# Patient Record
Sex: Female | Born: 1960 | Race: White | Hispanic: No | Smoking: Former smoker
Health system: Southern US, Community
[De-identification: ages and names within clinical notes are randomized; demographics above are authoritative.]

## PROBLEM LIST (undated history)

## (undated) DIAGNOSIS — E119 Type 2 diabetes mellitus without complications: Secondary | ICD-10-CM

## (undated) DIAGNOSIS — M1990 Unspecified osteoarthritis, unspecified site: Secondary | ICD-10-CM

## (undated) DIAGNOSIS — I4891 Unspecified atrial fibrillation: Secondary | ICD-10-CM

## (undated) DIAGNOSIS — J449 Chronic obstructive pulmonary disease, unspecified: Secondary | ICD-10-CM

## (undated) DIAGNOSIS — I10 Essential (primary) hypertension: Secondary | ICD-10-CM

## (undated) DIAGNOSIS — F419 Anxiety disorder, unspecified: Secondary | ICD-10-CM

## (undated) DIAGNOSIS — I5022 Chronic systolic (congestive) heart failure: Secondary | ICD-10-CM

## (undated) DIAGNOSIS — I639 Cerebral infarction, unspecified: Secondary | ICD-10-CM

## (undated) DIAGNOSIS — F32A Depression, unspecified: Secondary | ICD-10-CM

## (undated) DIAGNOSIS — R21 Rash and other nonspecific skin eruption: Secondary | ICD-10-CM

## (undated) DIAGNOSIS — R51 Headache: Secondary | ICD-10-CM

## (undated) DIAGNOSIS — M869 Osteomyelitis, unspecified: Secondary | ICD-10-CM

## (undated) DIAGNOSIS — K219 Gastro-esophageal reflux disease without esophagitis: Secondary | ICD-10-CM

## (undated) DIAGNOSIS — I499 Cardiac arrhythmia, unspecified: Secondary | ICD-10-CM

## (undated) DIAGNOSIS — F329 Major depressive disorder, single episode, unspecified: Secondary | ICD-10-CM

## (undated) DIAGNOSIS — K8020 Calculus of gallbladder without cholecystitis without obstruction: Secondary | ICD-10-CM

## (undated) HISTORY — DX: Chronic obstructive pulmonary disease, unspecified: J44.9

## (undated) HISTORY — DX: Gastro-esophageal reflux disease without esophagitis: K21.9

## (undated) HISTORY — PX: COLONOSCOPY: SHX174

## (undated) HISTORY — PX: ECTOPIC PREGNANCY SURGERY: SHX613

## (undated) HISTORY — PX: CARPAL TUNNEL RELEASE: SHX101

---

## 1997-08-06 ENCOUNTER — Inpatient Hospital Stay (HOSPITAL_COMMUNITY): Admission: AD | Admit: 1997-08-06 | Discharge: 1997-08-08 | Payer: Self-pay | Admitting: *Deleted

## 1998-10-22 ENCOUNTER — Other Ambulatory Visit: Admission: RE | Admit: 1998-10-22 | Discharge: 1998-10-22 | Payer: Self-pay | Admitting: Family Medicine

## 1998-12-08 ENCOUNTER — Other Ambulatory Visit: Admission: RE | Admit: 1998-12-08 | Discharge: 1998-12-08 | Payer: Self-pay | Admitting: Family Medicine

## 1999-10-15 ENCOUNTER — Emergency Department (HOSPITAL_COMMUNITY): Admission: EM | Admit: 1999-10-15 | Discharge: 1999-10-15 | Payer: Self-pay | Admitting: Emergency Medicine

## 2000-01-19 ENCOUNTER — Emergency Department (HOSPITAL_COMMUNITY): Admission: EM | Admit: 2000-01-19 | Discharge: 2000-01-19 | Payer: Self-pay | Admitting: Emergency Medicine

## 2001-09-25 ENCOUNTER — Other Ambulatory Visit: Admission: RE | Admit: 2001-09-25 | Discharge: 2001-09-25 | Payer: Self-pay | Admitting: Family Medicine

## 2001-09-28 ENCOUNTER — Encounter: Admission: RE | Admit: 2001-09-28 | Discharge: 2001-09-28 | Payer: Self-pay | Admitting: Family Medicine

## 2001-09-28 ENCOUNTER — Encounter: Payer: Self-pay | Admitting: Family Medicine

## 2002-03-30 ENCOUNTER — Other Ambulatory Visit: Admission: RE | Admit: 2002-03-30 | Discharge: 2002-03-30 | Payer: Self-pay

## 2002-08-10 ENCOUNTER — Other Ambulatory Visit: Admission: RE | Admit: 2002-08-10 | Discharge: 2002-08-10 | Payer: Self-pay | Admitting: Obstetrics and Gynecology

## 2002-11-27 ENCOUNTER — Emergency Department (HOSPITAL_COMMUNITY): Admission: EM | Admit: 2002-11-27 | Discharge: 2002-11-28 | Payer: Self-pay | Admitting: Emergency Medicine

## 2002-11-28 ENCOUNTER — Encounter: Payer: Self-pay | Admitting: Emergency Medicine

## 2002-12-18 ENCOUNTER — Other Ambulatory Visit: Admission: RE | Admit: 2002-12-18 | Discharge: 2002-12-18 | Payer: Self-pay

## 2003-01-08 ENCOUNTER — Encounter: Admission: RE | Admit: 2003-01-08 | Discharge: 2003-01-08 | Payer: Self-pay

## 2003-05-29 ENCOUNTER — Emergency Department (HOSPITAL_COMMUNITY): Admission: AD | Admit: 2003-05-29 | Discharge: 2003-05-29 | Payer: Self-pay | Admitting: Family Medicine

## 2004-01-19 ENCOUNTER — Emergency Department (HOSPITAL_COMMUNITY): Admission: EM | Admit: 2004-01-19 | Discharge: 2004-01-19 | Payer: Self-pay | Admitting: *Deleted

## 2004-02-02 ENCOUNTER — Emergency Department (HOSPITAL_COMMUNITY): Admission: EM | Admit: 2004-02-02 | Discharge: 2004-02-02 | Payer: Self-pay | Admitting: Emergency Medicine

## 2004-05-29 ENCOUNTER — Emergency Department (HOSPITAL_COMMUNITY): Admission: EM | Admit: 2004-05-29 | Discharge: 2004-05-29 | Payer: Self-pay | Admitting: Family Medicine

## 2004-08-19 ENCOUNTER — Emergency Department (HOSPITAL_COMMUNITY): Admission: EM | Admit: 2004-08-19 | Discharge: 2004-08-19 | Payer: Self-pay | Admitting: Family Medicine

## 2005-01-30 ENCOUNTER — Emergency Department (HOSPITAL_COMMUNITY): Admission: EM | Admit: 2005-01-30 | Discharge: 2005-01-30 | Payer: Self-pay | Admitting: Emergency Medicine

## 2005-02-26 ENCOUNTER — Emergency Department (HOSPITAL_COMMUNITY): Admission: EM | Admit: 2005-02-26 | Discharge: 2005-02-26 | Payer: Self-pay | Admitting: Emergency Medicine

## 2005-09-24 ENCOUNTER — Other Ambulatory Visit: Admission: RE | Admit: 2005-09-24 | Discharge: 2005-09-24 | Payer: Self-pay | Admitting: Obstetrics and Gynecology

## 2005-09-25 ENCOUNTER — Emergency Department (HOSPITAL_COMMUNITY): Admission: EM | Admit: 2005-09-25 | Discharge: 2005-09-25 | Payer: Self-pay | Admitting: Family Medicine

## 2005-11-03 ENCOUNTER — Emergency Department (HOSPITAL_COMMUNITY): Admission: EM | Admit: 2005-11-03 | Discharge: 2005-11-03 | Payer: Self-pay | Admitting: Family Medicine

## 2006-09-04 ENCOUNTER — Emergency Department (HOSPITAL_COMMUNITY): Admission: EM | Admit: 2006-09-04 | Discharge: 2006-09-04 | Payer: Self-pay | Admitting: Emergency Medicine

## 2006-09-11 ENCOUNTER — Emergency Department (HOSPITAL_COMMUNITY): Admission: EM | Admit: 2006-09-11 | Discharge: 2006-09-11 | Payer: Self-pay | Admitting: Family Medicine

## 2006-10-17 ENCOUNTER — Other Ambulatory Visit: Admission: RE | Admit: 2006-10-17 | Discharge: 2006-10-17 | Payer: Self-pay | Admitting: Obstetrics and Gynecology

## 2006-11-14 ENCOUNTER — Emergency Department (HOSPITAL_COMMUNITY): Admission: EM | Admit: 2006-11-14 | Discharge: 2006-11-15 | Payer: Self-pay | Admitting: Emergency Medicine

## 2006-11-15 ENCOUNTER — Encounter: Admission: RE | Admit: 2006-11-15 | Discharge: 2006-11-15 | Payer: Self-pay | Admitting: Obstetrics and Gynecology

## 2006-11-18 ENCOUNTER — Encounter: Admission: RE | Admit: 2006-11-18 | Discharge: 2006-11-18 | Payer: Self-pay | Admitting: Obstetrics and Gynecology

## 2007-04-25 ENCOUNTER — Encounter: Admission: RE | Admit: 2007-04-25 | Discharge: 2007-04-25 | Payer: Self-pay | Admitting: Family Medicine

## 2007-04-26 ENCOUNTER — Encounter: Admission: RE | Admit: 2007-04-26 | Discharge: 2007-04-26 | Payer: Self-pay | Admitting: Family Medicine

## 2007-06-12 ENCOUNTER — Emergency Department (HOSPITAL_COMMUNITY): Admission: EM | Admit: 2007-06-12 | Discharge: 2007-06-12 | Payer: Self-pay | Admitting: Family Medicine

## 2008-05-02 ENCOUNTER — Other Ambulatory Visit: Admission: RE | Admit: 2008-05-02 | Discharge: 2008-05-02 | Payer: Self-pay | Admitting: Obstetrics and Gynecology

## 2008-06-01 ENCOUNTER — Emergency Department (HOSPITAL_COMMUNITY): Admission: EM | Admit: 2008-06-01 | Discharge: 2008-06-01 | Payer: Self-pay | Admitting: Family Medicine

## 2008-09-24 ENCOUNTER — Emergency Department (HOSPITAL_COMMUNITY): Admission: EM | Admit: 2008-09-24 | Discharge: 2008-09-25 | Payer: Self-pay | Admitting: Emergency Medicine

## 2008-10-26 ENCOUNTER — Emergency Department (HOSPITAL_COMMUNITY): Admission: EM | Admit: 2008-10-26 | Discharge: 2008-10-26 | Payer: Self-pay | Admitting: Emergency Medicine

## 2008-12-08 ENCOUNTER — Inpatient Hospital Stay (HOSPITAL_COMMUNITY): Admission: AD | Admit: 2008-12-08 | Discharge: 2008-12-08 | Payer: Self-pay | Admitting: Obstetrics and Gynecology

## 2009-01-08 ENCOUNTER — Emergency Department (HOSPITAL_COMMUNITY): Admission: EM | Admit: 2009-01-08 | Discharge: 2009-01-08 | Payer: Self-pay | Admitting: Family Medicine

## 2009-01-18 ENCOUNTER — Inpatient Hospital Stay (HOSPITAL_COMMUNITY): Admission: AD | Admit: 2009-01-18 | Discharge: 2009-01-19 | Payer: Self-pay | Admitting: Obstetrics and Gynecology

## 2009-01-30 ENCOUNTER — Encounter: Admission: RE | Admit: 2009-01-30 | Discharge: 2009-01-30 | Payer: Self-pay | Admitting: Obstetrics and Gynecology

## 2009-01-30 ENCOUNTER — Observation Stay (HOSPITAL_COMMUNITY): Admission: EM | Admit: 2009-01-30 | Discharge: 2009-01-31 | Payer: Self-pay | Admitting: Emergency Medicine

## 2009-01-31 ENCOUNTER — Inpatient Hospital Stay (HOSPITAL_COMMUNITY): Admission: EM | Admit: 2009-01-31 | Discharge: 2009-02-04 | Payer: Self-pay | Admitting: Emergency Medicine

## 2009-02-04 ENCOUNTER — Encounter (INDEPENDENT_AMBULATORY_CARE_PROVIDER_SITE_OTHER): Payer: Self-pay | Admitting: Internal Medicine

## 2009-02-22 ENCOUNTER — Ambulatory Visit: Payer: Self-pay | Admitting: Cardiology

## 2009-02-23 ENCOUNTER — Inpatient Hospital Stay (HOSPITAL_COMMUNITY): Admission: EM | Admit: 2009-02-23 | Discharge: 2009-02-25 | Payer: Self-pay | Admitting: Emergency Medicine

## 2009-02-25 ENCOUNTER — Emergency Department (HOSPITAL_COMMUNITY): Admission: EM | Admit: 2009-02-25 | Discharge: 2009-02-26 | Payer: Self-pay | Admitting: Emergency Medicine

## 2009-03-05 ENCOUNTER — Encounter: Admission: RE | Admit: 2009-03-05 | Discharge: 2009-03-05 | Payer: Self-pay | Admitting: Family Medicine

## 2009-04-05 ENCOUNTER — Inpatient Hospital Stay (HOSPITAL_COMMUNITY): Admission: EM | Admit: 2009-04-05 | Discharge: 2009-04-07 | Payer: Self-pay | Admitting: Emergency Medicine

## 2009-05-16 ENCOUNTER — Emergency Department (HOSPITAL_COMMUNITY): Admission: EM | Admit: 2009-05-16 | Discharge: 2009-05-16 | Payer: Self-pay | Admitting: Emergency Medicine

## 2009-05-29 ENCOUNTER — Emergency Department (HOSPITAL_COMMUNITY): Admission: EM | Admit: 2009-05-29 | Discharge: 2009-05-29 | Payer: Self-pay | Admitting: Emergency Medicine

## 2009-07-15 ENCOUNTER — Emergency Department (HOSPITAL_COMMUNITY): Admission: EM | Admit: 2009-07-15 | Discharge: 2009-07-15 | Payer: Self-pay | Admitting: Emergency Medicine

## 2009-10-01 ENCOUNTER — Emergency Department (HOSPITAL_COMMUNITY): Admission: EM | Admit: 2009-10-01 | Discharge: 2009-10-01 | Payer: Self-pay | Admitting: Emergency Medicine

## 2009-12-08 ENCOUNTER — Emergency Department (HOSPITAL_COMMUNITY): Admission: EM | Admit: 2009-12-08 | Discharge: 2009-12-08 | Payer: Self-pay | Admitting: Emergency Medicine

## 2009-12-22 ENCOUNTER — Encounter: Admission: RE | Admit: 2009-12-22 | Discharge: 2010-02-18 | Payer: Self-pay | Admitting: Orthopedic Surgery

## 2010-02-02 ENCOUNTER — Encounter: Admission: RE | Admit: 2010-02-02 | Discharge: 2010-02-02 | Payer: Self-pay | Admitting: Obstetrics and Gynecology

## 2010-02-05 ENCOUNTER — Encounter: Admission: RE | Admit: 2010-02-05 | Discharge: 2010-02-05 | Payer: Self-pay | Admitting: Obstetrics and Gynecology

## 2010-02-12 ENCOUNTER — Other Ambulatory Visit: Admission: RE | Admit: 2010-02-12 | Discharge: 2010-02-12 | Payer: Self-pay | Admitting: Obstetrics and Gynecology

## 2010-02-21 ENCOUNTER — Emergency Department (HOSPITAL_COMMUNITY): Admission: EM | Admit: 2010-02-21 | Discharge: 2010-02-21 | Payer: Self-pay | Admitting: Emergency Medicine

## 2010-03-10 ENCOUNTER — Emergency Department (HOSPITAL_COMMUNITY): Admission: EM | Admit: 2010-03-10 | Discharge: 2010-03-11 | Payer: Self-pay | Admitting: Emergency Medicine

## 2010-03-24 ENCOUNTER — Encounter: Admission: RE | Admit: 2010-03-24 | Discharge: 2010-03-24 | Payer: Self-pay | Admitting: Family Medicine

## 2010-04-20 ENCOUNTER — Emergency Department (HOSPITAL_COMMUNITY): Admission: EM | Admit: 2010-04-20 | Discharge: 2010-04-20 | Payer: Self-pay | Admitting: Emergency Medicine

## 2010-06-27 ENCOUNTER — Emergency Department (HOSPITAL_COMMUNITY)
Admission: EM | Admit: 2010-06-27 | Discharge: 2010-06-27 | Payer: Self-pay | Source: Home / Self Care | Admitting: Emergency Medicine

## 2010-07-19 ENCOUNTER — Encounter: Payer: Self-pay | Admitting: Obstetrics and Gynecology

## 2010-10-01 LAB — URINALYSIS, ROUTINE W REFLEX MICROSCOPIC
Glucose, UA: 100 mg/dL — AB
Nitrite: NEGATIVE
Protein, ur: NEGATIVE mg/dL
Urobilinogen, UA: 1 mg/dL (ref 0.0–1.0)

## 2010-10-01 LAB — COMPREHENSIVE METABOLIC PANEL
AST: 15 U/L (ref 0–37)
Alkaline Phosphatase: 73 U/L (ref 39–117)
BUN: 4 mg/dL — ABNORMAL LOW (ref 6–23)
CO2: 26 mEq/L (ref 19–32)
Chloride: 105 mEq/L (ref 96–112)
Creatinine, Ser: 0.53 mg/dL (ref 0.4–1.2)
GFR calc Af Amer: >60 mL/min (ref >60)
GFR calc non Af Amer: >60 mL/min (ref >60)
Total Bilirubin: 0.5 mg/dL (ref 0.3–1.2)

## 2010-10-01 LAB — POCT I-STAT, CHEM 8
HCT: 41 % (ref 36.0–46.0)
Hemoglobin: 13.9 g/dL (ref 12.0–15.0)
Sodium: 138 mEq/L (ref 135–145)
TCO2: 26 mmol/L (ref 0–100)

## 2010-10-01 LAB — DIFFERENTIAL
Basophils Relative: 2 % — ABNORMAL HIGH (ref 0–1)
Eosinophils Absolute: 0.4 10*3/uL (ref 0.0–0.7)
Lymphs Abs: 2.1 10*3/uL (ref 0.7–4.0)
Neutrophils Relative %: 74 % (ref 43–77)

## 2010-10-01 LAB — CBC
HCT: 37.9 % (ref 36.0–46.0)
MCHC: 32.2 g/dL (ref 30.0–36.0)
MCV: 76.4 fL — ABNORMAL LOW (ref 78.0–100.0)
MCV: 78 fL (ref 78.0–100.0)
Platelets: 180 10*3/uL (ref 150–400)
Platelets: 194 10*3/uL (ref 150–400)
RBC: 4.73 MIL/uL (ref 3.87–5.11)
RBC: 4.86 MIL/uL (ref 3.87–5.11)
WBC: 12.5 10*3/uL — ABNORMAL HIGH (ref 4.0–10.5)
WBC: 12.6 10*3/uL — ABNORMAL HIGH (ref 4.0–10.5)

## 2010-10-01 LAB — CULTURE, BLOOD (ROUTINE X 2)

## 2010-10-03 LAB — CBC
HCT: 34.7 % — ABNORMAL LOW (ref 36.0–46.0)
HCT: 35.9 % — ABNORMAL LOW (ref 36.0–46.0)
HCT: 34.5 % — ABNORMAL LOW (ref 36.0–46.0)
Hemoglobin: 11.2 g/dL — ABNORMAL LOW (ref 12.0–15.0)
Hemoglobin: 13 g/dL (ref 12.0–15.0)
MCHC: 31.7 g/dL (ref 30.0–36.0)
MCHC: 32 g/dL (ref 30.0–36.0)
MCHC: 32.2 g/dL (ref 30.0–36.0)
MCHC: 31.8 g/dL (ref 30.0–36.0)
MCHC: 32 g/dL (ref 30.0–36.0)
MCV: 77.2 fL — ABNORMAL LOW (ref 78.0–100.0)
MCV: 77.2 fL — ABNORMAL LOW (ref 78.0–100.0)
MCV: 77.5 fL — ABNORMAL LOW (ref 78.0–100.0)
MCV: 78 fL (ref 78.0–100.0)
Platelets: 168 10*3/uL (ref 150–400)
Platelets: 135 10*3/uL — ABNORMAL LOW (ref 150–400)
Platelets: 137 10*3/uL — ABNORMAL LOW (ref 150–400)
Platelets: 157 10*3/uL (ref 150–400)
RBC: 4.48 MIL/uL (ref 3.87–5.11)
RBC: 4.65 MIL/uL (ref 3.87–5.11)
RBC: 5.17 MIL/uL — ABNORMAL HIGH (ref 3.87–5.11)
RBC: 5.27 MIL/uL — ABNORMAL HIGH (ref 3.87–5.11)
RBC: 4.41 MIL/uL (ref 3.87–5.11)
RDW: 16.9 % — ABNORMAL HIGH (ref 11.5–15.5)
RDW: 17.1 % — ABNORMAL HIGH (ref 11.5–15.5)
WBC: 6.6 10*3/uL (ref 4.0–10.5)
WBC: 7.4 10*3/uL (ref 4.0–10.5)
WBC: 8.4 10*3/uL (ref 4.0–10.5)
WBC: 9.4 10*3/uL (ref 4.0–10.5)

## 2010-10-03 LAB — BASIC METABOLIC PANEL
BUN: 10 mg/dL (ref 6–23)
BUN: 6 mg/dL (ref 6–23)
BUN: 9 mg/dL (ref 6–23)
CO2: 25 mEq/L (ref 19–32)
CO2: 25 mEq/L (ref 19–32)
CO2: 25 mEq/L (ref 19–32)
CO2: 25 mEq/L (ref 19–32)
Calcium: 9.1 mg/dL (ref 8.4–10.5)
Calcium: 9.1 mg/dL (ref 8.4–10.5)
Chloride: 106 mEq/L (ref 96–112)
Chloride: 106 mEq/L (ref 96–112)
Chloride: 103 mEq/L (ref 96–112)
Chloride: 108 mEq/L (ref 96–112)
Creatinine, Ser: 0.6 mg/dL (ref 0.4–1.2)
Creatinine, Ser: 0.57 mg/dL (ref 0.4–1.2)
Creatinine, Ser: 0.61 mg/dL (ref 0.4–1.2)
GFR calc Af Amer: >60 mL/min (ref >60)
GFR calc Af Amer: >60 mL/min (ref >60)
GFR calc Af Amer: >60 mL/min (ref >60)
GFR calc Af Amer: >60 mL/min (ref >60)
GFR calc non Af Amer: >60 mL/min (ref >60)
Glucose, Bld: 190 mg/dL — ABNORMAL HIGH (ref 70–99)
Glucose, Bld: 240 mg/dL — ABNORMAL HIGH (ref 70–99)
Potassium: 3.8 mEq/L (ref 3.5–5.1)
Potassium: 3.2 mEq/L — ABNORMAL LOW (ref 3.5–5.1)
Sodium: 139 mEq/L (ref 135–145)
Sodium: 140 mEq/L (ref 135–145)
Sodium: 136 mEq/L (ref 135–145)

## 2010-10-03 LAB — CARDIAC PANEL(CRET KIN+CKTOT+MB+TROPI)
CK, MB: 1.1 ng/mL (ref 0.3–4.0)
CK, MB: 1.2 ng/mL (ref 0.3–4.0)
Relative Index: INVALID (ref 0.0–2.5)
Relative Index: INVALID (ref 0.0–2.5)
Relative Index: INVALID (ref 0.0–2.5)
Total CK: 32 U/L (ref 7–177)
Total CK: 26 U/L (ref 7–177)
Total CK: 31 U/L (ref 7–177)
Troponin I: 0.01 ng/mL (ref 0.00–0.06)
Troponin I: 0.02 ng/mL (ref 0.00–0.06)

## 2010-10-03 LAB — POCT CARDIAC MARKERS
CKMB, poc: <1 ng/mL — ABNORMAL LOW (ref 1.0–8.0)
CKMB, poc: <1 ng/mL — ABNORMAL LOW (ref 1.0–8.0)
Myoglobin, poc: 44.6 ng/mL (ref 12–200)
Myoglobin, poc: 51.7 ng/mL (ref 12–200)
Troponin i, poc: <0.05 ng/mL (ref 0.00–0.09)

## 2010-10-03 LAB — T4, FREE: Free T4: 0.88 ng/dL (ref 0.80–1.80)

## 2010-10-03 LAB — DIFFERENTIAL
Basophils Absolute: 0 10*3/uL (ref 0.0–0.1)
Basophils Relative: 0 % (ref 0–1)
Eosinophils Relative: 0 % (ref 0–5)
Lymphocytes Relative: 34 % (ref 12–46)
Lymphocytes Relative: 6 % — ABNORMAL LOW (ref 12–46)
Lymphs Abs: 2.9 10*3/uL (ref 0.7–4.0)
Monocytes Absolute: 0.5 10*3/uL (ref 0.1–1.0)
Monocytes Relative: 6 % (ref 3–12)
Neutro Abs: 4.6 10*3/uL (ref 1.7–7.7)
Neutro Abs: 10.4 10*3/uL — ABNORMAL HIGH (ref 1.7–7.7)
Neutrophils Relative %: 55 % (ref 43–77)

## 2010-10-03 LAB — GLUCOSE, CAPILLARY
Glucose-Capillary: 209 mg/dL — ABNORMAL HIGH (ref 70–99)
Glucose-Capillary: 238 mg/dL — ABNORMAL HIGH (ref 70–99)
Glucose-Capillary: 391 mg/dL — ABNORMAL HIGH (ref 70–99)

## 2010-10-03 LAB — HEPARIN LEVEL (UNFRACTIONATED)
Heparin Unfractionated: 0.44 IU/mL (ref 0.30–0.70)
Heparin Unfractionated: 0.53 IU/mL (ref 0.30–0.70)

## 2010-10-03 LAB — BRAIN NATRIURETIC PEPTIDE: Pro B Natriuretic peptide (BNP): <30 pg/mL (ref 0.0–100.0)

## 2010-10-03 LAB — PROTIME-INR: Prothrombin Time: 13.3 seconds (ref 11.6–15.2)

## 2010-10-03 LAB — T3: T3, Total: 84.8 ng/dl (ref 80.0–204.0)

## 2010-10-03 LAB — TSH: TSH: 0.151 u[IU]/mL — ABNORMAL LOW (ref 0.350–4.500)

## 2010-10-03 LAB — PREGNANCY, URINE: Preg Test, Ur: NEGATIVE

## 2010-10-04 LAB — URINALYSIS, ROUTINE W REFLEX MICROSCOPIC
Glucose, UA: NEGATIVE mg/dL
Hgb urine dipstick: NEGATIVE
Ketones, ur: NEGATIVE mg/dL
Protein, ur: 30 mg/dL — AB
pH: 5.5 (ref 5.0–8.0)

## 2010-10-04 LAB — POCT RAPID STREP A (OFFICE): Streptococcus, Group A Screen (Direct): NEGATIVE

## 2010-10-04 LAB — URINE MICROSCOPIC-ADD ON: RBC / HPF: NONE SEEN RBC/hpf (ref <3)

## 2010-10-05 LAB — CBC
Hemoglobin: 13 g/dL (ref 12.0–15.0)
MCHC: 33.2 g/dL (ref 30.0–36.0)
Platelets: 150 10*3/uL (ref 150–400)
RDW: 19.6 % — ABNORMAL HIGH (ref 11.5–15.5)

## 2010-10-05 LAB — POCT PREGNANCY, URINE: Preg Test, Ur: NEGATIVE

## 2010-10-06 LAB — URINALYSIS, ROUTINE W REFLEX MICROSCOPIC
Glucose, UA: NEGATIVE mg/dL
Hgb urine dipstick: NEGATIVE
Ketones, ur: NEGATIVE mg/dL
pH: 6 (ref 5.0–8.0)

## 2010-10-06 LAB — CBC
HCT: 38.5 % (ref 36.0–46.0)
Hemoglobin: 12.6 g/dL (ref 12.0–15.0)
MCV: 75.3 fL — ABNORMAL LOW (ref 78.0–100.0)
RDW: 19.2 % — ABNORMAL HIGH (ref 11.5–15.5)

## 2010-10-06 LAB — URINE CULTURE

## 2010-10-06 LAB — POCT CARDIAC MARKERS
CKMB, poc: <1 ng/mL — ABNORMAL LOW (ref 1.0–8.0)
Troponin i, poc: <0.05 ng/mL (ref 0.00–0.09)

## 2010-11-10 NOTE — Discharge Summary (Signed)
[name], [name]                ACCOUNT NO.:  [id number]   MEDICAL RECORD NO.:  [id number]          PATIENT TYPE:  INP   LOCATION:  5531                         FACILITY:  [facility]   PHYSICIAN:  Kela Millin, M.D.DATE OF BIRTH:  Oct 25, 1960   DATE OF ADMISSION:  01/31/2009  DATE OF DISCHARGE:  02/04/2009                               DISCHARGE SUMMARY   ADMITTING PHYSICIAN:  Gordy Savers, MD   CONSULTANTS:  Francisca December, MD, Cardiology.   CHIEF COMPLAINT/REASON FOR ADMISSION:  Ms. Cornman is a 50 year old  female patient with history of tobacco abuse for 20 years and no other  history of COPD or asthma.  Two weeks prior to presentation, she  developed paroxysmal coughing associated with chest congestion and  refractory nonproductive cough.  Initially, this symptomatology was not  associated with chest pain or shortness of breath.  Over the past  several days, the cough has worsened and associated with anterior chest  wall pain with nonproductive cough.  No fevers, no chills.  Subsequently, developing wheezing and mild shortness of breath.  She  presented to the ER 24 hours prior to admission and was given an  injection of steroids and albuterol metered-dose inhaler.  She had  previously been treated as an outpatient with oral azithromycin.  She  saw her primary care physician 2 days prior to admission and was  retreated with azithromycin.  She had just been discharged from the  hospital earlier on the date of admission, i.e. at 6 a.m. and was  stable, but by 5:30 that same afternoon, she represented back to the  hospital where she was evaluated by the EDP and then subsequently, Dr.  Amador Cunas.  She was aggressively IV fluid hydrated.  Bronchodilators  were given via inhaler and she was given IV steroids.  She continued to  have refractory cough and persistent wheezing; therefore, was admitted  by Dr. Amador Cunas.  Upon initial evaluation, she was afebrile, she  was  non-hypertensive, pulse was 110, respiratory rate 24, O2 sat 98% on room  air.  She was found to be without peripheral edema.  She had a soft  grade 1-2/6 systolic murmur.  She was noted to have coarse scattered  rhonchi and expiratory wheezing bilaterally, no resting tachypnea.  She  was admitted by Dr. Amador Cunas with the following diagnoses.  1. Acute exacerbation of asthmatic bronchitis.  2. Tobacco abuse.   HOSPITAL COURSE:  The patient was admitted to the general floor  hospital, was started on IV fluids, IV steroids, and initially started  on azithromycin and this was later changed to Avelox.  She was also  started on Xopenex nebs and guaifenesin antitussive medications.   Early in the hospitalization, the patient was complaining of  palpitations, which became progressively worse.  She was placed on a  telemetry monitor and was noted to have some sinus tachycardia.  In  addition, over a period of day, she was also found to have asymptomatic  bradycardia with heart rates down into the mid to upper 30s.   From respiratory standpoint, she improved with administration  of  antibiotics and steroids.  She was eventually transitioned over to oral  steroids and oral antibiotics.  She was able to slowly increase her  activity level without reproduction of paroxysmal coughing or wheezing.  She was otherwise resumed on her usual home medications.   She continued to have complaints of tachy palpitations as evidenced by  occasional tachycardia, but mostly, she remained in bradycardic state,  especially with sleep.  The bradycardia was asymptomatic.  Because of  these issues, TSH was checked, this was 0.151.  The patient had no other  external signs of hyperthyroidism such as exophthalmos or skin or hair  changes.  Free T4 was checked and this was normal at 0.88.  T3 was  checked and it was 84.8.  Because she was also complaining of some  transient chest pain, which was probably  musculoskeletal in nature,  cardiac isoenzymes have been checked early in hospitalization and these  were normal.  Because of the tachycardia, 2D echocardiogram has been  ordered this hospitalization and at time of dictation, this had not been  read by a physician.   On date of dictation, Dr. Amil Amen of Cardiology also evaluated the  patient because of the bradycardia.  He will follow up on the 2D echo  and he has arranged for outpatient 24-hour monitor and his discussion  with her, the patient has apparently had symptomatic tachycardia, which  now is evidenced this bradycardia.  She has had tachy palpitations and  lightheadedness with one episode of near syncope, this has been ongoing  for about 4-6 months; therefore, warranting the outpatient Holter  monitor.  He feels at this time she does not have any evidence to  warrant further ischemic workup such as stress test and he recommends  her to follow up in the office for additional workup.  The patient has  already arranged for that appointment prior to discharge.   In regards to her pulmonary issues, on date of discharge, the patient  was ambulating in the hall without shortness of breath and paroxysmal  coughing.  She has been transitioned over to oral Avelox as previously  mentioned and will continue this for 3 more days.  She has been  transitioned over to oral prednisone and as of today, she was on 20  b.i.d. and we will change this to 40 daily and transition down by 10 mg  every 3 days until she has completed the dose of a total of 10 tablets.  She also has been given over-the-counter guaifenesin recommendations and  Prilosec prescription to use as well for any reflux-type symptoms.  Dr.  Suanne Marker also recommends repeating TSH in 4-6 weeks to determine if this  has returned to a normal baseline.  Otherwise, additional workup will be  indicated with the patient's primary care physician, Dr. Lupe Carney.   FINAL DISCHARGE  DIAGNOSES:  1. Acute asthmatic bronchitis improved without evidence of pneumonia.  2. Tachy palpitations and bradycardia.  Outpatient cardiac workup      pending.  3. Tobacco abuse.  The patient reports, will stop as of this      admission.  4. Low TSH with normal T3-T4, probably acute phase reactant.  5. History of anxiety disorder and depression on medications prior to      admission.   DISCHARGE MEDICATIONS:  The patient will resume the following home  medications.  1. Xanax 0.25 mg p.r.n. anxiety.  2. Prozac 20 mg daily.  3. Prempro 0.3 mg daily.  New medications include,  1. Prilosec 20 mg daily.  2. Over-the-counter guaifenesin/Robitussin cough.  3. Avelox 400 mg daily for the next 3 days.  4. Prednisone taper 10 mg tablets, instructions to take 4 tabs daily      for 3 days and 3 tabs daily for 3 days, and 2 tabs daily for 3      days, and 1 tab daily for 3 days, then to stop.   Return for work in 2 weeks.   DIET:  Avoid dairy products until stomach improves.   ACTIVITY:  Otherwise, no restrictions.  I did caution the patient about  driving or operating any type of heavy machinery when experiencing  palpitations or dizziness as she has had near-syncopal episode.   FOLLOWUP:  She needs to call her primary care physician, Dr. Clovis Riley in  2 weeks to be seen.  In addition, she needs to follow up with Dr.  Amil Amen as instructed or contact person to assure, their telephone  number is [phone].  In addition, she has been given instructions from  the smoking cessation person here at the hospital, their telephone  number is [phone].      Allison L. Rennis Harding, N.P.      Kela Millin, M.D.  Electronically Signed    ALE/MEDQ  D:  02/04/2009  T:  02/05/2009  Job:  [id number]   cc:   Francisca December, M.D.  Elsworth Soho, M.D.

## 2010-11-10 NOTE — H&P (Signed)
NAMESEVANNAH, [name]                ACCOUNT NO.:  [id number]   MEDICAL RECORD NO.:  [id number]           PATIENT TYPE:   LOCATION:                                FACILITY:  [facility]   PHYSICIAN:  Charles A. Delcambre, MDDATE OF BIRTH:  10-09-1960   DATE OF ADMISSION:  12/07/2006  DATE OF DISCHARGE:                              HISTORY & PHYSICAL   She is admitted June 11 to undergo endometrial ablation, planned  ThermaChoice.  If failure of Thermachoice, may proceed with rollerball  ablation.  She is a 50 year old gravida 94, para 3-0-10-3 with  menorrhagia and desiring ablation.   PAST MEDICAL HISTORY:  Abnormal Pap with LEEP done by Dr. Galen Daft showing  moderate dysplasia, positive endocervical margins; this was back in  2003.  Negative Pap in October 2003, negative patient February 2004,  ASCUS Pap June 2005, and then a Pap smear October 17, 2006, negative.  Recently treated for positive Staph aureus methicillin-resistant lesions  around a tattoo area on her leg with, I believe, doxycycline, Dr.  Clovis Riley for 2 weeks.   SURGICAL HISTORY:  LEEP only.   MEDICATIONS:  Prozac 20 mg a day; Xanax at a nonspecified dose p.r.n.,  rarely used.   ALLERGIES:  PENICILLIN, ASPIRIN, SULFA, CODEINE.  REACTIONS  NONSPECIFIED.   SOCIAL HISTORY:  Smoking about quarter-pack a day of cigarettes at this  time.  She denies alcohol use, drug use, or STD exposure in the past  other than she has had Trichomonas treated.  She is back together with  her husband.   FAMILY HISTORY:  Father is deceased at age 63 from cancer, nonspecified;  he had diabetes also.  Mother in mid 100s with hypertension and diabetes.  Sister with breast cancer, onset age 104 as noted.  Otherwise, no family  history of uterus, ovaries, cervix, colon cancer, lymphoma, coronary  artery disease, or stroke.   REVIEW OF SYSTEMS:  Denies fever or chills.  No rashes or lesions,  headaches, dizziness.  Some seasonal allergies.  No chest  pain,  shortness of breath, wheezing, diarrhea, constipation, bleeding, melena,  hematochezia, urgency, frequency, dysuria, incontinence, hematuria,  __________  or emotional changes.   PHYSICAL EXAMINATION:  GENERAL:  As noted.  VITALS:  As noted.  NEUROLOGIC:  Alert and oriented x3.  HEENT EXAM:  Grossly within normal limits.  NECK:  Supple without thyromegaly or adenopathy.  LUNGS:  Clear bilaterally.  BACK:  No CVAT, vertebral column nontender to palpation.  BREASTS:  No masses, tenderness, discharge, skin or nipple changes  bilaterally.  ABDOMEN:  Soft, flat, nontender.  No hepatosplenomegaly or masses noted.  HEART:  Regular rate and rhythm without murmur, rub, or gallop.  PELVIC EXAM:  Normal external female genitalia, Bartholin, urethral, or  Skene's within normal limits.  Vault without discharge, multiparus  cervix is noted and uterus is not enlarged.  Adnexa nontender without  masses.  Ovaries are  within normal size bilaterally.  RECTAL:  Exam is not done.  Anus and perineal body appeared normal.   ASSESSMENT:  Menorrhagia.   PLAN:  ThermaChoice endometrial ablation  or resorting to rolloeball  endometrial ablation if necessary.  All questions were answered, failed  procedure, eumenorrhea for 80-90%, amenorrhea for 30-40%.  Quotes were  given per my experience, ThermaChoice.  All questions were answered.  She accepts risks on uterine perforation, damage to bowel or bladder.  She will remain n.p.o. past midnight evening prior to surgery.  Preoperative CBC and serum hCG will be obtained prior to surgery and we  will proceed as outlined.      Charles A. Sydnee Cabal, MD  Electronically Signed     CAD/MEDQ  D:  11/30/2006  T:  12/01/2006  Job:  [id number]

## 2010-11-10 NOTE — Cardiovascular Report (Signed)
NAMEADDYSIN, [name]                ACCOUNT NO.:  [id number]   MEDICAL RECORD NO.:  [id number]          PATIENT TYPE:  INP   LOCATION:  2003                         FACILITY:  [facility]   PHYSICIAN:  Corky Crafts, MDDATE OF BIRTH:  09/03/1960   DATE OF PROCEDURE:  DATE OF DISCHARGE:                            CARDIAC CATHETERIZATION   REFERRING PHYSICIAN:  Francisca December, MD   PROCEDURE PERFORMED:  Left heart catheterization, left ventriculogram,  coronary angiogram, and abdominal aortogram.   OPERATOR:  Corky Crafts, MD   INDICATIONS:  Unstable angina.   PROCEDURE:  The risks and benefits of cardiac catheterization were  explained to the patient and informed consent was obtained.  She was  brought to the cath lab.  She was prepped and draped in the usual  sterile fashion.  Right groin was infiltrated with 1% lidocaine.  A 5-  French sheath was placed into the right femoral artery using the  modified Seldinger technique.  Left coronary artery angiography was  performed using a JL-4 pigtail catheter.  The catheter was advanced to  the vessel ostium under fluoroscopic guidance.  Digital angiography was  performed in multiple projections using hand injection contrast.  Right  coronary artery angiography was performed in a similar fashion with a JR-  4.0 catheter.  A pigtail catheter was advanced to the ascending aorta  and across the aortic valve under fluoroscopic guidance.  Power  injection of contrast was performed in the RAO projection to image the  left ventricle.  Catheter was pulled back under continuous hemodynamic  pressure monitoring.  Catheter was withdrawn to the abdominal aorta.  Power injection of contrast was performed in the AP projection.  The  sheath was removed using manual compression.   FINDINGS:  The left main was a short vessel and widely patent.  The left circumflex was a small vessel with mild irregularities and a  proximal vessel.  The OM-1 was  a medium-sized vessel.  Left anterior descending was a large vessel proximally with mild  irregularities.  There was a large first diagonal.  The distal LAD was a  small vessel but widely patent.  The right coronary artery was a large dominant vessel and appeared  angiographically normal.  The PLA and PDA were widely patent.  Left ventriculogram showed normal left ventricular function with  ejection fraction of 55-60%.   HEMODYNAMICS:  Left ventricular pressure 149/5 with an LVEDP of 11 mmHg.  Aortic pressure 147/85 with a mean aortic pressure of 111 mmHg.  The abdominal aortogram showed no abdominal aortic aneurysm.  Bilateral  renal arteries were widely patent.   IMPRESSION:  1. No significant coronary artery disease.  2. Normal left ventricular function.  3. No abdominal aortic aneurysm.   RECOMMENDATIONS:  I recommend smoking cessation along with other  aggressive primary prevention.      Corky Crafts, MD  Electronically Signed     JSV/MEDQ  D:  02/24/2009  T:  02/25/2009  Job:  [id number]

## 2010-11-10 NOTE — Consult Note (Signed)
NAMEARIELY, [name]                ACCOUNT NO.:  [id number]   MEDICAL RECORD NO.:  [id number]          PATIENT TYPE:  INP   LOCATION:  5531                         FACILITY:  [facility]   PHYSICIAN:  Francisca December, M.D.  DATE OF BIRTH:  [birth year]   DATE OF CONSULTATION:  02/04/2009  DATE OF DISCHARGE:  02/04/2009                                 CONSULTATION   REQUESTING PHYSICIAN:  Kela Millin, MD   PRIMARY CARE PHYSICIAN:  L. Lupe Carney, MD   REASON FOR CONSULTATION:  Bradycardia/arrhythmia.   HISTORY OF PRESENT ILLNESS:  [name] is a pleasant 50 year old  woman who was admitted on January 31, 2009, with bronchitis, asthma, and  persistent coughing.  She had been evaluated 24 hours earlier in the  emergency room and sent home on antibiotics only.  The cough worsened,  she was unable to control this, and she returned to the hospital.  I do  have a comment from Dr. Crista Curb on February 01, 2009, that the  patient represented because of palpitations rather than worsening  shortness of breath or cough.  I could not confirm that with the patient  this morning.  While hospitalized, she slowly improved from a pulmonary  standpoint.  She does recall having an episode of tachy palpitation at  approximately 1800 on February 01, 2009.  It feels like a fluttering in her  chest.  It persisted for a minute or two.  She has noticed this  frequently over the past 6 months.  They have been associated with some  lightheadedness and on one episode, near syncope, but she did not  completely black out.  She has also had single palpitations which are  occurring once or twice a day.  Finally, nursing and telemetry have  noted rather marked episodes of bradycardia down into the high 30 beat  per minute range.  The patient is always at rest or at sleep when this  occurs and is asymptomatic.   She has no prior cardiac history.  Never been told she had a heart  murmur.  She was always active  as a child.   PAST MEDICAL HISTORY:  1. Tobacco abuse.  2. Intermittent acute, becoming chronic, bronchitis.  3. Asthma.  4. Anxiety/depression.  5. Postmenopausal syndrome.   CURRENT MEDICATIONS:  1. Enoxaparin 40 mg subcu daily.  2. Guaifenesin 1200 mg p.o. b.i.d.  3. Oxycodone.  4. Chlorpheniramine (Tussionex) 1 teaspoon p.o. b.i.d.  5. Fluoxetine 20 mg p.o. daily.  6. Conjugated estrogen 0.3 mg p.o. daily.  7. Medroxyprogesterone 1.25 mg p.o. daily.  8. Lomefloxacin 400 mg p.o. daily.  9. Prednisone 40 mg p.o. b.i.d.  10.Protonix 40 mg p.o. daily.  11.Nicotine patch 14 mg per hour.  12.Xopenex 0.63 mg inhaled solution q.i.d.   She is taking Xanax, Prozac, and Prempro as an outpatient.   ALLERGIES:  1. ASPIRIN.  2. BIAXIN.  3. CODEINE.  4. PENICILLIN.  5. SULFA.   FAMILY HISTORY:  Father died at age 15 of COPD.  He had bypass surgery  and valvular disease in his 65s.  Mother is age 24, has dementia and  diabetes mellitus type 2.  Two brothers have thyroid disease.   SOCIAL HISTORY:  Smokes about a pack a day.  Does not drink.  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   REVIEW OF SYSTEMS:  Negative except as mentioned above.   PHYSICAL EXAMINATION:  VITAL SIGNS:  The blood pressure is 120/74, pulse  is 64 and regular, respiratory rate is 18, and temperature 98.2.  GENERAL:  This is a well-appearing 50 year old Caucasian female in no  distress, pleasant, conversive, and oriented.  HEENT:  Unremarkable.  Head is atraumatic and normocephalic.  The pupils  are equal, round, and reactive to light.  Sclerae are anicteric.  Oral  mucosa is pink and moist.  Teeth and gums in good repair.  NECK:  Supple without thyromegaly or masses.  The carotid upstrokes are  normal.  There is no bruit.  There is no JVD.  CHEST:  Clear with adequate excursion.  HEART:  Regular rhythm.  Normal S1 and S2 are heard.  No murmur, click,  or rub.  ABDOMEN:  Soft, nontender.  No midline pulsatile mass.   Bowel sounds  present in all quadrants.  EXTERNAL GENITALIA:  Not performed.  RECTAL:  Not performed.  EXTREMITIES:  Full range of motion.  No edema.  Intact distal pulses.  NEUROLOGICAL:  Cranial nerves II through XII are intact.  Motor and  sensory grossly intact.  Gait not tested.  SKIN:  Warm, dry, and clear.   ACCESSORY CLINICAL DATA:  Admission hemogram is normal.  Serum  electrolytes are normal.  Glucose 162.  BUN and creatinine normal.  CBGs  in the hospital are varied, as high as 391 to as low as 183.  Potassium  fell as low as 3.2 on February 04, 2009.  CK-MB and troponin are negative  x2.  TSH is 0.151 with a free T4 of 0.88 and a T3 of 84.3, both within  normal range.   ASSESSMENT:  1. Supraventricular tachycardia, intermittently symptomatic, lasting      not more than 2-4 minutes.  2. Asymptomatic sinus bradycardia during rest and sleep.  3. Low TSH with normal T3 and T4.  4. Apparent new-onset diabetes mellitus.  5. Bronchitis.  6. Family history of coronary artery disease, but not early.  7. History of depression/anxiety.  8. Postmenopausal state.  9. Intermittent single palpitations.  10.Elevated blood sugars, may be exacerbated by ongoing steroid      therapy.   RECOMMENDATIONS:  1. We will follow up on 2-D echocardiogram already ordered.  2. We will arrange for outpatient 24-hour monitor which can be carried      out sequentially times multiple days.  3. No indication for beta-blocker at this time as bradycardia.  4. Follow up on thyroid abnormality.  5. I will be happy to see this patient in followup in the hospital for      consideration of additional or other therapy for her tachy      arrhythmia and the results of her ACT monitor.      Francisca December, M.D.  Electronically Signed     JHE/MEDQ  D:  02/04/2009  T:  02/05/2009  Job:  [id number]

## 2010-11-10 NOTE — H&P (Signed)
[name], [name]                ACCOUNT NO.:  [id number]   MEDICAL RECORD NO.:  [id number]          PATIENT TYPE:  INP   LOCATION:  2003                         FACILITY:  [facility]   PHYSICIAN:  Rollene Rotunda, MD, FACCDATE OF BIRTH:  July 14, 1960   DATE OF ADMISSION:  02/22/2009  DATE OF DISCHARGE:                              HISTORY & PHYSICAL   PRIMARY CARE PHYSICIAN:  L. Lupe Carney, M.D.   CARDIOLOGIST:  Dr. Corliss [name].   REASON FOR PRESENTATION:  Evaluate patient with chest pain and multiple  cardiovascular risk factors.   HISTORY OF PRESENT ILLNESS:  The patient is a 50 year old white female  with a recent hospitalization with chest discomfort and palpitations.  She was treated for bronchitis.  She was seen in consultation by Dr.  Amil Amen but no cardiac stress testing was indicated at that time.  There  was no objective evidence of ischemia.  She did have palpitations with  bradycardia noted.  She is currently wearing an event monitor and has  been apparently pressing the button with episodic tachy palpitations.  Today however, she developed chest discomfort.  This has been constant  all day.  It was is unlike previous bronchitis.  She has had a heaviness  in her chest and left arm.  She has taken some Excedrin without  improvement.  She feels like she does have some shortness of breath  though manifesting as some difficulty taking a deep breath.  She has had  some mild dizziness.  She had nausea yesterday associated with a  migraine but none today.  She does not think she has had this kind of  discomfort before.  In the [facility] emergency room, she has had no  objective evidence of ischemia.  Her EKG is nonacute.  Cardiac enzymes  x2 been negative.   The patient has otherwise been active and well at home.  She cares for  young children.  She has not had these chest pain complaints prior to  today.  She has not any PND or orthopnea, though she gets dizzy lying  flat.   This is a new phenomenon.  She has had no new cough, fevers or  chills.  She recently completed a course of steroids for possible  bronchitis.   PAST MEDICAL HISTORY:  1. Tobacco abuse.  2. Bronchitis.  3. Depression.  4. Endometrial ablation and biopsy.   ALLERGIES:  ASPIRIN, BIAXIN, CODEINE, PENICILLIN, SULFA.   MEDICATIONS:  1. Estrogen.  2. Prozac 20 mg daily.  3. Omeprazole 20 mg daily.   SOCIAL HISTORY:  The patient is not married.  She does have 3 living  children.  One child died almost 30 years ago at age 34.  She has been  smoking for 30 years, one pack per day, but is down but is down to 6  cigarettes currently.   FAMILY HISTORY:  Remarkable for father having 4-vessel bypass with  mechanical valve replacement in his 41s.  He died in his 2s.   REVIEW OF SYSTEMS:  As stated in the HPI and positive for fatigue.  Negative  for all other systems.   PHYSICAL EXAMINATION:  The patient is pleasant and in no distress.  Blood pressure 138/85, heart 79 regular, respiratory rate 15.  HEENT:  Eyelids unremarkable.  Pupils equal and reactive, fundi not  visualized.  Oral mucosa unremarkable.  NECK:  No jugular distention of 45 degrees, carotid upstroke brisk and  symmetric, no bruits, no thyromegaly.  LYMPHATICS: No cervical, axillary, inguinal adenopathy.  LUNGS:  Clear to auscultation bilaterally.  BACK:  No costovertebral tenderness.  CHEST:  Unremarkable.  HEART:  PMI not displaced or sustained, S1-S2 within normal limits, no  S3,S4, no clicks, no rubs, no murmurs. ABDOMEN:  Flat, positive bowel  sounds normal frequency and pitch, no bruits, rebound, no guarding or  midline pulsatile mass. No hepatomegaly.  No splenomegaly.  SKIN:  No rashes, no nodules.  EXTREMITIES:  2+ pulses throughout, no edema, no cyanosis or clubbing.  NEURO:  Oriented to person, place and time; cranial nerves II-XII  grossly intact.  Motor grossly intact.   EKG:  Sinus rhythm change, rate 76,  axis within normal limits, intervals  within normal limits, no acute ST-T wave changes.   LABS:  Sodium 40, potassium 3.3, BUN 6, creatinine 0.68, WBC 0.4,  hemoglobin 13, platelets 180.   Chest X-Ray: Chronic bronchitis changes with no acute disease.   ASSESSMENT/PLAN:  1. Chest discomfort: The patient's chest comfort has atypical greater      than typical features.  However, she has significant cardiovascular      risk factors.  She has no objective evidence of ischemia at this      point.  I think it is prudent to keep her in the hospital and rule      her out for myocardial infarction.  I would suggest a stress      perfusion study if enzymes and EKGs are normal in follow-up.  2. Tobacco abuse.  She will be educated again about the need to stop      smoking.  3. Hypokalemia.  I will replace the low potassium.  4. Depression.  This is well treated.  She will continue the Prozac.      Rollene Rotunda, MD, [facility]  Electronically Signed     JH/MEDQ  D:  02/23/2009  T:  02/23/2009  Job:  [id number]

## 2010-11-10 NOTE — H&P (Signed)
[name], KROEZE                ACCOUNT NO.:  [id number]   MEDICAL RECORD NO.:  [id number]          PATIENT TYPE:  OBV   LOCATION:  5531                         FACILITY:  [facility]   PHYSICIAN:  Gordy Savers, MDDATE OF BIRTH:  07-15-1960   DATE OF ADMISSION:  01/31/2009  DATE OF DISCHARGE:                              HISTORY & PHYSICAL   CHIEF COMPLAINT:  Cough, chest pain and shortness of breath.   HISTORY OF PRESENT ILLNESS:  The patient is a 50 year old female with a  history of tobacco abuse but no prior history of COPD or asthma.  She  was stable until approximately 2 weeks ago when she developed  significant cough.  Over this period of time, she has developed chest  congestion but mainly refractory cough that has been nonproductive.  She  initially denied any chest pain or shortness of breath.  Over the past  several days, cough has intensified and has been associated with some  anterior chest wall pain, cough remains nonproductive.  She denies any  fever or chills.  Over the past several days she has developed wheezing  associated with some shortness of breath.  She was initially evaluated  in the emergency department yesterday and treated with parenteral  steroids and discharged on albuterol metered-dose inhaler.  She was  initially treated 2 weeks ago as an outpatient with azithromycin.  She  was seen in follow up by her PCP 2 days ago and was retreated with  azithromycin.  The patient was discharged from the hospital  approximately at 6:00 a.m. earlier today in stable condition.  Throughout the day she developed incessant cough and worsening wheezing.  She presented to the emergency department approximately at 5:30 today  and has been treated aggressively with IV fluid hydration, inhalational  bronchodilators and parenteral steroids.  She continues to have severe  refractory cough and persistent wheezing.  She is now admitted for  further treatment of her  refractory asthmatic bronchitis.   PAST MEDICAL HISTORY:  Fairly unremarkable.  She is a gravida 5, para 4,  abortus 1 with a history of ectopic pregnancy.  She is status post LEEP  in 2003 and has had endometrial ablation procedure in June 2008.  She  has a history of anxiety, depression and menopausal syndrome.   PRESENT MEDICAL REGIMEN:  Includes Xanax, Prozac and Prempro.   ALLERGIES:  Include the following ASPIRIN, BIAXIN, CODEINE, PENICILLIN,  SULFA.   FAMILY HISTORY:  Father died at age 50 of complications of COPD.  He  also is status post surgery for valvular heart disease.  Mother aged 53  has dementia and type 2 diabetes.  Two brothers, one status post  thyroidectomy.  Three sisters positive for osteoarthritis, hypertension  and breast cancer as well as depression.   SOCIAL HISTORY:  Married, 4 children.  Current smoker.  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   REVIEW OF SYSTEMS:  Exam is otherwise negative except as mentioned in  the history of present illness.  She denies any fever, chills or sputum  production, has had worsening chest  congestion, wheezing and cough.   PHYSICAL EXAMINATION:  VITAL SIGNS:  Temperature 98.2, blood pressure  120/74, pulse 110, respiratory rate 24, O2 saturation 98% on room air.  GENERAL:  A well-developed, healthy-appearing female who appeared to be  in no acute distress at rest, but did have frequent paroxysms of severe  coughing.  SKIN:  Warm and dry without rash.  HEAD AND NECK:  Normal pupil responses.  Conjunctiva clear.  Tympanic  membranes were normal.  Oropharynx was clear, perhaps slightly injected.  NECK:  No adenopathy or neck vein distention.  CHEST:  Scattered coarse rhonchi as well as expiratory wheezing.  No  resting tachypnea.  CARDIOVASCULAR:  Resting tachycardia at the rate of 110.  There is a  grade 1-2/6 systolic murmur.  ABDOMEN:  Soft, nontender.  No organomegaly.  EXTREMITIES:  No edema.  No clubbing.  Pedal  pulses were full.  No  peripheral cyanosis.   IMPRESSION:  Refractory asthmatic bronchitis, probably precipitated by  viral tracheobronchitis in the setting of ongoing tobacco use.   DISPOSITION:  We will admit the patient to the hospital.  We will  continue IV fluid hydration and parenteral steroids.  We will complete  two additional doses of azithromycin.  We will continue aggressive  inhalational bronchodilators and symptomatic treatment with antitussives  and expectorants.      Gordy Savers, MD  Electronically Signed     PFK/MEDQ  D:  01/31/2009  T:  02/01/2009  Job:  [id number]

## 2010-12-23 ENCOUNTER — Other Ambulatory Visit: Payer: Self-pay | Admitting: Dermatology

## 2011-02-17 ENCOUNTER — Other Ambulatory Visit: Payer: Self-pay | Admitting: Obstetrics and Gynecology

## 2011-02-17 ENCOUNTER — Other Ambulatory Visit (HOSPITAL_COMMUNITY)
Admission: RE | Admit: 2011-02-17 | Discharge: 2011-02-17 | Disposition: A | Discharge status: Home / Self Care | Payer: Medicaid Other | Source: Ambulatory Visit | Attending: Obstetrics and Gynecology | Admitting: Obstetrics and Gynecology

## 2011-02-17 DIAGNOSIS — Z01419 Encounter for gynecological examination (general) (routine) without abnormal findings: Secondary | ICD-10-CM | POA: Insufficient documentation

## 2011-07-28 ENCOUNTER — Other Ambulatory Visit: Payer: Self-pay | Admitting: Family Medicine

## 2011-07-28 DIAGNOSIS — Z1231 Encounter for screening mammogram for malignant neoplasm of breast: Secondary | ICD-10-CM

## 2011-08-02 ENCOUNTER — Ambulatory Visit: Payer: Medicaid Other

## 2011-08-09 ENCOUNTER — Ambulatory Visit
Admission: RE | Admit: 2011-08-09 | Discharge: 2011-08-09 | Disposition: A | Discharge status: Home / Self Care | Payer: Medicaid Other | Source: Ambulatory Visit | Attending: Family Medicine | Admitting: Family Medicine

## 2011-08-09 DIAGNOSIS — Z1231 Encounter for screening mammogram for malignant neoplasm of breast: Secondary | ICD-10-CM

## 2011-08-25 ENCOUNTER — Other Ambulatory Visit: Payer: Self-pay | Admitting: Family Medicine

## 2011-08-30 ENCOUNTER — Ambulatory Visit
Admission: RE | Admit: 2011-08-30 | Discharge: 2011-08-30 | Disposition: A | Discharge status: Home / Self Care | Payer: Medicaid Other | Source: Ambulatory Visit | Attending: Family Medicine | Admitting: Family Medicine

## 2011-09-05 ENCOUNTER — Encounter (HOSPITAL_COMMUNITY): Payer: Self-pay | Admitting: Emergency Medicine

## 2011-09-05 ENCOUNTER — Emergency Department (HOSPITAL_COMMUNITY): Payer: Medicaid Other

## 2011-09-05 ENCOUNTER — Emergency Department (HOSPITAL_COMMUNITY)
Admission: EM | Admit: 2011-09-05 | Discharge: 2011-09-05 | Disposition: A | Discharge status: Home / Self Care | Payer: Medicaid Other | Attending: Emergency Medicine | Admitting: Emergency Medicine

## 2011-09-05 ENCOUNTER — Other Ambulatory Visit: Payer: Self-pay

## 2011-09-05 DIAGNOSIS — R1013 Epigastric pain: Secondary | ICD-10-CM | POA: Insufficient documentation

## 2011-09-05 DIAGNOSIS — R079 Chest pain, unspecified: Secondary | ICD-10-CM | POA: Insufficient documentation

## 2011-09-05 DIAGNOSIS — R0602 Shortness of breath: Secondary | ICD-10-CM

## 2011-09-05 DIAGNOSIS — K117 Disturbances of salivary secretion: Secondary | ICD-10-CM | POA: Insufficient documentation

## 2011-09-05 DIAGNOSIS — R110 Nausea: Secondary | ICD-10-CM | POA: Insufficient documentation

## 2011-09-05 DIAGNOSIS — I10 Essential (primary) hypertension: Secondary | ICD-10-CM | POA: Insufficient documentation

## 2011-09-05 DIAGNOSIS — R61 Generalized hyperhidrosis: Secondary | ICD-10-CM | POA: Insufficient documentation

## 2011-09-05 DIAGNOSIS — M79609 Pain in unspecified limb: Secondary | ICD-10-CM | POA: Insufficient documentation

## 2011-09-05 DIAGNOSIS — R7402 Elevation of levels of lactic acid dehydrogenase (LDH): Secondary | ICD-10-CM | POA: Insufficient documentation

## 2011-09-05 DIAGNOSIS — R7401 Elevation of levels of liver transaminase levels: Secondary | ICD-10-CM | POA: Insufficient documentation

## 2011-09-05 DIAGNOSIS — Z79899 Other long term (current) drug therapy: Secondary | ICD-10-CM | POA: Insufficient documentation

## 2011-09-05 DIAGNOSIS — R002 Palpitations: Secondary | ICD-10-CM | POA: Insufficient documentation

## 2011-09-05 HISTORY — DX: Anxiety disorder, unspecified: F41.9

## 2011-09-05 HISTORY — DX: Essential (primary) hypertension: I10

## 2011-09-05 HISTORY — DX: Calculus of gallbladder without cholecystitis without obstruction: K80.20

## 2011-09-05 LAB — COMPREHENSIVE METABOLIC PANEL
ALT: 65 U/L — ABNORMAL HIGH (ref 0–35)
AST: 69 U/L — ABNORMAL HIGH (ref 0–37)
Albumin: 3.6 g/dL (ref 3.5–5.2)
BUN: 28 mg/dL — ABNORMAL HIGH (ref 6–23)
Calcium: 9.6 mg/dL (ref 8.4–10.5)
Chloride: 96 mEq/L (ref 96–112)
Sodium: 134 mEq/L — ABNORMAL LOW (ref 135–145)
Total Bilirubin: 0.2 mg/dL — ABNORMAL LOW (ref 0.3–1.2)
Total Protein: 7.5 g/dL (ref 6.0–8.3)

## 2011-09-05 LAB — TROPONIN I: Troponin I: <0.3 ng/mL (ref <0.30)

## 2011-09-05 LAB — CBC
Hemoglobin: 15.4 g/dL — ABNORMAL HIGH (ref 12.0–15.0)
MCHC: 32.7 g/dL (ref 30.0–36.0)
MCV: 76 fL — ABNORMAL LOW (ref 78.0–100.0)
RBC: 6.2 MIL/uL — ABNORMAL HIGH (ref 3.87–5.11)
RDW: 18.2 % — ABNORMAL HIGH (ref 11.5–15.5)
WBC: 11.6 10*3/uL — ABNORMAL HIGH (ref 4.0–10.5)

## 2011-09-05 LAB — DIFFERENTIAL
Basophils Absolute: 0 10*3/uL (ref 0.0–0.1)
Basophils Relative: 0 % (ref 0–1)
Lymphocytes Relative: 28 % (ref 12–46)
Lymphs Abs: 3.3 10*3/uL (ref 0.7–4.0)
Monocytes Absolute: 0.8 10*3/uL (ref 0.1–1.0)
Monocytes Relative: 7 % (ref 3–12)
Neutro Abs: 7.2 10*3/uL (ref 1.7–7.7)
Neutrophils Relative %: 62 % (ref 43–77)

## 2011-09-05 MED ORDER — SODIUM CHLORIDE 0.9 % IV BOLUS (SEPSIS)
1000.0000 mL | Freq: Once | INTRAVENOUS | Status: AC
  Administered 2011-09-05: 1000 mL via INTRAVENOUS

## 2011-09-05 MED ORDER — IOHEXOL 350 MG/ML SOLN
100.0000 mL | Freq: Once | INTRAVENOUS | Status: AC | PRN
  Administered 2011-09-05: 100 mL via INTRAVENOUS

## 2011-09-05 NOTE — ED Notes (Signed)
C/o dry mouth, feeling clammy, heart racing, nausea, and diaphoretic since Friday. Pt relates symptoms to new blood pressure medication she started on 3/4.  Pt also states symptoms started after having [place] of gallbladder on Friday.  PCP told her to stop taking BP medication on Friday.

## 2011-09-05 NOTE — ED Notes (Signed)
I gave the patient a cup of ice, a coke, two packs of graham crackers, and a tub of peanut butter.

## 2011-09-05 NOTE — ED Notes (Signed)
Patient states she started taking chlorthalidone 50 mg once a day on March 4th and on Friday morning approx 90 minutes after taking this medication her started to fell weak, diaphoretic, and felt like she was dying and her heart was racing. Patient did not take this medication again and states she feels fine today except when she stands and walks around.  Patient states when she walks around she fells weak, diaphoretic, nauseated. Patient denies chest pain and states she is SOB and points to epigastric area of chest when describing the chest pain. Family at bedside. EDP at bedside.

## 2011-09-05 NOTE — Discharge Instructions (Signed)
Chest Pain (Nonspecific) It is often hard to give a specific diagnosis for the cause of chest pain. There is always a chance that your pain could be related to something serious, such as a heart attack or a blood clot in the lungs. You need to follow up with your caregiver for further evaluation. CAUSES   Heartburn.   Pneumonia or bronchitis.   Anxiety or stress.   Inflammation around your heart (pericarditis) or lung (pleuritis or pleurisy).   A blood clot in the lung.   A collapsed lung (pneumothorax). It can develop suddenly on its own (spontaneous pneumothorax) or from injury (trauma) to the chest.   Shingles infection (herpes zoster virus).  The chest wall is composed of bones, muscles, and cartilage. Any of these can be the source of the pain.  The bones can be bruised by injury.   The muscles or cartilage can be strained by coughing or overwork.   The cartilage can be affected by inflammation and become sore (costochondritis).  DIAGNOSIS  Lab tests or other studies, such as X-rays, electrocardiography, stress testing, or cardiac imaging, may be needed to find the cause of your pain.  TREATMENT   Treatment depends on what may be causing your chest pain. Treatment may include:   Acid blockers for heartburn.   Anti-inflammatory medicine.   Pain medicine for inflammatory conditions.   Antibiotics if an infection is present.   You may be advised to change lifestyle habits. This includes stopping smoking and avoiding alcohol, caffeine, and chocolate.   You may be advised to keep your head raised (elevated) when sleeping. This reduces the chance of acid going backward from your stomach into your esophagus.   Most of the time, nonspecific chest pain will improve within 2 to 3 days with rest and mild pain medicine.  HOME CARE INSTRUCTIONS   If antibiotics were prescribed, take your antibiotics as directed. Finish them even if you start to feel better.   For the next few  days, avoid physical activities that bring on chest pain. Continue physical activities as directed.   Do not smoke.   Avoid drinking alcohol.   Only take over-the-counter or prescription medicine for pain, discomfort, or fever as directed by your caregiver.   Follow your caregiver's suggestions for further testing if your chest pain does not go away.   Keep any follow-up appointments you made. If you do not go to an appointment, you could develop lasting (chronic) problems with pain. If there is any problem keeping an appointment, you must call to reschedule.  SEEK MEDICAL CARE IF:   You think you are having problems from the medicine you are taking. Read your medicine instructions carefully.   Your chest pain does not go away, even after treatment.   You develop a rash with blisters on your chest.  SEEK IMMEDIATE MEDICAL CARE IF:   You have increased chest pain or pain that spreads to your arm, neck, jaw, back, or abdomen.   You develop shortness of breath, an increasing cough, or you are coughing up blood.   You have severe back or abdominal pain, feel nauseous, or vomit.   You develop severe weakness, fainting, or chills.   You have a fever.  THIS IS AN EMERGENCY. Do not wait to see if the pain will go away. Get medical help at once. Call your local emergency services (911 in [place] Do not drive yourself to the hospital. MAKE SURE YOU:   Understand these instructions.     Will watch your condition.   Will get help right away if you are not doing well or get worse.  Document Released: 03/24/2005 Document Revised: 06/03/2011 Document Reviewed: 01/18/2008 Exit[employer] Patient Information 2012 [employer]  RESOURCE GUIDE  Dental Problems  Patients with Medicaid: [facility]                     [facility] [address]                                           [address] Phone:  [phone]                                                    Phone:  [phone]  If unable to pay or uninsured, contact:  [facility] or [facility] to become qualified for the adult dental clinic.  Chronic Pain Problems Contact [facility]  [phone] Patients need to be referred by their primary care doctor.  Insufficient Money for Medicine Contact [employer]:  call "211" or [facility] 271-5999.  No Primary Care Doctor Call [facility]  832-8000 Other agencies that provide inexpensive medical care    [facility]  832-8035    [facility]  [phone]    [facility]  [phone]    [facility]  [phone]    [facility]  [phone]    [facility]  [phone]  [facility] Services [facility]  [phone] [facility]  [phone] [facility]   800 853-5163 (emergency services 641-4993)  Abuse/Neglect [place] Child Abuse Hotline (336) 641-3795 [place] [facility] Abuse Hotline 800-378-5315 (After Hours)  Emergency Shelter [facility] (336) 271-5985  [facility] (336) 275-9566 [facility] (704) 372-4663  MRSA Hotline #:   832-7006    [place] [facility]  [facility] [place]  [employer]                           [facility] [address] [place]                     [address]         [address]  [place]                                               [place]                              [place] Phone:  [phone]                                  Phone:  [phone]                   Phone:    [phone]  [facility] Phone:  [phone]  [place] Child Abuse Hotline (336) 342-1394 (336) 342-3537 (After Hours)  

## 2011-09-05 NOTE — ED Notes (Signed)
I gave the patients visitor two packs of graham crackers and a tub of peanut butter.

## 2011-09-05 NOTE — ED Provider Notes (Signed)
History     CSN: [id number]  Arrival date & time 09/05/11  1640   First MD Initiated Contact with Patient 09/05/11 1800      Chief Complaint  Patient presents with  . Medication Reaction    (Consider location/radiation/quality/duration/timing/severity/associated sxs/prior treatment) HPI  50yoF h/o anxiety, HTN presents with multiple complaints. She states that she's been having intermittent episodes of diaphoresis, palpitations, nausea and dry mouth  A. The patient states that the symptoms began after taking a new medication which was prescribed for her blood pressure by her primary care Dr. she discontinued the medication 2 days ago but she continues to have these symptoms. Cramping in b/l thighs. She states that the symptoms occur while exerting herself and walking long distances. They're better when she rests. He states that these do not feel like episodes of anxiety. She complains of left chest discomfort which has been constant for 3 days. The chest discomfort as well as epigastric and diffuse abdominal pain began after she an ultrasound to evaluate her gallbladder. The ultrasound revealed cholelithiasis and she was referred to a surgeon for outpatient evaluation for elective cholecystectomy. Denies h/o VTE in self or family. No recent hosp/surg/immob. No h/o cancer. +exogenous hormone use, no leg pain or swelling. She does report increased recent stressors.    ED Notes, ED Provider Notes from 09/05/11 0000 to 09/05/11 17:06:42       Thomasene Ripple, RN 09/05/2011 16:54      C/o dry mouth, feeling clammy, heart racing, nausea, and diaphoretic since Friday. Pt relates symptoms to new blood pressure medication she started on 3/4. Pt also states symptoms started after having [place] of gallbladder on Friday. PCP told her to stop taking BP medication on Friday.    Past Medical History  Diagnosis Date  . Hypertension   . Anxiety   . Gallstones     History reviewed. No pertinent past  surgical history.  No family history on file.  History  Substance Use Topics  . Smoking status: Current Everyday Smoker  . Smokeless tobacco: Not on file  . Alcohol Use: No    OB History    Grav Para Term Preterm Abortions TAB SAB Ect Mult Living                  Review of Systems  All other systems reviewed and are negative.   except as noted HPI   Allergies  Biaxin; Sulfa antibiotics; Aspirin; Augmentin; Dilaudid; Lisinopril; and Latex  Home Medications   Current Outpatient Rx  Name Route Sig Dispense Refill  . ALBUTEROL SULFATE HFA 108 (90 BASE) MCG/ACT IN AERS Inhalation Inhale 2 puffs into the lungs 2 (two) times daily.    Marland Kitchen ALPRAZOLAM 0.5 MG PO TABS Oral Take 0.5 mg by mouth at bedtime as needed. Using more due to itching    . DOXYCYCLINE HYCLATE 100 MG PO TABS Oral Take 100 mg by mouth 2 (two) times daily.    Marland Kitchen CONJ ESTROG-MEDROXYPROGEST ACE 0.3-1.5 MG PO TABS Oral Take 1 tablet by mouth daily.    Marland Kitchen FLUOXETINE HCL 40 MG PO CAPS Oral Take 40 mg by mouth daily.    Marland Kitchen HYDROCODONE-ACETAMINOPHEN 5-500 MG PO TABS Oral Take 1 tablet by mouth 2 (two) times daily as needed. For pain up to twice daily    . HYDROXYZINE HCL 25 MG PO TABS Oral Take 25 mg by mouth 2 (two) times daily as needed. For itching    . IBUPROFEN 800 MG  PO TABS Oral Take 800 mg by mouth every 8 (eight) hours as needed. For pain    . IMIPRAMINE HCL 25 MG PO TABS Oral Take 50 mg by mouth at bedtime.    Marland Kitchen LOPERAMIDE HCL 2 MG PO TABS Oral Take 2-4 mg by mouth 4 (four) times daily as needed. diarrhea    . LORATADINE 10 MG PO TABS Oral Take 10 mg by mouth daily.    Marland Kitchen OMEPRAZOLE 20 MG PO CPDR Oral Take 20 mg by mouth daily.    Marland Kitchen VALSARTAN 160 MG PO TABS Oral Take 160 mg by mouth daily.      BP 119/82  Pulse 99  Temp(Src) 97.1 F (36.2 C) (Oral)  Resp 16  SpO2 100%  LMP 07/15/2011  Physical Exam  Nursing note and vitals reviewed. Constitutional: She is oriented to person, place, and time. She appears  well-developed.  HENT:  Head: Atraumatic.  Mouth/Throat: Oropharynx is clear and moist.  Eyes: Conjunctivae and EOM are normal. Pupils are equal, round, and reactive to light.  Neck: Normal range of motion. Neck supple.  Cardiovascular: Normal rate, regular rhythm, normal heart sounds and intact distal pulses.   Pulmonary/Chest: Effort normal and breath sounds normal. No respiratory distress. She has no wheezes. She has no rales.  Abdominal: Soft. She exhibits no distension. There is tenderness. There is no rebound and no guarding.       Diffuse upper abdominal pain  Musculoskeletal: Normal range of motion. She exhibits no edema and no tenderness.  Neurological: She is alert and oriented to person, place, and time.  Skin: Skin is warm and dry. No rash noted.  Psychiatric: She has a normal mood and affect.    Date: 09/05/2011  Rate: 106  Rhythm: sinus tachycardia  QRS Axis: normal  Intervals: normal  ST/T Wave abnormalities: nonspecific T wave changes  Conduction Disutrbances:none  Narrative Interpretation:   Old EKG Reviewed: no significant change   ED Course  Procedures (including critical care time)  Labs Reviewed  CBC - Abnormal; Notable for the following:    WBC 11.6 (*)    RBC 6.20 (*)    Hemoglobin 15.4 (*)    HCT 47.1 (*)    MCV 76.0 (*)    MCH 24.8 (*)    RDW 18.2 (*)    All other components within normal limits  D-DIMER, QUANTITATIVE - Abnormal; Notable for the following:    D-Dimer, Quant 1.86 (*)    All other components within normal limits  COMPREHENSIVE METABOLIC PANEL - Abnormal; Notable for the following:    Sodium 134 (*)    Glucose, Bld 103 (*)    BUN 28 (*)    AST 69 (*)    ALT 65 (*)    Alkaline Phosphatase 141 (*)    Total Bilirubin 0.2 (*)    All other components within normal limits  LIPASE, BLOOD - Abnormal; Notable for the following:    Lipase 68 (*)    All other components within normal limits  DIFFERENTIAL  TROPONIN I  LAB REPORT -  SCANNED   Dg Chest 2 View  09/05/2011  *RADIOLOGY REPORT*  Clinical Data: Chronic cough.  CHEST - 2 VIEW  Comparison: 03/24/2010.  Findings: The cardiac silhouette, mediastinal and hilar contours are within normal limits and stable.  There are chronic-appearing bronchitic type lung changes, likely related to smoking.  Acute bronchitis is also possible.  No focal infiltrates or effusions.  IMPRESSION: Bronchitic type lung changes likely related  to smoking or acute bronchitis.  Original Report Authenticated By: P. Loralie Champagne, M.D.   Ct Angio Chest W/cm &/or Wo Cm  09/05/2011  *RADIOLOGY REPORT*  Clinical Data: Chest pain, shortness of breath  CT ANGIOGRAPHY CHEST  Technique:  Multidetector CT imaging of the chest using the standard protocol during bolus administration of intravenous contrast. Multiplanar reconstructed images including MIPs were obtained and reviewed to evaluate the vascular anatomy.  Contrast: OMNIPAQUE IOHEXOL 350 MG/ML IV SOLN  Comparison: Radiograph from 09/05/2011, 09/04/2006 CT  Findings: No pulmonary arterial branch filling defect identified. Mild scattered atherosclerosis of the aorta and branch vessels.  No aneurysmal dilatation or dissection flap identified.  Normal heart size.  No pleural or pericardial effusion.  No intrathoracic lymphadenopathy.  Limited images through the upper abdomen demonstrate fatty liver and gallstones.  The central airways are patent with exception of a small amount of mucus/debris layering within the right main bronchus.  Mild emphysematous change.  Dependent atelectasis.  No pneumothorax.  No acute osseous abnormality.  IMPRESSION: No pulmonary embolism identified.  Mild emphysematous changes.  Hepatic steatosis.  Cholelithiasis.  Original Report Authenticated By: Waneta Martins, M.D.     1. Transaminitis   2. Shortness of breath   3. Chest pain   4. Nausea      MDM  Multiple complaints. No emergent cause identified today in the ED.  She is asx. States she has known transaminitis for which she had U/S 2 days ago and has been having abdominal wall soreness since the ultrasound. Feels well and comfortable with discharge home.  BP 119/82  Pulse 99  Temp(Src) 97.1 F (36.2 C) (Oral)  Resp 16  SpO2 100%  LMP 07/15/2011         Forbes Cellar, MD 09/06/11 1531

## 2011-09-17 ENCOUNTER — Encounter (INDEPENDENT_AMBULATORY_CARE_PROVIDER_SITE_OTHER): Payer: Self-pay

## 2011-09-20 ENCOUNTER — Encounter (INDEPENDENT_AMBULATORY_CARE_PROVIDER_SITE_OTHER): Payer: Self-pay | Admitting: Surgery

## 2011-09-20 ENCOUNTER — Ambulatory Visit (INDEPENDENT_AMBULATORY_CARE_PROVIDER_SITE_OTHER): Payer: Medicaid Other | Admitting: Surgery

## 2011-09-20 VITALS — BP 152/96 | HR 86 | Temp 98.3°F | Ht 68.0 in | Wt 186.2 lb

## 2011-09-20 DIAGNOSIS — K8000 Calculus of gallbladder with acute cholecystitis without obstruction: Secondary | ICD-10-CM

## 2011-09-20 NOTE — Progress Notes (Signed)
Patient ID: [name], female   DOB: 01-30-1961, 51 y.o.   MRN: [id number]  No chief complaint on file.   HPI [name] is a 51 y.o. female.   HPI Patient presents at the request of Dr. Clovis Riley due to epigastric pain. She has a history of epigastric pain. He comes and goes. Her last about 3 minutes and is sharp in nature and radiates to her mid back. She was seen in the emergency room 2 weeks ago for this. She had a workup for pulmonary embolus which was negative. CT showed gallstones. She underwent ultrasound which showed gallstones. Her liver function studies were mildly elevated her present to the emergency room and her lipase was mildly elevated. Hepatitis profile negative. The pain is associated with nausea without vomiting. No foods seem to elicit the attacks.  Past Medical History  Diagnosis Date  . Hypertension   . Anxiety   . Gallstones   . GERD (gastroesophageal reflux disease)   . COPD (chronic obstructive pulmonary disease)     No past surgical history on file.  Family History  Problem Relation Age of Onset  . Breast cancer Sister   . Lung cancer Father     Social History History  Substance Use Topics  . Smoking status: Current Everyday Smoker  . Smokeless tobacco: Not on file  . Alcohol Use: No    Allergies  Allergen Reactions  . Biaxin Shortness Of Breath and Swelling  . Sulfa Antibiotics Shortness Of Breath and Hypertension  . Aspirin Nausea And Vomiting  . Augmentin Diarrhea  . Dilaudid (Hydromorphone Hcl) Nausea And Vomiting  . Lisinopril Cough  . Latex Hives and Rash    Current Outpatient Prescriptions  Medication Sig Dispense Refill  . albuterol (PROVENTIL HFA;VENTOLIN HFA) 108 (90 BASE) MCG/ACT inhaler Inhale 2 puffs into the lungs 2 (two) times daily.      Marland Kitchen ALPRAZolam (XANAX) 0.5 MG tablet Take 0.5 mg by mouth at bedtime as needed. Using more due to itching      . doxycycline (VIBRA-TABS) 100 MG tablet Take 100 mg by mouth 2 (two) times  daily.      Marland Kitchen FLUoxetine (PROZAC) 40 MG capsule Take 40 mg by mouth daily.      Marland Kitchen HYDROcodone-acetaminophen (VICODIN) 5-500 MG per tablet Take 1 tablet by mouth 2 (two) times daily as needed. For pain up to twice daily      . hydrOXYzine (ATARAX/VISTARIL) 25 MG tablet Take 25 mg by mouth 2 (two) times daily as needed. For itching      . ibuprofen (ADVIL,MOTRIN) 800 MG tablet Take 800 mg by mouth every 8 (eight) hours as needed. For pain      . imipramine (TOFRANIL) 25 MG tablet Take 50 mg by mouth at bedtime.      Marland Kitchen loperamide (IMODIUM A-D) 2 MG tablet Take 2-4 mg by mouth 4 (four) times daily as needed. diarrhea      . loratadine (CLARITIN) 10 MG tablet Take 10 mg by mouth daily.      Marland Kitchen omeprazole (PRILOSEC) 20 MG capsule Take 20 mg by mouth daily.      . valsartan (DIOVAN) 160 MG tablet Take 160 mg by mouth daily.        Review of Systems Review of Systems  Constitutional: Negative.   HENT: Negative.   Eyes: Negative.   Respiratory: Negative.   Cardiovascular: Negative.   Gastrointestinal: Positive for nausea and abdominal pain. Negative for abdominal distention.  Genitourinary:  Negative.   Musculoskeletal: Negative.   Skin: Negative.   Neurological: Negative.   Hematological: Negative.   Psychiatric/Behavioral: Negative.     Blood pressure 152/96, pulse 86, temperature 98.3 F (36.8 C), height 5\' 8"  (1.727 m), weight 186 lb 3.2 oz (84.46 kg), last menstrual period 07/15/2011.  Physical Exam Physical Exam  Constitutional: She is oriented to person, place, and time. She appears well-developed and well-nourished.  HENT:  Head: Normocephalic and atraumatic.  Eyes: EOM are normal. Pupils are equal, round, and reactive to light.  Neck: Normal range of motion. Neck supple.  Cardiovascular: Normal rate and regular rhythm.   Pulmonary/Chest: Effort normal and breath sounds normal.  Abdominal: Soft. Bowel sounds are normal. She exhibits no distension. There is no tenderness. There is  no rebound.  Musculoskeletal: Normal range of motion.  Neurological: She is alert and oriented to person, place, and time.  Skin: Skin is warm and dry.  Psychiatric: She has a normal mood and affect. Her behavior is normal. Judgment and thought content normal.    Data Reviewed U/S   Gallstones no CBD dilation.  Questionable kidney stone on right. CT chest no PE gallstones.  LFT slightly elevate.  Lipase slightly elevated.  Assessment    Symptomatic cholelithiasis  Mild elevation LFTs  Mild elevation of lipase  Hypertension  Tobacco abuse    Plan    The procedure has been discussed with the patient. Operative and non operative treatments have been discussed. Risks of surgery include bleeding, infection,  Common bile duct injury,  Injury to the stomach,liver, colon,small intestine, abdominal wall,  Diaphragm,  Major blood vessels,  And the need for an open procedure.  Other risks include worsening of medical problems, death,  DVT and pulmonary embolism, and cardiovascular events.   Medical options have also been discussed. The patient has been informed of long term expectations of surgery and non surgical options,  The patient agrees to proceed.         Kyion Gautier A. 09/20/2011, 10:12 AM

## 2011-09-20 NOTE — Patient Instructions (Signed)
Laparoscopic Cholecystectomy Laparoscopic cholecystectomy is surgery to remove the gallbladder. The gallbladder is located slightly to the right of center in the abdomen, behind the liver. It is a concentrating and storage sac for the bile produced in the liver. Bile aids in the digestion and absorption of fats. Gallbladder disease (cholecystitis) is an inflammation of your gallbladder. This condition is usually caused by a buildup of gallstones (cholelithiasis) in your gallbladder. Gallstones can block the flow of bile, resulting in inflammation and pain. In severe cases, emergency surgery may be required. When emergency surgery is not required, you will have time to prepare for the procedure. Laparoscopic surgery is an alternative to open surgery. Laparoscopic surgery usually has a shorter recovery time. Your common bile duct may also need to be examined and explored. Your caregiver will discuss this with you if he or she feels this should be done. If stones are found in the common bile duct, they may be removed. LET YOUR CAREGIVER KNOW ABOUT:  Allergies to food or medicine.   Medicines taken, including vitamins, herbs, eyedrops, over-the-counter medicines, and creams.   Use of steroids (by mouth or creams).   Previous problems with anesthetics or numbing medicines.   History of bleeding problems or blood clots.   Previous surgery.   Other health problems, including diabetes and kidney problems.   Possibility of pregnancy, if this applies.  RISKS AND COMPLICATIONS All surgery is associated with risks. Some problems that may occur following this procedure include:  Infection.   Damage to the common bile duct, nerves, arteries, veins, or other internal organs such as the stomach or intestines.   Bleeding.   A stone may remain in the common bile duct.  BEFORE THE PROCEDURE  Do not take aspirin for 3 days prior to surgery or blood thinners for 1 week prior to surgery.   Do not eat or  drink anything after midnight the night before surgery.   Let your caregiver know if you develop a cold or other infectious problem prior to surgery.   You should be present 60 minutes before the procedure or as directed.  PROCEDURE  You will be given medicine that makes you sleep (general anesthetic). When you are asleep, your surgeon will make several small cuts (incisions) in your abdomen. One of these incisions is used to insert a small, lighted scope (laparoscope) into the abdomen. The laparoscope helps the surgeon see into your abdomen. Carbon dioxide gas will be pumped into your abdomen. The gas allows more room for the surgeon to perform your surgery. Other operating instruments are inserted through the other incisions. Laparoscopic procedures may not be appropriate when:  There is major scarring from previous surgery.   The gallbladder is extremely inflamed.   There are bleeding disorders or unexpected cirrhosis of the liver.   A pregnancy is near term.   Other conditions make the laparoscopic procedure impossible.  If your surgeon feels it is not safe to continue with a laparoscopic procedure, he or she will perform an open abdominal procedure. In this case, the surgeon will make an incision to open the abdomen. This gives the surgeon a larger view and field to work within. This may allow the surgeon to perform procedures that sometimes cannot be performed with a laparoscope alone. Open surgery has a longer recovery time. AFTER THE PROCEDURE  You will be taken to the recovery area where a nurse will watch and check your progress.   You may be allowed to go home   the same day.   Do not resume physical activities until directed by your caregiver.   You may resume a normal diet and activities as directed.  Document Released: 06/14/2005 Document Revised: 06/03/2011 Document Reviewed: 11/27/2010 [employer] Patient Information 2012 [employer]  Laparoscopic Cholecystectomy Care  After Refer to this sheet in the next few weeks. These instructions provide you with information on caring for yourself after your procedure. Your caregiver may also give you more specific instructions. Your treatment has been planned according to current medical practices, but problems sometimes occur. Call your caregiver if you have any problems or questions after your procedure. HOME CARE INSTRUCTIONS   Change bandages (dressings) as directed by your caregiver.   Keep the wound dry and clean. The wound may be washed gently with soap and water. Gently blot or dab the area dry.   Do not take baths or use swimming pools or hot tubs for 10 days, or as instructed by your caregiver.   Only take over-the-counter or prescription medicines for pain, discomfort, or fever as directed by your caregiver.   Continue your normal diet as directed by your caregiver.   Do not lift anything heavier than 25 pounds (11.5 kg), or as directed by your caregiver.   Do not play contact sports for 1 week, or as directed by your caregiver.  SEEK MEDICAL CARE IF:   There is redness, swelling, or increasing pain in the wound.   You notice yellowish-white fluid (pus) coming from the wound.   There is drainage from the wound that lasts longer than 1 day.   There is a bad smell coming from the wound or dressing.   The surgical cut (incision) breaks open.  SEEK IMMEDIATE MEDICAL CARE IF:   You develop a rash.   You have difficulty breathing.   You develop chest pain.   You develop any reaction or side effects to medicines given.   You have a fever.   You have increasing pain in the shoulders (shoulder strap areas).   You have dizzy episodes or faint while standing.   You develop severe abdominal pain.   You feel sick to your stomach (nauseous) or throw up (vomit) and this lasts for more than 1 day.  MAKE SURE YOU:   Understand these instructions.   Will watch your condition.   Will get help  right away if you are not doing well or get worse.  Document Released: 06/14/2005 Document Revised: 06/03/2011 Document Reviewed: 11/27/2010 [employer] Patient Information 2012 [employer] 

## 2011-10-08 ENCOUNTER — Encounter (HOSPITAL_COMMUNITY): Payer: Self-pay | Admitting: Pharmacy Technician

## 2011-10-13 ENCOUNTER — Encounter (HOSPITAL_COMMUNITY): Payer: Self-pay

## 2011-10-13 ENCOUNTER — Encounter (HOSPITAL_COMMUNITY)
Admission: RE | Admit: 2011-10-13 | Discharge: 2011-10-13 | Disposition: A | Discharge status: Home / Self Care | Payer: Medicaid Other | Source: Ambulatory Visit | Attending: Surgery | Admitting: Surgery

## 2011-10-13 DIAGNOSIS — Z01812 Encounter for preprocedural laboratory examination: Secondary | ICD-10-CM | POA: Insufficient documentation

## 2011-10-13 DIAGNOSIS — Z538 Procedure and treatment not carried out for other reasons: Secondary | ICD-10-CM | POA: Insufficient documentation

## 2011-10-13 HISTORY — DX: Rash and other nonspecific skin eruption: R21

## 2011-10-13 HISTORY — DX: Major depressive disorder, single episode, unspecified: F32.9

## 2011-10-13 HISTORY — DX: Cardiac arrhythmia, unspecified: I49.9

## 2011-10-13 HISTORY — DX: Depression, unspecified: F32.A

## 2011-10-13 HISTORY — DX: Headache: R51

## 2011-10-13 HISTORY — DX: Unspecified osteoarthritis, unspecified site: M19.90

## 2011-10-13 LAB — BASIC METABOLIC PANEL
BUN: 8 mg/dL (ref 6–23)
CO2: 26 mEq/L (ref 19–32)
Calcium: 9.5 mg/dL (ref 8.4–10.5)
Chloride: 101 mEq/L (ref 96–112)
Creatinine, Ser: 0.56 mg/dL (ref 0.50–1.10)
GFR calc Af Amer: >90 mL/min (ref >90)
GFR calc non Af Amer: >90 mL/min (ref >90)
Glucose, Bld: 97 mg/dL (ref 70–99)
Potassium: 4.3 mEq/L (ref 3.5–5.1)
Sodium: 137 mEq/L (ref 135–145)

## 2011-10-13 LAB — CBC
HCT: 42.9 % (ref 36.0–46.0)
Hemoglobin: 14.1 g/dL (ref 12.0–15.0)
WBC: 11.5 10*3/uL — ABNORMAL HIGH (ref 4.0–10.5)

## 2011-10-13 LAB — HCG, SERUM, QUALITATIVE: Preg, Serum: NEGATIVE

## 2011-10-13 LAB — SURGICAL PCR SCREEN: Staphylococcus aureus: NEGATIVE

## 2011-10-13 NOTE — Patient Instructions (Addendum)
20 NAW LASALA  10/13/2011   Your procedure is scheduled on:  10/20/11  Report to SHORT STAY DEPT  at 6:00 AM.  Call this number if you have problems the morning of surgery: [phone]   Remember:   Do not eat food or drink liquids AFTER MIDNIGHT    Take these medicines the morning of surgery with A SIP OF WATER: DOXYCYCLINE / PROZAC / CLARITIN / PRILOSEC / VICODIN IF NEEDED   Do not wear jewelry, make-up or nail polish.  Do not wear lotions, powders, or perfumes.   Do not shave legs or underarms 48 hrs. before surgery (men may shave face)  Do not bring valuables to the hospital.  Contacts, dentures or bridgework may not be worn into surgery.  Leave suitcase in the car. After surgery it may be brought to your room.                For patients admitted to the hospital, checkout time is 11:00 AM the day of discharge.   Patients discharged the day of surgery will not be allowed to drive home.  Name and phone number of your driver:   Special Instructions:   Please read over the following fact sheets that you were given: MRSA  Information               SHOWER WITH BETASEPT THE NIGHT BEFORE SURGERY AND THE MORNING OF SURGERY

## 2011-11-05 ENCOUNTER — Encounter (INDEPENDENT_AMBULATORY_CARE_PROVIDER_SITE_OTHER): Payer: Medicaid Other | Admitting: Surgery

## 2011-11-10 ENCOUNTER — Emergency Department (HOSPITAL_COMMUNITY): Payer: Medicaid Other

## 2011-11-10 ENCOUNTER — Encounter (HOSPITAL_COMMUNITY): Payer: Self-pay | Admitting: Emergency Medicine

## 2011-11-10 ENCOUNTER — Emergency Department (HOSPITAL_COMMUNITY)
Admission: EM | Admit: 2011-11-10 | Discharge: 2011-11-10 | Disposition: A | Discharge status: Home / Self Care | Payer: Medicaid Other | Attending: Emergency Medicine | Admitting: Emergency Medicine

## 2011-11-10 ENCOUNTER — Other Ambulatory Visit: Payer: Self-pay

## 2011-11-10 DIAGNOSIS — R000 Tachycardia, unspecified: Secondary | ICD-10-CM | POA: Insufficient documentation

## 2011-11-10 DIAGNOSIS — F17200 Nicotine dependence, unspecified, uncomplicated: Secondary | ICD-10-CM | POA: Insufficient documentation

## 2011-11-10 DIAGNOSIS — F411 Generalized anxiety disorder: Secondary | ICD-10-CM | POA: Insufficient documentation

## 2011-11-10 DIAGNOSIS — R079 Chest pain, unspecified: Secondary | ICD-10-CM | POA: Insufficient documentation

## 2011-11-10 DIAGNOSIS — F419 Anxiety disorder, unspecified: Secondary | ICD-10-CM

## 2011-11-10 DIAGNOSIS — R0602 Shortness of breath: Secondary | ICD-10-CM | POA: Insufficient documentation

## 2011-11-10 DIAGNOSIS — K7689 Other specified diseases of liver: Secondary | ICD-10-CM | POA: Insufficient documentation

## 2011-11-10 DIAGNOSIS — I10 Essential (primary) hypertension: Secondary | ICD-10-CM | POA: Insufficient documentation

## 2011-11-10 DIAGNOSIS — R10819 Abdominal tenderness, unspecified site: Secondary | ICD-10-CM | POA: Insufficient documentation

## 2011-11-10 LAB — DIFFERENTIAL
Eosinophils Absolute: 0.3 10*3/uL (ref 0.0–0.7)
Lymphocytes Relative: 24 % (ref 12–46)
Lymphs Abs: 3.1 10*3/uL (ref 0.7–4.0)
Monocytes Relative: 4 % (ref 3–12)
Neutro Abs: 8.6 10*3/uL — ABNORMAL HIGH (ref 1.7–7.7)
Neutrophils Relative %: 69 % (ref 43–77)

## 2011-11-10 LAB — COMPREHENSIVE METABOLIC PANEL
AST: 52 U/L — ABNORMAL HIGH (ref 0–37)
Albumin: 3.5 g/dL (ref 3.5–5.2)
Alkaline Phosphatase: 159 U/L — ABNORMAL HIGH (ref 39–117)
BUN: 11 mg/dL (ref 6–23)
Chloride: 100 mEq/L (ref 96–112)
Creatinine, Ser: 0.52 mg/dL (ref 0.50–1.10)
Potassium: 3.9 mEq/L (ref 3.5–5.1)
Total Bilirubin: 0.2 mg/dL — ABNORMAL LOW (ref 0.3–1.2)
Total Protein: 7.2 g/dL (ref 6.0–8.3)

## 2011-11-10 LAB — D-DIMER, QUANTITATIVE: D-Dimer, Quant: 1.8 ug/mL-FEU — ABNORMAL HIGH (ref 0.00–0.48)

## 2011-11-10 LAB — CBC
Hemoglobin: 13.8 g/dL (ref 12.0–15.0)
Platelets: 234 10*3/uL (ref 150–400)
RBC: 5.18 MIL/uL — ABNORMAL HIGH (ref 3.87–5.11)
WBC: 12.6 10*3/uL — ABNORMAL HIGH (ref 4.0–10.5)

## 2011-11-10 LAB — APTT: aPTT: 30 seconds (ref 24–37)

## 2011-11-10 LAB — LIPASE, BLOOD: Lipase: 58 U/L (ref 11–59)

## 2011-11-10 LAB — POCT I-STAT TROPONIN I

## 2011-11-10 LAB — PROTIME-INR
INR: 0.98 (ref 0.00–1.49)
Prothrombin Time: 13.2 seconds (ref 11.6–15.2)

## 2011-11-10 MED ORDER — ONDANSETRON HCL 4 MG/2ML IJ SOLN
INTRAMUSCULAR | Status: AC
  Filled 2011-11-10: qty 2

## 2011-11-10 MED ORDER — ONDANSETRON HCL 4 MG/2ML IJ SOLN
4.0000 mg | Freq: Once | INTRAMUSCULAR | Status: AC
  Administered 2011-11-10: 4 mg via INTRAVENOUS

## 2011-11-10 MED ORDER — LORAZEPAM 1 MG PO TABS: 1.0000 mg | ORAL_TABLET | Freq: Three times a day (TID) | ORAL | Status: AC | PRN

## 2011-11-10 MED ORDER — IOHEXOL 350 MG/ML SOLN
100.0000 mL | Freq: Once | INTRAVENOUS | Status: AC | PRN
  Administered 2011-11-10: 100 mL via INTRAVENOUS

## 2011-11-10 MED ORDER — NITROGLYCERIN 0.4 MG SL SUBL: 0.4000 mg | SUBLINGUAL_TABLET | SUBLINGUAL | Status: DC | PRN

## 2011-11-10 MED ORDER — LORAZEPAM 2 MG/ML IJ SOLN
1.0000 mg | Freq: Once | INTRAMUSCULAR | Status: AC
  Administered 2011-11-10: 1 mg via INTRAVENOUS
  Filled 2011-11-10: qty 1

## 2011-11-10 NOTE — ED Notes (Signed)
Patient transported to CT 

## 2011-11-10 NOTE — ED Provider Notes (Signed)
History     CSN: [id number]  Arrival date & time 11/10/11  0016   First MD Initiated Contact with Patient 11/10/11 [phone]      Chief Complaint  Patient presents with  . Chest Pain    (Consider location/radiation/quality/duration/timing/severity/associated sxs/prior treatment) HPI Comments: 51 year old female with a history of hypertension, tobacco abuse, COPD and reflux disease who was recently evaluated by general surgery and found to have symptomatic cholelithiasis and scheduled for cholecystectomy on the 28th of this month. She states that she has had evaluation in the past by cardiology with a stress test and a heart catheterization in 2010 which were both normal  (I cannot access his records through the electronic medical record) and presents this evening because of 6 hours of ongoing chest pain. The pain was acute in onset, described as pressure, radiates across both sides of the chest and is associated with nausea, mild shortness of breath. The symptoms are persistent, nothing makes them better or worse, not associated with coughing, fevers, back pain, swelling in the legs. She does have mild midabdominal pain which she describes to her history of gallbladder problems. She denies having pain similar to this in her chest in the past.  Patient is a 51 y.o. female presenting with chest pain. The history is provided by the patient and a relative.  Chest Pain     Past Medical History  Diagnosis Date  . Hypertension   . Anxiety   . Gallstones   . GERD (gastroesophageal reflux disease)   . COPD (chronic obstructive pulmonary disease)   . Dysrhythmia     pt unsure of name of arrythmia - " my heart rate will drop all of a sudden" -no current treatment   . Headache     migraines  . Rash     arms  . Arthritis   . Depression     Past Surgical History  Procedure Date  . Ectopic pregnancy surgery many yrs ago  . Carpal tunnel release     bil    Family History  Problem Relation  Age of Onset  . Breast cancer Sister   . Lung cancer Father     History  Substance Use Topics  . Smoking status: Current Everyday Smoker -- 0.5 packs/day  . Smokeless tobacco: Not on file  . Alcohol Use: No    OB History    Grav Para Term Preterm Abortions TAB SAB Ect Mult Living                  Review of Systems  Cardiovascular: Positive for chest pain.  All other systems reviewed and are negative.    Allergies  Clarithromycin; Sulfa antibiotics; Amoxicillin-pot clavulanate; Aspirin; Chlorthalidone; Dilaudid; Lasix; Lisinopril; and Latex  Home Medications   Current Outpatient Rx  Name Route Sig Dispense Refill  . ALBUTEROL SULFATE HFA 108 (90 BASE) MCG/ACT IN AERS Inhalation Inhale 2 puffs into the lungs every 4 (four) hours as needed. Wheezing     . ALPRAZOLAM 0.5 MG PO TABS Oral Take 0.5 mg by mouth at bedtime as needed. Anxiety     . DOXYCYCLINE HYCLATE 100 MG PO TABS Oral Take 100 mg by mouth 2 (two) times daily.    Marland Kitchen CONJ ESTROG-MEDROXYPROGEST ACE 0.45-1.5 MG PO TABS Oral Take 1 tablet by mouth daily.    Marland Kitchen FLUOXETINE HCL 40 MG PO CAPS Oral Take 40 mg by mouth daily with breakfast.     . HYDROCODONE-ACETAMINOPHEN 5-500 MG PO TABS Oral  Take 1 tablet by mouth 2 (two) times daily as needed. Pain     . HYDROXYZINE HCL 25 MG PO TABS Oral Take 25 mg by mouth 2 (two) times daily as needed. For itching    . IBUPROFEN 800 MG PO TABS Oral Take 800 mg by mouth every 8 (eight) hours as needed. For pain    . IMIPRAMINE HCL 25 MG PO TABS Oral Take 50 mg by mouth at bedtime.    Marland Kitchen LOPERAMIDE HCL 2 MG PO TABS Oral Take 2-4 mg by mouth 4 (four) times daily as needed. diarrhea    . LORATADINE 10 MG PO TABS Oral Take 10 mg by mouth daily.    Marland Kitchen OMEPRAZOLE 20 MG PO CPDR Oral Take 20 mg by mouth daily.    Marland Kitchen VALSARTAN 160 MG PO TABS Oral Take 160 mg by mouth daily with breakfast.     . LORAZEPAM 1 MG PO TABS Oral Take 1 tablet (1 mg total) by mouth 3 (three) times daily as needed for  anxiety. 8 tablet 0    BP 134/88  Pulse 110  Temp(Src) 98.5 F (36.9 C) (Oral)  Resp 18  SpO2 96%  LMP 07/24/2011  Physical Exam  Nursing note and vitals reviewed. Constitutional: She appears well-developed and well-nourished. No distress.  HENT:  Head: Normocephalic and atraumatic.  Mouth/Throat: Oropharynx is clear and moist. No oropharyngeal exudate.  Eyes: Conjunctivae and EOM are normal. Pupils are equal, round, and reactive to light. Right eye exhibits no discharge. Left eye exhibits no discharge. No scleral icterus.  Neck: Normal range of motion. Neck supple. No JVD present. No thyromegaly present.  Cardiovascular: Normal rate, regular rhythm, normal heart sounds and intact distal pulses.  Exam reveals no gallop and no friction rub.   No murmur heard. Pulmonary/Chest: Effort normal and breath sounds normal. No respiratory distress. She has no wheezes. She has no rales. She exhibits no tenderness ( Chaperone present for her chest wall exam including breast exam, mild tenderness to palpation over the right chest wall, states is similar to her pain.).  Abdominal: Soft. Bowel sounds are normal. She exhibits no distension and no mass. There is tenderness ( Mild right upper quadrant and epigastric tenderness without guarding or masses. Non-peritoneal).  Musculoskeletal: Normal range of motion. She exhibits no edema and no tenderness.  Lymphadenopathy:    She has no cervical adenopathy.  Neurological: She is alert. Coordination normal.  Skin: Skin is warm and dry. No rash noted. No erythema.  Psychiatric: She has a normal mood and affect. Her behavior is normal.    ED Course  Procedures (including critical care time)  ED ECG REPORT   Date: 11/10/2011   Rate: 108  Rhythm: sinus tachycardia  QRS Axis: normal  Intervals: normal  ST/T Wave abnormalities: normal  Conduction Disutrbances:none  Narrative Interpretation:   Old EKG Reviewed: none available   Labs Reviewed  CBC -  Abnormal; Notable for the following:    WBC 12.6 (*)    RBC 5.18 (*)    RDW 17.9 (*)    All other components within normal limits  DIFFERENTIAL - Abnormal; Notable for the following:    Neutro Abs 8.6 (*)    All other components within normal limits  COMPREHENSIVE METABOLIC PANEL - Abnormal; Notable for the following:    Sodium 133 (*)    Glucose, Bld 171 (*)    AST 52 (*) HEMOLYSIS AT THIS LEVEL MAY AFFECT RESULT   ALT 66 (*)  Alkaline Phosphatase 159 (*)    Total Bilirubin 0.2 (*)    All other components within normal limits  D-DIMER, QUANTITATIVE - Abnormal; Notable for the following:    D-Dimer, Quant 1.80 (*)    All other components within normal limits  APTT  PROTIME-INR  LIPASE, BLOOD  POCT I-STAT TROPONIN I  D-DIMER, QUANTITATIVE   Dg Chest 2 View  11/10/2011  *RADIOLOGY REPORT*  Clinical Data: Right upper quadrant pain.  Lower right chest pain. Smoker.  CHEST - 2 VIEW  Comparison: 09/05/2011  Findings: Normal heart size and pulmonary vascularity. Peribronchial interstitial changes suggesting chronic bronchitis. No focal airspace consolidation in the lungs.  No blunting of costophrenic angles.  No pneumothorax.  No significant change since previous study.  IMPRESSION: No evidence of active pulmonary disease.  Chronic bronchitic changes.  Original Report Authenticated By: Marlon Pel, M.D.   Ct Angio Chest W/cm &/or Wo Cm  11/10/2011  *RADIOLOGY REPORT*  Clinical Data: Right lower chest pain radiating to the abdomen. Tenderness to touch.  Shortness of breath and nausea.  Tachycardia.  CT ANGIOGRAPHY CHEST  Technique:  Multidetector CT imaging of the chest using the standard protocol during bolus administration of intravenous contrast. Multiplanar reconstructed images including MIPs were obtained and reviewed to evaluate the vascular anatomy.  Contrast: OMNIPAQUE IOHEXOL 350 MG/ML SOLN  Comparison: 09/05/2011  Findings: Technically adequate study with moderately good  opacification of the central and segmental pulmonary arteries.  No focal filling defects demonstrated.  No evidence of significant pulmonary embolus.  Normal caliber thoracic aorta with mild calcification.  Normal heart size.  No significant lymphadenopathy in the chest.  Esophagus is decompressed.  No pleural effusions. Scattered emphysematous changes in the lungs.  Dependent atelectasis.  No focal airspace consolidation.  No significant interstitial changes.  No pneumothorax.  Airways appear patent.  Images obtained incidentally of the upper abdomen demonstrate diffuse fatty infiltration of the liver.  No significant changes since the previous study.  IMPRESSION: No evidence of significant pulmonary embolus.  Scattered emphysematous changes.  Fatty infiltration of the liver.  Original Report Authenticated By: Marlon Pel, M.D.     1. Chest pain   2. Anxiety       MDM  Despite having chest pain which is pressure, the patient has an EKG which shows a sinus tachycardia with a pulse of 108, nonspecific ST and T wave abnormalities and no prior EKG is accessible through the electronic medical record on my review at this time. There is no signs of acute ischemia but because of the abnormalities the sinus tachycardia an acute nature of her pain we'll proceed with evaluation for pulmonary embolism as well as cardiac etiology though this is less likely given her cath hx per her report.  The patient refused nitroglycerin, has had complete improvement with Ativan, has no chest pain at this time, has electrolytes showing no significant renal dysfunction, mild liver function test elevation no higher than 66, normal bilirubin, white blood cell count of 12.6. The patient states that she has not had any pain including abdominal pain and on repeat exam has a soft nontender abdomen. I have discussed with her the indications for return, I suspect that she does not have coronary instructions but more likely has  anxiety related chest pain. The daughter states that her mother felt extremely anxious which prompted the chest pain earlier in the evening.  Chest x-ray and CT scan were reviewed showing no signs of thoracic aneurysm, pulmonary  embolism, pneumonia or other ovarian or cardiac etiology. The patient has been explained her results and has expressed her understanding for the indications for followup.   Discharge Prescriptions include:  Ativan  Vida Roller, MD 11/10/11 [phone]

## 2011-11-10 NOTE — ED Notes (Signed)
PT. REPORTS GENERALIZED CHEST PAIN / HEAVINESS WITH SOB AND NAUSEA ONSET THIS EVENING .

## 2011-11-10 NOTE — ED Notes (Signed)
Pt reports rt lower chest pain that radiates to her abd - area is tender to palpation - pt also concerned d/t episode of tachypnea and shortness of breath earlier this evening w/ nausea - pt admits to hx of anxiety as well. Pt in no acute distress at present, resp even and unlabored, reports nausea improved at present. Skin warm and dry. Family at bedside x1.

## 2011-11-10 NOTE — Discharge Instructions (Signed)
Your blood tests and x-rays do not reveal a source of your symptoms. This may be related to anxiety attacks. If you develop severe or worsening symptoms despite taking Ativan, return to the hospital immediately for a recheck. Please see your doctor within the next week for a repeat evaluation and possible scheduling of a repeat stress test. If you take Ativan, do not take your Xanax at home. These medications are similar and can cause serious adverse reactions if taken together. . Your caregiver has diagnosed you as having chest pain that is nonspecific for one problem. This means that after looking at you and examining you and ordering tests (such as blood work, chest x-rays and EKG), your caregiver does not believe that the problem is serious enough to need watching in the hospital. This judgment is often made after testing shows no acute heart attack and you are at low risk for sudden acute heart condition. Chest pain comes from many different causes.  Seek immediate medical attention if:   You have severe chest pain, especially if the pain is crushing or pressure-like and spreads to the arms, back, neck, or jaw, or if you have sweating, nausea, shortness of breath. This is an emergency. Don't wait to see if the pain will go away. Get medical help at once. Call 911 immediately. Do not drive herself to the hospital.   Your chest pain gets worse and does not go away with rest.   You have an attack of chest pain lasting longer than usual, despite rest and treatment with the medications your caregiver has prescribed   You awaken from sleep with chest pain or shortness of breath.   You feel faint or dizzy   You have chest pain not typical of your usual pain for which you originally saw your caregiver.  You must have a repeat evaluation within 24 hours for a recheck of your heart.  Please call your doctor this morning to schedule this appointment. If you do not have a family doctor, please see  the list of doctors below.  RESOURCE GUIDE  Dental Problems  Patients with Medicaid: [facility] [phone] [address]                                           [phone] [address] Phone:  [phone]                                                  Phone:  [phone]  If unable to pay or uninsured, contact:  [facility] or [facility] to become qualified for the adult dental clinic.  Chronic Pain Problems Contact [facility]  [phone] Patients need to be referred by their primary care doctor.  Insufficient Money for Medicine Contact [employer] Way:  call "211" or [facility] 343-078-1381.  No Primary Care Doctor Call [facility]  [phone] Other agencies that provide inexpensive medical care    [facility]  [phone]    [facility] Internal [facility]  [phone]    [facility]  3472124270  Women's Clinic  [phone]    [facility]  [phone]    [facility]  [phone]  Psychological Services [facility] Behavioral [facility]  [phone] [facility]  [phone] [facility]   [phone] (emergency services ([phone])  Substance Abuse Resources [facility]  [phone] [facility] 760-694-4494 [facility] [phone] Floydene Flock (603) [phone] Residential & Outpatient Substance Abuse Program  248-834-1984  Abuse/Neglect [facility] Child Abuse Hotline (670)551-6507 [facility] Child Abuse Hotline 906-799-1903 (After Hours)  Emergency Shelter [facility] (782)826-1631  Maternity Homes [facility] [place] [facility] (816)130-8141 [facility] [phone]  MRSA Hotline #:   317-018-8216    [facility] Resources  [facility]     [employer]                          [facility] Dept. [address] [place]                        [address]      [address]  Blondell Reveal Phone:  [phone]                                   Phone:  [phone]                 Phone:  [phone]  [facility] Mental Health Phone:  [phone]  [facility] Child Abuse Hotline 534 189 2817 [phone])110-2021 (After Hours)

## 2011-11-24 ENCOUNTER — Ambulatory Visit (HOSPITAL_COMMUNITY)
Admission: RE | Admit: 2011-11-24 | Discharge: 2011-11-25 | Disposition: A | Discharge status: Home / Self Care | Payer: Medicaid Other | Source: Ambulatory Visit | Attending: Surgery | Admitting: Surgery

## 2011-11-24 ENCOUNTER — Ambulatory Visit (HOSPITAL_COMMUNITY): Payer: Medicaid Other | Admitting: Anesthesiology

## 2011-11-24 ENCOUNTER — Ambulatory Visit (HOSPITAL_COMMUNITY): Payer: Medicaid Other

## 2011-11-24 ENCOUNTER — Encounter (HOSPITAL_COMMUNITY): Payer: Self-pay | Admitting: Anesthesiology

## 2011-11-24 ENCOUNTER — Encounter (HOSPITAL_COMMUNITY): Payer: Self-pay | Admitting: Surgery

## 2011-11-24 ENCOUNTER — Encounter (HOSPITAL_COMMUNITY): Payer: Self-pay | Admitting: *Deleted

## 2011-11-24 ENCOUNTER — Encounter (HOSPITAL_COMMUNITY)
Admission: RE | Disposition: A | Discharge status: Home / Self Care | Payer: Self-pay | Source: Ambulatory Visit | Attending: Surgery

## 2011-11-24 DIAGNOSIS — J4489 Other specified chronic obstructive pulmonary disease: Secondary | ICD-10-CM | POA: Insufficient documentation

## 2011-11-24 DIAGNOSIS — I10 Essential (primary) hypertension: Secondary | ICD-10-CM | POA: Insufficient documentation

## 2011-11-24 DIAGNOSIS — Z9889 Other specified postprocedural states: Secondary | ICD-10-CM

## 2011-11-24 DIAGNOSIS — K8010 Calculus of gallbladder with chronic cholecystitis without obstruction: Secondary | ICD-10-CM | POA: Insufficient documentation

## 2011-11-24 DIAGNOSIS — J449 Chronic obstructive pulmonary disease, unspecified: Secondary | ICD-10-CM | POA: Insufficient documentation

## 2011-11-24 HISTORY — PX: CHOLECYSTECTOMY: SHX55

## 2011-11-24 LAB — CREATININE, SERUM
Creatinine, Ser: 0.52 mg/dL (ref 0.50–1.10)
GFR calc Af Amer: >90 mL/min (ref >90)
GFR calc non Af Amer: >90 mL/min (ref >90)

## 2011-11-24 LAB — CBC
Platelets: 217 10*3/uL (ref 150–400)
RDW: 17.5 % — ABNORMAL HIGH (ref 11.5–15.5)
WBC: 13.5 10*3/uL — ABNORMAL HIGH (ref 4.0–10.5)

## 2011-11-24 LAB — SURGICAL PCR SCREEN
MRSA, PCR: NEGATIVE
Staphylococcus aureus: NEGATIVE

## 2011-11-24 LAB — GLUCOSE, CAPILLARY: Glucose-Capillary: 155 mg/dL — ABNORMAL HIGH (ref 70–99)

## 2011-11-24 LAB — PREGNANCY, URINE: Preg Test, Ur: NEGATIVE

## 2011-11-24 SURGERY — LAPAROSCOPIC CHOLECYSTECTOMY WITH INTRAOPERATIVE CHOLANGIOGRAM
Anesthesia: General | Site: Abdomen | Wound class: Contaminated

## 2011-11-24 MED ORDER — HEPARIN SODIUM (PORCINE) 5000 UNIT/ML IJ SOLN
5000.0000 [IU] | Freq: Three times a day (TID) | INTRAMUSCULAR | Status: DC
  Administered 2011-11-25: 5000 [IU] via SUBCUTANEOUS
  Filled 2011-11-24 (×4): qty 1

## 2011-11-24 MED ORDER — KCL IN DEXTROSE-NACL 10-5-0.45 MEQ/L-%-% IV SOLN
INTRAVENOUS | Status: DC
  Administered 2011-11-24 – 2011-11-25 (×2): via INTRAVENOUS
  Filled 2011-11-24 (×3): qty 1000

## 2011-11-24 MED ORDER — HYDROCODONE-ACETAMINOPHEN 10-325 MG PO TABS: 1.0000 | ORAL_TABLET | ORAL | Status: DC | PRN

## 2011-11-24 MED ORDER — FENTANYL CITRATE 0.05 MG/ML IJ SOLN
INTRAMUSCULAR | Status: AC
  Filled 2011-11-24: qty 2

## 2011-11-24 MED ORDER — MIDAZOLAM HCL 5 MG/5ML IJ SOLN
INTRAMUSCULAR | Status: DC | PRN
  Administered 2011-11-24: 2 mg via INTRAVENOUS

## 2011-11-24 MED ORDER — LIDOCAINE HCL (CARDIAC) 20 MG/ML IV SOLN
INTRAVENOUS | Status: DC | PRN
  Administered 2011-11-24: 50 mg via INTRAVENOUS

## 2011-11-24 MED ORDER — PROMETHAZINE HCL 25 MG/ML IJ SOLN: 6.2500 mg | INTRAMUSCULAR | Status: DC | PRN

## 2011-11-24 MED ORDER — HYDROMORPHONE HCL PF 1 MG/ML IJ SOLN
1.0000 mg | INTRAMUSCULAR | Status: DC | PRN
  Administered 2011-11-24 (×2): 1 mg via INTRAVENOUS
  Filled 2011-11-24 (×3): qty 1

## 2011-11-24 MED ORDER — NEOSTIGMINE METHYLSULFATE 1 MG/ML IJ SOLN
INTRAMUSCULAR | Status: DC | PRN
  Administered 2011-11-24: 4 mg via INTRAVENOUS

## 2011-11-24 MED ORDER — ONDANSETRON HCL 4 MG PO TABS: 4.0000 mg | ORAL_TABLET | Freq: Four times a day (QID) | ORAL | Status: DC | PRN

## 2011-11-24 MED ORDER — PROPOFOL 10 MG/ML IV EMUL
INTRAVENOUS | Status: DC | PRN
  Administered 2011-11-24: 150 mg via INTRAVENOUS

## 2011-11-24 MED ORDER — KETOROLAC TROMETHAMINE 30 MG/ML IJ SOLN
15.0000 mg | Freq: Once | INTRAMUSCULAR | Status: AC | PRN
  Administered 2011-11-24: 30 mg via INTRAVENOUS

## 2011-11-24 MED ORDER — DEXAMETHASONE SODIUM PHOSPHATE 10 MG/ML IJ SOLN
INTRAMUSCULAR | Status: DC | PRN
  Administered 2011-11-24: 10 mg via INTRAVENOUS

## 2011-11-24 MED ORDER — ONDANSETRON HCL 4 MG/2ML IJ SOLN
INTRAMUSCULAR | Status: DC | PRN
  Administered 2011-11-24: 4 mg via INTRAVENOUS

## 2011-11-24 MED ORDER — HYDROMORPHONE HCL PF 1 MG/ML IJ SOLN: 0.2500 mg | INTRAMUSCULAR | Status: DC | PRN

## 2011-11-24 MED ORDER — IOHEXOL 300 MG/ML  SOLN
INTRAMUSCULAR | Status: DC | PRN
  Administered 2011-11-24: 10 mL via INTRAVENOUS

## 2011-11-24 MED ORDER — PROMETHAZINE HCL 25 MG/ML IJ SOLN
6.2500 mg | INTRAMUSCULAR | Status: DC | PRN
  Administered 2011-11-24: 6.25 mg via INTRAVENOUS

## 2011-11-24 MED ORDER — LACTATED RINGERS IV SOLN
INTRAVENOUS | Status: DC
  Administered 2011-11-24: 11:00:00 via INTRAVENOUS
  Administered 2011-11-24: 1000 mL via INTRAVENOUS

## 2011-11-24 MED ORDER — INSULIN ASPART 100 UNIT/ML ~~LOC~~ SOLN
0.0000 [IU] | Freq: Three times a day (TID) | SUBCUTANEOUS | Status: DC
  Administered 2011-11-24: 7 [IU] via SUBCUTANEOUS
  Administered 2011-11-24: 3 [IU] via SUBCUTANEOUS
  Administered 2011-11-25: 2 [IU] via SUBCUTANEOUS

## 2011-11-24 MED ORDER — KETOROLAC TROMETHAMINE 30 MG/ML IJ SOLN
30.0000 mg | Freq: Four times a day (QID) | INTRAMUSCULAR | Status: DC
  Administered 2011-11-24 – 2011-11-25 (×4): 30 mg via INTRAVENOUS
  Filled 2011-11-24 (×8): qty 1

## 2011-11-24 MED ORDER — IMIPRAMINE HCL 50 MG PO TABS
50.0000 mg | ORAL_TABLET | Freq: Every day | ORAL | Status: DC
  Administered 2011-11-24: 50 mg via ORAL
  Filled 2011-11-24 (×2): qty 1

## 2011-11-24 MED ORDER — FENTANYL CITRATE 0.05 MG/ML IJ SOLN: 25.0000 ug | INTRAMUSCULAR | Status: DC | PRN

## 2011-11-24 MED ORDER — CIPROFLOXACIN IN D5W 400 MG/200ML IV SOLN
400.0000 mg | INTRAVENOUS | Status: AC
  Administered 2011-11-24: 400 mg via INTRAVENOUS

## 2011-11-24 MED ORDER — FENTANYL CITRATE 0.05 MG/ML IJ SOLN
25.0000 ug | INTRAMUSCULAR | Status: DC | PRN
  Administered 2011-11-24 (×3): 50 ug via INTRAVENOUS

## 2011-11-24 MED ORDER — FLUOXETINE HCL 40 MG PO CAPS: 40.0000 mg | ORAL_CAPSULE | Freq: Every day | ORAL | Status: DC

## 2011-11-24 MED ORDER — SUCCINYLCHOLINE CHLORIDE 20 MG/ML IJ SOLN
INTRAMUSCULAR | Status: DC | PRN
  Administered 2011-11-24: 100 mg via INTRAVENOUS

## 2011-11-24 MED ORDER — ALBUTEROL SULFATE HFA 108 (90 BASE) MCG/ACT IN AERS
2.0000 | INHALATION_SPRAY | RESPIRATORY_TRACT | Status: DC | PRN
  Administered 2011-11-24: 2 via RESPIRATORY_TRACT
  Filled 2011-11-24: qty 6.7

## 2011-11-24 MED ORDER — FENTANYL CITRATE 0.05 MG/ML IJ SOLN
INTRAMUSCULAR | Status: DC | PRN
  Administered 2011-11-24: 100 ug via INTRAVENOUS
  Administered 2011-11-24: 50 ug via INTRAVENOUS
  Administered 2011-11-24: 100 ug via INTRAVENOUS

## 2011-11-24 MED ORDER — PANTOPRAZOLE SODIUM 40 MG PO TBEC
40.0000 mg | DELAYED_RELEASE_TABLET | Freq: Every day | ORAL | Status: DC
  Administered 2011-11-24: 40 mg via ORAL
  Filled 2011-11-24 (×2): qty 1

## 2011-11-24 MED ORDER — PROMETHAZINE HCL 25 MG/ML IJ SOLN
INTRAMUSCULAR | Status: AC
  Filled 2011-11-24: qty 1

## 2011-11-24 MED ORDER — LACTATED RINGERS IR SOLN
Status: DC | PRN
  Administered 2011-11-24: 1000 mL

## 2011-11-24 MED ORDER — BUPIVACAINE-EPINEPHRINE 0.25% -1:200000 IJ SOLN
INTRAMUSCULAR | Status: AC
  Filled 2011-11-24: qty 1

## 2011-11-24 MED ORDER — KETOROLAC TROMETHAMINE 30 MG/ML IJ SOLN
INTRAMUSCULAR | Status: AC
  Filled 2011-11-24: qty 1

## 2011-11-24 MED ORDER — FLUOXETINE HCL 20 MG PO CAPS
40.0000 mg | ORAL_CAPSULE | Freq: Every day | ORAL | Status: DC
  Administered 2011-11-25: 40 mg via ORAL
  Filled 2011-11-24: qty 2

## 2011-11-24 MED ORDER — HYDROCODONE-ACETAMINOPHEN 5-325 MG PO TABS
1.0000 | ORAL_TABLET | ORAL | Status: DC | PRN
  Administered 2011-11-24 – 2011-11-25 (×3): 2 via ORAL
  Filled 2011-11-24 (×3): qty 2

## 2011-11-24 MED ORDER — LACTATED RINGERS IV SOLN
INTRAVENOUS | Status: DC | PRN
  Administered 2011-11-24 (×2): via INTRAVENOUS

## 2011-11-24 MED ORDER — ROCURONIUM BROMIDE 100 MG/10ML IV SOLN
INTRAVENOUS | Status: DC | PRN
  Administered 2011-11-24: 10 mg via INTRAVENOUS
  Administered 2011-11-24: 50 mg via INTRAVENOUS

## 2011-11-24 MED ORDER — BUPIVACAINE-EPINEPHRINE 0.25% -1:200000 IJ SOLN
INTRAMUSCULAR | Status: DC | PRN
  Administered 2011-11-24: 18 mL

## 2011-11-24 MED ORDER — ONDANSETRON HCL 4 MG/2ML IJ SOLN: 4.0000 mg | Freq: Four times a day (QID) | INTRAMUSCULAR | Status: DC | PRN

## 2011-11-24 MED ORDER — GLYCOPYRROLATE 0.2 MG/ML IJ SOLN
INTRAMUSCULAR | Status: DC | PRN
  Administered 2011-11-24: 0.6 mg via INTRAVENOUS

## 2011-11-24 MED ORDER — CIPROFLOXACIN IN D5W 400 MG/200ML IV SOLN
INTRAVENOUS | Status: AC
  Filled 2011-11-24: qty 200

## 2011-11-24 MED ORDER — IOHEXOL 300 MG/ML  SOLN
INTRAMUSCULAR | Status: AC
  Filled 2011-11-24: qty 1

## 2011-11-24 MED ORDER — HYDROXYZINE HCL 25 MG PO TABS
25.0000 mg | ORAL_TABLET | Freq: Two times a day (BID) | ORAL | Status: DC | PRN
  Filled 2011-11-24: qty 1

## 2011-11-24 MED ORDER — IRBESARTAN 75 MG PO TABS
75.0000 mg | ORAL_TABLET | Freq: Every day | ORAL | Status: DC
  Administered 2011-11-24 – 2011-11-25 (×2): 75 mg via ORAL
  Filled 2011-11-24 (×3): qty 1

## 2011-11-24 SURGICAL SUPPLY — 42 items
ADH SKN CLS APL DERMABOND .7 (GAUZE/BANDAGES/DRESSINGS) ×1
APPLIER CLIP 5 13 M/L LIGAMAX5 (MISCELLANEOUS) ×4
APPLIER CLIP ROT 10 11.4 M/L (STAPLE) ×2
APR CLP MED LRG 11.4X10 (STAPLE) ×1
APR CLP MED LRG 5 ANG JAW (MISCELLANEOUS) ×2
BAG SPEC RTRVL LRG 6X4 10 (ENDOMECHANICALS) ×1
CANISTER SUCTION 2500CC (MISCELLANEOUS) ×2 IMPLANT
CHLORAPREP W/TINT 26ML (MISCELLANEOUS) ×2 IMPLANT
CLIP APPLIE 5 13 M/L LIGAMAX5 (MISCELLANEOUS) IMPLANT
CLIP APPLIE ROT 10 11.4 M/L (STAPLE) ×1 IMPLANT
CLOTH BEACON ORANGE TIMEOUT ST (SAFETY) ×2 IMPLANT
COVER MAYO STAND STRL (DRAPES) ×2 IMPLANT
DECANTER SPIKE VIAL GLASS SM (MISCELLANEOUS) ×2 IMPLANT
DERMABOND ADVANCED (GAUZE/BANDAGES/DRESSINGS) ×1
DERMABOND ADVANCED .7 DNX12 (GAUZE/BANDAGES/DRESSINGS) IMPLANT
DRAPE C-ARM 42X72 X-RAY (DRAPES) ×2 IMPLANT
DRAPE LAPAROSCOPIC ABDOMINAL (DRAPES) ×2 IMPLANT
DRAPE WARM FLUID 44X44 (DRAPE) ×2 IMPLANT
ELECT REM PT RETURN 9FT ADLT (ELECTROSURGICAL) ×2
ELECTRODE REM PT RTRN 9FT ADLT (ELECTROSURGICAL) ×1 IMPLANT
FILTER SMOKE EVAC LAPAROSHD (FILTER) ×2 IMPLANT
GLOVE BIOGEL PI IND STRL 7.0 (GLOVE) ×1 IMPLANT
GLOVE BIOGEL PI INDICATOR 7.0 (GLOVE) ×1
GLOVE INDICATOR 8.0 STRL GRN (GLOVE) ×4 IMPLANT
GLOVE SS BIOGEL STRL SZ 8 (GLOVE) ×1 IMPLANT
GLOVE SUPERSENSE BIOGEL SZ 8 (GLOVE) ×1
GOWN STRL NON-REIN LRG LVL3 (GOWN DISPOSABLE) ×3 IMPLANT
GOWN STRL REIN XL XLG (GOWN DISPOSABLE) ×4 IMPLANT
HEMOSTAT SNOW SURGICEL 2X4 (HEMOSTASIS) ×2 IMPLANT
HEMOSTAT SURGICEL 4X8 (HEMOSTASIS) IMPLANT
KIT BASIN OR (CUSTOM PROCEDURE TRAY) ×2 IMPLANT
POUCH SPECIMEN RETRIEVAL 10MM (ENDOMECHANICALS) ×1 IMPLANT
SCISSORS LAP 5X35 DISP (ENDOMECHANICALS) ×1 IMPLANT
SET CHOLANGIOGRAPH MIX (MISCELLANEOUS) ×2 IMPLANT
SET IRRIG TUBING LAPAROSCOPIC (IRRIGATION / IRRIGATOR) ×2 IMPLANT
SUT MNCRL AB 4-0 PS2 18 (SUTURE) ×2 IMPLANT
TOWEL OR 17X26 10 PK STRL BLUE (TOWEL DISPOSABLE) ×2 IMPLANT
TRAY LAP CHOLE (CUSTOM PROCEDURE TRAY) ×2 IMPLANT
TROCAR BLADELESS OPT 5 75 (ENDOMECHANICALS) ×6 IMPLANT
TROCAR XCEL BLUNT TIP 100MML (ENDOMECHANICALS) ×2 IMPLANT
TROCAR XCEL NON-BLD 11X100MML (ENDOMECHANICALS) ×2 IMPLANT
TUBING INSUFFLATION 10FT LAP (TUBING) ×2 IMPLANT

## 2011-11-24 NOTE — Transfer of Care (Signed)
Immediate Anesthesia Transfer of Care Note  Patient: [name]  Procedure(s) Performed: Procedure(s) (LRB): LAPAROSCOPIC CHOLECYSTECTOMY WITH INTRAOPERATIVE CHOLANGIOGRAM (N/A)  Patient Location: PACU  Anesthesia Type: General  Level of Consciousness: awake and alert   Airway & Oxygen Therapy: Patient Spontanous Breathing and Patient connected to face mask oxygen  Post-op Assessment: Report given to PACU RN and Post -op Vital signs reviewed and stable  Post vital signs: Reviewed and stable  Complications: No apparent anesthesia complications

## 2011-11-24 NOTE — H&P (Signed)
[name]    MRN: [id number]   Description: 51 year old female  Provider: Dortha Schwalbe., MD  Department: [facility]        Diagnoses     Gallstones and inflammation of gallbladder without obstruction   - Primary    574.00         Vitals - Last Recorded       BP Pulse Temp Ht Wt BMI    152/96  86  98.3 F (36.8 C)  5\' 8"  (1.727 m)  186 lb 3.2 oz (84.46 kg)  28.31 kg/m2          LMP                                 Patient ID: [name], female   DOB: May 05, 1961, 51 y.o.   MRN: [id number]   No chief complaint on file.   HPI [name] is a 51 y.o. female.   HPI Patient presents at the request of Dr. Clovis Riley due to epigastric pain. She has a history of epigastric pain. He comes and goes. Her last about 3 minutes and is sharp in nature and radiates to her mid back. She was seen in the emergency room 2 weeks ago for this. She had a workup for pulmonary embolus which was negative. CT showed gallstones. She underwent ultrasound which showed gallstones. Her liver function studies were mildly elevated her present to the emergency room and her lipase was mildly elevated. Hepatitis profile negative. The pain is associated with nausea without vomiting. No foods seem to elicit the attacks.    Past Medical History   Diagnosis  Date   .  Hypertension     .  Anxiety     .  Gallstones     .  GERD (gastroesophageal reflux disease)     .  COPD (chronic obstructive pulmonary disease)        No past surgical history on file.    Family History   Problem  Relation  Age of Onset   .  Breast cancer  Sister     .  Lung cancer  Father        Social History History   Substance Use Topics   .  Smoking status:  Current Everyday Smoker   .  Smokeless tobacco:  Not on file   .  Alcohol Use:  No       Allergies   Allergen  Reactions   .  Biaxin  Shortness Of Breath and Swelling   .  Sulfa Antibiotics  Shortness Of Breath and Hypertension   .  Aspirin   Nausea And Vomiting   .  Augmentin  Diarrhea   .  Dilaudid (Hydromorphone Hcl)  Nausea And Vomiting   .  Lisinopril  Cough   .  Latex  Hives and Rash       Current Outpatient Prescriptions   Medication  Sig  Dispense  Refill   .  albuterol (PROVENTIL HFA;VENTOLIN HFA) 108 (90 BASE) MCG/ACT inhaler  Inhale 2 puffs into the lungs 2 (two) times daily.         Marland Kitchen  ALPRAZolam (XANAX) 0.5 MG tablet  Take 0.5 mg by mouth at bedtime as needed. Using more due to itching         .  doxycycline (VIBRA-TABS) 100 MG tablet  Take 100 mg by mouth 2 (  two) times daily.         Marland Kitchen  FLUoxetine (PROZAC) 40 MG capsule  Take 40 mg by mouth daily.         Marland Kitchen  HYDROcodone-acetaminophen (VICODIN) 5-500 MG per tablet  Take 1 tablet by mouth 2 (two) times daily as needed. For pain up to twice daily         .  hydrOXYzine (ATARAX/VISTARIL) 25 MG tablet  Take 25 mg by mouth 2 (two) times daily as needed. For itching         .  ibuprofen (ADVIL,MOTRIN) 800 MG tablet  Take 800 mg by mouth every 8 (eight) hours as needed. For pain         .  imipramine (TOFRANIL) 25 MG tablet  Take 50 mg by mouth at bedtime.         Marland Kitchen  loperamide (IMODIUM A-D) 2 MG tablet  Take 2-4 mg by mouth 4 (four) times daily as needed. diarrhea         .  loratadine (CLARITIN) 10 MG tablet  Take 10 mg by mouth daily.         Marland Kitchen  omeprazole (PRILOSEC) 20 MG capsule  Take 20 mg by mouth daily.         .  valsartan (DIOVAN) 160 MG tablet  Take 160 mg by mouth daily.            Review of Systems Review of Systems  Constitutional: Negative.   HENT: Negative.   Eyes: Negative.   Respiratory: Negative.   Cardiovascular: Negative.   Gastrointestinal: Positive for nausea and abdominal pain. Negative for abdominal distention.  Genitourinary: Negative.   Musculoskeletal: Negative.   Skin: Negative.   Neurological: Negative.   Hematological: Negative.   Psychiatric/Behavioral: Negative.     Blood pressure 152/96, pulse 86, temperature 98.3 F (36.8  C), height 5\' 8"  (1.727 m), weight 186 lb 3.2 oz (84.46 kg), .   Physical Exam Physical Exam  Constitutional: She is oriented to person, place, and time. She appears well-developed and well-nourished.  HENT:   Head: Normocephalic and atraumatic.  Eyes: EOM are normal. Pupils are equal, round, and reactive to light.  Neck: Normal range of motion. Neck supple.  Cardiovascular: Normal rate and regular rhythm.   Pulmonary/Chest: Effort normal and breath sounds normal.  Abdominal: Soft. Bowel sounds are normal. She exhibits no distension. There is no tenderness. There is no rebound.  Musculoskeletal: Normal range of motion.  Neurological: She is alert and oriented to person, place, and time.  Skin: Skin is warm and dry.  Psychiatric: She has a normal mood and affect. Her behavior is normal. Judgment and thought content normal.    Data Reviewed U/S   Gallstones no CBD dilation.  Questionable kidney stone on right. CT chest no PE gallstones.  LFT slightly elevate.  Lipase slightly elevated.   Assessment Symptomatic cholelithiasis   Mild elevation LFTs   Mild elevation of lipase   Hypertension   Tobacco abuse   Plan   The procedure has been discussed with the patient. Operative and non operative treatments have been discussed. Risks of surgery include bleeding, infection,  Common bile duct injury,  Injury to the stomach,liver, colon,small intestine, abdominal wall,  Diaphragm,  Major blood vessels,  And the need for an open procedure.  Other risks include worsening of medical problems, death,  DVT and pulmonary embolism, and cardiovascular events.   Medical options have also been discussed. The patient has  been informed of long term expectations of surgery and non surgical options,  The patient agrees to proceed.         Teisha Trowbridge A. 11/24/2011

## 2011-11-24 NOTE — Interval H&P Note (Signed)
History and Physical Interval Note:  11/24/2011 7:53 AM  [name]  has presented today for surgery, with the diagnosis of gallstones  The various methods of treatment have been discussed with the patient and family. After consideration of risks, benefits and other options for treatment, the patient has consented to  Procedure(s) (LRB): LAPAROSCOPIC CHOLECYSTECTOMY WITH INTRAOPERATIVE CHOLANGIOGRAM (N/A) as a surgical intervention .  The patients' history has been reviewed, patient examined, no change in status, stable for surgery.  I have reviewed the patients' chart and labs.  Questions were answered to the patient's satisfaction.     [name] A.

## 2011-11-24 NOTE — Anesthesia Postprocedure Evaluation (Signed)
  Anesthesia Post-op Note  Patient: [name]  Procedure(s) Performed: Procedure(s) (LRB): LAPAROSCOPIC CHOLECYSTECTOMY WITH INTRAOPERATIVE CHOLANGIOGRAM (N/A)  Patient Location: PACU  Anesthesia Type: General  Level of Consciousness: awake and alert   Airway and Oxygen Therapy: Patient Spontanous Breathing  Post-op Pain: mild  Post-op Assessment: Post-op Vital signs reviewed, Patient's Cardiovascular Status Stable, Respiratory Function Stable, Patent Airway and No signs of Nausea or vomiting  Post-op Vital Signs: stable  Complications: No apparent anesthesia complications

## 2011-11-24 NOTE — Anesthesia Preprocedure Evaluation (Signed)
Anesthesia Evaluation  Patient identified by MRN, date of birth, ID band Patient awake    Reviewed: Allergy & Precautions, H&P , NPO status , Patient's Chart, lab work & pertinent test results  Airway Mallampati: II TM Distance: >3 FB Neck ROM: Full    Dental No notable dental hx.    Pulmonary COPD breath sounds clear to auscultation  Pulmonary exam normal       Cardiovascular hypertension, Pt. on medications negative cardio ROS  Rhythm:Regular Rate:Normal     Neuro/Psych negative neurological ROS  negative psych ROS   GI/Hepatic negative GI ROS, Neg liver ROS,   Endo/Other  negative endocrine ROS  Renal/GU negative Renal ROS  negative genitourinary   Musculoskeletal negative musculoskeletal ROS (+)   Abdominal   Peds negative pediatric ROS (+)  Hematology negative hematology ROS (+)   Anesthesia Other Findings   Reproductive/Obstetrics negative OB ROS                           Anesthesia Physical Anesthesia Plan  ASA: II  Anesthesia Plan: General   Post-op Pain Management:    Induction: Intravenous  Airway Management Planned: Oral ETT  Additional Equipment:   Intra-op Plan:   Post-operative Plan: Extubation in OR  Informed Consent: I have reviewed the patients History and Physical, chart, labs and discussed the procedure including the risks, benefits and alternatives for the proposed anesthesia with the patient or authorized representative who has indicated his/her understanding and acceptance.   Dental advisory given  Plan Discussed with: CRNA  Anesthesia Plan Comments:         Anesthesia Quick Evaluation

## 2011-11-24 NOTE — Op Note (Signed)
Laparoscopic Cholecystectomy with IOC Procedure Note  Indications: This patient presents with symptomatic gallbladder disease and will undergo laparoscopic cholecystectomy.  Pre-operative Diagnosis: Calculus of gallbladder without mention of cholecystitis, with obstruction  Post-operative Diagnosis: Same  Surgeon: Marialuiza Car A.   Assistants: OR  Anesthesia: General endotracheal anesthesia and Local anesthesia 0.25.% bupivacaine, with epinephrine  ASA Class: 3  Procedure Details  The patient was seen again in the Holding Room. The risks, benefits, complications, treatment options, and expected outcomes were discussed with the patient. The possibilities of reaction to medication, pulmonary aspiration, perforation of viscus, bleeding, recurrent infection, finding a normal gallbladder, the need for additional procedures, failure to diagnose a condition, the possible need to convert to an open procedure, and creating a complication requiring transfusion or operation were discussed with the patient. The patient and/or family concurred with the proposed plan, giving informed consent. The site of surgery properly noted/marked. The patient was taken to Operating Room, identified as [name] and the procedure verified as Laparoscopic Cholecystectomy with Intraoperative Cholangiograms. A Time Out was held and the above information confirmed.  Prior to the induction of general anesthesia, antibiotic prophylaxis was administered. General endotracheal anesthesia was then administered and tolerated well. After the induction, the abdomen was prepped in the usual sterile fashion. The patient was positioned in the supine position with the left arm comfortably tucked, along with some reverse Trendelenburg.  Local anesthetic agent was injected into the skin near the umbilicus and an incision made. The midline fascia was incised and the Hasson technique was used to introduce a 10 mm port under direct  vision. It was secured with a figure of eight Vicryl suture placed in the usual fashion. Pneumoperitoneum was then created with CO2 and tolerated well without any adverse changes in the patient's vital signs. Additional trocars were introduced under direct vision. All skin incisions were infiltrated with a local anesthetic agent before making the incision and placing the trocars. An extra LUOQ 5 mm trocar and epigastric 5 mm port introduced due to hepatomegaly secondary to fatty infiltration.  It was difficult to visualize the infundibulum of the gallbladder and a squiggly retractor was used.     The gallbladder was identified, the fundus grasped and retracted cephalad. The gallbladder was very friable and fell apart.   There was no acute inflammation  and tissue quality was very poor..Adhesions were lysed bluntly and with the electrocautery where indicated, taking care not to injure any adjacent organs or viscus. The infundibulum was grasped and retracted laterally, exposing the peritoneum overlying the triangle of Calot. This was then divided and exposed in a blunt fashion. The cystic duct was clearly identified and bluntly dissected circumferentially. The junctions of the gallbladder, cystic duct and common bile duct were clearly identified prior to the division of any linear structure.   An incision was made in the cystic duct and the cholangiogram catheter introduced. The catheter was secured using an endoclip. The study showed no stones and good visualization of the distal and proximal biliary tree. The catheter was then removed.   The cystic duct was then ligated with surgical clips  on the patient side and singly clipped on the gallbladder side and divided. The cystic artery was identified, dissected free, ligated with clips and divided as well.   Posterior cystic artery clipped and divided.  The gallbladder was dissected from the liver bed in retrograde fashion with the electrocautery. The  gallbladder was removed. The liver bed was irrigated and inspected. Hemostasis was  achieved with the electrocautery. Copious irrigation was utilized and was repeatedly aspirated until clear all particulate matter.  Surgecel placed in gallbladder bed and 19 F drain used due to poor tissue quality.  Secured to skin with 2 0 nylon.  Drain came out lateral port site.  Pneumoperitoneum was completely reduced after viewing removal of the trocars under direct vision. The wound was thoroughly irrigated and the fascia was then closed with a figure of eight suture; the skin was then closed with 4 0 monocryl and a sterile dressing was applied. Dermabond for skin.  Drain placed to bulb suction.  Instrument, sponge, and needle counts were correct at closure and at the conclusion of the case.   Findings: Cholecystitis without Cholelithiasis  Estimated Blood Loss: less than 100 mL         Drains: 19 F         Total IV Fluids: 800 mL         Specimens: Gallbladder           Complications: None; patient tolerated the procedure well.         Disposition: PACU - hemodynamically stable.         Condition: stable

## 2011-11-25 ENCOUNTER — Encounter (HOSPITAL_COMMUNITY): Payer: Self-pay | Admitting: Surgery

## 2011-11-25 LAB — COMPREHENSIVE METABOLIC PANEL
AST: 111 U/L — ABNORMAL HIGH (ref 0–37)
Albumin: 3 g/dL — ABNORMAL LOW (ref 3.5–5.2)
Calcium: 8.7 mg/dL (ref 8.4–10.5)
Creatinine, Ser: 0.6 mg/dL (ref 0.50–1.10)

## 2011-11-25 LAB — GLUCOSE, CAPILLARY

## 2011-11-25 MED ORDER — OXYCODONE-ACETAMINOPHEN 5-325 MG PO TABS: 1.0000 | ORAL_TABLET | ORAL | Status: DC | PRN

## 2011-11-25 MED FILL — Mupirocin Oint 2%: CUTANEOUS | Qty: 22 | Status: AC

## 2011-11-25 NOTE — Progress Notes (Signed)
Patient is alert and oriented, vital signs are stable, incision to abd are within normal limits, IV removed, patient educated on JP drain and given Input and output sheet to measure output, awaiting ride from sister, will reviewed discharge instructions after patient eats lunch per patient preference, will continue to monitor Means, Myrtie Hawk RN 11-25-11 10:52am

## 2011-11-25 NOTE — Discharge Summary (Signed)
Physician Discharge Summary  Patient ID: [name] MRN: [id number] DOB/AGE: 1961-02-17 51 y.o.  Admit date: 11/24/2011 Discharge date: 11/25/2011  Admission Diagnoses:  cholelithiasis  Discharge Diagnoses: same Active Problems:  * No active hospital problems. *   Past Medical History  Diagnosis Date  . Hypertension   . Anxiety   . Gallstones   . GERD (gastroesophageal reflux disease)   . COPD (chronic obstructive pulmonary disease)   . Dysrhythmia     pt unsure of name of arrythmia - " my heart rate will drop all of a sudden" -no current treatment   . Headache     migraines  . Rash     arms  . Arthritis   . Depression    Discharged Condition: good  Hospital Course: Unremarkable.  Eating regular diet with adequate pain control.  Drainage from JP minimal.  Consults: None  Significant Diagnostic Studies: none  Treatments: surgery: lap chole and IOC  Discharge Exam: Blood pressure 142/83, pulse 75, temperature 98.1 F (36.7 C), temperature source Oral, resp. rate 17, height 5' 7.5" (1.715 m), weight 196 lb 1.6 oz (88.95 kg), last menstrual period 06/09/2011, SpO2 99.00%. GI: SOFT NON TENDER WOUND CLEAN JP SEROUS  Disposition: 01-Home or Self Care  Discharge Orders    Future Appointments: Provider: Department: Dept Phone: Center:   12/10/2011 10:00 AM Maisie Fus A. Aahil Fredin, MD [facility] [phone] None     Future Orders Please Complete By Expires   Diet - low sodium heart healthy      Increase activity slowly        Medication List  As of 11/25/2011  8:59 AM   STOP taking these medications         HYDROcodone-acetaminophen 5-500 MG per tablet         TAKE these medications         albuterol 108 (90 BASE) MCG/ACT inhaler   Commonly known as: PROVENTIL HFA;VENTOLIN HFA   Inhale 2 puffs into the lungs every 4 (four) hours as needed. Wheezing        ALPRAZolam 0.5 MG tablet   Commonly known as: XANAX   Take 0.5 mg by mouth at bedtime as needed.  Anxiety        doxycycline 100 MG tablet   Commonly known as: VIBRA-TABS   Take 100 mg by mouth 2 (two) times daily.      estrogen (conjugated)-medroxyprogesterone 0.45-1.5 MG per tablet   Commonly known as: PREMPRO   Take 1 tablet by mouth daily.      FLUoxetine 40 MG capsule   Commonly known as: PROZAC   Take 40 mg by mouth daily with breakfast.      hydrOXYzine 25 MG tablet   Commonly known as: ATARAX/VISTARIL   Take 25 mg by mouth 2 (two) times daily as needed. For itching      ibuprofen 800 MG tablet   Commonly known as: ADVIL,MOTRIN   Take 800 mg by mouth every 8 (eight) hours as needed. For pain      imipramine 25 MG tablet   Commonly known as: TOFRANIL   Take 50 mg by mouth at bedtime.      loperamide 2 MG tablet   Commonly known as: IMODIUM A-D   Take 2-4 mg by mouth 4 (four) times daily as needed. diarrhea      loratadine 10 MG tablet   Commonly known as: CLARITIN   Take 10 mg by mouth daily.  omeprazole 20 MG capsule   Commonly known as: PRILOSEC   Take 20 mg by mouth daily.      oxyCODONE-acetaminophen 5-325 MG per tablet   Commonly known as: PERCOCET   Take 1 tablet by mouth every 4 (four) hours as needed for pain.      valsartan 160 MG tablet   Commonly known as: DIOVAN   Take 160 mg by mouth daily with breakfast.           Follow-up Information    Follow up with Tajon Moring A., MD in 1 week.   Contact information:   [employer] [address] [place] [address] [phone]          Signed: [name]. 11/25/2011, 8:59 AM

## 2011-11-25 NOTE — Discharge Instructions (Signed)
20 LADORIS LYTHGOE  10/19/2011   Your procedure is scheduled on:  11/24/11  Report to SHORT STAY DEPT  at 6:00 AM.  Call this number if you have problems the morning of surgery: [phone]   Remember:   Do not eat food or drink liquids AFTER MIDNIGHT    Take these medicines the morning of surgery with A SIP OF WATER: DOXYCYCLINE / PROZAC / CLARITIN / PRILOSEC / VICODAN OR XANAX IF NEEDED   Do not wear jewelry, make-up or nail polish.  Do not wear lotions, powders, or perfumes.   Do not shave legs or underarms 48 hrs. before surgery (men may shave face)  Do not bring valuables to the hospital.  Contacts, dentures or bridgework may not be worn into surgery.  Leave suitcase in the car. After surgery it may be brought to your room.  For patients admitted to the hospital, checkout time is 11:00 AM the day of discharge.   Patients discharged the day of surgery will not be allowed to drive home.    Special Instructions:   Please read over the following fact sheets that you were given: MRSA  Information               SHOWER WITH BETASEPT THE NIGHT BEFORE SURGERY AND THE MORNING OF SURGERY   [facility] ______[facility] LAPAROSCOPIC SURGERY: POST OP INSTRUCTIONS Always review your discharge instruction sheet given to you by the facility where your surgery was performed. IF YOU HAVE DISABILITY OR FAMILY LEAVE FORMS, YOU MUST BRING THEM TO THE OFFICE FOR PROCESSING.   DO NOT GIVE THEM TO YOUR DOCTOR.  1. A prescription for pain medication may be given to you upon discharge.  Take your pain medication as prescribed, if needed.  If narcotic pain medicine is not needed, then you may take acetaminophen (Tylenol) or ibuprofen (Advil) as needed. 2. Take your usually prescribed medications unless otherwise directed. 3. If you need a refill on your pain medication, please contact your pharmacy.  They will contact our office to request authorization. Prescriptions will not be filled after  5pm or on week-ends. 4. You should follow a light diet the first few days after arrival home, such as soup and crackers, etc.  Be sure to include lots of fluids daily. 5. Most patients will experience some swelling and bruising in the area of the incisions.  Ice packs will help.  Swelling and bruising can take several days to resolve.  6. It is common to experience some constipation if taking pain medication after surgery.  Increasing fluid intake and taking a stool softener (such as Colace) will usually help or prevent this problem from occurring.  A mild laxative (Milk of Magnesia or Miralax) should be taken according to package instructions if there are no bowel movements after 48 hours. 7. Unless discharge instructions indicate otherwise, you may remove your bandages 24-48 hours after surgery, and you may shower at that time.  You may have steri-strips (small skin tapes) in place directly over the incision.  These strips should be left on the skin for 7-10 days.  If your surgeon used skin glue on the incision, you may shower in 24 hours.  The glue will flake off over the next 2-3 weeks.  Any sutures or staples will be removed at the office during your follow-up visit. 8. ACTIVITIES:  You may resume regular (light) daily activities beginning the next day--such as daily self-care, walking, climbing stairs--gradually increasing activities as tolerated.  You may have sexual intercourse when it is comfortable.  Refrain from any heavy lifting or straining until approved by your doctor. a. You may drive when you are no longer taking prescription pain medication, you can comfortably wear a seatbelt, and you can safely maneuver your car and apply brakes. b. RETURN TO WORK:  __________________________________________________________ 9. You should see your doctor in the office for a follow-up appointment approximately 2-3 weeks after your surgery.  Make sure that you call for this appointment within a day or two  after you arrive home to insure a convenient appointment time. 10. OTHER INSTRUCTIONS: __________________________________________________________________________________________________________________________ __________________________________________________________________________________________________________________________ WHEN TO CALL YOUR DOCTOR: 1. Fever over 101.0 2. Inability to urinate 3. Continued bleeding from incision. 4. Increased pain, redness, or drainage from the incision. 5. Increasing abdominal pain  The clinic staff is available to answer your questions during regular business hours.  Please don't hesitate to call and ask to speak to one of the nurses for clinical concerns.  If you have a medical emergency, go to the nearest emergency room or call 911.  A surgeon from [facility] is always on call at the hospital. [address], [address], [place], [place]  [address] ? [address] [address], [place], [place]   [address] [phone] ? [phone] ? FAX [phone] Web site: [email or web address]   EMPTY DRAIN DAILY.  OK TO SHOWER.

## 2011-11-25 NOTE — Progress Notes (Signed)
Patient discharged home, discharge instructions reviewed with patient and family, questions and concerns answered, pt educated of JP drain, pt to follow up with MD within 1 week Means, Myrtie Hawk RN 11-25-11 12:13pm

## 2011-11-30 ENCOUNTER — Encounter (INDEPENDENT_AMBULATORY_CARE_PROVIDER_SITE_OTHER): Payer: Self-pay | Admitting: Surgery

## 2011-11-30 ENCOUNTER — Ambulatory Visit (INDEPENDENT_AMBULATORY_CARE_PROVIDER_SITE_OTHER): Payer: Medicaid Other | Admitting: Surgery

## 2011-11-30 VITALS — BP 110/77 | HR 112 | Temp 95.9°F | Ht 67.5 in | Wt 187.0 lb

## 2011-11-30 DIAGNOSIS — Z9889 Other specified postprocedural states: Secondary | ICD-10-CM

## 2011-11-30 MED ORDER — OXYCODONE-ACETAMINOPHEN 5-325 MG PO TABS: 1.0000 | ORAL_TABLET | ORAL | Status: AC | PRN

## 2011-11-30 NOTE — Patient Instructions (Signed)
Return in 1 - 2 weeks.  Walk daily.  Continue to not smoke.

## 2011-11-30 NOTE — Progress Notes (Signed)
The patient returns 5 days after laparoscopic cholecystectomy. She is having some discomfort and her JP drain site. The drainage is serous sanguinous. There is no bile or contamination. It looks like there's been 20-30 cc a day.  Exam: Abdomen soft. Port sites are clean dry and intact. I removed her JP without difficulty.  Impression: Status post laparoscopic cholecystectomy with cholangiogram for chronic cholecystitis  Plan: Return next week for recheck. I refilled her pain medicine. Continue smoking cessation.

## 2011-12-03 ENCOUNTER — Encounter (INDEPENDENT_AMBULATORY_CARE_PROVIDER_SITE_OTHER): Payer: Medicaid Other | Admitting: Surgery

## 2011-12-10 ENCOUNTER — Ambulatory Visit (INDEPENDENT_AMBULATORY_CARE_PROVIDER_SITE_OTHER): Payer: Medicaid Other | Admitting: Surgery

## 2011-12-10 ENCOUNTER — Encounter (INDEPENDENT_AMBULATORY_CARE_PROVIDER_SITE_OTHER): Payer: Self-pay | Admitting: Surgery

## 2011-12-10 VITALS — BP 126/86 | HR 72 | Temp 98.3°F | Resp 14 | Ht 67.5 in | Wt 184.4 lb

## 2011-12-10 DIAGNOSIS — Z9889 Other specified postprocedural states: Secondary | ICD-10-CM | POA: Insufficient documentation

## 2011-12-10 NOTE — Progress Notes (Signed)
Patient returns after laparoscopic cholecystectomy. She is doing well. Her wounds are healing well her abdomen is soft and nontender. I advised her quit smoking and she's done so. Follow up as needed.

## 2011-12-10 NOTE — Patient Instructions (Signed)
Return as needed

## 2012-06-25 ENCOUNTER — Emergency Department (HOSPITAL_COMMUNITY): Payer: Medicaid Other

## 2012-06-25 ENCOUNTER — Encounter (HOSPITAL_COMMUNITY): Payer: Self-pay | Admitting: Family Medicine

## 2012-06-25 ENCOUNTER — Emergency Department (HOSPITAL_COMMUNITY)
Admission: EM | Admit: 2012-06-25 | Discharge: 2012-06-25 | Disposition: A | Discharge status: Home / Self Care | Payer: Medicaid Other | Attending: Emergency Medicine | Admitting: Emergency Medicine

## 2012-06-25 DIAGNOSIS — R112 Nausea with vomiting, unspecified: Secondary | ICD-10-CM | POA: Insufficient documentation

## 2012-06-25 DIAGNOSIS — M129 Arthropathy, unspecified: Secondary | ICD-10-CM | POA: Insufficient documentation

## 2012-06-25 DIAGNOSIS — Z8679 Personal history of other diseases of the circulatory system: Secondary | ICD-10-CM | POA: Insufficient documentation

## 2012-06-25 DIAGNOSIS — I10 Essential (primary) hypertension: Secondary | ICD-10-CM | POA: Insufficient documentation

## 2012-06-25 DIAGNOSIS — R197 Diarrhea, unspecified: Secondary | ICD-10-CM | POA: Insufficient documentation

## 2012-06-25 DIAGNOSIS — J4489 Other specified chronic obstructive pulmonary disease: Secondary | ICD-10-CM | POA: Insufficient documentation

## 2012-06-25 DIAGNOSIS — K219 Gastro-esophageal reflux disease without esophagitis: Secondary | ICD-10-CM | POA: Insufficient documentation

## 2012-06-25 DIAGNOSIS — F17200 Nicotine dependence, unspecified, uncomplicated: Secondary | ICD-10-CM | POA: Insufficient documentation

## 2012-06-25 DIAGNOSIS — F3289 Other specified depressive episodes: Secondary | ICD-10-CM | POA: Insufficient documentation

## 2012-06-25 DIAGNOSIS — Z872 Personal history of diseases of the skin and subcutaneous tissue: Secondary | ICD-10-CM | POA: Insufficient documentation

## 2012-06-25 DIAGNOSIS — Z8719 Personal history of other diseases of the digestive system: Secondary | ICD-10-CM | POA: Insufficient documentation

## 2012-06-25 DIAGNOSIS — F411 Generalized anxiety disorder: Secondary | ICD-10-CM | POA: Insufficient documentation

## 2012-06-25 DIAGNOSIS — R1084 Generalized abdominal pain: Secondary | ICD-10-CM

## 2012-06-25 DIAGNOSIS — E119 Type 2 diabetes mellitus without complications: Secondary | ICD-10-CM | POA: Insufficient documentation

## 2012-06-25 DIAGNOSIS — J449 Chronic obstructive pulmonary disease, unspecified: Secondary | ICD-10-CM | POA: Insufficient documentation

## 2012-06-25 DIAGNOSIS — F329 Major depressive disorder, single episode, unspecified: Secondary | ICD-10-CM | POA: Insufficient documentation

## 2012-06-25 HISTORY — DX: Type 2 diabetes mellitus without complications: E11.9

## 2012-06-25 LAB — CBC WITH DIFFERENTIAL/PLATELET
Basophils Relative: 0 % (ref 0–1)
Eosinophils Absolute: 0.3 10*3/uL (ref 0.0–0.7)
HCT: 47.6 % — ABNORMAL HIGH (ref 36.0–46.0)
Hemoglobin: 16 g/dL — ABNORMAL HIGH (ref 12.0–15.0)
Lymphs Abs: 1.2 10*3/uL (ref 0.7–4.0)
MCH: 27.8 pg (ref 26.0–34.0)
MCHC: 33.6 g/dL (ref 30.0–36.0)
Monocytes Absolute: 0.3 10*3/uL (ref 0.1–1.0)
Monocytes Relative: 2 % — ABNORMAL LOW (ref 3–12)
RBC: 5.75 MIL/uL — ABNORMAL HIGH (ref 3.87–5.11)

## 2012-06-25 LAB — URINALYSIS, ROUTINE W REFLEX MICROSCOPIC
Glucose, UA: 250 mg/dL — AB
Hgb urine dipstick: NEGATIVE
Ketones, ur: 15 mg/dL — AB
Specific Gravity, Urine: 1.04 — ABNORMAL HIGH (ref 1.005–1.030)
pH: 5.5 (ref 5.0–8.0)

## 2012-06-25 LAB — COMPREHENSIVE METABOLIC PANEL
Albumin: 3.9 g/dL (ref 3.5–5.2)
Alkaline Phosphatase: 189 U/L — ABNORMAL HIGH (ref 39–117)
BUN: 14 mg/dL (ref 6–23)
Chloride: 101 mEq/L (ref 96–112)
Creatinine, Ser: 0.5 mg/dL (ref 0.50–1.10)
GFR calc Af Amer: >90 mL/min (ref >90)
Glucose, Bld: 253 mg/dL — ABNORMAL HIGH (ref 70–99)
Potassium: 3.6 mEq/L (ref 3.5–5.1)
Total Bilirubin: 0.5 mg/dL (ref 0.3–1.2)
Total Protein: 8 g/dL (ref 6.0–8.3)

## 2012-06-25 LAB — LIPASE, BLOOD: Lipase: 53 U/L (ref 11–59)

## 2012-06-25 LAB — URINE MICROSCOPIC-ADD ON

## 2012-06-25 MED ORDER — SODIUM CHLORIDE 0.9 % IV BOLUS (SEPSIS)
1000.0000 mL | Freq: Once | INTRAVENOUS | Status: AC
  Administered 2012-06-25: 1000 mL via INTRAVENOUS

## 2012-06-25 MED ORDER — MORPHINE SULFATE 4 MG/ML IJ SOLN
4.0000 mg | Freq: Once | INTRAMUSCULAR | Status: AC
  Administered 2012-06-25: 4 mg via INTRAVENOUS
  Filled 2012-06-25: qty 1

## 2012-06-25 MED ORDER — IOHEXOL 300 MG/ML  SOLN
100.0000 mL | Freq: Once | INTRAMUSCULAR | Status: AC | PRN
  Administered 2012-06-25: 100 mL via INTRAVENOUS

## 2012-06-25 MED ORDER — PROMETHAZINE HCL 25 MG PO TABS: 12.5000 mg | ORAL_TABLET | Freq: Four times a day (QID) | ORAL | Status: DC | PRN

## 2012-06-25 MED ORDER — SODIUM CHLORIDE 0.9 % IV SOLN
Freq: Once | INTRAVENOUS | Status: AC
  Administered 2012-06-25: 20:00:00 via INTRAVENOUS

## 2012-06-25 MED ORDER — ONDANSETRON HCL 4 MG/2ML IJ SOLN
4.0000 mg | Freq: Once | INTRAMUSCULAR | Status: AC
  Administered 2012-06-25: 4 mg via INTRAVENOUS
  Filled 2012-06-25: qty 2

## 2012-06-25 MED ORDER — CIPROFLOXACIN HCL 500 MG PO TABS: 500.0000 mg | ORAL_TABLET | Freq: Two times a day (BID) | ORAL | Status: DC

## 2012-06-25 NOTE — ED Notes (Signed)
CT notified pt done with contrast. 

## 2012-06-25 NOTE — ED Notes (Addendum)
Pt c/o n/v/d abdominal pain and pain all over since 0600 pt was awakened from sleep. Pt states she took imodium pta but it did not help.

## 2012-06-25 NOTE — ED Provider Notes (Signed)
History     CSN: [id number]  Arrival date & time 06/25/12  1457   None     Chief Complaint  Patient presents with  . Diarrhea  . Abdominal Pain     HPI chief complaint abdominal pain. Onset 12 hours ago. Location abdomen. Abdominal pain resolved with vomiting and diarrhea. Quality dull. No radiation. Severity. Timing intermittent. For associated signs and symptoms to review systems. Regarding social history see nurse's notes. Positive family history of diabetes. I have reviewed the patient's past medical, past surgical, social history as well as medications and allergies.  Past Medical History  Diagnosis Date  . Hypertension   . Anxiety   . Gallstones   . GERD (gastroesophageal reflux disease)   . COPD (chronic obstructive pulmonary disease)   . Dysrhythmia     pt unsure of name of arrythmia - " my heart rate will drop all of a sudden" -no current treatment   . Headache     migraines  . Rash     arms  . Arthritis   . Depression   . Diabetes mellitus without complication     Past Surgical History  Procedure Date  . Ectopic pregnancy surgery many yrs ago  . Carpal tunnel release     bil  . Cholecystectomy 11/24/2011    Procedure: LAPAROSCOPIC CHOLECYSTECTOMY WITH INTRAOPERATIVE CHOLANGIOGRAM;  Surgeon: Clovis Pu. Cornett, MD;  Location: [facility];  Service: General;  Laterality: N/A;  Laparoscopic Cholecystectomy with Cholangiogram    Family History  Problem Relation Age of Onset  . Breast cancer Sister   . Lung cancer Father     History  Substance Use Topics  . Smoking status: Current Every Day Smoker -- 0.5 packs/day  . Smokeless tobacco: Not on file  . Alcohol Use: No    OB History    Grav Para Term Preterm Abortions TAB SAB Ect Mult Living                  Review of Systems  Constitutional: Negative for fever and chills.  HENT: Negative for trouble swallowing, neck pain and neck stiffness.   Eyes: Negative for pain, discharge and itching.  Respiratory:  Negative for cough, chest tightness and shortness of breath.   Cardiovascular: Negative for chest pain, palpitations and leg swelling.  Gastrointestinal: Positive for nausea, vomiting, abdominal pain and diarrhea. Negative for constipation and blood in stool.  Genitourinary: Negative for dysuria, urgency, frequency, hematuria, flank pain, decreased urine volume, vaginal bleeding, vaginal discharge, difficulty urinating, vaginal pain, menstrual problem and pelvic pain.  Musculoskeletal: Negative for back pain and joint swelling.  Skin: Negative for rash and wound.  Neurological: Negative for dizziness, tremors, seizures, syncope, facial asymmetry, speech difficulty, weakness, light-headedness, numbness and headaches.  Hematological: Negative for adenopathy. Does not bruise/bleed easily.  Psychiatric/Behavioral: Negative for confusion and decreased concentration.    Allergies  Clarithromycin; Sulfa antibiotics; Amoxicillin-pot clavulanate; Aspirin; Chlorthalidone; Dilaudid; Lasix; Lisinopril; and Latex  Home Medications   Current Outpatient Rx  Name  Route  Sig  Dispense  Refill  . ALBUTEROL SULFATE HFA 108 (90 BASE) MCG/ACT IN AERS   Inhalation   Inhale 2 puffs into the lungs every 4 (four) hours as needed. Wheezing          . ALPRAZOLAM 0.5 MG PO TABS   Oral   Take 0.5 mg by mouth at bedtime as needed. Anxiety          . DOXYCYCLINE HYCLATE 100 MG PO TABS  Oral   Take 100 mg by mouth 2 (two) times daily.         Marland Kitchen CONJ ESTROG-MEDROXYPROGEST ACE 0.45-1.5 MG PO TABS   Oral   Take 1 tablet by mouth daily.         Marland Kitchen FLUOXETINE HCL 40 MG PO CAPS   Oral   Take 40 mg by mouth daily with breakfast.          . HYDROXYZINE HCL 25 MG PO TABS   Oral   Take 25 mg by mouth 2 (two) times daily as needed. For itching         . IBUPROFEN 800 MG PO TABS   Oral   Take 800 mg by mouth every 8 (eight) hours as needed. For pain         . IMIPRAMINE HCL 25 MG PO TABS   Oral    Take 50 mg by mouth at bedtime.         Marland Kitchen LOPERAMIDE HCL 2 MG PO TABS   Oral   Take 2-4 mg by mouth 4 (four) times daily as needed. diarrhea         . LORATADINE 10 MG PO TABS   Oral   Take 10 mg by mouth daily.         Marland Kitchen OMEPRAZOLE 20 MG PO CPDR   Oral   Take 20 mg by mouth daily.         Marland Kitchen VALSARTAN 160 MG PO TABS   Oral   Take 160 mg by mouth daily with breakfast.            BP 115/81  Pulse 118  Temp 98.1 F (36.7 C)  Resp 18  SpO2 97%  Physical Exam  Constitutional: She is oriented to person, place, and time. She appears well-developed and well-nourished. No distress.  HENT:  Head: Normocephalic and atraumatic.  Eyes: Conjunctivae normal are normal. Right eye exhibits no discharge. Left eye exhibits no discharge. No scleral icterus.  Neck: Normal range of motion. Neck supple.  Cardiovascular: Normal rate, regular rhythm, normal heart sounds and intact distal pulses.   No murmur heard. Pulmonary/Chest: Effort normal and breath sounds normal. No respiratory distress.  Abdominal: Soft. Normal appearance and bowel sounds are normal. She exhibits no distension, no abdominal bruit, no pulsatile midline mass and no mass. There is no hepatosplenomegaly. There is generalized tenderness. There is no rigidity, no rebound, no guarding, no CVA tenderness, no tenderness at McBurney's point and negative Murphy's sign. No hernia.  Musculoskeletal: Normal range of motion. She exhibits no tenderness.  Neurological: She is alert and oriented to person, place, and time.  Skin: Skin is warm and dry. She is not diaphoretic.  Psychiatric: She has a normal mood and affect.    ED Course  Procedures (including critical care time)  Labs Reviewed  URINALYSIS, ROUTINE W REFLEX MICROSCOPIC - Abnormal; Notable for the following:    Color, Urine AMBER (*)  BIOCHEMICALS MAY BE AFFECTED BY COLOR   APPearance CLOUDY (*)     Specific Gravity, Urine 1.040 (*)     Glucose, UA 250 (*)       Bilirubin Urine SMALL (*)     Ketones, ur 15 (*)     Protein, ur 30 (*)     All other components within normal limits  CBC WITH DIFFERENTIAL - Abnormal; Notable for the following:    WBC 15.1 (*)     RBC 5.75 (*)  Hemoglobin 16.0 (*)     HCT 47.6 (*)     Neutrophils Relative 89 (*)     Neutro Abs 13.4 (*)     Lymphocytes Relative 8 (*)     Monocytes Relative 2 (*)     All other components within normal limits  COMPREHENSIVE METABOLIC PANEL - Abnormal; Notable for the following:    Glucose, Bld 253 (*)     AST 91 (*)     ALT 116 (*)     Alkaline Phosphatase 189 (*)     All other components within normal limits  URINE MICROSCOPIC-ADD ON - Abnormal; Notable for the following:    Squamous Epithelial / LPF FEW (*)     All other components within normal limits  LIPASE, BLOOD   Ct Abdomen Pelvis W Contrast  06/25/2012  *RADIOLOGY REPORT*  Clinical Data: Abdominal pain, vomiting.  CT ABDOMEN AND PELVIS WITH CONTRAST  Technique:  Multidetector CT imaging of the abdomen and pelvis was performed following the standard protocol during bolus administration of intravenous contrast.  Contrast: OMNIPAQUE IOHEXOL 300 MG/ML  SOLN  Comparison: Ultrasound 08/30/2011  Findings: Linear densities in the lung bases, scarring or atelectasis.  Heart is normal size.  No effusions.  Low density throughout the liver compatible with fatty infiltration.  Prior cholecystectomy.  Stomach, pancreas, adrenals and kidneys are unremarkable.  Small nonobstructing stone in the mid pole of the right kidney.  No hydronephrosis. Small low-density lesion within the anterior aspect of the spleen, measuring 16 mm. This likely represents a small cyst or hemangioma.  Small bowel is decompressed.  Appendix is visualized and appears normal proximally and distally.  The midportion of the appendix is slightly dilated at 9 mm.  Recommend clinical correlation to exclude very early appendicitis.  Colon is grossly unremarkable.   Uterus, adnexa urinary bladder unremarkable.  No free fluid, free air or adenopathy.  No acute bony abnormality.  IMPRESSION: Slight prominence of both the midportion of the appendix, 9 mm in diameter.  I see no inflammatory change around the appendix, but recommend clinical correlation to completely exclude early appendicitis.  Diffuse fatty infiltration of the liver.  Prior cholecystectomy.   Original Report Authenticated By: Charlett Nose, M.D.      1. Abdominal pain, acute, generalized   2. Nausea vomiting and diarrhea       MDM  Patient is a well-appearing 51 year old postmenopausal female presented with 12 hours of intermittent generalized abdominal pain resolved with nonbilious nonbloody emesis and diarrhea. Vital stable. Afebrile. No peritoneal findings on exam but diffusely tender. Labs largely unremarkable. Mild leukocytosis. CT scan comments on mild appendiceal dilation but no inflammation. Neurosurgery consulted who reviewed the images and agrees with my assessment the patient's presentation is clearly atypical for appendicitis and the patient has not felt of appendicitis clinically. He did recommend discharging the patient on Cipro with general surgery followup later this week. Patient instructed to follow up with her primary care physician tomorrow for repeat abdominal check. The patient should come back if her symptoms worsen or she develops fever. Patient verbalized understanding.        Consuello Masse, MD 06/26/12 [phone]

## 2012-06-25 NOTE — ED Notes (Signed)
Patient transported to CT 

## 2012-06-25 NOTE — ED Notes (Signed)
Back from CT scan

## 2012-06-25 NOTE — ED Notes (Signed)
Per pt sts abdominal pain, diarrhea, and vomiting since this am. sts vomiting green bile and watery diarrhea.

## 2012-06-27 NOTE — ED Provider Notes (Signed)
  I performed a history and physical examination of [name] and discussed her management with Dr. March Rummage.  I agree with the history, physical, assessment, and plan of care, with the following exceptions: None    Chasitee Zenker, Elvis Coil, MD 06/27/12 0002

## 2012-07-03 ENCOUNTER — Encounter (INDEPENDENT_AMBULATORY_CARE_PROVIDER_SITE_OTHER): Payer: Self-pay | Admitting: Surgery

## 2012-07-03 ENCOUNTER — Ambulatory Visit (INDEPENDENT_AMBULATORY_CARE_PROVIDER_SITE_OTHER): Payer: Medicaid Other | Admitting: Surgery

## 2012-07-03 VITALS — BP 136/80 | HR 95 | Temp 98.6°F | Resp 18 | Ht 67.5 in | Wt 171.4 lb

## 2012-07-03 DIAGNOSIS — R109 Unspecified abdominal pain: Secondary | ICD-10-CM

## 2012-07-03 NOTE — Progress Notes (Signed)
Subjective:     Patient ID: [name], female   DOB: 12/20/1960, 52 y.o.   MRN: [id number]  HPI patient returns to clinic today in follow up of abdominal pain, nausea and vomiting from last week. She seemed emergency room and a CT scan was done which showed mild dilation of her appendix. Her white count was 15,000. She was sent home on Cipro. Her abdominal pain resolved in the emergency room after nausea and vomiting. She has no significant pain except for some mild discomfort in her epigastrium. She denies any right lower quadrant pain, nausea or, vomiting, fever chills. Denies back pain. She states she had mild elevations in her liver function studies. She also require surgery on both eyes in the near future.   Review of Systems  Constitutional: Negative.   HENT: Negative.   Gastrointestinal: Negative for abdominal pain and abdominal distention.       Objective:   Physical Exam  Constitutional: She is oriented to person, place, and time. She appears well-developed and well-nourished.  HENT:  Head: Normocephalic and atraumatic.  Neck: Normal range of motion. Neck supple.  Abdominal: Soft. Bowel sounds are normal. She exhibits no distension. There is no tenderness.  Neurological: She is alert and oriented to person, place, and time.       Assessment:     Abdominal pain resolved. Probably secondary to viral gastroenteritis which is resolved. Okay to proceed with surgery.    Plan:     May need follow up with gastrology for elevated liver function studies but these are minimally elevated. The patient does not have an acute abdomen nor signs of appendicitis on examination today.

## 2012-07-03 NOTE — Patient Instructions (Addendum)
OK to  Have eye surgery.  No evidence of appendicitis.  Liver function slightly elevated.  Would need to check with gastroenterologist but elevation is minimal.

## 2012-07-17 ENCOUNTER — Encounter (INDEPENDENT_AMBULATORY_CARE_PROVIDER_SITE_OTHER): Payer: Self-pay

## 2012-08-21 ENCOUNTER — Other Ambulatory Visit: Payer: Self-pay | Admitting: Gastroenterology

## 2012-08-21 ENCOUNTER — Other Ambulatory Visit: Payer: Self-pay | Admitting: Family Medicine

## 2012-08-21 DIAGNOSIS — R1011 Right upper quadrant pain: Secondary | ICD-10-CM

## 2012-08-22 ENCOUNTER — Ambulatory Visit
Admission: RE | Admit: 2012-08-22 | Discharge: 2012-08-22 | Disposition: A | Discharge status: Home / Self Care | Payer: Medicaid Other | Source: Ambulatory Visit | Attending: Gastroenterology | Admitting: Gastroenterology

## 2012-08-22 DIAGNOSIS — R1011 Right upper quadrant pain: Secondary | ICD-10-CM

## 2012-09-05 DIAGNOSIS — H3552 Pigmentary retinal dystrophy: Secondary | ICD-10-CM | POA: Insufficient documentation

## 2012-09-05 DIAGNOSIS — H18519 Endothelial corneal dystrophy, unspecified eye: Secondary | ICD-10-CM | POA: Insufficient documentation

## 2012-09-08 ENCOUNTER — Ambulatory Visit: Payer: Medicaid Other

## 2012-09-25 ENCOUNTER — Ambulatory Visit
Admission: RE | Admit: 2012-09-25 | Discharge: 2012-09-25 | Disposition: A | Discharge status: Home / Self Care | Payer: Medicaid Other | Source: Ambulatory Visit | Attending: Family Medicine | Admitting: Family Medicine

## 2012-09-25 DIAGNOSIS — Z803 Family history of malignant neoplasm of breast: Secondary | ICD-10-CM

## 2012-09-25 DIAGNOSIS — Z1231 Encounter for screening mammogram for malignant neoplasm of breast: Secondary | ICD-10-CM

## 2012-10-04 ENCOUNTER — Other Ambulatory Visit: Payer: Self-pay | Admitting: Gastroenterology

## 2012-10-04 LAB — HM COLONOSCOPY

## 2012-10-05 ENCOUNTER — Emergency Department (HOSPITAL_COMMUNITY)
Admission: EM | Admit: 2012-10-05 | Discharge: 2012-10-05 | Disposition: A | Discharge status: Home / Self Care | Payer: Medicaid Other | Attending: Emergency Medicine | Admitting: Emergency Medicine

## 2012-10-05 DIAGNOSIS — Z872 Personal history of diseases of the skin and subcutaneous tissue: Secondary | ICD-10-CM | POA: Insufficient documentation

## 2012-10-05 DIAGNOSIS — Y9389 Activity, other specified: Secondary | ICD-10-CM | POA: Insufficient documentation

## 2012-10-05 DIAGNOSIS — Z8719 Personal history of other diseases of the digestive system: Secondary | ICD-10-CM | POA: Insufficient documentation

## 2012-10-05 DIAGNOSIS — S39012A Strain of muscle, fascia and tendon of lower back, initial encounter: Secondary | ICD-10-CM

## 2012-10-05 DIAGNOSIS — F17200 Nicotine dependence, unspecified, uncomplicated: Secondary | ICD-10-CM | POA: Insufficient documentation

## 2012-10-05 DIAGNOSIS — Z8739 Personal history of other diseases of the musculoskeletal system and connective tissue: Secondary | ICD-10-CM | POA: Insufficient documentation

## 2012-10-05 DIAGNOSIS — Z79899 Other long term (current) drug therapy: Secondary | ICD-10-CM | POA: Insufficient documentation

## 2012-10-05 DIAGNOSIS — Z860100 Personal history of colon polyps, unspecified: Secondary | ICD-10-CM | POA: Insufficient documentation

## 2012-10-05 DIAGNOSIS — I10 Essential (primary) hypertension: Secondary | ICD-10-CM | POA: Insufficient documentation

## 2012-10-05 DIAGNOSIS — Z8669 Personal history of other diseases of the nervous system and sense organs: Secondary | ICD-10-CM | POA: Insufficient documentation

## 2012-10-05 DIAGNOSIS — M25559 Pain in unspecified hip: Secondary | ICD-10-CM | POA: Insufficient documentation

## 2012-10-05 DIAGNOSIS — M5416 Radiculopathy, lumbar region: Secondary | ICD-10-CM

## 2012-10-05 DIAGNOSIS — Y929 Unspecified place or not applicable: Secondary | ICD-10-CM | POA: Insufficient documentation

## 2012-10-05 DIAGNOSIS — J449 Chronic obstructive pulmonary disease, unspecified: Secondary | ICD-10-CM | POA: Insufficient documentation

## 2012-10-05 DIAGNOSIS — S335XXA Sprain of ligaments of lumbar spine, initial encounter: Secondary | ICD-10-CM | POA: Insufficient documentation

## 2012-10-05 DIAGNOSIS — K219 Gastro-esophageal reflux disease without esophagitis: Secondary | ICD-10-CM | POA: Insufficient documentation

## 2012-10-05 DIAGNOSIS — F329 Major depressive disorder, single episode, unspecified: Secondary | ICD-10-CM | POA: Insufficient documentation

## 2012-10-05 DIAGNOSIS — E119 Type 2 diabetes mellitus without complications: Secondary | ICD-10-CM | POA: Insufficient documentation

## 2012-10-05 DIAGNOSIS — F3289 Other specified depressive episodes: Secondary | ICD-10-CM | POA: Insufficient documentation

## 2012-10-05 DIAGNOSIS — IMO0002 Reserved for concepts with insufficient information to code with codable children: Secondary | ICD-10-CM | POA: Insufficient documentation

## 2012-10-05 DIAGNOSIS — F411 Generalized anxiety disorder: Secondary | ICD-10-CM | POA: Insufficient documentation

## 2012-10-05 DIAGNOSIS — J4489 Other specified chronic obstructive pulmonary disease: Secondary | ICD-10-CM | POA: Insufficient documentation

## 2012-10-05 DIAGNOSIS — X58XXXA Exposure to other specified factors, initial encounter: Secondary | ICD-10-CM | POA: Insufficient documentation

## 2012-10-05 DIAGNOSIS — Z792 Long term (current) use of antibiotics: Secondary | ICD-10-CM | POA: Insufficient documentation

## 2012-10-05 DIAGNOSIS — Z8679 Personal history of other diseases of the circulatory system: Secondary | ICD-10-CM | POA: Insufficient documentation

## 2012-10-05 MED ORDER — HYDROCODONE-ACETAMINOPHEN 5-325 MG PO TABS
1.0000 | ORAL_TABLET | Freq: Four times a day (QID) | ORAL | Status: DC | PRN
Start: 2012-10-05 — End: 2012-12-30

## 2012-10-05 MED ORDER — METHOCARBAMOL 500 MG PO TABS: 500.0000 mg | ORAL_TABLET | Freq: Two times a day (BID) | ORAL | Status: DC

## 2012-10-05 NOTE — ED Provider Notes (Signed)
History  This chart was scribed for non-physician practitioner Roxy Horseman, [name] working with Gilda Crease, MD, by Candelaria Stagers, ED Scribe. This patient was seen in room WTR7/WTR7 and the patient's care was started at 11:22 PM   CSN: [id number]  Arrival date & time 10/05/12  2303   First MD Initiated Contact with Patient 10/05/12 2304      Chief Complaint  Patient presents with  . Hip Pain     The history is provided by the patient. No language interpreter was used.   [name] is a 52 y.o. female who presents to the Emergency Department complaining of left posterior back pain that radiates down the back of her left leg to her foot that started this morning.  Pt reports that yesterday she had a colonoscopy and was lying on her left side during the procedure.  She had no complications during the procedure.  Pt has taken Vicodin with mild brief relief.  Pt is ambulatory with pain.  Pt was advised not to take ibuprofen for two weeks due to removal of two polyps during the colonoscopy.       Past Medical History  Diagnosis Date  . Hypertension   . Anxiety   . Gallstones   . GERD (gastroesophageal reflux disease)   . COPD (chronic obstructive pulmonary disease)   . Dysrhythmia     pt unsure of name of arrythmia - " my heart rate will drop all of a sudden" -no current treatment   . Headache     migraines  . Rash     arms  . Arthritis   . Depression   . Diabetes mellitus without complication     Past Surgical History  Procedure Laterality Date  . Ectopic pregnancy surgery  many yrs ago  . Carpal tunnel release      bil  . Cholecystectomy  11/24/2011    Procedure: LAPAROSCOPIC CHOLECYSTECTOMY WITH INTRAOPERATIVE CHOLANGIOGRAM;  Surgeon: Clovis Pu. Cornett, MD;  Location: [facility];  Service: General;  Laterality: N/A;  Laparoscopic Cholecystectomy with Cholangiogram    Family History  Problem Relation Age of Onset  . Breast cancer Sister   . Lung cancer  Father     History  Substance Use Topics  . Smoking status: Current Every Day Smoker -- 0.50 packs/day  . Smokeless tobacco: Not on file  . Alcohol Use: No    OB History   Grav Para Term Preterm Abortions TAB SAB Ect Mult Living                  Review of Systems  Musculoskeletal: Positive for arthralgias (left posterior hip pain radiating down the left leg).  All other systems reviewed and are negative.    Allergies  Clarithromycin; Sulfa antibiotics; Amoxicillin-pot clavulanate; Aspirin; Chlorthalidone; Dilaudid; Lasix; Lisinopril; and Latex  Home Medications   Current Outpatient Rx  Name  Route  Sig  Dispense  Refill  . albuterol (PROVENTIL HFA;VENTOLIN HFA) 108 (90 BASE) MCG/ACT inhaler   Inhalation   Inhale 2 puffs into the lungs every 4 (four) hours as needed. Wheezing          . ALPRAZolam (XANAX) 0.5 MG tablet   Oral   Take 0.5 mg by mouth 2 (two) times daily as needed. Anxiety          . cetirizine (ZYRTEC) 10 MG tablet   Oral   Take 10 mg by mouth daily.         Marland Kitchen  ciprofloxacin (CIPRO) 500 MG tablet   Oral   Take 1 tablet (500 mg total) by mouth 2 (two) times daily.   14 tablet   0   . doxycycline (VIBRA-TABS) 100 MG tablet   Oral   Take 100 mg by mouth daily.          Marland Kitchen FLUoxetine (PROZAC) 40 MG capsule   Oral   Take 40 mg by mouth daily with breakfast.          . HYDROcodone-acetaminophen (VICODIN) 5-500 MG per tablet   Oral   Take 1 tablet by mouth every 6 (six) hours as needed. For pain         . imipramine (TOFRANIL) 25 MG tablet   Oral   Take 50 mg by mouth at bedtime.         Marland Kitchen loperamide (IMODIUM A-D) 2 MG tablet   Oral   Take 2-4 mg by mouth every other day.          . metFORMIN (GLUCOPHAGE) 500 MG tablet   Oral   Take 500 mg by mouth 2 (two) times daily with a meal.         . omeprazole (PRILOSEC) 20 MG capsule   Oral   Take 20 mg by mouth daily.         . promethazine (PHENERGAN) 25 MG tablet    Oral   Take 0.5 tablets (12.5 mg total) by mouth every 6 (six) hours as needed for nausea.   10 tablet   0   . sodium chloride (MURO 128) 5 % ophthalmic solution      1 drop as needed.         . valsartan (DIOVAN) 160 MG tablet   Oral   Take 160 mg by mouth daily with breakfast.            BP 161/96  Pulse 101  Temp(Src) 98.9 F (37.2 C) (Oral)  Resp 18  Ht 5\' 7"  (1.702 m)  Wt 162 lb (73.483 kg)  BMI 25.37 kg/m2  SpO2 98%  Physical Exam  Nursing note and vitals reviewed. Constitutional: She is oriented to person, place, and time. She appears well-developed and well-nourished. No distress.  HENT:  Head: Normocephalic and atraumatic.  Eyes: EOM are normal.  Neck: Neck supple. No tracheal deviation present.  Cardiovascular: Normal rate.   Pulmonary/Chest: Effort normal. No respiratory distress.  Musculoskeletal: Normal range of motion.  No spine tenderness, step off, or deformity.  Left lumbar para spinal tender to palpation.  Able to ambulate.    Neurological: She is alert and oriented to person, place, and time.  Skin: Skin is warm and dry.  Psychiatric: She has a normal mood and affect. Her behavior is normal.    ED Course  Procedures   DIAGNOSTIC STUDIES: Oxygen Saturation is 98% on room air, normal by my interpretation.    COORDINATION OF CARE:  11:28 PM Discussed course of care with pt which includes muscle relaxer and pain medication.  Pt understands and agrees.    Labs Reviewed - No data to display No results found.   1. Lumbar strain, initial encounter   2. Lumbar radiculopathy       MDM  Patient also lumbar sprain/strain. No bony tenderness. Patient is able to ambulate appropriately. No mechanism of injury. Will treat with Robaxin, and a few pain pills. Recommended ice, stretching, and orthopedic followup for worsening symptoms. Patient understands and agrees with the plan. She is stable  and ready for discharge.   I personally performed the  services described in this documentation, which was scribed in my presence. The recorded information has been reviewed and is accurate.         Roxy Horseman, [name] 10/05/12 2341

## 2012-10-05 NOTE — ED Notes (Signed)
Pt states she woke up this morning with L hip pain and L side lower back back that radiates down L leg. Pt denies trauma to area, but states that she had a colonoscopy yesterday and laid on her L side for the procedure. Pt states she took a Vicodin around 2000, but had no pain relief. Pt ambulatory to exam room with steady gait. Pt arrives with companion.

## 2012-10-06 NOTE — ED Provider Notes (Signed)
Medical screening examination/treatment/procedure(s) were performed by non-physician practitioner and as supervising physician I was immediately available for consultation/collaboration.  John-Adam Dimetri Armitage, M.D.      John-Adam Jalea Bronaugh, MD 10/06/12 0656 

## 2012-12-30 ENCOUNTER — Encounter (HOSPITAL_COMMUNITY): Payer: Self-pay | Admitting: Emergency Medicine

## 2012-12-30 ENCOUNTER — Emergency Department (HOSPITAL_COMMUNITY)
Admission: EM | Admit: 2012-12-30 | Discharge: 2012-12-31 | Disposition: A | Discharge status: Home / Self Care | Payer: Medicaid Other | Attending: Emergency Medicine | Admitting: Emergency Medicine

## 2012-12-30 ENCOUNTER — Emergency Department (HOSPITAL_COMMUNITY): Payer: Medicaid Other

## 2012-12-30 DIAGNOSIS — F411 Generalized anxiety disorder: Secondary | ICD-10-CM | POA: Insufficient documentation

## 2012-12-30 DIAGNOSIS — Z882 Allergy status to sulfonamides status: Secondary | ICD-10-CM | POA: Insufficient documentation

## 2012-12-30 DIAGNOSIS — K219 Gastro-esophageal reflux disease without esophagitis: Secondary | ICD-10-CM | POA: Insufficient documentation

## 2012-12-30 DIAGNOSIS — F329 Major depressive disorder, single episode, unspecified: Secondary | ICD-10-CM | POA: Insufficient documentation

## 2012-12-30 DIAGNOSIS — Z7982 Long term (current) use of aspirin: Secondary | ICD-10-CM | POA: Insufficient documentation

## 2012-12-30 DIAGNOSIS — M129 Arthropathy, unspecified: Secondary | ICD-10-CM | POA: Insufficient documentation

## 2012-12-30 DIAGNOSIS — Z888 Allergy status to other drugs, medicaments and biological substances status: Secondary | ICD-10-CM | POA: Insufficient documentation

## 2012-12-30 DIAGNOSIS — J449 Chronic obstructive pulmonary disease, unspecified: Secondary | ICD-10-CM | POA: Insufficient documentation

## 2012-12-30 DIAGNOSIS — F17200 Nicotine dependence, unspecified, uncomplicated: Secondary | ICD-10-CM | POA: Insufficient documentation

## 2012-12-30 DIAGNOSIS — Z79899 Other long term (current) drug therapy: Secondary | ICD-10-CM | POA: Insufficient documentation

## 2012-12-30 DIAGNOSIS — Z881 Allergy status to other antibiotic agents status: Secondary | ICD-10-CM | POA: Insufficient documentation

## 2012-12-30 DIAGNOSIS — F3289 Other specified depressive episodes: Secondary | ICD-10-CM | POA: Insufficient documentation

## 2012-12-30 DIAGNOSIS — J45901 Unspecified asthma with (acute) exacerbation: Secondary | ICD-10-CM | POA: Insufficient documentation

## 2012-12-30 DIAGNOSIS — J4489 Other specified chronic obstructive pulmonary disease: Secondary | ICD-10-CM | POA: Insufficient documentation

## 2012-12-30 DIAGNOSIS — Z8679 Personal history of other diseases of the circulatory system: Secondary | ICD-10-CM | POA: Insufficient documentation

## 2012-12-30 DIAGNOSIS — I10 Essential (primary) hypertension: Secondary | ICD-10-CM | POA: Insufficient documentation

## 2012-12-30 DIAGNOSIS — J40 Bronchitis, not specified as acute or chronic: Secondary | ICD-10-CM

## 2012-12-30 DIAGNOSIS — E119 Type 2 diabetes mellitus without complications: Secondary | ICD-10-CM | POA: Insufficient documentation

## 2012-12-30 DIAGNOSIS — Z91040 Latex allergy status: Secondary | ICD-10-CM | POA: Insufficient documentation

## 2012-12-30 DIAGNOSIS — I499 Cardiac arrhythmia, unspecified: Secondary | ICD-10-CM | POA: Insufficient documentation

## 2012-12-30 DIAGNOSIS — J309 Allergic rhinitis, unspecified: Secondary | ICD-10-CM | POA: Insufficient documentation

## 2012-12-30 DIAGNOSIS — Z885 Allergy status to narcotic agent status: Secondary | ICD-10-CM | POA: Insufficient documentation

## 2012-12-30 NOTE — ED Notes (Signed)
PT. REPORTS PERSISTENT DRY COUGH WITH NASAL CONGESTION ONSET LAST NIGHT , PT. STATED HISTOROF COPD ( HEAVY SMOKER ) , DENIES FEVER .

## 2012-12-31 MED ORDER — PREDNISONE 20 MG PO TABS: 40.0000 mg | ORAL_TABLET | Freq: Every day | ORAL | Status: DC

## 2012-12-31 MED ORDER — PREDNISONE 20 MG PO TABS
40.0000 mg | ORAL_TABLET | Freq: Once | ORAL | Status: AC
  Administered 2012-12-31: 40 mg via ORAL
  Filled 2012-12-31: qty 2

## 2012-12-31 MED ORDER — BENZONATATE 100 MG PO CAPS: 100.0000 mg | ORAL_CAPSULE | Freq: Three times a day (TID) | ORAL | Status: DC

## 2012-12-31 MED ORDER — BENZONATATE 100 MG PO CAPS
200.0000 mg | ORAL_CAPSULE | Freq: Once | ORAL | Status: AC
  Administered 2012-12-31: 200 mg via ORAL
  Filled 2012-12-31: qty 2

## 2012-12-31 NOTE — ED Provider Notes (Signed)
History    CSN: [id number] Arrival date & time 12/30/12  2259  First MD Initiated Contact with Patient 12/31/12 0006     Chief Complaint  Patient presents with  . Cough   (Consider location/radiation/quality/duration/timing/severity/associated sxs/prior Treatment) HPI Comments: 52 year old female with a history of asthma who presents with a cough which started yesterday. This has been gradual in onset, gradually worsening, persistent and not associated with a productive cough. She developed rhinorrhea on arrival this evening. She has been in the hospital visiting a family member who is an inpatient upstairs. She denies swelling in the legs, rashes, diarrhea or other complaints. She has used her albuterol inhaler with minimal improvement. She denies fevers or chills  Patient is a 52 y.o. female presenting with cough. The history is provided by the patient.  Cough  Past Medical History  Diagnosis Date  . Hypertension   . Anxiety   . Gallstones   . GERD (gastroesophageal reflux disease)   . COPD (chronic obstructive pulmonary disease)   . Dysrhythmia     pt unsure of name of arrythmia - " my heart rate will drop all of a sudden" -no current treatment   . Headache(784.0)     migraines  . Rash     arms  . Arthritis   . Depression   . Diabetes mellitus without complication    Past Surgical History  Procedure Laterality Date  . Ectopic pregnancy surgery  many yrs ago  . Carpal tunnel release      bil  . Cholecystectomy  11/24/2011    Procedure: LAPAROSCOPIC CHOLECYSTECTOMY WITH INTRAOPERATIVE CHOLANGIOGRAM;  Surgeon: Clovis Pu. Cornett, MD;  Location: [facility];  Service: General;  Laterality: N/A;  Laparoscopic Cholecystectomy with Cholangiogram   Family History  Problem Relation Age of Onset  . Breast cancer Sister   . Lung cancer Father    History  Substance Use Topics  . Smoking status: Current Every Day Smoker -- 0.50 packs/day  . Smokeless tobacco: Not on file  . Alcohol  Use: No   OB History   Grav Para Term Preterm Abortions TAB SAB Ect Mult Living                 Review of Systems  Respiratory: Positive for cough.   All other systems reviewed and are negative.    Allergies  Clarithromycin; Sulfa antibiotics; Amoxicillin-pot clavulanate; Aspirin; Chlorthalidone; Dilaudid; Lasix; Lisinopril; and Latex  Home Medications   Current Outpatient Rx  Name  Route  Sig  Dispense  Refill  . albuterol (PROVENTIL HFA;VENTOLIN HFA) 108 (90 BASE) MCG/ACT inhaler   Inhalation   Inhale 2 puffs into the lungs every 4 (four) hours as needed. Wheezing          . ALPRAZolam (XANAX) 0.5 MG tablet   Oral   Take 0.5 mg by mouth 2 (two) times daily as needed. Anxiety          . aspirin EC 81 MG tablet   Oral   Take 81 mg by mouth daily.         . cetirizine (ZYRTEC) 10 MG tablet   Oral   Take 10 mg by mouth daily.         . clidinium-chlordiazePOXIDE (LIBRAX) 2.5-5 MG per capsule   Oral   Take 1 capsule by mouth 2 (two) times daily with a meal.         . colesevelam (WELCHOL) 625 MG tablet   Oral   Take 625  mg by mouth 2 (two) times daily with a meal.         . FLUoxetine (PROZAC) 40 MG capsule   Oral   Take 40 mg by mouth daily with breakfast.          . HYDROcodone-acetaminophen (VICODIN) 5-500 MG per tablet   Oral   Take 1 tablet by mouth every 8 (eight) hours as needed. For pain         . imipramine (TOFRANIL) 25 MG tablet   Oral   Take 50 mg by mouth at bedtime.         Marland Kitchen loperamide (IMODIUM A-D) 2 MG tablet   Oral   Take 2-4 mg by mouth every other day.          . metFORMIN (GLUCOPHAGE) 500 MG tablet   Oral   Take 1,000 mg by mouth 2 (two) times daily with a meal.          . omeprazole (PRILOSEC) 20 MG capsule   Oral   Take 20 mg by mouth daily.         . sodium chloride (MURO 128) 5 % ophthalmic ointment   Both Eyes   Place 1 drop into both eyes at bedtime.         . valsartan (DIOVAN) 160 MG tablet    Oral   Take 160 mg by mouth daily with breakfast.          . benzonatate (TESSALON) 100 MG capsule   Oral   Take 1 capsule (100 mg total) by mouth every 8 (eight) hours.   21 capsule   0   . predniSONE (DELTASONE) 20 MG tablet   Oral   Take 2 tablets (40 mg total) by mouth daily.   10 tablet   0   . promethazine (PHENERGAN) 25 MG tablet   Oral   Take 0.5 tablets (12.5 mg total) by mouth every 6 (six) hours as needed for nausea.   10 tablet   0    BP 147/93  Pulse 98  Temp(Src) 98.8 F (37.1 C) (Oral)  Resp 14  SpO2 100% Physical Exam  Nursing note and vitals reviewed. Constitutional: She appears well-developed and well-nourished. No distress.  HENT:  Head: Normocephalic and atraumatic.  Mouth/Throat: Oropharynx is clear and moist. No oropharyngeal exudate.  Eyes: Conjunctivae and EOM are normal. Pupils are equal, round, and reactive to light. Right eye exhibits no discharge. Left eye exhibits no discharge. No scleral icterus.  Neck: Normal range of motion. Neck supple. No JVD present. No thyromegaly present.  Cardiovascular: Normal rate, regular rhythm, normal heart sounds and intact distal pulses.  Exam reveals no gallop and no friction rub.   No murmur heard. Pulmonary/Chest: Effort normal and breath sounds normal. No respiratory distress. She has no wheezes. She has no rales.  Abdominal: Soft. Bowel sounds are normal. She exhibits no distension and no mass. There is no tenderness.  Musculoskeletal: Normal range of motion. She exhibits no edema and no tenderness.  Lymphadenopathy:    She has no cervical adenopathy.  Neurological: She is alert. Coordination normal.  Skin: Skin is warm and dry. No rash noted. No erythema.  Psychiatric: She has a normal mood and affect. Her behavior is normal.    ED Course  Procedures (including critical care time) Labs Reviewed - No data to display Dg Chest 2 View  12/30/2012   *RADIOLOGY REPORT*  Clinical Data: Sore throat and  nonproductive cough for 1 day.  CHEST - 2 VIEW  Comparison: 11/10/2011  Findings: Central bronchial wall thickening and central interstitial changes suggesting chronic bronchitis. The heart size and pulmonary vascularity are normal. The lungs appear clear and expanded without focal air space disease or consolidation. No blunting of the costophrenic angles.  No pneumothorax.  Mediastinal contours appear intact. Surgical clips in the right upper quadrant. No significant changes since the previous study.  IMPRESSION: Chronic bronchitic changes.  No evidence of active consolidation.   Original Report Authenticated By: Burman Nieves, M.D.   1. Bronchitis     MDM  The patient speaks in full sentences, normal respiratory rate, no wheezing. Her x-ray shows no signs of pneumonia, she does not have a fever, she will be treated with bronchodilators, prednisone, Tessalon Perles. She'll be given these medications prior to discharge. She states that she has an albuterol MDI with which she can use over the next 24 hours.   Meds given in ED:  Medications  predniSONE (DELTASONE) tablet 40 mg (not administered)  benzonatate (TESSALON) capsule 200 mg (not administered)    New Prescriptions   BENZONATATE (TESSALON) 100 MG CAPSULE    Take 1 capsule (100 mg total) by mouth every 8 (eight) hours.   PREDNISONE (DELTASONE) 20 MG TABLET    Take 2 tablets (40 mg total) by mouth daily.      Vida Roller, MD 12/31/12 [phone]

## 2013-02-05 ENCOUNTER — Emergency Department (HOSPITAL_COMMUNITY)
Admission: EM | Admit: 2013-02-05 | Discharge: 2013-02-06 | Disposition: A | Discharge status: Home / Self Care | Payer: Medicaid Other | Attending: Emergency Medicine | Admitting: Emergency Medicine

## 2013-02-05 ENCOUNTER — Encounter (HOSPITAL_COMMUNITY): Payer: Self-pay | Admitting: Emergency Medicine

## 2013-02-05 ENCOUNTER — Emergency Department (HOSPITAL_COMMUNITY): Payer: Medicaid Other

## 2013-02-05 DIAGNOSIS — M5431 Sciatica, right side: Secondary | ICD-10-CM

## 2013-02-05 DIAGNOSIS — I10 Essential (primary) hypertension: Secondary | ICD-10-CM | POA: Insufficient documentation

## 2013-02-05 DIAGNOSIS — J449 Chronic obstructive pulmonary disease, unspecified: Secondary | ICD-10-CM | POA: Insufficient documentation

## 2013-02-05 DIAGNOSIS — M5430 Sciatica, unspecified side: Secondary | ICD-10-CM | POA: Insufficient documentation

## 2013-02-05 DIAGNOSIS — Z8719 Personal history of other diseases of the digestive system: Secondary | ICD-10-CM | POA: Insufficient documentation

## 2013-02-05 DIAGNOSIS — R209 Unspecified disturbances of skin sensation: Secondary | ICD-10-CM | POA: Insufficient documentation

## 2013-02-05 DIAGNOSIS — J4489 Other specified chronic obstructive pulmonary disease: Secondary | ICD-10-CM | POA: Insufficient documentation

## 2013-02-05 DIAGNOSIS — Z7982 Long term (current) use of aspirin: Secondary | ICD-10-CM | POA: Insufficient documentation

## 2013-02-05 DIAGNOSIS — Z79899 Other long term (current) drug therapy: Secondary | ICD-10-CM | POA: Insufficient documentation

## 2013-02-05 DIAGNOSIS — F17200 Nicotine dependence, unspecified, uncomplicated: Secondary | ICD-10-CM | POA: Insufficient documentation

## 2013-02-05 DIAGNOSIS — Z880 Allergy status to penicillin: Secondary | ICD-10-CM | POA: Insufficient documentation

## 2013-02-05 DIAGNOSIS — S79929A Unspecified injury of unspecified thigh, initial encounter: Secondary | ICD-10-CM | POA: Insufficient documentation

## 2013-02-05 DIAGNOSIS — Y929 Unspecified place or not applicable: Secondary | ICD-10-CM | POA: Insufficient documentation

## 2013-02-05 DIAGNOSIS — Z8679 Personal history of other diseases of the circulatory system: Secondary | ICD-10-CM | POA: Insufficient documentation

## 2013-02-05 DIAGNOSIS — S8263XA Displaced fracture of lateral malleolus of unspecified fibula, initial encounter for closed fracture: Secondary | ICD-10-CM | POA: Insufficient documentation

## 2013-02-05 DIAGNOSIS — S79919A Unspecified injury of unspecified hip, initial encounter: Secondary | ICD-10-CM | POA: Insufficient documentation

## 2013-02-05 DIAGNOSIS — S82401A Unspecified fracture of shaft of right fibula, initial encounter for closed fracture: Secondary | ICD-10-CM

## 2013-02-05 DIAGNOSIS — Z8659 Personal history of other mental and behavioral disorders: Secondary | ICD-10-CM | POA: Insufficient documentation

## 2013-02-05 DIAGNOSIS — W010XXA Fall on same level from slipping, tripping and stumbling without subsequent striking against object, initial encounter: Secondary | ICD-10-CM | POA: Insufficient documentation

## 2013-02-05 DIAGNOSIS — M129 Arthropathy, unspecified: Secondary | ICD-10-CM | POA: Insufficient documentation

## 2013-02-05 DIAGNOSIS — Z91040 Latex allergy status: Secondary | ICD-10-CM | POA: Insufficient documentation

## 2013-02-05 DIAGNOSIS — Y9389 Activity, other specified: Secondary | ICD-10-CM | POA: Insufficient documentation

## 2013-02-05 DIAGNOSIS — E119 Type 2 diabetes mellitus without complications: Secondary | ICD-10-CM | POA: Insufficient documentation

## 2013-02-05 MED ORDER — ONDANSETRON 4 MG PO TBDP
4.0000 mg | ORAL_TABLET | Freq: Once | ORAL | Status: AC
  Administered 2013-02-05: 4 mg via ORAL
  Filled 2013-02-05: qty 1

## 2013-02-05 MED ORDER — MORPHINE SULFATE 4 MG/ML IJ SOLN
8.0000 mg | Freq: Once | INTRAMUSCULAR | Status: AC
  Administered 2013-02-05: 8 mg via INTRAMUSCULAR
  Filled 2013-02-05: qty 2

## 2013-02-05 NOTE — ED Notes (Signed)
Patient reports that she was walking on the driveway and slipped and fell. The patient is having bilateral hip pain, right knee pain, and right ankle pain after faceplanting in the driveway. The patient is having trouble sitting in chair

## 2013-02-06 ENCOUNTER — Emergency Department (HOSPITAL_COMMUNITY): Payer: Medicaid Other

## 2013-02-06 MED ORDER — OXYCODONE-ACETAMINOPHEN 5-325 MG PO TABS
2.0000 | ORAL_TABLET | Freq: Once | ORAL | Status: AC
  Administered 2013-02-06: 2 via ORAL
  Filled 2013-02-06: qty 2

## 2013-02-06 MED ORDER — OXYCODONE-ACETAMINOPHEN 5-325 MG PO TABS: 2.0000 | ORAL_TABLET | ORAL | Status: DC | PRN

## 2013-02-06 NOTE — Progress Notes (Signed)
Orthopedic Tech Progress Note Patient Details:  [name] [birth year] [id number]  Ortho Devices Type of Ortho Device: Post (short leg) splint   Haskell Flirt 02/06/2013, 2:16 AM

## 2013-02-06 NOTE — ED Provider Notes (Signed)
CSN: [id number]     Arrival date & time 02/05/13  2241 History     First MD Initiated Contact with Patient 02/05/13 2315     Chief Complaint  Patient presents with  . Hip Pain  . Leg Pain  . Fall   (Consider location/radiation/quality/duration/timing/severity/associated sxs/prior Treatment) HPI Comments: Patient presents after sustaining a fall. She complains of constant throbbing pain to both of her hips and her right ankle. She states that she was carrying some stuff down a driveway and her foot got caught in a muddy area. She states that her right leg twisted up behind her and she fell over onto her left hip. She denies hitting her head. There is no loss of consciousness. She denies any neck or back pain. She has pain it starts behind her right hip and radiates down her leg. She states her toes feel numb. She denies any weakness in the leg although her pain limits her movement. She has constant throbbing pain to her right ankle. She is associated swelling to her right ankle. She denies any other injuries from the fall.  Patient is a 52 y.o. female presenting with hip pain, leg pain, and fall.  Hip Pain Pertinent negatives include no chest pain, no abdominal pain, no headaches and no shortness of breath.  Leg Pain Associated symptoms: no back pain, no fatigue and no fever   Fall Pertinent negatives include no chest pain, no abdominal pain, no headaches and no shortness of breath.    Past Medical History  Diagnosis Date  . Hypertension   . Anxiety   . Gallstones   . GERD (gastroesophageal reflux disease)   . COPD (chronic obstructive pulmonary disease)   . Dysrhythmia     pt unsure of name of arrythmia - " my heart rate will drop all of a sudden" -no current treatment   . Headache(784.0)     migraines  . Rash     arms  . Arthritis   . Depression   . Diabetes mellitus without complication    Past Surgical History  Procedure Laterality Date  . Ectopic pregnancy surgery  many  yrs ago  . Carpal tunnel release      bil  . Cholecystectomy  11/24/2011    Procedure: LAPAROSCOPIC CHOLECYSTECTOMY WITH INTRAOPERATIVE CHOLANGIOGRAM;  Surgeon: Clovis Pu. Cornett, MD;  Location: [facility];  Service: General;  Laterality: N/A;  Laparoscopic Cholecystectomy with Cholangiogram   Family History  Problem Relation Age of Onset  . Breast cancer Sister   . Lung cancer Father    History  Substance Use Topics  . Smoking status: Current Every Day Smoker -- 0.50 packs/day  . Smokeless tobacco: Not on file  . Alcohol Use: No   OB History   Grav Para Term Preterm Abortions TAB SAB Ect Mult Living                 Review of Systems  Constitutional: Negative for fever, chills, diaphoresis and fatigue.  HENT: Negative for congestion, rhinorrhea and sneezing.   Eyes: Negative.   Respiratory: Negative for cough, chest tightness and shortness of breath.   Cardiovascular: Negative for chest pain and leg swelling.  Gastrointestinal: Negative for nausea, vomiting, abdominal pain, diarrhea and blood in stool.  Genitourinary: Negative for frequency, hematuria, flank pain and difficulty urinating.  Musculoskeletal: Positive for arthralgias. Negative for back pain.  Skin: Negative for rash.  Neurological: Positive for numbness. Negative for dizziness, speech difficulty, weakness and headaches.  Allergies  Clarithromycin; Sulfa antibiotics; Amoxicillin-pot clavulanate; Aspirin; Chlorthalidone; Dilaudid; Lasix; Lisinopril; and Latex  Home Medications   Current Outpatient Rx  Name  Route  Sig  Dispense  Refill  . albuterol (PROVENTIL HFA;VENTOLIN HFA) 108 (90 BASE) MCG/ACT inhaler   Inhalation   Inhale 2 puffs into the lungs every 4 (four) hours as needed. Wheezing          . ALPRAZolam (XANAX) 0.5 MG tablet   Oral   Take 0.5 mg by mouth 2 (two) times daily as needed. Anxiety          . aspirin EC 81 MG tablet   Oral   Take 81 mg by mouth daily.         . cetirizine  (ZYRTEC) 10 MG tablet   Oral   Take 10 mg by mouth daily.         . clidinium-chlordiazePOXIDE (LIBRAX) 2.5-5 MG per capsule   Oral   Take 1 capsule by mouth 2 (two) times daily with a meal.         . colesevelam (WELCHOL) 625 MG tablet   Oral   Take 625 mg by mouth 2 (two) times daily with a meal.         . FLUoxetine (PROZAC) 40 MG capsule   Oral   Take 40 mg by mouth daily with breakfast.          . HYDROcodone-acetaminophen (NORCO/VICODIN) 5-325 MG per tablet   Oral   Take 1 tablet by mouth every 8 (eight) hours as needed for pain.         Marland Kitchen imipramine (TOFRANIL) 25 MG tablet   Oral   Take 50 mg by mouth at bedtime.         Marland Kitchen loperamide (IMODIUM A-D) 2 MG tablet   Oral   Take 2-4 mg by mouth every other day.          . metFORMIN (GLUCOPHAGE) 500 MG tablet   Oral   Take 1,000 mg by mouth 2 (two) times daily with a meal.          . sodium chloride (MURO 128) 5 % ophthalmic ointment   Both Eyes   Place 1 drop into both eyes at bedtime.         . valsartan (DIOVAN) 160 MG tablet   Oral   Take 160 mg by mouth daily with breakfast.          . oxyCODONE-acetaminophen (PERCOCET) 5-325 MG per tablet   Oral   Take 2 tablets by mouth every 4 (four) hours as needed for pain.   20 tablet   0    BP 132/70  Pulse 103  Temp(Src) 98.7 F (37.1 C) (Oral)  Resp 18  Wt 161 lb (73.029 kg)  BMI 25.21 kg/m2  SpO2 100% Physical Exam  Constitutional: She is oriented to person, place, and time. She appears well-developed and well-nourished.  HENT:  Head: Normocephalic and atraumatic.  Eyes: Pupils are equal, round, and reactive to light.  Neck: Normal range of motion. Neck supple.  There is no pain on palpation of the cervical thoracic or lumbosacral spine  Cardiovascular: Normal rate, regular rhythm and normal heart sounds.   Pulmonary/Chest: Effort normal and breath sounds normal. No respiratory distress. She has no wheezes. She has no rales. She  exhibits no tenderness.  Abdominal: Soft. Bowel sounds are normal. There is no tenderness. There is no rebound and no guarding.  Musculoskeletal: Normal range of  motion. She exhibits edema.  Patient has swelling along the lateral malleolus of the right ankle. There is pain in this area. There is no other bony tenderness to the foot. Her pulses are intact in the foot. She has some subjective decrease to light touch in all 5 toes. She is able to wiggle the toes and flex and extend the foot although it's limited due to to pain in her ankle. She does have pain on palpation along the posterior aspects of both hips with pain in range of motion of primarily the right hip.  Lymphadenopathy:    She has no cervical adenopathy.  Neurological: She is alert and oriented to person, place, and time.  Skin: Skin is warm and dry. No rash noted.  Psychiatric: She has a normal mood and affect.    ED Course   Procedures (including critical care time)  Labs Reviewed - No data to display Dg Lumbar Spine Complete  02/06/2013   *RADIOLOGY REPORT*  Clinical Data: History of fall complaining of low back pain.  LUMBAR SPINE - COMPLETE 4+ VIEW  Comparison: CT of the abdomen and pelvis 06/25/2012.  Findings: Five views of the lumbar spine demonstrate no definite acute displaced fractures or compression type fractures.  Alignment is anatomic.  No defects of the pars interarticularis are noted. Mild multilevel degenerative disc disease and facet arthropathy.  IMPRESSION: 1.  No acute radiographic abnormality of the lumbar spine.   Original Report Authenticated By: Trudie Reed, M.D.   Dg Hip Bilateral W/pelvis  02/06/2013   *RADIOLOGY REPORT*  Clinical Data: History of fall with bilateral hip and coccygeal pain.  BILATERAL HIP WITH PELVIS - 4+ VIEW  Comparison: No priors.  Findings: AP view of the pelvis and AP and lateral views of the hips bilaterally demonstrate no acute displaced fracture, subluxation, dislocation, joint  or soft tissue abnormality.  IMPRESSION: 1.  No acute radiographic abnormality of the bony pelvis or either hip.   Original Report Authenticated By: Trudie Reed, M.D.   Dg Ankle Complete Right  02/06/2013   *RADIOLOGY REPORT*  Clinical Data: History of fall complaining of right ankle pain.  RIGHT ANKLE - COMPLETE 3+ VIEW  Comparison: No priors.  Findings: There is an oblique fracture through the lateral malleolus with overlying soft tissue swelling. This is nondisplaced.  Ankle mortise appears preserved.  Distal tibia is intact.  IMPRESSION: 1.  Acute oblique nondisplaced fracture through the lateral malleolus.   Original Report Authenticated By: Trudie Reed, M.D.   1. Fibula fracture, right, closed, initial encounter   2. Sciatica, right     MDM  Patient with a fracture through the lateral malleolus. The ankle mortise appears intact. She was placed in a posterior splint. At this point given her hip pain which is mostly over the sciatic nerve area I feel that she will not be able to tolerate walking boot. She was given crutches here in the ED and a prescription for a walker. On reexam she did have some tenderness in the midline over lower lumbosacral spine. Given this I will go ahead and do x-rays of her lumbosacral spine. She was given an injection of morphine and subsequent Percocet for pain control.  There is no fracture of the lumbosacral spine. I feel that likely this represents sciatica. She also has some pain on her inner thigh which is likely a muscle strain. She does still have some numbness in her toes. There is no other numbness to the foot or the leg.  She has no weakness in the leg. She has no other symptoms of cauda equina. I feel this is likely due to the swelling of her ankle however identifies her that it is important to follow up with orthopedist for followup on her fibula fracture and they can also reassess improvement of the numbness. If the numbness persists she may need MRI  of her back.  Rolan Bucco, MD 02/06/13 [phone]

## 2013-02-06 NOTE — Discharge Instructions (Signed)
Fibular Fracture, Ankle, Adult, Undisplaced, Treated with Immobilization You have a break (fracture) of your fibula at the end of this bone which makes up part of your ankle. This is the bone in your lower leg located on the outside of the leg and it makes up the bump you feel on the outside of your ankle. These fractures are easily diagnosed with x-rays. TREATMENT  You have a simple fracture (this means it is in good position and the bones are not displaced) of the part of the fibula that is located at the ankle. This usually will heal without disability and can often be treated with only casting or splinting depending on the nature of the break.  HOME CARE INSTRUCTIONS   Apply ice to the injury for 15-20 minutes, 3-4 times per day while awake, for 2 days. Put the ice in a plastic bag and place a thin towel between the bag of ice and your leg. This helps keep swelling down.  Use crutches as directed. Resume walking without crutches as directed by your caregiver or when comfortable doing so.  Only take over-the-counter or prescription medicines for pain, discomfort, or fever as directed by your caregiver.  Keep appointments for follow up X-rays if these are required.  If you have a removable splint or boot, do not remove the boot unless directed by your caregiver.  Warning: Do not drive a car or operate a motor vehicle until your caregiver specifically tells you it is safe to do so. SEEK IMMEDIATE MEDICAL CARE IF:   Your cast gets damaged or breaks.  You have continued severe pain or more swelling than you did before the cast was put on, or the pain is not controlled with medications.  Your skin or nails below the injury turn blue or grey, or feel cold or numb.  There is a bad smell, or new stains and/or pus like (purulent ) drainage coming from under the cast.  You develop severe pain in ankle or foot. MAKE SURE YOU:   Understand these instructions.  Will watch your  condition.  Will get help right away if you are not doing well or get worse. Document Released: 03/06/2002 Document Revised: 09/06/2011 Document Reviewed: 01/19/2008 [facility] Patient Information 2014 [place], [place].  Lumbosacral Strain Lumbosacral strain is one of the most common causes of back pain. There are many causes of back pain. Most are not serious conditions. CAUSES  Your backbone (spinal column) is made up of 24 main vertebral bodies, the sacrum, and the coccyx. These are held together by muscles and tough, fibrous tissue (ligaments). Nerve roots pass through the openings between the vertebrae. A sudden move or injury to the back may cause injury to, or pressure on, these nerves. This may result in localized back pain or pain movement (radiation) into the buttocks, down the leg, and into the foot. Sharp, shooting pain from the buttock down the back of the leg (sciatica) is frequently associated with a ruptured (herniated) disk. Pain may be caused by muscle spasm alone. Your caregiver can often find the cause of your pain by the details of your symptoms and an exam. In some cases, you may need tests (such as X-rays). Your caregiver will work with you to decide if any tests are needed based on your specific exam. HOME CARE INSTRUCTIONS   Avoid an underactive lifestyle. Active exercise, as directed by your caregiver, is your greatest weapon against back pain.  Avoid hard physical activities (tennis, racquetball, waterskiing) if you  are not in proper physical condition for it. This may aggravate or create problems.  If you have a back problem, avoid sports requiring sudden body movements. Swimming and walking are generally safer activities.  Maintain good posture.  Avoid becoming overweight (obese).  Use bed rest for only the most extreme, sudden (acute) episode. Your caregiver will help you determine how much bed rest is necessary.  For acute conditions, you may put ice on the  injured area.  Put ice in a plastic bag.  Place a towel between your skin and the bag.  Leave the ice on for 15-20 minutes at a time, every 2 hours, or as needed.  After you are improved and more active, it may help to apply heat for 30 minutes before activities. See your caregiver if you are having pain that lasts longer than expected. Your caregiver can advise appropriate exercises or therapy if needed. With conditioning, most back problems can be avoided. SEEK IMMEDIATE MEDICAL CARE IF:   You have numbness, tingling, weakness, or problems with the use of your arms or legs.  You experience severe back pain not relieved with medicines.  There is a change in bowel or bladder control.  You have increasing pain in any area of the body, including your belly (abdomen).  You notice shortness of breath, dizziness, or feel faint.  You feel sick to your stomach (nauseous), are throwing up (vomiting), or become sweaty.  You notice discoloration of your toes or legs, or your feet get very cold.  Your back pain is getting worse.  You have a fever. MAKE SURE YOU:   Understand these instructions.  Will watch your condition.  Will get help right away if you are not doing well or get worse. Document Released: 03/24/2005 Document Revised: 09/06/2011 Document Reviewed: 09/13/2008 [facility] Patient Information 2014 [place], [place].

## 2013-04-08 ENCOUNTER — Encounter (HOSPITAL_COMMUNITY): Payer: Self-pay | Admitting: Emergency Medicine

## 2013-04-08 ENCOUNTER — Emergency Department (HOSPITAL_COMMUNITY)
Admission: EM | Admit: 2013-04-08 | Discharge: 2013-04-08 | Disposition: A | Discharge status: Home / Self Care | Payer: Medicaid Other | Attending: Emergency Medicine | Admitting: Emergency Medicine

## 2013-04-08 DIAGNOSIS — E119 Type 2 diabetes mellitus without complications: Secondary | ICD-10-CM | POA: Insufficient documentation

## 2013-04-08 DIAGNOSIS — R209 Unspecified disturbances of skin sensation: Secondary | ICD-10-CM | POA: Insufficient documentation

## 2013-04-08 DIAGNOSIS — Z9889 Other specified postprocedural states: Secondary | ICD-10-CM | POA: Insufficient documentation

## 2013-04-08 DIAGNOSIS — F3289 Other specified depressive episodes: Secondary | ICD-10-CM | POA: Insufficient documentation

## 2013-04-08 DIAGNOSIS — R350 Frequency of micturition: Secondary | ICD-10-CM | POA: Insufficient documentation

## 2013-04-08 DIAGNOSIS — M129 Arthropathy, unspecified: Secondary | ICD-10-CM | POA: Insufficient documentation

## 2013-04-08 DIAGNOSIS — Z880 Allergy status to penicillin: Secondary | ICD-10-CM | POA: Insufficient documentation

## 2013-04-08 DIAGNOSIS — Z91040 Latex allergy status: Secondary | ICD-10-CM | POA: Insufficient documentation

## 2013-04-08 DIAGNOSIS — J4489 Other specified chronic obstructive pulmonary disease: Secondary | ICD-10-CM | POA: Insufficient documentation

## 2013-04-08 DIAGNOSIS — J449 Chronic obstructive pulmonary disease, unspecified: Secondary | ICD-10-CM | POA: Insufficient documentation

## 2013-04-08 DIAGNOSIS — F17200 Nicotine dependence, unspecified, uncomplicated: Secondary | ICD-10-CM | POA: Insufficient documentation

## 2013-04-08 DIAGNOSIS — F411 Generalized anxiety disorder: Secondary | ICD-10-CM | POA: Insufficient documentation

## 2013-04-08 DIAGNOSIS — K219 Gastro-esophageal reflux disease without esophagitis: Secondary | ICD-10-CM | POA: Insufficient documentation

## 2013-04-08 DIAGNOSIS — F329 Major depressive disorder, single episode, unspecified: Secondary | ICD-10-CM | POA: Insufficient documentation

## 2013-04-08 DIAGNOSIS — Z7982 Long term (current) use of aspirin: Secondary | ICD-10-CM | POA: Insufficient documentation

## 2013-04-08 DIAGNOSIS — M5410 Radiculopathy, site unspecified: Secondary | ICD-10-CM

## 2013-04-08 DIAGNOSIS — IMO0002 Reserved for concepts with insufficient information to code with codable children: Secondary | ICD-10-CM | POA: Insufficient documentation

## 2013-04-08 DIAGNOSIS — Z79899 Other long term (current) drug therapy: Secondary | ICD-10-CM | POA: Insufficient documentation

## 2013-04-08 DIAGNOSIS — I10 Essential (primary) hypertension: Secondary | ICD-10-CM | POA: Insufficient documentation

## 2013-04-08 LAB — URINALYSIS, ROUTINE W REFLEX MICROSCOPIC
Glucose, UA: NEGATIVE mg/dL
Hgb urine dipstick: NEGATIVE
Leukocytes, UA: NEGATIVE
Protein, ur: NEGATIVE mg/dL
Specific Gravity, Urine: 1.026 (ref 1.005–1.030)
Urobilinogen, UA: 0.2 mg/dL (ref 0.0–1.0)

## 2013-04-08 MED ORDER — DIAZEPAM 5 MG PO TABS: 5.0000 mg | ORAL_TABLET | Freq: Two times a day (BID) | ORAL | Status: DC

## 2013-04-08 MED ORDER — DIAZEPAM 5 MG PO TABS
5.0000 mg | ORAL_TABLET | Freq: Once | ORAL | Status: AC
  Administered 2013-04-08: 5 mg via ORAL
  Filled 2013-04-08: qty 1

## 2013-04-08 MED ORDER — OXYCODONE-ACETAMINOPHEN 5-325 MG PO TABS: ORAL_TABLET | ORAL | Status: DC

## 2013-04-08 MED ORDER — HYDROCODONE-ACETAMINOPHEN 5-325 MG PO TABS
1.0000 | ORAL_TABLET | Freq: Once | ORAL | Status: AC
  Administered 2013-04-08: 1 via ORAL
  Filled 2013-04-08: qty 1

## 2013-04-08 MED ORDER — DEXAMETHASONE SODIUM PHOSPHATE 10 MG/ML IJ SOLN
10.0000 mg | Freq: Once | INTRAMUSCULAR | Status: AC
  Administered 2013-04-08: 10 mg via INTRAMUSCULAR
  Filled 2013-04-08: qty 1

## 2013-04-08 NOTE — ED Notes (Signed)
Pt alert and mentating appropriately upon d/c. Pt given d.c teaching, prescriptions, and follow up care instructions. Pt verbalizes understanding of d/c teaching and has no further questions upon d/c. NAD noted upon d/c. Pt ambulatory leaving ER. Pt instructed not to drive. Pt endorses that significant other at bedside will be driving her home.

## 2013-04-08 NOTE — ED Notes (Signed)
Rt. Lower back pain radiated up her rt. Mid back and radiates down her rt. Hip and rt. Leg.   She recently got out of a rt. Boot due to a injury to her rt. Ankle.  She denies any injuries presently.  She did fall in August when she crushed her rt. Ankle.   Also having numbness and tingling.  Denies any pain while voiding or moving her bowels.

## 2013-04-08 NOTE — ED Provider Notes (Signed)
Medical screening examination/treatment/procedure(s) were performed by non-physician practitioner and as supervising physician I was immediately available for consultation/collaboration.   Madelon Welsch B. Bernette Mayers, MD 04/08/13 [phone]

## 2013-04-08 NOTE — ED Provider Notes (Signed)
CSN: [id number]     Arrival date & time 04/08/13  0803 History   First MD Initiated Contact with Patient 04/08/13 [phone]     Chief Complaint  Patient presents with  . Back Pain   (Consider location/radiation/quality/duration/timing/severity/associated sxs/prior Treatment) HPI  [name] is a 52 y.o. female complaining of low back pain with paresthesia radiating down the right leg worsening over 3 days. Pain is severe 8/10 exacerbated by standing. Not relieved with Vicodin. Pt had ORIF for right ankle trauma several weeks ago and recently d/c'd the boot.  Pt denies fever, numbness, weakness, change in bowel or bladder habits, h/o IVDU or cancer. Pt reports increased urinary frequency, denies dysuria or flank pain.   Past Medical History  Diagnosis Date  . Hypertension   . Anxiety   . Gallstones   . GERD (gastroesophageal reflux disease)   . COPD (chronic obstructive pulmonary disease)   . Dysrhythmia     pt unsure of name of arrythmia - " my heart rate will drop all of a sudden" -no current treatment   . Headache(784.0)     migraines  . Rash     arms  . Arthritis   . Depression   . Diabetes mellitus without complication    Past Surgical History  Procedure Laterality Date  . Ectopic pregnancy surgery  many yrs ago  . Carpal tunnel release      bil  . Cholecystectomy  11/24/2011    Procedure: LAPAROSCOPIC CHOLECYSTECTOMY WITH INTRAOPERATIVE CHOLANGIOGRAM;  Surgeon: Clovis Pu. Cornett, MD;  Location: [facility];  Service: General;  Laterality: N/A;  Laparoscopic Cholecystectomy with Cholangiogram   Family History  Problem Relation Age of Onset  . Breast cancer Sister   . Lung cancer Father    History  Substance Use Topics  . Smoking status: Current Every Day Smoker -- 0.50 packs/day  . Smokeless tobacco: Not on file  . Alcohol Use: No   OB History   Grav Para Term Preterm Abortions TAB SAB Ect Mult Living                 Review of Systems 10 systems reviewed and found  to be negative, except as noted in the HPI  Allergies  Clarithromycin; Sulfa antibiotics; Amoxicillin-pot clavulanate; Aspirin; Biaxin; Chlorthalidone; Dilaudid; Lasix; Lisinopril; and Latex  Home Medications   Current Outpatient Rx  Name  Route  Sig  Dispense  Refill  . albuterol (PROVENTIL HFA;VENTOLIN HFA) 108 (90 BASE) MCG/ACT inhaler   Inhalation   Inhale 2 puffs into the lungs every 4 (four) hours as needed. Wheezing         . ALPRAZolam (XANAX) 0.5 MG tablet   Oral   Take 0.5 mg by mouth 3 (three) times daily as needed for sleep or anxiety. Anxiety          . aspirin EC 81 MG tablet   Oral   Take 81 mg by mouth daily.         . cetirizine (ZYRTEC) 10 MG tablet   Oral   Take 10 mg by mouth daily.         . clidinium-chlordiazePOXIDE (LIBRAX) 2.5-5 MG per capsule   Oral   Take 1 capsule by mouth 2 (two) times daily with a meal.         . colesevelam (WELCHOL) 625 MG tablet   Oral   Take 625 mg by mouth 2 (two) times daily with a meal.         .  FLUoxetine (PROZAC) 40 MG capsule   Oral   Take 40 mg by mouth daily with breakfast.          . HYDROcodone-acetaminophen (NORCO/VICODIN) 5-325 MG per tablet   Oral   Take 1 tablet by mouth every 8 (eight) hours as needed for pain.         Marland Kitchen imipramine (TOFRANIL) 25 MG tablet   Oral   Take 50 mg by mouth at bedtime.         Marland Kitchen loperamide (IMODIUM A-D) 2 MG tablet   Oral   Take 2 mg by mouth 2 (two) times daily.          . metFORMIN (GLUCOPHAGE) 500 MG tablet   Oral   Take 1,000 mg by mouth 2 (two) times daily with a meal.          . omeprazole (PRILOSEC) 20 MG capsule   Oral   Take 20 mg by mouth daily.         . sodium chloride (MURO 128) 5 % ophthalmic ointment   Both Eyes   Place 1 drop into both eyes at bedtime.         . valsartan (DIOVAN) 160 MG tablet   Oral   Take 160 mg by mouth daily with breakfast.           BP 119/78  Pulse 74  Temp(Src) 97.7 F (36.5 C) (Oral)   Resp 18  Ht 5\' 8"  (1.727 m)  Wt 161 lb (73.029 kg)  BMI 24.49 kg/m2  SpO2 99% Physical Exam  Nursing note and vitals reviewed. Constitutional: She is oriented to person, place, and time. She appears well-developed and well-nourished. No distress.  HENT:  Head: Normocephalic.  Eyes: Conjunctivae and EOM are normal.  Cardiovascular: Normal rate.   Pulmonary/Chest: Effort normal and breath sounds normal. No stridor.  Abdominal: Soft. Bowel sounds are normal.  Musculoskeletal: Normal range of motion. She exhibits no edema.   + Bilateral lumbar  TTP and paraspinal spasm.   No point tenderness to percussion of lumbar spinal processes.   Strength is 5 out of 5 to bilateral lower extremities at hip and knee;extensor hallucis longus 5 out of 5. Ankle strength 5 out of 5, no clonus, neurovascularly intact. Strait leg raise is negative bilaterally. No saddle anaesthesia      Neurological: She is alert and oriented to person, place, and time.  Skin:  Well healing surgical scar to right ankle lateral malleolus. Neurovascularly intact  Psychiatric: She has a normal mood and affect.    ED Course  Procedures (including critical care time) Labs Review Labs Reviewed  URINALYSIS, ROUTINE W REFLEX MICROSCOPIC   Imaging Review No results found.  EKG Interpretation   None       MDM   1. Radicular low back pain   2. Post-operative state      Filed Vitals:   04/08/13 0808  BP: 119/78  Pulse: 74  Temp: 97.7 F (36.5 C)  TempSrc: Oral  Resp: 18  Height: 5\' 8"  (1.727 m)  Weight: 161 lb (73.029 kg)  SpO2: 99%     [name] is a 52 y.o. female with right sided radicular back pain, no red-flags. Likely secondary to change in mechanics s/p right ankle ORIF. UA clean, advised pt to f/u with orthopedist Dr. Ophelia Charter  Medications  diazepam (VALIUM) tablet 5 mg (5 mg Oral Given 04/08/13 0917)  HYDROcodone-acetaminophen (NORCO/VICODIN) 5-325 MG per tablet 1 tablet (1 tablet Oral  Given  04/08/13 0917)  dexamethasone (DECADRON) injection 10 mg (10 mg Intramuscular Given 04/08/13 0957)    Pt is hemodynamically stable, appropriate for, and amenable to discharge at this time. Pt verbalized understanding and agrees with care plan. All questions answered. Outpatient follow-up and specific return precautions discussed.    New Prescriptions   DIAZEPAM (VALIUM) 5 MG TABLET    Take 1 tablet (5 mg total) by mouth 2 (two) times daily.   OXYCODONE-ACETAMINOPHEN (PERCOCET/ROXICET) 5-325 MG PER TABLET    1 to 2 tabs PO q6hrs  PRN for pain    Note: Portions of this report may have been transcribed using voice recognition software. Every effort was made to ensure accuracy; however, inadvertent computerized transcription errors may be present      Wynetta Emery, [name] 04/08/13 [phone]

## 2013-04-25 ENCOUNTER — Encounter (HOSPITAL_COMMUNITY): Payer: Self-pay | Admitting: Emergency Medicine

## 2013-04-25 DIAGNOSIS — M129 Arthropathy, unspecified: Secondary | ICD-10-CM | POA: Insufficient documentation

## 2013-04-25 DIAGNOSIS — R1084 Generalized abdominal pain: Secondary | ICD-10-CM | POA: Insufficient documentation

## 2013-04-25 DIAGNOSIS — F3289 Other specified depressive episodes: Secondary | ICD-10-CM | POA: Insufficient documentation

## 2013-04-25 DIAGNOSIS — E119 Type 2 diabetes mellitus without complications: Secondary | ICD-10-CM | POA: Insufficient documentation

## 2013-04-25 DIAGNOSIS — R197 Diarrhea, unspecified: Secondary | ICD-10-CM | POA: Insufficient documentation

## 2013-04-25 DIAGNOSIS — R112 Nausea with vomiting, unspecified: Secondary | ICD-10-CM | POA: Insufficient documentation

## 2013-04-25 DIAGNOSIS — J4489 Other specified chronic obstructive pulmonary disease: Secondary | ICD-10-CM | POA: Insufficient documentation

## 2013-04-25 DIAGNOSIS — Z79899 Other long term (current) drug therapy: Secondary | ICD-10-CM | POA: Insufficient documentation

## 2013-04-25 DIAGNOSIS — F411 Generalized anxiety disorder: Secondary | ICD-10-CM | POA: Insufficient documentation

## 2013-04-25 DIAGNOSIS — J449 Chronic obstructive pulmonary disease, unspecified: Secondary | ICD-10-CM | POA: Insufficient documentation

## 2013-04-25 DIAGNOSIS — Z7982 Long term (current) use of aspirin: Secondary | ICD-10-CM | POA: Insufficient documentation

## 2013-04-25 DIAGNOSIS — F329 Major depressive disorder, single episode, unspecified: Secondary | ICD-10-CM | POA: Insufficient documentation

## 2013-04-25 DIAGNOSIS — F17200 Nicotine dependence, unspecified, uncomplicated: Secondary | ICD-10-CM | POA: Insufficient documentation

## 2013-04-25 DIAGNOSIS — Z91040 Latex allergy status: Secondary | ICD-10-CM | POA: Insufficient documentation

## 2013-04-25 DIAGNOSIS — K219 Gastro-esophageal reflux disease without esophagitis: Secondary | ICD-10-CM | POA: Insufficient documentation

## 2013-04-25 DIAGNOSIS — I10 Essential (primary) hypertension: Secondary | ICD-10-CM | POA: Insufficient documentation

## 2013-04-25 LAB — CBC WITH DIFFERENTIAL/PLATELET
Eosinophils Absolute: 0.5 10*3/uL (ref 0.0–0.7)
Eosinophils Relative: 3 % (ref 0–5)
Hemoglobin: 14.7 g/dL (ref 12.0–15.0)
Lymphs Abs: 1.5 10*3/uL (ref 0.7–4.0)
MCH: 27.7 pg (ref 26.0–34.0)
MCV: 82.9 fL (ref 78.0–100.0)
Monocytes Absolute: 0.6 10*3/uL (ref 0.1–1.0)
Monocytes Relative: 4 % (ref 3–12)
Platelets: 228 10*3/uL (ref 150–400)
RBC: 5.31 MIL/uL — ABNORMAL HIGH (ref 3.87–5.11)

## 2013-04-25 NOTE — ED Notes (Signed)
Pt. reports emesis and diarrhea onset this evening with mid abdominal cramping when vomitting .

## 2013-04-26 ENCOUNTER — Emergency Department (HOSPITAL_COMMUNITY)
Admission: EM | Admit: 2013-04-26 | Discharge: 2013-04-26 | Disposition: A | Discharge status: Home / Self Care | Payer: Medicaid Other | Attending: Emergency Medicine | Admitting: Emergency Medicine

## 2013-04-26 DIAGNOSIS — R112 Nausea with vomiting, unspecified: Secondary | ICD-10-CM

## 2013-04-26 LAB — URINALYSIS, ROUTINE W REFLEX MICROSCOPIC
Glucose, UA: NEGATIVE mg/dL
Urobilinogen, UA: 0.2 mg/dL (ref 0.0–1.0)
pH: 5 (ref 5.0–8.0)

## 2013-04-26 LAB — COMPREHENSIVE METABOLIC PANEL
BUN: 9 mg/dL (ref 6–23)
Calcium: 9.4 mg/dL (ref 8.4–10.5)
GFR calc Af Amer: >90 mL/min (ref >90)
Glucose, Bld: 183 mg/dL — ABNORMAL HIGH (ref 70–99)
Total Protein: 7.5 g/dL (ref 6.0–8.3)

## 2013-04-26 MED ORDER — MORPHINE SULFATE 4 MG/ML IJ SOLN
4.0000 mg | Freq: Once | INTRAMUSCULAR | Status: AC
  Administered 2013-04-26: 4 mg via INTRAVENOUS
  Filled 2013-04-26: qty 1

## 2013-04-26 MED ORDER — SODIUM CHLORIDE 0.9 % IV BOLUS (SEPSIS)
1000.0000 mL | Freq: Once | INTRAVENOUS | Status: AC
  Administered 2013-04-26: 1000 mL via INTRAVENOUS

## 2013-04-26 MED ORDER — ONDANSETRON HCL 4 MG PO TABS: 4.0000 mg | ORAL_TABLET | Freq: Four times a day (QID) | ORAL | Status: DC

## 2013-04-26 MED ORDER — ONDANSETRON HCL 4 MG/2ML IJ SOLN
4.0000 mg | Freq: Once | INTRAMUSCULAR | Status: AC
  Administered 2013-04-26: 4 mg via INTRAVENOUS
  Filled 2013-04-26: qty 2

## 2013-04-26 NOTE — ED Provider Notes (Signed)
CSN: [id number]     Arrival date & time 04/25/13  2314 History   First MD Initiated Contact with Patient 04/26/13 0246     Chief Complaint  Patient presents with  . Emesis  . Diarrhea   (Consider location/radiation/quality/duration/timing/severity/associated sxs/prior Treatment) HPI Comments: Patient states, that approximately 3 hours ago.  She started having nausea, vomiting, and diarrhea.  Both of her daughters, have been with the same illness one, requiring hospitalization. She has been sitting, with one daughter in the hospital, and sent from her room to to her illness. Has not taken any medication.  Prior to arriving in the emergency department for her symptoms  Patient is a 52 y.o. female presenting with vomiting and diarrhea. The history is provided by the patient.  Emesis Severity:  Moderate Duration:  3 hours Timing:  Intermittent Quality:  Bilious material Progression:  Worsening Chronicity:  New Recent urination:  Normal Relieved by:  None tried Worsened by:  Nothing tried Ineffective treatments:  None tried Associated symptoms: abdominal pain and diarrhea   Associated symptoms: no chills, no cough, no fever and no headaches   Risk factors: sick contacts   Diarrhea Associated symptoms: abdominal pain and vomiting   Associated symptoms: no chills, no recent cough, no fever and no headaches     Past Medical History  Diagnosis Date  . Hypertension   . Anxiety   . Gallstones   . GERD (gastroesophageal reflux disease)   . COPD (chronic obstructive pulmonary disease)   . Dysrhythmia     pt unsure of name of arrythmia - " my heart rate will drop all of a sudden" -no current treatment   . Headache(784.0)     migraines  . Rash     arms  . Arthritis   . Depression   . Diabetes mellitus without complication    Past Surgical History  Procedure Laterality Date  . Ectopic pregnancy surgery  many yrs ago  . Carpal tunnel release      bil  . Cholecystectomy   11/24/2011    Procedure: LAPAROSCOPIC CHOLECYSTECTOMY WITH INTRAOPERATIVE CHOLANGIOGRAM;  Surgeon: Clovis Pu. Cornett, MD;  Location: [facility];  Service: General;  Laterality: N/A;  Laparoscopic Cholecystectomy with Cholangiogram   Family History  Problem Relation Age of Onset  . Breast cancer Sister   . Lung cancer Father    History  Substance Use Topics  . Smoking status: Current Every Day Smoker -- 0.50 packs/day  . Smokeless tobacco: Not on file  . Alcohol Use: No   OB History   Grav Para Term Preterm Abortions TAB SAB Ect Mult Living                 Review of Systems  Constitutional: Negative for fever and chills.  Respiratory: Negative for shortness of breath.   Gastrointestinal: Positive for nausea, vomiting, abdominal pain and diarrhea.  Genitourinary: Negative for dysuria.  Musculoskeletal: Negative for back pain.  Skin: Negative for rash and wound.  Neurological: Negative for dizziness and headaches.  All other systems reviewed and are negative.    Allergies  Clarithromycin; Sulfa antibiotics; Amoxicillin-pot clavulanate; Aspirin; Biaxin; Chlorthalidone; Dilaudid; Lasix; Lisinopril; and Latex  Home Medications   Current Outpatient Rx  Name  Route  Sig  Dispense  Refill  . albuterol (PROVENTIL HFA;VENTOLIN HFA) 108 (90 BASE) MCG/ACT inhaler   Inhalation   Inhale 2 puffs into the lungs every 4 (four) hours as needed. Wheezing         .  ALPRAZolam (XANAX) 0.5 MG tablet   Oral   Take 0.5 mg by mouth 3 (three) times daily as needed for sleep or anxiety. Anxiety          . aspirin EC 81 MG tablet   Oral   Take 81 mg by mouth daily.         . cetirizine (ZYRTEC) 10 MG tablet   Oral   Take 10 mg by mouth daily.         . clidinium-chlordiazePOXIDE (LIBRAX) 2.5-5 MG per capsule   Oral   Take 1 capsule by mouth 2 (two) times daily with a meal.         . colesevelam (WELCHOL) 625 MG tablet   Oral   Take 625 mg by mouth 2 (two) times daily with a  meal.         . FLUoxetine (PROZAC) 40 MG capsule   Oral   Take 40 mg by mouth daily with breakfast.          . HYDROcodone-acetaminophen (NORCO/VICODIN) 5-325 MG per tablet   Oral   Take 1 tablet by mouth every 8 (eight) hours as needed for pain.         Marland Kitchen imipramine (TOFRANIL) 25 MG tablet   Oral   Take 50 mg by mouth at bedtime.         Marland Kitchen loperamide (IMODIUM A-D) 2 MG tablet   Oral   Take 2 mg by mouth 2 (two) times daily.          . metFORMIN (GLUCOPHAGE) 500 MG tablet   Oral   Take 1,000 mg by mouth 2 (two) times daily with a meal.          . omeprazole (PRILOSEC) 20 MG capsule   Oral   Take 20 mg by mouth 2 (two) times daily.          . sodium chloride (MURO 128) 5 % ophthalmic ointment   Both Eyes   Place 1 drop into both eyes at bedtime.         . valsartan (DIOVAN) 160 MG tablet   Oral   Take 160 mg by mouth daily with breakfast.          . diazepam (VALIUM) 5 MG tablet   Oral   Take 1 tablet (5 mg total) by mouth 2 (two) times daily.   10 tablet   0   . ondansetron (ZOFRAN) 4 MG tablet   Oral   Take 1 tablet (4 mg total) by mouth every 6 (six) hours.   12 tablet   0   . oxyCODONE-acetaminophen (PERCOCET/ROXICET) 5-325 MG per tablet      1 to 2 tabs PO q6hrs  PRN for pain   8 tablet   0    BP 96/61  Pulse 120  Temp(Src) 98.3 F (36.8 C) (Oral)  Resp 18  Wt 168 lb 1.6 oz (76.25 kg)  BMI 25.57 kg/m2  SpO2 96% Physical Exam  Nursing note and vitals reviewed. Constitutional: She is oriented to person, place, and time. She appears well-developed and well-nourished.  HENT:  Head: Normocephalic.  Eyes: Pupils are equal, round, and reactive to light.  Neck: Normal range of motion.  Cardiovascular: Normal rate and regular rhythm.   Pulmonary/Chest: Effort normal and breath sounds normal.  Abdominal: Soft. She exhibits no distension. There is generalized tenderness.  Musculoskeletal: Normal range of motion.  Neurological: She is  alert and oriented to person, place, and  time.  Skin: Skin is warm. No rash noted.    ED Course  Procedures (including critical care time) Labs Review Labs Reviewed  URINALYSIS, ROUTINE W REFLEX MICROSCOPIC - Abnormal; Notable for the following:    Color, Urine AMBER (*)    APPearance CLOUDY (*)    Bilirubin Urine SMALL (*)    Ketones, ur 15 (*)    All other components within normal limits  CBC WITH DIFFERENTIAL - Abnormal; Notable for the following:    WBC 16.4 (*)    RBC 5.31 (*)    Neutrophils Relative % 84 (*)    Neutro Abs 13.8 (*)    Lymphocytes Relative 9 (*)    All other components within normal limits  COMPREHENSIVE METABOLIC PANEL - Abnormal; Notable for the following:    Glucose, Bld 183 (*)    Creatinine, Ser 0.48 (*)    ALT 40 (*)    Alkaline Phosphatase 196 (*)    All other components within normal limits   Imaging Review No results found.  EKG Interpretation   None       MDM   1. Nausea vomiting and diarrhea     Patient states she is feeling much better.  She is tolerating by mouth his she'll be discharged home with instructions for a Bratt diet, and a prescription for Zofran   Arman Filter, NP 04/26/13 0549  Arman Filter, NP 04/26/13 [phone]

## 2013-04-26 NOTE — ED Provider Notes (Signed)
Medical screening examination/treatment/procedure(s) were performed by non-physician practitioner and as supervising physician I was immediately available for consultation/collaboration.   Lennette Fader, MD 04/26/13 0703 

## 2013-06-28 HISTORY — PX: ANKLE FRACTURE SURGERY: SHX122

## 2013-07-03 ENCOUNTER — Emergency Department (HOSPITAL_COMMUNITY): Payer: Medicaid Other

## 2013-07-03 ENCOUNTER — Encounter (HOSPITAL_COMMUNITY): Payer: Self-pay | Admitting: Emergency Medicine

## 2013-07-03 DIAGNOSIS — I10 Essential (primary) hypertension: Secondary | ICD-10-CM | POA: Insufficient documentation

## 2013-07-03 DIAGNOSIS — K219 Gastro-esophageal reflux disease without esophagitis: Secondary | ICD-10-CM | POA: Insufficient documentation

## 2013-07-03 DIAGNOSIS — W108XXA Fall (on) (from) other stairs and steps, initial encounter: Secondary | ICD-10-CM | POA: Insufficient documentation

## 2013-07-03 DIAGNOSIS — E119 Type 2 diabetes mellitus without complications: Secondary | ICD-10-CM | POA: Insufficient documentation

## 2013-07-03 DIAGNOSIS — Z872 Personal history of diseases of the skin and subcutaneous tissue: Secondary | ICD-10-CM | POA: Insufficient documentation

## 2013-07-03 DIAGNOSIS — J4489 Other specified chronic obstructive pulmonary disease: Secondary | ICD-10-CM | POA: Insufficient documentation

## 2013-07-03 DIAGNOSIS — F17200 Nicotine dependence, unspecified, uncomplicated: Secondary | ICD-10-CM | POA: Insufficient documentation

## 2013-07-03 DIAGNOSIS — F3289 Other specified depressive episodes: Secondary | ICD-10-CM | POA: Insufficient documentation

## 2013-07-03 DIAGNOSIS — Z91040 Latex allergy status: Secondary | ICD-10-CM | POA: Insufficient documentation

## 2013-07-03 DIAGNOSIS — F329 Major depressive disorder, single episode, unspecified: Secondary | ICD-10-CM | POA: Insufficient documentation

## 2013-07-03 DIAGNOSIS — M129 Arthropathy, unspecified: Secondary | ICD-10-CM | POA: Insufficient documentation

## 2013-07-03 DIAGNOSIS — Y929 Unspecified place or not applicable: Secondary | ICD-10-CM | POA: Insufficient documentation

## 2013-07-03 DIAGNOSIS — Y939 Activity, unspecified: Secondary | ICD-10-CM | POA: Insufficient documentation

## 2013-07-03 DIAGNOSIS — J449 Chronic obstructive pulmonary disease, unspecified: Secondary | ICD-10-CM | POA: Insufficient documentation

## 2013-07-03 DIAGNOSIS — Z79899 Other long term (current) drug therapy: Secondary | ICD-10-CM | POA: Insufficient documentation

## 2013-07-03 DIAGNOSIS — F411 Generalized anxiety disorder: Secondary | ICD-10-CM | POA: Insufficient documentation

## 2013-07-03 DIAGNOSIS — Z7982 Long term (current) use of aspirin: Secondary | ICD-10-CM | POA: Insufficient documentation

## 2013-07-03 DIAGNOSIS — S93409A Sprain of unspecified ligament of unspecified ankle, initial encounter: Secondary | ICD-10-CM | POA: Insufficient documentation

## 2013-07-03 NOTE — ED Notes (Signed)
Pt states that she fell down 2 stairs this evening. Pt states that she already had surgery to her right ankle so she landed on her left ankle. Pt states ankle is swollen and painful to walk on. Pt able to wiggle toes.

## 2013-07-04 ENCOUNTER — Emergency Department (HOSPITAL_COMMUNITY)
Admission: EM | Admit: 2013-07-04 | Discharge: 2013-07-04 | Disposition: A | Discharge status: Home / Self Care | Payer: Medicaid Other | Attending: Emergency Medicine | Admitting: Emergency Medicine

## 2013-07-04 DIAGNOSIS — S93402A Sprain of unspecified ligament of left ankle, initial encounter: Secondary | ICD-10-CM

## 2013-07-04 MED ORDER — OXYCODONE-ACETAMINOPHEN 5-325 MG PO TABS: 1.0000 | ORAL_TABLET | Freq: Four times a day (QID) | ORAL | Status: DC | PRN

## 2013-07-04 MED ORDER — OXYCODONE-ACETAMINOPHEN 5-325 MG PO TABS
2.0000 | ORAL_TABLET | Freq: Once | ORAL | Status: AC
  Administered 2013-07-04: 2 via ORAL

## 2013-07-04 MED ORDER — OXYCODONE-ACETAMINOPHEN 5-325 MG PO TABS
ORAL_TABLET | ORAL | Status: AC
  Filled 2013-07-04: qty 2

## 2013-07-04 NOTE — ED Provider Notes (Signed)
CSN: [id number]     Arrival date & time 07/03/13  2335 History   First MD Initiated Contact with Patient 07/04/13 0112     Chief Complaint  Patient presents with  . Ankle Injury   (Consider location/radiation/quality/duration/timing/severity/associated sxs/prior Treatment) Patient is a 53 y.o. female presenting with lower extremity injury. The history is provided by the patient. No language interpreter was used.  Ankle Injury This is a new problem. The current episode started today. The problem occurs constantly. The problem has been unchanged. Associated symptoms include arthralgias, joint swelling and myalgias. Pertinent negatives include no fatigue, fever, numbness or weakness. The symptoms are aggravated by bending, walking and twisting. Treatments tried: Norco. The treatment provided mild relief.    Past Medical History  Diagnosis Date  . Hypertension   . Anxiety   . Gallstones   . GERD (gastroesophageal reflux disease)   . COPD (chronic obstructive pulmonary disease)   . Dysrhythmia     pt unsure of name of arrythmia - " my heart rate will drop all of a sudden" -no current treatment   . Headache(784.0)     migraines  . Rash     arms  . Arthritis   . Depression   . Diabetes mellitus without complication    Past Surgical History  Procedure Laterality Date  . Ectopic pregnancy surgery  many yrs ago  . Carpal tunnel release      bil  . Cholecystectomy  11/24/2011    Procedure: LAPAROSCOPIC CHOLECYSTECTOMY WITH INTRAOPERATIVE CHOLANGIOGRAM;  Surgeon: Clovis Puhomas A. Cornett, MD;  Location: [facility];  Service: General;  Laterality: N/A;  Laparoscopic Cholecystectomy with Cholangiogram   Family History  Problem Relation Age of Onset  . Breast cancer Sister   . Lung cancer Father    History  Substance Use Topics  . Smoking status: Current Every Day Smoker -- 0.50 packs/day  . Smokeless tobacco: Not on file  . Alcohol Use: No   OB History   Grav Para Term Preterm Abortions TAB  SAB Ect Mult Living                 Review of Systems  Constitutional: Negative for fever and fatigue.  Musculoskeletal: Positive for arthralgias, joint swelling and myalgias.  Skin: Negative for pallor.  Neurological: Negative for weakness and numbness.  All other systems reviewed and are negative.    Allergies  Clarithromycin; Sulfa antibiotics; Amoxicillin-pot clavulanate; Aspirin; Biaxin; Chlorthalidone; Dilaudid; Lasix; Lisinopril; and Latex  Home Medications   Current Outpatient Rx  Name  Route  Sig  Dispense  Refill  . albuterol (PROVENTIL HFA;VENTOLIN HFA) 108 (90 BASE) MCG/ACT inhaler   Inhalation   Inhale 2 puffs into the lungs every 4 (four) hours as needed. Wheezing         . ALPRAZolam (XANAX) 0.5 MG tablet   Oral   Take 0.5 mg by mouth 3 (three) times daily as needed for sleep or anxiety. Anxiety          . aspirin EC 81 MG tablet   Oral   Take 81 mg by mouth daily.         . cetirizine (ZYRTEC) 10 MG tablet   Oral   Take 10 mg by mouth daily.         . colesevelam (WELCHOL) 625 MG tablet   Oral   Take 625 mg by mouth 2 (two) times daily with a meal.         . FLUoxetine (PROZAC) 40  MG capsule   Oral   Take 40 mg by mouth daily with breakfast.          . HYDROcodone-acetaminophen (NORCO/VICODIN) 5-325 MG per tablet   Oral   Take 1 tablet by mouth every 8 (eight) hours as needed for pain.         Marland Kitchen imipramine (TOFRANIL) 25 MG tablet   Oral   Take 50 mg by mouth at bedtime.         Marland Kitchen loperamide (IMODIUM A-D) 2 MG tablet   Oral   Take 2 mg by mouth 2 (two) times daily.          . metFORMIN (GLUCOPHAGE) 500 MG tablet   Oral   Take 1,000 mg by mouth 2 (two) times daily with a meal.          . omeprazole (PRILOSEC) 20 MG capsule   Oral   Take 20 mg by mouth 2 (two) times daily.          . sodium chloride (MURO 128) 5 % ophthalmic ointment   Both Eyes   Place 1 drop into both eyes at bedtime.         . valsartan  (DIOVAN) 160 MG tablet   Oral   Take 160 mg by mouth daily with breakfast.          . oxyCODONE-acetaminophen (PERCOCET/ROXICET) 5-325 MG per tablet   Oral   Take 1-2 tablets by mouth every 6 (six) hours as needed for severe pain.   19 tablet   0    BP 156/90  Pulse 107  Temp(Src) 98 F (36.7 C) (Oral)  Resp 16  Ht 5\' 7"  (1.702 m)  Wt 168 lb (76.204 kg)  BMI 26.31 kg/m2  SpO2 97%  Physical Exam  Nursing note and vitals reviewed. Constitutional: She is oriented to person, place, and time. She appears well-developed and well-nourished. No distress.  HENT:  Head: Normocephalic and atraumatic.  Eyes: Conjunctivae and EOM are normal. No scleral icterus.  Neck: Normal range of motion.  Cardiovascular: Normal rate, regular rhythm and intact distal pulses.   Pulses:      Dorsalis pedis pulses are 2+ on the left side.       Posterior tibial pulses are 2+ on the left side.  Pulmonary/Chest: Effort normal. No respiratory distress.  Musculoskeletal: She exhibits tenderness.       Left ankle: She exhibits decreased range of motion, swelling and ecchymosis. She exhibits no deformity and no laceration. Tenderness. Lateral malleolus tenderness found. No medial malleolus tenderness found. Achilles tendon normal.  Swelling to dorsolateral aspect of L foot just distal to L ankle joint. TTP and swelling of lateral malleolus. Mild ecchymosis to affected area. Decreased ROM of L ankle joint secondary to pain.  Neurological: She is alert and oriented to person, place, and time.  No numbness, tingling or weakness of the affected extremity  Skin: Skin is warm and dry. No rash noted. She is not diaphoretic. No erythema. No pallor.  Psychiatric: She has a normal mood and affect. Her behavior is normal.    ED Course  Procedures (including critical care time) Labs Review Labs Reviewed - No data to display Imaging Review Dg Ankle Complete Left  07/04/2013   CLINICAL DATA:  Pain post trauma  EXAM:  LEFT ANKLE COMPLETE - 3+ VIEW  COMPARISON:  May 29, 2003  FINDINGS: There is mild swelling laterally. No fracture. No effusion. Ankle mortise appears intact. There is a small  spur arising from the inferior calcaneus.  IMPRESSION: Mild swelling laterally.  No fracture.  Mortise intact.   Electronically Signed   By: Bretta Bang M.D.   On: 07/04/2013 00:04    EKG Interpretation   None       MDM   1. Ankle sprain, left, initial encounter    Uncomplicated ankle sprain. Patient neurovascularly intact. No gross sensory deficits appreciated. Reflexes of LLE intact. Xray negative for fracture or dislocation. No evidence of septic joint and symptom onset after mechanical fall. ASO ankle applied and crutches given. Patient stable for d/c with orthopedic follow up. RICE advised. Return precautions provided and patient agreeable to plan with no unaddressed concerns.    Antony Madura, [name] 07/04/13 [phone]

## 2013-07-04 NOTE — Progress Notes (Signed)
Orthopedic Tech Progress Note Patient Details:  [name] 03-27-1961 [id number]  Ortho Devices Type of Ortho Device: ASO;Crutches Ortho Device/Splint Location: L LE Ortho Device/Splint Interventions: Application   Terance Pomplun T 07/04/2013, 2:50 AM

## 2013-07-04 NOTE — ED Notes (Signed)
Patient used ice pack for her ankle.  Stated that it helped a little.

## 2013-07-04 NOTE — Discharge Instructions (Signed)
Ankle Sprain °An ankle sprain is an injury to the strong, fibrous tissues (ligaments) that hold the bones of your ankle joint together.  °CAUSES °An ankle sprain is usually caused by a fall or by twisting your ankle. Ankle sprains most commonly occur when you step on the outer edge of your foot, and your ankle turns inward. People who participate in sports are more prone to these types of injuries.  °SYMPTOMS  °· Pain in your ankle. The pain may be present at rest or only when you are trying to stand or walk. °· Swelling. °· Bruising. Bruising may develop immediately or within 1 to 2 days after your injury. °· Difficulty standing or walking, particularly when turning corners or changing directions. °DIAGNOSIS  °Your caregiver will ask you details about your injury and perform a physical exam of your ankle to determine if you have an ankle sprain. During the physical exam, your caregiver will press on and apply pressure to specific areas of your foot and ankle. Your caregiver will try to move your ankle in certain ways. An X-ray exam may be done to be sure a bone was not broken or a ligament did not separate from one of the bones in your ankle (avulsion fracture).  °TREATMENT  °Certain types of braces can help stabilize your ankle. Your caregiver can make a recommendation for this. Your caregiver may recommend the use of medicine for pain. If your sprain is severe, your caregiver may refer you to a surgeon who helps to restore function to parts of your skeletal system (orthopedist) or a physical therapist. °HOME CARE INSTRUCTIONS  °· Apply ice to your injury for 1 2 days or as directed by your caregiver. Applying ice helps to reduce inflammation and pain. °· Put ice in a plastic bag. °· Place a towel between your skin and the bag. °· Leave the ice on for 15-20 minutes at a time, every 2 hours while you are awake. °· Only take over-the-counter or prescription medicines for pain, discomfort, or fever as directed by  your caregiver. °· Elevate your injured ankle above the level of your heart as much as possible for 2 3 days. °· If your caregiver recommends crutches, use them as instructed. Gradually put weight on the affected ankle. Continue to use crutches or a cane until you can walk without feeling pain in your ankle. °· If you have a plaster splint, wear the splint as directed by your caregiver. Do not rest it on anything harder than a pillow for the first 24 hours. Do not put weight on it. Do not get it wet. You may take it off to take a shower or bath. °· You may have been given an elastic bandage to wear around your ankle to provide support. If the elastic bandage is too tight (you have numbness or tingling in your foot or your foot becomes cold and blue), adjust the bandage to make it comfortable. °· If you have an air splint, you may blow more air into it or let air out to make it more comfortable. You may take your splint off at night and before taking a shower or bath. Wiggle your toes in the splint several times per day to decrease swelling. °SEEK MEDICAL CARE IF:  °· You have rapidly increasing bruising or swelling. °· Your toes feel extremely cold or you lose feeling in your foot. °· Your pain is not relieved with medicine. °SEEK IMMEDIATE MEDICAL CARE IF: °· Your toes are numb   or blue. °· You have severe pain that is increasing. °MAKE SURE YOU:  °· Understand these instructions. °· Will watch your condition. °· Will get help right away if you are not doing well or get worse. °Document Released: 06/14/2005 Document Revised: 03/08/2012 Document Reviewed: 06/26/2011 °[employer]® Patient Information ©2014 [employer]. °RICE: Routine Care for Injuries °The routine care of many injuries includes Rest, Ice, Compression, and Elevation (RICE). °HOME CARE INSTRUCTIONS °· Rest is needed to allow your body to heal. Routine activities can usually be resumed when comfortable. Injured tendons and bones can take up to 6 weeks to  heal. Tendons are the cord-like structures that attach muscle to bone. °· Ice following an injury helps keep the swelling down and reduces pain. °· Put ice in a plastic bag. °· Place a towel between your skin and the bag. °· Leave the ice on for 15-20 minutes, 03-04 times a day. Do this while awake, for the first 24 to 48 hours. After that, continue as directed by your caregiver. °· Compression helps keep swelling down. It also gives support and helps with discomfort. If an elastic bandage has been applied, it should be removed and reapplied every 3 to 4 hours. It should not be applied tightly, but firmly enough to keep swelling down. Watch fingers or toes for swelling, bluish discoloration, coldness, numbness, or excessive pain. If any of these problems occur, remove the bandage and reapply loosely. Contact your caregiver if these problems continue. °· Elevation helps reduce swelling and decreases pain. With extremities, such as the arms, hands, legs, and feet, the injured area should be placed near or above the level of the heart, if possible. °SEEK IMMEDIATE MEDICAL CARE IF: °· You have persistent pain and swelling. °· You develop redness, numbness, or unexpected weakness. °· Your symptoms are getting worse rather than improving after several days. °These symptoms may indicate that further evaluation or further X-rays are needed. Sometimes, X-rays may not show a small broken bone (fracture) until 1 week or 10 days later. Make a follow-up appointment with your caregiver. Ask when your X-ray results will be ready. Make sure you get your X-ray results. °Document Released: 09/26/2000 Document Revised: 09/06/2011 Document Reviewed: 11/13/2010 °[employer]® Patient Information ©2014 [employer]. ° °

## 2013-07-04 NOTE — ED Provider Notes (Signed)
Medical screening examination/treatment/procedure(s) were performed by non-physician practitioner and as supervising physician I was immediately available for consultation/collaboration.  EKG Interpretation   None         Glynn Octave, MD 07/04/13 [phone]

## 2013-07-18 ENCOUNTER — Emergency Department (HOSPITAL_COMMUNITY): Payer: Medicaid Other

## 2013-07-18 ENCOUNTER — Encounter (HOSPITAL_COMMUNITY): Payer: Self-pay | Admitting: Emergency Medicine

## 2013-07-18 DIAGNOSIS — E119 Type 2 diabetes mellitus without complications: Secondary | ICD-10-CM | POA: Insufficient documentation

## 2013-07-18 DIAGNOSIS — J4489 Other specified chronic obstructive pulmonary disease: Secondary | ICD-10-CM | POA: Insufficient documentation

## 2013-07-18 DIAGNOSIS — I10 Essential (primary) hypertension: Secondary | ICD-10-CM | POA: Insufficient documentation

## 2013-07-18 DIAGNOSIS — F17200 Nicotine dependence, unspecified, uncomplicated: Secondary | ICD-10-CM | POA: Insufficient documentation

## 2013-07-18 DIAGNOSIS — J449 Chronic obstructive pulmonary disease, unspecified: Secondary | ICD-10-CM | POA: Insufficient documentation

## 2013-07-18 MED ORDER — IPRATROPIUM BROMIDE 0.02 % IN SOLN
0.5000 mg | Freq: Once | RESPIRATORY_TRACT | Status: AC
  Administered 2013-07-18: 0.5 mg via RESPIRATORY_TRACT
  Filled 2013-07-18: qty 2.5

## 2013-07-18 MED ORDER — ALBUTEROL SULFATE (2.5 MG/3ML) 0.083% IN NEBU
5.0000 mg | INHALATION_SOLUTION | Freq: Once | RESPIRATORY_TRACT | Status: AC
  Administered 2013-07-18: 5 mg via RESPIRATORY_TRACT
  Filled 2013-07-18: qty 6

## 2013-07-18 NOTE — ED Notes (Signed)
The pt has a cold cough for 7 days. She has sinus congestion and chest congestion non-productive cough.  She has a thick drainage from her nose also.   She had a temp last week

## 2013-07-18 NOTE — ED Notes (Signed)
The pt still smokes.  Hx of copd.  She is constantly coughing in triage

## 2013-07-19 ENCOUNTER — Emergency Department (HOSPITAL_COMMUNITY)
Admission: EM | Admit: 2013-07-19 | Discharge: 2013-07-19 | Discharge status: Against Medical Advice | Payer: Medicaid Other | Attending: Emergency Medicine | Admitting: Emergency Medicine

## 2013-09-06 ENCOUNTER — Encounter (HOSPITAL_COMMUNITY): Payer: Self-pay | Admitting: Emergency Medicine

## 2013-09-06 ENCOUNTER — Emergency Department (HOSPITAL_COMMUNITY): Payer: Medicaid Other

## 2013-09-06 DIAGNOSIS — Z880 Allergy status to penicillin: Secondary | ICD-10-CM | POA: Insufficient documentation

## 2013-09-06 DIAGNOSIS — Y929 Unspecified place or not applicable: Secondary | ICD-10-CM | POA: Insufficient documentation

## 2013-09-06 DIAGNOSIS — I10 Essential (primary) hypertension: Secondary | ICD-10-CM | POA: Insufficient documentation

## 2013-09-06 DIAGNOSIS — Z7982 Long term (current) use of aspirin: Secondary | ICD-10-CM | POA: Insufficient documentation

## 2013-09-06 DIAGNOSIS — Y93E2 Activity, laundry: Secondary | ICD-10-CM | POA: Insufficient documentation

## 2013-09-06 DIAGNOSIS — J4489 Other specified chronic obstructive pulmonary disease: Secondary | ICD-10-CM | POA: Insufficient documentation

## 2013-09-06 DIAGNOSIS — F3289 Other specified depressive episodes: Secondary | ICD-10-CM | POA: Insufficient documentation

## 2013-09-06 DIAGNOSIS — S99919A Unspecified injury of unspecified ankle, initial encounter: Principal | ICD-10-CM

## 2013-09-06 DIAGNOSIS — F17200 Nicotine dependence, unspecified, uncomplicated: Secondary | ICD-10-CM | POA: Insufficient documentation

## 2013-09-06 DIAGNOSIS — K219 Gastro-esophageal reflux disease without esophagitis: Secondary | ICD-10-CM | POA: Insufficient documentation

## 2013-09-06 DIAGNOSIS — X500XXA Overexertion from strenuous movement or load, initial encounter: Secondary | ICD-10-CM | POA: Insufficient documentation

## 2013-09-06 DIAGNOSIS — Z91040 Latex allergy status: Secondary | ICD-10-CM | POA: Insufficient documentation

## 2013-09-06 DIAGNOSIS — F411 Generalized anxiety disorder: Secondary | ICD-10-CM | POA: Insufficient documentation

## 2013-09-06 DIAGNOSIS — M129 Arthropathy, unspecified: Secondary | ICD-10-CM | POA: Insufficient documentation

## 2013-09-06 DIAGNOSIS — S99929A Unspecified injury of unspecified foot, initial encounter: Principal | ICD-10-CM

## 2013-09-06 DIAGNOSIS — Z79899 Other long term (current) drug therapy: Secondary | ICD-10-CM | POA: Insufficient documentation

## 2013-09-06 DIAGNOSIS — S8990XA Unspecified injury of unspecified lower leg, initial encounter: Secondary | ICD-10-CM | POA: Insufficient documentation

## 2013-09-06 DIAGNOSIS — J449 Chronic obstructive pulmonary disease, unspecified: Secondary | ICD-10-CM | POA: Insufficient documentation

## 2013-09-06 DIAGNOSIS — F329 Major depressive disorder, single episode, unspecified: Secondary | ICD-10-CM | POA: Insufficient documentation

## 2013-09-06 DIAGNOSIS — E119 Type 2 diabetes mellitus without complications: Secondary | ICD-10-CM | POA: Insufficient documentation

## 2013-09-06 NOTE — ED Notes (Signed)
Pt states she was doing her son's laundry and fell down the stairs. Pt states she had previous surgery on her right ankle. Pt states that she twisted her right ankle today. Pt able to put slight pressure to ambulate on right ankle.

## 2013-09-07 ENCOUNTER — Emergency Department (HOSPITAL_COMMUNITY)
Admission: EM | Admit: 2013-09-07 | Discharge: 2013-09-07 | Disposition: A | Discharge status: Home / Self Care | Payer: Medicaid Other | Attending: Emergency Medicine | Admitting: Emergency Medicine

## 2013-09-07 DIAGNOSIS — M25571 Pain in right ankle and joints of right foot: Secondary | ICD-10-CM

## 2013-09-07 MED ORDER — OXYCODONE-ACETAMINOPHEN 5-325 MG PO TABS
1.0000 | ORAL_TABLET | Freq: Once | ORAL | Status: AC
Start: 2013-09-07 — End: 2013-09-07
  Administered 2013-09-07: 1 via ORAL
  Filled 2013-09-07: qty 1

## 2013-09-07 MED ORDER — IBUPROFEN 400 MG PO TABS
600.0000 mg | ORAL_TABLET | Freq: Once | ORAL | Status: AC
  Administered 2013-09-07: 600 mg via ORAL
  Filled 2013-09-07 (×2): qty 1

## 2013-09-07 MED ORDER — ONDANSETRON 4 MG PO TBDP
4.0000 mg | ORAL_TABLET | Freq: Once | ORAL | Status: AC
  Administered 2013-09-07: 4 mg via ORAL
  Filled 2013-09-07: qty 1

## 2013-09-07 MED ORDER — ONDANSETRON 4 MG PO TBDP: 4.0000 mg | ORAL_TABLET | Freq: Three times a day (TID) | ORAL | Status: DC | PRN

## 2013-09-07 MED ORDER — IBUPROFEN 600 MG PO TABS: 600.0000 mg | ORAL_TABLET | Freq: Four times a day (QID) | ORAL | Status: DC | PRN

## 2013-09-07 NOTE — Discharge Instructions (Signed)

## 2013-09-07 NOTE — ED Notes (Signed)
Pt states she thinks she twisted her ankle and is having pain around the area where she recently had a plate put in her ankle.

## 2013-09-09 NOTE — ED Provider Notes (Signed)
CSN: [id number]     Arrival date & time 09/06/13  2149 History   First MD Initiated Contact with Patient 09/07/13 0014     Chief Complaint  Patient presents with  . Ankle Pain     (Consider location/radiation/quality/duration/timing/severity/associated sxs/prior Treatment) Patient is a 53 y.o. female presenting with ankle pain.  Ankle Pain  53 yo female presents with right ankle pain. states she was doing laundry. Patient states she tripped down the stairs and twisted her right ankle. Pt states she had previous surgery on her right ankle.  Pt able to put some pressure on right ankle.Patient states she is having pain around the area where she recently had a plate put in her ankle from a prior distal fibula fracture. Patient concerned she may have re-injured her ankle. Pain Rated at a 9/10 and described as throbbing sharp pain. Worsened with walking.    Past Medical History  Diagnosis Date  . Hypertension   . Anxiety   . Gallstones   . GERD (gastroesophageal reflux disease)   . COPD (chronic obstructive pulmonary disease)   . Dysrhythmia     pt unsure of name of arrythmia - " my heart rate will drop all of a sudden" -no current treatment   . Headache(784.0)     migraines  . Rash     arms  . Arthritis   . Depression   . Diabetes mellitus without complication    Past Surgical History  Procedure Laterality Date  . Ectopic pregnancy surgery  many yrs ago  . Carpal tunnel release      bil  . Cholecystectomy  11/24/2011    Procedure: LAPAROSCOPIC CHOLECYSTECTOMY WITH INTRAOPERATIVE CHOLANGIOGRAM;  Surgeon: Clovis Puhomas A. Cornett, MD;  Location: [facility];  Service: General;  Laterality: N/A;  Laparoscopic Cholecystectomy with Cholangiogram   Family History  Problem Relation Age of Onset  . Breast cancer Sister   . Lung cancer Father    History  Substance Use Topics  . Smoking status: Current Every Day Smoker -- 0.50 packs/day  . Smokeless tobacco: Not on file  . Alcohol Use: No    OB History   Grav Para Term Preterm Abortions TAB SAB Ect Mult Living                 Review of Systems  All other systems reviewed and are negative.      Allergies  Clarithromycin; Sulfa antibiotics; Amoxicillin-pot clavulanate; Aspirin; Biaxin; Chlorthalidone; Dilaudid; Lasix; Lisinopril; and Latex  Home Medications   Current Outpatient Rx  Name  Route  Sig  Dispense  Refill  . albuterol (PROVENTIL HFA;VENTOLIN HFA) 108 (90 BASE) MCG/ACT inhaler   Inhalation   Inhale 2 puffs into the lungs every 4 (four) hours as needed. Wheezing         . ALPRAZolam (XANAX) 0.5 MG tablet   Oral   Take 0.5 mg by mouth 3 (three) times daily as needed for sleep or anxiety. Anxiety          . aspirin EC 81 MG tablet   Oral   Take 81 mg by mouth daily.         . cetirizine (ZYRTEC) 10 MG tablet   Oral   Take 10 mg by mouth daily as needed for allergies.          . colesevelam (WELCHOL) 625 MG tablet   Oral   Take 625 mg by mouth 2 (two) times daily with a meal.         .  FLUoxetine (PROZAC) 40 MG capsule   Oral   Take 40 mg by mouth daily with breakfast.          . HYDROcodone-acetaminophen (NORCO/VICODIN) 5-325 MG per tablet   Oral   Take 1-2 tablets by mouth every 8 (eight) hours as needed for pain.          Marland Kitchen imipramine (TOFRANIL) 25 MG tablet   Oral   Take 50 mg by mouth at bedtime.         Marland Kitchen loperamide (IMODIUM A-D) 2 MG tablet   Oral   Take 2 mg by mouth 2 (two) times daily.          . metFORMIN (GLUCOPHAGE) 500 MG tablet   Oral   Take 1,000 mg by mouth 2 (two) times daily with a meal.          . omeprazole (PRILOSEC) 20 MG capsule   Oral   Take 20 mg by mouth 2 (two) times daily.          . sodium chloride (MURO 128) 5 % ophthalmic ointment   Both Eyes   Place 1 drop into both eyes at bedtime.         . valsartan (DIOVAN) 160 MG tablet   Oral   Take 160 mg by mouth daily with breakfast.          . ibuprofen (ADVIL,MOTRIN) 600  MG tablet   Oral   Take 1 tablet (600 mg total) by mouth every 6 (six) hours as needed.   30 tablet   0   . ondansetron (ZOFRAN ODT) 4 MG disintegrating tablet   Oral   Take 1 tablet (4 mg total) by mouth every 8 (eight) hours as needed for nausea.   10 tablet   0    BP 121/82  Pulse 81  Temp(Src) 98.4 F (36.9 C) (Oral)  Resp 16  Ht 5\' 8"  (1.727 m)  Wt 164 lb (74.39 kg)  BMI 24.94 kg/m2  SpO2 100% Physical Exam  Nursing note and vitals reviewed. Constitutional: She is oriented to person, place, and time. She appears well-developed and well-nourished. No distress.  HENT:  Head: Normocephalic and atraumatic.  Eyes: Conjunctivae are normal.  Neck: Normal range of motion. Neck supple. No JVD present. No tracheal deviation present.  Cardiovascular: Normal rate and regular rhythm.  Exam reveals no gallop and no friction rub.   No murmur heard. Pulmonary/Chest: Effort normal. No respiratory distress. She has no wheezes. She has no rhonchi. She has no rales.  Musculoskeletal: Normal range of motion. She exhibits no edema.  Scar noted to Right lateral Malleolus. No obvious swelling, bruising, or deformity of affected ankle. No bony tenderness noted. Pain illicited with inversion of Right ankle. Patient able to bear weight on ankle. Distal pulses intact. Good sensation and strength equal bilaterally.   Neurological: She is alert and oriented to person, place, and time.  Skin: Skin is warm and dry. She is not diaphoretic.  Psychiatric: She has a normal mood and affect. Her behavior is normal.    ED Course  Procedures (including critical care time) Labs Review Labs Reviewed - No data to display Imaging Review No results found.   EKG Interpretation None      MDM   Final diagnoses:  Ankle pain, right   Patient afebrile with normal VS.  Patient concerned for Right ankle after mechanical fall earlier today. Patient states she had surgery on right ankle in past and wanted to  make sure it was not messed up. Plain films negative for acute fracture or acute abnormality. Status post internal fixation of old distal fibular fracture. Patient reassured that injury is benign and should improved with conservative tx. Patient confirms understanding. Plan to treat sxs and have patient follow up with PCP as needed if symptoms fail to resolve with conservative tx. Patient agrees with plan. Discharged in good condition.  Meds given in ED:  Medications  oxyCODONE-acetaminophen (PERCOCET/ROXICET) 5-325 MG per tablet 1 tablet (1 tablet Oral Given 09/07/13 0134)  ondansetron (ZOFRAN-ODT) disintegrating tablet 4 mg (4 mg Oral Given 09/07/13 0133)  ibuprofen (ADVIL,MOTRIN) tablet 600 mg (600 mg Oral Given 09/07/13 0134)    Discharge Medication List as of 09/07/2013  1:43 AM    START taking these medications   Details  ibuprofen (ADVIL,MOTRIN) 600 MG tablet Take 1 tablet (600 mg total) by mouth every 6 (six) hours as needed., Starting 09/07/2013, Until Discontinued, Print    ondansetron (ZOFRAN ODT) 4 MG disintegrating tablet Take 1 tablet (4 mg total) by mouth every 8 (eight) hours as needed for nausea., Starting 09/07/2013, Until Discontinued, Print          Rudene Anda, [name] 09/09/13 1229

## 2013-09-10 NOTE — ED Provider Notes (Signed)
Medical screening examination/treatment/procedure(s) were performed by non-physician practitioner and as supervising physician I was immediately available for consultation/collaboration.   EKG Interpretation None        Loranzo Desha, MD 09/10/13 1836 

## 2013-09-18 ENCOUNTER — Encounter (HOSPITAL_COMMUNITY): Payer: Self-pay | Admitting: Emergency Medicine

## 2013-09-18 ENCOUNTER — Emergency Department (HOSPITAL_COMMUNITY): Payer: Medicaid Other

## 2013-09-18 ENCOUNTER — Emergency Department (HOSPITAL_COMMUNITY)
Admission: EM | Admit: 2013-09-18 | Discharge: 2013-09-19 | Disposition: A | Discharge status: Home / Self Care | Payer: Medicaid Other | Attending: Emergency Medicine | Admitting: Emergency Medicine

## 2013-09-18 DIAGNOSIS — F411 Generalized anxiety disorder: Secondary | ICD-10-CM | POA: Insufficient documentation

## 2013-09-18 DIAGNOSIS — J40 Bronchitis, not specified as acute or chronic: Secondary | ICD-10-CM

## 2013-09-18 DIAGNOSIS — Z880 Allergy status to penicillin: Secondary | ICD-10-CM | POA: Insufficient documentation

## 2013-09-18 DIAGNOSIS — S99929A Unspecified injury of unspecified foot, initial encounter: Secondary | ICD-10-CM

## 2013-09-18 DIAGNOSIS — S8990XA Unspecified injury of unspecified lower leg, initial encounter: Secondary | ICD-10-CM | POA: Insufficient documentation

## 2013-09-18 DIAGNOSIS — R0789 Other chest pain: Secondary | ICD-10-CM

## 2013-09-18 DIAGNOSIS — F3289 Other specified depressive episodes: Secondary | ICD-10-CM | POA: Insufficient documentation

## 2013-09-18 DIAGNOSIS — J441 Chronic obstructive pulmonary disease with (acute) exacerbation: Secondary | ICD-10-CM | POA: Insufficient documentation

## 2013-09-18 DIAGNOSIS — Z79899 Other long term (current) drug therapy: Secondary | ICD-10-CM | POA: Insufficient documentation

## 2013-09-18 DIAGNOSIS — K219 Gastro-esophageal reflux disease without esophagitis: Secondary | ICD-10-CM | POA: Insufficient documentation

## 2013-09-18 DIAGNOSIS — IMO0002 Reserved for concepts with insufficient information to code with codable children: Secondary | ICD-10-CM | POA: Insufficient documentation

## 2013-09-18 DIAGNOSIS — I10 Essential (primary) hypertension: Secondary | ICD-10-CM | POA: Insufficient documentation

## 2013-09-18 DIAGNOSIS — E119 Type 2 diabetes mellitus without complications: Secondary | ICD-10-CM | POA: Insufficient documentation

## 2013-09-18 DIAGNOSIS — G43909 Migraine, unspecified, not intractable, without status migrainosus: Secondary | ICD-10-CM | POA: Insufficient documentation

## 2013-09-18 DIAGNOSIS — F17200 Nicotine dependence, unspecified, uncomplicated: Secondary | ICD-10-CM | POA: Insufficient documentation

## 2013-09-18 DIAGNOSIS — Z7982 Long term (current) use of aspirin: Secondary | ICD-10-CM | POA: Insufficient documentation

## 2013-09-18 DIAGNOSIS — S99919A Unspecified injury of unspecified ankle, initial encounter: Secondary | ICD-10-CM

## 2013-09-18 DIAGNOSIS — F329 Major depressive disorder, single episode, unspecified: Secondary | ICD-10-CM | POA: Insufficient documentation

## 2013-09-18 DIAGNOSIS — Z91040 Latex allergy status: Secondary | ICD-10-CM | POA: Insufficient documentation

## 2013-09-18 DIAGNOSIS — Z9889 Other specified postprocedural states: Secondary | ICD-10-CM | POA: Insufficient documentation

## 2013-09-18 DIAGNOSIS — M129 Arthropathy, unspecified: Secondary | ICD-10-CM | POA: Insufficient documentation

## 2013-09-18 LAB — BASIC METABOLIC PANEL
BUN: 7 mg/dL (ref 6–23)
CO2: 24 mEq/L (ref 19–32)
Calcium: 9.2 mg/dL (ref 8.4–10.5)
Chloride: 98 mEq/L (ref 96–112)
Creatinine, Ser: 0.52 mg/dL (ref 0.50–1.10)
GFR calc non Af Amer: >90 mL/min (ref >90)
Glucose, Bld: 222 mg/dL — ABNORMAL HIGH (ref 70–99)
POTASSIUM: 3.5 meq/L — AB (ref 3.7–5.3)
Sodium: 138 mEq/L (ref 137–147)

## 2013-09-18 LAB — CBC
HCT: 41.1 % (ref 36.0–46.0)
Hemoglobin: 13.9 g/dL (ref 12.0–15.0)
MCH: 27.5 pg (ref 26.0–34.0)
MCHC: 33.8 g/dL (ref 30.0–36.0)
MCV: 81.4 fL (ref 78.0–100.0)
Platelets: 198 10*3/uL (ref 150–400)
RBC: 5.05 MIL/uL (ref 3.87–5.11)
RDW: 16.8 % — ABNORMAL HIGH (ref 11.5–15.5)
WBC: 11.9 10*3/uL — ABNORMAL HIGH (ref 4.0–10.5)

## 2013-09-18 LAB — I-STAT TROPONIN, ED: Troponin i, poc: 0 ng/mL (ref 0.00–0.08)

## 2013-09-18 LAB — PRO B NATRIURETIC PEPTIDE: Pro B Natriuretic peptide (BNP): 35.7 pg/mL (ref 0–125)
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
## 2013-09-18 NOTE — ED Notes (Signed)
Pt. reports intermittent mid chest pain with SOB and dry cough / nausea for 4 days .

## 2013-09-19 ENCOUNTER — Encounter (HOSPITAL_COMMUNITY): Payer: Self-pay | Admitting: Radiology

## 2013-09-19 ENCOUNTER — Emergency Department (HOSPITAL_COMMUNITY): Payer: Medicaid Other

## 2013-09-19 LAB — I-STAT TROPONIN, ED: Troponin i, poc: 0 ng/mL (ref 0.00–0.08)

## 2013-09-19 LAB — D-DIMER, QUANTITATIVE: D-Dimer, Quant: 1.52 ug/mL-FEU — ABNORMAL HIGH (ref 0.00–0.48)

## 2013-09-19 MED ORDER — HYDROCOD POLST-CHLORPHEN POLST 10-8 MG/5ML PO LQCR
5.0000 mL | Freq: Once | ORAL | Status: AC
  Administered 2013-09-19: 5 mL via ORAL
  Filled 2013-09-19: qty 5

## 2013-09-19 MED ORDER — PREDNISONE 20 MG PO TABS
60.0000 mg | ORAL_TABLET | Freq: Once | ORAL | Status: AC
  Administered 2013-09-19: 60 mg via ORAL
  Filled 2013-09-19: qty 3

## 2013-09-19 MED ORDER — BENZONATATE 100 MG PO CAPS: 100.0000 mg | ORAL_CAPSULE | Freq: Three times a day (TID) | ORAL | Status: DC

## 2013-09-19 MED ORDER — IPRATROPIUM BROMIDE 0.02 % IN SOLN
0.5000 mg | Freq: Once | RESPIRATORY_TRACT | Status: AC
  Administered 2013-09-19: 0.5 mg via RESPIRATORY_TRACT
  Filled 2013-09-19: qty 2.5

## 2013-09-19 MED ORDER — GUAIFENESIN ER 600 MG PO TB12: 600.0000 mg | ORAL_TABLET | Freq: Two times a day (BID) | ORAL | Status: DC

## 2013-09-19 MED ORDER — ALBUTEROL SULFATE HFA 108 (90 BASE) MCG/ACT IN AERS
2.0000 | INHALATION_SPRAY | RESPIRATORY_TRACT | Status: DC | PRN
  Administered 2013-09-19: 2 via RESPIRATORY_TRACT
  Filled 2013-09-19: qty 6.7

## 2013-09-19 MED ORDER — HYDROCOD POLST-CHLORPHEN POLST 10-8 MG/5ML PO LQCR: 5.0000 mL | Freq: Two times a day (BID) | ORAL | Status: DC

## 2013-09-19 MED ORDER — ALBUTEROL SULFATE (2.5 MG/3ML) 0.083% IN NEBU
5.0000 mg | INHALATION_SOLUTION | Freq: Once | RESPIRATORY_TRACT | Status: AC
  Administered 2013-09-19: 5 mg via RESPIRATORY_TRACT
  Filled 2013-09-19: qty 6

## 2013-09-19 MED ORDER — PREDNISONE 10 MG PO TABS: 20.0000 mg | ORAL_TABLET | Freq: Every day | ORAL | Status: DC

## 2013-09-19 MED ORDER — ASPIRIN 81 MG PO CHEW
324.0000 mg | CHEWABLE_TABLET | Freq: Once | ORAL | Status: AC
  Administered 2013-09-19: 324 mg via ORAL
  Filled 2013-09-19: qty 4

## 2013-09-19 MED ORDER — OXYCODONE-ACETAMINOPHEN 5-325 MG PO TABS
2.0000 | ORAL_TABLET | Freq: Once | ORAL | Status: AC
  Administered 2013-09-19: 2 via ORAL
  Filled 2013-09-19: qty 2

## 2013-09-19 MED ORDER — IOHEXOL 350 MG/ML SOLN
100.0000 mL | Freq: Once | INTRAVENOUS | Status: AC | PRN
Start: 2013-09-19 — End: 2013-09-19
  Administered 2013-09-19: 100 mL via INTRAVENOUS
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
## 2013-09-19 NOTE — ED Provider Notes (Signed)
Medical screening examination/treatment/procedure(s) were performed by non-physician practitioner and as supervising physician I was immediately available for consultation/collaboration.   EKG Interpretation None        Brandt Loosen, MD 09/19/13 [phone]

## 2013-09-19 NOTE — Discharge Instructions (Signed)
Your lab testing, EKGs of your heart, chest x-rays and CAT scans have not shown any signs for an emergent condition. At this time your providers feel your symptoms are caused by bronchitis and musculoskeletal chest wall pain. Please followup with your primary care provider this week for continued evaluation and treatment. Return any time for changing or worsening symptoms.    Bronchitis Bronchitis is inflammation of the airways that extend from the windpipe into the lungs (bronchi). The inflammation often causes mucus to develop, which leads to a cough. If the inflammation becomes severe, it may cause shortness of breath. CAUSES  Bronchitis may be caused by:   Viral infections.   Bacteria.   Cigarette smoke.   Allergens, pollutants, and other irritants.  SIGNS AND SYMPTOMS  The most common symptom of bronchitis is a frequent cough that produces mucus. Other symptoms include:  Fever.   Body aches.   Chest congestion.   Chills.   Shortness of breath.   Sore throat.  DIAGNOSIS  Bronchitis is usually diagnosed through a medical history and physical exam. Tests, such as chest X-rays, are sometimes done to rule out other conditions.  TREATMENT  You may need to avoid contact with whatever caused the problem (smoking, for example). Medicines are sometimes needed. These may include:  Antibiotics. These may be prescribed if the condition is caused by bacteria.  Cough suppressants. These may be prescribed for relief of cough symptoms.   Inhaled medicines. These may be prescribed to help open your airways and make it easier for you to breathe.   Steroid medicines. These may be prescribed for those with recurrent (chronic) bronchitis. HOME CARE INSTRUCTIONS  Get plenty of rest.   Drink enough fluids to keep your urine clear or pale yellow (unless you have a medical condition that requires fluid restriction). Increasing fluids may help thin your secretions and will  prevent dehydration.   Only take over-the-counter or prescription medicines as directed by your health care provider.  Only take antibiotics as directed. Make sure you finish them even if you start to feel better.  Avoid secondhand smoke, irritating chemicals, and strong fumes. These will make bronchitis worse. If you are a smoker, quit smoking. Consider using nicotine gum or skin patches to help control withdrawal symptoms. Quitting smoking will help your lungs heal faster.   Put a cool-mist humidifier in your bedroom at night to moisten the air. This may help loosen mucus. Change the water in the humidifier daily. You can also run the hot water in your shower and sit in the bathroom with the door closed for 5 10 minutes.   Follow up with your health care provider as directed.   Wash your hands frequently to avoid catching bronchitis again or spreading an infection to others.  SEEK MEDICAL CARE IF: Your symptoms do not improve after 1 week of treatment.  SEEK IMMEDIATE MEDICAL CARE IF:  Your fever increases.  You have chills.   You have chest pain.   You have worsening shortness of breath.   You have bloody sputum.  You faint.  You have lightheadedness.  You have a severe headache.   You vomit repeatedly. MAKE SURE YOU:   Understand these instructions.  Will watch your condition.  Will get help right away if you are not doing well or get worse. Document Released: 06/14/2005 Document Revised: 04/04/2013 Document Reviewed: 02/06/2013 [facility] Patient Information 2014 [place], [place].

## 2013-09-19 NOTE — ED Notes (Signed)
MD at bedside. [name]

## 2013-09-19 NOTE — ED Notes (Signed)
Pt done with breathing treatment.  Xray notified to pick up pt.

## 2013-09-30 ENCOUNTER — Emergency Department (HOSPITAL_COMMUNITY)
Admission: EM | Admit: 2013-09-30 | Discharge: 2013-09-30 | Disposition: A | Discharge status: Home / Self Care | Payer: Medicaid Other | Attending: Emergency Medicine | Admitting: Emergency Medicine

## 2013-09-30 ENCOUNTER — Encounter (HOSPITAL_COMMUNITY): Payer: Self-pay | Admitting: Emergency Medicine

## 2013-09-30 DIAGNOSIS — K219 Gastro-esophageal reflux disease without esophagitis: Secondary | ICD-10-CM | POA: Insufficient documentation

## 2013-09-30 DIAGNOSIS — Z7982 Long term (current) use of aspirin: Secondary | ICD-10-CM | POA: Insufficient documentation

## 2013-09-30 DIAGNOSIS — E119 Type 2 diabetes mellitus without complications: Secondary | ICD-10-CM | POA: Insufficient documentation

## 2013-09-30 DIAGNOSIS — I10 Essential (primary) hypertension: Secondary | ICD-10-CM | POA: Insufficient documentation

## 2013-09-30 DIAGNOSIS — F411 Generalized anxiety disorder: Secondary | ICD-10-CM | POA: Insufficient documentation

## 2013-09-30 DIAGNOSIS — I499 Cardiac arrhythmia, unspecified: Secondary | ICD-10-CM | POA: Insufficient documentation

## 2013-09-30 DIAGNOSIS — M129 Arthropathy, unspecified: Secondary | ICD-10-CM | POA: Insufficient documentation

## 2013-09-30 DIAGNOSIS — Z792 Long term (current) use of antibiotics: Secondary | ICD-10-CM | POA: Insufficient documentation

## 2013-09-30 DIAGNOSIS — F17200 Nicotine dependence, unspecified, uncomplicated: Secondary | ICD-10-CM | POA: Insufficient documentation

## 2013-09-30 DIAGNOSIS — Z91040 Latex allergy status: Secondary | ICD-10-CM | POA: Insufficient documentation

## 2013-09-30 DIAGNOSIS — L03115 Cellulitis of right lower limb: Secondary | ICD-10-CM

## 2013-09-30 DIAGNOSIS — J449 Chronic obstructive pulmonary disease, unspecified: Secondary | ICD-10-CM | POA: Insufficient documentation

## 2013-09-30 DIAGNOSIS — R21 Rash and other nonspecific skin eruption: Secondary | ICD-10-CM | POA: Insufficient documentation

## 2013-09-30 DIAGNOSIS — L02419 Cutaneous abscess of limb, unspecified: Secondary | ICD-10-CM | POA: Insufficient documentation

## 2013-09-30 DIAGNOSIS — L03119 Cellulitis of unspecified part of limb: Principal | ICD-10-CM

## 2013-09-30 DIAGNOSIS — G43909 Migraine, unspecified, not intractable, without status migrainosus: Secondary | ICD-10-CM | POA: Insufficient documentation

## 2013-09-30 DIAGNOSIS — Z79899 Other long term (current) drug therapy: Secondary | ICD-10-CM | POA: Insufficient documentation

## 2013-09-30 DIAGNOSIS — Z872 Personal history of diseases of the skin and subcutaneous tissue: Secondary | ICD-10-CM

## 2013-09-30 DIAGNOSIS — F3289 Other specified depressive episodes: Secondary | ICD-10-CM | POA: Insufficient documentation

## 2013-09-30 DIAGNOSIS — J4489 Other specified chronic obstructive pulmonary disease: Secondary | ICD-10-CM | POA: Insufficient documentation

## 2013-09-30 DIAGNOSIS — Z880 Allergy status to penicillin: Secondary | ICD-10-CM | POA: Insufficient documentation

## 2013-09-30 DIAGNOSIS — F329 Major depressive disorder, single episode, unspecified: Secondary | ICD-10-CM | POA: Insufficient documentation

## 2013-09-30 DIAGNOSIS — IMO0002 Reserved for concepts with insufficient information to code with codable children: Secondary | ICD-10-CM | POA: Insufficient documentation

## 2013-09-30 MED ORDER — HYDROCODONE-ACETAMINOPHEN 5-325 MG PO TABS
2.0000 | ORAL_TABLET | Freq: Once | ORAL | Status: AC
  Administered 2013-09-30: 2 via ORAL
  Filled 2013-09-30: qty 2

## 2013-09-30 MED ORDER — HYDROCODONE-ACETAMINOPHEN 5-325 MG PO TABS: 2.0000 | ORAL_TABLET | ORAL | Status: DC | PRN

## 2013-09-30 MED ORDER — CLINDAMYCIN HCL 150 MG PO CAPS: 300.0000 mg | ORAL_CAPSULE | Freq: Three times a day (TID) | ORAL | Status: DC

## 2013-09-30 MED ORDER — CLINDAMYCIN HCL 300 MG PO CAPS
300.0000 mg | ORAL_CAPSULE | Freq: Once | ORAL | Status: AC
  Administered 2013-09-30: 300 mg via ORAL
  Filled 2013-09-30: qty 1

## 2013-09-30 NOTE — ED Notes (Signed)
Pt. reports rash at left arm onset this morning and righ ankle chronic pain worse today denies recent fall or injury .

## 2013-09-30 NOTE — ED Provider Notes (Signed)
CSN: [id number]     Arrival date & time 09/30/13  0128 History   First MD Initiated Contact with Patient 09/30/13 [phone]     Chief Complaint  Patient presents with  . Rash  . Ankle Pain     (Consider location/radiation/quality/duration/timing/severity/associated sxs/prior Treatment) HPI Comments: Pt is a 53 y/o with hx of R ankle fracture and repair 6 months ago.  She has developed rash on the bialteral arms and a rash to the R ankle.  This started a couple of days ago, is persistent, moderate and the rash on teh ankle is gradually getting worse.  She has some pain with ambulation but denies fevers, cnills, nausea or vomiting.  The 2 rashes appeared differently. She does report taking doxycycline for a bronchitis recently finishing within the last 48 hours.  Patient is a 53 y.o. female presenting with rash and ankle pain. The history is provided by the patient.  Rash Ankle Pain   Past Medical History  Diagnosis Date  . Hypertension   . Anxiety   . Gallstones   . GERD (gastroesophageal reflux disease)   . COPD (chronic obstructive pulmonary disease)   . Dysrhythmia     pt unsure of name of arrythmia - " my heart rate will drop all of a sudden" -no current treatment   . Headache(784.0)     migraines  . Rash     arms  . Arthritis   . Depression   . Diabetes mellitus without complication    Past Surgical History  Procedure Laterality Date  . Ectopic pregnancy surgery  many yrs ago  . Carpal tunnel release      bil  . Cholecystectomy  11/24/2011    Procedure: LAPAROSCOPIC CHOLECYSTECTOMY WITH INTRAOPERATIVE CHOLANGIOGRAM;  Surgeon: Clovis Pu. Cornett, MD;  Location: [facility];  Service: General;  Laterality: N/A;  Laparoscopic Cholecystectomy with Cholangiogram   Family History  Problem Relation Age of Onset  . Breast cancer Sister   . Lung cancer Father    History  Substance Use Topics  . Smoking status: Current Every Day Smoker -- 0.50 packs/day  . Smokeless tobacco: Not on  file  . Alcohol Use: No   OB History   Grav Para Term Preterm Abortions TAB SAB Ect Mult Living                 Review of Systems  Skin: Positive for rash.  All other systems reviewed and are negative.      Allergies  Clarithromycin; Sulfa antibiotics; Amoxicillin-pot clavulanate; Aspirin; Biaxin; Chlorthalidone; Dilaudid; Lasix; Lisinopril; and Latex  Home Medications   Current Outpatient Rx  Name  Route  Sig  Dispense  Refill  . albuterol (PROVENTIL HFA;VENTOLIN HFA) 108 (90 BASE) MCG/ACT inhaler   Inhalation   Inhale 2 puffs into the lungs every 4 (four) hours as needed for wheezing. Wheezing         . ALPRAZolam (XANAX) 0.5 MG tablet   Oral   Take 0.5 mg by mouth 3 (three) times daily as needed for sleep or anxiety. Anxiety         . aspirin EC 81 MG tablet   Oral   Take 81 mg by mouth daily.         . benzonatate (TESSALON) 100 MG capsule   Oral   Take 1 capsule (100 mg total) by mouth every 8 (eight) hours.   21 capsule   0   . cetirizine (ZYRTEC) 10 MG tablet   Oral  Take 10 mg by mouth daily as needed for allergies.          . chlorpheniramine-HYDROcodone (TUSSIONEX PENNKINETIC ER) 10-8 MG/5ML LQCR   Oral   Take 5 mLs by mouth every 12 (twelve) hours. Take 5 mLs by mouth every 12 (twelve) hours.   140 mL   0   . clindamycin (CLEOCIN) 150 MG capsule   Oral   Take 2 capsules (300 mg total) by mouth 3 (three) times daily. May dispense as 150mg  capsules   60 capsule   0   . colesevelam (WELCHOL) 625 MG tablet   Oral   Take 625 mg by mouth 2 (two) times daily with a meal.         . FLUoxetine (PROZAC) 40 MG capsule   Oral   Take 40 mg by mouth daily with breakfast.          . guaiFENesin (MUCINEX) 600 MG 12 hr tablet   Oral   Take 1 tablet (600 mg total) by mouth 2 (two) times daily.   20 tablet   0   . HYDROcodone-acetaminophen (NORCO/VICODIN) 5-325 MG per tablet   Oral   Take 1-2 tablets by mouth every 8 (eight) hours as  needed for moderate pain.          Marland Kitchen. HYDROcodone-acetaminophen (NORCO/VICODIN) 5-325 MG per tablet   Oral   Take 2 tablets by mouth every 4 (four) hours as needed.   10 tablet   0   . ibuprofen (ADVIL,MOTRIN) 600 MG tablet   Oral   Take 600 mg by mouth every 6 (six) hours as needed for moderate pain.         Marland Kitchen. imipramine (TOFRANIL) 25 MG tablet   Oral   Take 50 mg by mouth at bedtime.         Marland Kitchen. loperamide (IMODIUM A-D) 2 MG tablet   Oral   Take 2 mg by mouth 2 (two) times daily.          . metFORMIN (GLUCOPHAGE) 500 MG tablet   Oral   Take 1,000 mg by mouth 2 (two) times daily with a meal.          . omeprazole (PRILOSEC) 20 MG capsule   Oral   Take 20 mg by mouth 2 (two) times daily.          . ondansetron (ZOFRAN-ODT) 4 MG disintegrating tablet   Oral   Take 4 mg by mouth every 8 (eight) hours as needed for nausea.         . predniSONE (DELTASONE) 10 MG tablet   Oral   Take 2 tablets (20 mg total) by mouth daily.   10 tablet   0   . sodium chloride (MURO 128) 5 % ophthalmic ointment   Both Eyes   Place 1 drop into both eyes at bedtime.         . valsartan (DIOVAN) 160 MG tablet   Oral   Take 160 mg by mouth daily with breakfast.           BP 134/78  Pulse 102  Temp(Src) 98.8 F (37.1 C) (Oral)  Resp 14  Ht 5\' 8"  (1.727 m)  Wt 162 lb (73.483 kg)  BMI 24.64 kg/m2  SpO2 98% Physical Exam  Nursing note and vitals reviewed. Constitutional: She appears well-developed and well-nourished. No distress.  HENT:  Head: Normocephalic and atraumatic.  Mouth/Throat: Oropharynx is clear and moist. No oropharyngeal exudate.  Eyes: Conjunctivae and  EOM are normal. Pupils are equal, round, and reactive to light. Right eye exhibits no discharge. Left eye exhibits no discharge. No scleral icterus.  Neck: Normal range of motion. Neck supple. No JVD present. No thyromegaly present.  Cardiovascular: Normal rate, regular rhythm, normal heart sounds and  intact distal pulses.  Exam reveals no gallop and no friction rub.   No murmur heard. Pulmonary/Chest: Effort normal and breath sounds normal. No respiratory distress. She has no wheezes. She has no rales.  Abdominal: Soft. Bowel sounds are normal. She exhibits no distension and no mass. There is no tenderness.  Musculoskeletal: Normal range of motion. She exhibits no edema and no tenderness.  Lymphadenopathy:    She has no cervical adenopathy.  Neurological: She is alert. Coordination normal.  Skin: Skin is warm and dry. Rash noted. No erythema.  2 discrete rashes  #1 bilateral upper extremity rash with a erythematous non-papular, nonvesicular, slight hypopigmented rash which is nonpruritic and there is no associated pustules or excoriations.  #2 right ankle erythema which is warm, tender and appears to be consistent with a cellulitis. This are round the surgical scar on the ankle. There is no drainage, no significant decreased range of motion of the ankle, no circumferential erythema. As was outlined by myself with a marking pen  Psychiatric: She has a normal mood and affect. Her behavior is normal.    ED Course  Procedures (including critical care time) Labs Review Labs Reviewed - No data to display Imaging Review No results found.    MDM   Final diagnoses:  Cellulitis of right ankle  H/O drug-induced photosensitivity    The patient likely has a photosensitivity rash due to the doxycycline as she states that her arms have been in the sunlight, the lower extremity rash is likely a cellulitis. I do have some concern that this is over his surgical site however the patient is afebrile and has good range of motion of the ankle with minimal pain suggesting that this is more likely to be a cellulitis than a deep tissue infection. I have recommended to the patient that she follow up with orthopedist on Monday or Tuesday, she has agreed, she appears nontoxic and will be discharged on  clindamycin as she has a recent course of doxycycline which did not stop this rash and a sulfa allergy.  Meds given in ED:  Medications  clindamycin (CLEOCIN) capsule 300 mg (not administered)  HYDROcodone-acetaminophen (NORCO/VICODIN) 5-325 MG per tablet 2 tablet (2 tablets Oral Given 09/30/13 0429)    New Prescriptions   CLINDAMYCIN (CLEOCIN) 150 MG CAPSULE    Take 2 capsules (300 mg total) by mouth 3 (three) times daily. May dispense as 150mg  capsules   HYDROCODONE-ACETAMINOPHEN (NORCO/VICODIN) 5-325 MG PER TABLET    Take 2 tablets by mouth every 4 (four) hours as needed.      Vida Roller, MD 09/30/13 [phone]

## 2013-09-30 NOTE — Discharge Instructions (Signed)
Keep your arms out of the sunshine - use Clindamycin antiobiotic for the skin infection on your ankle.  Return for high fevers, worsening pain or swelling or redness.  Please call your doctor for a followup appointment within 24-48 hours. When you talk to your doctor please let them know that you were seen in the emergency department and have them acquire all of your records so that they can discuss the findings with you and formulate a treatment plan to fully care for your new and ongoing problems.

## 2013-10-04 ENCOUNTER — Other Ambulatory Visit (HOSPITAL_COMMUNITY): Payer: Self-pay

## 2013-10-04 DIAGNOSIS — J45909 Unspecified asthma, uncomplicated: Secondary | ICD-10-CM

## 2013-10-04 LAB — PULMONARY FUNCTION TEST
DL/VA % pred: 88 %
DL/VA: 4.8 ml/min/mmHg/L
DLCO UNC % PRED: 76 %
DLCO UNC: 24.73 ml/min/mmHg
DLCO cor % pred: 76 %
DLCO cor: 24.73 ml/min/mmHg
FEF 25-75 Post: 2.9 L/sec
FEF 25-75 Pre: 1.8 L/sec
FEF2575-%Change-Post: 61 %
FEF2575-%Pred-Post: 95 %
FEF2575-%Pred-Pre: 59 %
FEV1-%CHANGE-POST: 13 %
FEV1-%PRED-POST: 79 %
FEV1-%Pred-Pre: 69 %
FEV1-POST: 2.66 L
FEV1-Pre: 2.34 L
FEV1FVC-%CHANGE-POST: 2 %
FEV1FVC-%Pred-Pre: 93 %
FEV6-%Change-Post: 10 %
FEV6-%PRED-PRE: 75 %
FEV6-%Pred-Post: 83 %
FEV6-POST: 3.46 L
FEV6-PRE: 3.12 L
FEV6FVC-%CHANGE-POST: 0 %
FEV6FVC-%PRED-POST: 102 %
FEV6FVC-%PRED-PRE: 102 %
FVC-%CHANGE-POST: 10 %
FVC-%PRED-PRE: 73 %
FVC-%Pred-Post: 81 %
FVC-PRE: 3.13 L
FVC-Post: 3.46 L
POST FEV6/FVC RATIO: 100 %
PRE FEV6/FVC RATIO: 100 %
Post FEV1/FVC ratio: 77 %
Pre FEV1/FVC ratio: 75 %
RV % pred: 63 %
RV: 1.34 L
TLC % pred: 79 %
TLC: 4.76 L

## 2013-10-11 ENCOUNTER — Ambulatory Visit (HOSPITAL_COMMUNITY)
Admission: RE | Admit: 2013-10-11 | Discharge: 2013-10-11 | Disposition: A | Discharge status: Home / Self Care | Payer: Medicaid Other | Source: Ambulatory Visit | Attending: Family Medicine | Admitting: Family Medicine

## 2013-10-11 DIAGNOSIS — J45909 Unspecified asthma, uncomplicated: Secondary | ICD-10-CM | POA: Insufficient documentation

## 2013-10-11 MED ORDER — ALBUTEROL SULFATE (2.5 MG/3ML) 0.083% IN NEBU
2.5000 mg | INHALATION_SOLUTION | Freq: Once | RESPIRATORY_TRACT | Status: AC
  Administered 2013-10-11: 2.5 mg via RESPIRATORY_TRACT

## 2013-10-25 ENCOUNTER — Emergency Department (HOSPITAL_COMMUNITY)
Admission: EM | Admit: 2013-10-25 | Discharge: 2013-10-25 | Disposition: A | Discharge status: Home / Self Care | Payer: Medicaid Other | Attending: Emergency Medicine | Admitting: Emergency Medicine

## 2013-10-25 ENCOUNTER — Emergency Department (HOSPITAL_COMMUNITY): Payer: Medicaid Other

## 2013-10-25 ENCOUNTER — Encounter (HOSPITAL_COMMUNITY): Payer: Self-pay | Admitting: Emergency Medicine

## 2013-10-25 DIAGNOSIS — F411 Generalized anxiety disorder: Secondary | ICD-10-CM | POA: Insufficient documentation

## 2013-10-25 DIAGNOSIS — R21 Rash and other nonspecific skin eruption: Secondary | ICD-10-CM | POA: Insufficient documentation

## 2013-10-25 DIAGNOSIS — Z79899 Other long term (current) drug therapy: Secondary | ICD-10-CM | POA: Insufficient documentation

## 2013-10-25 DIAGNOSIS — E119 Type 2 diabetes mellitus without complications: Secondary | ICD-10-CM | POA: Insufficient documentation

## 2013-10-25 DIAGNOSIS — R0602 Shortness of breath: Secondary | ICD-10-CM | POA: Insufficient documentation

## 2013-10-25 DIAGNOSIS — F3289 Other specified depressive episodes: Secondary | ICD-10-CM | POA: Insufficient documentation

## 2013-10-25 DIAGNOSIS — J4489 Other specified chronic obstructive pulmonary disease: Secondary | ICD-10-CM | POA: Insufficient documentation

## 2013-10-25 DIAGNOSIS — F329 Major depressive disorder, single episode, unspecified: Secondary | ICD-10-CM | POA: Insufficient documentation

## 2013-10-25 DIAGNOSIS — R110 Nausea: Secondary | ICD-10-CM | POA: Insufficient documentation

## 2013-10-25 DIAGNOSIS — Z7982 Long term (current) use of aspirin: Secondary | ICD-10-CM | POA: Insufficient documentation

## 2013-10-25 DIAGNOSIS — R109 Unspecified abdominal pain: Secondary | ICD-10-CM | POA: Insufficient documentation

## 2013-10-25 DIAGNOSIS — J449 Chronic obstructive pulmonary disease, unspecified: Secondary | ICD-10-CM | POA: Insufficient documentation

## 2013-10-25 DIAGNOSIS — M129 Arthropathy, unspecified: Secondary | ICD-10-CM | POA: Insufficient documentation

## 2013-10-25 DIAGNOSIS — B029 Zoster without complications: Secondary | ICD-10-CM | POA: Insufficient documentation

## 2013-10-25 DIAGNOSIS — F17200 Nicotine dependence, unspecified, uncomplicated: Secondary | ICD-10-CM | POA: Insufficient documentation

## 2013-10-25 DIAGNOSIS — Z880 Allergy status to penicillin: Secondary | ICD-10-CM | POA: Insufficient documentation

## 2013-10-25 DIAGNOSIS — R0789 Other chest pain: Secondary | ICD-10-CM

## 2013-10-25 DIAGNOSIS — K219 Gastro-esophageal reflux disease without esophagitis: Secondary | ICD-10-CM | POA: Insufficient documentation

## 2013-10-25 DIAGNOSIS — Z91040 Latex allergy status: Secondary | ICD-10-CM | POA: Insufficient documentation

## 2013-10-25 DIAGNOSIS — I10 Essential (primary) hypertension: Secondary | ICD-10-CM | POA: Insufficient documentation

## 2013-10-25 LAB — CBC WITH DIFFERENTIAL/PLATELET
BASOS PCT: 1 % (ref 0–1)
Basophils Absolute: 0.1 10*3/uL (ref 0.0–0.1)
EOS ABS: 0.3 10*3/uL (ref 0.0–0.7)
Eosinophils Relative: 4 % (ref 0–5)
HEMATOCRIT: 38.9 % (ref 36.0–46.0)
Hemoglobin: 12.4 g/dL (ref 12.0–15.0)
LYMPHS ABS: 2.3 10*3/uL (ref 0.7–4.0)
Lymphocytes Relative: 29 % (ref 12–46)
MCH: 26.6 pg (ref 26.0–34.0)
MCHC: 31.9 g/dL (ref 30.0–36.0)
MCV: 83.5 fL (ref 78.0–100.0)
MONO ABS: 0.4 10*3/uL (ref 0.1–1.0)
Monocytes Relative: 5 % (ref 3–12)
NEUTROS ABS: 5.1 10*3/uL (ref 1.7–7.7)
NEUTROS PCT: 61 % (ref 43–77)
Platelets: 176 10*3/uL (ref 150–400)
RBC: 4.66 MIL/uL (ref 3.87–5.11)
RDW: 16.6 % — ABNORMAL HIGH (ref 11.5–15.5)
WBC: 8.1 10*3/uL (ref 4.0–10.5)

## 2013-10-25 LAB — LIPASE, BLOOD: Lipase: 36 U/L (ref 11–59)

## 2013-10-25 LAB — COMPREHENSIVE METABOLIC PANEL
ALK PHOS: 140 U/L — AB (ref 39–117)
ALT: 38 U/L — ABNORMAL HIGH (ref 0–35)
AST: 30 U/L (ref 0–37)
Albumin: 3.5 g/dL (ref 3.5–5.2)
BUN: 7 mg/dL (ref 6–23)
CHLORIDE: 101 meq/L (ref 96–112)
CO2: 26 mEq/L (ref 19–32)
CREATININE: 0.53 mg/dL (ref 0.50–1.10)
Calcium: 9.3 mg/dL (ref 8.4–10.5)
GFR calc non Af Amer: >90 mL/min (ref >90)
GLUCOSE: 163 mg/dL — AB (ref 70–99)
POTASSIUM: 4.1 meq/L (ref 3.7–5.3)
Sodium: 140 mEq/L (ref 137–147)
Total Protein: 7 g/dL (ref 6.0–8.3)

## 2013-10-25 LAB — D-DIMER, QUANTITATIVE (NOT AT ARMC): D DIMER QUANT: 1.85 ug{FEU}/mL — AB (ref 0.00–0.48)

## 2013-10-25 LAB — TROPONIN I: Troponin I: <0.3 ng/mL (ref <0.30)

## 2013-10-25 MED ORDER — VALACYCLOVIR HCL 1 G PO TABS: 1000.0000 mg | ORAL_TABLET | Freq: Three times a day (TID) | ORAL | Status: AC

## 2013-10-25 MED ORDER — IOHEXOL 350 MG/ML SOLN
80.0000 mL | Freq: Once | INTRAVENOUS | Status: AC | PRN
  Administered 2013-10-25: 80 mL via INTRAVENOUS

## 2013-10-25 MED ORDER — OXYCODONE-ACETAMINOPHEN 5-325 MG PO TABS: 2.0000 | ORAL_TABLET | ORAL | Status: DC | PRN

## 2013-10-25 MED ORDER — IBUPROFEN 800 MG PO TABS: 800.0000 mg | ORAL_TABLET | Freq: Three times a day (TID) | ORAL | Status: DC

## 2013-10-25 MED ORDER — VALACYCLOVIR HCL 500 MG PO TABS
1000.0000 mg | ORAL_TABLET | Freq: Once | ORAL | Status: AC
  Administered 2013-10-25: 1000 mg via ORAL
  Filled 2013-10-25: qty 2

## 2013-10-25 MED ORDER — HYDROCODONE-ACETAMINOPHEN 5-325 MG PO TABS
2.0000 | ORAL_TABLET | Freq: Once | ORAL | Status: AC
  Administered 2013-10-25: 2 via ORAL
  Filled 2013-10-25: qty 2

## 2013-10-25 NOTE — ED Provider Notes (Signed)
CSN: [id number]     Arrival date & time 10/25/13  1324 History   First MD Initiated Contact with Patient 10/25/13 [phone]     Chief Complaint  Patient presents with  . Numbness     (Consider location/radiation/quality/duration/timing/severity/associated sxs/prior Treatment) HPI Comments: Patient complains of pain to the left side of her rib cage that onset 2 nights ago. The pain is constant but waxes and wanes in severity. It is persistent. It is worse with movement and worse with palpation. She denies any injury. Is associated with shortness of breath and nausea. Denies any fever or cough. She taking Vicodin for the pain with minimal relief. She was up all night for the pain which radiates to her chest and mid back. The pain is not exertional. She denies any fever, chills, vomiting or abdominal pain. No urinary or vaginal symptoms. Denies any cardiac history. She has a history of diabetes, COPD, hypertension. She's also and was wearing a boot on her right leg for a fracture that is still healing per her.  The history is provided by the patient. The history is limited by the condition of the patient.    Past Medical History  Diagnosis Date  . Hypertension   . Anxiety   . Gallstones   . GERD (gastroesophageal reflux disease)   . COPD (chronic obstructive pulmonary disease)   . Dysrhythmia     pt unsure of name of arrythmia - " my heart rate will drop all of a sudden" -no current treatment   . Headache(784.0)     migraines  . Rash     arms  . Arthritis   . Depression   . Diabetes mellitus without complication    Past Surgical History  Procedure Laterality Date  . Ectopic pregnancy surgery  many yrs ago  . Carpal tunnel release      bil  . Cholecystectomy  11/24/2011    Procedure: LAPAROSCOPIC CHOLECYSTECTOMY WITH INTRAOPERATIVE CHOLANGIOGRAM;  Surgeon: Clovis Pu. Cornett, MD;  Location: [facility];  Service: General;  Laterality: N/A;  Laparoscopic Cholecystectomy with Cholangiogram    Family History  Problem Relation Age of Onset  . Breast cancer Sister   . Lung cancer Father    History  Substance Use Topics  . Smoking status: Current Every Day Smoker -- 0.50 packs/day  . Smokeless tobacco: Never Used  . Alcohol Use: No   OB History   Grav Para Term Preterm Abortions TAB SAB Ect Mult Living                 Review of Systems  Constitutional: Negative for fever, activity change and appetite change.  HENT: Negative for congestion and nosebleeds.   Eyes: Negative for visual disturbance.  Respiratory: Positive for shortness of breath. Negative for chest tightness.   Cardiovascular: Positive for chest pain.  Gastrointestinal: Positive for nausea and abdominal pain. Negative for vomiting.  Genitourinary: Negative for dysuria and hematuria.  Musculoskeletal: Negative for arthralgias, back pain and myalgias.  Skin: Positive for rash.  Neurological: Negative for dizziness, weakness and headaches.  A complete 10 system review of systems was obtained and all systems are negative except as noted in the HPI and PMH.      Allergies  Clarithromycin; Sulfa antibiotics; Amoxicillin-pot clavulanate; Aspirin; Biaxin; Chlorthalidone; Dilaudid; Lasix; Lisinopril; and Latex  Home Medications   Prior to Admission medications   Medication Sig Start Date End Date Taking? Authorizing Provider  albuterol (PROVENTIL HFA;VENTOLIN HFA) 108 (90 BASE) MCG/ACT inhaler Inhale 2  puffs into the lungs every 4 (four) hours as needed for wheezing. Wheezing    Historical Provider, MD  ALPRAZolam [name]) 0.5 MG tablet Take 0.5 mg by mouth 3 (three) times daily as needed for sleep or anxiety. Anxiety    Historical Provider, MD  aspirin EC 81 MG tablet Take 81 mg by mouth daily.    Historical Provider, MD  benzonatate (TESSALON) 100 MG capsule Take 1 capsule (100 mg total) by mouth every 8 (eight) hours. 09/19/13   Phill Mutter Dammen, [name]  cetirizine (ZYRTEC) 10 MG tablet Take 10 mg by mouth daily  as needed for allergies.     Historical Provider, MD  colesevelam (WELCHOL) 625 MG tablet Take 625 mg by mouth 2 (two) times daily with a meal.    Historical Provider, MD  FLUoxetine (PROZAC) 40 MG capsule Take 40 mg by mouth daily with breakfast.     Historical Provider, MD  HYDROcodone-acetaminophen (NORCO/VICODIN) 5-325 MG per tablet Take 1-2 tablets by mouth every 8 (eight) hours as needed for moderate pain.     Historical Provider, MD  HYDROcodone-acetaminophen (NORCO/VICODIN) 5-325 MG per tablet Take 2 tablets by mouth every 4 (four) hours as needed. 09/30/13   Vida Roller, MD  imipramine (TOFRANIL) 25 MG tablet Take 50 mg by mouth at bedtime.    Historical Provider, MD  loperamide (IMODIUM A-D) 2 MG tablet Take 2 mg by mouth 2 (two) times daily.     Historical Provider, MD  metFORMIN (GLUCOPHAGE) 500 MG tablet Take 1,000 mg by mouth 2 (two) times daily with a meal.     Historical Provider, MD  omeprazole (PRILOSEC) 20 MG capsule Take 20 mg by mouth 2 (two) times daily.     Historical Provider, MD  sodium chloride (MURO 128) 5 % ophthalmic ointment Place 1 drop into both eyes at bedtime.    Historical Provider, MD  valsartan (DIOVAN) 160 MG tablet Take 160 mg by mouth daily with breakfast.     Historical Provider, MD   BP 143/92  Pulse 87  Temp(Src) 98.2 F (36.8 C) (Oral)  Resp 16  Ht 5\' 8"  (1.727 m)  Wt 162 lb (73.483 kg)  BMI 24.64 kg/m2  SpO2 97% Physical Exam  Constitutional: She is oriented to person, place, and time. She appears well-developed and well-nourished. No distress.  HENT:  Head: Normocephalic and atraumatic.  Mouth/Throat: Oropharynx is clear and moist. No oropharyngeal exudate.  Eyes: Conjunctivae and EOM are normal. Pupils are equal, round, and reactive to light.  Neck: Normal range of motion. Neck supple.  Cardiovascular: Normal rate, regular rhythm and normal heart sounds.   No murmur heard. Pulmonary/Chest: Effort normal and breath sounds normal. No  respiratory distress. She exhibits tenderness.  TTP L lower ribs in dermatomal distribution.   Erythematous patch to L lateral ribs with possible early vesicle  Abdominal: Soft. There is no tenderness. There is no rebound.  Musculoskeletal: Normal range of motion. She exhibits no edema and no tenderness.  Neurological: She is alert and oriented to person, place, and time. No cranial nerve deficit. She exhibits normal muscle tone. Coordination normal.  Skin: Skin is warm.    ED Course  Procedures (including critical care time) Labs Review Labs Reviewed  CBC WITH DIFFERENTIAL - Abnormal; Notable for the following:    RDW 16.6 (*)    All other components within normal limits  COMPREHENSIVE METABOLIC PANEL - Abnormal; Notable for the following:    Glucose, Bld 163 (*)  ALT 38 (*)    Alkaline Phosphatase 140 (*)    Total Bilirubin <0.2 (*)    All other components within normal limits  D-DIMER, QUANTITATIVE - Abnormal; Notable for the following:    D-Dimer, Quant 1.85 (*)    All other components within normal limits  TROPONIN I  LIPASE, BLOOD  TROPONIN I    Imaging Review Dg Chest 2 View  10/25/2013   CLINICAL DATA:  Left chest pain  EXAM: CHEST  2 VIEW  COMPARISON:  CT ANGIO CHEST W/CM &/OR WO/CM dated 09/19/2013; DG CHEST 2 VIEW dated 09/18/2013  FINDINGS: Normal mediastinum and cardiac silhouette. Normal pulmonary vasculature. No evidence of effusion, infiltrate, or pneumothorax. No acute bony abnormality.  IMPRESSION: No acute cardiopulmonary process.   Electronically Signed   By: Genevive BiStewart  Edmunds M.D.   On: 10/25/2013 07:44   Ct Angio Chest Pe W/cm &/or Wo Cm  10/25/2013   CLINICAL DATA:  Left-sided chest pain radiating to back for several days. Concern for pulmonary embolism  EXAM: CT ANGIOGRAPHY CHEST WITH CONTRAST  TECHNIQUE: Multidetector CT imaging of the chest was performed using the standard protocol during bolus administration of intravenous contrast. Multiplanar CT image  reconstructions and MIPs were obtained to evaluate the vascular anatomy.  CONTRAST:  80mL OMNIPAQUE IOHEXOL 350 MG/ML SOLN  COMPARISON:  None.  FINDINGS: There are no filling defects within the pulmonary arteries to suggest acute pulmonary embolism. No acute findings of the aorta or great vessels. No pericardial fluid. Esophagus is normal.  Review of the lung parenchyma demonstrates mild bibasilar atelectasis. No pleural fluid or pneumothorax.  No axillary or supraclavicular adenopathy. No mediastinal lymphadenopathy. Limited view of the upper abdomen is unremarkable. Limited view of the skeleton is unremarkable.  Review of the MIP images confirms the above findings.  IMPRESSION: 1. No evidence of acute pulmonary embolism. 2. Mild bibasilar atelectasis.   Electronically Signed   By: Genevive BiStewart  Edmunds M.D.   On: 10/25/2013 09:03     EKG Interpretation   Date/Time:  Thursday October 25 2013 06:23:39 EDT Ventricular Rate:  92 PR Interval:  144 QRS Duration: 80 QT Interval:  376 QTC Calculation: 464 R Axis:   63 Text Interpretation:  Normal sinus rhythm ST abnormality, possible  digitalis effect Abnormal ECG No significant change was found Confirmed by  Manus GunningANCOUR  MD, Penelope Fittro ([id number]) on 10/25/2013 6:59:39 AM      MDM   Final diagnoses:  Atypical chest pain  Herpes zoster   Constant left-sided rib pain for the past 2 days that is worse with palpation and movement.   EKG is unchanged. patient with no history of MI.   Pain is constant, low suspicion for ACS. EKG unchanged. Will check D-dimer given use of walking boot. Consider early zoster given erythematous rash on L chest. D-dimer positive. CTPE Negative.  Troponin negative x 2.   We'll treat for likely early herpes zoster with antivirals pain medication. Patient is diabetic so we'll not prescribe steroids. Follow up with PCP. Return precautions discussed.   Glynn OctaveStephen Hermilo Dutter, MD 10/25/13 [phone]

## 2013-10-25 NOTE — Discharge Instructions (Signed)
Shingles Take the antiviral medication as prescribed. Followup with her Dr. this week. The rash may progress. Return to the ED if you develop new or worsening symptoms. Shingles (herpes zoster) is an infection that is caused by the same virus that causes chickenpox (varicella). The infection causes a painful skin rash and fluid-filled blisters, which eventually break open, crust over, and heal. It may occur in any area of the body, but it usually affects only one side of the body or face. The pain of shingles usually lasts about 1 month. However, some people with shingles may develop long-term (chronic) pain in the affected area of the body. Shingles often occurs many years after the person had chickenpox. It is more common:  In people older than 50 years.  In people with weakened immune systems, such as those with HIV, AIDS, or cancer.  In people taking medicines that weaken the immune system, such as transplant medicines.  In people under great stress. CAUSES  Shingles is caused by the varicella zoster virus (VZV), which also causes chickenpox. After a person is infected with the virus, it can remain in the person's body for years in an inactive state (dormant). To cause shingles, the virus reactivates and breaks out as an infection in a nerve root. The virus can be spread from person to person (contagious) through contact with open blisters of the shingles rash. It will only spread to people who have not had chickenpox. When these people are exposed to the virus, they may develop chickenpox. They will not develop shingles. Once the blisters scab over, the person is no longer contagious and cannot spread the virus to others. SYMPTOMS  Shingles shows up in stages. The initial symptoms may be pain, itching, and tingling in an area of the skin. This pain is usually described as burning, stabbing, or throbbing.In a few days or weeks, a painful red rash will appear in the area where the pain, itching,  and tingling were felt. The rash is usually on one side of the body in a band or belt-like pattern. Then, the rash usually turns into fluid-filled blisters. They will scab over and dry up in approximately 2 3 weeks. Flu-like symptoms may also occur with the initial symptoms, the rash, or the blisters. These may include:  Fever.  Chills.  Headache.  Upset stomach. DIAGNOSIS  Your caregiver will perform a skin exam to diagnose shingles. Skin scrapings or fluid samples may also be taken from the blisters. This sample will be examined under a microscope or sent to a lab for further testing. TREATMENT  There is no specific cure for shingles. Your caregiver will likely prescribe medicines to help you manage the pain, recover faster, and avoid long-term problems. This may include antiviral drugs, anti-inflammatory drugs, and pain medicines. HOME CARE INSTRUCTIONS   Take a cool bath or apply cool compresses to the area of the rash or blisters as directed. This may help with the pain and itching.   Only take over-the-counter or prescription medicines as directed by your caregiver.   Rest as directed by your caregiver.  Keep your rash and blisters clean with mild soap and cool water or as directed by your caregiver.  Do not pick your blisters or scratch your rash. Apply an anti-itch cream or numbing creams to the affected area as directed by your caregiver.  Keep your shingles rash covered with a loose bandage (dressing).  Avoid skin contact with:  Babies.   Pregnant women.   Children  with eczema.   Elderly people with transplants.   People with chronic illnesses, such as leukemia or AIDS.   Wear loose-fitting clothing to help ease the pain of material rubbing against the rash.  Keep all follow-up appointments with your caregiver.If the area involved is on your face, you may receive a referral for follow-up to a specialist, such as an eye doctor (ophthalmologist) or an ear,  nose, and throat (ENT) doctor. Keeping all follow-up appointments will help you avoid eye complications, chronic pain, or disability.  SEEK IMMEDIATE MEDICAL CARE IF:   You have facial pain, pain around the eye area, or loss of feeling on one side of your face.  You have ear pain or ringing in your ear.  You have loss of taste.  Your pain is not relieved with prescribed medicines.   Your redness or swelling spreads.   You have more pain and swelling.  Your condition is worsening or has changed.   You have a feveror persistent symptoms for more than 2 3 days.  You have a fever and your symptoms suddenly get worse. MAKE SURE YOU:  Understand these instructions.  Will watch your condition.  Will get help right away if you are not doing well or get worse. Document Released: 06/14/2005 Document Revised: 03/08/2012 Document Reviewed: 01/27/2012 [facility] Patient Information 2014 [place], [place].

## 2013-10-25 NOTE — ED Notes (Signed)
Dr. Rancour at the bedside.  

## 2013-10-25 NOTE — ED Notes (Signed)
Painful when taking deep breath in and out.

## 2013-10-25 NOTE — ED Notes (Signed)
Small red area on left side where pain is, per patient. States hurts to touch, hurts inside, hx of chicken pox

## 2013-10-25 NOTE — ED Notes (Signed)
Patient presents stating that she has pain to her left upper quad that radiates to her left arm and chest, nauseated

## 2014-06-17 ENCOUNTER — Emergency Department (HOSPITAL_COMMUNITY)
Admission: EM | Admit: 2014-06-17 | Discharge: 2014-06-17 | Disposition: A | Discharge status: Home / Self Care | Payer: Medicaid Other | Attending: Emergency Medicine | Admitting: Emergency Medicine

## 2014-06-17 ENCOUNTER — Encounter (HOSPITAL_COMMUNITY): Payer: Self-pay | Admitting: *Deleted

## 2014-06-17 DIAGNOSIS — L03113 Cellulitis of right upper limb: Secondary | ICD-10-CM | POA: Diagnosis not present

## 2014-06-17 DIAGNOSIS — F329 Major depressive disorder, single episode, unspecified: Secondary | ICD-10-CM | POA: Insufficient documentation

## 2014-06-17 DIAGNOSIS — L259 Unspecified contact dermatitis, unspecified cause: Secondary | ICD-10-CM | POA: Diagnosis not present

## 2014-06-17 DIAGNOSIS — Z791 Long term (current) use of non-steroidal anti-inflammatories (NSAID): Secondary | ICD-10-CM | POA: Diagnosis not present

## 2014-06-17 DIAGNOSIS — I10 Essential (primary) hypertension: Secondary | ICD-10-CM | POA: Diagnosis not present

## 2014-06-17 DIAGNOSIS — M1990 Unspecified osteoarthritis, unspecified site: Secondary | ICD-10-CM | POA: Diagnosis not present

## 2014-06-17 DIAGNOSIS — Z91040 Latex allergy status: Secondary | ICD-10-CM | POA: Diagnosis not present

## 2014-06-17 DIAGNOSIS — J449 Chronic obstructive pulmonary disease, unspecified: Secondary | ICD-10-CM | POA: Diagnosis not present

## 2014-06-17 DIAGNOSIS — R21 Rash and other nonspecific skin eruption: Secondary | ICD-10-CM | POA: Diagnosis present

## 2014-06-17 DIAGNOSIS — Z720 Tobacco use: Secondary | ICD-10-CM | POA: Diagnosis not present

## 2014-06-17 DIAGNOSIS — Z792 Long term (current) use of antibiotics: Secondary | ICD-10-CM | POA: Diagnosis not present

## 2014-06-17 DIAGNOSIS — Z880 Allergy status to penicillin: Secondary | ICD-10-CM | POA: Diagnosis not present

## 2014-06-17 DIAGNOSIS — K219 Gastro-esophageal reflux disease without esophagitis: Secondary | ICD-10-CM | POA: Diagnosis not present

## 2014-06-17 DIAGNOSIS — F419 Anxiety disorder, unspecified: Secondary | ICD-10-CM | POA: Diagnosis not present

## 2014-06-17 DIAGNOSIS — Z7982 Long term (current) use of aspirin: Secondary | ICD-10-CM | POA: Diagnosis not present

## 2014-06-17 DIAGNOSIS — E119 Type 2 diabetes mellitus without complications: Secondary | ICD-10-CM | POA: Insufficient documentation

## 2014-06-17 DIAGNOSIS — Z79899 Other long term (current) drug therapy: Secondary | ICD-10-CM | POA: Insufficient documentation

## 2014-06-17 MED ORDER — PREDNISONE 50 MG PO TABS: ORAL_TABLET | ORAL | Status: DC

## 2014-06-17 MED ORDER — CEPHALEXIN 500 MG PO CAPS: 500.0000 mg | ORAL_CAPSULE | Freq: Four times a day (QID) | ORAL | Status: DC

## 2014-06-17 MED ORDER — DEXAMETHASONE SODIUM PHOSPHATE 10 MG/ML IJ SOLN
10.0000 mg | Freq: Once | INTRAMUSCULAR | Status: AC
  Administered 2014-06-17: 10 mg via INTRAMUSCULAR
  Filled 2014-06-17: qty 1

## 2014-06-17 MED ORDER — HYDROXYZINE HCL 25 MG PO TABS: 25.0000 mg | ORAL_TABLET | Freq: Four times a day (QID) | ORAL | Status: DC | PRN

## 2014-06-17 NOTE — ED Provider Notes (Signed)
CSN: [id number]     Arrival date & time 06/17/14  2224 History  This chart was scribed for non-physician practitioner working with Audree CamelScott T Goldston, MD by Elveria Risingimelie Horne, ED Scribe. This patient was seen in room TR08C/TR08C and the patient's care was started at 11:05 PM.   Chief Complaint  Patient presents with  . Rash   The history is provided by the patient. No language interpreter was used.   HPI Comments: [name] is a 53 y.o. female who presents to the Emergency Department complaining of burning pruritic rash to her arms bilaterally, back, abdomen for three weeks. She developed new areas of redness over the past few days. Patient reports intermittent low grade fevers for the last few days measured at 101F. Patient denies recent contacts with similar rash or new dermatologic products, new environment, or pets. Patient reports history of folliculitis.  Denies changes in personal care products including detergents, soaps, shampoos, lotions, perfumes. Denies new clothing or furniture.  Denies travel, visiting other people's houses.  Patient reports treatment with Benadryl and ibuprofen 800mg , without treatment. Patient denies itching or swelling in her mouth, or difficulty breathing or swallowing. Patient's PCP is Dr. Clovis RileyMitchell.  Denies any close contacts with rash.   Past Medical History  Diagnosis Date  . Hypertension   . Anxiety   . Gallstones   . GERD (gastroesophageal reflux disease)   . COPD (chronic obstructive pulmonary disease)   . Dysrhythmia     pt unsure of name of arrythmia - " my heart rate will drop all of a sudden" -no current treatment   . Headache(784.0)     migraines  . Rash     arms  . Arthritis   . Depression   . Diabetes mellitus without complication    Past Surgical History  Procedure Laterality Date  . Ectopic pregnancy surgery  many yrs ago  . Carpal tunnel release      bil  . Cholecystectomy  11/24/2011    Procedure: LAPAROSCOPIC CHOLECYSTECTOMY WITH  INTRAOPERATIVE CHOLANGIOGRAM;  Surgeon: Clovis Puhomas A. Cornett, MD;  Location: [facility];  Service: General;  Laterality: N/A;  Laparoscopic Cholecystectomy with Cholangiogram   Family History  Problem Relation Age of Onset  . Breast cancer Sister   . Lung cancer Father    History  Substance Use Topics  . Smoking status: Current Every Day Smoker -- 0.50 packs/day  . Smokeless tobacco: Never Used  . Alcohol Use: No   OB History    No data available     Review of Systems  Constitutional: Negative for fever and chills.  HENT: Negative for sore throat and trouble swallowing.   Respiratory: Negative for shortness of breath.   Skin: Positive for color change and rash.   Allergies  Clarithromycin; Sulfa antibiotics; Amoxicillin-pot clavulanate; Aspirin; Biaxin; Chlorthalidone; Dilaudid; Lasix; Lisinopril; and Latex  Home Medications   Prior to Admission medications   Medication Sig Start Date End Date Taking? Authorizing Provider  albuterol (PROVENTIL HFA;VENTOLIN HFA) 108 (90 BASE) MCG/ACT inhaler Inhale 2 puffs into the lungs every 4 (four) hours as needed for wheezing. Wheezing    Historical Provider, MD  ALPRAZolam [name](XANAX) 0.5 MG tablet Take 1 mg by mouth 2 (two) times daily. Anxiety    Historical Provider, MD  aspirin EC 81 MG tablet Take 81 mg by mouth daily.    Historical Provider, MD  benzonatate (TESSALON) 100 MG capsule Take 1 capsule (100 mg total) by mouth every 8 (eight) hours. 09/19/13  Phill Mutter Dammen, [name]  cetirizine (ZYRTEC) 10 MG tablet Take 10 mg by mouth daily as needed for allergies.     Historical Provider, MD  clindamycin (CLEOCIN) 150 MG capsule Take 150 mg by mouth 2 (two) times daily.    Historical Provider, MD  colesevelam (WELCHOL) 625 MG tablet Take 625 mg by mouth 2 (two) times daily with a meal.    Historical Provider, MD  FLUoxetine (PROZAC) 40 MG capsule Take 40 mg by mouth daily with breakfast.     Historical Provider, MD  HYDROcodone-acetaminophen  (NORCO/VICODIN) 5-325 MG per tablet Take 2 tablets by mouth every 4 (four) hours as needed. 09/30/13   Vida Roller, MD  ibuprofen (ADVIL,MOTRIN) 800 MG tablet Take 1 tablet (800 mg total) by mouth 3 (three) times daily. 10/25/13   Glynn Octave, MD  imipramine (TOFRANIL) 25 MG tablet Take 50 mg by mouth at bedtime. Take 2 hours before bedtime    Historical Provider, MD  metFORMIN (GLUCOPHAGE) 500 MG tablet Take 500 mg by mouth 2 (two) times daily with a meal.     Historical Provider, MD  omeprazole (PRILOSEC) 20 MG capsule Take 20 mg by mouth 2 (two) times daily.     Historical Provider, MD  oxyCODONE-acetaminophen (PERCOCET/ROXICET) 5-325 MG per tablet Take 2 tablets by mouth every 4 (four) hours as needed for severe pain. 10/25/13   Glynn Octave, MD  sodium chloride (MURO 128) 5 % ophthalmic ointment Place 1 application into both eyes at bedtime.     Historical Provider, MD  valsartan (DIOVAN) 160 MG tablet Take 160 mg by mouth daily with breakfast.     Historical Provider, MD   Triage Vitals: BP 144/91 mmHg  Pulse 98  Temp(Src) 98 F (36.7 C) (Oral)  Resp 18  SpO2 98% Physical Exam  Constitutional: She appears well-developed and well-nourished. No distress.  HENT:  Head: Normocephalic and atraumatic.  Neck: Neck supple.  Pulmonary/Chest: Effort normal.  Neurological: She is alert.  Skin: Rash noted. She is not diaphoretic.  Erythematous papular rash, excoriated and with multiple scabs largely over bilateral arms with few scattered lesions over trunk.  Right forearm with two distinct areas of localized erythema, edema, warmth, tenderness.  No induration or fluctuance.    Nursing note and vitals reviewed.   ED Course  Procedures (including critical care time)  COORDINATION OF CARE: 10:57 PM- Discussed treatment plan with patient at bedside and patient agreed to plan.   Labs Review Labs Reviewed - No data to display  Imaging Review No results found.   EKG  Interpretation None      MDM   Final diagnoses:  Contact dermatitis  Cellulitis of right upper extremity    Afebrile, nontoxic patient with pruritic rash with two areas of superinfection.  Pt has scratched open sores over her bilateral arms.  There are two focal areas of cellulitis without e/o abscess.  She has uncontrolled itching.  No airway symptoms or concerns.   D/C home with keflex, vistaril, prednisone.  PCP follow up.  Discussed result, findings, treatment, and follow up  with patient.  Pt given return precautions.  Pt verbalizes understanding and agrees with plan.       I personally performed the services described in this documentation, which was scribed in my presence. The recorded information has been reviewed and is accurate.    [place], [name] 06/18/14 0005  [name], MD 06/18/14 [phone]

## 2014-06-17 NOTE — ED Notes (Signed)
The pt has  Had a rash over her body  For 3 weeks

## 2014-06-17 NOTE — Discharge Instructions (Signed)
Read the information below.  Use the prescribed medication as directed.  Please discuss all new medications with your pharmacist.  You may return to the Emergency Department at any time for worsening condition or any new symptoms that concern you.   If you develop increased redness, swelling, pus draining from the wound, or fevers greater than 100.4, return to the ER immediately for a recheck.   °

## 2014-08-19 ENCOUNTER — Encounter (HOSPITAL_COMMUNITY): Payer: Self-pay | Admitting: *Deleted

## 2014-08-19 ENCOUNTER — Emergency Department (HOSPITAL_COMMUNITY)
Admission: EM | Admit: 2014-08-19 | Discharge: 2014-08-19 | Disposition: A | Discharge status: Home / Self Care | Payer: Medicaid Other | Attending: Emergency Medicine | Admitting: Emergency Medicine

## 2014-08-19 ENCOUNTER — Emergency Department (HOSPITAL_COMMUNITY): Payer: Medicaid Other

## 2014-08-19 DIAGNOSIS — Z91040 Latex allergy status: Secondary | ICD-10-CM | POA: Diagnosis not present

## 2014-08-19 DIAGNOSIS — Z79899 Other long term (current) drug therapy: Secondary | ICD-10-CM | POA: Diagnosis not present

## 2014-08-19 DIAGNOSIS — M1990 Unspecified osteoarthritis, unspecified site: Secondary | ICD-10-CM | POA: Diagnosis not present

## 2014-08-19 DIAGNOSIS — Z720 Tobacco use: Secondary | ICD-10-CM | POA: Insufficient documentation

## 2014-08-19 DIAGNOSIS — Z791 Long term (current) use of non-steroidal anti-inflammatories (NSAID): Secondary | ICD-10-CM | POA: Insufficient documentation

## 2014-08-19 DIAGNOSIS — Z792 Long term (current) use of antibiotics: Secondary | ICD-10-CM | POA: Diagnosis not present

## 2014-08-19 DIAGNOSIS — I10 Essential (primary) hypertension: Secondary | ICD-10-CM | POA: Insufficient documentation

## 2014-08-19 DIAGNOSIS — Z7982 Long term (current) use of aspirin: Secondary | ICD-10-CM | POA: Diagnosis not present

## 2014-08-19 DIAGNOSIS — M25571 Pain in right ankle and joints of right foot: Secondary | ICD-10-CM | POA: Diagnosis present

## 2014-08-19 DIAGNOSIS — F419 Anxiety disorder, unspecified: Secondary | ICD-10-CM | POA: Diagnosis not present

## 2014-08-19 DIAGNOSIS — J449 Chronic obstructive pulmonary disease, unspecified: Secondary | ICD-10-CM | POA: Diagnosis not present

## 2014-08-19 DIAGNOSIS — E119 Type 2 diabetes mellitus without complications: Secondary | ICD-10-CM | POA: Insufficient documentation

## 2014-08-19 DIAGNOSIS — K219 Gastro-esophageal reflux disease without esophagitis: Secondary | ICD-10-CM | POA: Insufficient documentation

## 2014-08-19 DIAGNOSIS — F329 Major depressive disorder, single episode, unspecified: Secondary | ICD-10-CM | POA: Diagnosis not present

## 2014-08-19 DIAGNOSIS — L03115 Cellulitis of right lower limb: Secondary | ICD-10-CM | POA: Diagnosis not present

## 2014-08-19 DIAGNOSIS — Z880 Allergy status to penicillin: Secondary | ICD-10-CM | POA: Diagnosis not present

## 2014-08-19 LAB — CBG MONITORING, ED: Glucose-Capillary: 116 mg/dL — ABNORMAL HIGH (ref 70–99)

## 2014-08-19 MED ORDER — CLINDAMYCIN HCL 150 MG PO CAPS: 300.0000 mg | ORAL_CAPSULE | Freq: Three times a day (TID) | ORAL | Status: DC

## 2014-08-19 MED ORDER — OXYCODONE-ACETAMINOPHEN 5-325 MG PO TABS: 1.0000 | ORAL_TABLET | ORAL | Status: DC | PRN

## 2014-08-19 MED ORDER — OXYCODONE-ACETAMINOPHEN 5-325 MG PO TABS
2.0000 | ORAL_TABLET | Freq: Once | ORAL | Status: AC
  Administered 2014-08-19: 2 via ORAL
  Filled 2014-08-19: qty 2

## 2014-08-19 NOTE — ED Provider Notes (Signed)
CSN: [id number]     Arrival date & time 08/19/14  1554 History   First MD Initiated Contact with Patient 08/19/14 1719     Chief Complaint  Patient presents with  . Ankle Pain     (Consider location/radiation/quality/duration/timing/severity/associated sxs/prior Treatment) Patient is a 54 y.o. female presenting with ankle pain. The history is provided by the patient and medical records.  Ankle Pain   This is a 54 year old female with past medical history significant for hypertension, anxiety, GERD, COPD, diabetes, presenting to the ED for right ankle pain. Patient states over the past several days she has developed swelling and redness to her right lateral malleolus. She states she had ORIF of right lateral ankle 2 years ago by Dr. Ophelia Charter for distal fibula fracture.  States since this time she has had intermittent issues with recurrent cellulitis. She states Dr. Ophelia Charter has told her if she continues getting infections, they would potentially need to remove her hardware. She endorses subjective fever and chills at home. She denies any open wounds or drainage from ankle.  Patient has known diabetic, she has not checked her blood sugar today. She does note her PCP has recently adjusted her diabetes medications.  Past Medical History  Diagnosis Date  . Hypertension   . Anxiety   . Gallstones   . GERD (gastroesophageal reflux disease)   . COPD (chronic obstructive pulmonary disease)   . Dysrhythmia     pt unsure of name of arrythmia - " my heart rate will drop all of a sudden" -no current treatment   . Headache(784.0)     migraines  . Rash     arms  . Arthritis   . Depression   . Diabetes mellitus without complication    Past Surgical History  Procedure Laterality Date  . Ectopic pregnancy surgery  many yrs ago  . Carpal tunnel release      bil  . Cholecystectomy  11/24/2011    Procedure: LAPAROSCOPIC CHOLECYSTECTOMY WITH INTRAOPERATIVE CHOLANGIOGRAM;  Surgeon: Clovis Pu. Cornett, MD;   Location: [facility];  Service: General;  Laterality: N/A;  Laparoscopic Cholecystectomy with Cholangiogram   Family History  Problem Relation Age of Onset  . Breast cancer Sister   . Lung cancer Father    History  Substance Use Topics  . Smoking status: Current Every Day Smoker -- 0.50 packs/day  . Smokeless tobacco: Never Used  . Alcohol Use: No   OB History    No data available     Review of Systems  Musculoskeletal: Positive for joint swelling and arthralgias.  All other systems reviewed and are negative.     Allergies  Clarithromycin; Sulfa antibiotics; Amoxicillin-pot clavulanate; Aspirin; Biaxin; Chlorthalidone; Dilaudid; Lasix; Lisinopril; and Latex  Home Medications   Prior to Admission medications   Medication Sig Start Date End Date Taking? Authorizing Provider  albuterol (PROVENTIL HFA;VENTOLIN HFA) 108 (90 BASE) MCG/ACT inhaler Inhale 2 puffs into the lungs every 4 (four) hours as needed for wheezing. Wheezing    Historical Provider, MD  ALPRAZolam [name]) 0.5 MG tablet Take 1 mg by mouth 2 (two) times daily. Anxiety    Historical Provider, MD  aspirin EC 81 MG tablet Take 81 mg by mouth daily.    Historical Provider, MD  benzonatate (TESSALON) 100 MG capsule Take 1 capsule (100 mg total) by mouth every 8 (eight) hours. 09/19/13   Phill Mutter Dammen, [name]  cephALEXin (KEFLEX) 500 MG capsule Take 1 capsule (500 mg total) by mouth 4 (four) times  daily. 06/17/14   Trixie Dredge, [name]  cetirizine (ZYRTEC) 10 MG tablet Take 10 mg by mouth daily as needed for allergies.     Historical Provider, MD  clindamycin (CLEOCIN) 150 MG capsule Take 150 mg by mouth 2 (two) times daily.    Historical Provider, MD  colesevelam (WELCHOL) 625 MG tablet Take 625 mg by mouth 2 (two) times daily with a meal.    Historical Provider, MD  FLUoxetine (PROZAC) 40 MG capsule Take 40 mg by mouth daily with breakfast.     Historical Provider, MD  HYDROcodone-acetaminophen (NORCO/VICODIN) 5-325 MG per  tablet Take 2 tablets by mouth every 4 (four) hours as needed. 09/30/13   Vida Roller, MD  hydrOXYzine (ATARAX/VISTARIL) 25 MG tablet Take 1 tablet (25 mg total) by mouth every 6 (six) hours as needed for itching. 06/17/14   Trixie Dredge, [name]  ibuprofen (ADVIL,MOTRIN) 800 MG tablet Take 1 tablet (800 mg total) by mouth 3 (three) times daily. 10/25/13   Glynn Octave, MD  imipramine (TOFRANIL) 25 MG tablet Take 50 mg by mouth at bedtime. Take 2 hours before bedtime    Historical Provider, MD  metFORMIN (GLUCOPHAGE) 500 MG tablet Take 500 mg by mouth 2 (two) times daily with a meal.     Historical Provider, MD  omeprazole (PRILOSEC) 20 MG capsule Take 20 mg by mouth 2 (two) times daily.     Historical Provider, MD  oxyCODONE-acetaminophen (PERCOCET/ROXICET) 5-325 MG per tablet Take 2 tablets by mouth every 4 (four) hours as needed for severe pain. 10/25/13   Glynn Octave, MD  predniSONE (DELTASONE) 50 MG tablet Take 1 tablet PO daily x 7 days 06/17/14   Trixie Dredge, [name]  sodium chloride (MURO 128) 5 % ophthalmic ointment Place 1 application into both eyes at bedtime.     Historical Provider, MD  valsartan (DIOVAN) 160 MG tablet Take 160 mg by mouth daily with breakfast.     Historical Provider, MD   BP 130/89 mmHg  Pulse 106  Temp(Src) 98.2 F (36.8 C)  Resp 18  SpO2 98%   Physical Exam  Constitutional: She is oriented to person, place, and time. She appears well-developed and well-nourished.  HENT:  Head: Normocephalic and atraumatic.  Mouth/Throat: Oropharynx is clear and moist.  Eyes: Conjunctivae and EOM are normal. Pupils are equal, round, and reactive to light.  Neck: Normal range of motion.  Cardiovascular: Normal rate, regular rhythm and normal heart sounds.   Pulmonary/Chest: Effort normal and breath sounds normal.  Abdominal: Soft. Bowel sounds are normal.  Musculoskeletal: Normal range of motion.  Right ankle with well-healed incision of lateral malleolus; there is localized  swelling, erythema, and induration of right lateral ankle approximately 4 inches in diameter; no streaking up the leg or into the foot; no fluid collections, abscess formation, or tissue crepitus; full range of motion of ankle maintained; normal sensation throughout foot; DP pulse intact; normal gait asymmetry, tenderness, or palpable cords  Neurological: She is alert and oriented to person, place, and time.  Skin: Skin is warm and dry.  Psychiatric: She has a normal mood and affect.  Nursing note and vitals reviewed.   ED Course  Procedures (including critical care time) Labs Review Labs Reviewed - No data to display  Imaging Review Dg Ankle Complete Right  08/19/2014   CLINICAL DATA:  Right ankle pain/ swelling, history of ankle surgery 2014  EXAM: RIGHT ANKLE - COMPLETE 3+ VIEW  COMPARISON:  09/19/2013  FINDINGS: No fracture or  dislocation is seen.  The ankle mortise is intact.  The base of the fifth metatarsal is unremarkable.  Lateral compression plate and screw fixation of distal fibula.  Mild soft tissue swelling overlying the lateral malleolus.  IMPRESSION: No evidence of fracture or dislocation.  Lateral compression plate and screw fixation of the distal fibula, without evidence of complication.   Electronically Signed   By: Charline Bills M.D.   On: 08/19/2014 17:20     EKG Interpretation None      MDM   Final diagnoses:  Cellulitis of right ankle   54 year old female with swelling and erythema of right ankle. She had ORIF of distal fibula 2 years ago by Dr. Ophelia Charter. States she's had issues with intermittent cellulitis since surgery. On exam, patient afebrile and nontoxic in appearance. She has approximately 4 inch erythema, induration, and warmth to touch surrounding prior surgical incision. There are no open wounds, drainage, or signs of abscess formation. There is no streaking up the leg or into foot. She has full range of motion of affected ankle. Presentation appears to be  skin cellulitis. Low suspicion for septic joint at this time. Patient will be started on antibiotics and referred back to orthopedics as she was told hardware may need to be removed if continued having issues with cellulitis.  CBG was checked here, 116.  Continue diabetes medications at home, monitoring CBG closely.  Rx clindamycin and percocet.  Discussed plan with patient, he/she acknowledged understanding and agreed with plan of care.  Return precautions given for new or worsening symptoms.  Garlon Hatchet, [name] 08/19/14 1935  Dione Booze, MD 08/20/14 [phone]

## 2014-08-19 NOTE — Discharge Instructions (Signed)
Take the prescribed medication as directed. Follow-up with Dr. Ophelia Charter-- call to schedule appt. Return to the ED for new or worsening symptoms.

## 2014-08-19 NOTE — ED Notes (Signed)
Pt c/o right ankle pain. Pt presents with redness and swelling to right lateral aspect of the malleolus. Site is tender to palpation. Pt had ankle surgery two years ago. Dr. Ophelia Charter is her orthopedic surgeon.

## 2014-08-19 NOTE — ED Notes (Signed)
CBG 116  

## 2014-09-07 ENCOUNTER — Encounter (HOSPITAL_COMMUNITY): Payer: Self-pay | Admitting: Family Medicine

## 2014-09-07 ENCOUNTER — Emergency Department (HOSPITAL_COMMUNITY)
Admission: EM | Admit: 2014-09-07 | Discharge: 2014-09-07 | Discharge status: Against Medical Advice | Payer: Medicaid Other | Attending: Emergency Medicine | Admitting: Emergency Medicine

## 2014-09-07 ENCOUNTER — Emergency Department (HOSPITAL_COMMUNITY): Payer: Medicaid Other

## 2014-09-07 DIAGNOSIS — E119 Type 2 diabetes mellitus without complications: Secondary | ICD-10-CM | POA: Insufficient documentation

## 2014-09-07 DIAGNOSIS — Z720 Tobacco use: Secondary | ICD-10-CM | POA: Diagnosis not present

## 2014-09-07 DIAGNOSIS — J449 Chronic obstructive pulmonary disease, unspecified: Secondary | ICD-10-CM | POA: Diagnosis not present

## 2014-09-07 DIAGNOSIS — R42 Dizziness and giddiness: Secondary | ICD-10-CM | POA: Diagnosis not present

## 2014-09-07 DIAGNOSIS — R05 Cough: Secondary | ICD-10-CM | POA: Diagnosis present

## 2014-09-07 DIAGNOSIS — I10 Essential (primary) hypertension: Secondary | ICD-10-CM | POA: Diagnosis not present

## 2014-09-07 DIAGNOSIS — R059 Cough, unspecified: Secondary | ICD-10-CM

## 2014-09-07 LAB — CBC WITH DIFFERENTIAL/PLATELET
BASOS PCT: 0 % (ref 0–1)
Basophils Absolute: 0 10*3/uL (ref 0.0–0.1)
EOS ABS: 0.3 10*3/uL (ref 0.0–0.7)
Eosinophils Relative: 3 % (ref 0–5)
HCT: 40.9 % (ref 36.0–46.0)
Hemoglobin: 13 g/dL (ref 12.0–15.0)
LYMPHS ABS: 2.9 10*3/uL (ref 0.7–4.0)
LYMPHS PCT: 29 % (ref 12–46)
MCH: 25 pg — ABNORMAL LOW (ref 26.0–34.0)
MCHC: 31.8 g/dL (ref 30.0–36.0)
MCV: 78.5 fL (ref 78.0–100.0)
Monocytes Absolute: 0.5 10*3/uL (ref 0.1–1.0)
Monocytes Relative: 5 % (ref 3–12)
NEUTROS PCT: 62 % (ref 43–77)
Neutro Abs: 6.3 10*3/uL (ref 1.7–7.7)
Platelets: 201 10*3/uL (ref 150–400)
RBC: 5.21 MIL/uL — ABNORMAL HIGH (ref 3.87–5.11)
RDW: 18 % — AB (ref 11.5–15.5)
WBC: 10.2 10*3/uL (ref 4.0–10.5)

## 2014-09-07 LAB — COMPREHENSIVE METABOLIC PANEL
ALT: 36 U/L — AB (ref 0–35)
AST: 36 U/L (ref 0–37)
Albumin: 3.5 g/dL (ref 3.5–5.2)
Alkaline Phosphatase: 118 U/L — ABNORMAL HIGH (ref 39–117)
Anion gap: 7 (ref 5–15)
BILIRUBIN TOTAL: 0.3 mg/dL (ref 0.3–1.2)
CALCIUM: 9 mg/dL (ref 8.4–10.5)
CO2: 24 mmol/L (ref 19–32)
Chloride: 108 mmol/L (ref 96–112)
Creatinine, Ser: 0.55 mg/dL (ref 0.50–1.10)
GFR calc Af Amer: >90 mL/min (ref >90)
GFR calc non Af Amer: >90 mL/min (ref >90)
Glucose, Bld: 178 mg/dL — ABNORMAL HIGH (ref 70–99)
POTASSIUM: 3.7 mmol/L (ref 3.5–5.1)
SODIUM: 139 mmol/L (ref 135–145)
Total Protein: 6.8 g/dL (ref 6.0–8.3)

## 2014-09-07 NOTE — ED Notes (Addendum)
Per pt sts sudden onset of dizziness that started 20 minutes ago. sts hx of the same with vertigo. sts worse with turning head and movement. sts also cough x 1 week. sts when she turns her head real quick it feels like something is flashing by.

## 2014-11-29 ENCOUNTER — Other Ambulatory Visit: Payer: Self-pay | Admitting: Otolaryngology

## 2014-11-29 DIAGNOSIS — R200 Anesthesia of skin: Secondary | ICD-10-CM

## 2014-11-29 DIAGNOSIS — R42 Dizziness and giddiness: Secondary | ICD-10-CM

## 2014-12-13 ENCOUNTER — Other Ambulatory Visit: Payer: Medicaid Other

## 2014-12-17 ENCOUNTER — Ambulatory Visit
Admission: RE | Admit: 2014-12-17 | Discharge: 2014-12-17 | Disposition: A | Discharge status: Home / Self Care | Payer: Medicaid Other | Source: Ambulatory Visit | Attending: Otolaryngology | Admitting: Otolaryngology

## 2014-12-17 DIAGNOSIS — R42 Dizziness and giddiness: Secondary | ICD-10-CM

## 2014-12-17 DIAGNOSIS — R200 Anesthesia of skin: Secondary | ICD-10-CM

## 2014-12-17 MED ORDER — GADOBENATE DIMEGLUMINE 529 MG/ML IV SOLN
15.0000 mL | Freq: Once | INTRAVENOUS | Status: AC | PRN
Start: 2014-12-17 — End: 2014-12-17
  Administered 2014-12-17: 15 mL via INTRAVENOUS

## 2015-04-09 ENCOUNTER — Encounter (HOSPITAL_COMMUNITY): Payer: Self-pay | Admitting: Emergency Medicine

## 2015-04-09 ENCOUNTER — Emergency Department (HOSPITAL_COMMUNITY): Payer: Medicaid Other

## 2015-04-09 DIAGNOSIS — J441 Chronic obstructive pulmonary disease with (acute) exacerbation: Secondary | ICD-10-CM | POA: Diagnosis not present

## 2015-04-09 DIAGNOSIS — F329 Major depressive disorder, single episode, unspecified: Secondary | ICD-10-CM | POA: Diagnosis not present

## 2015-04-09 DIAGNOSIS — M1990 Unspecified osteoarthritis, unspecified site: Secondary | ICD-10-CM | POA: Diagnosis not present

## 2015-04-09 DIAGNOSIS — E119 Type 2 diabetes mellitus without complications: Secondary | ICD-10-CM | POA: Diagnosis not present

## 2015-04-09 DIAGNOSIS — F419 Anxiety disorder, unspecified: Secondary | ICD-10-CM | POA: Insufficient documentation

## 2015-04-09 DIAGNOSIS — Z7982 Long term (current) use of aspirin: Secondary | ICD-10-CM | POA: Diagnosis not present

## 2015-04-09 DIAGNOSIS — K219 Gastro-esophageal reflux disease without esophagitis: Secondary | ICD-10-CM | POA: Diagnosis not present

## 2015-04-09 DIAGNOSIS — Z79899 Other long term (current) drug therapy: Secondary | ICD-10-CM | POA: Insufficient documentation

## 2015-04-09 DIAGNOSIS — Z880 Allergy status to penicillin: Secondary | ICD-10-CM | POA: Insufficient documentation

## 2015-04-09 DIAGNOSIS — L02415 Cutaneous abscess of right lower limb: Secondary | ICD-10-CM | POA: Diagnosis not present

## 2015-04-09 DIAGNOSIS — Z720 Tobacco use: Secondary | ICD-10-CM | POA: Diagnosis not present

## 2015-04-09 DIAGNOSIS — I10 Essential (primary) hypertension: Secondary | ICD-10-CM | POA: Insufficient documentation

## 2015-04-09 DIAGNOSIS — Z91040 Latex allergy status: Secondary | ICD-10-CM | POA: Diagnosis not present

## 2015-04-09 DIAGNOSIS — Z792 Long term (current) use of antibiotics: Secondary | ICD-10-CM | POA: Insufficient documentation

## 2015-04-09 DIAGNOSIS — R079 Chest pain, unspecified: Secondary | ICD-10-CM | POA: Insufficient documentation

## 2015-04-09 DIAGNOSIS — Z791 Long term (current) use of non-steroidal anti-inflammatories (NSAID): Secondary | ICD-10-CM | POA: Diagnosis not present

## 2015-04-09 LAB — CBC
HCT: 41.3 % (ref 36.0–46.0)
Hemoglobin: 12.9 g/dL (ref 12.0–15.0)
MCH: 24 pg — ABNORMAL LOW (ref 26.0–34.0)
MCHC: 31.2 g/dL (ref 30.0–36.0)
MCV: 76.8 fL — ABNORMAL LOW (ref 78.0–100.0)
PLATELETS: 216 10*3/uL (ref 150–400)
RBC: 5.38 MIL/uL — ABNORMAL HIGH (ref 3.87–5.11)
RDW: 18.8 % — AB (ref 11.5–15.5)
WBC: 9.5 10*3/uL (ref 4.0–10.5)

## 2015-04-09 LAB — BASIC METABOLIC PANEL
Anion gap: 11 (ref 5–15)
BUN: 7 mg/dL (ref 6–20)
CO2: 24 mmol/L (ref 22–32)
CREATININE: 0.6 mg/dL (ref 0.44–1.00)
Calcium: 9.1 mg/dL (ref 8.9–10.3)
Chloride: 102 mmol/L (ref 101–111)
GFR calc Af Amer: >60 mL/min (ref >60)
GLUCOSE: 149 mg/dL — AB (ref 65–99)
POTASSIUM: 3.8 mmol/L (ref 3.5–5.1)
SODIUM: 137 mmol/L (ref 135–145)

## 2015-04-09 LAB — I-STAT TROPONIN, ED: Troponin i, poc: 0.02 ng/mL (ref 0.00–0.08)

## 2015-04-09 NOTE — ED Notes (Signed)
C/o intermittent L sided chest pain that radiates to L shoulder and L arm x 2 days.  Also reports nausea, diarrhea, sob, and feeling clammy.

## 2015-04-10 ENCOUNTER — Emergency Department (HOSPITAL_COMMUNITY)
Admission: EM | Admit: 2015-04-10 | Discharge: 2015-04-10 | Disposition: A | Discharge status: Home / Self Care | Payer: Medicaid Other | Attending: Emergency Medicine | Admitting: Emergency Medicine

## 2015-04-10 DIAGNOSIS — R079 Chest pain, unspecified: Secondary | ICD-10-CM

## 2015-04-10 DIAGNOSIS — IMO0001 Reserved for inherently not codable concepts without codable children: Secondary | ICD-10-CM

## 2015-04-10 DIAGNOSIS — T814XXA Infection following a procedure, initial encounter: Secondary | ICD-10-CM

## 2015-04-10 LAB — I-STAT TROPONIN, ED: Troponin i, poc: 0.01 ng/mL (ref 0.00–0.08)

## 2015-04-10 MED ORDER — ASPIRIN 81 MG PO CHEW
324.0000 mg | CHEWABLE_TABLET | Freq: Once | ORAL | Status: AC
  Administered 2015-04-10: 324 mg via ORAL
  Filled 2015-04-10: qty 4

## 2015-04-10 MED ORDER — ONDANSETRON HCL 4 MG/2ML IJ SOLN
4.0000 mg | Freq: Once | INTRAMUSCULAR | Status: AC
  Administered 2015-04-10: 4 mg via INTRAVENOUS
  Filled 2015-04-10: qty 2

## 2015-04-10 MED ORDER — KETOROLAC TROMETHAMINE 30 MG/ML IJ SOLN
30.0000 mg | Freq: Once | INTRAMUSCULAR | Status: AC
  Administered 2015-04-10: 30 mg via INTRAVENOUS
  Filled 2015-04-10: qty 1

## 2015-04-10 MED ORDER — NAPROXEN 500 MG PO TABS: 500.0000 mg | ORAL_TABLET | Freq: Two times a day (BID) | ORAL | Status: DC

## 2015-04-10 MED ORDER — MORPHINE SULFATE (PF) 4 MG/ML IV SOLN
4.0000 mg | Freq: Once | INTRAVENOUS | Status: AC
  Administered 2015-04-10: 4 mg via INTRAVENOUS
  Filled 2015-04-10: qty 1

## 2015-04-10 MED ORDER — CEPHALEXIN 500 MG PO CAPS: 500.0000 mg | ORAL_CAPSULE | Freq: Three times a day (TID) | ORAL | Status: DC

## 2015-04-10 MED ORDER — HYDROCODONE-ACETAMINOPHEN 5-325 MG PO TABS: 2.0000 | ORAL_TABLET | ORAL | Status: DC | PRN

## 2015-04-10 NOTE — Discharge Instructions (Signed)
Chest Wall Pain Chest wall pain is pain in or around the bones and muscles of your chest. Sometimes, an injury causes this pain. Sometimes, the cause may not be known. This pain may take several weeks or longer to get better. HOME CARE Pay attention to any changes in your symptoms. Take these actions to help with your pain:  Rest as told by your doctor.  Avoid activities that cause pain. Try not to use your chest, belly (abdominal), or side muscles to lift heavy things.  If directed, apply ice to the painful area:  Put ice in a plastic bag.  Place a towel between your skin and the bag.  Leave the ice on for 20 minutes, 2-3 times per day.  Take over-the-counter and prescription medicines only as told by your doctor.  Do not use tobacco products, including cigarettes, chewing tobacco, and e-cigarettes. If you need help quitting, ask your doctor.  Keep all follow-up visits as told by your doctor. This is important. GET HELP IF:  You have a fever.  Your chest pain gets worse.  You have new symptoms. GET HELP RIGHT AWAY IF:  You feel sick to your stomach (nauseous) or you throw up (vomit).  You feel sweaty or light-headed.  You have a cough with phlegm (sputum) or you cough up blood.  You are short of breath.   This information is not intended to replace advice given to you by your health care provider. Make sure you discuss any questions you have with your health care provider.   Document Released: 12/01/2007 Document Revised: 03/05/2015 Document Reviewed: 09/09/2014 Elsevier Interactive Patient Education 2016 [employer]  Wound Infection A wound infection happens when a type of germ (bacteria) starts growing in the wound. In some cases, this can cause the wound to break open. If cared for properly, the infected wound will heal from the inside to the outside. Wound infections need treatment. CAUSES An infection is caused by bacteria growing in the wound.  SYMPTOMS    Increase in redness, swelling, or pain at the wound site.  Increase in drainage at the wound site.  Wound or bandage (dressing) starts to smell bad.  Fever.  Feeling tired or fatigued.  Pus draining from the wound. TREATMENT  Your health care provider will prescribe antibiotic medicine. The wound infection should improve within 24 to 48 hours. Any redness around the wound should stop spreading and the wound should be less painful.  HOME CARE INSTRUCTIONS   Only take over-the-counter or prescription medicines for pain, discomfort, or fever as directed by your health care provider.  Take your antibiotics as directed. Finish them even if you start to feel better.  Gently wash the area with mild soap and water 2 times a day, or as directed. Rinse off the soap. Pat the area dry with a clean towel. Do not rub the wound. This may cause bleeding.  Follow your health care provider's instructions for how often you need to change the dressing.  Apply ointment and a dressing to the wound as directed.  If the dressing sticks, moisten it with soapy water and gently remove it.  Change the bandage right away if it becomes wet, dirty, or develops a bad smell.  Take showers. Do not take tub baths, swim, or do anything that may soak the wound until it is healed.  Avoid exercises that make you sweat heavily.  Use anti-itch medicine as directed by your health care provider. The wound may itch when  it is healing. Do not pick or scratch at the wound.  Follow up with your health care provider to get your wound rechecked as directed. SEEK MEDICAL CARE IF:  You have an increase in swelling, pain, or redness around the wound.  You have an increase in the amount of pus coming from the wound.  There is a bad smell coming from the wound.  More of the wound breaks open.  You have a fever. MAKE SURE YOU:   Understand these instructions.  Will watch your condition.  Will get help right away  if you are not doing well or get worse.   This information is not intended to replace advice given to you by your health care provider. Make sure you discuss any questions you have with your health care provider.   Document Released: 03/13/2003 Document Revised: 06/19/2013 Document Reviewed: 12/02/2014 Elsevier Interactive Patient Education [employer]

## 2015-04-10 NOTE — ED Notes (Signed)
MD at bedside. 

## 2015-04-10 NOTE — ED Provider Notes (Signed)
CSN: [id number]     Arrival date & time 04/09/15  2032 History  By signing my name below, I, Doreatha Martin, attest that this documentation has been prepared under the direction and in the presence of Rolland Porter, MD. Electronically Signed: Doreatha Martin, ED Scribe. 04/10/2015. 12:30 AM.    Chief Complaint  Patient presents with  . Chest Pain   The history is provided by the patient. No language interpreter was used.    HPI Comments: [name] is a 54 y.o. female with Hx of HTN, GERD, COPD, DM, dysrhythmia, cardiac catheterization (4 years ago) who presents to the Emergency Department complaining of moderate, intermittent stabbing central CP that radiates to the left arm and shoulder onset a few days ago with associated left arm tingling, lightheadedness, SOB, cough. She states episodes were lasting a few seconds at a time and have worsened tonight to be minutes at a time. Pain is unaffected by breathing. She notes that she has had episodes all day but does not have any current pains. Pt states the current pain is different than previous pain with cardiac cath and notes with the previous episode she was experiencing palpitations. She was seen by cardiology and is not treated for the dysrhythmia- she does not remember the specific name. Pt is a current smoker. Pt takes metformin and glyptoside. FHx of CAD/CHF from father; had MI at 27, mechanical heart valve, lived until 96 and died of emphysema.  She also complains of an area of constant pain and swelling to the right lateral ankle at the site of a healed incision. Pt is taking 100mg  bid of Doxycycline for 2 months due to folliculitis. Pt states she has not had ASA in 2 weeks; has not had ASA today.   Past Medical History  Diagnosis Date  . Hypertension   . Anxiety   . Gallstones   . GERD (gastroesophageal reflux disease)   . COPD (chronic obstructive pulmonary disease) (HCC)   . Dysrhythmia     pt unsure of name of arrythmia - " my heart rate  will drop all of a sudden" -no current treatment   . Headache(784.0)     migraines  . Rash     arms  . Arthritis   . Depression   . Diabetes mellitus without complication [facility])    Past Surgical History  Procedure Laterality Date  . Ectopic pregnancy surgery  many yrs ago  . Carpal tunnel release      bil  . Cholecystectomy  11/24/2011    Procedure: LAPAROSCOPIC CHOLECYSTECTOMY WITH INTRAOPERATIVE CHOLANGIOGRAM;  Surgeon: Clovis Pu. Cornett, MD;  Location: [facility];  Service: General;  Laterality: N/A;  Laparoscopic Cholecystectomy with Cholangiogram   Family History  Problem Relation Age of Onset  . Breast cancer Sister   . Lung cancer Father    Social History  Substance Use Topics  . Smoking status: Current Every Day Smoker -- 0.50 packs/day  . Smokeless tobacco: Never Used  . Alcohol Use: No   OB History    No data available     Review of Systems  Constitutional: Negative for fever, chills, diaphoresis, appetite change and fatigue.  HENT: Negative for mouth sores, sore throat and trouble swallowing.   Eyes: Negative for visual disturbance.  Respiratory: Positive for cough and shortness of breath. Negative for chest tightness and wheezing.   Cardiovascular: Positive for chest pain.  Gastrointestinal: Negative for nausea, vomiting, abdominal pain, diarrhea and abdominal distention.  Endocrine: Negative for polydipsia,  polyphagia and polyuria.  Genitourinary: Negative for dysuria, frequency and hematuria.  Musculoskeletal: Negative for gait problem.  Skin: Negative for color change, pallor and rash.       +area of pain, redness and swelling at the site of a healed incision on the right lateral malleolus   Neurological: Positive for light-headedness. Negative for dizziness, syncope and headaches.       +left arm tingling  Hematological: Does not bruise/bleed easily.  Psychiatric/Behavioral: Negative for behavioral problems and confusion.   Allergies  Biaxin;  Clarithromycin; Sulfa antibiotics; Amoxicillin-pot clavulanate; Aspirin; Chlorthalidone; Cholestyramine; Dilaudid; Lasix; Lisinopril; and Latex  Home Medications   Prior to Admission medications   Medication Sig Start Date End Date Taking? Authorizing Provider  albuterol (PROVENTIL HFA;VENTOLIN HFA) 108 (90 BASE) MCG/ACT inhaler Inhale 2 puffs into the lungs every 4 (four) hours as needed for wheezing. Wheezing   Yes Historical Provider, MD  ALPRAZolam (XANAX) 0.5 MG tablet Take 1 mg by mouth 2 (two) times daily. Anxiety   Yes Historical Provider, MD  aspirin EC 81 MG tablet Take 81 mg by mouth daily.   Yes Historical Provider, MD  doxycycline (VIBRA-TABS) 100 MG tablet Take 100 mg by mouth 2 (two) times daily.   Yes Historical Provider, MD  FLUoxetine (PROZAC) 40 MG capsule Take 40 mg by mouth daily with breakfast.    Yes Historical Provider, MD  glimepiride (AMARYL) 2 MG tablet Take 2 mg by mouth daily with breakfast.   Yes Historical Provider, MD  hydrOXYzine (ATARAX/VISTARIL) 25 MG tablet Take 1 tablet (25 mg total) by mouth every 6 (six) hours as needed for itching. 06/17/14  Yes Trixie Dredge, [name]  ibuprofen (ADVIL,MOTRIN) 800 MG tablet Take 1 tablet (800 mg total) by mouth 3 (three) times daily. 10/25/13  Yes Glynn Octave, MD  imipramine (TOFRANIL) 25 MG tablet Take 50 mg by mouth at bedtime. Take 2 hours before bedtime   Yes Historical Provider, MD  loperamide (IMODIUM) 2 MG capsule Take 2-4 mg by mouth 4 (four) times daily as needed for diarrhea or loose stools.   Yes Historical Provider, MD  metFORMIN (GLUCOPHAGE) 1000 MG tablet Take 1,000 mg by mouth daily with breakfast.   Yes Historical Provider, MD  omeprazole (PRILOSEC) 20 MG capsule Take 20 mg by mouth 2 (two) times daily.    Yes Historical Provider, MD  oxyCODONE-acetaminophen (PERCOCET/ROXICET) 5-325 MG per tablet Take 1 tablet by mouth every 4 (four) hours as needed. 08/19/14  Yes Garlon Hatchet, [name]  pravastatin (PRAVACHOL) 40  MG tablet Take 40 mg by mouth daily.   Yes Historical Provider, MD  sodium chloride (MURO 128) 5 % ophthalmic ointment Place 1 application into both eyes at bedtime.    Yes Historical Provider, MD  triamcinolone (KENALOG) 0.025 % cream Apply 1 application topically 2 (two) times daily.   Yes Historical Provider, MD  valsartan (DIOVAN) 160 MG tablet Take 160 mg by mouth daily with breakfast.    Yes Historical Provider, MD  benzonatate (TESSALON) 100 MG capsule Take 1 capsule (100 mg total) by mouth every 8 (eight) hours. Patient not taking: Reported on 04/10/2015 09/19/13   Ivonne Andrew, [name]  cephALEXin (KEFLEX) 500 MG capsule Take 1 capsule (500 mg total) by mouth 3 (three) times daily. 04/10/15   Rolland Porter, MD  clindamycin (CLEOCIN) 150 MG capsule Take 2 capsules (300 mg total) by mouth 3 (three) times daily. May dispense as 150mg  capsules Patient not taking: Reported on 04/10/2015 08/19/14   Rosezella Florida  Allyne Gee, [name]  HYDROcodone-acetaminophen (NORCO/VICODIN) 5-325 MG tablet Take 2 tablets by mouth every 4 (four) hours as needed. 04/10/15   Rolland Porter, MD  naproxen (NAPROSYN) 500 MG tablet Take 1 tablet (500 mg total) by mouth 2 (two) times daily. 04/10/15   Rolland Porter, MD  predniSONE (DELTASONE) 50 MG tablet Take 1 tablet PO daily x 7 days Patient not taking: Reported on 04/10/2015 06/17/14   Trixie Dredge, [name]   BP 143/91 mmHg  Pulse 93  Temp(Src) 98.1 F (36.7 C) (Oral)  Resp 20  Ht  (1.727 m)  Wt 172 lb 2 oz (78.075 kg)  BMI 26.18 kg/m2  SpO2 99%  LMP 07/24/2011 Physical Exam  Constitutional: She is oriented to person, place, and time. She appears well-developed and well-nourished. No distress.  HENT:  Head: Normocephalic.  Eyes: Conjunctivae are normal. Pupils are equal, round, and reactive to light. No scleral icterus.  Neck: Normal range of motion. Neck supple. No thyromegaly present.  Cardiovascular: Normal rate and regular rhythm.  Exam reveals no gallop and no friction rub.    No murmur heard. Pulmonary/Chest: Effort normal and breath sounds normal. No respiratory distress. She has no wheezes. She has no rales.  Abdominal: Soft. Bowel sounds are normal. She exhibits no distension. There is no tenderness. There is no rebound.  Musculoskeletal: Normal range of motion.  Neurological: She is alert and oriented to person, place, and time.  Skin: Skin is warm and dry. No rash noted. There is erythema.  Healed incision over the right lateral malleolus with an area of surrounding erythema.   Psychiatric: She has a normal mood and affect. Her behavior is normal.  Nursing note and vitals reviewed.  ED Course  Procedures (including critical care time) DIAGNOSTIC STUDIES: Oxygen Saturation is 96% on RA, normal by my interpretation.    COORDINATION OF CARE: 12:27 AM Discussed treatment plan with pt at bedside and pt agreed to plan.   Labs Review Labs Reviewed  BASIC METABOLIC PANEL - Abnormal; Notable for the following:    Glucose, Bld 149 (*)    All other components within normal limits  CBC - Abnormal; Notable for the following:    RBC 5.38 (*)    MCV 76.8 (*)    MCH 24.0 (*)    RDW 18.8 (*)    All other components within normal limits  I-STAT TROPOININ, ED  Rosezena Sensor, ED    Imaging Review Dg Chest 2 View  04/09/2015  CLINICAL DATA:  Left-sided chest and left shoulder pain tonight. Shortness of breath. EXAM: CHEST  2 VIEW COMPARISON:  09/07/2014 FINDINGS: The heart size and mediastinal contours are within normal limits. Both lungs are clear. The visualized skeletal structures are unremarkable. Surgical clips in the right upper quadrant. IMPRESSION: No active cardiopulmonary disease. Electronically Signed   By: Burman Nieves M.D.   On: 04/09/2015 21:12   I have personally reviewed and evaluated these images and lab results as part of my medical decision-making.   EKG Interpretation   Date/Time:  Wednesday April 09 2015 20:39:19 EDT Ventricular  Rate:  100 PR Interval:  144 QRS Duration: 128 QT Interval:  394 QTC Calculation: 508 R Axis:   0 Text Interpretation:  Normal sinus rhythm Non-specific intra-ventricular  conduction block Abnormal ECG Confirmed by Fayrene Fearing  MD, Quanell Loughney ([id number]) on  04/10/2015 12:03:47 AM      MDM   Final diagnoses:  Chest pain, unspecified chest pain type  Incisional abscess, initial encounter  Patient with left bundle-branch block. Her pain is intermittent and quite atypical. Is been present majority of the last 36 hours. No elevation of troponin initially or on repeat. No abdomen is on chest x-ray. With intermittent pain do not think her left bundle-branch block represents an acute coronary syndrome. Her pain is atypical. No clinical risk for PE. No pneumonia. No findings to suggest pericarditis. Plan will be symptomatically treatment Vicodin, naproxen, primary care follow-up.  Rolland Porter, MD 04/10/15 [phone]

## 2015-05-14 DIAGNOSIS — B351 Tinea unguium: Secondary | ICD-10-CM | POA: Insufficient documentation

## 2015-06-02 DIAGNOSIS — I10 Essential (primary) hypertension: Secondary | ICD-10-CM | POA: Insufficient documentation

## 2015-06-02 DIAGNOSIS — R21 Rash and other nonspecific skin eruption: Secondary | ICD-10-CM | POA: Insufficient documentation

## 2015-06-02 DIAGNOSIS — I499 Cardiac arrhythmia, unspecified: Secondary | ICD-10-CM | POA: Insufficient documentation

## 2015-06-02 DIAGNOSIS — Z794 Long term (current) use of insulin: Secondary | ICD-10-CM | POA: Insufficient documentation

## 2015-06-02 DIAGNOSIS — E119 Type 2 diabetes mellitus without complications: Secondary | ICD-10-CM | POA: Insufficient documentation

## 2015-06-02 DIAGNOSIS — M1990 Unspecified osteoarthritis, unspecified site: Secondary | ICD-10-CM | POA: Insufficient documentation

## 2015-06-02 DIAGNOSIS — F329 Major depressive disorder, single episode, unspecified: Secondary | ICD-10-CM | POA: Insufficient documentation

## 2015-06-02 DIAGNOSIS — F419 Anxiety disorder, unspecified: Secondary | ICD-10-CM | POA: Insufficient documentation

## 2015-06-02 DIAGNOSIS — K8020 Calculus of gallbladder without cholecystitis without obstruction: Secondary | ICD-10-CM | POA: Insufficient documentation

## 2015-06-02 DIAGNOSIS — K219 Gastro-esophageal reflux disease without esophagitis: Secondary | ICD-10-CM | POA: Insufficient documentation

## 2015-06-02 DIAGNOSIS — F32A Depression, unspecified: Secondary | ICD-10-CM | POA: Insufficient documentation

## 2015-06-02 DIAGNOSIS — J449 Chronic obstructive pulmonary disease, unspecified: Secondary | ICD-10-CM | POA: Insufficient documentation

## 2015-06-03 ENCOUNTER — Encounter: Payer: Self-pay | Admitting: Cardiovascular Disease

## 2015-06-03 ENCOUNTER — Ambulatory Visit (INDEPENDENT_AMBULATORY_CARE_PROVIDER_SITE_OTHER): Payer: Medicaid Other | Admitting: Cardiovascular Disease

## 2015-06-03 ENCOUNTER — Encounter (INDEPENDENT_AMBULATORY_CARE_PROVIDER_SITE_OTHER): Payer: Self-pay

## 2015-06-03 VITALS — BP 100/82 | HR 90 | Ht 67.5 in | Wt 173.0 lb

## 2015-06-03 DIAGNOSIS — I447 Left bundle-branch block, unspecified: Secondary | ICD-10-CM | POA: Diagnosis not present

## 2015-06-03 DIAGNOSIS — R0789 Other chest pain: Secondary | ICD-10-CM

## 2015-06-03 DIAGNOSIS — Z13220 Encounter for screening for lipoid disorders: Secondary | ICD-10-CM

## 2015-06-03 DIAGNOSIS — R079 Chest pain, unspecified: Secondary | ICD-10-CM | POA: Insufficient documentation

## 2015-06-03 DIAGNOSIS — I5021 Acute systolic (congestive) heart failure: Secondary | ICD-10-CM

## 2015-06-03 DIAGNOSIS — I10 Essential (primary) hypertension: Secondary | ICD-10-CM | POA: Diagnosis not present

## 2015-06-03 LAB — COMPREHENSIVE METABOLIC PANEL
ALK PHOS: 119 U/L (ref 33–130)
ALT: 34 U/L — ABNORMAL HIGH (ref 6–29)
AST: 26 U/L (ref 10–35)
Albumin: 3.7 g/dL (ref 3.6–5.1)
BUN: 11 mg/dL (ref 7–25)
CALCIUM: 9.1 mg/dL (ref 8.6–10.4)
CHLORIDE: 102 mmol/L (ref 98–110)
CO2: 26 mmol/L (ref 20–31)
Creat: 0.64 mg/dL (ref 0.50–1.05)
Glucose, Bld: 132 mg/dL — ABNORMAL HIGH (ref 65–99)
POTASSIUM: 3.9 mmol/L (ref 3.5–5.3)
Sodium: 138 mmol/L (ref 135–146)
TOTAL PROTEIN: 6.7 g/dL (ref 6.1–8.1)
Total Bilirubin: 0.4 mg/dL (ref 0.2–1.2)

## 2015-06-03 LAB — LIPID PANEL
CHOLESTEROL: 140 mg/dL (ref 125–200)
HDL: 28 mg/dL — AB (ref >=46)
LDL CALC: 83 mg/dL (ref <130)
TRIGLYCERIDES: 147 mg/dL (ref <150)
Total CHOL/HDL Ratio: 5 Ratio (ref <=5.0)
VLDL: 29 mg/dL (ref <30)

## 2015-06-03 NOTE — Patient Instructions (Signed)
Medication Instructions:  Your physician recommends that you continue on your current medications as directed. Please refer to the Current Medication list given to you today.   Labwork: TODAY - cholesterol, liver, basic metabolic panel   Testing/Procedures: Your physician has requested that you have an echocardiogram. Echocardiography is a painless test that uses sound waves to create images of your heart. It provides your doctor with information about the size and shape of your heart and how well your heart's chambers and valves are working. This procedure takes approximately one hour. There are no restrictions for this procedure.  Your physician has requested that you have a lexiscan myoview. For further information please visit [email or web address] Please follow instruction sheet, as given.   Follow-Up: Your physician recommends that you schedule a follow-up appointment in: 3 months with Dr. Elease Hashimoto.    If you need a refill on your cardiac medications before your next appointment, please call your pharmacy.   Thank you for choosing [facility]! Eligha Bridegroom, RN [phone]

## 2015-06-03 NOTE — Progress Notes (Addendum)
Cardiology Office Note   Date:  06/03/2015   ID:  [name], DOB 12-Dec-1960, MRN [id number]  PCP:  Lupe Carney, MD  Cardiologist:   Elease Hashimoto Deloris Ping, MD   Chief Complaint  Patient presents with  . Dizziness      History of Present Illness: Dec. 6, 2016:  [name] is a 54 y.o. female who presents for left arm tingling, heaviness Associated with left chest pressure - pushing sensation  Lasts from 2-4 minutes.   Occurs spontansously .  - at rest and exertion . Walks on occasion - not necessarily associated with walking  Have been going in since Oct.   Went to the ER on Oct. 12.   Saw [facility] in 2010, Wore a monitor - showed some fluttering - she does not recall what it reported.  Had a cath in 2010 for similar issues.    Results and images are not found in epic.    Smokes - 5 cigarettes a day . No ETOH Father had CABG at age 67   Past Medical History  Diagnosis Date  . Hypertension   . Anxiety   . Gallstones   . GERD (gastroesophageal reflux disease)   . COPD (chronic obstructive pulmonary disease) (HCC)   . Dysrhythmia     pt unsure of name of arrythmia - " my heart rate will drop all of a sudden" -no current treatment   . Headache(784.0)     migraines  . Rash     arms  . Arthritis   . Depression   . Diabetes mellitus without complication [facility])     Past Surgical History  Procedure Laterality Date  . Ectopic pregnancy surgery  many yrs ago  . Carpal tunnel release      bil  . Cholecystectomy  11/24/2011    Procedure: LAPAROSCOPIC CHOLECYSTECTOMY WITH INTRAOPERATIVE CHOLANGIOGRAM;  Surgeon: Clovis Pu. Cornett, MD;  Location: [facility];  Service: General;  Laterality: N/A;  Laparoscopic Cholecystectomy with Cholangiogram     Current Outpatient Prescriptions  Medication Sig Dispense Refill  . albuterol (PROVENTIL HFA;VENTOLIN HFA) 108 (90 BASE) MCG/ACT inhaler Inhale 2 puffs into the lungs every 4 (four) hours as needed for wheezing.  Wheezing    . ALPRAZolam (XANAX) 0.5 MG tablet Take 1 mg by mouth 2 (two) times daily. Anxiety    . aspirin EC 81 MG tablet Take 81 mg by mouth daily.    Marland Kitchen doxycycline (VIBRA-TABS) 100 MG tablet Take 100 mg by mouth 2 (two) times daily.    Marland Kitchen FLUoxetine (PROZAC) 40 MG capsule Take 40 mg by mouth daily with breakfast.     . glimepiride (AMARYL) 2 MG tablet Take 2 mg by mouth daily with breakfast.    . hydrOXYzine (ATARAX/VISTARIL) 25 MG tablet Take 1 tablet (25 mg total) by mouth every 6 (six) hours as needed for itching. 15 tablet 0  . ibuprofen (ADVIL,MOTRIN) 800 MG tablet Take 1 tablet (800 mg total) by mouth 3 (three) times daily. 21 tablet 0  . imipramine (TOFRANIL) 25 MG tablet Take 50 mg by mouth at bedtime. Take 2 hours before bedtime    . loperamide (IMODIUM) 2 MG capsule Take 2-4 mg by mouth 4 (four) times daily as needed for diarrhea or loose stools.    . metFORMIN (GLUCOPHAGE) 1000 MG tablet Take 1,000 mg by mouth daily with breakfast.    . omeprazole (PRILOSEC) 20 MG capsule Take 20 mg by mouth 2 (two) times daily.     Marland Kitchen  oxyCODONE-acetaminophen (PERCOCET/ROXICET) 5-325 MG per tablet Take 1 tablet by mouth every 4 (four) hours as needed. 20 tablet 0  . pravastatin (PRAVACHOL) 40 MG tablet Take 40 mg by mouth daily.    . sodium chloride (MURO 128) 5 % ophthalmic ointment Place 1 application into both eyes at bedtime.     . triamcinolone (KENALOG) 0.025 % cream Apply 1 application topically 2 (two) times daily.    . valsartan (DIOVAN) 160 MG tablet Take 160 mg by mouth daily with breakfast.      No current facility-administered medications for this visit.    Allergies:   Biaxin; Clarithromycin; Sulfa antibiotics; Amoxicillin-pot clavulanate; Aspirin; Chlorthalidone; Cholestyramine; Dilaudid; Lasix; Lisinopril; and Latex    Social History:  The patient  reports that she has been smoking.  She has never used smokeless tobacco. She reports that she does not drink alcohol or use illicit  drugs.   Family History:  The patient's family history includes Breast cancer in her sister; Lung cancer in her father.    ROS:  Please see the history of present illness.    Review of Systems: Constitutional:  denies fever, chills, diaphoresis, appetite change and fatigue.  HEENT: denies photophobia, eye pain, redness, hearing loss, ear pain, congestion, sore throat, rhinorrhea, sneezing, neck pain, neck stiffness and tinnitus.  Respiratory: denies SOB, DOE, cough, chest tightness, and wheezing.  Cardiovascular: admits to chest pain, palpitations , denies  leg swelling.  Gastrointestinal: denies nausea, vomiting, abdominal pain, diarrhea, constipation, blood in stool.  Genitourinary: denies dysuria, urgency, frequency, hematuria, flank pain and difficulty urinating.  Musculoskeletal: denies  myalgias, back pain, joint swelling, arthralgias and gait problem.   Skin: denies pallor, rash and wound.  Neurological: denies dizziness, seizures, syncope, weakness, light-headedness, numbness and headaches.   Hematological: denies adenopathy, easy bruising, personal or family bleeding history.  Psychiatric/ Behavioral: denies suicidal ideation, mood changes, confusion, nervousness, sleep disturbance and agitation.       All other systems are reviewed and negative.    PHYSICAL EXAM: VS:  BP 100/82 mmHg  Pulse 90  Ht 5' 7.5" (1.715 m)  Wt 173 lb (78.472 kg)  BMI 26.68 kg/m2  LMP 07/24/2011 , BMI Body mass index is 26.68 kg/(m^2). GEN: Well nourished, well developed, in no acute distress HEENT: normal Neck: no JVD, carotid bruits, or masses Cardiac: RRR; no murmurs, rubs, or gallops,no edema  Respiratory:  clear to auscultation bilaterally, normal work of breathing GI: soft, nontender, nondistended, + BS MS: no deformity or atrophy Skin: warm and dry, no rash Neuro:  Strength and sensation are intact Psych: normal   EKG:  EKG is ordered today. The ekg ordered today demonstrates NSR  at 90.  LBBB  - the LBBB is new since her ECG in 2013.    Recent Labs: 09/07/2014: ALT 36* 04/09/2015: BUN 7; Creatinine, Ser 0.60; Hemoglobin 12.9; Platelets 216; Potassium 3.8; Sodium 137    Lipid Panel No results found for: CHOL, TRIG, HDL, CHOLHDL, VLDL, LDLCALC, LDLDIRECT    Wt Readings from Last 3 Encounters:  06/03/15 173 lb (78.472 kg)  04/09/15 172 lb 2 oz (78.075 kg)  10/25/13 162 lb (73.483 kg)      Other studies Reviewed: Additional studies/ records that were reviewed today include: . Review of the above records demonstrates:    ASSESSMENT AND PLAN:  1.  Chest pain ;  [name]  presents with some episodes of chest discomfort. These are described as a chest tightness with radiation to the left arm.  Also associated with some left arm tingling and burning. She has a long history of cigarette smoking. She has known moderate coronary artery disease by cardiac catheterization in 2010. She also has a new left bundle-branch block since 2013.  We'll schedule her for a [employer] study.  We will also get an echocardiogram to make sure that she doesn't have significant LV dysfunction .  2. Palpitations:  She has occasional palpitations. She's worn a monitor the past but it did not reveal any significant arrhythmias. We will continue to follow her for now. I've encouraged her to stop smoking which might help with these palpitations.   Current medicines are reviewed at length with the patient today.  The patient does not have concerns regarding medicines.  The following changes have been made:  no change  Labs/ tests ordered today include:  No orders of the defined types were placed in this encounter.     Disposition:   FU with  Me in 3 months    [name], Deloris Ping, MD  06/03/2015 8:30 AM    [facility] [address] [place], [place], [place]  [address] Phone: [phone]; Fax: [phone]   Addendum  The echo and myoview show significant LV  dysfunction and a possible previous anterior wall MI. Previous cath in 2012 was unremarkable.  She will need a right and left heart cath. I have spoken to patient.  We have discussed risks, benefits, options. She understands and agrees to proceed.    Rayquon Uselman, Deloris Ping, MD  06/20/2015 11:37 AM    [facility] [address] [place],  [address] [place], [place]  [address] Pager [phone] Phone: [phone]; Fax: [phone]

## 2015-06-06 ENCOUNTER — Other Ambulatory Visit (HOSPITAL_COMMUNITY): Payer: Self-pay | Admitting: Orthopaedic Surgery

## 2015-06-10 ENCOUNTER — Encounter (HOSPITAL_COMMUNITY): Payer: Self-pay | Admitting: Vascular Surgery

## 2015-06-10 ENCOUNTER — Other Ambulatory Visit (HOSPITAL_COMMUNITY): Payer: Self-pay | Admitting: Orthopaedic Surgery

## 2015-06-10 MED ORDER — CEFAZOLIN SODIUM-DEXTROSE 2-3 GM-% IV SOLR: 2.0000 g | INTRAVENOUS | Status: AC

## 2015-06-10 NOTE — Progress Notes (Signed)
Anesthesia Chart Review: SAME DAY WORK-UP.  Patient is a 54 year old female scheduled for right lateral malleolus hardware removal on 06/11/15 by Dr. Ophelia Charter. Dx: Right fibula infection. Patient was seen by Dr. Ophelia Charter on 06/06/15 and am told by his office he posted case on an urgent basis due to purulent discharge from her ankle wound.  History includes smoking, HTN, DM2, dysrhythmias (episodes of SVT and asymptomatic bradycardia with rates in the 30's during sleep during 01/2009 hospitalization), anxiety, depression, GERD, COPD, migraines, cholecystectomy '13, ectopic pregnancy surgery. PCP is Dr. Lupe Carney.  Patient was seen by cardiologist Dr. Kristeen Miss on 06/03/15 for evaluation of chest pain since 03/2015. Had ED visit 04/10/15 and ruled out for acute MI. By notes, she reported chest pain as left arm tingling, heaviness associated with left chest pressure-pushing sensation lasting 2-4 minutes at rest and exertion. It has been going on since 03/2015. Her father had a CABG at age 68. Cath in 2010 showed no significant CAD. Has developed a new left BBB since then. Dr. Elease Hashimoto recommended a stress test and echo which are currently scheduled for 06/18/15.    Meds include albuterol, Xanax, ASA 81 mg, doxycycline, Prozac, glimepiride, hydroxyzine, imipramine, metformin, omeprazole, Percocet, pravastatin, valsartan.   06/03/15 EKG: NSR, LAD, left BBB. Left BBB new since 2015.  02/14/09 [facility] (Dr. Eldridge Dace): FINDINGS: The left main was a short vessel and widely patent. The left circumflex was a small vessel with mild irregularities and a proximal vessel. The OM-1 was a medium-sized vessel.Left anterior descending was a large vessel proximally with mildirregularities. There was a large first diagonal. The distal LAD was asmall vessel but widely patent.The right coronary artery was a large dominant vessel and appearedangiographically normal. The PLA and PDA were widely patent.Left ventriculogram  showed normal left ventricular function withejection fraction of 55-60%. HEMODYNAMICS: Left ventricular pressure 149/5 with an LVEDP of 11 mmHg. Aortic pressure 147/85 with a mean aortic pressure of 111 mmHg. The abdominal aortogram showed no abdominal aortic aneurysm. Bilateral renal arteries were widely patent. IMPRESSION: 1. No significant coronary artery disease. 2. Normal left ventricular function. 3. No abdominal aortic aneurysm. RECOMMENDATIONS: I recommend smoking cessation along with other aggressive primary prevention.  02/04/09 Echo: LVEF 50-55%, grade 1 diastolic dysfunction, mild MR/AR.  04/09/15 CXR: IMPRESSION: No active cardiopulmonary disease.  CMET, lipid profile noted from 06/03/15. Other labs are > 13 days old.  Discussed with anesthesiologist Dr. Michelle Piper. Recommend input from cardiology regarding whether testing could be done pre-operatively. I have notified Elnita Maxwell at Dr. Ophelia Charter' office. She discussed with Dr. Ophelia Charter. He feels if patient stays on her doxycycline, she can proceed with cardiac testing next week and will plan surgery following testing if results are acceptable. If abnormal, then we will need Dr. Elease Hashimoto to weigh in. Elnita Maxwell will let the patient and OR know.   Velna Ochs [facility] Short Stay Center/Anesthesiology Phone [phone] 06/10/2015 10:14 AM

## 2015-06-13 ENCOUNTER — Ambulatory Visit (HOSPITAL_COMMUNITY): Payer: Medicaid Other

## 2015-06-16 ENCOUNTER — Telehealth (HOSPITAL_COMMUNITY): Payer: Self-pay | Admitting: *Deleted

## 2015-06-16 NOTE — Telephone Encounter (Signed)
Left message on voicemail per DPR in reference to upcoming appointment scheduled on 06/18/15 at 745 with detailed instructions given per Myocardial Perfusion Study Information Sheet for the test. LM to arrive 15 minutes early, and that it is imperative to arrive on time for appointment to keep from having the test rescheduled. If you need to cancel or reschedule your appointment, please call the office within 24 hours of your appointment. Failure to do so may result in a cancellation of your appointment, and a $50 no show fee. Phone number given for call back for any questions.  Antionette Char, RN

## 2015-06-18 ENCOUNTER — Ambulatory Visit (HOSPITAL_COMMUNITY): Payer: Medicaid Other | Attending: Cardiology

## 2015-06-18 ENCOUNTER — Encounter (HOSPITAL_COMMUNITY): Payer: Medicaid Other

## 2015-06-18 ENCOUNTER — Other Ambulatory Visit: Payer: Self-pay

## 2015-06-18 DIAGNOSIS — R0789 Other chest pain: Secondary | ICD-10-CM | POA: Diagnosis not present

## 2015-06-18 DIAGNOSIS — I447 Left bundle-branch block, unspecified: Secondary | ICD-10-CM | POA: Diagnosis not present

## 2015-06-19 ENCOUNTER — Ambulatory Visit (HOSPITAL_COMMUNITY): Payer: Medicaid Other | Attending: Cardiovascular Disease

## 2015-06-19 DIAGNOSIS — I517 Cardiomegaly: Secondary | ICD-10-CM | POA: Diagnosis not present

## 2015-06-19 DIAGNOSIS — I447 Left bundle-branch block, unspecified: Secondary | ICD-10-CM | POA: Diagnosis not present

## 2015-06-19 DIAGNOSIS — I10 Essential (primary) hypertension: Secondary | ICD-10-CM | POA: Insufficient documentation

## 2015-06-19 DIAGNOSIS — E119 Type 2 diabetes mellitus without complications: Secondary | ICD-10-CM | POA: Diagnosis not present

## 2015-06-19 DIAGNOSIS — R0789 Other chest pain: Secondary | ICD-10-CM | POA: Insufficient documentation

## 2015-06-19 DIAGNOSIS — Z8249 Family history of ischemic heart disease and other diseases of the circulatory system: Secondary | ICD-10-CM | POA: Insufficient documentation

## 2015-06-19 DIAGNOSIS — R9439 Abnormal result of other cardiovascular function study: Secondary | ICD-10-CM | POA: Diagnosis not present

## 2015-06-19 DIAGNOSIS — R002 Palpitations: Secondary | ICD-10-CM | POA: Diagnosis not present

## 2015-06-19 DIAGNOSIS — R42 Dizziness and giddiness: Secondary | ICD-10-CM | POA: Diagnosis not present

## 2015-06-19 LAB — MYOCARDIAL PERFUSION IMAGING
CHL CUP NUCLEAR SRS: 3
CHL CUP NUCLEAR SSS: 10
CSEPPHR: 109 {beats}/min
LHR: 0.28
LV dias vol: 191 mL
LVSYSVOL: 140 mL
Rest HR: 86 {beats}/min
SDS: 7
TID: 0.91

## 2015-06-19 MED ORDER — TECHNETIUM TC 99M SESTAMIBI GENERIC - CARDIOLITE
11.0000 | Freq: Once | INTRAVENOUS | Status: AC | PRN
  Administered 2015-06-19: 11 via INTRAVENOUS

## 2015-06-19 MED ORDER — REGADENOSON 0.4 MG/5ML IV SOLN
0.4000 mg | Freq: Once | INTRAVENOUS | Status: AC
  Administered 2015-06-19: 0.4 mg via INTRAVENOUS

## 2015-06-19 MED ORDER — TECHNETIUM TC 99M SESTAMIBI GENERIC - CARDIOLITE
32.7000 | Freq: Once | INTRAVENOUS | Status: AC | PRN
  Administered 2015-06-19: 32.7 via INTRAVENOUS

## 2015-06-20 ENCOUNTER — Encounter: Payer: Self-pay | Admitting: Nurse Practitioner

## 2015-06-20 ENCOUNTER — Telehealth: Payer: Self-pay | Admitting: Cardiovascular Disease

## 2015-06-20 ENCOUNTER — Other Ambulatory Visit (INDEPENDENT_AMBULATORY_CARE_PROVIDER_SITE_OTHER): Payer: Medicaid Other | Admitting: *Deleted

## 2015-06-20 DIAGNOSIS — I519 Heart disease, unspecified: Secondary | ICD-10-CM | POA: Diagnosis not present

## 2015-06-20 LAB — CBC WITH DIFFERENTIAL/PLATELET
BASOS ABS: 0 10*3/uL (ref 0.0–0.1)
BASOS PCT: 0 % (ref 0–1)
Eosinophils Absolute: 0.3 10*3/uL (ref 0.0–0.7)
Eosinophils Relative: 3 % (ref 0–5)
HCT: 42.2 % (ref 36.0–46.0)
HEMOGLOBIN: 13.4 g/dL (ref 12.0–15.0)
Lymphocytes Relative: 25 % (ref 12–46)
Lymphs Abs: 2.2 10*3/uL (ref 0.7–4.0)
MCH: 24.7 pg — ABNORMAL LOW (ref 26.0–34.0)
MCHC: 31.8 g/dL (ref 30.0–36.0)
MCV: 77.9 fL — ABNORMAL LOW (ref 78.0–100.0)
MONO ABS: 0.4 10*3/uL (ref 0.1–1.0)
Monocytes Relative: 5 % (ref 3–12)
NEUTROS ABS: 6 10*3/uL (ref 1.7–7.7)
NEUTROS PCT: 67 % (ref 43–77)
Platelets: 187 10*3/uL (ref 150–400)
RBC: 5.42 MIL/uL — AB (ref 3.87–5.11)
RDW: 16.7 % — ABNORMAL HIGH (ref 11.5–15.5)
WBC: 8.9 10*3/uL (ref 4.0–10.5)

## 2015-06-20 LAB — BASIC METABOLIC PANEL
BUN: 8 mg/dL (ref 7–25)
CHLORIDE: 102 mmol/L (ref 98–110)
CO2: 24 mmol/L (ref 20–31)
Calcium: 9.2 mg/dL (ref 8.6–10.4)
Creat: 0.7 mg/dL (ref 0.50–1.05)
GLUCOSE: 197 mg/dL — AB (ref 65–99)
POTASSIUM: 4.1 mmol/L (ref 3.5–5.3)
SODIUM: 137 mmol/L (ref 135–146)

## 2015-06-20 LAB — PROTIME-INR
INR: 0.96 (ref <1.50)
Prothrombin Time: 12.9 seconds (ref 11.6–15.2)

## 2015-06-20 NOTE — Addendum Note (Signed)
Addended by: [name] on: 06/20/2015 11:41 AM   Modules accepted: Orders

## 2015-06-20 NOTE — Telephone Encounter (Signed)
Spoke with patient by conference call with Dr. Elease Hashimoto.  He advised patient of test results and need for left and right heart cath.  Cath scheduled for 12/27 at 0730.  She plans to come to our office today for lab work.  I reviewed pre-procedure instructions with her and advised that I will have a letter for her with these instructions when she comes to lab.  She verbalized understanding and agreement with plan. She has been advised to postpone foot surgery which was scheduled for 12/28.  She verbalized understanding and agreement with plan of care.

## 2015-06-20 NOTE — Telephone Encounter (Signed)
Follow up     Request for surgical clearance:  1. What type of surgery is being performed? Remove hardware from ankle  2. When is this surgery scheduled? 06-25-15  3. Are there any medications that need to be held prior to surgery and how long? Medical clearance  4. Name of physician performing surgery? Dr Ophelia Charter  5. What is your office phone and fax number?  Fax [phone]

## 2015-06-20 NOTE — Telephone Encounter (Signed)
Follow up     Pt was told by Dr Elease Hashimoto to call the office and ask for [facility].

## 2015-06-20 NOTE — Telephone Encounter (Signed)
Notified [name] at [facility] about need for cardiac cath and that patient has been advised to call them to reschedule surgery.  She thanked me for the call.

## 2015-06-20 NOTE — Telephone Encounter (Signed)
I called her home phone and mobile number to discuss the results of the echo and myoview.   She will need a right and left heart cath .  I  Left a  message call the office.

## 2015-06-24 ENCOUNTER — Encounter (HOSPITAL_COMMUNITY)
Admission: RE | Disposition: A | Discharge status: Home / Self Care | Payer: Medicaid Other | Source: Ambulatory Visit | Attending: Interventional Cardiology

## 2015-06-24 ENCOUNTER — Ambulatory Visit (HOSPITAL_COMMUNITY)
Admission: RE | Admit: 2015-06-24 | Discharge: 2015-06-24 | Disposition: A | Discharge status: Home / Self Care | Payer: Medicaid Other | Source: Ambulatory Visit | Attending: Interventional Cardiology | Admitting: Interventional Cardiology

## 2015-06-24 ENCOUNTER — Encounter (HOSPITAL_COMMUNITY): Payer: Self-pay | Admitting: Interventional Cardiology

## 2015-06-24 ENCOUNTER — Inpatient Hospital Stay (HOSPITAL_COMMUNITY): Admission: RE | Admit: 2015-06-24 | Payer: Medicaid Other | Source: Ambulatory Visit

## 2015-06-24 DIAGNOSIS — Z7982 Long term (current) use of aspirin: Secondary | ICD-10-CM | POA: Insufficient documentation

## 2015-06-24 DIAGNOSIS — I447 Left bundle-branch block, unspecified: Secondary | ICD-10-CM | POA: Insufficient documentation

## 2015-06-24 DIAGNOSIS — I5021 Acute systolic (congestive) heart failure: Secondary | ICD-10-CM | POA: Diagnosis not present

## 2015-06-24 DIAGNOSIS — I10 Essential (primary) hypertension: Secondary | ICD-10-CM | POA: Diagnosis not present

## 2015-06-24 DIAGNOSIS — K219 Gastro-esophageal reflux disease without esophagitis: Secondary | ICD-10-CM | POA: Insufficient documentation

## 2015-06-24 DIAGNOSIS — G43909 Migraine, unspecified, not intractable, without status migrainosus: Secondary | ICD-10-CM | POA: Insufficient documentation

## 2015-06-24 DIAGNOSIS — Z7984 Long term (current) use of oral hypoglycemic drugs: Secondary | ICD-10-CM | POA: Diagnosis not present

## 2015-06-24 DIAGNOSIS — F17210 Nicotine dependence, cigarettes, uncomplicated: Secondary | ICD-10-CM | POA: Diagnosis not present

## 2015-06-24 DIAGNOSIS — R079 Chest pain, unspecified: Secondary | ICD-10-CM | POA: Diagnosis present

## 2015-06-24 DIAGNOSIS — E119 Type 2 diabetes mellitus without complications: Secondary | ICD-10-CM

## 2015-06-24 DIAGNOSIS — J449 Chronic obstructive pulmonary disease, unspecified: Secondary | ICD-10-CM | POA: Insufficient documentation

## 2015-06-24 DIAGNOSIS — F329 Major depressive disorder, single episode, unspecified: Secondary | ICD-10-CM | POA: Diagnosis not present

## 2015-06-24 DIAGNOSIS — Z880 Allergy status to penicillin: Secondary | ICD-10-CM | POA: Insufficient documentation

## 2015-06-24 DIAGNOSIS — M1990 Unspecified osteoarthritis, unspecified site: Secondary | ICD-10-CM | POA: Insufficient documentation

## 2015-06-24 DIAGNOSIS — Z91040 Latex allergy status: Secondary | ICD-10-CM | POA: Insufficient documentation

## 2015-06-24 DIAGNOSIS — F419 Anxiety disorder, unspecified: Secondary | ICD-10-CM | POA: Insufficient documentation

## 2015-06-24 DIAGNOSIS — Z882 Allergy status to sulfonamides status: Secondary | ICD-10-CM | POA: Diagnosis not present

## 2015-06-24 DIAGNOSIS — I5022 Chronic systolic (congestive) heart failure: Secondary | ICD-10-CM

## 2015-06-24 HISTORY — PX: CARDIAC CATHETERIZATION: SHX172

## 2015-06-24 LAB — GLUCOSE, CAPILLARY: GLUCOSE-CAPILLARY: 155 mg/dL — AB (ref 65–99)

## 2015-06-24 SURGERY — LEFT HEART CATH AND CORONARY ANGIOGRAPHY

## 2015-06-24 MED ORDER — SODIUM CHLORIDE 0.9 % IV SOLN: 250.0000 mL | INTRAVENOUS | Status: DC | PRN

## 2015-06-24 MED ORDER — MIDAZOLAM HCL 2 MG/2ML IJ SOLN
INTRAMUSCULAR | Status: AC
  Filled 2015-06-24: qty 2

## 2015-06-24 MED ORDER — SODIUM CHLORIDE 0.9 % IJ SOLN: 3.0000 mL | Freq: Two times a day (BID) | INTRAMUSCULAR | Status: DC

## 2015-06-24 MED ORDER — VERAPAMIL HCL 2.5 MG/ML IV SOLN
INTRAVENOUS | Status: DC | PRN
  Administered 2015-06-24: 08:00:00 via INTRA_ARTERIAL

## 2015-06-24 MED ORDER — SODIUM CHLORIDE 0.9 % IJ SOLN: 3.0000 mL | INTRAMUSCULAR | Status: DC | PRN

## 2015-06-24 MED ORDER — HEPARIN (PORCINE) IN NACL 2-0.9 UNIT/ML-% IJ SOLN
INTRAMUSCULAR | Status: AC
  Filled 2015-06-24: qty 1000

## 2015-06-24 MED ORDER — SODIUM CHLORIDE 0.9 % WEIGHT BASED INFUSION: 1.0000 mL/kg/h | INTRAVENOUS | Status: DC

## 2015-06-24 MED ORDER — LIDOCAINE HCL (PF) 1 % IJ SOLN
INTRAMUSCULAR | Status: AC
  Filled 2015-06-24: qty 30

## 2015-06-24 MED ORDER — ONDANSETRON HCL 4 MG/2ML IJ SOLN: 4.0000 mg | Freq: Four times a day (QID) | INTRAMUSCULAR | Status: DC | PRN

## 2015-06-24 MED ORDER — HEPARIN SODIUM (PORCINE) 1000 UNIT/ML IJ SOLN
INTRAMUSCULAR | Status: DC | PRN
  Administered 2015-06-24: 4000 [IU] via INTRAVENOUS

## 2015-06-24 MED ORDER — VERAPAMIL HCL 2.5 MG/ML IV SOLN
INTRAVENOUS | Status: AC
  Filled 2015-06-24: qty 2

## 2015-06-24 MED ORDER — HEPARIN (PORCINE) IN NACL 2-0.9 UNIT/ML-% IJ SOLN
INTRAMUSCULAR | Status: DC | PRN
  Administered 2015-06-24: 1000 mL via INTRA_ARTERIAL

## 2015-06-24 MED ORDER — ASPIRIN 81 MG PO CHEW: 81.0000 mg | CHEWABLE_TABLET | ORAL | Status: DC

## 2015-06-24 MED ORDER — ACETAMINOPHEN 325 MG PO TABS: 650.0000 mg | ORAL_TABLET | ORAL | Status: DC | PRN

## 2015-06-24 MED ORDER — MIDAZOLAM HCL 2 MG/2ML IJ SOLN
INTRAMUSCULAR | Status: DC | PRN
  Administered 2015-06-24 (×2): 1 mg via INTRAVENOUS

## 2015-06-24 MED ORDER — IOHEXOL 350 MG/ML SOLN
INTRAVENOUS | Status: DC | PRN
  Administered 2015-06-24: 60 mL via INTRA_ARTERIAL

## 2015-06-24 MED ORDER — HEPARIN SODIUM (PORCINE) 1000 UNIT/ML IJ SOLN
INTRAMUSCULAR | Status: AC
  Filled 2015-06-24: qty 1

## 2015-06-24 MED ORDER — SODIUM CHLORIDE 0.9 % WEIGHT BASED INFUSION
3.0000 mL/kg/h | INTRAVENOUS | Status: DC
  Administered 2015-06-24: 3 mL/kg/h via INTRAVENOUS

## 2015-06-24 MED ORDER — FENTANYL CITRATE (PF) 100 MCG/2ML IJ SOLN
INTRAMUSCULAR | Status: AC
  Filled 2015-06-24: qty 2

## 2015-06-24 MED ORDER — FENTANYL CITRATE (PF) 100 MCG/2ML IJ SOLN
INTRAMUSCULAR | Status: DC | PRN
  Administered 2015-06-24: 50 ug via INTRAVENOUS

## 2015-06-24 MED ORDER — LIDOCAINE HCL (PF) 1 % IJ SOLN
INTRAMUSCULAR | Status: DC | PRN
  Administered 2015-06-24: 09:00:00

## 2015-06-24 SURGICAL SUPPLY — 12 items
CATH BALLN WEDGE 5F 110CM (CATHETERS) ×2 IMPLANT
CATH INFINITI 5 FR JL3.5 (CATHETERS) ×3 IMPLANT
CATH INFINITI JR4 5F (CATHETERS) ×3 IMPLANT
DEVICE RAD COMP TR BAND LRG (VASCULAR PRODUCTS) ×3 IMPLANT
GLIDESHEATH SLEND A-KIT 6F 22G (SHEATH) ×5 IMPLANT
KIT HEART LEFT (KITS) ×3 IMPLANT
PACK CARDIAC CATHETERIZATION (CUSTOM PROCEDURE TRAY) ×3 IMPLANT
SHEATH FAST CATH BRACH 5F 5CM (SHEATH) ×2 IMPLANT
TRANSDUCER W/STOPCOCK (MISCELLANEOUS) ×4 IMPLANT
TUBING CIL FLEX 10 FLL-RA (TUBING) ×3 IMPLANT
WIRE HI TORQ VERSACORE-J 145CM (WIRE) ×2 IMPLANT
WIRE SAFE-T 1.5MM-J .035X260CM (WIRE) ×3 IMPLANT

## 2015-06-24 NOTE — H&P (View-Only) (Signed)
Cardiology Office Note   Date:  06/03/2015   ID:  [name], DOB 12-Dec-1960, MRN [id number]  PCP:  Lupe Carney, MD  Cardiologist:   Elease Hashimoto Deloris Ping, MD   Chief Complaint  Patient presents with  . Dizziness      History of Present Illness: Dec. 6, 2016:  [name] is a 54 y.o. female who presents for left arm tingling, heaviness Associated with left chest pressure - pushing sensation  Lasts from 2-4 minutes.   Occurs spontansously .  - at rest and exertion . Walks on occasion - not necessarily associated with walking  Have been going in since Oct.   Went to the ER on Oct. 12.   Saw [facility] in 2010, Wore a monitor - showed some fluttering - she does not recall what it reported.  Had a cath in 2010 for similar issues.    Results and images are not found in epic.    Smokes - 5 cigarettes a day . No ETOH Father had CABG at age 67   Past Medical History  Diagnosis Date  . Hypertension   . Anxiety   . Gallstones   . GERD (gastroesophageal reflux disease)   . COPD (chronic obstructive pulmonary disease) (HCC)   . Dysrhythmia     pt unsure of name of arrythmia - " my heart rate will drop all of a sudden" -no current treatment   . Headache(784.0)     migraines  . Rash     arms  . Arthritis   . Depression   . Diabetes mellitus without complication [facility])     Past Surgical History  Procedure Laterality Date  . Ectopic pregnancy surgery  many yrs ago  . Carpal tunnel release      bil  . Cholecystectomy  11/24/2011    Procedure: LAPAROSCOPIC CHOLECYSTECTOMY WITH INTRAOPERATIVE CHOLANGIOGRAM;  Surgeon: Clovis Pu. Cornett, MD;  Location: [facility];  Service: General;  Laterality: N/A;  Laparoscopic Cholecystectomy with Cholangiogram     Current Outpatient Prescriptions  Medication Sig Dispense Refill  . albuterol (PROVENTIL HFA;VENTOLIN HFA) 108 (90 BASE) MCG/ACT inhaler Inhale 2 puffs into the lungs every 4 (four) hours as needed for wheezing.  Wheezing    . ALPRAZolam (XANAX) 0.5 MG tablet Take 1 mg by mouth 2 (two) times daily. Anxiety    . aspirin EC 81 MG tablet Take 81 mg by mouth daily.    Marland Kitchen doxycycline (VIBRA-TABS) 100 MG tablet Take 100 mg by mouth 2 (two) times daily.    Marland Kitchen FLUoxetine (PROZAC) 40 MG capsule Take 40 mg by mouth daily with breakfast.     . glimepiride (AMARYL) 2 MG tablet Take 2 mg by mouth daily with breakfast.    . hydrOXYzine (ATARAX/VISTARIL) 25 MG tablet Take 1 tablet (25 mg total) by mouth every 6 (six) hours as needed for itching. 15 tablet 0  . ibuprofen (ADVIL,MOTRIN) 800 MG tablet Take 1 tablet (800 mg total) by mouth 3 (three) times daily. 21 tablet 0  . imipramine (TOFRANIL) 25 MG tablet Take 50 mg by mouth at bedtime. Take 2 hours before bedtime    . loperamide (IMODIUM) 2 MG capsule Take 2-4 mg by mouth 4 (four) times daily as needed for diarrhea or loose stools.    . metFORMIN (GLUCOPHAGE) 1000 MG tablet Take 1,000 mg by mouth daily with breakfast.    . omeprazole (PRILOSEC) 20 MG capsule Take 20 mg by mouth 2 (two) times daily.     Marland Kitchen  oxyCODONE-acetaminophen (PERCOCET/ROXICET) 5-325 MG per tablet Take 1 tablet by mouth every 4 (four) hours as needed. 20 tablet 0  . pravastatin (PRAVACHOL) 40 MG tablet Take 40 mg by mouth daily.    . sodium chloride (MURO 128) 5 % ophthalmic ointment Place 1 application into both eyes at bedtime.     . triamcinolone (KENALOG) 0.025 % cream Apply 1 application topically 2 (two) times daily.    . valsartan (DIOVAN) 160 MG tablet Take 160 mg by mouth daily with breakfast.      No current facility-administered medications for this visit.    Allergies:   Biaxin; Clarithromycin; Sulfa antibiotics; Amoxicillin-pot clavulanate; Aspirin; Chlorthalidone; Cholestyramine; Dilaudid; Lasix; Lisinopril; and Latex    Social History:  The patient  reports that she has been smoking.  She has never used smokeless tobacco. She reports that she does not drink alcohol or use illicit  drugs.   Family History:  The patient's family history includes Breast cancer in her sister; Lung cancer in her father.    ROS:  Please see the history of present illness.    Review of Systems: Constitutional:  denies fever, chills, diaphoresis, appetite change and fatigue.  HEENT: denies photophobia, eye pain, redness, hearing loss, ear pain, congestion, sore throat, rhinorrhea, sneezing, neck pain, neck stiffness and tinnitus.  Respiratory: denies SOB, DOE, cough, chest tightness, and wheezing.  Cardiovascular: admits to chest pain, palpitations , denies  leg swelling.  Gastrointestinal: denies nausea, vomiting, abdominal pain, diarrhea, constipation, blood in stool.  Genitourinary: denies dysuria, urgency, frequency, hematuria, flank pain and difficulty urinating.  Musculoskeletal: denies  myalgias, back pain, joint swelling, arthralgias and gait problem.   Skin: denies pallor, rash and wound.  Neurological: denies dizziness, seizures, syncope, weakness, light-headedness, numbness and headaches.   Hematological: denies adenopathy, easy bruising, personal or family bleeding history.  Psychiatric/ Behavioral: denies suicidal ideation, mood changes, confusion, nervousness, sleep disturbance and agitation.       All other systems are reviewed and negative.    PHYSICAL EXAM: VS:  BP 100/82 mmHg  Pulse 90  Ht 5' 7.5" (1.715 m)  Wt 173 lb (78.472 kg)  BMI 26.68 kg/m2  LMP 07/24/2011 , BMI Body mass index is 26.68 kg/(m^2). GEN: Well nourished, well developed, in no acute distress HEENT: normal Neck: no JVD, carotid bruits, or masses Cardiac: RRR; no murmurs, rubs, or gallops,no edema  Respiratory:  clear to auscultation bilaterally, normal work of breathing GI: soft, nontender, nondistended, + BS MS: no deformity or atrophy Skin: warm and dry, no rash Neuro:  Strength and sensation are intact Psych: normal   EKG:  EKG is ordered today. The ekg ordered today demonstrates NSR  at 90.  LBBB  - the LBBB is new since her ECG in 2013.    Recent Labs: 09/07/2014: ALT 36* 04/09/2015: BUN 7; Creatinine, Ser 0.60; Hemoglobin 12.9; Platelets 216; Potassium 3.8; Sodium 137    Lipid Panel No results found for: CHOL, TRIG, HDL, CHOLHDL, VLDL, LDLCALC, LDLDIRECT    Wt Readings from Last 3 Encounters:  06/03/15 173 lb (78.472 kg)  04/09/15 172 lb 2 oz (78.075 kg)  10/25/13 162 lb (73.483 kg)      Other studies Reviewed: Additional studies/ records that were reviewed today include: . Review of the above records demonstrates:    ASSESSMENT AND PLAN:  1.  Chest pain ;  [name]  presents with some episodes of chest discomfort. These are described as a chest tightness with radiation to the left arm.   Also associated with some left arm tingling and burning. She has a long history of cigarette smoking. She has known moderate coronary artery disease by cardiac catheterization in 2010. She also has a new left bundle-branch block since 2013.  We'll schedule her for a [employer] study.  We will also get an echocardiogram to make sure that she doesn't have significant LV dysfunction .  2. Palpitations:  She has occasional palpitations. She's worn a monitor the past but it did not reveal any significant arrhythmias. We will continue to follow her for now. I've encouraged her to stop smoking which might help with these palpitations.   Current medicines are reviewed at length with the patient today.  The patient does not have concerns regarding medicines.  The following changes have been made:  no change  Labs/ tests ordered today include:  No orders of the defined types were placed in this encounter.     Disposition:   FU with  Me in 3 months    [name], Deloris Ping, MD  06/03/2015 8:30 AM    [facility] [address] [place], [place], [place]  [address] Phone: [phone]; Fax: [phone]   Addendum  The echo and myoview show significant LV  dysfunction and a possible previous anterior wall MI. Previous cath in 2012 was unremarkable.  She will need a right and left heart cath. I have spoken to patient.  We have discussed risks, benefits, options. She understands and agrees to proceed.    [name], Deloris Ping, MD  06/20/2015 11:37 AM    [facility] [address] [place],  [address] [place], [place]  [address] Pager [phone] Phone: [phone]; Fax: [phone]

## 2015-06-24 NOTE — Interval H&P Note (Signed)
Cath Lab Visit (complete for each Cath Lab visit)  Clinical Evaluation Leading to the Procedure:   ACS: No.  Non-ACS:    Anginal Classification: CCS III  Anti-ischemic medical therapy: Maximal Therapy (2 or more classes of medications)  Non-Invasive Test Results: High-risk stress test findings: cardiac mortality >3%/year  Prior CABG: No previous CABG      History and Physical Interval Note:  06/24/2015 7:46 AM  [name]  has presented today for surgery, with the diagnosis of severe lv dysfunction  The various methods of treatment have been discussed with the patient and family. After consideration of risks, benefits and other options for treatment, the patient has consented to  Procedure(s): Right/Left Heart Cath and Coronary Angiography (N/A) as a surgical intervention .  The patient's history has been reviewed, patient examined, no change in status, stable for surgery.  I have reviewed the patient's chart and labs.  Questions were answered to the patient's satisfaction.     Lesleigh Noe

## 2015-06-24 NOTE — Discharge Instructions (Signed)
Radial Site Care °Refer to this sheet in the next few weeks. These instructions provide you with information about caring for yourself after your procedure. Your health care provider may also give you more specific instructions. Your treatment has been planned according to current medical practices, but problems sometimes occur. Call your health care provider if you have any problems or questions after your procedure. °WHAT TO EXPECT AFTER THE PROCEDURE °After your procedure, it is typical to have the following: °· Bruising at the radial site that usually fades within 1-2 weeks. °· Blood collecting in the tissue (hematoma) that may be painful to the touch. It should usually decrease in size and tenderness within 1-2 weeks. °HOME CARE INSTRUCTIONS °· Take medicines only as directed by your health care provider. °· You may shower 24-48 hours after the procedure or as directed by your health care provider. Remove the bandage (dressing) and gently wash the site with plain soap and water. Pat the area dry with a clean towel. Do not rub the site, because this may cause bleeding. °· Do not take baths, swim, or use a hot tub until your health care provider approves. °· Check your insertion site every day for redness, swelling, or drainage. °· Do not apply powder or lotion to the site. °· Do not flex or bend the affected arm for 24 hours or as directed by your health care provider. °· Do not push or pull heavy objects with the affected arm for 24 hours or as directed by your health care provider. °· Do not lift over 10 lb (4.5 kg) for 5 days after your procedure or as directed by your health care provider. °· Ask your health care provider when it is okay to: °¨ Return to work or school. °¨ Resume usual physical activities or sports. °¨ Resume sexual activity. °· Do not drive home if you are discharged the same day as the procedure. Have someone else drive you. °· You may drive 24 hours after the procedure unless otherwise  instructed by your health care provider. °· Do not operate machinery or power tools for 24 hours after the procedure. °· If your procedure was done as an outpatient procedure, which means that you went home the same day as your procedure, a responsible adult should be with you for the first 24 hours after you arrive home. °· Keep all follow-up visits as directed by your health care provider. This is important. °SEEK MEDICAL CARE IF: °· You have a fever. °· You have chills. °· You have increased bleeding from the radial site. Hold pressure on the site and call 911. °SEEK IMMEDIATE MEDICAL CARE IF: °· You have unusual pain at the radial site. °· You have redness, warmth, or swelling at the radial site. °· You have drainage (other than a small amount of blood on the dressing) from the radial site. °· The radial site is bleeding, and the bleeding does not stop after 30 minutes of holding steady pressure on the site. °· Your arm or hand becomes pale, cool, tingly, or numb. °  °This information is not intended to replace advice given to you by your health care provider. Make sure you discuss any questions you have with your health care provider. °  °Document Released: 07/17/2010 Document Revised: 07/05/2014 Document Reviewed: 12/31/2013 °Elsevier Interactive Patient Education ©2016 [employer] ° °

## 2015-06-25 ENCOUNTER — Inpatient Hospital Stay (HOSPITAL_COMMUNITY): Admission: RE | Admit: 2015-06-25 | Payer: Medicaid Other | Source: Ambulatory Visit | Admitting: Orthopaedic Surgery

## 2015-06-25 ENCOUNTER — Encounter (HOSPITAL_COMMUNITY): Admission: RE | Payer: Self-pay | Source: Ambulatory Visit

## 2015-06-25 SURGERY — REMOVAL, HARDWARE
Anesthesia: General | Laterality: Right

## 2015-06-25 MED FILL — Lidocaine HCl Local Preservative Free (PF) Inj 1%: INTRAMUSCULAR | Qty: 30 | Status: AC

## 2015-07-14 ENCOUNTER — Other Ambulatory Visit (HOSPITAL_COMMUNITY): Payer: Self-pay | Admitting: Orthopedic Surgery

## 2015-07-14 ENCOUNTER — Telehealth: Payer: Self-pay | Admitting: Cardiovascular Disease

## 2015-07-14 ENCOUNTER — Encounter (HOSPITAL_COMMUNITY): Payer: Self-pay | Admitting: *Deleted

## 2015-07-14 MED ORDER — CHLORHEXIDINE GLUCONATE 4 % EX LIQD: 60.0000 mL | Freq: Once | CUTANEOUS | Status: DC

## 2015-07-14 NOTE — Telephone Encounter (Signed)
Kim to fax clearance to [phone]. Patient's case is an add-on at the end of the day. Informed Kim clearance will be given to Dr. Elease Hashimoto ASAP.

## 2015-07-14 NOTE — Progress Notes (Signed)
Anesthesia Chart Review:  Pt is 55 year old female scheduled for R ankle hardware removal, placement of stimulan antibiotic beads, provena wound vac on 07/15/2015 with [name].   PCP is Dr. Lupe Carney. Cardiologist is Dr. Elease Hashimoto.   History includes current smoker, HTN, DM2, dysrhythmias (episodes of SVT and asymptomatic bradycardia with rates in the 30's during sleep during 01/2009 hospitalization), anxiety, depression, GERD, COPD, migraines, cholecystectomy '13, ectopic pregnancy surgery. BMI 27.   Meds include albuterol, ASA 81 mg, doxycycline, glimepiride, imipramine, omeprazole, Percocet, pravastatin, sitagliptin, tiotropium, valsartan.   Pt will need labs DOS.   04/09/15 CXR: IMPRESSION: No active cardiopulmonary disease.  06/03/15 EKG: NSR, LAD, left BBB. Left BBB new since 2015.  Cardiac cath 06/24/15 (for abnormal stress test):   Widely patent coronary arteries. Right dominant coronary circulation anatomy.  Severe global left ventricular dysfunction with EF less than 20%. Upper normal left ventricular filling pressures with EDP less than 18 mmHg. RECOMMENDATIONS:  Medical therapy  Consider resynchronization therapy if there is no improvement in LV function on meds.  Echo 06/18/15:  - Left ventricle: The cavity size was mildly dilated. Systolic function was severely reduced. The estimated ejection fraction was in the range of 25% to 30%. Diffuse hypokinesis. Doppler parameters are consistent with abnormal left ventricular relaxation (grade 1 diastolic dysfunction). Doppler parameters are consistent with high ventricular filling pressure. - Aortic valve: There was trivial regurgitation. - Mitral valve: There was mild regurgitation. - Left atrium: The atrium was mildly dilated. - Impressions:Severe global reduction in LV function; grade 1 diastolic dysfunction with elevated LV filling pressure; mild LVE and LAE; trace AI; mild MR.  Reviewed case with Dr. Krista Blue.   If labs  acceptable DOS, I anticipate pt can proceed as scheduled.   Rica Mast, FNP-BC [facility]/Anesthesiology Phone: [phone] 07/14/2015 3:16 PM

## 2015-07-14 NOTE — Telephone Encounter (Signed)
Left message for Selena Batten to call back with surgery information as clearance form is not in Dr. Harvie Bridge box or on his cart.

## 2015-07-14 NOTE — Telephone Encounter (Signed)
Kim with Dr. Diamantina Providence office calling to check on a surg clearance request she faxed Friday-pt's ankle is infected and he wants to do surgery tomorrow

## 2015-07-14 NOTE — Telephone Encounter (Signed)
Clearance received. Placed on Dr. Harvie Bridge cart for review and ASAP reply.

## 2015-07-14 NOTE — Progress Notes (Signed)
I called and spoke with Selena Batten (Dr Diamantina Providence office) and asked for orders for tomorrows surgery.

## 2015-07-15 ENCOUNTER — Ambulatory Visit (HOSPITAL_COMMUNITY): Payer: Medicaid Other | Admitting: Emergency Medicine

## 2015-07-15 ENCOUNTER — Encounter (HOSPITAL_COMMUNITY): Payer: Self-pay | Admitting: *Deleted

## 2015-07-15 ENCOUNTER — Observation Stay (HOSPITAL_COMMUNITY)
Admission: RE | Admit: 2015-07-15 | Discharge: 2015-07-17 | Disposition: A | Discharge status: Home / Self Care | Payer: Medicaid Other | Source: Ambulatory Visit | Attending: Orthopedic Surgery | Admitting: Orthopedic Surgery

## 2015-07-15 ENCOUNTER — Encounter (HOSPITAL_COMMUNITY)
Admission: RE | Disposition: A | Discharge status: Home / Self Care | Payer: Self-pay | Source: Ambulatory Visit | Attending: Orthopedic Surgery

## 2015-07-15 DIAGNOSIS — Z7982 Long term (current) use of aspirin: Secondary | ICD-10-CM | POA: Diagnosis not present

## 2015-07-15 DIAGNOSIS — I10 Essential (primary) hypertension: Secondary | ICD-10-CM | POA: Insufficient documentation

## 2015-07-15 DIAGNOSIS — E119 Type 2 diabetes mellitus without complications: Secondary | ICD-10-CM | POA: Insufficient documentation

## 2015-07-15 DIAGNOSIS — M869 Osteomyelitis, unspecified: Secondary | ICD-10-CM | POA: Diagnosis present

## 2015-07-15 DIAGNOSIS — F17200 Nicotine dependence, unspecified, uncomplicated: Secondary | ICD-10-CM | POA: Insufficient documentation

## 2015-07-15 DIAGNOSIS — J449 Chronic obstructive pulmonary disease, unspecified: Secondary | ICD-10-CM | POA: Diagnosis not present

## 2015-07-15 DIAGNOSIS — Y838 Other surgical procedures as the cause of abnormal reaction of the patient, or of later complication, without mention of misadventure at the time of the procedure: Secondary | ICD-10-CM | POA: Diagnosis not present

## 2015-07-15 DIAGNOSIS — Z880 Allergy status to penicillin: Secondary | ICD-10-CM | POA: Insufficient documentation

## 2015-07-15 DIAGNOSIS — T847XXA Infection and inflammatory reaction due to other internal orthopedic prosthetic devices, implants and grafts, initial encounter: Secondary | ICD-10-CM | POA: Diagnosis not present

## 2015-07-15 DIAGNOSIS — Z7984 Long term (current) use of oral hypoglycemic drugs: Secondary | ICD-10-CM | POA: Insufficient documentation

## 2015-07-15 HISTORY — PX: HARDWARE REMOVAL: SHX979

## 2015-07-15 LAB — BASIC METABOLIC PANEL
Anion gap: 11 (ref 5–15)
BUN: 8 mg/dL (ref 6–20)
CALCIUM: 9.4 mg/dL (ref 8.9–10.3)
CHLORIDE: 106 mmol/L (ref 101–111)
CO2: 23 mmol/L (ref 22–32)
CREATININE: 0.58 mg/dL (ref 0.44–1.00)
GFR calc non Af Amer: >60 mL/min (ref >60)
Glucose, Bld: 156 mg/dL — ABNORMAL HIGH (ref 65–99)
Potassium: 3.9 mmol/L (ref 3.5–5.1)
SODIUM: 140 mmol/L (ref 135–145)

## 2015-07-15 LAB — CBC
HCT: 40.3 % (ref 36.0–46.0)
Hemoglobin: 12.9 g/dL (ref 12.0–15.0)
MCH: 25 pg — ABNORMAL LOW (ref 26.0–34.0)
MCHC: 32 g/dL (ref 30.0–36.0)
MCV: 78.1 fL (ref 78.0–100.0)
PLATELETS: 196 10*3/uL (ref 150–400)
RBC: 5.16 MIL/uL — AB (ref 3.87–5.11)
RDW: 16.6 % — ABNORMAL HIGH (ref 11.5–15.5)
WBC: 8.6 10*3/uL (ref 4.0–10.5)

## 2015-07-15 LAB — GLUCOSE, CAPILLARY
GLUCOSE-CAPILLARY: 142 mg/dL — AB (ref 65–99)
GLUCOSE-CAPILLARY: 130 mg/dL — AB (ref 65–99)
Glucose-Capillary: 165 mg/dL — ABNORMAL HIGH (ref 65–99)

## 2015-07-15 SURGERY — REMOVAL, HARDWARE
Anesthesia: Monitor Anesthesia Care | Site: Ankle | Laterality: Right

## 2015-07-15 MED ORDER — OXYCODONE HCL 5 MG PO TABS
5.0000 mg | ORAL_TABLET | ORAL | Status: DC | PRN
  Administered 2015-07-16 (×3): 10 mg via ORAL
  Administered 2015-07-16 (×2): 5 mg via ORAL
  Administered 2015-07-16 – 2015-07-17 (×2): 10 mg via ORAL
  Filled 2015-07-15 (×6): qty 2
  Filled 2015-07-15: qty 1

## 2015-07-15 MED ORDER — PANTOPRAZOLE SODIUM 40 MG PO TBEC
40.0000 mg | DELAYED_RELEASE_TABLET | Freq: Every day | ORAL | Status: DC
  Administered 2015-07-16: 40 mg via ORAL
  Filled 2015-07-15: qty 1

## 2015-07-15 MED ORDER — LINAGLIPTIN 5 MG PO TABS
5.0000 mg | ORAL_TABLET | Freq: Every day | ORAL | Status: DC
  Administered 2015-07-16: 5 mg via ORAL
  Filled 2015-07-15: qty 1

## 2015-07-15 MED ORDER — METHOCARBAMOL 500 MG PO TABS
500.0000 mg | ORAL_TABLET | Freq: Four times a day (QID) | ORAL | Status: DC | PRN
  Administered 2015-07-16 – 2015-07-17 (×5): 500 mg via ORAL
  Filled 2015-07-15 (×5): qty 1

## 2015-07-15 MED ORDER — OXYCODONE HCL 5 MG/5ML PO SOLN: 5.0000 mg | Freq: Once | ORAL | Status: DC | PRN

## 2015-07-15 MED ORDER — CLINDAMYCIN PHOSPHATE 900 MG/50ML IV SOLN
900.0000 mg | INTRAVENOUS | Status: DC
  Filled 2015-07-15: qty 50

## 2015-07-15 MED ORDER — CHLORHEXIDINE GLUCONATE 4 % EX LIQD: 60.0000 mL | Freq: Once | CUTANEOUS | Status: DC

## 2015-07-15 MED ORDER — IMIPRAMINE HCL 50 MG PO TABS
50.0000 mg | ORAL_TABLET | Freq: Every day | ORAL | Status: DC
  Administered 2015-07-15 – 2015-07-16 (×2): 50 mg via ORAL
  Filled 2015-07-15 (×4): qty 1

## 2015-07-15 MED ORDER — DIPHENHYDRAMINE HCL 25 MG PO CAPS
25.0000 mg | ORAL_CAPSULE | Freq: Four times a day (QID) | ORAL | Status: DC | PRN
  Administered 2015-07-15: 25 mg via ORAL
  Filled 2015-07-15: qty 1

## 2015-07-15 MED ORDER — FENTANYL CITRATE (PF) 100 MCG/2ML IJ SOLN: 25.0000 ug | INTRAMUSCULAR | Status: DC | PRN

## 2015-07-15 MED ORDER — 0.9 % SODIUM CHLORIDE (POUR BTL) OPTIME
TOPICAL | Status: DC | PRN
  Administered 2015-07-15: 3000 mL

## 2015-07-15 MED ORDER — LACTATED RINGERS IV SOLN
INTRAVENOUS | Status: DC
  Administered 2015-07-15: 16:00:00 via INTRAVENOUS

## 2015-07-15 MED ORDER — FENTANYL CITRATE (PF) 100 MCG/2ML IJ SOLN
100.0000 ug | Freq: Once | INTRAMUSCULAR | Status: AC
  Administered 2015-07-15: 100 ug via INTRAVENOUS

## 2015-07-15 MED ORDER — RIVAROXABAN 10 MG PO TABS
10.0000 mg | ORAL_TABLET | ORAL | Status: DC
  Administered 2015-07-16 – 2015-07-17 (×2): 10 mg via ORAL
  Filled 2015-07-15 (×3): qty 1

## 2015-07-15 MED ORDER — VANCOMYCIN HCL IN DEXTROSE 1-5 GM/200ML-% IV SOLN
INTRAVENOUS | Status: AC
  Filled 2015-07-15: qty 200

## 2015-07-15 MED ORDER — GENTAMICIN SULFATE 40 MG/ML IJ SOLN
INTRAMUSCULAR | Status: DC | PRN
  Administered 2015-07-15: 120 mg via INTRAMUSCULAR

## 2015-07-15 MED ORDER — ACETAMINOPHEN 160 MG/5ML PO SOLN: 325.0000 mg | ORAL | Status: DC | PRN

## 2015-07-15 MED ORDER — PRAVASTATIN SODIUM 40 MG PO TABS
40.0000 mg | ORAL_TABLET | Freq: Every day | ORAL | Status: DC
  Administered 2015-07-16: 40 mg via ORAL
  Filled 2015-07-15: qty 1

## 2015-07-15 MED ORDER — MIDAZOLAM HCL 2 MG/2ML IJ SOLN
INTRAMUSCULAR | Status: AC
  Administered 2015-07-15: 2 mg via INTRAVENOUS
  Filled 2015-07-15: qty 2

## 2015-07-15 MED ORDER — LACTATED RINGERS IV SOLN
INTRAVENOUS | Status: DC | PRN
  Administered 2015-07-15: 17:00:00 via INTRAVENOUS

## 2015-07-15 MED ORDER — HYDROXYZINE HCL 10 MG PO TABS
20.0000 mg | ORAL_TABLET | Freq: Four times a day (QID) | ORAL | Status: DC | PRN
  Administered 2015-07-16: 20 mg via ORAL
  Filled 2015-07-15 (×3): qty 2

## 2015-07-15 MED ORDER — GENTAMICIN SULFATE 40 MG/ML IJ SOLN
INTRAMUSCULAR | Status: AC
  Filled 2015-07-15: qty 4

## 2015-07-15 MED ORDER — ACETAMINOPHEN 325 MG PO TABS
325.0000 mg | ORAL_TABLET | ORAL | Status: DC | PRN
  Administered 2015-07-15: 650 mg via ORAL

## 2015-07-15 MED ORDER — DIPHENHYDRAMINE HCL 25 MG PO TABS
25.0000 mg | ORAL_TABLET | Freq: Four times a day (QID) | ORAL | Status: DC | PRN
  Filled 2015-07-15: qty 1

## 2015-07-15 MED ORDER — METOCLOPRAMIDE HCL 5 MG/ML IJ SOLN: 5.0000 mg | Freq: Three times a day (TID) | INTRAMUSCULAR | Status: DC | PRN

## 2015-07-15 MED ORDER — ACETAMINOPHEN 325 MG PO TABS: 650.0000 mg | ORAL_TABLET | Freq: Four times a day (QID) | ORAL | Status: DC | PRN

## 2015-07-15 MED ORDER — VANCOMYCIN HCL 500 MG IV SOLR
INTRAVENOUS | Status: AC
  Filled 2015-07-15: qty 500

## 2015-07-15 MED ORDER — LIDOCAINE HCL (CARDIAC) 20 MG/ML IV SOLN
INTRAVENOUS | Status: DC | PRN
  Administered 2015-07-15: 50 mg via INTRAVENOUS

## 2015-07-15 MED ORDER — ONDANSETRON HCL 4 MG/2ML IJ SOLN: 4.0000 mg | Freq: Four times a day (QID) | INTRAMUSCULAR | Status: DC | PRN

## 2015-07-15 MED ORDER — OXYCODONE HCL 5 MG PO TABS: 5.0000 mg | ORAL_TABLET | Freq: Once | ORAL | Status: DC | PRN

## 2015-07-15 MED ORDER — MORPHINE SULFATE (PF) 4 MG/ML IV SOLN
4.0000 mg | INTRAVENOUS | Status: DC | PRN
  Administered 2015-07-16 – 2015-07-17 (×2): 4 mg via INTRAVENOUS
  Filled 2015-07-15 (×2): qty 1

## 2015-07-15 MED ORDER — ACETAMINOPHEN 650 MG RE SUPP: 650.0000 mg | Freq: Four times a day (QID) | RECTAL | Status: DC | PRN

## 2015-07-15 MED ORDER — GLIMEPIRIDE 2 MG PO TABS
2.0000 mg | ORAL_TABLET | Freq: Every day | ORAL | Status: DC
  Administered 2015-07-16: 2 mg via ORAL
  Filled 2015-07-15 (×4): qty 1

## 2015-07-15 MED ORDER — FLUOXETINE HCL 20 MG PO CAPS
40.0000 mg | ORAL_CAPSULE | Freq: Every day | ORAL | Status: DC
  Administered 2015-07-16 – 2015-07-17 (×2): 40 mg via ORAL
  Filled 2015-07-15 (×4): qty 2

## 2015-07-15 MED ORDER — PROPOFOL 500 MG/50ML IV EMUL
INTRAVENOUS | Status: DC | PRN
  Administered 2015-07-15: 75 ug/kg/min via INTRAVENOUS

## 2015-07-15 MED ORDER — METOCLOPRAMIDE HCL 5 MG PO TABS: 5.0000 mg | ORAL_TABLET | Freq: Three times a day (TID) | ORAL | Status: DC | PRN

## 2015-07-15 MED ORDER — MEPIVACAINE HCL 1.5 % IJ SOLN
INTRAMUSCULAR | Status: DC | PRN
  Administered 2015-07-15: 5 mL via PERINEURAL
  Administered 2015-07-15: 10 mL via PERINEURAL

## 2015-07-15 MED ORDER — SODIUM CHLORIDE (HYPERTONIC) 5 % OP OINT
1.0000 | TOPICAL_OINTMENT | Freq: Every day | OPHTHALMIC | Status: DC
Start: 2015-07-15 — End: 2015-07-15
  Filled 2015-07-15: qty 3.5

## 2015-07-15 MED ORDER — VANCOMYCIN HCL IN DEXTROSE 1-5 GM/200ML-% IV SOLN
1000.0000 mg | Freq: Two times a day (BID) | INTRAVENOUS | Status: AC
  Administered 2015-07-16: 1000 mg via INTRAVENOUS
  Filled 2015-07-15: qty 200

## 2015-07-15 MED ORDER — METHOCARBAMOL 1000 MG/10ML IJ SOLN
500.0000 mg | Freq: Four times a day (QID) | INTRAVENOUS | Status: DC | PRN
  Filled 2015-07-15: qty 5

## 2015-07-15 MED ORDER — MIDAZOLAM HCL 2 MG/2ML IJ SOLN
2.0000 mg | Freq: Once | INTRAMUSCULAR | Status: AC
  Administered 2015-07-15: 2 mg via INTRAVENOUS

## 2015-07-15 MED ORDER — ALBUTEROL SULFATE (2.5 MG/3ML) 0.083% IN NEBU
3.0000 mL | INHALATION_SOLUTION | RESPIRATORY_TRACT | Status: DC | PRN
  Administered 2015-07-17: 3 mL via RESPIRATORY_TRACT
  Filled 2015-07-15: qty 3

## 2015-07-15 MED ORDER — VANCOMYCIN HCL 500 MG IV SOLR
INTRAVENOUS | Status: DC | PRN
  Administered 2015-07-15: 500 mg

## 2015-07-15 MED ORDER — ACETAMINOPHEN 325 MG PO TABS
ORAL_TABLET | ORAL | Status: AC
  Filled 2015-07-15: qty 2

## 2015-07-15 MED ORDER — BUPIVACAINE-EPINEPHRINE (PF) 0.5% -1:200000 IJ SOLN
INTRAMUSCULAR | Status: DC | PRN
  Administered 2015-07-15: 20 mL via PERINEURAL
  Administered 2015-07-15: 10 mL via PERINEURAL

## 2015-07-15 MED ORDER — IRBESARTAN 150 MG PO TABS
150.0000 mg | ORAL_TABLET | Freq: Every day | ORAL | Status: DC
  Administered 2015-07-16: 150 mg via ORAL
  Filled 2015-07-15: qty 1

## 2015-07-15 MED ORDER — FENTANYL CITRATE (PF) 100 MCG/2ML IJ SOLN
INTRAMUSCULAR | Status: AC
  Administered 2015-07-15: 100 ug via INTRAVENOUS
  Filled 2015-07-15: qty 2

## 2015-07-15 MED ORDER — ONDANSETRON HCL 4 MG PO TABS: 4.0000 mg | ORAL_TABLET | Freq: Four times a day (QID) | ORAL | Status: DC | PRN

## 2015-07-15 MED ORDER — ALPRAZOLAM 0.5 MG PO TABS
1.0000 mg | ORAL_TABLET | Freq: Two times a day (BID) | ORAL | Status: DC
  Administered 2015-07-15 – 2015-07-16 (×3): 1 mg via ORAL
  Filled 2015-07-15 (×3): qty 2

## 2015-07-15 MED ORDER — TIOTROPIUM BROMIDE MONOHYDRATE 18 MCG IN CAPS
18.0000 ug | ORAL_CAPSULE | Freq: Every day | RESPIRATORY_TRACT | Status: DC
  Administered 2015-07-16: 18 ug via RESPIRATORY_TRACT
  Filled 2015-07-15: qty 5

## 2015-07-15 MED ORDER — POTASSIUM CHLORIDE IN NACL 20-0.9 MEQ/L-% IV SOLN
INTRAVENOUS | Status: AC
  Administered 2015-07-16 (×2): via INTRAVENOUS
  Filled 2015-07-15 (×2): qty 1000

## 2015-07-15 MED ORDER — VANCOMYCIN HCL 1000 MG IV SOLR
1000.0000 mg | INTRAVENOUS | Status: DC | PRN
  Administered 2015-07-15: 1000 mg via INTRAVENOUS

## 2015-07-15 SURGICAL SUPPLY — 51 items
BANDAGE ELASTIC 4 VELCRO ST LF (GAUZE/BANDAGES/DRESSINGS) ×2 IMPLANT
BANDAGE ELASTIC 6 VELCRO ST LF (GAUZE/BANDAGES/DRESSINGS) IMPLANT
BANDAGE ESMARK 6X9 LF (GAUZE/BANDAGES/DRESSINGS) IMPLANT
BNDG CMPR 9X6 STRL LF SNTH (GAUZE/BANDAGES/DRESSINGS)
BNDG COHESIVE 4X5 TAN STRL (GAUZE/BANDAGES/DRESSINGS) IMPLANT
BNDG ESMARK 6X9 LF (GAUZE/BANDAGES/DRESSINGS)
BNDG GAUZE ELAST 4 BULKY (GAUZE/BANDAGES/DRESSINGS) ×3 IMPLANT
COVER SURGICAL LIGHT HANDLE (MISCELLANEOUS) ×3 IMPLANT
CUFF TOURNIQUET SINGLE 34IN LL (TOURNIQUET CUFF) IMPLANT
CUFF TOURNIQUET SINGLE 44IN (TOURNIQUET CUFF) IMPLANT
DRAPE C-ARM 42X72 X-RAY (DRAPES) IMPLANT
DRAPE EXTREMITY T 121X128X90 (DRAPE) IMPLANT
DRAPE INCISE IOBAN 66X45 STRL (DRAPES) IMPLANT
DRAPE ORTHO SPLIT 77X108 STRL (DRAPES)
DRAPE PROXIMA HALF (DRAPES) IMPLANT
DRAPE SURG ORHT 6 SPLT 77X108 (DRAPES) IMPLANT
DRSG EMULSION OIL 3X3 NADH (GAUZE/BANDAGES/DRESSINGS) ×3 IMPLANT
DRSG PAD ABDOMINAL 8X10 ST (GAUZE/BANDAGES/DRESSINGS) ×3 IMPLANT
ELECT REM PT RETURN 9FT ADLT (ELECTROSURGICAL) ×3
ELECTRODE REM PT RTRN 9FT ADLT (ELECTROSURGICAL) ×1 IMPLANT
GAUZE SPONGE 4X4 12PLY STRL (GAUZE/BANDAGES/DRESSINGS) ×3 IMPLANT
GAUZE XEROFORM 1X8 LF (GAUZE/BANDAGES/DRESSINGS) ×3 IMPLANT
GLOVE BIOGEL PI IND STRL 8 (GLOVE) ×1 IMPLANT
GLOVE BIOGEL PI INDICATOR 8 (GLOVE) ×2
GLOVE SURG ORTHO 8.0 STRL STRW (GLOVE) ×3 IMPLANT
GOWN STRL REUS W/ TWL LRG LVL3 (GOWN DISPOSABLE) ×3 IMPLANT
GOWN STRL REUS W/TWL LRG LVL3 (GOWN DISPOSABLE) ×9
KIT BASIN OR (CUSTOM PROCEDURE TRAY) ×3 IMPLANT
KIT PREVENA INCISION MGT 13 (CANNISTER) ×2 IMPLANT
KIT ROOM TURNOVER OR (KITS) ×3 IMPLANT
KIT STIMULAN RAPID CURE 5CC (Orthopedic Implant) ×2 IMPLANT
MANIFOLD NEPTUNE II (INSTRUMENTS) ×3 IMPLANT
NS IRRIG 1000ML POUR BTL (IV SOLUTION) ×3 IMPLANT
PACK GENERAL/GYN (CUSTOM PROCEDURE TRAY) ×3 IMPLANT
PAD ARMBOARD 7.5X6 YLW CONV (MISCELLANEOUS) ×6 IMPLANT
PAD CAST 4YDX4 CTTN HI CHSV (CAST SUPPLIES) ×2 IMPLANT
PADDING CAST COTTON 4X4 STRL (CAST SUPPLIES) ×6
PADDING CAST COTTON 6X4 STRL (CAST SUPPLIES) ×6 IMPLANT
STAPLER VISISTAT 35W (STAPLE) ×3 IMPLANT
STOCKINETTE IMPERVIOUS 9X36 MD (GAUZE/BANDAGES/DRESSINGS) IMPLANT
SUT ETHILON 3 0 PS 1 (SUTURE) ×8 IMPLANT
SUT ETHILON 4 0 FS 1 (SUTURE) IMPLANT
SUT VIC AB 0 CT1 27 (SUTURE)
SUT VIC AB 0 CT1 27XBRD ANBCTR (SUTURE) IMPLANT
SUT VIC AB 2-0 CT1 27 (SUTURE)
SUT VIC AB 2-0 CT1 TAPERPNT 27 (SUTURE) IMPLANT
SWAB COLLECTION DEVICE MRSA (MISCELLANEOUS) ×3 IMPLANT
SWAB CULTURE ESWAB REG 1ML (MISCELLANEOUS) ×2 IMPLANT
TOWEL OR 17X24 6PK STRL BLUE (TOWEL DISPOSABLE) ×3 IMPLANT
TOWEL OR 17X26 10 PK STRL BLUE (TOWEL DISPOSABLE) ×3 IMPLANT
WATER STERILE IRR 1000ML POUR (IV SOLUTION) ×3 IMPLANT

## 2015-07-15 NOTE — Brief Op Note (Signed)
07/15/2015  7:20 PM  PATIENT:  [name]  55 y.o. female  PRE-OPERATIVE DIAGNOSIS:  RIGHT ANKLE INFECTION  POST-OPERATIVE DIAGNOSIS:  RIGHT ANKLE INFECTION  PROCEDURE:  Procedure(s): RIGHT ANKLE HARDWARE REMOVAL, PLACEMENT OF STIMULAN ANTIBIOTIC BEADS AND PREVENA WOUND VAC  Excisional debridement.  SURGEON:  Surgeon(s): [name], MD  ASSISTANT:  Patrick Jupiter  RNFA  ANESTHESIA:   regional  EBL: 25 ml       BLOOD ADMINISTERED: none  DRAINS: Wound VAC   LOCAL MEDICATIONS USED:  none  SPECIMEN:  cxs x 1  COUNTS:  YES  TOURNIQUET:  * No tourniquets in log *  DICTATION: .Other Dictation: Dictation Number [id number]  PLAN OF CARE: Admit for overnight observation  PATIENT DISPOSITION:  PACU - hemodynamically stable

## 2015-07-15 NOTE — Transfer of Care (Signed)
Immediate Anesthesia Transfer of Care Note  Patient: [name]  Procedure(s) Performed: Procedure(s): RIGHT ANKLE HARDWARE REMOVAL, PLACEMENT OF STIMULAN ANTIBIOTIC BEADS AND PREVENA WOUND VAC. (Right)  Patient Location: PACU  Anesthesia Type:MAC combined with regional for post-op pain  Level of Consciousness: awake, alert  and oriented  Airway & Oxygen Therapy: Patient Spontanous Breathing and Patient connected to nasal cannula oxygen  Post-op Assessment: Report given to RN and Post -op Vital signs reviewed and stable  Post vital signs: Reviewed and stable  Last Vitals:  Filed Vitals:   07/15/15 1655 07/15/15 1924  BP: 135/71 111/70  Pulse: 101 94  Temp:    Resp: 15 19    Complications: No apparent anesthesia complications

## 2015-07-15 NOTE — Consult Note (Signed)
PHARMACY NOTE   Pharmacy Consult for :  Xarelto Indication:  VTE prophylaxis following ankle surgery  Dosing Weight: 80 kg  Labs:  Recent Labs  07/15/15 1530  HGB 12.9  HCT 40.3  PLT 196  CREATININE 0.58   Lab Results  Component Value Date   INR 0.96 06/20/2015   INR 0.98 11/10/2011   INR 1.0 02/23/2009    Estimated Creatinine Clearance: 88.6 mL/min (by C-G formula based on Cr of 0.58).  Current Medication[s] Include: Medication PTA: Prescriptions prior to admission  Medication Sig Dispense Refill Last Dose  . albuterol (PROVENTIL HFA;VENTOLIN HFA) 108 (90 BASE) MCG/ACT inhaler Inhale 2 puffs into the lungs every 4 (four) hours as needed for wheezing. Wheezing   07/15/2015 at Unknown time  . ALPRAZolam (XANAX) 0.5 MG tablet Take 1 mg by mouth 2 (two) times daily. Anxiety   07/15/2015 at Unknown time  . aspirin EC 81 MG tablet Take 81 mg by mouth daily.   07/09/2015  . diphenhydrAMINE (BENADRYL) 25 MG tablet Take 25 mg by mouth every 6 (six) hours as needed for itching or allergies.   07/14/2015 at Unknown time  . doxycycline (VIBRA-TABS) 100 MG tablet Take 100 mg by mouth 2 (two) times daily.   07/15/2015 at Unknown time  . FLUoxetine (PROZAC) 40 MG capsule Take 40 mg by mouth daily with breakfast.    07/14/2015 at Unknown time  . glimepiride (AMARYL) 2 MG tablet Take 2 mg by mouth daily with breakfast.   07/14/2015 at Unknown time  . hydrOXYzine (ATARAX/VISTARIL) 10 MG tablet Take 20 mg by mouth every 6 (six) hours as needed for itching.   07/15/2015 at Unknown time  . omeprazole (PRILOSEC) 20 MG capsule Take 20 mg by mouth 2 (two) times daily.    07/15/2015 at Unknown time  . oxyCODONE-acetaminophen (PERCOCET/ROXICET) 5-325 MG per tablet Take 1 tablet by mouth every 4 (four) hours as needed. (Patient taking differently: Take 1 tablet by mouth every 6 (six) hours as needed for moderate pain. ) 20 tablet 0 07/14/2015 at Unknown time  . pravastatin (PRAVACHOL) 40 MG tablet Take 40 mg  by mouth daily.   07/15/2015 at Unknown time  . sitaGLIPtin (JANUVIA) 100 MG tablet Take 100 mg by mouth daily.   07/14/2015 at Unknown time  . sodium chloride (MURO 128) 5 % ophthalmic ointment Place 1 application into both eyes at bedtime.    07/14/2015 at Unknown time  . tiotropium (SPIRIVA) 18 MCG inhalation capsule Place 18 mcg into inhaler and inhale daily.   07/15/2015 at Unknown time  . triamcinolone (KENALOG) 0.025 % cream Apply 1 application topically 2 (two) times daily.   07/14/2015 at Unknown time  . valsartan (DIOVAN) 160 MG tablet Take 160 mg by mouth daily with breakfast.    07/14/2015 at Unknown time  . hydrOXYzine (ATARAX/VISTARIL) 25 MG tablet Take 1 tablet (25 mg total) by mouth every 6 (six) hours as needed for itching. (Patient not taking: Reported on 07/14/2015) 15 tablet 0 Past Month at Unknown time  . ibuprofen (ADVIL,MOTRIN) 800 MG tablet Take 1 tablet (800 mg total) by mouth 3 (three) times daily. (Patient not taking: Reported on 07/14/2015) 21 tablet 0 ~ 1 month  . imipramine (TOFRANIL) 25 MG tablet Take 50 mg by mouth at bedtime. Take 2 hours before bedtime   More than a month at Unknown time  . loperamide (IMODIUM) 2 MG capsule Take 2 mg by mouth 2 (two) times daily.    06/23/2015  at Unknown time  . metFORMIN (GLUCOPHAGE) 1000 MG tablet Take 1,000 mg by mouth daily with breakfast.   06/23/2015 at Unknown time   Scheduled:  Scheduled:  . acetaminophen      . ALPRAZolam  1 mg Oral BID  . [START ON 07/16/2015] FLUoxetine  40 mg Oral Q breakfast  . [START ON 07/16/2015] glimepiride  2 mg Oral Q breakfast  . imipramine  50 mg Oral QHS  . [START ON 07/16/2015] irbesartan  150 mg Oral Daily  . [START ON 07/16/2015] linagliptin  5 mg Oral Daily  . [START ON 07/16/2015] pantoprazole  40 mg Oral Daily  . [START ON 07/16/2015] pravastatin  40 mg Oral q1800  . [START ON 07/16/2015] rivaroxaban  10 mg Oral Q24H  . [START ON 07/16/2015] tiotropium  18 mcg Inhalation Daily  . [START ON  07/16/2015] vancomycin  1,000 mg Intravenous Q12H   Infusion[s]: Infusions:  . 0.9 % NaCl with KCl 20 mEq / L     Antibiotic[s]: Anti-infectives    Start     Dose/Rate Route Frequency Ordered Stop   07/16/15 0630  vancomycin (VANCOCIN) IVPB 1000 mg/200 mL premix     1,000 mg 200 mL/hr over 60 Minutes Intravenous Every 12 hours 07/15/15 2210 07/16/15 1829   07/16/15 0600  clindamycin (CLEOCIN) IVPB 900 mg  Status:  Discontinued     900 mg 100 mL/hr over 30 Minutes Intravenous On call to O.R. 07/15/15 1508 07/15/15 2156   07/15/15 1843  vancomycin (VANCOCIN) powder  Status:  Discontinued       As needed 07/15/15 1843 07/15/15 1920   07/15/15 1841  gentamicin (GARAMYCIN) injection  Status:  Discontinued       As needed 07/15/15 1842 07/15/15 1920     Assessment:  55 y/o female s/p R-ankle surgery who will be started on Xarelto for VTE prophylaxis.  Baseline coags and CBC all wnl's.  No evidence of bleeding problems.   Plan: 1. Will begin Xarelto 10 mg po daily, prophylactic dose following orthopedic surgeries, with the first dose in Am. 2. Orthopedic surgeon to determine length of prophylactic therapy. 3. Will follow CBC's, Platelets, and monitor for any bleeding complications.Velda Shell,  Pharm.D   07/15/2015,  10:40 PM

## 2015-07-15 NOTE — Anesthesia Preprocedure Evaluation (Signed)
Anesthesia Evaluation  Patient identified by MRN, date of birth, ID band Patient awake    Reviewed: Allergy & Precautions, NPO status , Patient's Chart, lab work & pertinent test results  History of Anesthesia Complications Negative for: history of anesthetic complications  Airway Mallampati: II  TM Distance: >3 FB Neck ROM: Full    Dental  (+) Partial Upper   Pulmonary neg shortness of breath, neg sleep apnea, COPD,  COPD inhaler, neg recent URI, Current Smoker,    breath sounds clear to auscultation       Cardiovascular hypertension, Pt. on medications (-) angina+CHF  (-) CAD and (-) Past MI  Rhythm:Regular     Neuro/Psych  Headaches, neg Seizures PSYCHIATRIC DISORDERS Anxiety Depression    GI/Hepatic Neg liver ROS, GERD  Medicated and Controlled,  Endo/Other  diabetes, Type 2, Oral Hypoglycemic Agents  Renal/GU negative Renal ROS     Musculoskeletal negative musculoskeletal ROS (+)   Abdominal   Peds  Hematology   Anesthesia Other Findings   Reproductive/Obstetrics                             Anesthesia Physical Anesthesia Plan  ASA: III  Anesthesia Plan: MAC and Regional   Post-op Pain Management:    Induction: Intravenous  Airway Management Planned: Nasal Cannula and Simple Face Mask  Additional Equipment: None  Intra-op Plan:   Post-operative Plan:   Informed Consent: I have reviewed the patients History and Physical, chart, labs and discussed the procedure including the risks, benefits and alternatives for the proposed anesthesia with the patient or authorized representative who has indicated his/her understanding and acceptance.   Dental advisory given  Plan Discussed with: CRNA and Surgeon  Anesthesia Plan Comments:         Anesthesia Quick Evaluation

## 2015-07-15 NOTE — Telephone Encounter (Signed)
Surgical clearance signed and faxed with confirmation received.  Left message for patient to call me at the office to reschedule her follow-up appointment with Dr. Elease Hashimoto.

## 2015-07-15 NOTE — Anesthesia Procedure Notes (Signed)
Anesthesia Regional Block:  Popliteal block  Pre-Anesthetic Checklist: ,, timeout performed, Correct Patient, Correct Site, Correct Laterality, Correct Procedure, Correct Position, site marked, Risks and benefits discussed,  Surgical consent,  Pre-op evaluation,  At surgeon's request and post-op pain management  Laterality: Lower and Right  Prep: chloraprep       Needles:  Injection technique: Single-shot  Needle Type: Echogenic Needle          Additional Needles:  Procedures: ultrasound guided (picture in chart) Popliteal block Narrative:  Injection made incrementally with aspirations every 5 mL.  Performed by: Personally   Additional Notes: H+P and labs reviewed, risks and benefits discussed with patient, procedure tolerated well without complications   Anesthesia Regional Block:  Adductor canal block  Pre-Anesthetic Checklist: ,, timeout performed, Correct Patient, Correct Site, Correct Laterality, Correct Procedure, Correct Position, site marked, Risks and benefits discussed,  Surgical consent,  Pre-op evaluation,  At surgeon's request and post-op pain management  Laterality: Lower and Right  Prep: chloraprep       Needles:  Injection technique: Single-shot  Needle Type: Echogenic Stimulator Needle          Additional Needles:  Procedures: ultrasound guided (picture in chart) Adductor canal block Narrative:  Injection made incrementally with aspirations every 5 mL.  Performed by: Personally  Anesthesiologist: Arias Weinert  Additional Notes: H+P and labs reviewed, risks and benefits discussed with patient, procedure tolerated well without complications

## 2015-07-15 NOTE — Op Note (Signed)
[name], CASABLANCA                ACCOUNT NO.:  [id number]  MEDICAL RECORD NO.:  [id number]  LOCATION:  [facility]                         FACILITY:  [facility]  PHYSICIAN:  Burnard Bunting, M.D.    DATE OF BIRTH:  Nov 19, 1960  DATE OF PROCEDURE:  07/15/2015 DATE OF DISCHARGE:                              OPERATIVE REPORT   PREOPERATIVE DIAGNOSIS:  Right ankle retained hardware and ankle infection.  POSTOPERATIVE DIAGNOSIS:  Right ankle retained hardware and ankle infection.  PROCEDURE:  Right ankle excisional debridement, removal of deep hardware, placement of stimulant antibiotic beads, placement of Prevena wound VAC.  SURGEON:  Burnard Bunting, M.D.  ASSISTANT:  Patrick Jupiter, RNFA.  INDICATIONS:  [name] is a patient, who had a right ankle fracture done approximately a year ago, presents now for hardware removal and debridement after persistent pin-hole drainage from deep hardware site and lateral incision.  SPECIMENS:  Cultures x1.  PROCEDURE IN DETAIL:  The patient was brought to operating room, where regional anesthetic was induced.  The ankle Esmarch utilized for approximately 15 minutes.  No exsanguination was performed.  The patient had a chronic-appearing granulation tissue over the midportion of the incision.  There was a punctate sinus tract.  At this time, incision was made using the prior incision.  Skin and subcutaneous tissues were sharply divided.  Cultures were obtained.  Areas of chronic inflammation were present diffusely in the midportion of the incision and the distal aspect of the incision.  Excisional debridement was performed.  Hardware was removed.  Thorough curettage of all exposed surfaces was performed. Screw holes were curetted.  At this time, thorough irrigation performed with 3 L of irrigating solution.  Stimulant antibiotic beads were then placed into the screw holes.  The remaining beads were powdered and put on the bone itself.  The ankle Esmarch was  released.  Bleeding points were encountered and controlled using electrocautery.  Incision closed using couple of 2-0 Vicryls and then 3-0 nylon.  Prevena wound VAC applied.  The patient will be weightbearing as tolerated.  Soft dressing applied.     Burnard Bunting, M.D.     GSD/MEDQ  D:  07/15/2015  T:  07/15/2015  Job:  [id number]

## 2015-07-15 NOTE — Anesthesia Postprocedure Evaluation (Signed)
Anesthesia Post Note  Patient: [name]  Procedure(s) Performed: Procedure(s) (LRB): RIGHT ANKLE HARDWARE REMOVAL, PLACEMENT OF STIMULAN ANTIBIOTIC BEADS AND PREVENA WOUND VAC. (Right)  Patient location during evaluation: PACU Anesthesia Type: MAC and Regional Level of consciousness: awake and alert Pain management: pain level controlled Vital Signs Assessment: post-procedure vital signs reviewed and stable Respiratory status: spontaneous breathing, nonlabored ventilation, respiratory function stable and patient connected to nasal cannula oxygen Cardiovascular status: stable and blood pressure returned to baseline Anesthetic complications: no    Last Vitals:  Filed Vitals:   07/15/15 2015 07/15/15 2020  BP:    Pulse: 84 82  Temp:    Resp: 17 17    Last Pain:  Filed Vitals:   07/15/15 2025  PainSc: 4                  Mickelle Goupil S

## 2015-07-15 NOTE — H&P (Signed)
[name] is an 55 y.o. female.   Chief Complaint: Right ankle infection HPI: [name] is a 55 year old patient with right ankle infection had ankle fracture fixation done by another physician approximately a year ago. Said some drainage from the ankle since that time. Presents now for operative management removal of hardware placement of antibiotic beads as well as culture.  Past Medical History  Diagnosis Date  . Hypertension   . Anxiety   . Gallstones   . GERD (gastroesophageal reflux disease)   . COPD (chronic obstructive pulmonary disease) ([name])   . Headache(784.0)     migraines  . Rash     arms  . Arthritis   . Depression   . Diabetes mellitus without complication ([name])     Type II  . Dysrhythmia     pt unsure of name of arrythmia - " my heart rate will drop all of a sudden" -no current treatment - atrial flutter    Past Surgical History  Procedure Laterality Date  . Ectopic pregnancy surgery  many yrs ago  . Carpal tunnel release      bil  . Cholecystectomy  11/24/2011    Procedure: LAPAROSCOPIC CHOLECYSTECTOMY WITH INTRAOPERATIVE CHOLANGIOGRAM;  Surgeon: Ebony Faster. Cornett, MD;  Location: [facility];  Service: General;  Laterality: N/A;  Laparoscopic Cholecystectomy with Cholangiogram  . Cardiac catheterization N/A 06/24/2015    Procedure: Left Heart Cath and Coronary Angiography;  Surgeon: Ebony Crome, MD;  Location: [facility];  Service: Cardiovascular;  Laterality: N/A;  . Ankle fracture surgery Right 2015    Family History  Problem Relation Age of Onset  . Breast cancer Sister   . Lung cancer Father    Social History:  reports that she has been smoking.  She has never used smokeless tobacco. She reports that she does not drink alcohol or use illicit drugs.  Allergies:  Allergies  Allergen Reactions  . Biaxin [Clarithromycin] Anaphylaxis and Swelling  . Clarithromycin Shortness Of Breath and Swelling  . Lisinopril Swelling and Cough    Lip edema  .  Sulfa Antibiotics Anaphylaxis, Shortness Of Breath, Swelling and Hypertension  . Chlorthalidone Nausea And Vomiting and Other (See Comments)    Clammy, Tachycardia, Headache   . Amoxicillin-Pot Clavulanate Diarrhea  . Aspirin Nausea And Vomiting and Rash  . Cholestyramine Nausea And Vomiting  . Dilaudid [Hydromorphone Hcl] Nausea And Vomiting  . Lasix [Furosemide] Nausea And Vomiting and Other (See Comments)    Headache   . Latex Hives and Rash    Medications Prior to Admission  Medication Sig Dispense Refill  . albuterol (PROVENTIL HFA;VENTOLIN HFA) 108 (90 BASE) MCG/ACT inhaler Inhale 2 puffs into the lungs every 4 (four) hours as needed for wheezing. Wheezing    . ALPRAZolam (XANAX) 0.5 MG tablet Take 1 mg by mouth 2 (two) times daily. Anxiety    . aspirin EC 81 MG tablet Take 81 mg by mouth daily.    . diphenhydrAMINE (BENADRYL) 25 MG tablet Take 25 mg by mouth every 6 (six) hours as needed for itching or allergies.    Marland Kitchen doxycycline (VIBRA-TABS) 100 MG tablet Take 100 mg by mouth 2 (two) times daily.    Marland Kitchen FLUoxetine (PROZAC) 40 MG capsule Take 40 mg by mouth daily with breakfast.     . glimepiride (AMARYL) 2 MG tablet Take 2 mg by mouth daily with breakfast.    . hydrOXYzine (ATARAX/VISTARIL) 10 MG tablet Take 20 mg by mouth every 6 (six)  hours as needed for itching.    Marland Kitchen omeprazole (PRILOSEC) 20 MG capsule Take 20 mg by mouth 2 (two) times daily.     Marland Kitchen oxyCODONE-acetaminophen (PERCOCET/ROXICET) 5-325 MG per tablet Take 1 tablet by mouth every 4 (four) hours as needed. (Patient taking differently: Take 1 tablet by mouth every 6 (six) hours as needed for moderate pain. ) 20 tablet 0  . pravastatin (PRAVACHOL) 40 MG tablet Take 40 mg by mouth daily.    . sitaGLIPtin (JANUVIA) 100 MG tablet Take 100 mg by mouth daily.    . sodium chloride (MURO 128) 5 % ophthalmic ointment Place 1 application into both eyes at bedtime.     Marland Kitchen tiotropium (SPIRIVA) 18 MCG inhalation capsule Place 18 mcg  into inhaler and inhale daily.    Marland Kitchen triamcinolone (KENALOG) 0.025 % cream Apply 1 application topically 2 (two) times daily.    . valsartan (DIOVAN) 160 MG tablet Take 160 mg by mouth daily with breakfast.     . hydrOXYzine (ATARAX/VISTARIL) 25 MG tablet Take 1 tablet (25 mg total) by mouth every 6 (six) hours as needed for itching. (Patient not taking: Reported on 07/14/2015) 15 tablet 0  . ibuprofen (ADVIL,MOTRIN) 800 MG tablet Take 1 tablet (800 mg total) by mouth 3 (three) times daily. (Patient not taking: Reported on 07/14/2015) 21 tablet 0  . imipramine (TOFRANIL) 25 MG tablet Take 50 mg by mouth at bedtime. Take 2 hours before bedtime    . loperamide (IMODIUM) 2 MG capsule Take 2 mg by mouth 2 (two) times daily.     . metFORMIN (GLUCOPHAGE) 1000 MG tablet Take 1,000 mg by mouth daily with breakfast.      Results for orders placed or performed during the hospital encounter of 07/15/15 (from the past 48 hour(s))  Glucose, capillary     Status: Abnormal   Collection Time: 07/15/15  3:15 PM  Result Value Ref Range   Glucose-Capillary 142 (H) 65 - 99 mg/dL  Basic metabolic panel     Status: Abnormal   Collection Time: 07/15/15  3:30 PM  Result Value Ref Range   Sodium 140 135 - 145 mmol/L   Potassium 3.9 3.5 - 5.1 mmol/L   Chloride 106 101 - 111 mmol/L   CO2 23 22 - 32 mmol/L   Glucose, Bld 156 (H) 65 - 99 mg/dL   BUN 8 6 - 20 mg/dL   Creatinine, Ser 0.58 0.44 - 1.00 mg/dL   Calcium 9.4 8.9 - 10.3 mg/dL   GFR calc non Af Amer >60 >60 mL/min   GFR calc Af Amer >60 >60 mL/min    Comment: (NOTE) The eGFR has been calculated using the CKD EPI equation. This calculation has not been validated in all clinical situations. eGFR's persistently <60 mL/min signify possible Chronic Kidney Disease.    Anion gap 11 5 - 15  CBC     Status: Abnormal   Collection Time: 07/15/15  3:30 PM  Result Value Ref Range   WBC 8.6 4.0 - 10.5 K/uL   RBC 5.16 (H) 3.87 - 5.11 MIL/uL   Hemoglobin 12.9 12.0 -  15.0 g/dL   HCT 40.3 36.0 - 46.0 %   MCV 78.1 78.0 - 100.0 fL   MCH 25.0 (L) 26.0 - 34.0 pg   MCHC 32.0 30.0 - 36.0 g/dL   RDW 16.6 (H) 11.5 - 15.5 %   Platelets 196 150 - 400 K/uL  Glucose, capillary     Status: Abnormal   Collection  Time: 07/15/15  5:22 PM  Result Value Ref Range   Glucose-Capillary 130 (H) 65 - 99 mg/dL   No results found.  Review of Systems  Constitutional: Negative.   HENT: Negative.   Eyes: Negative.   Cardiovascular: Negative.   Gastrointestinal: Negative.   Genitourinary: Negative.   Musculoskeletal: Positive for joint pain.  Skin: Negative.   Neurological: Negative.   Endo/Heme/Allergies: Negative.   Psychiatric/Behavioral: Negative.     Blood pressure 135/71, pulse 101, temperature 98 F (36.7 C), temperature source Oral, resp. rate 15, height 5' 7" (1.702 m), weight 78.019 kg (172 lb), last menstrual period 07/24/2011, SpO2 96 %. Physical Exam  Constitutional: She appears well-developed.  HENT:  Head: Normocephalic.  Eyes: Pupils are equal, round, and reactive to light.  Neck: Normal range of motion.  Cardiovascular: Normal rate.   Respiratory: Effort normal.  Neurological: She is alert.  Skin: Skin is warm.  Psychiatric: She has a normal mood and affect.   emanation the right ankle demonstrates good range of motion she has well-healed incision but there is 2 areas of punctate sinus tract drainage. This is worse when she stands. She has palpable pedal pulses intact sensation dorsum plantar aspect of the foot  Assessment/Plan Impression is healed right ankle fracture with punctate sinus tract drainage indicating deep infection plan is for hardware removal curettage of the bone placement of antibiotic beads placement of Prolene wound VAC risk benefits discussed with patient limited limited to infection infection which could be persistent as well as potential need for more surgery will obtain ID consult will likely place PICC line in the patient  for 6 weeks of IV antibiotics patient understands risk benefits wishes to proceed all questions answered  DEAN,GREGORY SCOTT 07/15/2015, 5:33 PM

## 2015-07-16 ENCOUNTER — Encounter (HOSPITAL_COMMUNITY): Payer: Self-pay | Admitting: Orthopedic Surgery

## 2015-07-16 DIAGNOSIS — M86671 Other chronic osteomyelitis, right ankle and foot: Secondary | ICD-10-CM | POA: Diagnosis not present

## 2015-07-16 DIAGNOSIS — Z9889 Other specified postprocedural states: Secondary | ICD-10-CM | POA: Diagnosis not present

## 2015-07-16 DIAGNOSIS — B9689 Other specified bacterial agents as the cause of diseases classified elsewhere: Secondary | ICD-10-CM

## 2015-07-16 DIAGNOSIS — T847XXA Infection and inflammatory reaction due to other internal orthopedic prosthetic devices, implants and grafts, initial encounter: Secondary | ICD-10-CM | POA: Diagnosis not present

## 2015-07-16 LAB — GLUCOSE, CAPILLARY
GLUCOSE-CAPILLARY: 268 mg/dL — AB (ref 65–99)
GLUCOSE-CAPILLARY: 148 mg/dL — AB (ref 65–99)
GLUCOSE-CAPILLARY: 140 mg/dL — AB (ref 65–99)
Glucose-Capillary: 121 mg/dL — ABNORMAL HIGH (ref 65–99)

## 2015-07-16 MED ORDER — LEVOFLOXACIN 750 MG PO TABS
750.0000 mg | ORAL_TABLET | Freq: Every day | ORAL | Status: DC
  Administered 2015-07-16: 750 mg via ORAL
  Filled 2015-07-16: qty 1

## 2015-07-16 MED ORDER — VANCOMYCIN HCL IN DEXTROSE 1-5 GM/200ML-% IV SOLN
1000.0000 mg | Freq: Three times a day (TID) | INTRAVENOUS | Status: DC
  Administered 2015-07-16 – 2015-07-17 (×3): 1000 mg via INTRAVENOUS
  Filled 2015-07-16 (×6): qty 200

## 2015-07-16 NOTE — Care Management Note (Addendum)
Case Management Note  Patient Details  Name: NYIEMA SKYBERG MRN: [id number] Date of Birth: 1960/12/15  Subjective/Objective:                    Action/Plan:  Place prescription for Vancomycin in [facility] order , if Dr August Saucer signs prescription for vancomycin is covered . Patient would then need prescription for Levaquin . Joyce Gross at Dr Diamantina [place] office aware.   [facility] can accept patient this evening if they have prescription by 1700. Dr August Saucer aware and will call ID. Expected Discharge Date:                  Expected Discharge Plan:  Home w Home Health Services  In-House Referral:     Discharge planning Services  CM Consult  Post Acute Care Choice:  Home Health Choice offered to:  Patient  DME Arranged:    DME Agency:     HH Arranged:  RN, IV Antibiotics HH Agency:  [facility]  Status of Service:  In process, will continue to follow  Medicare Important Message Given:    Date Medicare IM Given:    Medicare IM give by:    Date Additional Medicare IM Given:    Additional Medicare Important Message give by:     If discussed at Long Length of Stay Meetings, dates discussed:    Additional Comments:  Kingsley Plan, RN 07/16/2015, 3:21 PM

## 2015-07-16 NOTE — Progress Notes (Signed)
Orthopedic Tech Progress Note Patient Details:  [name] Sep 30, 1960 [id number]  Ortho Devices Ortho Device/Splint Location: rle Ortho Device/Splint Interventions: Application   Nikki Dom 07/16/2015, 7:49 AM

## 2015-07-16 NOTE — Progress Notes (Signed)
ANTIBIOTIC CONSULT NOTE - INITIAL  Pharmacy Consult for vancomycin Indication: wound infection  Allergies  Allergen Reactions  . Biaxin [Clarithromycin] Anaphylaxis and Swelling  . Clarithromycin Shortness Of Breath and Swelling  . Lisinopril Swelling and Cough    Lip edema  . Sulfa Antibiotics Anaphylaxis, Shortness Of Breath, Swelling and Hypertension  . Chlorthalidone Nausea And Vomiting and Other (See Comments)    Clammy, Tachycardia, Headache   . Amoxicillin-Pot Clavulanate Diarrhea  . Aspirin Nausea And Vomiting and Rash  . Cholestyramine Nausea And Vomiting  . Dilaudid [Hydromorphone Hcl] Nausea And Vomiting  . Lasix [Furosemide] Nausea And Vomiting and Other (See Comments)    Headache   . Latex Hives and Rash    Patient Measurements: Height:  (170.2 cm) Weight: 176 lb 14.4 oz (80.241 kg) IBW/kg (Calculated) : 61.6  Vital Signs: Temp: 98.6 F (37 C) (01/18 0646) Temp Source: Oral (01/17 2208) BP: 129/80 mmHg (01/18 0646) Pulse Rate: 93 (01/18 0646) Intake/Output from previous day: 01/17 0701 - 01/18 0700 In: 1419 [P.O.:420; I.V.:999] Out: -  Intake/Output from this shift:    Labs:  Recent Labs  07/15/15 1530  WBC 8.6  HGB 12.9  PLT 196  CREATININE 0.58   Estimated Creatinine Clearance: 88.6 mL/min (by C-G formula based on Cr of 0.58).  Medical History: Past Medical History  Diagnosis Date  . Hypertension   . Anxiety   . Gallstones   . GERD (gastroesophageal reflux disease)   . COPD (chronic obstructive pulmonary disease) (HCC)   . Headache(784.0)     migraines  . Rash     arms  . Arthritis   . Depression   . Diabetes mellitus without complication (HCC)     Type II  . Dysrhythmia     pt unsure of name of arrythmia - " my heart rate will drop all of a sudden" -no current treatment - atrial flutter    Medications:  Prescriptions prior to admission  Medication Sig Dispense Refill Last Dose  . albuterol (PROVENTIL HFA;VENTOLIN HFA)  108 (90 BASE) MCG/ACT inhaler Inhale 2 puffs into the lungs every 4 (four) hours as needed for wheezing. Wheezing   07/15/2015 at Unknown time  . ALPRAZolam (XANAX) 0.5 MG tablet Take 1 mg by mouth 2 (two) times daily. Anxiety   07/15/2015 at Unknown time  . aspirin EC 81 MG tablet Take 81 mg by mouth daily.   07/09/2015  . diphenhydrAMINE (BENADRYL) 25 MG tablet Take 25 mg by mouth every 6 (six) hours as needed for itching or allergies.   07/14/2015 at Unknown time  . doxycycline (VIBRA-TABS) 100 MG tablet Take 100 mg by mouth 2 (two) times daily.   07/15/2015 at Unknown time  . FLUoxetine (PROZAC) 40 MG capsule Take 40 mg by mouth daily with breakfast.    07/14/2015 at Unknown time  . glimepiride (AMARYL) 2 MG tablet Take 2 mg by mouth daily with breakfast.   07/14/2015 at Unknown time  . hydrOXYzine (ATARAX/VISTARIL) 10 MG tablet Take 20 mg by mouth every 6 (six) hours as needed for itching.   07/15/2015 at Unknown time  . omeprazole (PRILOSEC) 20 MG capsule Take 20 mg by mouth 2 (two) times daily.    07/15/2015 at Unknown time  . oxyCODONE-acetaminophen (PERCOCET/ROXICET) 5-325 MG per tablet Take 1 tablet by mouth every 4 (four) hours as needed. (Patient taking differently: Take 1 tablet by mouth every 6 (six) hours as needed for moderate pain. ) 20 tablet 0 07/14/2015 at  Unknown time  . pravastatin (PRAVACHOL) 40 MG tablet Take 40 mg by mouth daily.   07/15/2015 at Unknown time  . sitaGLIPtin (JANUVIA) 100 MG tablet Take 100 mg by mouth daily.   07/14/2015 at Unknown time  . sodium chloride (MURO 128) 5 % ophthalmic ointment Place 1 application into both eyes at bedtime.    07/14/2015 at Unknown time  . tiotropium (SPIRIVA) 18 MCG inhalation capsule Place 18 mcg into inhaler and inhale daily.   07/15/2015 at Unknown time  . triamcinolone (KENALOG) 0.025 % cream Apply 1 application topically 2 (two) times daily.   07/14/2015 at Unknown time  . valsartan (DIOVAN) 160 MG tablet Take 160 mg by mouth daily with  breakfast.    07/14/2015 at Unknown time  . hydrOXYzine (ATARAX/VISTARIL) 25 MG tablet Take 1 tablet (25 mg total) by mouth every 6 (six) hours as needed for itching. (Patient not taking: Reported on 07/14/2015) 15 tablet 0 Past Month at Unknown time  . ibuprofen (ADVIL,MOTRIN) 800 MG tablet Take 1 tablet (800 mg total) by mouth 3 (three) times daily. (Patient not taking: Reported on 07/14/2015) 21 tablet 0 ~ 1 month  . imipramine (TOFRANIL) 25 MG tablet Take 50 mg by mouth at bedtime. Take 2 hours before bedtime   More than a month at Unknown time  . loperamide (IMODIUM) 2 MG capsule Take 2 mg by mouth 2 (two) times daily.    06/23/2015 at Unknown time  . metFORMIN (GLUCOPHAGE) 1000 MG tablet Take 1,000 mg by mouth daily with breakfast.   06/23/2015 at Unknown time   Scheduled:  . acetaminophen      . ALPRAZolam  1 mg Oral BID  . FLUoxetine  40 mg Oral Q breakfast  . glimepiride  2 mg Oral Q breakfast  . imipramine  50 mg Oral QHS  . irbesartan  150 mg Oral Daily  . linagliptin  5 mg Oral Daily  . pantoprazole  40 mg Oral Daily  . pravastatin  40 mg Oral q1800  . rivaroxaban  10 mg Oral Q24H  . tiotropium  18 mcg Inhalation Daily   Infusions:  . 0.9 % NaCl with KCl 20 mEq / L 75 mL/hr at 07/16/15 0020    Assessment: 54yo female had ankle fx fixation ~37yr ago, c/o drainage from ankle since then, admitted for removal of hardware and placement of ABX beads, plan for 6wk of IV ABX.  Goal of Therapy:  Vancomycin trough level 15-20 mcg/ml  Plan:  Rec'd vanc 1g pre- and 1g post-operatively; will continue with vancomycin 1000mg  IV Q8H and monitor CBC, Cx, levels prn.  Note: If appropriate for lower goal trough after discharge, vanc can be changed to 1g IV Q12H for ease of administration.  Vernard Gambles, PharmD, BCPS  07/16/2015,7:31 AM

## 2015-07-16 NOTE — Consult Note (Addendum)
[facility]       Reason for Consult: osteomyelitis/hardware-based infection    Referring Physician: [name]  Active Problems:   Infection of bone of ankle (HCC)   . ALPRAZolam  1 mg Oral BID  . FLUoxetine  40 mg Oral Q breakfast  . glimepiride  2 mg Oral Q breakfast  . imipramine  50 mg Oral QHS  . irbesartan  150 mg Oral Daily  . linagliptin  5 mg Oral Daily  . pantoprazole  40 mg Oral Daily  . pravastatin  40 mg Oral q1800  . rivaroxaban  10 mg Oral Q24H  . tiotropium  18 mcg Inhalation Daily  . vancomycin  1,000 mg Intravenous Q8H    Recommendations: Continue with vancomcycin I will add oral levaquin 750 mg po daily Both for 6 weeks through February 28th Antibiotics per home health protocol; trough goal of 15-20 Twice weekly bmp, weekly cbc to [facility] We will arrange follow up in [facility] in 2-3 weeks.  Thanks for consult  Assessment: She has chronic draining wound following ankle fusion from fracture in 2015.  Has been draining for about 8 months and has been on doxycycline most of that time.  Consistent with chronic osteomyelitis.   Antibiotics: Vancomycin Previous doxycycline  HPI: [name] is a 55 y.o. female with DM, COPD, HTN who fractured her ankle in 2015 and about 8 months ago noted an opening, yellow/white drainage and was put on doxycycline.  Persisted.  Had increased pain and no improvement and saw [name] who took her to the OR yesterday for washout and removal of hardware.  No WBCs or organisms noted on gram stain. No purulence but did not chronic inflammation.  Complaining of some pain now.   Previous record of  Multiple ED visits independently reviewed, has been an ongoing problem  Review of Systems:  Constitutional: negative for fevers and chills Gastrointestinal: negative for diarrhea All other systems reviewed and are negative   Past Medical History  Diagnosis Date  . Hypertension   . Anxiety   . Gallstones    . GERD (gastroesophageal reflux disease)   . COPD (chronic obstructive pulmonary disease) (HCC)   . Headache(784.0)     migraines  . Rash     arms  . Arthritis   . Depression   . Diabetes mellitus without complication (HCC)     Type II  . Dysrhythmia     pt unsure of name of arrythmia - " my heart rate will drop all of a sudden" -no current treatment - atrial flutter    Social History  Substance Use Topics  . Smoking status: Current Every Day Smoker -- 0.50 packs/day for 20 years  . Smokeless tobacco: Never Used  . Alcohol Use: No    Family History  Problem Relation Age of Onset  . Breast cancer Sister   . Lung cancer Father     Allergies  Allergen Reactions  . Biaxin [Clarithromycin] Anaphylaxis and Swelling  . Clarithromycin Shortness Of Breath and Swelling  . Lisinopril Swelling and Cough    Lip edema  . Sulfa Antibiotics Anaphylaxis, Shortness Of Breath, Swelling and Hypertension  . Chlorthalidone Nausea And Vomiting and Other (See Comments)    Clammy, Tachycardia, Headache   . Amoxicillin-Pot Clavulanate Diarrhea  . Aspirin Nausea And Vomiting and Rash  . Cholestyramine Nausea And Vomiting  . Dilaudid [Hydromorphone Hcl] Nausea And Vomiting  . Lasix [Furosemide] Nausea And Vomiting and  Other (See Comments)    Headache   . Latex Hives and Rash    Physical Exam: Constitutional: in no apparent distress and alert  Filed Vitals:   07/16/15 0646 07/16/15 1436  BP: 129/80 122/76  Pulse: 93 92  Temp: 98.6 F (37 C) 98.2 F (36.8 C)  Resp: 18 18   EYES: anicteric ENMT: no thrush Cardiovascular: Cor RRR Respiratory: CTA B; normal respiratory effort GI: Bowel sounds are normal, liver is not enlarged, spleen is not enlarged Musculoskeletal: left leg in boot cast Skin: negatives: no rash Hematologic: no cervical lad  Lab Results  Component Value Date   WBC 8.6 07/15/2015   HGB 12.9 07/15/2015   HCT 40.3 07/15/2015   MCV 78.1 07/15/2015   PLT 196  07/15/2015    Lab Results  Component Value Date   CREATININE 0.58 07/15/2015   BUN 8 07/15/2015   NA 140 07/15/2015   K 3.9 07/15/2015   CL 106 07/15/2015   CO2 23 07/15/2015    Lab Results  Component Value Date   ALT 34* 06/03/2015   AST 26 06/03/2015   ALKPHOS 119 06/03/2015     Microbiology: Recent Results (from the past 240 hour(s))  Anaerobic culture     Status: None (Preliminary result)   Collection Time: 07/15/15  6:50 PM  Result Value Ref Range Status   Specimen Description TISSUE RIGHT ANKLE  Final   Special Requests NONE  Final   Gram Stain   Final    NO WBC SEEN NO ORGANISMS SEEN Performed at [facility]    Culture PENDING  Incomplete   Report Status PENDING  Incomplete  Tissue culture     Status: None (Preliminary result)   Collection Time: 07/15/15  6:50 PM  Result Value Ref Range Status   Specimen Description TISSUE RIGHT ANKLE  Final   Special Requests NONE  Final   Gram Stain   Final    NO WBC SEEN NO ORGANISMS SEEN Performed at [facility]    Culture PENDING  Incomplete   Report Status PENDING  Incomplete  AFB culture with smear     Status: None (Preliminary result)   Collection Time: 07/15/15  6:50 PM  Result Value Ref Range Status   Specimen Description TISSUE RIGHT ANKLE  Final   Special Requests NONE  Final   Acid Fast Smear   Final    NO ACID FAST BACILLI SEEN Performed at [facility]    Culture   Final    CULTURE WILL BE EXAMINED FOR 6 WEEKS BEFORE ISSUING A FINAL REPORT Performed at [facility]    Report Status PENDING  Incomplete    COMER, Molly Maduro, MD [email or web address] [phone] pager  [phone] cell 07/16/2015, 3:31 PM

## 2015-07-16 NOTE — Discharge Instructions (Signed)

## 2015-07-16 NOTE — Progress Notes (Signed)
Peripherally Inserted Central Catheter/Midline Placement  The IV Nurse has discussed with the patient and/or persons authorized to consent for the patient, the purpose of this procedure and the potential benefits and risks involved with this procedure.  The benefits include less needle sticks, lab draws from the catheter and patient may be discharged home with the catheter.  Risks include, but not limited to, infection, bleeding, blood clot (thrombus formation), and puncture of an artery; nerve damage and irregular heat beat.  Alternatives to this procedure were also discussed.  PICC/Midline Placement Documentation  PICC Single Lumen 07/16/15 PICC Right Basilic 41 cm 1 cm (Active)  Indication for Insertion or Continuance of Line Prolonged intravenous therapies;Home intravenous therapies (PICC only) 07/16/2015  2:31 PM  Exposed Catheter (cm) 1 cm 07/16/2015  2:31 PM  Dressing Change Due 07/23/15 07/16/2015  2:31 PM    Consent obtained by Reginia Forts RN   Consuello Masse 07/16/2015, 2:32 PM

## 2015-07-16 NOTE — Progress Notes (Signed)
Subjective: Pt stable - pain ok   Objective: Vital signs in last 24 hours: Temp:  [98 F (36.7 C)-98.9 F (37.2 C)] 98.6 F (37 C) (01/18 0646) Pulse Rate:  [82-101] 93 (01/18 0646) Resp:  [14-22] 18 (01/18 0646) BP: (106-138)/(70-87) 129/80 mmHg (01/18 0646) SpO2:  [93 %-100 %] 94 % (01/18 0646) Weight:  [78.019 kg (172 lb)-80.241 kg (176 lb 14.4 oz)] 80.241 kg (176 lb 14.4 oz) (01/17 2208)  Intake/Output from previous day: 01/17 0701 - 01/18 0700 In: 1419 [P.O.:420; I.V.:999] Out: -  Intake/Output this shift:    Exam:  Sensation intact distally Intact pulses distally  Labs:  Recent Labs  07/15/15 1530  HGB 12.9    Recent Labs  07/15/15 1530  WBC 8.6  RBC 5.16*  HCT 40.3  PLT 196    Recent Labs  07/15/15 1530  NA 140  K 3.9  CL 106  CO2 23  BUN 8  CREATININE 0.58  GLUCOSE 156*  CALCIUM 9.4   No results for input(s): LABPT, INR in the last 72 hours.  Assessment/Plan: Plan dc today after ID consult and pic line   DEAN,GREGORY SCOTT 07/16/2015, 7:17 AM

## 2015-07-16 NOTE — Progress Notes (Signed)
Inpatient Diabetes Program Recommendations  AACE/ADA: New Consensus Statement on Inpatient Glycemic Control (2015)  Target Ranges:  Prepandial:   less than 140 mg/dL      Peak postprandial:   less than 180 mg/dL (1-2 hours)      Critically ill patients:  140 - 180 mg/dL   Review of Glycemic Control:  Results for NAIBE, KRACHT (MRN [id number]) as of 07/16/2015 14:08  Ref. Range 07/15/2015 17:22 07/15/2015 19:39 07/16/2015 09:06 07/16/2015 12:22  Glucose-Capillary Latest Ref Range: 65-99 mg/dL 466 (H) 599 (H) 357 (H) 148 (H)    Diabetes history: Type 2 diabetes Outpatient Diabetes medications: Metformin 1000 mg daily, Januvia 100 mg daily, Amaryl 2 mg daily Current orders for Inpatient glycemic control:  Amaryl 2 mg daily, Tradjenta 5 mg daily Inpatient Diabetes Program Recommendations:    Please consider adding Novolog moderate correction tid with meals and HS while patient is in the hospital.  Also please check A1C to determine pre-hospitalization glycemic control.  Thanks, Beryl Meager, RN, BC-ADM Inpatient Diabetes Coordinator Pager [phone] (8a-5p)

## 2015-07-16 NOTE — Progress Notes (Signed)
[facility]  Patient Status:   New pt for [facility] this hospital admission  [facility] is providing the following services:  HHRN and Home Infusion Pharmacy team for home IV Vancomycin. In hospital hands on teaching regarding set up and administration of IV Vancomycin provided tonight with husband.  He did well and will be independent at home.  [facility] will follow Ms. Parkinson until ready for DC to support the transition home.   If patient discharges after hours, please call [phone].   Sedalia Muta 07/16/2015, 8:57 PM

## 2015-07-16 NOTE — Evaluation (Signed)
Physical Therapy Evaluation/Discharge Patient Details Name: [name] MRN: [id number] DOB: [birth year] Today's Date: 07/16/2015   History of Present Illness  55 y.o. female admitted to [facility] on 07/15/15 s/p R ankle infection with removal of hardware, placement of antibiotic beads and wound vac.  Pt with significant PMHx of HTN, anxiety, COPD, depression, DM, R ankle fx surgery 2015.    Clinical Impression  Pt is moving well with the assist of RW.  Education completed and she will have her fiancee's assist at discharge.  PT to sign off.     Follow Up Recommendations No PT follow up    Equipment Recommendations  3in1 (PT)    Recommendations for Other Services   NA    Precautions / Restrictions Precautions Required Braces or Orthoses: Other Brace/Splint Other Brace/Splint: CAM boot, wound vac Restrictions Weight Bearing Restrictions: Yes RLE Weight Bearing: Weight bearing as tolerated (in CAM boot)      Mobility  Bed Mobility Overal bed mobility: Modified Independent                Transfers Overall transfer level: Modified independent Equipment used: Rolling walker (2 wheeled)             General transfer comment: used RW to push up  Ambulation/Gait Ambulation/Gait assistance: Modified independent (Device/Increase time) Ambulation Distance (Feet): 75 Feet Assistive device: Rolling walker (2 wheeled) Gait Pattern/deviations: Step-to pattern;Antalgic Gait velocity: decreased Gait velocity interpretation: Below normal speed for age/gender General Gait Details: step to pattern with significant unweighting of right leg with arms.  Educated on wearing shoe of equal height to boot to help level her pelvis/legs out         Balance Overall balance assessment: Modified Independent                                           Pertinent Vitals/Pain Pain Assessment: 0-10 Pain Score: 4  Pain Location: right ankle Pain Descriptors / Indicators:  Constant Pain Intervention(s): Limited activity within patient's tolerance;Monitored during session;Repositioned    Home Living Family/patient expects to be discharged to:: Private residence Living Arrangements: Spouse/significant other (fiancee) Available Help at Discharge: Family;Available 24 hours/day Type of Home: House Home Access: Level entry (if she goes in through the garage) Entrance Stairs-Rails: None   Home Layout: One level Home Equipment: Environmental consultant - 2 wheels;Cane - single point      Prior Function Level of Independence: Independent               Hand Dominance   Dominant Hand: Right    Extremity/Trunk Assessment   Upper Extremity Assessment: Overall WFL for tasks assessed           Lower Extremity Assessment: RLE deficits/detail RLE Deficits / Details: right leg with normal post op pain and weakness, pt able to feel and wiggle her toes on this side.  Knee and hip strength intact for tasks assessed.     Cervical / Trunk Assessment: Normal  Communication   Communication: No difficulties  Cognition Arousal/Alertness: Awake/alert Behavior During Therapy: WFL for tasks assessed/performed Overall Cognitive Status: Within Functional Limits for tasks assessed                               Assessment/Plan    PT Assessment Patent does not need any further PT services  PT Diagnosis Difficulty walking;Abnormality of gait;Generalized weakness;Acute pain         PT Goals (Current goals can be found in the Care Plan section) Acute Rehab PT Goals Patient Stated Goal: to go home tomorrow PT Goal Formulation: All assessment and education complete, DC therapy               End of Session Equipment Utilized During Treatment: Other (comment) (CAM boot, wound vac) Activity Tolerance: Patient tolerated treatment well Patient left: in chair;with call bell/phone within reach Nurse Communication: Mobility status;Other (comment) (ok for pt and fiancee  to walk in the halls together)    Functional Assessment Tool Used: assist level Functional Limitation: Mobility: Walking and moving around Mobility: Walking and Moving Around Current Status (G9201): At least 1 percent but less than 20 percent impaired, limited or restricted Mobility: Walking and Moving Around Goal Status [phone]): At least 1 percent but less than 20 percent impaired, limited or restricted Mobility: Walking and Moving Around Discharge Status [phone]): At least 1 percent but less than 20 percent impaired, limited or restricted    Time: 1153-1218 PT Time Calculation (min) (ACUTE ONLY): 25 min   Charges:   PT Evaluation $PT Eval Moderate Complexity: 1 Procedure PT Treatments $Gait Training: 8-22 mins   PT G Codes:   PT G-Codes **NOT FOR INPATIENT CLASS** Functional Assessment Tool Used: assist level Functional Limitation: Mobility: Walking and moving around Mobility: Walking and Moving Around Current Status (I3254): At least 1 percent but less than 20 percent impaired, limited or restricted Mobility: Walking and Moving Around Goal Status [phone]): At least 1 percent but less than 20 percent impaired, limited or restricted Mobility: Walking and Moving Around Discharge Status [phone]): At least 1 percent but less than 20 percent impaired, limited or restricted    [name], PT, DPT [phone]   07/16/2015, 7:03 PM

## 2015-07-17 DIAGNOSIS — T847XXA Infection and inflammatory reaction due to other internal orthopedic prosthetic devices, implants and grafts, initial encounter: Secondary | ICD-10-CM | POA: Diagnosis not present

## 2015-07-17 LAB — GLUCOSE, CAPILLARY: Glucose-Capillary: 200 mg/dL — ABNORMAL HIGH (ref 65–99)

## 2015-07-17 MED ORDER — HEPARIN SOD (PORK) LOCK FLUSH 100 UNIT/ML IV SOLN
250.0000 [IU] | INTRAVENOUS | Status: AC | PRN
  Administered 2015-07-17: 250 [IU]

## 2015-07-17 MED ORDER — SODIUM CHLORIDE 0.9 % IJ SOLN
10.0000 mL | INTRAMUSCULAR | Status: DC | PRN
  Administered 2015-07-17: 10 mL
  Filled 2015-07-17: qty 40

## 2015-07-17 MED ORDER — VANCOMYCIN HCL IN DEXTROSE 1-5 GM/200ML-% IV SOLN: 1000.0000 mg | Freq: Three times a day (TID) | INTRAVENOUS | Status: DC

## 2015-07-17 MED ORDER — LEVOFLOXACIN 750 MG PO TABS: 750.0000 mg | ORAL_TABLET | Freq: Every day | ORAL | Status: DC

## 2015-07-17 NOTE — Progress Notes (Signed)
Husband concerned that [facility] doesn't know pt is being discharged. Called and confirmed that representative from [facility] will administer 1800 dose of Vanc tonight.

## 2015-07-17 NOTE — Telephone Encounter (Signed)
Left detailed message on patient's voice mail regarding follow-up appointment on 1/27 and to call with questions or concerns.

## 2015-07-17 NOTE — Discharge Planning (Signed)
Patient discharged home in stable condition. Verbalizes understanding of all discharge instructions, including home medications and follow up appointments. 

## 2015-07-18 LAB — TISSUE CULTURE: Gram Stain: NONE SEEN

## 2015-07-18 NOTE — Discharge Summary (Signed)
Physician Discharge Summary  Patient ID: JALAN BODI MRN: [id number] DOB/AGE: 30-Aug-1960 55 y.o.  Admit date: 07/15/2015 Discharge date: 07/17/2015  Admission Diagnoses:  Active Problems:   Infection of bone of ankle (HCC)   Discharge Diagnoses:  Same  Surgeries: Procedure(s): RIGHT ANKLE HARDWARE REMOVAL, PLACEMENT OF STIMULAN ANTIBIOTIC BEADS AND PREVENA WOUND VAC. on 07/15/2015   Consultants:    Discharged Condition: Stable  Hospital Course: [name] is an 55 y.o. female who was admitted 07/15/2015 with a chief complaint of right ankle pain, and found to have a diagnosis of right ankle infection.  They were brought to the operating room on 07/15/2015 and underwent the above named procedures. She was seen by ID and pic line placed started PT for rom and therapy. Discharged with provena wound vac in good condition   Antibiotics given:  Anti-infectives    Start     Dose/Rate Route Frequency Ordered Stop   07/17/15 0000  levofloxacin (LEVAQUIN) 750 MG tablet     750 mg Oral Daily 07/17/15 0803     07/17/15 0000  vancomycin (VANCOCIN) 1 GM/200ML SOLN     1,000 mg 200 mL/hr over 60 Minutes Intravenous Every 8 hours 07/17/15 0803     07/16/15 1545  levofloxacin (LEVAQUIN) tablet 750 mg  Status:  Discontinued     750 mg Oral Daily 07/16/15 1543 07/17/15 1206   07/16/15 1400  vancomycin (VANCOCIN) IVPB 1000 mg/200 mL premix  Status:  Discontinued     1,000 mg 200 mL/hr over 60 Minutes Intravenous Every 8 hours 07/16/15 0736 07/17/15 1206   07/16/15 0630  vancomycin (VANCOCIN) IVPB 1000 mg/200 mL premix     1,000 mg 200 mL/hr over 60 Minutes Intravenous Every 12 hours 07/15/15 2210 07/16/15 0642   07/16/15 0600  clindamycin (CLEOCIN) IVPB 900 mg  Status:  Discontinued     900 mg 100 mL/hr over 30 Minutes Intravenous On call to O.R. 07/15/15 1508 07/15/15 2156   07/15/15 1843  vancomycin (VANCOCIN) powder  Status:  Discontinued       As needed 07/15/15 1843 07/15/15 1920   07/15/15 1841  gentamicin (GARAMYCIN) injection  Status:  Discontinued       As needed 07/15/15 1842 07/15/15 1920    .  Recent vital signs:  Filed Vitals:   07/16/15 2225 07/17/15 0531  BP: 127/82 126/88  Pulse: 92 94  Temp: 98.3 F (36.8 C) 98.2 F (36.8 C)  Resp: 18 19    Recent laboratory studies:  Results for orders placed or performed during the hospital encounter of 07/15/15  Anaerobic culture  Result Value Ref Range   Specimen Description TISSUE RIGHT ANKLE    Special Requests NONE    Gram Stain      NO WBC SEEN NO ORGANISMS SEEN Performed at [facility]    Culture      NO ANAEROBES ISOLATED; CULTURE IN PROGRESS FOR 5 DAYS Performed at [facility]    Report Status PENDING   Tissue culture  Result Value Ref Range   Specimen Description TISSUE RIGHT ANKLE    Special Requests NONE    Gram Stain      NO WBC SEEN NO ORGANISMS SEEN Performed at [facility]    Culture      Culture reincubated for better growth Performed at [facility]    Report Status PENDING   AFB culture with smear  Result Value Ref Range   Specimen Description TISSUE RIGHT ANKLE  Special Requests NONE    Acid Fast Smear      NO ACID FAST BACILLI SEEN Performed at [facility]    Culture      CULTURE WILL BE EXAMINED FOR 6 WEEKS BEFORE ISSUING A FINAL REPORT Performed at [facility]    Report Status PENDING   Basic metabolic panel  Result Value Ref Range   Sodium 140 135 - 145 mmol/L   Potassium 3.9 3.5 - 5.1 mmol/L   Chloride 106 101 - 111 mmol/L   CO2 23 22 - 32 mmol/L   Glucose, Bld 156 (H) 65 - 99 mg/dL   BUN 8 6 - 20 mg/dL   Creatinine, Ser 1.95 0.44 - 1.00 mg/dL   Calcium 9.4 8.9 - 09.3 mg/dL   GFR calc non Af Amer >60 >60 mL/min   GFR calc Af Amer >60 >60 mL/min   Anion gap 11 5 - 15  CBC  Result Value Ref Range   WBC 8.6 4.0 - 10.5 K/uL   RBC 5.16 (H) 3.87 - 5.11 MIL/uL   Hemoglobin 12.9 12.0 - 15.0 g/dL    HCT 26.7 12.4 - 58.0 %   MCV 78.1 78.0 - 100.0 fL   MCH 25.0 (L) 26.0 - 34.0 pg   MCHC 32.0 30.0 - 36.0 g/dL   RDW 99.8 (H) 33.8 - 25.0 %   Platelets 196 150 - 400 K/uL  Glucose, capillary  Result Value Ref Range   Glucose-Capillary 142 (H) 65 - 99 mg/dL  Glucose, capillary  Result Value Ref Range   Glucose-Capillary 130 (H) 65 - 99 mg/dL  Glucose, capillary  Result Value Ref Range   Glucose-Capillary 165 (H) 65 - 99 mg/dL  Glucose, capillary  Result Value Ref Range   Glucose-Capillary 268 (H) 65 - 99 mg/dL   Comment 1 Notify RN    Comment 2 Document in Chart   Glucose, capillary  Result Value Ref Range   Glucose-Capillary 148 (H) 65 - 99 mg/dL  Glucose, capillary  Result Value Ref Range   Glucose-Capillary 121 (H) 65 - 99 mg/dL   Comment 1 Notify RN   Glucose, capillary  Result Value Ref Range   Glucose-Capillary 140 (H) 65 - 99 mg/dL   Comment 1 Notify RN    Comment 2 Document in Chart   Glucose, capillary  Result Value Ref Range   Glucose-Capillary 200 (H) 65 - 99 mg/dL   Comment 1 Repeat Test     Discharge Medications:     Medication List    STOP taking these medications        aspirin EC 81 MG tablet     doxycycline 100 MG tablet  Commonly known as:  VIBRA-TABS     ibuprofen 800 MG tablet  Commonly known as:  ADVIL,MOTRIN     oxyCODONE-acetaminophen 5-325 MG tablet  Commonly known as:  PERCOCET/ROXICET      TAKE these medications        albuterol 108 (90 Base) MCG/ACT inhaler  Commonly known as:  PROVENTIL HFA;VENTOLIN HFA  Inhale 2 puffs into the lungs every 4 (four) hours as needed for wheezing. Wheezing     ALPRAZolam 0.5 MG tablet  Commonly known as:  XANAX  Take 1 mg by mouth 2 (two) times daily. Anxiety     diphenhydrAMINE 25 MG tablet  Commonly known as:  BENADRYL  Take 25 mg by mouth every 6 (six) hours as needed for itching or allergies.     FLUoxetine  40 MG capsule  Commonly known as:  PROZAC  Take 40 mg by mouth daily with  breakfast.     glimepiride 2 MG tablet  Commonly known as:  AMARYL  Take 2 mg by mouth daily with breakfast.     hydrOXYzine 10 MG tablet  Commonly known as:  ATARAX/VISTARIL  Take 20 mg by mouth every 6 (six) hours as needed for itching.     imipramine 25 MG tablet  Commonly known as:  TOFRANIL  Take 50 mg by mouth at bedtime. Take 2 hours before bedtime     levofloxacin 750 MG tablet  Commonly known as:  LEVAQUIN  Take 1 tablet (750 mg total) by mouth daily.     loperamide 2 MG capsule  Commonly known as:  IMODIUM  Take 2 mg by mouth 2 (two) times daily.     metFORMIN 1000 MG tablet  Commonly known as:  GLUCOPHAGE  Take 1,000 mg by mouth daily with breakfast.     MURO 128 5 % ophthalmic ointment  Generic drug:  sodium chloride  Place 1 application into both eyes at bedtime.     omeprazole 20 MG capsule  Commonly known as:  PRILOSEC  Take 20 mg by mouth 2 (two) times daily.     pravastatin 40 MG tablet  Commonly known as:  PRAVACHOL  Take 40 mg by mouth daily.     sitaGLIPtin 100 MG tablet  Commonly known as:  JANUVIA  Take 100 mg by mouth daily.     tiotropium 18 MCG inhalation capsule  Commonly known as:  SPIRIVA  Place 18 mcg into inhaler and inhale daily.     triamcinolone 0.025 % cream  Commonly known as:  KENALOG  Apply 1 application topically 2 (two) times daily.     valsartan 160 MG tablet  Commonly known as:  DIOVAN  Take 160 mg by mouth daily with breakfast.     vancomycin 1 GM/200ML Soln  Commonly known as:  VANCOCIN  Inject 200 mLs (1,000 mg total) into the vein every 8 (eight) hours.        Diagnostic Studies: No results found.  Disposition: 01-Home or Self Care      Discharge Instructions    Call MD / Call 911    Complete by:  As directed   If you experience chest pain or shortness of breath, CALL 911 and be transported to the hospital emergency room.  If you develope a fever above 101 F, pus (white drainage) or increased drainage or  redness at the wound, or calf pain, call your surgeon's office.     Call MD / Call 911    Complete by:  As directed   If you experience chest pain or shortness of breath, CALL 911 and be transported to the hospital emergency room.  If you develope a fever above 101 F, pus (white drainage) or increased drainage or redness at the wound, or calf pain, call your surgeon's office.     Call MD / Call 911    Complete by:  As directed   If you experience chest pain or shortness of breath, CALL 911 and be transported to the hospital emergency room.  If you develope a fever above 101 F, pus (white drainage) or increased drainage or redness at the wound, or calf pain, call your surgeon's office.     Constipation Prevention    Complete by:  As directed   Drink plenty of fluids.  Prune juice  may be helpful.  You may use a stool softener, such as Colace (over the counter) 100 mg twice a day.  Use MiraLax (over the counter) for constipation as needed.     Constipation Prevention    Complete by:  As directed   Drink plenty of fluids.  Prune juice may be helpful.  You may use a stool softener, such as Colace (over the counter) 100 mg twice a day.  Use MiraLax (over the counter) for constipation as needed.     Constipation Prevention    Complete by:  As directed   Drink plenty of fluids.  Prune juice may be helpful.  You may use a stool softener, such as Colace (over the counter) 100 mg twice a day.  Use MiraLax (over the counter) for constipation as needed.     Diet - low sodium heart healthy    Complete by:  As directed      Diet - low sodium heart healthy    Complete by:  As directed      Diet - low sodium heart healthy    Complete by:  As directed      Discharge instructions    Complete by:  As directed   Ok to weight bear in fracture boot Keep incision dry Use percocet for pain and xarelto for blood clot prevention     Increase activity slowly as tolerated    Complete by:  As directed      Increase  activity slowly as tolerated    Complete by:  As directed      Increase activity slowly as tolerated    Complete by:  As directed               Signed: Milinda Sweeney SCOTT 07/18/2015, 12:59 PM

## 2015-07-20 LAB — ANAEROBIC CULTURE: Gram Stain: NONE SEEN

## 2015-07-24 ENCOUNTER — Other Ambulatory Visit: Payer: Self-pay | Admitting: *Deleted

## 2015-07-25 ENCOUNTER — Ambulatory Visit (INDEPENDENT_AMBULATORY_CARE_PROVIDER_SITE_OTHER): Payer: Medicaid Other | Admitting: Cardiovascular Disease

## 2015-07-25 ENCOUNTER — Encounter: Payer: Self-pay | Admitting: Cardiovascular Disease

## 2015-07-25 VITALS — BP 120/90 | HR 98 | Ht 67.0 in | Wt 171.0 lb

## 2015-07-25 DIAGNOSIS — I10 Essential (primary) hypertension: Secondary | ICD-10-CM

## 2015-07-25 DIAGNOSIS — I5022 Chronic systolic (congestive) heart failure: Secondary | ICD-10-CM

## 2015-07-25 MED ORDER — ASPIRIN EC 81 MG PO TBEC: 81.0000 mg | DELAYED_RELEASE_TABLET | Freq: Every day | ORAL | Status: DC

## 2015-07-25 MED ORDER — BISOPROLOL FUMARATE 5 MG PO TABS: 2.5000 mg | ORAL_TABLET | Freq: Every day | ORAL | Status: DC

## 2015-07-25 NOTE — Patient Instructions (Addendum)
Medication Instructions:  START Bisoprolol 2.5 mg once daily DECREASE Aspirin to 81 mg once daily  Labwork: None Ordered   Testing/Procedures: None Ordered   Follow-Up: Your physician recommends that you schedule a follow-up appointment in: 6 weeks with Dr. Elease Hashimoto.    If you need a refill on your cardiac medications before your next appointment, please call your pharmacy.   Thank you for choosing [facility]! Eligha Bridegroom, RN [phone]

## 2015-07-25 NOTE — Progress Notes (Signed)
Cardiology Office Note   Date:  07/25/2015   ID:  [name], DOB 09/20/1960, MRN [id number]  PCP:  Ebony Carney, MD  Cardiologist:   Ebony Mixer, MD   No chief complaint on file.   problem list 1. Chronic systolic congestive heart failure 2. Osteomyelitis of the right foot 3. Essential hypertension 4. COPD   History of Present Illness: Dec. 6, 2016:  [name] is a 55 y.o. female who presents for left arm tingling, heaviness Associated with left chest pressure - pushing sensation  Lasts from 2-4 minutes.   Occurs spontansously .  - at rest and exertion . Walks on occasion - not necessarily associated with walking  Have been going in since Oct.   Went to the ER on Oct. 12.   Saw [facility] in 2010, Wore a monitor - showed some fluttering - she does not recall what it reported.  Had a cath in 2010 for similar issues.    Results and images are not found in epic.    Smokes - 5 cigarettes a day . No ETOH Father had CABG at age 58  Jul 25, 2015:  [name] is seen back  For follow-up of her congestive heart failure. She had a cardiac catheterization which revealed no significant CAD.  her ejection fraction is between 20 and 30%. She's had surgery on her right foot to remove hardware that was placed during a previous surgery.  She has osteomyelitis of her right foot. She has a PICC line and gets antibiotics twice  a day.  No CP  Breathing is ok. Still has palpitations .     Past Medical History  Diagnosis Date  . Hypertension   . Anxiety   . Gallstones   . GERD (gastroesophageal reflux disease)   . COPD (chronic obstructive pulmonary disease) (HCC)   . Headache(784.0)     migraines  . Rash     arms  . Arthritis   . Depression   . Diabetes mellitus without complication (HCC)     Type II  . Dysrhythmia     pt unsure of name of arrythmia - " my heart rate will drop all of a sudden" -no current treatment - atrial flutter    Past Surgical  History  Procedure Laterality Date  . Ectopic pregnancy surgery  many yrs ago  . Carpal tunnel release      bil  . Cholecystectomy  11/24/2011    Procedure: LAPAROSCOPIC CHOLECYSTECTOMY WITH INTRAOPERATIVE CHOLANGIOGRAM;  Surgeon: Clovis Pu. Cornett, MD;  Location: [facility] ORS;  Service: General;  Laterality: N/A;  Laparoscopic Cholecystectomy with Cholangiogram  . Cardiac catheterization N/A 06/24/2015    Procedure: Left Heart Cath and Coronary Angiography;  Surgeon: [name], MD;  Location: [facility] INVASIVE CV LAB;  Service: Cardiovascular;  Laterality: N/A;  . Ankle fracture surgery Right 2015  . Hardware removal Right 07/15/2015    Procedure: RIGHT ANKLE HARDWARE REMOVAL, PLACEMENT OF STIMULAN ANTIBIOTIC BEADS AND PREVENA WOUND VAC.;  Surgeon: [name], MD;  Location: [facility] OR;  Service: Orthopedics;  Laterality: Right;     Current Outpatient Prescriptions  Medication Sig Dispense Refill  . albuterol (PROVENTIL HFA;VENTOLIN HFA) 108 (90 BASE) MCG/ACT inhaler Inhale 2 puffs into the lungs every 4 (four) hours as needed for wheezing. Wheezing    . ALPRAZolam (XANAX) 0.5 MG tablet Take 1 mg by mouth 2 (two) times daily. Anxiety    . aspirin 325 MG tablet Take 325 mg  by mouth daily.    . diphenhydrAMINE (BENADRYL) 25 MG tablet Take 25 mg by mouth every 6 (six) hours as needed for itching or allergies.    Marland Kitchen FLUoxetine (PROZAC) 40 MG capsule Take 40 mg by mouth daily with breakfast.     . glimepiride (AMARYL) 2 MG tablet Take 2 mg by mouth daily with breakfast.    . hydrOXYzine (ATARAX/VISTARIL) 10 MG tablet Take 20 mg by mouth every 6 (six) hours as needed for itching.    Marland Kitchen imipramine (TOFRANIL) 25 MG tablet Take 50 mg by mouth at bedtime. Take 2 hours before bedtime    . levofloxacin (LEVAQUIN) 750 MG tablet Take 1 tablet (750 mg total) by mouth daily. 45 tablet 0  . loperamide (IMODIUM) 2 MG capsule Take 2 mg by mouth 2 (two) times daily.     . metFORMIN (GLUCOPHAGE) 1000 MG tablet Take  1,000 mg by mouth daily with breakfast.    . omeprazole (PRILOSEC) 20 MG capsule Take 20 mg by mouth 2 (two) times daily.     Marland Kitchen oxyCODONE-acetaminophen (PERCOCET) 10-325 MG tablet Take 1 tablet by mouth every 6 (six) hours as needed. AS NEEDED FOR PAIN FROM SURGERY    . pravastatin (PRAVACHOL) 40 MG tablet Take 40 mg by mouth daily.    . sitaGLIPtin (JANUVIA) 100 MG tablet Take 100 mg by mouth daily.    . sodium chloride (MURO 128) 5 % ophthalmic ointment Place 1 application into both eyes at bedtime.     Marland Kitchen tiotropium (SPIRIVA) 18 MCG inhalation capsule Place 18 mcg into inhaler and inhale daily.    Marland Kitchen triamcinolone (KENALOG) 0.025 % cream Apply 1 application topically 2 (two) times daily.    . valsartan (DIOVAN) 160 MG tablet Take 160 mg by mouth daily with breakfast.     . vancomycin (VANCOCIN) 1 GM/200ML SOLN Inject 200 mLs (1,000 mg total) into the vein every 8 (eight) hours. 4000 mL 1   No current facility-administered medications for this visit.    Allergies:   Biaxin; Clarithromycin; Lisinopril; Sulfa antibiotics; Chlorthalidone; Amoxicillin-pot clavulanate; Aspirin; Cholestyramine; Dilaudid; Lasix; and Latex    Social History:  The patient  reports that she has been smoking.  She has never used smokeless tobacco. She reports that she does not drink alcohol or use illicit drugs.   Family History:  The patient's family history includes Breast cancer in her sister; Lung cancer in her father.    ROS:  Please see the history of present illness.    Review of Systems: Constitutional:  denies fever, chills, diaphoresis, appetite change and fatigue.  HEENT: denies photophobia, eye pain, redness, hearing loss, ear pain, congestion, sore throat, rhinorrhea, sneezing, neck pain, neck stiffness and tinnitus.  Respiratory: denies SOB, DOE, cough, chest tightness, and wheezing.  Cardiovascular: admits to chest pain, palpitations , denies  leg swelling.  Gastrointestinal: denies nausea,  vomiting, abdominal pain, diarrhea, constipation, blood in stool.  Genitourinary: denies dysuria, urgency, frequency, hematuria, flank pain and difficulty urinating.  Musculoskeletal: denies  myalgias, back pain, joint swelling, arthralgias and gait problem.   Skin: denies pallor, rash and wound.  Neurological: denies dizziness, seizures, syncope, weakness, light-headedness, numbness and headaches.   Hematological: denies adenopathy, easy bruising, personal or family bleeding history.  Psychiatric/ Behavioral: denies suicidal ideation, mood changes, confusion, nervousness, sleep disturbance and agitation.       All other systems are reviewed and negative.    PHYSICAL EXAM: VS:  BP 120/90 mmHg  Pulse 98  Ht 5\' 7"  (1.702 m)  Wt 171 lb (77.565 kg)  BMI 26.78 kg/m2  LMP 07/24/2011 , BMI Body mass index is 26.78 kg/(m^2). GEN: Well nourished, well developed, in no acute distress HEENT: normal Neck: no JVD, carotid bruits, or masses Cardiac: RRR; no murmurs, rubs, or gallops,no edema  Respiratory:  Coarse wheezing - especially in the right base .    GI: soft, nontender, nondistended, + BS MS: no deformity or atrophy Skin: warm and dry, no rash Neuro:  Strength and sensation are intact Psych: normal   EKG:  EKG is ordered today. The ekg ordered today demonstrates NSR at 90.  LBBB  - the LBBB is new since her ECG in 2013.    Recent Labs: 06/03/2015: ALT 34* 07/15/2015: BUN 8; Creatinine, Ser 0.58; Hemoglobin 12.9; Platelets 196; Potassium 3.9; Sodium 140    Lipid Panel    Component Value Date/Time   CHOL 140 06/03/2015 0918   TRIG 147 06/03/2015 0918   HDL 28* 06/03/2015 0918   CHOLHDL 5.0 06/03/2015 0918   VLDL 29 06/03/2015 0918   LDLCALC 83 06/03/2015 0918      Wt Readings from Last 3 Encounters:  07/25/15 171 lb (77.565 kg)  07/15/15 176 lb 14.4 oz (80.241 kg)  06/24/15 170 lb (77.111 kg)      Other studies Reviewed: Additional studies/ records that were  reviewed today include: . Review of the above records demonstrates:    ASSESSMENT AND PLAN:  1.  Chronic systolic CHF [name]  Has an EF of 20-30% Is on valsartan 160 mg a day .  Still has lots of wheezing  But this seemed to clear after she did a deep cough. Will  Add Bisoprolol 2.5 mg a day. I'll see her again in 6 weeks. We'll continue to up titrate her bisoprolol as tolerated.   We'll also consider adding spironolactone and possibly Lasix  At her next office visits.  We'll need to consider an ICD if her ejection fraction doesn't improve. She currently has osteomyelitis of her right foot and would not be a candidate for ICD placement as long she has osteomyelitis.  2. Palpitations:  She has occasional palpitations. She's worn a monitor the past but it did not reveal any significant arrhythmias. We will continue to follow her for now. I've encouraged her to stop smoking which might help with these palpitations.  Current medicines are reviewed at length with the patient today.  The patient does not have concerns regarding medicines.  The following changes have been made:  no change  Labs/ tests ordered today include:  No orders of the defined types were placed in this encounter.     Disposition:   FU with  Me in 6 weeks    Kerra Guilfoil, Deloris Ping, MD  07/25/2015 3:44 PM    [facility] [address] [place], [place], [place]  [address] Phone: [phone]; Fax: [phone]

## 2015-07-25 NOTE — Telephone Encounter (Signed)
Spoke to Lyn at [facility] regarding low vancomycin trough of 5.3 and he stated that her dose has been adjusted. Lab result placed in Dr. Feliz Beam inbox. Wendall Mola

## 2015-07-28 ENCOUNTER — Telehealth: Payer: Self-pay

## 2015-07-28 ENCOUNTER — Telehealth: Payer: Self-pay | Admitting: Cardiovascular Disease

## 2015-07-28 NOTE — Telephone Encounter (Signed)
New Message  RN from [facility] calling to speak w/ RN concerning pt's RX of bisiprolol. . Please call back and discuss.

## 2015-07-28 NOTE — Telephone Encounter (Signed)
Prior auth for Bisoprolol 5 mg tabs sent to [facility]. Needs to go to Pharmacy review board before they will approve it.

## 2015-07-28 NOTE — Telephone Encounter (Signed)
Spoke with Amy, home health nurse from [facility] who states patient needs PA for Bisoprolol from Dr. Elease Hashimoto.  I advised her that our patient care coordinator, Odette Horns, has already submitted it and it is awaiting review.  I advised her that patient did not schedule her 6 week follow-up appointment with Dr. Elease Hashimoto when she was in the office.  She states she will be talking to the patient today and will remind her to make that appointment.  She thanked me for the call.

## 2015-07-30 ENCOUNTER — Other Ambulatory Visit: Payer: Self-pay

## 2015-07-30 ENCOUNTER — Encounter (HOSPITAL_COMMUNITY): Payer: Self-pay | Admitting: Emergency Medicine

## 2015-07-30 ENCOUNTER — Emergency Department (HOSPITAL_COMMUNITY): Payer: Medicaid Other

## 2015-07-30 ENCOUNTER — Telehealth: Payer: Self-pay

## 2015-07-30 ENCOUNTER — Emergency Department (HOSPITAL_COMMUNITY)
Admission: EM | Admit: 2015-07-30 | Discharge: 2015-07-30 | Disposition: A | Discharge status: Home / Self Care | Payer: Medicaid Other | Attending: Emergency Medicine | Admitting: Emergency Medicine

## 2015-07-30 DIAGNOSIS — J449 Chronic obstructive pulmonary disease, unspecified: Secondary | ICD-10-CM | POA: Insufficient documentation

## 2015-07-30 DIAGNOSIS — Z880 Allergy status to penicillin: Secondary | ICD-10-CM | POA: Diagnosis not present

## 2015-07-30 DIAGNOSIS — I10 Essential (primary) hypertension: Secondary | ICD-10-CM | POA: Insufficient documentation

## 2015-07-30 DIAGNOSIS — F419 Anxiety disorder, unspecified: Secondary | ICD-10-CM | POA: Diagnosis not present

## 2015-07-30 DIAGNOSIS — E119 Type 2 diabetes mellitus without complications: Secondary | ICD-10-CM | POA: Insufficient documentation

## 2015-07-30 DIAGNOSIS — Z7984 Long term (current) use of oral hypoglycemic drugs: Secondary | ICD-10-CM | POA: Insufficient documentation

## 2015-07-30 DIAGNOSIS — K219 Gastro-esophageal reflux disease without esophagitis: Secondary | ICD-10-CM | POA: Insufficient documentation

## 2015-07-30 DIAGNOSIS — F17200 Nicotine dependence, unspecified, uncomplicated: Secondary | ICD-10-CM | POA: Diagnosis not present

## 2015-07-30 DIAGNOSIS — R519 Headache, unspecified: Secondary | ICD-10-CM

## 2015-07-30 DIAGNOSIS — Z79899 Other long term (current) drug therapy: Secondary | ICD-10-CM | POA: Insufficient documentation

## 2015-07-30 DIAGNOSIS — Z91040 Latex allergy status: Secondary | ICD-10-CM | POA: Insufficient documentation

## 2015-07-30 DIAGNOSIS — F329 Major depressive disorder, single episode, unspecified: Secondary | ICD-10-CM | POA: Diagnosis not present

## 2015-07-30 DIAGNOSIS — Z7982 Long term (current) use of aspirin: Secondary | ICD-10-CM | POA: Diagnosis not present

## 2015-07-30 DIAGNOSIS — R509 Fever, unspecified: Secondary | ICD-10-CM | POA: Diagnosis not present

## 2015-07-30 DIAGNOSIS — R51 Headache: Secondary | ICD-10-CM | POA: Diagnosis not present

## 2015-07-30 HISTORY — DX: Osteomyelitis, unspecified: M86.9

## 2015-07-30 LAB — CBC WITH DIFFERENTIAL/PLATELET
BASOS ABS: 0 10*3/uL (ref 0.0–0.1)
BASOS PCT: 0 %
EOS ABS: 0.1 10*3/uL (ref 0.0–0.7)
EOS PCT: 2 %
HCT: 37.4 % (ref 36.0–46.0)
Hemoglobin: 12.1 g/dL (ref 12.0–15.0)
Lymphocytes Relative: 18 %
Lymphs Abs: 0.9 10*3/uL (ref 0.7–4.0)
MCH: 24.6 pg — ABNORMAL LOW (ref 26.0–34.0)
MCHC: 32.4 g/dL (ref 30.0–36.0)
MCV: 76.2 fL — ABNORMAL LOW (ref 78.0–100.0)
MONO ABS: 0.2 10*3/uL (ref 0.1–1.0)
Monocytes Relative: 5 %
NEUTROS ABS: 3.6 10*3/uL (ref 1.7–7.7)
Neutrophils Relative %: 75 %
PLATELETS: 151 10*3/uL (ref 150–400)
RBC: 4.91 MIL/uL (ref 3.87–5.11)
RDW: 16.6 % — AB (ref 11.5–15.5)
WBC: 4.8 10*3/uL (ref 4.0–10.5)

## 2015-07-30 LAB — I-STAT CG4 LACTIC ACID, ED: LACTIC ACID, VENOUS: 1.21 mmol/L (ref 0.5–2.0)

## 2015-07-30 LAB — COMPREHENSIVE METABOLIC PANEL
ALBUMIN: 3.5 g/dL (ref 3.5–5.0)
ALT: 50 U/L (ref 14–54)
ANION GAP: 10 (ref 5–15)
AST: 62 U/L — ABNORMAL HIGH (ref 15–41)
Alkaline Phosphatase: 139 U/L — ABNORMAL HIGH (ref 38–126)
BILIRUBIN TOTAL: 0.4 mg/dL (ref 0.3–1.2)
CALCIUM: 8.9 mg/dL (ref 8.9–10.3)
CO2: 24 mmol/L (ref 22–32)
CREATININE: 0.7 mg/dL (ref 0.44–1.00)
Chloride: 102 mmol/L (ref 101–111)
GFR calc Af Amer: >60 mL/min (ref >60)
GFR calc non Af Amer: >60 mL/min (ref >60)
GLUCOSE: 194 mg/dL — AB (ref 65–99)
Potassium: 3.9 mmol/L (ref 3.5–5.1)
Sodium: 136 mmol/L (ref 135–145)
TOTAL PROTEIN: 6.7 g/dL (ref 6.5–8.1)

## 2015-07-30 LAB — URINALYSIS, ROUTINE W REFLEX MICROSCOPIC
BILIRUBIN URINE: NEGATIVE
Glucose, UA: NEGATIVE mg/dL
Hgb urine dipstick: NEGATIVE
KETONES UR: NEGATIVE mg/dL
LEUKOCYTES UA: NEGATIVE
NITRITE: NEGATIVE
PROTEIN: NEGATIVE mg/dL
Specific Gravity, Urine: 1.018 (ref 1.005–1.030)
pH: 5.5 (ref 5.0–8.0)

## 2015-07-30 LAB — INFLUENZA PANEL BY PCR (TYPE A & B)
H1N1FLUPCR: NOT DETECTED
INFLBPCR: NEGATIVE
Influenza A By PCR: NEGATIVE

## 2015-07-30 MED ORDER — MORPHINE SULFATE (PF) 4 MG/ML IV SOLN
4.0000 mg | Freq: Once | INTRAVENOUS | Status: AC
  Administered 2015-07-30: 4 mg via INTRAVENOUS
  Filled 2015-07-30: qty 1

## 2015-07-30 MED ORDER — SODIUM CHLORIDE 0.9 % IV BOLUS (SEPSIS)
1000.0000 mL | Freq: Once | INTRAVENOUS | Status: AC
  Administered 2015-07-30: 1000 mL via INTRAVENOUS

## 2015-07-30 MED ORDER — ACETAMINOPHEN 325 MG PO TABS
650.0000 mg | ORAL_TABLET | Freq: Once | ORAL | Status: AC
Start: 2015-07-30 — End: 2015-07-30
  Administered 2015-07-30: 650 mg via ORAL
  Filled 2015-07-30: qty 2

## 2015-07-30 MED ORDER — ONDANSETRON HCL 4 MG/2ML IJ SOLN
4.0000 mg | Freq: Once | INTRAMUSCULAR | Status: AC
  Administered 2015-07-30: 4 mg via INTRAVENOUS
  Filled 2015-07-30: qty 2

## 2015-07-30 MED ORDER — VANCOMYCIN HCL IN DEXTROSE 1-5 GM/200ML-% IV SOLN
1000.0000 mg | Freq: Once | INTRAVENOUS | Status: DC
  Filled 2015-07-30: qty 200

## 2015-07-30 MED ORDER — VANCOMYCIN HCL 10 G IV SOLR
1500.0000 mg | Freq: Once | INTRAVENOUS | Status: AC
  Administered 2015-07-30: 1500 mg via INTRAVENOUS
  Filled 2015-07-30: qty 1500

## 2015-07-30 NOTE — ED Notes (Signed)
PA at bedside speaking with pt

## 2015-07-30 NOTE — Discharge Instructions (Signed)
[name],  Nice meeting you! Please follow-up with infectious disease. Return to the emergency department if your fevers do not improve, you do not feel better in a few days, or if your headache worsens or you have neck stiffness. Feel better soon!  S. Lane Hacker, [name]

## 2015-07-30 NOTE — ED Notes (Signed)
Patient's husband came out to the nurses station asking how much longer it would be before patient would be discharged so she could go home and get her dose of abx for her osteomyelitis.This RN contacted lab for status on flu swab results, Deanna Artis in lab advised it will be at least another hour before swab results are back. PA Victory Dakin made aware of status of flu swab and concerns of patient about when she is going to be discharged.

## 2015-07-30 NOTE — ED Provider Notes (Signed)
CSN: [id number]     Arrival date & time 07/30/15  7829 History   First MD Initiated Contact with Patient 07/30/15 [phone]     Chief Complaint  Patient presents with  . Fever  . Headache   HPI  [name] is a 55 y.o. F PMH significant for osteomyelitis, HTN, anxiety, GERD, COPD, DM presenting with a 2 history of fevers (102F), cough, and headache. She has had chronic drainage since 2015, and was on doxycycline s/p ankle fracture, and was admitted 07/15/15 for washout and hardware removal. No WBCs or organisms noted on gram stain; few coag negative staph on tissue culture. She denies neck stiffness, cough, CP, abdominal pain, N/V, change in bowel/bladder habits, drainage or redness at surgical or picc line site.  Past Medical History  Diagnosis Date  . Hypertension   . Anxiety   . Gallstones   . GERD (gastroesophageal reflux disease)   . COPD (chronic obstructive pulmonary disease) (HCC)   . Headache(784.0)     migraines  . Rash     arms  . Arthritis   . Depression   . Diabetes mellitus without complication (HCC)     Type II  . Dysrhythmia     pt unsure of name of arrythmia - " my heart rate will drop all of a sudden" -no current treatment - atrial flutter  . Osteomyelitis [facility])    Past Surgical History  Procedure Laterality Date  . Ectopic pregnancy surgery  many yrs ago  . Carpal tunnel release      bil  . Cholecystectomy  11/24/2011    Procedure: LAPAROSCOPIC CHOLECYSTECTOMY WITH INTRAOPERATIVE CHOLANGIOGRAM;  Surgeon: Clovis Pu. Cornett, MD;  Location: [facility] ORS;  Service: General;  Laterality: N/A;  Laparoscopic Cholecystectomy with Cholangiogram  . Cardiac catheterization N/A 06/24/2015    Procedure: Left Heart Cath and Coronary Angiography;  Surgeon: [name], MD;  Location: [facility] INVASIVE CV LAB;  Service: Cardiovascular;  Laterality: N/A;  . Ankle fracture surgery Right 2015  . Hardware removal Right 07/15/2015    Procedure: RIGHT ANKLE HARDWARE REMOVAL, PLACEMENT OF  STIMULAN ANTIBIOTIC BEADS AND PREVENA WOUND VAC.;  Surgeon: [name], MD;  Location: [facility] OR;  Service: Orthopedics;  Laterality: Right;   Family History  Problem Relation Age of Onset  . Breast cancer Sister   . Lung cancer Father    Social History  Substance Use Topics  . Smoking status: Current Every Day Smoker -- 0.50 packs/day for 20 years  . Smokeless tobacco: Never Used  . Alcohol Use: No   OB History    No data available     Review of Systems  Ten systems are reviewed and are negative for acute change except as noted in the HPI  Allergies  Biaxin; Clarithromycin; Lisinopril; Sulfa antibiotics; Chlorthalidone; Amoxicillin-pot clavulanate; Aspirin; Cholestyramine; Dilaudid; Lasix; and Latex  Home Medications   Prior to Admission medications   Medication Sig Start Date End Date Taking? Authorizing Provider  acetaminophen (TYLENOL) 325 MG tablet Take 650 mg by mouth every 6 (six) hours as needed for mild pain.   Yes Historical Provider, MD  albuterol (PROVENTIL HFA;VENTOLIN HFA) 108 (90 BASE) MCG/ACT inhaler Inhale 2 puffs into the lungs every 4 (four) hours as needed for wheezing. Wheezing   Yes Historical Provider, MD  ALPRAZolam (XANAX) 0.5 MG tablet Take 1 mg by mouth 2 (two) times daily. Anxiety   Yes Historical Provider, MD  aspirin EC 81 MG tablet Take 1 tablet (81 mg  total) by mouth daily. 07/25/15  Yes [name], MD  bisoprolol (ZEBETA) 5 MG tablet Take 0.5 tablets (2.5 mg total) by mouth daily. 07/25/15  Yes [name], MD  diphenhydrAMINE (BENADRYL) 25 MG tablet Take 25 mg by mouth every 6 (six) hours as needed for itching or allergies.   Yes Historical Provider, MD  FLUoxetine (PROZAC) 40 MG capsule Take 40 mg by mouth daily with breakfast.    Yes Historical Provider, MD  glimepiride (AMARYL) 2 MG tablet Take 2 mg by mouth daily with breakfast.   Yes Historical Provider, MD  hydrOXYzine (ATARAX/VISTARIL) 10 MG tablet Take 20 mg by mouth every 6  (six) hours as needed for itching.   Yes Historical Provider, MD  imipramine (TOFRANIL) 25 MG tablet Take 50 mg by mouth at bedtime. Take 2 hours before bedtime   Yes Historical Provider, MD  levofloxacin (LEVAQUIN) 750 MG tablet Take 1 tablet (750 mg total) by mouth daily. 07/17/15  Yes Scott Rise Paganini, MD  loperamide (IMODIUM) 2 MG capsule Take 2 mg by mouth 2 (two) times daily.    Yes Historical Provider, MD  metFORMIN (GLUCOPHAGE) 1000 MG tablet Take 1,000 mg by mouth daily with breakfast.   Yes Historical Provider, MD  omeprazole (PRILOSEC) 20 MG capsule Take 20 mg by mouth 2 (two) times daily.    Yes Historical Provider, MD  oxyCODONE-acetaminophen (PERCOCET) 10-325 MG tablet Take 1 tablet by mouth every 6 (six) hours as needed. AS NEEDED FOR PAIN FROM SURGERY 07/01/15  Yes Historical Provider, MD  pravastatin (PRAVACHOL) 40 MG tablet Take 40 mg by mouth daily.   Yes Historical Provider, MD  sitaGLIPtin (JANUVIA) 100 MG tablet Take 100 mg by mouth daily.   Yes Historical Provider, MD  sodium chloride (MURO 128) 5 % ophthalmic ointment Place 1 application into both eyes at bedtime.    Yes Historical Provider, MD  tiotropium (SPIRIVA) 18 MCG inhalation capsule Place 18 mcg into inhaler and inhale daily.   Yes Historical Provider, MD  triamcinolone (KENALOG) 0.025 % cream Apply 1 application topically 2 (two) times daily.   Yes Historical Provider, MD  valsartan (DIOVAN) 160 MG tablet Take 160 mg by mouth daily with breakfast.    Yes Historical Provider, MD  vancomycin (VANCOCIN) 1 GM/200ML SOLN Inject 200 mLs (1,000 mg total) into the vein every 8 (eight) hours. 07/17/15  Yes Scott Rise Paganini, MD   BP 112/71 mmHg  Pulse 95  Temp(Src) 98.4 F (36.9 C) (Oral)  Resp 22  SpO2 96%  LMP 07/24/2011 Physical Exam  Constitutional: She is oriented to person, place, and time. She appears well-developed and well-nourished. No distress.  HENT:  Head: Normocephalic and atraumatic.  Right Ear:  External ear normal.  Left Ear: External ear normal.  Nose: Nose normal.  Mouth/Throat: Oropharynx is clear and moist. No oropharyngeal exudate.  Eyes: Conjunctivae are normal. Right eye exhibits no discharge. Left eye exhibits no discharge. No scleral icterus.  Neck: Normal range of motion. Neck supple. No tracheal deviation present.  Cardiovascular: Normal rate, regular rhythm, normal heart sounds and intact distal pulses.  Exam reveals no gallop and no friction rub.   No murmur heard. Pulmonary/Chest: Effort normal and breath sounds normal. No respiratory distress. She has no wheezes. She has no rales. She exhibits no tenderness.  Abdominal: Soft. Bowel sounds are normal. She exhibits no distension and no mass. There is no tenderness. There is no rebound and no guarding.  Musculoskeletal: Normal range of  motion. She exhibits no edema or tenderness.  LLE surgical site would clean and dry without erythema or drainage. Neurovascularly intact BL.   Lymphadenopathy:    She has no cervical adenopathy.  Neurological: She is alert and oriented to person, place, and time. No cranial nerve deficit. Coordination normal.  Skin: Skin is warm and dry. No rash noted. She is not diaphoretic. No erythema.  Picc site clean and dry without erythema, drainage, edema.   Psychiatric: She has a normal mood and affect. Her behavior is normal.  Nursing note and vitals reviewed.   ED Course  Procedures  Labs Review Labs Reviewed  CBC WITH DIFFERENTIAL/PLATELET - Abnormal; Notable for the following:    MCV 76.2 (*)    MCH 24.6 (*)    RDW 16.6 (*)    All other components within normal limits  CULTURE, BLOOD (ROUTINE X 2)  CULTURE, BLOOD (ROUTINE X 2)  URINE CULTURE  COMPREHENSIVE METABOLIC PANEL  URINALYSIS, ROUTINE W REFLEX MICROSCOPIC (NOT AT [facility])  INFLUENZA PANEL BY PCR (TYPE A & B, H1N1)  I-STAT CG4 LACTIC ACID, ED   Imaging Review Dg Chest 2 View  07/30/2015  CLINICAL DATA:  Fever and cough since  Tuesday EXAM: CHEST  2 VIEW COMPARISON:  04/09/2015 FINDINGS: Right upper extremity PICC with tip at the lower SVC. Normal heart size and mediastinal contours. No acute infiltrate or edema. No effusion or pneumothorax. Cholecystectomy changes. IMPRESSION: No active cardiopulmonary disease. Electronically Signed   By: Marnee Spring M.D.   On: 07/30/2015 06:50   Dg Ankle Complete Right  07/30/2015  CLINICAL DATA:  Fever. History of osteomyelitis with hardware removal 07/15/2015. Cough EXAM: RIGHT ANKLE - COMPLETE 3+ VIEW COMPARISON:  08/19/2014 FINDINGS: Nonspecific soft tissue swelling about the lateral ankle which could be related to reported infection and/or recent surgery. Distal fibular fixation hardware has been removed, which accounts for minimal periosteal change or bone fragments at the screw tracks proximally. No acute fracture or malalignment. No focal lysis to suggest osteomyelitis. IMPRESSION: Recent infection and fibular hardware removal with soft tissue swelling. No acute osseous finding. Electronically Signed   By: Marnee Spring M.D.   On: 07/30/2015 06:41   I have personally reviewed and evaluated these images and lab results as part of my medical decision-making.   EKG Interpretation   Date/Time:  Wednesday July 30 2015 05:43:19 EST Ventricular Rate:  97 PR Interval:  153 QRS Duration: 142 QT Interval:  400 QTC Calculation: 508 R Axis:   -27 Text Interpretation:  Sinus rhythm Probable left atrial enlargement Left  bundle branch block Baseline wander in lead(s) II III aVF V3 V4 V5 No  significant change since last tracing Confirmed by HORTON  MD, COURTNEY  ([id number]) on 07/30/2015 7:47:32 AM      MDM   Final diagnoses:  Fever, unspecified fever cause  Headache, unspecified headache type   Patient non-toxic appearing and VSS. Temperature here is 99.4. No meningeal signs. Broad workup for fever-sepsis workup initiated.   Medications  sodium chloride 0.9 % bolus 1,000  mL (0 mLs Intravenous Stopped 07/30/15 0710)  morphine 4 MG/ML injection 4 mg (4 mg Intravenous Given 07/30/15 0610)  ondansetron (ZOFRAN) injection 4 mg (4 mg Intravenous Given 07/30/15 0610)  sodium chloride 0.9 % bolus 1,000 mL (0 mLs Intravenous Stopped 07/30/15 1100)  acetaminophen (TYLENOL) tablet 650 mg (650 mg Oral Given 07/30/15 1035)  vancomycin (VANCOCIN) 1,500 mg in sodium chloride 0.9 % 500 mL IVPB (0 mg Intravenous Stopped  07/30/15 1325)   CMP with hyperglycemia of 194. UA, lactic, CBC, CXR, ankle xray, EKG unremarkable for acute change. Flu negative.  Consulted infectious disease who advised outpatient follow-up.  Patient feels improved after observation and/or treatment in ED.  Patient may be safely discharged home.  Discussed reasons for return. Patient to follow-up with infectious disease. Patient in understanding and agreement with the plan.    Melton Krebs, [name] 07/31/15 2117  Shon Baton, MD 08/03/15 [phone]

## 2015-07-30 NOTE — ED Notes (Signed)
Pt to xray

## 2015-07-30 NOTE — ED Notes (Signed)
Pt in from home reporting fever, HA, body aches. Pt was recently DC from hospital. Had surgery on RLE bc osteomyelitis. Wants to make sure no post op infection, flu etc.

## 2015-07-30 NOTE — ED Notes (Signed)
Patient's husband out to the nurses station stating he was concerned that patient had not had her type II diabetes meds this morning since she was here when she would normally take them. PA Victory Dakin made aware.

## 2015-07-30 NOTE — ED Notes (Signed)
PA Riley at bedside.  

## 2015-07-30 NOTE — ED Notes (Signed)
PA Victory Dakin made aware of patient's headache and request for something for pain

## 2015-07-30 NOTE — ED Provider Notes (Addendum)
MSE was initiated and I personally evaluated the patient and placed orders (if any) at  5:35 AM on July 30, 2015.  The patient appears stable so that the remainder of the MSE may be completed by another provider.  Patient presents with fever. Recent history of osteomyelitis of the right ankle. Was discharged home with IV vancomycin as well as by mouth Levaquin. Reports 2 day history of fevers. Most recent fever 102 at home. Does report a cough and headache. No neck stiffness. Temperature here 99.4. On physical exam she is awake, alert, and oriented. Mildly tachycardic. Breath sounds clear. Normal range of motion of the neck. No meningismus. PICC site clean dry and intact, minimal erythema. Right lower extremity surgical wound clean dry and intact, no erythema. Sepsis workup initiated. Flu screening added. Doubt meningitis.   Will sign out to oncoming provider.  Shon Baton, MD 07/30/15 [phone]  ED ECG REPORT   Date: 07/30/2015  Rate: 97  Rhythm: normal sinus rhythm  QRS Axis: left  Intervals: normal  ST/T Wave abnormalities: nonspecific ST changes and nonspecific T wave changes  Conduction Disutrbances:left bundle branch block  Narrative Interpretation:   Old EKG Reviewed: unchanged  I have personally reviewed the EKG tracing and agree with the computerized printout as noted.    Shon Baton, MD 07/30/15 0600

## 2015-07-30 NOTE — ED Notes (Signed)
Upon attempting to administer vancomycin, patient reports she is on 1500mg  at home, not 1000mg .

## 2015-07-30 NOTE — Telephone Encounter (Signed)
Bisoprolol 5mg  (1/2 tab) daily approved by [employer]. Case ID# [id number]. Pharmacy notified.

## 2015-07-30 NOTE — ED Notes (Signed)
Patient and family made aware that PA Victory Dakin advised she would be in to talk to them soon.

## 2015-07-31 ENCOUNTER — Telehealth: Payer: Self-pay | Admitting: Internal Medicine

## 2015-07-31 ENCOUNTER — Telehealth: Payer: Self-pay | Admitting: *Deleted

## 2015-07-31 LAB — URINE CULTURE: Culture: NO GROWTH

## 2015-07-31 NOTE — Telephone Encounter (Signed)
New IV orders per Dr Drue Second.  Discontinue IV Vancomycin.  Begin Daptomycin IV, 6 MG / Kg, daily, same stop date as Vancomycin.  Obtain Baseline CK and then weekly CK.   Phone call to [facility], Corrie Dandy.  Verbalized back orders.

## 2015-07-31 NOTE — Telephone Encounter (Signed)
Patient is having intermittent fever. Had blood cx which were ngtd at 72hr. Also went to ED , where they did not find any suspicious sources.  She is currently on vanco and levo for ankle HW infection. She has no sick contacts. Lab work did show drop with baseilne wbc. ddx viral, bacterial infection not treated by vanco or levo. Unlikely picc line since it looks ok and bloodcx are negative. Possibly may have drug fever with ? Early BM suppression. Will change her from vanco to daptomycin to see if any difference in symptoms. Has appt next week at [facility]

## 2015-08-04 ENCOUNTER — Telehealth: Payer: Self-pay | Admitting: Internal Medicine

## 2015-08-04 LAB — CULTURE, BLOOD (ROUTINE X 2)
CULTURE: NO GROWTH
Culture: NO GROWTH

## 2015-08-04 NOTE — Telephone Encounter (Signed)
Just spoke with Amy, [facility] rn who gave me update on ms. Deans on how she is tolerating daptomycin. First dose given on Friday, under observation for 1 hour post infusion did fine. The patient reports that over the weekend when taking the medication she reports feeling dizzy and difficulty breathing roughly starting 2 hours after the infusion lasting 5+ hrs. Denies any lip swelling or rash. Patient has labs drawn today to see if having any eosinophilia or other signs of DRESS. No longer having fevers.  I have asked the RN to tell patient to stop further infusion. She is now only on levofloxacin. She has upcoming appt with dr. Ninetta Lights on 2/9 to address if want to stop IV treatment and just resort to levofloxacin and possibly doxycycline. Returning back to vancomycin.  Patient has long list of abtx allergy.

## 2015-08-07 ENCOUNTER — Encounter: Payer: Self-pay | Admitting: Infectious Diseases

## 2015-08-07 ENCOUNTER — Ambulatory Visit (INDEPENDENT_AMBULATORY_CARE_PROVIDER_SITE_OTHER): Payer: Medicaid Other | Admitting: Infectious Diseases

## 2015-08-07 ENCOUNTER — Other Ambulatory Visit: Payer: Self-pay | Admitting: Orthopedic Surgery

## 2015-08-07 VITALS — BP 126/81 | HR 83 | Temp 98.1°F | Ht 67.0 in | Wt 165.0 lb

## 2015-08-07 DIAGNOSIS — Z23 Encounter for immunization: Secondary | ICD-10-CM

## 2015-08-07 DIAGNOSIS — M869 Osteomyelitis, unspecified: Secondary | ICD-10-CM | POA: Diagnosis not present

## 2015-08-07 DIAGNOSIS — M545 Low back pain: Secondary | ICD-10-CM

## 2015-08-07 NOTE — Progress Notes (Signed)
   Subjective:    Patient ID: [name], female    DOB: 05-22-1961, 55 y.o.   MRN: [id number]  HPI 55 y.o. F with DM2 (since 2013), COPD, HTN who fractured her ankle in 2015 and since March 2016 noted an opening with yellow/white drainage and was put on doxycycline. Had increased pain and and saw [name] and put her on doxy. She was adm on 1-17 and underwent washout and removal of hardware. She was d/c home on 1-19 on vanco and levaquin. Her operative Cx grew CNS.  Was seen in ED 2-1 for reported fever at home (afeb in ED).  Today states that she is still having leg pain. Wound is still open, had stitches removed in last week.  No problems with PIC. Has been off vanco for 2 weeks. Has since been daptomytcin until 08-03-15. Was giving her n/v.  Has continued to take levaquin.  Has "almost quit smoking"; 2-3 cigarettes/day.   PMhx, Soc, Fhx reviewed, updated in EPIC.   Review of Systems  Constitutional: Positive for appetite change. Negative for fever and chills.  Please see HPI. 12 point ROS o/w (-)  FSG have been in the 130s    Objective:   Physical Exam  Constitutional: She appears well-developed and well-nourished.  HENT:  Mouth/Throat: No oropharyngeal exudate.  Eyes: EOM are normal. Pupils are equal, round, and reactive to light.  Neck: Neck supple.  Cardiovascular: Normal rate, regular rhythm and normal heart sounds.   Pulmonary/Chest: Effort normal and breath sounds normal.  Abdominal: Soft. Bowel sounds are normal. There is no tenderness. There is no rebound.  Musculoskeletal:       Arms:      Legs: Lymphadenopathy:    She has no cervical adenopathy.          Assessment & Plan:

## 2015-08-07 NOTE — Assessment & Plan Note (Signed)
Will change her to Ceftaroline '600mg'$  bid after discussing with pharm.  Will see her back in 1 month Weekly ESR, CMP, CBC, CRP She appears to be healing very well.  Cc her orthopedist.

## 2015-08-25 ENCOUNTER — Telehealth: Payer: Self-pay | Admitting: *Deleted

## 2015-08-25 ENCOUNTER — Inpatient Hospital Stay: Admission: RE | Admit: 2015-08-25 | Payer: Medicaid Other | Source: Ambulatory Visit

## 2015-08-25 NOTE — Telephone Encounter (Signed)
Patient scheduled to complete IV antibiotics 2/28, does not have follow up until 3/13.  Please advise if there are any orders to maintain PICC until seen in office or if her PICC can be pulled at completion of antibiotics. Andree Coss, RN

## 2015-08-26 NOTE — Telephone Encounter (Signed)
She needs to continue ceftaroline til she has f/u appt as she was off anbx for 2 weeks

## 2015-08-26 NOTE — Telephone Encounter (Signed)
Verbal order given to [facility], nursing notified. Thanks! Andree Coss, RN

## 2015-08-28 LAB — AFB CULTURE WITH SMEAR (NOT AT ARMC): ACID FAST SMEAR: NONE SEEN

## 2015-09-01 ENCOUNTER — Ambulatory Visit: Payer: Medicaid Other | Admitting: Cardiovascular Disease

## 2015-09-04 ENCOUNTER — Other Ambulatory Visit: Payer: Medicaid Other

## 2015-09-08 ENCOUNTER — Ambulatory Visit (INDEPENDENT_AMBULATORY_CARE_PROVIDER_SITE_OTHER): Payer: Self-pay | Admitting: Infectious Diseases

## 2015-09-08 ENCOUNTER — Encounter: Payer: Self-pay | Admitting: Infectious Diseases

## 2015-09-08 VITALS — BP 106/69 | HR 59 | Temp 98.1°F | Wt 169.0 lb

## 2015-09-08 DIAGNOSIS — M869 Osteomyelitis, unspecified: Secondary | ICD-10-CM

## 2015-09-08 DIAGNOSIS — M5430 Sciatica, unspecified side: Secondary | ICD-10-CM | POA: Insufficient documentation

## 2015-09-08 DIAGNOSIS — M5441 Lumbago with sciatica, right side: Secondary | ICD-10-CM

## 2015-09-08 DIAGNOSIS — M549 Dorsalgia, unspecified: Secondary | ICD-10-CM

## 2015-09-08 DIAGNOSIS — E119 Type 2 diabetes mellitus without complications: Secondary | ICD-10-CM

## 2015-09-08 NOTE — Assessment & Plan Note (Signed)
She will f/u with ortho, possible MRI.

## 2015-09-08 NOTE — Progress Notes (Addendum)
RN received verbal order to discontinue the patient's PICC line.  Patient identified with name and date of birth. PICC dressing removed, site unremarkable.  PICC line removed using sterile procedure @ 1200. PICC length equal to that noted in patient's hospital chart of 41 cm. Sterile petroleum gauze + sterile 4X4 applied to PICC site, pressure applied for 10 minutes and covered with Medipore tape as a pressure dressing. Patient tolerated procedure without complaints.  Patient instructed to limit use of arm for 1 hour. Patient instructed that the pressure dressing should remain in place for 24 hours. Patient verbalized understanding of these instructions.  [facility] informed that PICC was discontinued and IV antibiotic discontinued.

## 2015-09-08 NOTE — Assessment & Plan Note (Signed)
Will stop her anbx today Pull her pic.  Her ESr and CRP were normal on 09-01-15.  She has f/u with ortho in 2 months.  Will see her back prn.

## 2015-09-08 NOTE — Assessment & Plan Note (Signed)
FSG have been in 140s, been doing well.

## 2015-09-08 NOTE — Progress Notes (Signed)
   Subjective:    Patient ID: [name], female    DOB: 06/13/1961, 55 y.o.   MRN: [id number]  HPI 55 y.o. F with DM2 (since 2013), COPD, HTN who fractured her ankle in 2015 and since March 2016 noted an opening with yellow/white drainage and was put on doxycycline. Had increased pain and and saw [name] and put her on doxy. She was adm on 1-17 and underwent washout and removal of hardware. She was d/c home on 1-19 on vanco and levaquin. Her operative Cx grew CNS.  Was seen in ED 2-1 for reported fever at home (afeb in ED).  Today states that she is still having leg pain. Wound is still open, had stitches removed in last week.  No problems with PIC. Has been off vanco for 2 weeks. Has since been daptomytcin until 08-03-15. Was giving her n/v. Zt her ID f/u on 2-9 she was changed to ceftaroline.  Off levaquin for last 2 weeks.  Has "almost quit smoking"; 2 cigarettes/day.  No fever. Thought she had GI bug this weekend, had chills.  States her weight bearing is normal.  PIC has been pruritic, but otherwise no problems.   Complains of R low back pain. Radiating down her leg.   Review of Systems  Constitutional: Negative for appetite change and unexpected weight change.  Gastrointestinal: Negative for diarrhea and constipation.  Genitourinary: Negative for difficulty urinating.  Musculoskeletal: Positive for back pain.       Objective:   Physical Exam  Constitutional: She appears well-developed and well-nourished.  Musculoskeletal:       Arms:      Feet:          Assessment & Plan:

## 2015-09-16 ENCOUNTER — Encounter: Payer: Self-pay | Admitting: Infectious Diseases

## 2015-10-18 ENCOUNTER — Encounter (HOSPITAL_COMMUNITY): Payer: Self-pay | Admitting: Emergency Medicine

## 2015-10-18 DIAGNOSIS — I10 Essential (primary) hypertension: Secondary | ICD-10-CM | POA: Insufficient documentation

## 2015-10-18 DIAGNOSIS — F17210 Nicotine dependence, cigarettes, uncomplicated: Secondary | ICD-10-CM | POA: Insufficient documentation

## 2015-10-18 DIAGNOSIS — E1165 Type 2 diabetes mellitus with hyperglycemia: Secondary | ICD-10-CM | POA: Insufficient documentation

## 2015-10-18 DIAGNOSIS — J449 Chronic obstructive pulmonary disease, unspecified: Secondary | ICD-10-CM | POA: Insufficient documentation

## 2015-10-18 LAB — BASIC METABOLIC PANEL
Anion gap: 11 (ref 5–15)
BUN: 10 mg/dL (ref 6–20)
CO2: 24 mmol/L (ref 22–32)
Calcium: 9.7 mg/dL (ref 8.9–10.3)
Chloride: 99 mmol/L — ABNORMAL LOW (ref 101–111)
Creatinine, Ser: 0.9 mg/dL (ref 0.44–1.00)
GFR calc Af Amer: >60 mL/min (ref >60)
GLUCOSE: 501 mg/dL — AB (ref 65–99)
POTASSIUM: 3.8 mmol/L (ref 3.5–5.1)
Sodium: 134 mmol/L — ABNORMAL LOW (ref 135–145)

## 2015-10-18 LAB — CBC
HCT: 40.1 % (ref 36.0–46.0)
Hemoglobin: 12.8 g/dL (ref 12.0–15.0)
MCH: 23.6 pg — ABNORMAL LOW (ref 26.0–34.0)
MCHC: 31.9 g/dL (ref 30.0–36.0)
MCV: 74 fL — ABNORMAL LOW (ref 78.0–100.0)
Platelets: 226 10*3/uL (ref 150–400)
RBC: 5.42 MIL/uL — ABNORMAL HIGH (ref 3.87–5.11)
RDW: 18.4 % — AB (ref 11.5–15.5)
WBC: 16.6 10*3/uL — ABNORMAL HIGH (ref 4.0–10.5)

## 2015-10-18 LAB — URINALYSIS, ROUTINE W REFLEX MICROSCOPIC
BILIRUBIN URINE: NEGATIVE
HGB URINE DIPSTICK: NEGATIVE
KETONES UR: NEGATIVE mg/dL
Leukocytes, UA: NEGATIVE
NITRITE: NEGATIVE
PH: 5.5 (ref 5.0–8.0)
Protein, ur: NEGATIVE mg/dL
Specific Gravity, Urine: 1.045 — ABNORMAL HIGH (ref 1.005–1.030)

## 2015-10-18 LAB — CBG MONITORING, ED: Glucose-Capillary: 473 mg/dL — ABNORMAL HIGH (ref 65–99)

## 2015-10-18 LAB — URINE MICROSCOPIC-ADD ON

## 2015-10-18 NOTE — ED Notes (Signed)
Pt. reports elevated blood sugar this evening at home >600 , pt. has not taken her diabetic medications for several days , denies fever or chills /respirations unlabored.

## 2015-10-18 NOTE — ED Notes (Signed)
Dr. Clydene Pugh ( EDP ) notified on pt.'s elevated CBG , no orders received.

## 2015-10-19 ENCOUNTER — Emergency Department (HOSPITAL_COMMUNITY)
Admission: EM | Admit: 2015-10-19 | Discharge: 2015-10-19 | Disposition: A | Discharge status: Home / Self Care | Payer: Medicaid Other | Attending: Emergency Medicine | Admitting: Emergency Medicine

## 2015-10-19 LAB — BRAIN NATRIURETIC PEPTIDE: B NATRIURETIC PEPTIDE 5: 115.1 pg/mL — AB (ref 0.0–100.0)

## 2015-10-19 MED ORDER — INSULIN ASPART 100 UNIT/ML ~~LOC~~ SOLN
10.0000 [IU] | Freq: Once | SUBCUTANEOUS | Status: DC
  Filled 2015-10-19: qty 1

## 2015-10-19 MED ORDER — SODIUM CHLORIDE 0.9 % IV BOLUS (SEPSIS): 1000.0000 mL | Freq: Once | INTRAVENOUS | Status: DC

## 2015-10-19 NOTE — ED Notes (Signed)
No answer x3

## 2015-10-19 NOTE — ED Notes (Signed)
Pt called for in waiting area. NO Response

## 2015-10-20 LAB — URINE CULTURE
Culture: 1000 — AB
SPECIAL REQUESTS: NORMAL

## 2015-10-24 ENCOUNTER — Emergency Department (HOSPITAL_COMMUNITY)
Admission: EM | Admit: 2015-10-24 | Discharge: 2015-10-25 | Disposition: A | Discharge status: Home / Self Care | Payer: Medicaid Other | Attending: Emergency Medicine | Admitting: Emergency Medicine

## 2015-10-24 ENCOUNTER — Encounter (HOSPITAL_COMMUNITY): Payer: Self-pay | Admitting: Emergency Medicine

## 2015-10-24 DIAGNOSIS — I10 Essential (primary) hypertension: Secondary | ICD-10-CM | POA: Insufficient documentation

## 2015-10-24 DIAGNOSIS — M1990 Unspecified osteoarthritis, unspecified site: Secondary | ICD-10-CM | POA: Insufficient documentation

## 2015-10-24 DIAGNOSIS — Z880 Allergy status to penicillin: Secondary | ICD-10-CM | POA: Insufficient documentation

## 2015-10-24 DIAGNOSIS — K219 Gastro-esophageal reflux disease without esophagitis: Secondary | ICD-10-CM | POA: Insufficient documentation

## 2015-10-24 DIAGNOSIS — R739 Hyperglycemia, unspecified: Secondary | ICD-10-CM

## 2015-10-24 DIAGNOSIS — F329 Major depressive disorder, single episode, unspecified: Secondary | ICD-10-CM | POA: Insufficient documentation

## 2015-10-24 DIAGNOSIS — Z79899 Other long term (current) drug therapy: Secondary | ICD-10-CM | POA: Insufficient documentation

## 2015-10-24 DIAGNOSIS — Z91040 Latex allergy status: Secondary | ICD-10-CM | POA: Insufficient documentation

## 2015-10-24 DIAGNOSIS — Z7952 Long term (current) use of systemic steroids: Secondary | ICD-10-CM | POA: Insufficient documentation

## 2015-10-24 DIAGNOSIS — J441 Chronic obstructive pulmonary disease with (acute) exacerbation: Secondary | ICD-10-CM | POA: Insufficient documentation

## 2015-10-24 DIAGNOSIS — Z7984 Long term (current) use of oral hypoglycemic drugs: Secondary | ICD-10-CM | POA: Insufficient documentation

## 2015-10-24 DIAGNOSIS — F17210 Nicotine dependence, cigarettes, uncomplicated: Secondary | ICD-10-CM | POA: Insufficient documentation

## 2015-10-24 DIAGNOSIS — E1165 Type 2 diabetes mellitus with hyperglycemia: Secondary | ICD-10-CM | POA: Insufficient documentation

## 2015-10-24 DIAGNOSIS — R197 Diarrhea, unspecified: Secondary | ICD-10-CM | POA: Insufficient documentation

## 2015-10-24 DIAGNOSIS — R079 Chest pain, unspecified: Secondary | ICD-10-CM | POA: Insufficient documentation

## 2015-10-24 DIAGNOSIS — F419 Anxiety disorder, unspecified: Secondary | ICD-10-CM | POA: Insufficient documentation

## 2015-10-24 DIAGNOSIS — Z9889 Other specified postprocedural states: Secondary | ICD-10-CM | POA: Insufficient documentation

## 2015-10-24 DIAGNOSIS — G4489 Other headache syndrome: Secondary | ICD-10-CM

## 2015-10-24 DIAGNOSIS — Z7982 Long term (current) use of aspirin: Secondary | ICD-10-CM | POA: Insufficient documentation

## 2015-10-24 LAB — CBG MONITORING, ED: GLUCOSE-CAPILLARY: 418 mg/dL — AB (ref 65–99)

## 2015-10-24 NOTE — ED Notes (Signed)
Pt reports tonight her and her fiance were going to the store and she began to have a heavy intense pain under her left breast radiating to her left shoulder blade.  Reports similar feelings like this in the past.  Pt also reports she has "heart" medicine and "diabetic" medicine that she has not taken in 2 weeks due to being unable to afford it.  Fiance at bedside.

## 2015-10-25 ENCOUNTER — Inpatient Hospital Stay (HOSPITAL_COMMUNITY)
Admission: AD | Admit: 2015-10-25 | Discharge: 2015-10-26 | Disposition: A | Discharge status: Home / Self Care | Payer: Medicaid Other | Source: Ambulatory Visit | Attending: Obstetrics and Gynecology | Admitting: Obstetrics and Gynecology

## 2015-10-25 ENCOUNTER — Emergency Department (HOSPITAL_COMMUNITY): Payer: Medicaid Other

## 2015-10-25 DIAGNOSIS — E1165 Type 2 diabetes mellitus with hyperglycemia: Secondary | ICD-10-CM

## 2015-10-25 DIAGNOSIS — F17210 Nicotine dependence, cigarettes, uncomplicated: Secondary | ICD-10-CM | POA: Insufficient documentation

## 2015-10-25 DIAGNOSIS — F419 Anxiety disorder, unspecified: Secondary | ICD-10-CM | POA: Insufficient documentation

## 2015-10-25 DIAGNOSIS — I10 Essential (primary) hypertension: Secondary | ICD-10-CM | POA: Insufficient documentation

## 2015-10-25 DIAGNOSIS — R1032 Left lower quadrant pain: Secondary | ICD-10-CM

## 2015-10-25 DIAGNOSIS — B3731 Acute candidiasis of vulva and vagina: Secondary | ICD-10-CM

## 2015-10-25 DIAGNOSIS — K219 Gastro-esophageal reflux disease without esophagitis: Secondary | ICD-10-CM | POA: Insufficient documentation

## 2015-10-25 DIAGNOSIS — J449 Chronic obstructive pulmonary disease, unspecified: Secondary | ICD-10-CM | POA: Insufficient documentation

## 2015-10-25 DIAGNOSIS — B373 Candidiasis of vulva and vagina: Secondary | ICD-10-CM | POA: Insufficient documentation

## 2015-10-25 DIAGNOSIS — Z880 Allergy status to penicillin: Secondary | ICD-10-CM | POA: Insufficient documentation

## 2015-10-25 DIAGNOSIS — F329 Major depressive disorder, single episode, unspecified: Secondary | ICD-10-CM | POA: Insufficient documentation

## 2015-10-25 HISTORY — DX: Unspecified atrial fibrillation: I48.91

## 2015-10-25 LAB — BASIC METABOLIC PANEL
ANION GAP: 13 (ref 5–15)
BUN: 7 mg/dL (ref 6–20)
CHLORIDE: 98 mmol/L — AB (ref 101–111)
CO2: 21 mmol/L — AB (ref 22–32)
Calcium: 9.2 mg/dL (ref 8.9–10.3)
Creatinine, Ser: 0.78 mg/dL (ref 0.44–1.00)
GFR calc non Af Amer: >60 mL/min (ref >60)
GLUCOSE: 437 mg/dL — AB (ref 65–99)
POTASSIUM: 3.7 mmol/L (ref 3.5–5.1)
SODIUM: 132 mmol/L — AB (ref 135–145)

## 2015-10-25 LAB — CBC
HEMATOCRIT: 44.5 % (ref 36.0–46.0)
HEMOGLOBIN: 14.2 g/dL (ref 12.0–15.0)
MCH: 23.2 pg — AB (ref 26.0–34.0)
MCHC: 31.9 g/dL (ref 30.0–36.0)
MCV: 72.8 fL — ABNORMAL LOW (ref 78.0–100.0)
Platelets: 151 10*3/uL (ref 150–400)
RBC: 6.11 MIL/uL — ABNORMAL HIGH (ref 3.87–5.11)
RDW: 18.2 % — ABNORMAL HIGH (ref 11.5–15.5)
WBC: 7.7 10*3/uL (ref 4.0–10.5)

## 2015-10-25 LAB — I-STAT TROPONIN, ED: Troponin i, poc: 0 ng/mL (ref 0.00–0.08)

## 2015-10-25 MED ORDER — GLIMEPIRIDE 4 MG PO TABS: 4.0000 mg | ORAL_TABLET | Freq: Every day | ORAL | Status: DC

## 2015-10-25 MED ORDER — ACETAMINOPHEN 325 MG PO TABS
650.0000 mg | ORAL_TABLET | Freq: Once | ORAL | Status: AC
  Administered 2015-10-25: 650 mg via ORAL
  Filled 2015-10-25: qty 2

## 2015-10-25 MED ORDER — LORAZEPAM 1 MG PO TABS
1.0000 mg | ORAL_TABLET | Freq: Once | ORAL | Status: AC
  Administered 2015-10-25: 1 mg via ORAL
  Filled 2015-10-25: qty 1

## 2015-10-25 MED ORDER — GLIMEPIRIDE 4 MG PO TABS
4.0000 mg | ORAL_TABLET | Freq: Every day | ORAL | Status: DC
  Administered 2015-10-25: 4 mg via ORAL
  Filled 2015-10-25 (×2): qty 1

## 2015-10-25 NOTE — MAU Note (Addendum)
Frequent urination and burning when voids for 2 wks. Was seen at [facility] early am due to chest pain. I have diabetes and HTN and heart issues but have not taken meds in 2 wks. I ran out of meds and lost medicaid due to daughter turning 54yo. Have appt [facility] to hopefully get medicaid again. Diarrhea for a wk. My blood sugar was very high at [facility]. Pain LLQ of abdomen when i sit on toilet

## 2015-10-25 NOTE — Discharge Instructions (Signed)

## 2015-10-25 NOTE — ED Provider Notes (Signed)
CSN: [id number]     Arrival date & time 10/24/15  2343 History  By signing my name below, I, Linus Galas, attest that this documentation has been prepared under the direction and in the presence of [name], MD. Electronically Signed: Linus Galas, ED Scribe. 10/25/2015. 3:37 AM.   Chief Complaint  Patient presents with  . Chest Pain   The history is provided by the patient. No language interpreter was used.   HPI Comments: [name] is a 55 y.o. female who presents to the Emergency Department with a PMHx of HTN, COPD, and DM complaining of chest pain that began 1 day ago. Pt also reports SOB, nausea, diarrhea and HA. Yesterday, pt began having sharp, stabbing chest pain radiating to her back. Pt states her pain lasted more than 3 minutes and was spontaneously relieved. Since then, she has felt fatigued. Today, she began having CP again lasting about 3 minutes with associated palpations. In addition, she reports a near syncopal episode. Pt denies any fevers, hemoptysis, or any other symptoms at this time.    Pt states her daughter recently turned 18 two weeks ago which caused her to be uninsured by medicare. Since then, she has not taken any of her medication since she does not have any remaining and can not afford to have them refilled.   Past Medical History  Diagnosis Date  . Hypertension   . Anxiety   . Gallstones   . GERD (gastroesophageal reflux disease)   . COPD (chronic obstructive pulmonary disease) (HCC)   . Headache(784.0)     migraines  . Rash     arms  . Arthritis   . Depression   . Diabetes mellitus without complication (HCC)     Type II  . Dysrhythmia     pt unsure of name of arrythmia - " my heart rate will drop all of a sudden" -no current treatment - atrial flutter  . Osteomyelitis (HCC)     R ankle   Past Surgical History  Procedure Laterality Date  . Ectopic pregnancy surgery  many yrs ago  . Carpal tunnel release      bil  . Cholecystectomy   11/24/2011    Procedure: LAPAROSCOPIC CHOLECYSTECTOMY WITH INTRAOPERATIVE CHOLANGIOGRAM;  Surgeon: Clovis Pu. Cornett, MD;  Location: [facility];  Service: General;  Laterality: N/A;  Laparoscopic Cholecystectomy with Cholangiogram  . Cardiac catheterization N/A 06/24/2015    Procedure: Left Heart Cath and Coronary Angiography;  Surgeon: [name], MD;  Location: [facility] INVASIVE CV LAB;  Service: Cardiovascular;  Laterality: N/A;  . Ankle fracture surgery Right 2015  . Hardware removal Right 07/15/2015    Procedure: RIGHT ANKLE HARDWARE REMOVAL, PLACEMENT OF STIMULAN ANTIBIOTIC BEADS AND PREVENA WOUND VAC.;  Surgeon: [name], MD;  Location: [facility] OR;  Service: Orthopedics;  Laterality: Right;   Family History  Problem Relation Age of Onset  . Breast cancer Sister   . Lung cancer Father    Social History  Substance Use Topics  . Smoking status: Current Every Day Smoker -- 0.50 packs/day for 20 years    Types: Cigarettes  . Smokeless tobacco: Never Used  . Alcohol Use: No   OB History    No data available     Review of Systems  Constitutional: Positive for fatigue. Negative for fever.  Respiratory: Positive for shortness of breath.   Cardiovascular: Positive for chest pain and palpitations.  Gastrointestinal: Positive for nausea and diarrhea.  Neurological: Positive for headaches.  All other systems reviewed and are negative.  Allergies  Biaxin; Clarithromycin; Lisinopril; Sulfa antibiotics; Chlorthalidone; Daptomycin; Amoxicillin-pot clavulanate; Aspirin; Cholestyramine; Dilaudid; Lasix; and Latex  Home Medications   Prior to Admission medications   Medication Sig Start Date End Date Taking? Authorizing Provider  acetaminophen (TYLENOL) 325 MG tablet Take 650 mg by mouth every 6 (six) hours as needed for mild pain.    Historical Provider, MD  albuterol (PROVENTIL HFA;VENTOLIN HFA) 108 (90 BASE) MCG/ACT inhaler Inhale 2 puffs into the lungs every 4 (four) hours as needed for  wheezing. Wheezing    Historical Provider, MD  ALPRAZolam [name]) 0.5 MG tablet Take 1 mg by mouth 2 (two) times daily. Anxiety    Historical Provider, MD  aspirin EC 81 MG tablet Take 1 tablet (81 mg total) by mouth daily. 07/25/15   [name], MD  bisoprolol (ZEBETA) 5 MG tablet Take 0.5 tablets (2.5 mg total) by mouth daily. 07/25/15   [name], MD  diphenhydrAMINE (BENADRYL) 25 MG tablet Take 25 mg by mouth every 6 (six) hours as needed for itching or allergies.    Historical Provider, MD  FLUoxetine (PROZAC) 40 MG capsule Take 40 mg by mouth daily with breakfast.     Historical Provider, MD  glimepiride (AMARYL) 2 MG tablet Take 2 mg by mouth daily with breakfast.     Historical Provider, MD  hydrOXYzine (ATARAX/VISTARIL) 10 MG tablet Take 20 mg by mouth every 6 (six) hours as needed for itching.    Historical Provider, MD  imipramine (TOFRANIL) 25 MG tablet Take 50 mg by mouth at bedtime. Take 2 hours before bedtime    Historical Provider, MD  levofloxacin (LEVAQUIN) 750 MG tablet Take 1 tablet (750 mg total) by mouth daily. Patient not taking: Reported on 09/08/2015 07/17/15   [name], MD  metFORMIN (GLUCOPHAGE) 1000 MG tablet Take 1,000 mg by mouth daily with breakfast. Reported on 09/08/2015    Historical Provider, MD  omeprazole (PRILOSEC) 20 MG capsule Take 20 mg by mouth 2 (two) times daily.     Historical Provider, MD  oxyCODONE-acetaminophen (PERCOCET) 10-325 MG tablet Take 1 tablet by mouth every 6 (six) hours as needed. AS NEEDED FOR PAIN FROM SURGERY 07/01/15   Historical Provider, MD  pravastatin (PRAVACHOL) 40 MG tablet Take 40 mg by mouth daily.    Historical Provider, MD  sitaGLIPtin (JANUVIA) 100 MG tablet Take 100 mg by mouth daily.    Historical Provider, MD  tiotropium (SPIRIVA) 18 MCG inhalation capsule Place 18 mcg into inhaler and inhale daily.    Historical Provider, MD  triamcinolone (KENALOG) 0.025 % cream Apply 1 application topically 2 (two) times  daily.    Historical Provider, MD  valsartan (DIOVAN) 160 MG tablet Take 160 mg by mouth daily with breakfast.     Historical Provider, MD   BP 140/94 mmHg  Pulse 107  Temp(Src) 97.9 F (36.6 C) (Oral)  Resp 18  Ht  (1.702 m)  Wt 163 lb (73.936 kg)  BMI 25.52 kg/m2  SpO2 96%  LMP 07/24/2011   Physical Exam CONSTITUTIONAL: Well developed/well nourished HEAD: Normocephalic/atraumatic EYES: EOMI/PERRL ENMT: Mucous membranes moist NECK: supple no meningeal signs SPINE/BACK:entire spine nontender CV: S1/S2 noted, no murmurs/rubs/gallops noted LUNGS: Lungs are clear to auscultation bilaterally, no apparent distress ABDOMEN: soft, nontender, no rebound or guarding, bowel sounds noted throughout abdomen GU:no cva tenderness NEURO: Pt is awake/alert/appropriate, moves all extremitiesx4.  No facial droop.   EXTREMITIES: pulses normal/equal, full  ROM SKIN: warm, color normal PSYCH: she is anxious  ED Course  Procedures  DIAGNOSTIC STUDIES: Oxygen Saturation is 96% on room air, normal by my interpretation.    COORDINATION OF CARE: 3:25 AM Will order CXR, EKG, and blood work.  Discussed treatment plan with pt and husband at bedside and they agreed to plan.   Pt with cardiac cath in past year that showed widely patent arteries - she has nonischemic cardiomyopathy She has had multiple CP evaluations including multiple CT chest for PE that were negative for similar episodes I offered further workup and d-dimer and possible CT - pt declines as she feels this is most likely due to missing meds She is unable to afford her meds, but will try to get her amaryl filled She has an outpatient f/u next week I doubt ACS/PE/Dissection at this time  Labs Review Labs Reviewed  BASIC METABOLIC PANEL - Abnormal; Notable for the following:    Sodium 132 (*)    Chloride 98 (*)    CO2 21 (*)    Glucose, Bld 437 (*)    All other components within normal limits  CBC - Abnormal; Notable for the  following:    RBC 6.11 (*)    MCV 72.8 (*)    MCH 23.2 (*)    RDW 18.2 (*)    All other components within normal limits  CBG MONITORING, ED - Abnormal; Notable for the following:    Glucose-Capillary 418 (*)    All other components within normal limits  I-STAT TROPOININ, ED    Imaging Review Dg Chest 2 View  10/25/2015  CLINICAL DATA:  Acute onset of left-sided chest pain, radiating to the upper back. Dry cough and nausea. Initial encounter. EXAM: CHEST  2 VIEW COMPARISON:  Chest radiograph performed 07/30/2015 FINDINGS: The lungs are well-aerated and clear. There is no evidence of focal opacification, pleural effusion or pneumothorax. The heart is normal in size; the mediastinal contour is within normal limits. No acute osseous abnormalities are seen. Clips are noted within the right upper quadrant, reflecting prior cholecystectomy. IMPRESSION: No acute cardiopulmonary process seen. Electronically Signed   By: Roanna Raider M.D.   On: 10/25/2015 00:50   I have personally reviewed and evaluated these images and lab results as part of my medical decision-making.   EKG Interpretation   Date/Time:  Friday October 24 2015 23:51:03 EDT Ventricular Rate:  100 PR Interval:  142 QRS Duration: 124 QT Interval:  379 QTC Calculation: 489 R Axis:   -17 Text Interpretation:  Sinus tachycardia Probable left atrial enlargement  Left bundle branch block No significant change since last tracing  Confirmed by Bebe Shaggy  MD, Dorinda Hill ([id number]) on 10/25/2015 12:51:42 AM     Medications  glimepiride (AMARYL) tablet 4 mg (4 mg Oral Given 10/25/15 0448)  LORazepam (ATIVAN) tablet 1 mg (1 mg Oral Given 10/25/15 0418)  acetaminophen (TYLENOL) tablet 650 mg (650 mg Oral Given 10/25/15 0448)    MDM   Final diagnoses:  Chest pain, unspecified chest pain type  Other headache syndrome  Hyperglycemia    Nursing notes including past medical history and social history reviewed and considered in  documentation Labs/vital reviewed myself and considered during evaluation xrays/imaging reviewed by myself and considered during evaluation   I personally performed the services described in this documentation, which was scribed in my presence. The recorded information has been reviewed and is accurate.       [name], MD 10/25/15 [phone]

## 2015-10-26 ENCOUNTER — Encounter (HOSPITAL_COMMUNITY): Payer: Self-pay

## 2015-10-26 DIAGNOSIS — B373 Candidiasis of vulva and vagina: Secondary | ICD-10-CM

## 2015-10-26 DIAGNOSIS — E1165 Type 2 diabetes mellitus with hyperglycemia: Secondary | ICD-10-CM

## 2015-10-26 DIAGNOSIS — R1032 Left lower quadrant pain: Secondary | ICD-10-CM

## 2015-10-26 LAB — URINALYSIS, ROUTINE W REFLEX MICROSCOPIC
BILIRUBIN URINE: NEGATIVE
Glucose, UA: >1000 mg/dL — AB
Hgb urine dipstick: NEGATIVE
Ketones, ur: NEGATIVE mg/dL
LEUKOCYTES UA: NEGATIVE
NITRITE: NEGATIVE
PH: 5.5 (ref 5.0–8.0)
Protein, ur: NEGATIVE mg/dL

## 2015-10-26 LAB — URINE MICROSCOPIC-ADD ON: RBC / HPF: NONE SEEN RBC/hpf (ref 0–5)

## 2015-10-26 LAB — GLUCOSE, CAPILLARY: GLUCOSE-CAPILLARY: 383 mg/dL — AB (ref 65–99)

## 2015-10-26 LAB — WET PREP, GENITAL
Clue Cells Wet Prep HPF POC: NONE SEEN
Sperm: NONE SEEN
TRICH WET PREP: NONE SEEN
WBC, Wet Prep HPF POC: NONE SEEN
YEAST WET PREP: NONE SEEN

## 2015-10-26 MED ORDER — FLUCONAZOLE 150 MG PO TABS
150.0000 mg | ORAL_TABLET | Freq: Once | ORAL | Status: AC
Start: 2015-10-26 — End: 2015-10-26
  Administered 2015-10-26: 150 mg via ORAL
  Filled 2015-10-26: qty 1

## 2015-10-26 MED ORDER — MICONAZOLE NITRATE 2 % VA CREA: 1.0000 | TOPICAL_CREAM | Freq: Every day | VAGINAL | Status: DC

## 2015-10-26 NOTE — Discharge Instructions (Signed)
Abdominal Pain, Adult Many things can cause abdominal pain. Usually, abdominal pain is not caused by a disease and will improve without treatment. It can often be observed and treated at home. Your health care provider will do a physical exam and possibly order blood tests and X-rays to help determine the seriousness of your pain. However, in many cases, more time must pass before a clear cause of the pain can be found. Before that point, your health care provider may not know if you need more testing or further treatment. HOME CARE INSTRUCTIONS Monitor your abdominal pain for any changes. The following actions may help to alleviate any discomfort you are experiencing:  Only take over-the-counter or prescription medicines as directed by your health care provider.  Do not take laxatives unless directed to do so by your health care provider.  Try a clear liquid diet (broth, tea, or water) as directed by your health care provider. Slowly move to a bland diet as tolerated. SEEK MEDICAL CARE IF:  You have unexplained abdominal pain.  You have abdominal pain associated with nausea or diarrhea.  You have pain when you urinate or have a bowel movement.  You experience abdominal pain that wakes you in the night.  You have abdominal pain that is worsened or improved by eating food.  You have abdominal pain that is worsened with eating fatty foods.  You have a fever. SEEK IMMEDIATE MEDICAL CARE IF:  Your pain does not go away within 2 hours.  You keep throwing up (vomiting).  Your pain is felt only in portions of the abdomen, such as the right side or the left lower portion of the abdomen.  You pass bloody or black tarry stools. MAKE SURE YOU:  Understand these instructions.  Will watch your condition.  Will get help right away if you are not doing well or get worse.   This information is not intended to replace advice given to you by your health care provider. Make sure you discuss  any questions you have with your health care provider.   Document Released: 03/24/2005 Document Revised: 03/05/2015 Document Reviewed: 02/21/2013 Elsevier Interactive Patient Education 2016 [employer]  Monilial Vaginitis Vaginitis in a soreness, swelling and redness (inflammation) of the vagina and vulva. Monilial vaginitis is not a sexually transmitted infection. CAUSES  Yeast vaginitis is caused by yeast (candida) that is normally found in your vagina. With a yeast infection, the candida has overgrown in number to a point that upsets the chemical balance. SYMPTOMS   White, thick vaginal discharge.  Swelling, itching, redness and irritation of the vagina and possibly the lips of the vagina (vulva).  Burning or painful urination.  Painful intercourse. DIAGNOSIS  Things that may contribute to monilial vaginitis are:  Postmenopausal and virginal states.  Pregnancy.  Infections.  Being tired, sick or stressed, especially if you had monilial vaginitis in the past.  Diabetes. Good control will help lower the chance.  Birth control pills.  Tight fitting garments.  Using bubble bath, feminine sprays, douches or deodorant tampons.  Taking certain medications that kill germs (antibiotics).  Sporadic recurrence can occur if you become ill. TREATMENT  Your caregiver will give you medication.  There are several kinds of anti monilial vaginal creams and suppositories specific for monilial vaginitis. For recurrent yeast infections, use a suppository or cream in the vagina 2 times a week, or as directed.  Anti-monilial or steroid cream for the itching or irritation of the vulva may also be used. Get  your caregiver's permission. °· Painting the vagina with methylene blue solution may help if the monilial cream does not work. °· Eating yogurt may help prevent monilial vaginitis. °HOME CARE INSTRUCTIONS  °· Finish all medication as prescribed. °· Do not have sex until treatment is  completed or after your caregiver tells you it is okay. °· Take warm sitz baths. °· Do not douche. °· Do not use tampons, especially scented ones. °· Wear cotton underwear. °· Avoid tight pants and panty hose. °· Tell your sexual partner that you have a yeast infection. They should go to their caregiver if they have symptoms such as mild rash or itching. °· Your sexual partner should be treated as well if your infection is difficult to eliminate. °· Practice safer sex. Use condoms. °· Some vaginal medications cause latex condoms to fail. Vaginal medications that harm condoms are: °¨ Cleocin cream. °¨ Butoconazole (Femstat®). °¨ Terconazole (Terazol®) vaginal suppository. °¨ Miconazole (Monistat®) (may be purchased over the counter). °SEEK MEDICAL CARE IF:  °· You have a temperature by mouth above 102° F (38.9° C). °· The infection is getting worse after 2 days of treatment. °· The infection is not getting better after 3 days of treatment. °· You develop blisters in or around your vagina. °· You develop vaginal bleeding, and it is not your menstrual period. °· You have pain when you urinate. °· You develop intestinal problems. °· You have pain with sexual intercourse. °  °This information is not intended to replace advice given to you by your health care provider. Make sure you discuss any questions you have with your health care provider. °  °Document Released: 03/24/2005 Document Revised: 09/06/2011 Document Reviewed: 12/16/2014 °Elsevier Interactive Patient Education ©2016 [employer] ° °

## 2015-10-26 NOTE — MAU Provider Note (Signed)
Chief Complaint: Dysuria and Abdominal Pain   First Provider Initiated Contact with Patient 10/26/15 0059     SUBJECTIVE HPI: [name] is a 55 y.o. [phone] female who presents to [facility] reporting burning with urination, frequency of urination and left lower quadrant/groin pain for several days. Has type 2 diabetes and hypertension. Ran out of medications when she lost her Medicaid. Was seen at [facility] ED within the last 24 hours for chest pain and was an emergency supply of her medications. Takes Januvia and glimepiride for diabetes and had her doses yesterday.  Chronic diarrhea, for which she has already been evaluated. No change in this problem.   Location: Left lower quadrant/groin Quality: Burning Severity: Moderate Duration: few days Context: when sitting on toilet Course: worsening Timing: Intermittent Modifying factors: Worse when sitting, using bathroom. Better w/ standing or lying down.  Associated signs and symptoms: Pos for chronic diarrhea. Burning w/ urination. Neg for fever, chills, N/V, constipation, hematuria, urgency ,frequency, flank pain, vaginal bleeding. Occasionally has a small of bleeding from hemorrhoids, but none recently.   Past Medical History  Diagnosis Date  . Hypertension   . Anxiety   . Gallstones   . GERD (gastroesophageal reflux disease)   . COPD (chronic obstructive pulmonary disease) (HCC)   . Headache(784.0)     migraines  . Rash     arms  . Arthritis   . Depression   . Diabetes mellitus without complication (HCC)     Type II  . Dysrhythmia     pt unsure of name of arrythmia - " my heart rate will drop all of a sudden" -no current treatment - atrial flutter  . Osteomyelitis (HCC)     R ankle  . Fibrillation, atrial (HCC)    OB History  Gravida Para Term Preterm AB SAB TAB Ectopic Multiple Living  9    5 4  1  4     # Outcome Date GA Lbr Len/2nd Weight Sex Delivery Anes PTL Lv  9 SAB           8 SAB           7  SAB           6 SAB           5 Ectopic           4 Gravida      Vag-Spont   Y  3 Gravida      Vag-Spont   Y  2 Gravida      Vag-Spont   Y  1 Gravida      Vag-Spont   Y     Past Surgical History  Procedure Laterality Date  . Ectopic pregnancy surgery  many yrs ago  . Carpal tunnel release      bil  . Cholecystectomy  11/24/2011    Procedure: LAPAROSCOPIC CHOLECYSTECTOMY WITH INTRAOPERATIVE CHOLANGIOGRAM;  Surgeon: Clovis Pu. Cornett, MD;  Location: [facility] ORS;  Service: General;  Laterality: N/A;  Laparoscopic Cholecystectomy with Cholangiogram  . Cardiac catheterization N/A 06/24/2015    Procedure: Left Heart Cath and Coronary Angiography;  Surgeon: [name], MD;  Location: [facility] INVASIVE CV LAB;  Service: Cardiovascular;  Laterality: N/A;  . Ankle fracture surgery Right 2015  . Hardware removal Right 07/15/2015    Procedure: RIGHT ANKLE HARDWARE REMOVAL, PLACEMENT OF STIMULAN ANTIBIOTIC BEADS AND PREVENA WOUND VAC.;  Surgeon: [name], MD;  Location: [facility] OR;  Service: Orthopedics;  Laterality: Right;  Social History   Social History  . Marital Status: Divorced    Spouse Name: N/A  . Number of Children: N/A  . Years of Education: N/A   Occupational History  . Not on file.   Social History Main Topics  . Smoking status: Current Every Day Smoker -- 0.50 packs/day for 20 years    Types: Cigarettes  . Smokeless tobacco: Never Used  . Alcohol Use: No  . Drug Use: No  . Sexual Activity: Not on file   Other Topics Concern  . Not on file   Social History Narrative   No current facility-administered medications on file prior to encounter.   Current Outpatient Prescriptions on File Prior to Encounter  Medication Sig Dispense Refill  . albuterol (PROVENTIL HFA;VENTOLIN HFA) 108 (90 BASE) MCG/ACT inhaler Inhale 2 puffs into the lungs every 4 (four) hours as needed for wheezing. Wheezing    . ALPRAZolam (XANAX) 0.5 MG tablet Take 1 mg by mouth 2 (two) times daily. Anxiety     . aspirin EC 81 MG tablet Take 1 tablet (81 mg total) by mouth daily.    . bisoprolol (ZEBETA) 5 MG tablet Take 0.5 tablets (2.5 mg total) by mouth daily. 45 tablet 3  . diphenhydrAMINE (BENADRYL) 25 MG tablet Take 25-50 mg by mouth every 6 (six) hours as needed for itching or allergies.     Marland Kitchen FLUoxetine (PROZAC) 40 MG capsule Take 40 mg by mouth daily with breakfast.     . glimepiride (AMARYL) 4 MG tablet Take 1 tablet (4 mg total) by mouth daily with breakfast. 7 tablet 0  . hydrOXYzine (ATARAX/VISTARIL) 10 MG tablet Take 20 mg by mouth every 6 (six) hours as needed for itching.    Marland Kitchen imipramine (TOFRANIL) 25 MG tablet Take 50 mg by mouth at bedtime. Take 2 hours before bedtime    . omeprazole (PRILOSEC) 20 MG capsule Take 20 mg by mouth 2 (two) times daily.     Marland Kitchen oxyCODONE-acetaminophen (PERCOCET/ROXICET) 5-325 MG tablet Take 1-2 tablets by mouth every 4 (four) hours as needed for severe pain (for pain.).    Marland Kitchen pravastatin (PRAVACHOL) 40 MG tablet Take 40 mg by mouth daily.    . sitaGLIPtin (JANUVIA) 100 MG tablet Take 100 mg by mouth every other day.     . tiotropium (SPIRIVA) 18 MCG inhalation capsule Place 18 mcg into inhaler and inhale daily.    . valsartan (DIOVAN) 160 MG tablet Take 160 mg by mouth daily with breakfast.      Allergies  Allergen Reactions  . Biaxin [Clarithromycin] Anaphylaxis and Swelling  . Clarithromycin Shortness Of Breath and Swelling  . Lisinopril Swelling and Cough    Lip edema  . Sulfa Antibiotics Anaphylaxis, Shortness Of Breath, Swelling and Hypertension  . Chlorthalidone Nausea And Vomiting and Other (See Comments)    Clammy, Tachycardia, Headache   . Daptomycin Nausea And Vomiting  . Amoxicillin-Pot Clavulanate Diarrhea    Has patient had a PCN reaction causing immediate rash, facial/tongue/throat swelling, SOB or lightheadedness with hypotension:No Has patient had a PCN reaction causing severe rash involving mucus membranes or skin necrosis:No Has  patient had a PCN reaction that required hospitalization:No Has patient had a PCN reaction occurring within the last 10 years:Yes If all of the above answers are "NO", then may proceed with Cephalosporin use.   . Aspirin Nausea And Vomiting and Rash  . Cholestyramine Nausea And Vomiting  . Dilaudid [Hydromorphone Hcl] Nausea And Vomiting  . Lasix [Furosemide]  Nausea And Vomiting and Other (See Comments)    Headache   . Latex Hives and Rash    I have reviewed the past Medical Hx, Surgical Hx, Social Hx, Allergies and Medications.   Review of Systems  Constitutional: Negative for fever, chills, appetite change and fatigue.  Gastrointestinal: Positive for abdominal pain and diarrhea. Negative for nausea, vomiting, constipation, blood in stool, abdominal distention and anal bleeding.  Endocrine: Positive for polydipsia and polyuria.  Genitourinary: Positive for dysuria and vaginal discharge. Negative for urgency, frequency, hematuria, flank pain, vaginal bleeding, genital sores, pelvic pain and dyspareunia.  Musculoskeletal: Negative for back pain.    OBJECTIVE Patient Vitals for the past 24 hrs:  BP Temp Pulse Resp Height Weight  10/25/15 2308 131/81 mmHg 98.2 F (36.8 C) 107 18 5' 7.5" (1.715 m) 166 lb (75.297 kg)   Constitutional: Well-developed, well-nourished female in no acute distress.  Cardiovascular: Mild tachycardia Respiratory: normal rate and effort.  GI: Abd soft, non-tender. Neg guarding, rebound tenderness or mass. Pos BS x 4 MS: Extremities nontender, no edema, normal ROM Neurologic: Alert and oriented x 4.  GU: Neg CVAT.  SPECULUM EXAM: NEFG except for erythema, satellite lesion and excoriation of vulva. Small amount of curd-like, odorless white discharge, no blood noted, cervix clean  BIMANUAL: cervix closed; uterus normal size, no adnexal tenderness or masses. No CMT.  LAB RESULTS Results for orders placed or performed during the hospital encounter of 10/25/15  (from the past 24 hour(s))  Urinalysis, Routine w reflex microscopic (not at [facility])     Status: Abnormal   Collection Time: 10/25/15 11:15 PM  Result Value Ref Range   Color, Urine YELLOW YELLOW   APPearance CLEAR CLEAR   Specific Gravity, Urine <1.005 (L) 1.005 - 1.030   pH 5.5 5.0 - 8.0   Glucose, UA >1000 (A) NEGATIVE mg/dL   Hgb urine dipstick NEGATIVE NEGATIVE   Bilirubin Urine NEGATIVE NEGATIVE   Ketones, ur NEGATIVE NEGATIVE mg/dL   Protein, ur NEGATIVE NEGATIVE mg/dL   Nitrite NEGATIVE NEGATIVE   Leukocytes, UA NEGATIVE NEGATIVE  Urine microscopic-add on     Status: Abnormal   Collection Time: 10/25/15 11:15 PM  Result Value Ref Range   Squamous Epithelial / LPF 0-5 (A) NONE SEEN   WBC, UA 0-5 0 - 5 WBC/hpf   RBC / HPF NONE SEEN 0 - 5 RBC/hpf   Bacteria, UA RARE (A) NONE SEEN  Wet prep, genital     Status: None   Collection Time: 10/26/15  1:08 AM  Result Value Ref Range   Yeast Wet Prep HPF POC NONE SEEN NONE SEEN   Trich, Wet Prep NONE SEEN NONE SEEN   Clue Cells Wet Prep HPF POC NONE SEEN NONE SEEN   WBC, Wet Prep HPF POC NONE SEEN NONE SEEN   Sperm NONE SEEN   Glucose, capillary     Status: Abnormal   Collection Time: 10/26/15  1:21 AM  Result Value Ref Range   Glucose-Capillary 383 (H) 65 - 99 mg/dL    IMAGING NA  MAU COURSE UA, UPT, Wet prep, GC/Chlamydia.  MDM - 55 year-old non-pregnant female w/ dysuria 2/2 VVC likely from uncontrolled DM. - Newly restarted on DM meds. Has meds at home, PCP and appt w/ Medicaid office to resolve issue of obtaining meds.  - Acute LLQ pain. No emergent condition apparent. Appropriate for OP [place].   ASSESSMENT 1. LLQ pain   2. Vulvar candidiasis   3. Uncontrolled type 2 diabetes  mellitus with hyperglycemia, without long-term current use of insulin (HCC)    PLAN Discharge home in stable condition. Abd pain Precautions In-basket message sent to Gyn clinic for F/U appt to review [place] result, discuss further work-up PRN and  Pap.  Follow-up Information    Schedule an appointment as soon as possible for a visit with Lupe Carney, MD.   Specialty:  Family Medicine   Why:  Primary Care   Contact information:   [address] [place] [address] [phone]       Follow up with [facility] EMERGENCY DEPARTMENT.   Specialty:  Emergency Medicine   Why:  As needed in emergencies   Contact information:   [address] [id number] mc [place] [address] [phone]      Follow up with [facility].   Specialty:  Obstetrics and Gynecology   Why:  will call you to schedule appointment to review ultrasound results and for Pap smear   Contact information:   [address] [place] [address] [phone]      Follow up with [facility].   Specialty:  Radiology   Why:  Will call you to schedule pelvic ultrasound   Contact information:   [address] [id number] mc [place] [address] [phone]        Medication List    STOP taking these medications        diphenhydrAMINE 25 MG tablet  Commonly known as:  BENADRYL     imipramine 25 MG tablet  Commonly known as:  TOFRANIL     loperamide 2 MG tablet  Commonly known as:  IMODIUM A-D      TAKE these medications        albuterol 108 (90 Base) MCG/ACT inhaler  Commonly known as:  PROVENTIL HFA;VENTOLIN HFA  Inhale 2 puffs into the lungs every 4 (four) hours as needed for wheezing. Wheezing     ALPRAZolam 0.5 MG tablet  Commonly known as:  XANAX  Take 1 mg by mouth 2 (two) times daily. Anxiety     aspirin EC 81 MG tablet  Take 1 tablet (81 mg total) by mouth daily.     bisoprolol 5 MG tablet  Commonly known as:  ZEBETA  Take 0.5 tablets (2.5 mg total) by mouth daily.     FLUoxetine 40 MG capsule  Commonly known as:  PROZAC  Take 40 mg by mouth at bedtime.     glimepiride 4 MG tablet  Commonly  known as:  AMARYL  Take 1 tablet (4 mg total) by mouth daily with breakfast.     hydrOXYzine 10 MG tablet  Commonly known as:  ATARAX/VISTARIL  Take 10 mg by mouth every 6 (six) hours as needed for itching.     omeprazole 20 MG capsule  Commonly known as:  PRILOSEC  Take 20 mg by mouth 2 (two) times daily.     oxyCODONE-acetaminophen 5-325 MG tablet  Commonly known as:  PERCOCET/ROXICET  Take 1-2 tablets by mouth every 8 (eight) hours as needed for severe pain (for pain.).     pravastatin 40 MG tablet  Commonly known as:  PRAVACHOL  Take 40 mg by mouth daily.     sitaGLIPtin 100 MG tablet  Commonly known as:  JANUVIA  Take 100 mg by mouth daily.     tiotropium 18 MCG inhalation capsule  Commonly known as:  SPIRIVA  Place 18 mcg  into inhaler and inhale daily.     valsartan 160 MG tablet  Commonly known as:  DIOVAN  Take 160 mg by mouth daily with breakfast.        Dorathy Kinsman, CNM 10/26/2015  1:33 AM

## 2015-10-26 NOTE — Progress Notes (Signed)
To add to subjective note:  Pt states she has had 'diarrhea for years', has went to stomach doctor, had colonoscopy, and takes Imodium and Benadryl to help, has a large hemerrhoid that occas bleeds with bm, reports stools are soft, loose, sometimes watery.

## 2015-10-27 LAB — GC/CHLAMYDIA PROBE AMP (~~LOC~~) NOT AT ARMC
CHLAMYDIA, DNA PROBE: NEGATIVE
Neisseria Gonorrhea: NEGATIVE

## 2015-11-01 ENCOUNTER — Encounter (HOSPITAL_COMMUNITY): Payer: Self-pay | Admitting: *Deleted

## 2015-11-01 ENCOUNTER — Emergency Department (HOSPITAL_COMMUNITY): Payer: Medicaid Other

## 2015-11-01 ENCOUNTER — Observation Stay (HOSPITAL_COMMUNITY)
Admission: EM | Admit: 2015-11-01 | Discharge: 2015-11-03 | Disposition: A | Discharge status: Home / Self Care | Payer: Medicaid Other | Attending: Internal Medicine | Admitting: Internal Medicine

## 2015-11-01 DIAGNOSIS — K649 Unspecified hemorrhoids: Secondary | ICD-10-CM

## 2015-11-01 DIAGNOSIS — K921 Melena: Secondary | ICD-10-CM | POA: Diagnosis present

## 2015-11-01 DIAGNOSIS — F329 Major depressive disorder, single episode, unspecified: Secondary | ICD-10-CM | POA: Diagnosis present

## 2015-11-01 DIAGNOSIS — Z79899 Other long term (current) drug therapy: Secondary | ICD-10-CM | POA: Insufficient documentation

## 2015-11-01 DIAGNOSIS — K644 Residual hemorrhoidal skin tags: Secondary | ICD-10-CM | POA: Insufficient documentation

## 2015-11-01 DIAGNOSIS — K219 Gastro-esophageal reflux disease without esophagitis: Secondary | ICD-10-CM | POA: Insufficient documentation

## 2015-11-01 DIAGNOSIS — F419 Anxiety disorder, unspecified: Secondary | ICD-10-CM | POA: Diagnosis present

## 2015-11-01 DIAGNOSIS — R05 Cough: Secondary | ICD-10-CM | POA: Insufficient documentation

## 2015-11-01 DIAGNOSIS — F32A Depression, unspecified: Secondary | ICD-10-CM | POA: Diagnosis present

## 2015-11-01 DIAGNOSIS — E876 Hypokalemia: Secondary | ICD-10-CM | POA: Insufficient documentation

## 2015-11-01 DIAGNOSIS — I10 Essential (primary) hypertension: Secondary | ICD-10-CM | POA: Diagnosis not present

## 2015-11-01 DIAGNOSIS — F17210 Nicotine dependence, cigarettes, uncomplicated: Secondary | ICD-10-CM | POA: Insufficient documentation

## 2015-11-01 DIAGNOSIS — Z7984 Long term (current) use of oral hypoglycemic drugs: Secondary | ICD-10-CM | POA: Insufficient documentation

## 2015-11-01 DIAGNOSIS — E119 Type 2 diabetes mellitus without complications: Secondary | ICD-10-CM | POA: Diagnosis not present

## 2015-11-01 DIAGNOSIS — K648 Other hemorrhoids: Principal | ICD-10-CM | POA: Insufficient documentation

## 2015-11-01 DIAGNOSIS — I5022 Chronic systolic (congestive) heart failure: Secondary | ICD-10-CM | POA: Diagnosis present

## 2015-11-01 DIAGNOSIS — J449 Chronic obstructive pulmonary disease, unspecified: Secondary | ICD-10-CM | POA: Diagnosis present

## 2015-11-01 DIAGNOSIS — I4891 Unspecified atrial fibrillation: Secondary | ICD-10-CM | POA: Insufficient documentation

## 2015-11-01 DIAGNOSIS — D62 Acute posthemorrhagic anemia: Secondary | ICD-10-CM

## 2015-11-01 DIAGNOSIS — E1165 Type 2 diabetes mellitus with hyperglycemia: Secondary | ICD-10-CM | POA: Insufficient documentation

## 2015-11-01 DIAGNOSIS — I110 Hypertensive heart disease with heart failure: Secondary | ICD-10-CM | POA: Insufficient documentation

## 2015-11-01 DIAGNOSIS — Z7982 Long term (current) use of aspirin: Secondary | ICD-10-CM | POA: Insufficient documentation

## 2015-11-01 HISTORY — DX: Chronic systolic (congestive) heart failure: I50.22

## 2015-11-01 LAB — LIPASE, BLOOD: Lipase: 99 U/L — ABNORMAL HIGH (ref 11–51)

## 2015-11-01 LAB — COMPREHENSIVE METABOLIC PANEL
ALBUMIN: 3.2 g/dL — AB (ref 3.5–5.0)
ALT: 49 U/L (ref 14–54)
AST: 41 U/L (ref 15–41)
Alkaline Phosphatase: 108 U/L (ref 38–126)
Anion gap: 12 (ref 5–15)
BILIRUBIN TOTAL: 0.4 mg/dL (ref 0.3–1.2)
BUN: <5 mg/dL — ABNORMAL LOW (ref 6–20)
CO2: 20 mmol/L — ABNORMAL LOW (ref 22–32)
CREATININE: 0.75 mg/dL (ref 0.44–1.00)
Calcium: 8.9 mg/dL (ref 8.9–10.3)
Chloride: 102 mmol/L (ref 101–111)
GFR calc Af Amer: >60 mL/min (ref >60)
GLUCOSE: 566 mg/dL — AB (ref 65–99)
POTASSIUM: 4 mmol/L (ref 3.5–5.1)
Sodium: 134 mmol/L — ABNORMAL LOW (ref 135–145)
TOTAL PROTEIN: 6.1 g/dL — AB (ref 6.5–8.1)

## 2015-11-01 LAB — URINE MICROSCOPIC-ADD ON
RBC / HPF: NONE SEEN RBC/hpf (ref 0–5)
Squamous Epithelial / LPF: NONE SEEN
WBC, UA: NONE SEEN WBC/hpf (ref 0–5)

## 2015-11-01 LAB — CBC
HEMATOCRIT: 37 % (ref 36.0–46.0)
Hemoglobin: 11.3 g/dL — ABNORMAL LOW (ref 12.0–15.0)
MCH: 22.9 pg — ABNORMAL LOW (ref 26.0–34.0)
MCHC: 30.5 g/dL (ref 30.0–36.0)
MCV: 74.9 fL — AB (ref 78.0–100.0)
PLATELETS: 155 10*3/uL (ref 150–400)
RBC: 4.94 MIL/uL (ref 3.87–5.11)
RDW: 19.2 % — AB (ref 11.5–15.5)
WBC: 8.6 10*3/uL (ref 4.0–10.5)

## 2015-11-01 LAB — URINALYSIS, ROUTINE W REFLEX MICROSCOPIC
BILIRUBIN URINE: NEGATIVE
Hgb urine dipstick: NEGATIVE
KETONES UR: NEGATIVE mg/dL
Leukocytes, UA: NEGATIVE
Nitrite: NEGATIVE
PH: 5.5 (ref 5.0–8.0)
PROTEIN: NEGATIVE mg/dL
Specific Gravity, Urine: 1.04 — ABNORMAL HIGH (ref 1.005–1.030)

## 2015-11-01 LAB — POC OCCULT BLOOD, ED: Fecal Occult Bld: POSITIVE — AB

## 2015-11-01 MED ORDER — IOPAMIDOL (ISOVUE-300) INJECTION 61%
INTRAVENOUS | Status: AC
  Administered 2015-11-01: 100 mL
  Filled 2015-11-01: qty 100

## 2015-11-01 MED ORDER — PANTOPRAZOLE SODIUM 40 MG PO TBEC
40.0000 mg | DELAYED_RELEASE_TABLET | Freq: Every day | ORAL | Status: DC
  Administered 2015-11-02 – 2015-11-03 (×2): 40 mg via ORAL
  Filled 2015-11-01 (×2): qty 1

## 2015-11-01 MED ORDER — DIPHENHYDRAMINE HCL 25 MG PO CAPS
25.0000 mg | ORAL_CAPSULE | Freq: Four times a day (QID) | ORAL | Status: DC | PRN
  Administered 2015-11-02: 50 mg via ORAL
  Filled 2015-11-01: qty 2

## 2015-11-01 MED ORDER — PRAVASTATIN SODIUM 40 MG PO TABS
40.0000 mg | ORAL_TABLET | Freq: Every day | ORAL | Status: DC
  Administered 2015-11-02 – 2015-11-03 (×2): 40 mg via ORAL
  Filled 2015-11-01 (×2): qty 1

## 2015-11-01 MED ORDER — INSULIN ASPART 100 UNIT/ML ~~LOC~~ SOLN
10.0000 [IU] | Freq: Once | SUBCUTANEOUS | Status: AC
  Administered 2015-11-01: 10 [IU] via SUBCUTANEOUS
  Filled 2015-11-01: qty 1

## 2015-11-01 MED ORDER — SODIUM CHLORIDE 0.9 % IV BOLUS (SEPSIS)
1000.0000 mL | Freq: Once | INTRAVENOUS | Status: AC
  Administered 2015-11-01: 1000 mL via INTRAVENOUS

## 2015-11-01 MED ORDER — IMIPRAMINE HCL 50 MG PO TABS: 50.0000 mg | ORAL_TABLET | Freq: Every day | ORAL | Status: DC

## 2015-11-01 MED ORDER — DIPHENHYDRAMINE HCL 25 MG PO TABS
25.0000 mg | ORAL_TABLET | Freq: Four times a day (QID) | ORAL | Status: DC | PRN
  Filled 2015-11-01: qty 2

## 2015-11-01 MED ORDER — ASPIRIN EC 81 MG PO TBEC: 81.0000 mg | DELAYED_RELEASE_TABLET | Freq: Every day | ORAL | Status: DC

## 2015-11-01 MED ORDER — BISOPROLOL FUMARATE 5 MG PO TABS
2.5000 mg | ORAL_TABLET | Freq: Every day | ORAL | Status: DC
  Administered 2015-11-02 – 2015-11-03 (×2): 2.5 mg via ORAL
  Filled 2015-11-01 (×2): qty 1

## 2015-11-01 MED ORDER — IRBESARTAN 150 MG PO TABS
150.0000 mg | ORAL_TABLET | Freq: Every day | ORAL | Status: DC
  Administered 2015-11-02 – 2015-11-03 (×2): 150 mg via ORAL
  Filled 2015-11-01 (×3): qty 1

## 2015-11-01 MED ORDER — PANTOPRAZOLE SODIUM 40 MG IV SOLR
40.0000 mg | Freq: Once | INTRAVENOUS | Status: AC
  Administered 2015-11-01: 40 mg via INTRAVENOUS
  Filled 2015-11-01: qty 40

## 2015-11-01 MED ORDER — ONDANSETRON HCL 4 MG/2ML IJ SOLN
4.0000 mg | Freq: Once | INTRAMUSCULAR | Status: AC
  Administered 2015-11-01: 4 mg via INTRAVENOUS
  Filled 2015-11-01: qty 2

## 2015-11-01 MED ORDER — INSULIN ASPART 100 UNIT/ML ~~LOC~~ SOLN
0.0000 [IU] | SUBCUTANEOUS | Status: DC
  Administered 2015-11-02: 7 [IU] via SUBCUTANEOUS
  Administered 2015-11-02 (×2): 5 [IU] via SUBCUTANEOUS
  Administered 2015-11-02: 2 [IU] via SUBCUTANEOUS
  Administered 2015-11-02: 5 [IU] via SUBCUTANEOUS
  Administered 2015-11-03 (×2): 3 [IU] via SUBCUTANEOUS
  Administered 2015-11-03: 2 [IU] via SUBCUTANEOUS

## 2015-11-01 MED ORDER — ALBUTEROL SULFATE (2.5 MG/3ML) 0.083% IN NEBU: 2.5000 mg | INHALATION_SOLUTION | RESPIRATORY_TRACT | Status: DC | PRN

## 2015-11-01 MED ORDER — FLUOXETINE HCL 20 MG PO CAPS
40.0000 mg | ORAL_CAPSULE | Freq: Every day | ORAL | Status: DC
Start: 2015-11-02 — End: 2015-11-02

## 2015-11-01 MED ORDER — OXYCODONE-ACETAMINOPHEN 5-325 MG PO TABS
1.0000 | ORAL_TABLET | ORAL | Status: DC | PRN
  Administered 2015-11-02: 2 via ORAL
  Filled 2015-11-01: qty 2

## 2015-11-01 MED ORDER — ALPRAZOLAM 0.5 MG PO TABS
1.0000 mg | ORAL_TABLET | Freq: Two times a day (BID) | ORAL | Status: DC
  Administered 2015-11-02 – 2015-11-03 (×4): 1 mg via ORAL
  Filled 2015-11-01 (×4): qty 2

## 2015-11-01 MED ORDER — ALBUTEROL SULFATE HFA 108 (90 BASE) MCG/ACT IN AERS: 2.0000 | INHALATION_SPRAY | RESPIRATORY_TRACT | Status: DC | PRN

## 2015-11-01 MED ORDER — HYDROXYZINE HCL 10 MG PO TABS
20.0000 mg | ORAL_TABLET | Freq: Four times a day (QID) | ORAL | Status: DC | PRN
  Administered 2015-11-02 – 2015-11-03 (×4): 20 mg via ORAL
  Filled 2015-11-01 (×4): qty 2

## 2015-11-01 MED ORDER — PANTOPRAZOLE SODIUM 40 MG PO TBEC: 40.0000 mg | DELAYED_RELEASE_TABLET | Freq: Every day | ORAL | Status: DC

## 2015-11-01 MED ORDER — TIOTROPIUM BROMIDE MONOHYDRATE 18 MCG IN CAPS
18.0000 ug | ORAL_CAPSULE | Freq: Every day | RESPIRATORY_TRACT | Status: DC
  Administered 2015-11-02 – 2015-11-03 (×2): 18 ug via RESPIRATORY_TRACT
  Filled 2015-11-01: qty 5

## 2015-11-01 MED ORDER — MORPHINE SULFATE (PF) 4 MG/ML IV SOLN
4.0000 mg | Freq: Once | INTRAVENOUS | Status: AC
  Administered 2015-11-01: 4 mg via INTRAVENOUS
  Filled 2015-11-01: qty 1

## 2015-11-01 NOTE — ED Notes (Signed)
Pt returned from CT °

## 2015-11-01 NOTE — ED Provider Notes (Signed)
CSN: [id number]     Arrival date & time 11/01/15  1905 History   First MD Initiated Contact with Patient 11/01/15 1921     Chief Complaint  Patient presents with  . Rectal Bleeding  . Abdominal Pain     (Consider location/radiation/quality/duration/timing/severity/associated sxs/prior Treatment) HPI Comments: Patient presents to the emergency department with chief complaint of rectal bleeding and left lower quadrant abdominal pain. Patient states that the symptoms started 2 days ago. She states that the pain feels like a stabbing and a burning sensation in the left lower side of her abdomen. She denies ever having felt this before. She has had some associated lightheadedness and dizziness and fatigue. She reports her last colonoscopy was about 3 years ago, and was normal. She states that she has had a history of hemorrhoids, and thinks that she has some now. She reports having some blood in her stools. She reports that it is bright red with some darker clots. She denies any fevers or chills. Denies any nausea or vomiting. Her symptoms are worsened with palpation. There are no other modifying factors.  The history is provided by the patient. No language interpreter was used.    Past Medical History  Diagnosis Date  . Hypertension   . Anxiety   . Gallstones   . GERD (gastroesophageal reflux disease)   . COPD (chronic obstructive pulmonary disease) (HCC)   . Headache(784.0)     migraines  . Rash     arms  . Arthritis   . Depression   . Diabetes mellitus without complication (HCC)     Type II  . Dysrhythmia     pt unsure of name of arrythmia - " my heart rate will drop all of a sudden" -no current treatment - atrial flutter  . Osteomyelitis (HCC)     R ankle  . Fibrillation, atrial [facility])    Past Surgical History  Procedure Laterality Date  . Ectopic pregnancy surgery  many yrs ago  . Carpal tunnel release      bil  . Cholecystectomy  11/24/2011    Procedure: LAPAROSCOPIC  CHOLECYSTECTOMY WITH INTRAOPERATIVE CHOLANGIOGRAM;  Surgeon: Clovis Pu. Cornett, MD;  Location: [facility] ORS;  Service: General;  Laterality: N/A;  Laparoscopic Cholecystectomy with Cholangiogram  . Cardiac catheterization N/A 06/24/2015    Procedure: Left Heart Cath and Coronary Angiography;  Surgeon: [name], MD;  Location: [facility] INVASIVE CV LAB;  Service: Cardiovascular;  Laterality: N/A;  . Ankle fracture surgery Right 2015  . Hardware removal Right 07/15/2015    Procedure: RIGHT ANKLE HARDWARE REMOVAL, PLACEMENT OF STIMULAN ANTIBIOTIC BEADS AND PREVENA WOUND VAC.;  Surgeon: [name], MD;  Location: [facility] OR;  Service: Orthopedics;  Laterality: Right;   Family History  Problem Relation Age of Onset  . Breast cancer Sister   . Lung cancer Father    Social History  Substance Use Topics  . Smoking status: Current Every Day Smoker -- 0.50 packs/day for 20 years    Types: Cigarettes  . Smokeless tobacco: Never Used  . Alcohol Use: No   OB History    Gravida Para Term Preterm AB TAB SAB Ectopic Multiple Living   9    5  4 1  4      Review of Systems  Constitutional: Negative for fever and chills.  Respiratory: Negative for shortness of breath.   Cardiovascular: Negative for chest pain.  Gastrointestinal: Positive for abdominal pain and blood in stool. Negative for nausea, vomiting, diarrhea and  constipation.  Genitourinary: Negative for dysuria.  All other systems reviewed and are negative.     Allergies  Biaxin; Clarithromycin; Lisinopril; Sulfa antibiotics; Chlorthalidone; Daptomycin; Amoxicillin-pot clavulanate; Aspirin; Cholestyramine; Dilaudid; Lasix; and Latex  Home Medications   Prior to Admission medications   Medication Sig Start Date End Date Taking? Authorizing Provider  albuterol (PROVENTIL HFA;VENTOLIN HFA) 108 (90 BASE) MCG/ACT inhaler Inhale 2 puffs into the lungs every 4 (four) hours as needed for wheezing. Wheezing    Historical Provider, MD  ALPRAZolam  [name]) 0.5 MG tablet Take 1 mg by mouth 2 (two) times daily. Anxiety    Historical Provider, MD  aspirin EC 81 MG tablet Take 1 tablet (81 mg total) by mouth daily. 07/25/15   [name], MD  bisoprolol (ZEBETA) 5 MG tablet Take 0.5 tablets (2.5 mg total) by mouth daily. 07/25/15   [name], MD  diphenhydrAMINE (BENADRYL) 25 MG tablet Take 25-50 mg by mouth every 6 (six) hours as needed for itching or allergies.     Historical Provider, MD  FLUoxetine (PROZAC) 40 MG capsule Take 40 mg by mouth daily with breakfast.     Historical Provider, MD  glimepiride (AMARYL) 4 MG tablet Take 1 tablet (4 mg total) by mouth daily with breakfast. 10/25/15   [name], MD  hydrOXYzine (ATARAX/VISTARIL) 10 MG tablet Take 20 mg by mouth every 6 (six) hours as needed for itching.    Historical Provider, MD  imipramine (TOFRANIL) 25 MG tablet Take 50 mg by mouth at bedtime. Take 2 hours before bedtime    Historical Provider, MD  loperamide (IMODIUM A-D) 2 MG tablet Take 2 mg by mouth 4 (four) times daily as needed for diarrhea or loose stools.    Historical Provider, MD  miconazole (MONISTAT 7) 2 % vaginal cream Place 1 Applicatorful vaginally at bedtime. 10/26/15   Dorathy Kinsman, CNM  omeprazole (PRILOSEC) 20 MG capsule Take 20 mg by mouth 2 (two) times daily.     Historical Provider, MD  oxyCODONE-acetaminophen (PERCOCET/ROXICET) 5-325 MG tablet Take 1-2 tablets by mouth every 4 (four) hours as needed for severe pain (for pain.).    Historical Provider, MD  pravastatin (PRAVACHOL) 40 MG tablet Take 40 mg by mouth daily.    Historical Provider, MD  sitaGLIPtin (JANUVIA) 100 MG tablet Take 100 mg by mouth every other day.     Historical Provider, MD  tiotropium (SPIRIVA) 18 MCG inhalation capsule Place 18 mcg into inhaler and inhale daily.    Historical Provider, MD  valsartan (DIOVAN) 160 MG tablet Take 160 mg by mouth daily with breakfast.     Historical Provider, MD   BP 134/89 mmHg  Pulse 56   Temp(Src) 98.1 F (36.7 C) (Oral)  Resp 18  Ht 5\' 7"  (1.702 m)  Wt 74.844 kg  BMI 25.84 kg/m2  SpO2 97%  LMP 07/24/2011 Physical Exam  Constitutional: She is oriented to person, place, and time. She appears well-developed and well-nourished.  HENT:  Head: Normocephalic and atraumatic.  Eyes: Conjunctivae and EOM are normal. Pupils are equal, round, and reactive to light.  Neck: Normal range of motion. Neck supple.  Cardiovascular: Normal rate and regular rhythm.  Exam reveals no gallop and no friction rub.   No murmur heard. Pulmonary/Chest: Effort normal and breath sounds normal. No respiratory distress. She has no wheezes. She has no rales. She exhibits no tenderness.  Abdominal: Soft. Bowel sounds are normal. She exhibits no distension and no mass. There is no  tenderness. There is no rebound and no guarding.  Genitourinary:  Chaperone present for exam A few small external hemorrhoids, and probable internal hemorrhoids, no gross blood, no obvious fissures, no abscess  Musculoskeletal: Normal range of motion. She exhibits no edema or tenderness.  Neurological: She is alert and oriented to person, place, and time.  Skin: Skin is warm and dry.  Psychiatric: She has a normal mood and affect. Her behavior is normal. Judgment and thought content normal.  Nursing note and vitals reviewed.   ED Course  Procedures (including critical care time) Results for orders placed or performed during the hospital encounter of 11/01/15  Lipase, blood  Result Value Ref Range   Lipase 99 (H) 11 - 51 U/L  Comprehensive metabolic panel  Result Value Ref Range   Sodium 134 (L) 135 - 145 mmol/L   Potassium 4.0 3.5 - 5.1 mmol/L   Chloride 102 101 - 111 mmol/L   CO2 20 (L) 22 - 32 mmol/L   Glucose, Bld 566 (HH) 65 - 99 mg/dL   BUN <5 (L) 6 - 20 mg/dL   Creatinine, Ser 7.82 0.44 - 1.00 mg/dL   Calcium 8.9 8.9 - 95.6 mg/dL   Total Protein 6.1 (L) 6.5 - 8.1 g/dL   Albumin 3.2 (L) 3.5 - 5.0 g/dL   AST  41 15 - 41 U/L   ALT 49 14 - 54 U/L   Alkaline Phosphatase 108 38 - 126 U/L   Total Bilirubin 0.4 0.3 - 1.2 mg/dL   GFR calc non Af Amer >60 >60 mL/min   GFR calc Af Amer >60 >60 mL/min   Anion gap 12 5 - 15  CBC  Result Value Ref Range   WBC 8.6 4.0 - 10.5 K/uL   RBC 4.94 3.87 - 5.11 MIL/uL   Hemoglobin 11.3 (L) 12.0 - 15.0 g/dL   HCT 21.3 08.6 - 57.8 %   MCV 74.9 (L) 78.0 - 100.0 fL   MCH 22.9 (L) 26.0 - 34.0 pg   MCHC 30.5 30.0 - 36.0 g/dL   RDW 46.9 (H) 62.9 - 52.8 %   Platelets 155 150 - 400 K/uL  Urinalysis, Routine w reflex microscopic  Result Value Ref Range   Color, Urine YELLOW YELLOW   APPearance CLEAR CLEAR   Specific Gravity, Urine 1.040 (H) 1.005 - 1.030   pH 5.5 5.0 - 8.0   Glucose, UA >1000 (A) NEGATIVE mg/dL   Hgb urine dipstick NEGATIVE NEGATIVE   Bilirubin Urine NEGATIVE NEGATIVE   Ketones, ur NEGATIVE NEGATIVE mg/dL   Protein, ur NEGATIVE NEGATIVE mg/dL   Nitrite NEGATIVE NEGATIVE   Leukocytes, UA NEGATIVE NEGATIVE  Urine microscopic-add on  Result Value Ref Range   Squamous Epithelial / LPF NONE SEEN NONE SEEN   WBC, UA NONE SEEN 0 - 5 WBC/hpf   RBC / HPF NONE SEEN 0 - 5 RBC/hpf   Bacteria, UA RARE (A) NONE SEEN  POC occult blood, ED  Result Value Ref Range   Fecal Occult Bld POSITIVE (A) NEGATIVE   Dg Chest 2 View  10/25/2015  CLINICAL DATA:  Acute onset of left-sided chest pain, radiating to the upper back. Dry cough and nausea. Initial encounter. EXAM: CHEST  2 VIEW COMPARISON:  Chest radiograph performed 07/30/2015 FINDINGS: The lungs are well-aerated and clear. There is no evidence of focal opacification, pleural effusion or pneumothorax. The heart is normal in size; the mediastinal contour is within normal limits. No acute osseous abnormalities are seen. Clips are noted  within the right upper quadrant, reflecting prior cholecystectomy. IMPRESSION: No acute cardiopulmonary process seen. Electronically Signed   By: Roanna Raider M.D.   On:  10/25/2015 00:50   Ct Abdomen Pelvis W Contrast  11/01/2015  CLINICAL DATA:  Left lower quadrant pain. EXAM: CT ABDOMEN AND PELVIS WITH CONTRAST TECHNIQUE: Multidetector CT imaging of the abdomen and pelvis was performed using the standard protocol following bolus administration of intravenous contrast. CONTRAST:  100 mL ISOVUE-300 IOPAMIDOL (ISOVUE-300) INJECTION 61% COMPARISON:  06/25/2012 FINDINGS: Lower chest:  No significant abnormality Hepatobiliary: There is diffuse fatty infiltration of the liver. No focal liver lesion. Prior cholecystectomy. No bile duct dilatation. Pancreas: Normal Spleen: Normal Adrenals/Urinary Tract: 3 x 5 mm calculus in the midpole right renal collecting system. No parenchymal renal abnormalities. Ureters and urinary bladder are unremarkable. Both adrenals are normal. Stomach/Bowel: There are normal appearances of the stomach, small bowel and colon. The appendix is normal. Vascular/Lymphatic: The abdominal aorta is normal in caliber. There is mild atherosclerotic calcification. There is no adenopathy in the abdomen or pelvis. Reproductive: Normal uterus and ovaries Other: No ascites. No acute inflammatory changes are evident in the abdomen or pelvis. Musculoskeletal: No significant skeletal lesions IMPRESSION: Diffuse fatty infiltration of the liver.  Right nephrolithiasis. Electronically Signed   By: Ellery Plunk M.D.   On: 11/01/2015 21:08    I have personally reviewed and evaluated these images and lab results as part of my medical decision-making.   EKG Interpretation None      MDM   Final diagnoses:  Hematochezia    Patient with GI bleed. She has had bright red blood with some art clots. She does have a few small hemorrhoids. She has associated lightheadedness and dizziness. She has had a 3g drop in her hemoglobin in the past 7 days.  Patient seen by and discussed with Dr. Denton Lank, who recommends admission for GI bleed. Will consult hospitalist.     Mild-moderate LLQ tenderness, but no leukocytosis.  Recent normal colonoscopy 3 years ago.  Will check CT, but highly doubt surgical or acute abdomen.  Also noted that glucose is poorly controlled.  566 today.  Will give some insulin and fluids.  Appreciate Dr. Julian Reil for admitting the patient.      Roxy Horseman, [name] 11/01/15 2112  Cathren Laine, MD 11/02/15 [place]

## 2015-11-01 NOTE — ED Notes (Signed)
Attempted report 

## 2015-11-01 NOTE — H&P (Signed)
History and Physical    [name] WJX:[id number] DOB: [birth year] DOA: 11/01/2015  Referring MD/NP/PA: Roxy Horseman [name] PCP: Lupe Carney, MD Outpatient Specialists: Dr. Madilyn Fireman GI Patient coming from: Home  Chief Complaint: BRBPR  HPI: [name] is a 55 y.o. female with medical history significant of DM2, HTN.  Patient presents to the ED with c/o passing bright red blood and clots with BMP.  Symptoms onset 2 days ago, stabbing and burning sensation in LLQ abdomen.  Never had this before.  Some associated lightheadedness and dizziness.  Last colonoscopy was 3 years ago and was normal she reports.  Does have history of hemorrhoids.  No fevers no chills, no N/V.  ED Course: HGB 11.4 down from 14 some 9 days ago.  Review of Systems: As per HPI otherwise 10 point review of systems negative.    Past Medical History  Diagnosis Date  . Hypertension   . Anxiety   . Gallstones   . GERD (gastroesophageal reflux disease)   . COPD (chronic obstructive pulmonary disease) (HCC)   . Headache(784.0)     migraines  . Rash     arms  . Arthritis   . Depression   . Diabetes mellitus without complication (HCC)     Type II  . Dysrhythmia     pt unsure of name of arrythmia - " my heart rate will drop all of a sudden" -no current treatment - atrial flutter  . Osteomyelitis (HCC)     R ankle  . Fibrillation, atrial [facility])     Past Surgical History  Procedure Laterality Date  . Ectopic pregnancy surgery  many yrs ago  . Carpal tunnel release      bil  . Cholecystectomy  11/24/2011    Procedure: LAPAROSCOPIC CHOLECYSTECTOMY WITH INTRAOPERATIVE CHOLANGIOGRAM;  Surgeon: Clovis Pu. Cornett, MD;  Location: [facility] ORS;  Service: General;  Laterality: N/A;  Laparoscopic Cholecystectomy with Cholangiogram  . Cardiac catheterization N/A 06/24/2015    Procedure: Left Heart Cath and Coronary Angiography;  Surgeon: [name], MD;  Location: [facility] INVASIVE CV LAB;  Service: Cardiovascular;  Laterality:  N/A;  . Ankle fracture surgery Right 2015  . Hardware removal Right 07/15/2015    Procedure: RIGHT ANKLE HARDWARE REMOVAL, PLACEMENT OF STIMULAN ANTIBIOTIC BEADS AND PREVENA WOUND VAC.;  Surgeon: [name], MD;  Location: [facility] OR;  Service: Orthopedics;  Laterality: Right;     reports that she has been smoking Cigarettes.  She has a 10 pack-year smoking history. She has never used smokeless tobacco. She reports that she does not drink alcohol or use illicit drugs.  Allergies  Allergen Reactions  . Biaxin [Clarithromycin] Anaphylaxis and Swelling  . Clarithromycin Shortness Of Breath and Swelling  . Lisinopril Swelling and Cough    Lip edema  . Sulfa Antibiotics Anaphylaxis, Shortness Of Breath, Swelling and Hypertension  . Chlorthalidone Nausea And Vomiting and Other (See Comments)    Clammy, Tachycardia, Headache   . Daptomycin Nausea And Vomiting  . Amoxicillin-Pot Clavulanate Diarrhea    Has patient had a PCN reaction causing immediate rash, facial/tongue/throat swelling, SOB or lightheadedness with hypotension:No Has patient had a PCN reaction causing severe rash involving mucus membranes or skin necrosis:No Has patient had a PCN reaction that required hospitalization:No Has patient had a PCN reaction occurring within the last 10 years:Yes If all of the above answers are "NO", then may proceed with Cephalosporin use.   . Aspirin Nausea And Vomiting and Rash  . Cholestyramine Nausea  And Vomiting  . Dilaudid [Hydromorphone Hcl] Nausea And Vomiting  . Lasix [Furosemide] Nausea And Vomiting and Other (See Comments)    Headache   . Latex Hives and Rash    Family History  Problem Relation Age of Onset  . Breast cancer Sister   . Lung cancer Father      Prior to Admission medications   Medication Sig Start Date End Date Taking? Authorizing Provider  albuterol (PROVENTIL HFA;VENTOLIN HFA) 108 (90 BASE) MCG/ACT inhaler Inhale 2 puffs into the lungs every 4 (four) hours as  needed for wheezing. Wheezing    Historical Provider, MD  ALPRAZolam [name]) 0.5 MG tablet Take 1 mg by mouth 2 (two) times daily. Anxiety    Historical Provider, MD  aspirin EC 81 MG tablet Take 1 tablet (81 mg total) by mouth daily. 07/25/15   [name], MD  bisoprolol (ZEBETA) 5 MG tablet Take 0.5 tablets (2.5 mg total) by mouth daily. 07/25/15   [name], MD  diphenhydrAMINE (BENADRYL) 25 MG tablet Take 25-50 mg by mouth every 6 (six) hours as needed for itching or allergies.     Historical Provider, MD  FLUoxetine (PROZAC) 40 MG capsule Take 40 mg by mouth daily with breakfast.     Historical Provider, MD  glimepiride (AMARYL) 4 MG tablet Take 1 tablet (4 mg total) by mouth daily with breakfast. 10/25/15   [name], MD  hydrOXYzine (ATARAX/VISTARIL) 10 MG tablet Take 20 mg by mouth every 6 (six) hours as needed for itching.    Historical Provider, MD  imipramine (TOFRANIL) 25 MG tablet Take 50 mg by mouth at bedtime. Take 2 hours before bedtime    Historical Provider, MD  loperamide (IMODIUM A-D) 2 MG tablet Take 2 mg by mouth 4 (four) times daily as needed for diarrhea or loose stools.    Historical Provider, MD  miconazole (MONISTAT 7) 2 % vaginal cream Place 1 Applicatorful vaginally at bedtime. 10/26/15   Dorathy Kinsman, CNM  omeprazole (PRILOSEC) 20 MG capsule Take 20 mg by mouth 2 (two) times daily.     Historical Provider, MD  oxyCODONE-acetaminophen (PERCOCET/ROXICET) 5-325 MG tablet Take 1-2 tablets by mouth every 4 (four) hours as needed for severe pain (for pain.).    Historical Provider, MD  pravastatin (PRAVACHOL) 40 MG tablet Take 40 mg by mouth daily.    Historical Provider, MD  sitaGLIPtin (JANUVIA) 100 MG tablet Take 100 mg by mouth every other day.     Historical Provider, MD  tiotropium (SPIRIVA) 18 MCG inhalation capsule Place 18 mcg into inhaler and inhale daily.    Historical Provider, MD  valsartan (DIOVAN) 160 MG tablet Take 160 mg by mouth daily with  breakfast.     Historical Provider, MD    Physical Exam: Filed Vitals:   11/01/15 1945 11/01/15 2000 11/01/15 2015 11/01/15 2110  BP: 152/84 151/86 143/94 146/92  Pulse: 96 88 89 89  Temp:      TempSrc:      Resp:   18 18  Height:      Weight:      SpO2: 98% 98% 96% 98%      Constitutional: NAD, calm, comfortable Filed Vitals:   11/01/15 1945 11/01/15 2000 11/01/15 2015 11/01/15 2110  BP: 152/84 151/86 143/94 146/92  Pulse: 96 88 89 89  Temp:      TempSrc:      Resp:   18 18  Height:      Weight:  SpO2: 98% 98% 96% 98%   Eyes: PERRL, lids and conjunctivae normal ENMT: Mucous membranes are moist. Posterior pharynx clear of any exudate or lesions.Normal dentition.  Neck: normal, supple, no masses, no thyromegaly Respiratory: clear to auscultation bilaterally, no wheezing, no crackles. Normal respiratory effort. No accessory muscle use.  Cardiovascular: Regular rate and rhythm, no murmurs / rubs / gallops. No extremity edema. 2+ pedal pulses. No carotid bruits.  Abdomen: no tenderness, no masses palpated. No hepatosplenomegaly. Bowel sounds positive.  Musculoskeletal: no clubbing / cyanosis. No joint deformity upper and lower extremities. Good ROM, no contractures. Normal muscle tone.  Skin: no rashes, lesions, ulcers. No induration Neurologic: CN 2-12 grossly intact. Sensation intact, DTR normal. Strength 5/5 in all 4.  Psychiatric: Normal judgment and insight. Alert and oriented x 3. Normal mood.    Labs on Admission: I have personally reviewed following labs and imaging studies  CBC:  Recent Labs Lab 11/01/15 1927  WBC 8.6  HGB 11.3*  HCT 37.0  MCV 74.9*  PLT 155   Basic Metabolic Panel:  Recent Labs Lab 11/01/15 1927  NA 134*  K 4.0  CL 102  CO2 20*  GLUCOSE 566*  BUN <5*  CREATININE 0.75  CALCIUM 8.9   GFR: Estimated Creatinine Clearance: 84.9 mL/min (by C-G formula based on Cr of 0.75). Liver Function Tests:  Recent Labs Lab  11/01/15 1927  AST 41  ALT 49  ALKPHOS 108  BILITOT 0.4  PROT 6.1*  ALBUMIN 3.2*    Recent Labs Lab 11/01/15 1927  LIPASE 99*   No results for input(s): AMMONIA in the last 168 hours. Coagulation Profile: No results for input(s): INR, PROTIME in the last 168 hours. Cardiac Enzymes: No results for input(s): CKTOTAL, CKMB, CKMBINDEX, TROPONINI in the last 168 hours. BNP (last 3 results) No results for input(s): PROBNP in the last 8760 hours. HbA1C: No results for input(s): HGBA1C in the last 72 hours. CBG:  Recent Labs Lab 10/26/15 0121  GLUCAP 383*   Lipid Profile: No results for input(s): CHOL, HDL, LDLCALC, TRIG, CHOLHDL, LDLDIRECT in the last 72 hours. Thyroid Function Tests: No results for input(s): TSH, T4TOTAL, FREET4, T3FREE, THYROIDAB in the last 72 hours. Anemia Panel: No results for input(s): VITAMINB12, FOLATE, FERRITIN, TIBC, IRON, RETICCTPCT in the last 72 hours. Urine analysis:    Component Value Date/Time   COLORURINE YELLOW 11/01/2015 1934   APPEARANCEUR CLEAR 11/01/2015 1934   LABSPEC 1.040* 11/01/2015 1934   PHURINE 5.5 11/01/2015 1934   GLUCOSEU >1000* 11/01/2015 1934   HGBUR NEGATIVE 11/01/2015 1934   BILIRUBINUR NEGATIVE 11/01/2015 1934   KETONESUR NEGATIVE 11/01/2015 1934   PROTEINUR NEGATIVE 11/01/2015 1934   UROBILINOGEN 0.2 04/25/2013 2329   NITRITE NEGATIVE 11/01/2015 1934   LEUKOCYTESUR NEGATIVE 11/01/2015 1934   Sepsis Labs: @LABRCNTIP (procalcitonin:4,lacticidven:4) ) Recent Results (from the past 240 hour(s))  Wet prep, genital     Status: None   Collection Time: 10/26/15  1:08 AM  Result Value Ref Range Status   Yeast Wet Prep HPF POC NONE SEEN NONE SEEN Final   Trich, Wet Prep NONE SEEN NONE SEEN Final   Clue Cells Wet Prep HPF POC NONE SEEN NONE SEEN Final   WBC, Wet Prep HPF POC NONE SEEN NONE SEEN Final    Comment: FEW BACTERIA SEEN   Sperm NONE SEEN  Final     Radiological Exams on Admission: Ct Abdomen Pelvis W  Contrast  11/01/2015  CLINICAL DATA:  Left lower quadrant pain. EXAM: CT ABDOMEN  AND PELVIS WITH CONTRAST TECHNIQUE: Multidetector CT imaging of the abdomen and pelvis was performed using the standard protocol following bolus administration of intravenous contrast. CONTRAST:  100 mL ISOVUE-300 IOPAMIDOL (ISOVUE-300) INJECTION 61% COMPARISON:  06/25/2012 FINDINGS: Lower chest:  No significant abnormality Hepatobiliary: There is diffuse fatty infiltration of the liver. No focal liver lesion. Prior cholecystectomy. No bile duct dilatation. Pancreas: Normal Spleen: Normal Adrenals/Urinary Tract: 3 x 5 mm calculus in the midpole right renal collecting system. No parenchymal renal abnormalities. Ureters and urinary bladder are unremarkable. Both adrenals are normal. Stomach/Bowel: There are normal appearances of the stomach, small bowel and colon. The appendix is normal. Vascular/Lymphatic: The abdominal aorta is normal in caliber. There is mild atherosclerotic calcification. There is no adenopathy in the abdomen or pelvis. Reproductive: Normal uterus and ovaries Other: No ascites. No acute inflammatory changes are evident in the abdomen or pelvis. Musculoskeletal: No significant skeletal lesions IMPRESSION: Diffuse fatty infiltration of the liver.  Right nephrolithiasis. Electronically Signed   By: Ellery Plunk M.D.   On: 11/01/2015 21:08    EKG: Independently reviewed.  Assessment/Plan Principal Problem:   Hematochezia Active Problems:   Hypertension   Diabetes mellitus without complication (HCC)    Hematochezia -  Admit for obs  Repeat H/H in AM  Call GI in AM for follow up if bleeding resolves vs colonoscopy if bleeding continues  Clear liquid diet for now  Tele monitor  HTN - continue home meds  DM2 - SSI Q4H moderate dose, getting 10 units now in ED for BGL 566.  If no significant improvement then may need insulin gtt.   DVT prophylaxis: SCDs only Code Status: Full Family  Communication: Husband at bedside Consults called: None Admission status: Admit to obs   Jayley Hustead, Heywood Iles DO [facility] Pager [phone] from 7PM-7AM  If 7AM-7PM, please contact the day physician for the patient [email or web address] Password [email or web address]  11/01/2015, 9:13 PM

## 2015-11-01 NOTE — ED Notes (Signed)
Pt in CT.

## 2015-11-01 NOTE — ED Notes (Signed)
Patient presents stating last night she had a BM and noticed it was bright red blood with a couple of clots.  Then again today had another bloody stool that was bright red.  Also c/o a pulling feeling in the left shoulder blade

## 2015-11-02 ENCOUNTER — Encounter (HOSPITAL_COMMUNITY): Payer: Self-pay | Admitting: *Deleted

## 2015-11-02 DIAGNOSIS — E119 Type 2 diabetes mellitus without complications: Secondary | ICD-10-CM | POA: Diagnosis not present

## 2015-11-02 DIAGNOSIS — K921 Melena: Secondary | ICD-10-CM | POA: Diagnosis not present

## 2015-11-02 LAB — CBC
HCT: 36.4 % (ref 36.0–46.0)
HEMATOCRIT: 34.7 % — AB (ref 36.0–46.0)
Hemoglobin: 10.6 g/dL — ABNORMAL LOW (ref 12.0–15.0)
Hemoglobin: 10.9 g/dL — ABNORMAL LOW (ref 12.0–15.0)
MCH: 22.9 pg — ABNORMAL LOW (ref 26.0–34.0)
MCH: 22.4 pg — ABNORMAL LOW (ref 26.0–34.0)
MCHC: 30.5 g/dL (ref 30.0–36.0)
MCHC: 29.9 g/dL — AB (ref 30.0–36.0)
MCV: 75.1 fL — AB (ref 78.0–100.0)
MCV: 74.7 fL — AB (ref 78.0–100.0)
PLATELETS: 147 10*3/uL — AB (ref 150–400)
PLATELETS: 153 10*3/uL (ref 150–400)
RBC: 4.62 MIL/uL (ref 3.87–5.11)
RBC: 4.87 MIL/uL (ref 3.87–5.11)
RDW: 19.3 % — AB (ref 11.5–15.5)
RDW: 19.1 % — AB (ref 11.5–15.5)
WBC: 8 10*3/uL (ref 4.0–10.5)
WBC: 8.5 10*3/uL (ref 4.0–10.5)

## 2015-11-02 LAB — BASIC METABOLIC PANEL
ANION GAP: 7 (ref 5–15)
Anion gap: 9 (ref 5–15)
BUN: 6 mg/dL (ref 6–20)
CALCIUM: 8.1 mg/dL — AB (ref 8.9–10.3)
CO2: 24 mmol/L (ref 22–32)
CO2: 23 mmol/L (ref 22–32)
CREATININE: 0.53 mg/dL (ref 0.44–1.00)
CREATININE: 0.63 mg/dL (ref 0.44–1.00)
Calcium: 8.5 mg/dL — ABNORMAL LOW (ref 8.9–10.3)
Chloride: 107 mmol/L (ref 101–111)
Chloride: 107 mmol/L (ref 101–111)
GFR calc Af Amer: >60 mL/min (ref >60)
GLUCOSE: 227 mg/dL — AB (ref 65–99)
Glucose, Bld: 440 mg/dL — ABNORMAL HIGH (ref 65–99)
POTASSIUM: 3.3 mmol/L — AB (ref 3.5–5.1)
Potassium: 3.8 mmol/L (ref 3.5–5.1)
Sodium: 140 mmol/L (ref 135–145)
Sodium: 137 mmol/L (ref 135–145)

## 2015-11-02 LAB — IRON AND TIBC
IRON: 23 ug/dL — AB (ref 28–170)
Saturation Ratios: 6 % — ABNORMAL LOW (ref 10.4–31.8)
TIBC: 384 ug/dL (ref 250–450)
UIBC: 361 ug/dL

## 2015-11-02 LAB — TYPE AND SCREEN
ABO/RH(D): A POS
Antibody Screen: NEGATIVE

## 2015-11-02 LAB — GLUCOSE, CAPILLARY
GLUCOSE-CAPILLARY: 270 mg/dL — AB (ref 65–99)
GLUCOSE-CAPILLARY: 287 mg/dL — AB (ref 65–99)
GLUCOSE-CAPILLARY: 423 mg/dL — AB (ref 65–99)
Glucose-Capillary: 179 mg/dL — ABNORMAL HIGH (ref 65–99)
Glucose-Capillary: 259 mg/dL — ABNORMAL HIGH (ref 65–99)
Glucose-Capillary: 311 mg/dL — ABNORMAL HIGH (ref 65–99)

## 2015-11-02 LAB — FERRITIN: Ferritin: 12 ng/mL (ref 11–307)

## 2015-11-02 LAB — ABO/RH: ABO/RH(D): A POS

## 2015-11-02 MED ORDER — SODIUM CHLORIDE 0.9 % IV SOLN
INTRAVENOUS | Status: AC
  Administered 2015-11-02: 11:00:00 via INTRAVENOUS

## 2015-11-02 MED ORDER — POTASSIUM CHLORIDE 10 MEQ/100ML IV SOLN
10.0000 meq | INTRAVENOUS | Status: AC
  Administered 2015-11-02 (×2): 10 meq via INTRAVENOUS
  Filled 2015-11-02 (×2): qty 100

## 2015-11-02 MED ORDER — INSULIN ASPART 100 UNIT/ML ~~LOC~~ SOLN
12.0000 [IU] | Freq: Once | SUBCUTANEOUS | Status: AC
  Administered 2015-11-02: 12 [IU] via SUBCUTANEOUS

## 2015-11-02 MED ORDER — FLUOXETINE HCL 20 MG PO CAPS
40.0000 mg | ORAL_CAPSULE | Freq: Every day | ORAL | Status: DC
  Administered 2015-11-02 (×2): 40 mg via ORAL
  Filled 2015-11-02 (×2): qty 2

## 2015-11-02 NOTE — Progress Notes (Signed)
Patient ID: [name], female   DOB: 04-06-1961, 55 y.o.   MRN: [id number]                                                                PROGRESS NOTE                                                                                                                                                                                                             Patient Demographics:    [name], is a 55 y.o. female, DOB - 08/24/1960, [id number]  Admit date - 11/01/2015   Admitting Physician Hillary Bow, DO  Outpatient Primary MD for the patient is Lupe Carney, MD  LOS - 1d  Outpatient Specialists:  GI Dr. Madilyn Fireman   Chief Complaint  Patient presents with  . Rectal Bleeding  . Abdominal Pain       Brief Narrative  55 yo female with Afib c/o brbpr, x3 days.  Pt states that yesterday had muliple episodes and therefore presented to ED for evaluation.  Hgb noted to be lower than baseline. Last colonoscopy 2014. Pt did admit to aspirin and nsaid use (ibuprofen) recently.     Subjective:    Zetha Hsiao feeling better this am. No further episodes of brbpr.  Denies abd pain, heartburn, diarrhea, black stool.    Assessment  & Plan :    Principal Problem:   Hematochezia Active Problems:   Hypertension   Diabetes mellitus without complication (HCC)   1.  Brbpr NPO except medication, iv hydration.  GI consult requested from Dr. Madilyn Fireman,  We appreciate his input.  Repeat cbc, type and screen pending and repeat cbc in am tomorrow   2.  Gerd Cont protonix  3.  Hypokalemia Replete Check cmp in am  4. Afib Stop aspirin for now.   5. Dm2 fsbs q4h, iss.       Code Status :  FULL CODE  Disposition Plan  : HOME AFTER W/UP Consults  :  GI  Procedures  : NONE  DVT Prophylaxis  :  SCD  Lab Results  Component Value Date   PLT 153 11/02/2015    Antibiotics  : NONE  Anti-infectives    None        Objective:   Filed Vitals:  11/01/15 2130 11/02/15 0107 11/02/15  0336 11/02/15 0815  BP: 132/89 145/82 125/80   Pulse: 85 86 91   Temp:  98.4 F (36.9 C) 98.7 F (37.1 C)   TempSrc:  Oral Oral   Resp:  18 18   Height:      Weight:   76.703 kg (169 lb 1.6 oz)   SpO2: 97% 97% 98% 98%    Wt Readings from Last 3 Encounters:  11/02/15 76.703 kg (169 lb 1.6 oz)  10/25/15 75.297 kg (166 lb)  10/24/15 73.936 kg (163 lb)     Intake/Output Summary (Last 24 hours) at 11/02/15 0925 Last data filed at 11/02/15 0841  Gross per 24 hour  Intake    360 ml  Output      0 ml  Net    360 ml     Physical Exam  Awake Alert, Oriented X 3, No new F.N deficits, Normal affect HEENT: anicteric, pink conjunctiva   Supple Neck,No JVD, No cervical lymphadenopathy appriciated.  Symmetrical Chest wall movement, Good air movement bilaterally, CTAB RRR,No Gallops,Rubs or new Murmurs, No Parasternal Heave +ve B.Sounds, Abd Soft, No tenderness, No organomegaly appriciated, No rebound - guarding or rigidity. No Cyanosis, Clubbing or edema, No new Rash or bruise     Data Review:    CBC  Recent Labs Lab 11/01/15 1927 11/02/15 0249 11/02/15 0813  WBC 8.6 8.0 8.5  HGB 11.3* 10.6* 10.9*  HCT 37.0 34.7* 36.4  PLT 155 147* 153  MCV 74.9* 75.1* 74.7*  MCH 22.9* 22.9* 22.4*  MCHC 30.5 30.5 29.9*  RDW 19.2* 19.3* 19.1*    Chemistries   Recent Labs Lab 11/01/15 1927 11/02/15 0249  NA 134* 140  K 4.0 3.3*  CL 102 107  CO2 20* 24  GLUCOSE 566* 227*  BUN <5* <5*  CREATININE 0.75 0.53  CALCIUM 8.9 8.5*  AST 41  --   ALT 49  --   ALKPHOS 108  --   BILITOT 0.4  --    ------------------------------------------------------------------------------------------------------------------ No results for input(s): CHOL, HDL, LDLCALC, TRIG, CHOLHDL, LDLDIRECT in the last 72 hours.  No results found for: HGBA1C ------------------------------------------------------------------------------------------------------------------ No results for input(s): TSH, T4TOTAL,  T3FREE, THYROIDAB in the last 72 hours.  Invalid input(s): FREET3 ------------------------------------------------------------------------------------------------------------------ No results for input(s): VITAMINB12, FOLATE, FERRITIN, TIBC, IRON, RETICCTPCT in the last 72 hours.  Coagulation profile No results for input(s): INR, PROTIME in the last 168 hours.  No results for input(s): DDIMER in the last 72 hours.  Cardiac Enzymes No results for input(s): CKMB, TROPONINI, MYOGLOBIN in the last 168 hours.  Invalid input(s): CK ------------------------------------------------------------------------------------------------------------------    Component Value Date/Time   BNP 115.1* 10/18/2015 2226    Inpatient Medications  Scheduled Meds: . ALPRAZolam  1 mg Oral BID  . bisoprolol  2.5 mg Oral Daily  . FLUoxetine  40 mg Oral QHS  . insulin aspart  0-9 Units Subcutaneous Q4H  . irbesartan  150 mg Oral Daily  . pantoprazole  40 mg Oral Daily  . potassium chloride  10 mEq Intravenous Q1 Hr x 2  . pravastatin  40 mg Oral Daily  . tiotropium  18 mcg Inhalation Daily   Continuous Infusions:  PRN Meds:.albuterol, diphenhydrAMINE, hydrOXYzine, oxyCODONE-acetaminophen  Micro Results Recent Results (from the past 240 hour(s))  Wet prep, genital     Status: None   Collection Time: 10/26/15  1:08 AM  Result Value Ref Range Status   Yeast Wet Prep HPF POC NONE SEEN NONE  SEEN Final   Trich, Wet Prep NONE SEEN NONE SEEN Final   Clue Cells Wet Prep HPF POC NONE SEEN NONE SEEN Final   WBC, Wet Prep HPF POC NONE SEEN NONE SEEN Final    Comment: FEW BACTERIA SEEN   Sperm NONE SEEN  Final    Radiology Reports Dg Chest 2 View  10/25/2015  CLINICAL DATA:  Acute onset of left-sided chest pain, radiating to the upper back. Dry cough and nausea. Initial encounter. EXAM: CHEST  2 VIEW COMPARISON:  Chest radiograph performed 07/30/2015 FINDINGS: The lungs are well-aerated and clear. There is no  evidence of focal opacification, pleural effusion or pneumothorax. The heart is normal in size; the mediastinal contour is within normal limits. No acute osseous abnormalities are seen. Clips are noted within the right upper quadrant, reflecting prior cholecystectomy. IMPRESSION: No acute cardiopulmonary process seen. Electronically Signed   By: Roanna Raider M.D.   On: 10/25/2015 00:50   Ct Abdomen Pelvis W Contrast  11/01/2015  CLINICAL DATA:  Left lower quadrant pain. EXAM: CT ABDOMEN AND PELVIS WITH CONTRAST TECHNIQUE: Multidetector CT imaging of the abdomen and pelvis was performed using the standard protocol following bolus administration of intravenous contrast. CONTRAST:  100 mL ISOVUE-300 IOPAMIDOL (ISOVUE-300) INJECTION 61% COMPARISON:  06/25/2012 FINDINGS: Lower chest:  No significant abnormality Hepatobiliary: There is diffuse fatty infiltration of the liver. No focal liver lesion. Prior cholecystectomy. No bile duct dilatation. Pancreas: Normal Spleen: Normal Adrenals/Urinary Tract: 3 x 5 mm calculus in the midpole right renal collecting system. No parenchymal renal abnormalities. Ureters and urinary bladder are unremarkable. Both adrenals are normal. Stomach/Bowel: There are normal appearances of the stomach, small bowel and colon. The appendix is normal. Vascular/Lymphatic: The abdominal aorta is normal in caliber. There is mild atherosclerotic calcification. There is no adenopathy in the abdomen or pelvis. Reproductive: Normal uterus and ovaries Other: No ascites. No acute inflammatory changes are evident in the abdomen or pelvis. Musculoskeletal: No significant skeletal lesions IMPRESSION: Diffuse fatty infiltration of the liver.  Right nephrolithiasis. Electronically Signed   By: Ellery Plunk M.D.   On: 11/01/2015 21:08    Time Spent in minutes  30   Pearson Grippe M.D on 11/02/2015 at 9:25 AM  Between 7am to 7pm - Pager - [phone] After 7pm go to [email or web address] - password  [facility]  [facility] -  Office  [phone]

## 2015-11-02 NOTE — Consult Note (Addendum)
[facility] Consult Note  Referring Provider: No ref. provider found Primary Care Physician:  Donnie Coffin, MD Primary Gastroenterologist:  Dr.  Chief Complaint: Rectal bleeding HPI: [name] is an 55 y.o. white female  who had a colonoscopy in May 2014 presents with 2 or 3 episodes of bright red blood mixed in stool as well as some perianal pain and some antecedent lower abdominal discomfort for about the last week. Her hemoglobin was 10.6 compared to 12.92 weeks ago and this morning it was 10.9. Each time she saw blood was mixed with a normal-appearing bowel movement. Her colonoscopy 2014 showed a polyp but no diverticula. She does have symptomatically hemorrhoids which been more symptomatically last 2 or 3 days.  Past Medical History  Diagnosis Date  . Hypertension   . Anxiety   . Gallstones   . GERD (gastroesophageal reflux disease)   . COPD (chronic obstructive pulmonary disease) ([place])   . Headache(784.0)     migraines  . Rash     arms  . Arthritis   . Depression   . Diabetes mellitus without complication ([place])     Type II  . Dysrhythmia     pt unsure of name of arrythmia - " my heart rate will drop all of a sudden" -no current treatment - atrial flutter  . Osteomyelitis (HCC)     R ankle  . Fibrillation, atrial [facility])     Past Surgical History  Procedure Laterality Date  . Ectopic pregnancy surgery  many yrs ago  . Carpal tunnel release      bil  . Cholecystectomy  11/24/2011    Procedure: LAPAROSCOPIC CHOLECYSTECTOMY WITH INTRAOPERATIVE CHOLANGIOGRAM;  Surgeon: Joyice Faster. Cornett, MD;  Location: [facility];  Service: General;  Laterality: N/A;  Laparoscopic Cholecystectomy with Cholangiogram  . Cardiac catheterization N/A 06/24/2015    Procedure: Left Heart Cath and Coronary Angiography;  Surgeon: Belva Crome, MD;  Location: [facility];  Service: Cardiovascular;  Laterality: N/A;  . Ankle fracture surgery Right 2015  . Hardware removal Right  07/15/2015    Procedure: RIGHT ANKLE HARDWARE REMOVAL, PLACEMENT OF STIMULAN ANTIBIOTIC BEADS AND PREVENA WOUND VAC.;  Surgeon: Meredith Pel, MD;  Location: [facility];  Service: Orthopedics;  Laterality: Right;    Medications Prior to Admission  Medication Sig Dispense Refill  . albuterol (PROVENTIL HFA;VENTOLIN HFA) 108 (90 BASE) MCG/ACT inhaler Inhale 2 puffs into the lungs every 4 (four) hours as needed for wheezing. Wheezing    . ALPRAZolam (XANAX) 0.5 MG tablet Take 1 mg by mouth 2 (two) times daily. Anxiety    . aspirin EC 81 MG tablet Take 1 tablet (81 mg total) by mouth daily.    . bisoprolol (ZEBETA) 5 MG tablet Take 0.5 tablets (2.5 mg total) by mouth daily. 45 tablet 3  . diphenhydrAMINE (BENADRYL) 25 MG tablet Take 25-50 mg by mouth every 4 (four) hours as needed for itching or allergies.     Marland Kitchen FLUoxetine (PROZAC) 40 MG capsule Take 40 mg by mouth at bedtime.     Marland Kitchen glimepiride (AMARYL) 4 MG tablet Take 1 tablet (4 mg total) by mouth daily with breakfast. 7 tablet 0  . hydrOXYzine (ATARAX/VISTARIL) 10 MG tablet Take 10 mg by mouth every 6 (six) hours as needed for itching.     . loperamide (IMODIUM A-D) 2 MG tablet Take 2 mg by mouth 4 (four) times daily as needed for diarrhea or loose stools.    Marland Kitchen omeprazole (  PRILOSEC) 20 MG capsule Take 20 mg by mouth 2 (two) times daily.     Marland Kitchen oxyCODONE-acetaminophen (PERCOCET/ROXICET) 5-325 MG tablet Take 1-2 tablets by mouth every 8 (eight) hours as needed for severe pain (for pain.).     Marland Kitchen pravastatin (PRAVACHOL) 40 MG tablet Take 40 mg by mouth daily.    . sitaGLIPtin (JANUVIA) 100 MG tablet Take 100 mg by mouth daily.     Marland Kitchen tiotropium (SPIRIVA) 18 MCG inhalation capsule Place 18 mcg into inhaler and inhale daily.    . valsartan (DIOVAN) 160 MG tablet Take 160 mg by mouth daily with breakfast.     . imipramine (TOFRANIL) 25 MG tablet Take 50 mg by mouth at bedtime. Take 2 hours before bedtime    . miconazole (MONISTAT 7) 2 % vaginal cream  Place 1 Applicatorful vaginally at bedtime. 45 g 0    Allergies:  Allergies  Allergen Reactions  . Biaxin [Clarithromycin] Anaphylaxis and Swelling  . Clarithromycin Shortness Of Breath and Swelling  . Lisinopril Swelling and Cough    Lip edema  . Sulfa Antibiotics Anaphylaxis, Shortness Of Breath, Swelling and Hypertension  . Chlorthalidone Nausea And Vomiting and Other (See Comments)    Clammy, Tachycardia, Headache   . Daptomycin Nausea And Vomiting  . Amoxicillin-Pot Clavulanate Diarrhea    Has patient had a PCN reaction causing immediate rash, facial/tongue/throat swelling, SOB or lightheadedness with hypotension:No Has patient had a PCN reaction causing severe rash involving mucus membranes or skin necrosis:No Has patient had a PCN reaction that required hospitalization:No Has patient had a PCN reaction occurring within the last 10 years:Yes If all of the above answers are "NO", then may proceed with Cephalosporin use.   . Aspirin Nausea And Vomiting and Rash  . Cholestyramine Nausea And Vomiting  . Dilaudid [Hydromorphone Hcl] Nausea And Vomiting  . Lasix [Furosemide] Nausea And Vomiting and Other (See Comments)    Headache   . Latex Hives and Rash    Family History  Problem Relation Age of Onset  . Breast cancer Sister   . Lung cancer Father     Social History:  reports that she has been smoking Cigarettes.  She has a 10 pack-year smoking history. She has never used smokeless tobacco. She reports that she does not drink alcohol or use illicit drugs.  Review of Systems: negative except As above   Blood pressure 125/80, pulse 91, temperature 98.7 F (37.1 C), temperature source Oral, resp. rate 18, height _0  (1.702 m), weight 76.703 kg (169 lb 1.6 oz), last menstrual period 07/24/2011, SpO2 98 %. Head: Normocephalic, without obvious abnormality, atraumatic Neck: no adenopathy, no carotid bruit, no JVD, supple, symmetrical, trachea midline and thyroid not enlarged,  symmetric, no tenderness/mass/nodules Resp: clear to auscultation bilaterally Cardio: regular rate and rhythm, S1, S2 normal, no murmur, click, rub or gallop GI: Abdomen soft nondistended with normoactive bowel sounds. No pets or megaly masses or guarding Extremities: extremities normal, atraumatic, no cyanosis or edema Rectal exam reveals some external and small internal hemorrhoids. No blood on the examining glove.  Results for orders placed or performed during the hospital encounter of 11/01/15 (from the past 48 hour(s))  Lipase, blood     Status: Abnormal   Collection Time: 11/01/15  7:27 PM  Result Value Ref Range   Lipase 99 (H) 11 - 51 U/L  Comprehensive metabolic panel     Status: Abnormal   Collection Time: 11/01/15  7:27 PM  Result Value Ref Range  Sodium 134 (L) 135 - 145 mmol/L   Potassium 4.0 3.5 - 5.1 mmol/L   Chloride 102 101 - 111 mmol/L   CO2 20 (L) 22 - 32 mmol/L   Glucose, Bld 566 (HH) 65 - 99 mg/dL    Comment: CRITICAL RESULT CALLED TO, READ BACK BY AND VERIFIED WITH: OLDLAND,B RN 11/01/15 2008 WOOTEN,K    BUN <5 (L) 6 - 20 mg/dL   Creatinine, Ser 0.75 0.44 - 1.00 mg/dL   Calcium 8.9 8.9 - 10.3 mg/dL   Total Protein 6.1 (L) 6.5 - 8.1 g/dL   Albumin 3.2 (L) 3.5 - 5.0 g/dL   AST 41 15 - 41 U/L   ALT 49 14 - 54 U/L   Alkaline Phosphatase 108 38 - 126 U/L   Total Bilirubin 0.4 0.3 - 1.2 mg/dL   GFR calc non Af Amer >60 >60 mL/min   GFR calc Af Amer >60 >60 mL/min    Comment: (NOTE) The eGFR has been calculated using the CKD EPI equation. This calculation has not been validated in all clinical situations. eGFR's persistently <60 mL/min signify possible Chronic Kidney Disease.    Anion gap 12 5 - 15  CBC     Status: Abnormal   Collection Time: 11/01/15  7:27 PM  Result Value Ref Range   WBC 8.6 4.0 - 10.5 K/uL   RBC 4.94 3.87 - 5.11 MIL/uL   Hemoglobin 11.3 (L) 12.0 - 15.0 g/dL   HCT 37.0 36.0 - 46.0 %   MCV 74.9 (L) 78.0 - 100.0 fL   MCH 22.9 (L) 26.0 -  34.0 pg   MCHC 30.5 30.0 - 36.0 g/dL   RDW 19.2 (H) 11.5 - 15.5 %   Platelets 155 150 - 400 K/uL  Urinalysis, Routine w reflex microscopic     Status: Abnormal   Collection Time: 11/01/15  7:34 PM  Result Value Ref Range   Color, Urine YELLOW YELLOW   APPearance CLEAR CLEAR   Specific Gravity, Urine 1.040 (H) 1.005 - 1.030   pH 5.5 5.0 - 8.0   Glucose, UA >1000 (A) NEGATIVE mg/dL   Hgb urine dipstick NEGATIVE NEGATIVE   Bilirubin Urine NEGATIVE NEGATIVE   Ketones, ur NEGATIVE NEGATIVE mg/dL   Protein, ur NEGATIVE NEGATIVE mg/dL   Nitrite NEGATIVE NEGATIVE   Leukocytes, UA NEGATIVE NEGATIVE  Urine microscopic-add on     Status: Abnormal   Collection Time: 11/01/15  7:34 PM  Result Value Ref Range   Squamous Epithelial / LPF NONE SEEN NONE SEEN   WBC, UA NONE SEEN 0 - 5 WBC/hpf   RBC / HPF NONE SEEN 0 - 5 RBC/hpf   Bacteria, UA RARE (A) NONE SEEN  POC occult blood, ED     Status: Abnormal   Collection Time: 11/01/15  7:49 PM  Result Value Ref Range   Fecal Occult Bld POSITIVE (A) NEGATIVE  Glucose, capillary     Status: Abnormal   Collection Time: 11/02/15 12:06 AM  Result Value Ref Range   Glucose-Capillary 179 (H) 65 - 99 mg/dL  Basic metabolic panel     Status: Abnormal   Collection Time: 11/02/15  2:49 AM  Result Value Ref Range   Sodium 140 135 - 145 mmol/L   Potassium 3.3 (L) 3.5 - 5.1 mmol/L    Comment: DELTA CHECK NOTED   Chloride 107 101 - 111 mmol/L   CO2 24 22 - 32 mmol/L   Glucose, Bld 227 (H) 65 - 99 mg/dL   BUN <  5 (L) 6 - 20 mg/dL   Creatinine, Ser 0.53 0.44 - 1.00 mg/dL   Calcium 8.5 (L) 8.9 - 10.3 mg/dL   GFR calc non Af Amer >60 >60 mL/min   GFR calc Af Amer >60 >60 mL/min    Comment: (NOTE) The eGFR has been calculated using the CKD EPI equation. This calculation has not been validated in all clinical situations. eGFR's persistently <60 mL/min signify possible Chronic Kidney Disease.    Anion gap 9 5 - 15  CBC     Status: Abnormal   Collection  Time: 11/02/15  2:49 AM  Result Value Ref Range   WBC 8.0 4.0 - 10.5 K/uL   RBC 4.62 3.87 - 5.11 MIL/uL   Hemoglobin 10.6 (L) 12.0 - 15.0 g/dL   HCT 34.7 (L) 36.0 - 46.0 %   MCV 75.1 (L) 78.0 - 100.0 fL   MCH 22.9 (L) 26.0 - 34.0 pg   MCHC 30.5 30.0 - 36.0 g/dL   RDW 19.3 (H) 11.5 - 15.5 %   Platelets 147 (L) 150 - 400 K/uL  Glucose, capillary     Status: Abnormal   Collection Time: 11/02/15  3:33 AM  Result Value Ref Range   Glucose-Capillary 270 (H) 65 - 99 mg/dL  Glucose, capillary     Status: Abnormal   Collection Time: 11/02/15  8:03 AM  Result Value Ref Range   Glucose-Capillary 259 (H) 65 - 99 mg/dL  CBC     Status: Abnormal   Collection Time: 11/02/15  8:13 AM  Result Value Ref Range   WBC 8.5 4.0 - 10.5 K/uL   RBC 4.87 3.87 - 5.11 MIL/uL   Hemoglobin 10.9 (L) 12.0 - 15.0 g/dL   HCT 36.4 36.0 - 46.0 %   MCV 74.7 (L) 78.0 - 100.0 fL   MCH 22.4 (L) 26.0 - 34.0 pg   MCHC 29.9 (L) 30.0 - 36.0 g/dL   RDW 19.1 (H) 11.5 - 15.5 %   Platelets 153 150 - 400 K/uL  Ferritin     Status: None   Collection Time: 11/02/15  8:13 AM  Result Value Ref Range   Ferritin 12 11 - 307 ng/mL  Iron and TIBC     Status: Abnormal   Collection Time: 11/02/15  8:13 AM  Result Value Ref Range   Iron 23 (L) 28 - 170 ug/dL   TIBC 384 250 - 450 ug/dL   Saturation Ratios 6 (L) 10.4 - 31.8 %   UIBC 361 ug/dL  Type and screen [place]     Status: None   Collection Time: 11/02/15  8:21 AM  Result Value Ref Range   ABO/RH(D) A POS    Antibody Screen NEG    Sample Expiration 11/05/2015    Ct Abdomen Pelvis W Contrast  11/01/2015  CLINICAL DATA:  Left lower quadrant pain. EXAM: CT ABDOMEN AND PELVIS WITH CONTRAST TECHNIQUE: Multidetector CT imaging of the abdomen and pelvis was performed using the standard protocol following bolus administration of intravenous contrast. CONTRAST:  100 mL ISOVUE-300 IOPAMIDOL (ISOVUE-300) INJECTION 61% COMPARISON:  06/25/2012 FINDINGS: Lower chest:   No significant abnormality Hepatobiliary: There is diffuse fatty infiltration of the liver. No focal liver lesion. Prior cholecystectomy. No bile duct dilatation. Pancreas: Normal Spleen: Normal Adrenals/Urinary Tract: 3 x 5 mm calculus in the midpole right renal collecting system. No parenchymal renal abnormalities. Ureters and urinary bladder are unremarkable. Both adrenals are normal. Stomach/Bowel: There are normal appearances of the stomach, small  bowel and colon. The appendix is normal. Vascular/Lymphatic: The abdominal aorta is normal in caliber. There is mild atherosclerotic calcification. There is no adenopathy in the abdomen or pelvis. Reproductive: Normal uterus and ovaries Other: No ascites. No acute inflammatory changes are evident in the abdomen or pelvis. Musculoskeletal: No significant skeletal lesions IMPRESSION: Diffuse fatty infiltration of the liver.  Right nephrolithiasis. Electronically Signed   By: Andreas Newport M.D.   On: 11/01/2015 21:08    Assessment: Today history of rectal bleeding suspect perianal overall given previous recent colonoscopy reports and perianal symptoms Plan:  We'll allow regular diet, patient given the option of discharge today with follow-up with me, , versus staying 1 more night of observation. We'll see if she has a stool later today to help aid in this sedation. Debe Anfinson C 11/02/2015, 10:14 AM  Pager [phone] If no answer or after 5 PM call [phone]

## 2015-11-03 ENCOUNTER — Encounter (HOSPITAL_COMMUNITY): Payer: Self-pay | Admitting: Internal Medicine

## 2015-11-03 DIAGNOSIS — F419 Anxiety disorder, unspecified: Secondary | ICD-10-CM

## 2015-11-03 DIAGNOSIS — K649 Unspecified hemorrhoids: Secondary | ICD-10-CM

## 2015-11-03 DIAGNOSIS — I5022 Chronic systolic (congestive) heart failure: Secondary | ICD-10-CM

## 2015-11-03 DIAGNOSIS — J410 Simple chronic bronchitis: Secondary | ICD-10-CM

## 2015-11-03 DIAGNOSIS — D62 Acute posthemorrhagic anemia: Secondary | ICD-10-CM

## 2015-11-03 LAB — COMPREHENSIVE METABOLIC PANEL
ALBUMIN: 3 g/dL — AB (ref 3.5–5.0)
ALT: 49 U/L (ref 14–54)
AST: 47 U/L — AB (ref 15–41)
Alkaline Phosphatase: 106 U/L (ref 38–126)
Anion gap: 9 (ref 5–15)
CALCIUM: 8.6 mg/dL — AB (ref 8.9–10.3)
CO2: 25 mmol/L (ref 22–32)
CREATININE: 0.54 mg/dL (ref 0.44–1.00)
Chloride: 104 mmol/L (ref 101–111)
GFR calc Af Amer: >60 mL/min (ref >60)
GFR calc non Af Amer: >60 mL/min (ref >60)
Glucose, Bld: 164 mg/dL — ABNORMAL HIGH (ref 65–99)
POTASSIUM: 3.2 mmol/L — AB (ref 3.5–5.1)
SODIUM: 138 mmol/L (ref 135–145)
TOTAL PROTEIN: 6 g/dL — AB (ref 6.5–8.1)
Total Bilirubin: 0.3 mg/dL (ref 0.3–1.2)

## 2015-11-03 LAB — GLUCOSE, CAPILLARY
GLUCOSE-CAPILLARY: 191 mg/dL — AB (ref 65–99)
GLUCOSE-CAPILLARY: 205 mg/dL — AB (ref 65–99)
Glucose-Capillary: 205 mg/dL — ABNORMAL HIGH (ref 65–99)

## 2015-11-03 LAB — CBC
HCT: 36 % (ref 36.0–46.0)
HEMOGLOBIN: 10.9 g/dL — AB (ref 12.0–15.0)
MCH: 22.6 pg — ABNORMAL LOW (ref 26.0–34.0)
MCHC: 30.3 g/dL (ref 30.0–36.0)
MCV: 74.7 fL — ABNORMAL LOW (ref 78.0–100.0)
Platelets: 158 10*3/uL (ref 150–400)
RBC: 4.82 MIL/uL (ref 3.87–5.11)
RDW: 19.4 % — AB (ref 11.5–15.5)
WBC: 8.2 10*3/uL (ref 4.0–10.5)

## 2015-11-03 MED ORDER — POTASSIUM CHLORIDE CRYS ER 20 MEQ PO TBCR
40.0000 meq | EXTENDED_RELEASE_TABLET | Freq: Once | ORAL | Status: AC
  Administered 2015-11-03: 40 meq via ORAL
  Filled 2015-11-03: qty 2

## 2015-11-03 MED ORDER — FERROUS SULFATE 325 (65 FE) MG PO TBEC: 325.0000 mg | DELAYED_RELEASE_TABLET | Freq: Every day | ORAL | Status: DC

## 2015-11-03 NOTE — Discharge Summary (Signed)
Physician Discharge Summary  [name] ZOX:[id number] DOB: 1960-11-01 DOA: 11/01/2015  PCP: Lupe Carney, MD  Admit date: 11/01/2015 Discharge date: 11/03/2015  Recommendations for Outpatient Follow-up:  1. Follow up with Dr. Madilyn Fireman in 2-4 weeks as needed 2. CBC and anemia follow up with PCP in 2 weeks.  Further management of blood pressure which remains high 3. F/u with cardiology, Dr. Elease Hashimoto in 2-4 weeks.    Discharge Diagnoses:  Principal Problem:   Bleeding hemorrhoids Active Problems:   Hypertension   Anxiety   COPD (chronic obstructive pulmonary disease) (HCC)   Depression   Diabetes mellitus without complication (HCC)   Chronic systolic CHF (congestive heart failure) (HCC)   Hematochezia   Acute blood loss anemia   Discharge Condition: stable, improved  Diet recommendation: healthy heart  Wt Readings from Last 3 Encounters:  11/03/15 76.658 kg (169 lb)  10/25/15 75.297 kg (166 lb)  10/24/15 73.936 kg (163 lb)    History of present illness:   [name] is a 55 y.o. female with medical history significant of DM2, HTN, atrial fibrillation on aspirin who presented to the ED with c/o passing bright red blood and clots with bowel movements for two days prior to admission.  Associated stabbing and burning sensation in LLQ abdomen. Last colonoscopy was 3 years prior to admission and was normal per her report, however, she has a history of hemorrhoids.  CT scan demonstrated fatty liver and right nephrolithiasis, but was otherwise unremarkable.    Hospital Course:   Acute blood loss anemia due to BRBPR, likely due to hemorrhoids and has since stopped.  No bowel movements occurred during admission.  She was seen by gastroenterology, Dr. Madilyn Fireman, who reviewed her colonoscopy and her symptoms and felt her bleeding was perianal/hemorrhodial.  She was able to tolerate a regular diet.  Her hemoglobin stabilized around 10.9mg /dl and she did not require blood transfusions.  She will  follow up with Dr. Madilyn Fireman in 2-4 weeks or sooner as needed.    Unclear history of atrial fibrillation, per cardiology notes, she has worn an event monitor without significant arrhythmias. Follow up with cardiology.  Will continue her aspirin for now given recent GI bleed.    Chronic systolic heart failure with EF 20-25%.  Continue ARB, BB.  Euvolemic and currently not on diuretics.  Follow up with cardiology as outpatient at regularly scheduled appointments.  GERD, stable, continue PPI.  Hypokalemia, recurrent, continue potassium repletion.   Diabetes mellitus type 2, with hyperglycemia, given SSI during hospitalization.   Depression/anxiety, stable, continue xanax, fluoxetine.  COPD, stable cough.  Continue Spiriva   Procedures:  none  Consultations:  Dr. Madilyn Fireman, GI  Discharge Exam: Filed Vitals:   11/03/15 0453 11/03/15 0840  BP: 152/86   Pulse: 85 76  Temp: 98.8 F (37.1 C)   Resp: 18 18   Filed Vitals:   11/02/15 2037 11/02/15 2130 11/03/15 0453 11/03/15 0840  BP: 165/77 132/69 152/86   Pulse: 81 76 85 76  Temp: 98.8 F (37.1 C) 99.1 F (37.3 C) 98.8 F (37.1 C)   TempSrc: Oral Oral Oral   Resp: 18 18 18 18   Height:      Weight:   76.658 kg (169 lb)   SpO2: 94%  94% 94%    General: adult female, NAD, coughing Cardiovascular: RRR, no mrg Respiratory: rhonchi, no wheeze or rales, no increased WOB ABD:  NABS, soft, minimal TTP in the LLQ without rebound or guarding, nondistended MSK:  No  lower extremity edema, normal tone and bulk Neuro:  Grossly intact  Discharge Instructions      Discharge Instructions    Call MD for:  difficulty breathing, headache or visual disturbances    Complete by:  As directed      Call MD for:  extreme fatigue    Complete by:  As directed      Call MD for:  hives    Complete by:  As directed      Call MD for:  persistant dizziness or light-headedness    Complete by:  As directed      Call MD for:  persistant nausea and  vomiting    Complete by:  As directed      Call MD for:  severe uncontrolled pain    Complete by:  As directed      Call MD for:  temperature >100.4    Complete by:  As directed      Diet - low sodium heart healthy    Complete by:  As directed      Diet Carb Modified    Complete by:  As directed      Discharge instructions    Complete by:  As directed   It is okay to resume your aspirin.  Follow up with Dr. Madilyn Fireman if needed.  Please take some iron tabs for the next few weeks to boost your blood counts.  Please schedule an appointment with Dr. Elease Hashimoto, Cardiologist, to discuss your heart failure and blood pressure.     Increase activity slowly    Complete by:  As directed             Medication List    STOP taking these medications        diphenhydrAMINE 25 MG tablet  Commonly known as:  BENADRYL     imipramine 25 MG tablet  Commonly known as:  TOFRANIL     loperamide 2 MG tablet  Commonly known as:  IMODIUM A-D     miconazole 2 % vaginal cream  Commonly known as:  MONISTAT 7      TAKE these medications        albuterol 108 (90 Base) MCG/ACT inhaler  Commonly known as:  PROVENTIL HFA;VENTOLIN HFA  Inhale 2 puffs into the lungs every 4 (four) hours as needed for wheezing. Wheezing     ALPRAZolam 0.5 MG tablet  Commonly known as:  XANAX  Take 1 mg by mouth 2 (two) times daily. Anxiety     aspirin EC 81 MG tablet  Take 1 tablet (81 mg total) by mouth daily.     bisoprolol 5 MG tablet  Commonly known as:  ZEBETA  Take 0.5 tablets (2.5 mg total) by mouth daily.     ferrous sulfate 325 (65 FE) MG EC tablet  Take 1 tablet (325 mg total) by mouth daily with breakfast.     FLUoxetine 40 MG capsule  Commonly known as:  PROZAC  Take 40 mg by mouth at bedtime.     glimepiride 4 MG tablet  Commonly known as:  AMARYL  Take 1 tablet (4 mg total) by mouth daily with breakfast.     hydrOXYzine 10 MG tablet  Commonly known as:  ATARAX/VISTARIL  Take 10 mg by mouth every 6  (six) hours as needed for itching.     omeprazole 20 MG capsule  Commonly known as:  PRILOSEC  Take 20 mg by mouth 2 (two) times daily.  oxyCODONE-acetaminophen 5-325 MG tablet  Commonly known as:  PERCOCET/ROXICET  Take 1-2 tablets by mouth every 8 (eight) hours as needed for severe pain (for pain.).     pravastatin 40 MG tablet  Commonly known as:  PRAVACHOL  Take 40 mg by mouth daily.     sitaGLIPtin 100 MG tablet  Commonly known as:  JANUVIA  Take 100 mg by mouth daily.     tiotropium 18 MCG inhalation capsule  Commonly known as:  SPIRIVA  Place 18 mcg into inhaler and inhale daily.     valsartan 160 MG tablet  Commonly known as:  DIOVAN  Take 160 mg by mouth daily with breakfast.       Follow-up Information    Follow up with Lupe Carney, MD. Schedule an appointment as soon as possible for a visit in 1 month.   Specialty:  Family Medicine   Contact information:   [address] [place] [address] [phone]       Follow up with HAYES,JOHN C, MD. Schedule an appointment as soon as possible for a visit in 1 month.   Specialty:  Gastroenterology   Why:  As needed   Contact information:   [address] [place] [address] [phone]       Follow up with Nahser, Deloris Ping, MD. Schedule an appointment as soon as possible for a visit in 3 weeks.   Specialty:  Cardiology   Contact information:   [address] [place] [address] [phone]        The results of significant diagnostics from this hospitalization (including imaging, microbiology, ancillary and laboratory) are listed below for reference.    Significant Diagnostic Studies: Dg Chest 2 View  10/25/2015  CLINICAL DATA:  Acute onset of left-sided chest pain, radiating to the upper back. Dry cough and nausea. Initial encounter. EXAM: CHEST  2 VIEW COMPARISON:  Chest radiograph performed 07/30/2015 FINDINGS: The lungs are well-aerated and clear.  There is no evidence of focal opacification, pleural effusion or pneumothorax. The heart is normal in size; the mediastinal contour is within normal limits. No acute osseous abnormalities are seen. Clips are noted within the right upper quadrant, reflecting prior cholecystectomy. IMPRESSION: No acute cardiopulmonary process seen. Electronically Signed   By: Roanna Raider M.D.   On: 10/25/2015 00:50   Ct Abdomen Pelvis W Contrast  11/01/2015  CLINICAL DATA:  Left lower quadrant pain. EXAM: CT ABDOMEN AND PELVIS WITH CONTRAST TECHNIQUE: Multidetector CT imaging of the abdomen and pelvis was performed using the standard protocol following bolus administration of intravenous contrast. CONTRAST:  100 mL ISOVUE-300 IOPAMIDOL (ISOVUE-300) INJECTION 61% COMPARISON:  06/25/2012 FINDINGS: Lower chest:  No significant abnormality Hepatobiliary: There is diffuse fatty infiltration of the liver. No focal liver lesion. Prior cholecystectomy. No bile duct dilatation. Pancreas: Normal Spleen: Normal Adrenals/Urinary Tract: 3 x 5 mm calculus in the midpole right renal collecting system. No parenchymal renal abnormalities. Ureters and urinary bladder are unremarkable. Both adrenals are normal. Stomach/Bowel: There are normal appearances of the stomach, small bowel and colon. The appendix is normal. Vascular/Lymphatic: The abdominal aorta is normal in caliber. There is mild atherosclerotic calcification. There is no adenopathy in the abdomen or pelvis. Reproductive: Normal uterus and ovaries Other: No ascites. No acute inflammatory changes are evident in the abdomen or pelvis. Musculoskeletal: No significant skeletal lesions IMPRESSION: Diffuse fatty infiltration of the liver.  Right nephrolithiasis. Electronically Signed   By: Bevelyn Buckles  Mitchell M.D.   On: 11/01/2015 21:08    Microbiology: Recent Results (from the past 240 hour(s))  Wet prep, genital     Status: None   Collection Time: 10/26/15  1:08 AM  Result Value Ref  Range Status   Yeast Wet Prep HPF POC NONE SEEN NONE SEEN Final   Trich, Wet Prep NONE SEEN NONE SEEN Final   Clue Cells Wet Prep HPF POC NONE SEEN NONE SEEN Final   WBC, Wet Prep HPF POC NONE SEEN NONE SEEN Final    Comment: FEW BACTERIA SEEN   Sperm NONE SEEN  Final     Labs: Basic Metabolic Panel:  Recent Labs Lab 11/01/15 1927 11/02/15 0249 11/02/15 1642 11/03/15 0244  NA 134* 140 137 138  K 4.0 3.3* 3.8 3.2*  CL 102 107 107 104  CO2 20* GLUCOSE 566* 227* 440* 164*  BUN <5* <5* 6 <5*  CREATININE 0.75 0.53 0.63 0.54  CALCIUM 8.9 8.5* 8.1* 8.6*   Liver Function Tests:  Recent Labs Lab 11/01/15 1927 11/03/15 0244  AST 41 47*  ALT 49 49  ALKPHOS 108 106  BILITOT 0.4 0.3  PROT 6.1* 6.0*  ALBUMIN 3.2* 3.0*    Recent Labs Lab 11/01/15 1927  LIPASE 99*   No results for input(s): AMMONIA in the last 168 hours. CBC:  Recent Labs Lab 11/01/15 1927 11/02/15 0249 11/02/15 0813 11/03/15 0244  WBC 8.6 8.0 8.5 8.2  HGB 11.3* 10.6* 10.9* 10.9*  HCT 37.0 34.7* 36.4 36.0  MCV 74.9* 75.1* 74.7* 74.7*  PLT 155 147* 153 158   Cardiac Enzymes: No results for input(s): CKTOTAL, CKMB, CKMBINDEX, TROPONINI in the last 168 hours. BNP: BNP (last 3 results)  Recent Labs  10/18/15 2226  BNP 115.1*    ProBNP (last 3 results) No results for input(s): PROBNP in the last 8760 hours.  CBG:  Recent Labs Lab 11/02/15 1631 11/02/15 2032 11/03/15 0018 11/03/15 0446 11/03/15 0809  GLUCAP 423* 311* 205* 205* 191*    Time coordinatinClovis Rileycharge: 35 minutes  Signed:  Quinlin Conant  [facility] 11/03/2015, 10:03 AM

## 2015-11-13 ENCOUNTER — Emergency Department (HOSPITAL_COMMUNITY)
Admission: EM | Admit: 2015-11-13 | Discharge: 2015-11-13 | Disposition: A | Discharge status: Home / Self Care | Payer: Self-pay | Attending: Emergency Medicine | Admitting: Emergency Medicine

## 2015-11-13 ENCOUNTER — Encounter (HOSPITAL_COMMUNITY): Payer: Self-pay | Admitting: *Deleted

## 2015-11-13 DIAGNOSIS — F419 Anxiety disorder, unspecified: Secondary | ICD-10-CM | POA: Insufficient documentation

## 2015-11-13 DIAGNOSIS — E119 Type 2 diabetes mellitus without complications: Secondary | ICD-10-CM | POA: Insufficient documentation

## 2015-11-13 DIAGNOSIS — R531 Weakness: Secondary | ICD-10-CM | POA: Insufficient documentation

## 2015-11-13 DIAGNOSIS — R1032 Left lower quadrant pain: Secondary | ICD-10-CM | POA: Insufficient documentation

## 2015-11-13 DIAGNOSIS — I5022 Chronic systolic (congestive) heart failure: Secondary | ICD-10-CM | POA: Insufficient documentation

## 2015-11-13 DIAGNOSIS — I4891 Unspecified atrial fibrillation: Secondary | ICD-10-CM | POA: Insufficient documentation

## 2015-11-13 DIAGNOSIS — Z7982 Long term (current) use of aspirin: Secondary | ICD-10-CM | POA: Insufficient documentation

## 2015-11-13 DIAGNOSIS — R110 Nausea: Secondary | ICD-10-CM | POA: Insufficient documentation

## 2015-11-13 DIAGNOSIS — F17210 Nicotine dependence, cigarettes, uncomplicated: Secondary | ICD-10-CM | POA: Insufficient documentation

## 2015-11-13 DIAGNOSIS — Z9889 Other specified postprocedural states: Secondary | ICD-10-CM | POA: Insufficient documentation

## 2015-11-13 DIAGNOSIS — M1990 Unspecified osteoarthritis, unspecified site: Secondary | ICD-10-CM | POA: Insufficient documentation

## 2015-11-13 DIAGNOSIS — I10 Essential (primary) hypertension: Secondary | ICD-10-CM | POA: Insufficient documentation

## 2015-11-13 DIAGNOSIS — M5442 Lumbago with sciatica, left side: Secondary | ICD-10-CM | POA: Insufficient documentation

## 2015-11-13 DIAGNOSIS — K219 Gastro-esophageal reflux disease without esophagitis: Secondary | ICD-10-CM | POA: Insufficient documentation

## 2015-11-13 DIAGNOSIS — Z91040 Latex allergy status: Secondary | ICD-10-CM | POA: Insufficient documentation

## 2015-11-13 DIAGNOSIS — Z79899 Other long term (current) drug therapy: Secondary | ICD-10-CM | POA: Insufficient documentation

## 2015-11-13 DIAGNOSIS — G43909 Migraine, unspecified, not intractable, without status migrainosus: Secondary | ICD-10-CM | POA: Insufficient documentation

## 2015-11-13 DIAGNOSIS — Z880 Allergy status to penicillin: Secondary | ICD-10-CM | POA: Insufficient documentation

## 2015-11-13 DIAGNOSIS — G8929 Other chronic pain: Secondary | ICD-10-CM | POA: Insufficient documentation

## 2015-11-13 DIAGNOSIS — J449 Chronic obstructive pulmonary disease, unspecified: Secondary | ICD-10-CM | POA: Insufficient documentation

## 2015-11-13 LAB — URINE MICROSCOPIC-ADD ON

## 2015-11-13 LAB — URINALYSIS, ROUTINE W REFLEX MICROSCOPIC
BILIRUBIN URINE: NEGATIVE
Glucose, UA: >1000 mg/dL — AB
HGB URINE DIPSTICK: NEGATIVE
KETONES UR: NEGATIVE mg/dL
Leukocytes, UA: NEGATIVE
NITRITE: NEGATIVE
PROTEIN: NEGATIVE mg/dL
Specific Gravity, Urine: 1.046 — ABNORMAL HIGH (ref 1.005–1.030)
pH: 5.5 (ref 5.0–8.0)

## 2015-11-13 MED ORDER — CYCLOBENZAPRINE HCL 5 MG PO TABS: 5.0000 mg | ORAL_TABLET | Freq: Three times a day (TID) | ORAL | Status: DC | PRN

## 2015-11-13 MED ORDER — MELOXICAM 7.5 MG PO TABS: 7.5000 mg | ORAL_TABLET | Freq: Every day | ORAL | Status: DC

## 2015-11-13 MED ORDER — METHOCARBAMOL 500 MG PO TABS
1000.0000 mg | ORAL_TABLET | Freq: Once | ORAL | Status: AC
  Administered 2015-11-13: 1000 mg via ORAL
  Filled 2015-11-13: qty 2

## 2015-11-13 NOTE — ED Notes (Signed)
Patient c/o left lower back pain that travels into her buttock  Denies urinary symptoms

## 2015-11-13 NOTE — Discharge Instructions (Signed)
Read the information below.  Use the prescribed medication as directed.  Please discuss all new medications with your pharmacist.  While taking mobic/meloxicam DO NOT take additional NSAIDs (ibupfroen, aleeve, motrin). You are also prescribed a muscle relaxer, do not take in combination with xanax or percocet, space them out as each medication can cause respiratory depression. Be sure to follow up with your PCP for re-evaluation and management. You may return to the Emergency Department at any time for worsening condition or any new symptoms that concern you. Return to the ED if you develop fever, loss of bowel or bladder function, numbness on the inside of thighs, or weakness.    Results for orders placed or performed during the hospital encounter of 11/13/15  Urinalysis, Routine w reflex microscopic  Result Value Ref Range   Color, Urine YELLOW YELLOW   APPearance CLEAR CLEAR   Specific Gravity, Urine 1.046 (H) 1.005 - 1.030   pH 5.5 5.0 - 8.0   Glucose, UA >1000 (A) NEGATIVE mg/dL   Hgb urine dipstick NEGATIVE NEGATIVE   Bilirubin Urine NEGATIVE NEGATIVE   Ketones, ur NEGATIVE NEGATIVE mg/dL   Protein, ur NEGATIVE NEGATIVE mg/dL   Nitrite NEGATIVE NEGATIVE   Leukocytes, UA NEGATIVE NEGATIVE  Urine microscopic-add on  Result Value Ref Range   Squamous Epithelial / LPF 0-5 (A) NONE SEEN   WBC, UA 0-5 0 - 5 WBC/hpf   RBC / HPF 0-5 0 - 5 RBC/hpf   Bacteria, UA RARE (A) NONE SEEN      Sciatica Sciatica is pain, weakness, numbness, or tingling along your sciatic nerve. The nerve starts in the lower back and runs down the back of each leg. Nerve damage or certain conditions pinch or put pressure on the sciatic nerve. This causes the pain, weakness, and other discomforts of sciatica. HOME CARE   Only take medicine as told by your doctor.  Apply ice to the affected area for 20 minutes. Do this 3-4 times a day for the first 48-72 hours. Then try heat in the same way.  Exercise, stretch,  or do your usual activities if these do not make your pain worse.  Go to physical therapy as told by your doctor.  Keep all doctor visits as told.  Do not wear high heels or shoes that are not supportive.  Get a firm mattress if your mattress is too soft to lessen pain and discomfort. GET HELP RIGHT AWAY IF:   You cannot control when you poop (bowel movement) or pee (urinate).  You have more weakness in your lower back, lower belly (pelvis), butt (buttocks), or legs.  You have redness or puffiness (swelling) of your back.  You have a burning feeling when you pee.  You have pain that gets worse when you lie down.  You have pain that wakes you from your sleep.  Your pain is worse than past pain.  Your pain lasts longer than 4 weeks.  You are suddenly losing weight without reason. MAKE SURE YOU:   Understand these instructions.  Will watch this condition.  Will get help right away if you are not doing well or get worse.   This information is not intended to replace advice given to you by your health care provider. Make sure you discuss any questions you have with your health care provider.   Document Released: 03/23/2008 Document Revised: 03/05/2015 Document Reviewed: 10/24/2011 Elsevier Interactive Patient Education [employer]

## 2015-11-13 NOTE — ED Provider Notes (Signed)
CSN: [id number]     Arrival date & time 11/13/15  2006 History  By signing my name below, I, Tanda Rockers, attest that this documentation has been prepared under the direction and in the presence of Arvilla Meres, [name]. Electronically Signed: Tanda Rockers, ED Scribe. 11/13/2015. 8:46 PM.   Chief Complaint  Patient presents with  . Back Pain   The history is provided by the patient. No language interpreter was used.    HPI Comments: [name] is a 55 y.o. female who presents to the Emergency Department complaining of gradual onset, constant, 8/10, stabbing, worsening, lower back pain R>L radiating to left buttocks and down left leg for the past couple of days. Pt reports having issues with her left lower back for sometime but states that it has never radiated into her buttocks. The pain worsened after cleaning her house a couple of days ago. It is exacerbated with walking. Pt has been taking two 5mg  Percocet (1 tablet prescribed for the back pain by PCP per day) and 800 mg Ibuprofen every 8hrs with mild relief. Pt also complains of nausea and mild weakness to her left leg which she states is new. Last normal bowel movement today. Denies fever, chills, night sweats, numbness, urinary or bowel incontinence, diarrhea, constipation, dysuria, hematuria, saddle anesthesia, abdominal pain, vomiting, dizziness, lightheadedness, visual changes, or any other associated symptoms. No IVDA, unexpected weight loss, or hx cancer.  Past Medical History  Diagnosis Date  . Hypertension   . Anxiety   . Gallstones   . GERD (gastroesophageal reflux disease)   . COPD (chronic obstructive pulmonary disease) (HCC)   . Headache(784.0)     migraines  . Rash     arms  . Arthritis   . Depression   . Diabetes mellitus without complication (HCC)     Type II  . Dysrhythmia     pt unsure of name of arrythmia - " my heart rate will drop all of a sudden" -no current treatment - atrial flutter  . Osteomyelitis  (HCC)     R ankle  . Fibrillation, atrial (HCC)   . Chronic systolic heart failure (HCC)     EF 20-25% ECHO 05/2015   Past Surgical History  Procedure Laterality Date  . Ectopic pregnancy surgery  many yrs ago  . Carpal tunnel release      bil  . Cholecystectomy  11/24/2011    Procedure: LAPAROSCOPIC CHOLECYSTECTOMY WITH INTRAOPERATIVE CHOLANGIOGRAM;  Surgeon: Clovis Pu. Cornett, MD;  Location: [facility];  Service: General;  Laterality: N/A;  Laparoscopic Cholecystectomy with Cholangiogram  . Cardiac catheterization N/A 06/24/2015    Procedure: Left Heart Cath and Coronary Angiography;  Surgeon: [name], MD;  Location: [facility] INVASIVE CV LAB;  Service: Cardiovascular;  Laterality: N/A;  . Ankle fracture surgery Right 2015  . Hardware removal Right 07/15/2015    Procedure: RIGHT ANKLE HARDWARE REMOVAL, PLACEMENT OF STIMULAN ANTIBIOTIC BEADS AND PREVENA WOUND VAC.;  Surgeon: [name], MD;  Location: [facility] OR;  Service: Orthopedics;  Laterality: Right;   Family History  Problem Relation Age of Onset  . Breast cancer Sister   . Lung cancer Father    Social History  Substance Use Topics  . Smoking status: Current Every Day Smoker -- 0.50 packs/day for 20 years    Types: Cigarettes  . Smokeless tobacco: Never Used  . Alcohol Use: No   OB History    Gravida Para Term Preterm AB TAB SAB Ectopic Multiple Living  Review of Systems  Constitutional: Negative for fever, chills and diaphoresis.  Eyes: Negative for visual disturbance.  Respiratory: Negative for shortness of breath.   Cardiovascular: Negative for chest pain.  Gastrointestinal: Positive for nausea. Negative for vomiting, abdominal pain, diarrhea and constipation.  Genitourinary: Negative for dysuria, frequency and hematuria.  Musculoskeletal: Positive for back pain and arthralgias.  Neurological: Positive for weakness (LLE). Negative for dizziness, light-headedness and numbness.   Allergies   Biaxin; Clarithromycin; Lisinopril; Sulfa antibiotics; Chlorthalidone; Daptomycin; Amoxicillin-pot clavulanate; Aspirin; Cholestyramine; Dilaudid; Lasix; and Latex  Home Medications   Prior to Admission medications   Medication Sig Start Date End Date Taking? Authorizing Provider  albuterol (PROVENTIL HFA;VENTOLIN HFA) 108 (90 BASE) MCG/ACT inhaler Inhale 2 puffs into the lungs every 4 (four) hours as needed for wheezing. Wheezing   Yes Historical Provider, MD  ALPRAZolam (XANAX) 0.5 MG tablet Take 1 mg by mouth 2 (two) times daily. Anxiety   Yes Historical Provider, MD  aspirin EC 81 MG tablet Take 1 tablet (81 mg total) by mouth daily. 07/25/15  Yes [name], MD  bisoprolol (ZEBETA) 5 MG tablet Take 0.5 tablets (2.5 mg total) by mouth daily. 07/25/15  Yes [name], MD  ferrous sulfate 325 (65 FE) MG EC tablet Take 1 tablet (325 mg total) by mouth daily with breakfast. 11/03/15  Yes [name], MD  FLUoxetine (PROZAC) 40 MG capsule Take 40 mg by mouth at bedtime.    Yes Historical Provider, MD  glimepiride (AMARYL) 4 MG tablet Take 1 tablet (4 mg total) by mouth daily with breakfast. 10/25/15  Yes [name], MD  hydrOXYzine (ATARAX/VISTARIL) 10 MG tablet Take 10 mg by mouth every 6 (six) hours as needed for itching.    Yes Historical Provider, MD  omeprazole (PRILOSEC) 20 MG capsule Take 20 mg by mouth 2 (two) times daily.    Yes Historical Provider, MD  pravastatin (PRAVACHOL) 40 MG tablet Take 40 mg by mouth daily.   Yes Historical Provider, MD  sitaGLIPtin (JANUVIA) 100 MG tablet Take 100 mg by mouth daily.    Yes Historical Provider, MD  tiotropium (SPIRIVA) 18 MCG inhalation capsule Place 18 mcg into inhaler and inhale daily.   Yes Historical Provider, MD  valsartan (DIOVAN) 160 MG tablet Take 160 mg by mouth daily with breakfast.    Yes Historical Provider, MD  cyclobenzaprine (FLEXERIL) 5 MG tablet Take 1 tablet (5 mg total) by mouth 3 (three) times daily as needed  for muscle spasms. 11/13/15   [name], [name]  meloxicam (MOBIC) 7.5 MG tablet Take 1 tablet (7.5 mg total) by mouth daily. 11/13/15   [name], [name]   BP 146/86 mmHg  Pulse 85  Temp(Src) 98.2 F (36.8 C) (Oral)  Resp 19  Ht  (1.702 m)  Wt 74.39 kg  BMI 25.68 kg/m2  SpO2 99%  LMP 07/24/2011   Physical Exam  Constitutional: She appears well-developed and well-nourished. No distress.  HENT:  Head: Normocephalic and atraumatic.  Mouth/Throat: Oropharynx is clear and moist.  Eyes: Conjunctivae and EOM are normal. Pupils are equal, round, and reactive to light. No scleral icterus.  Neck: Normal range of motion. Neck supple. No tracheal deviation present.  Cardiovascular: Regular rhythm, normal heart sounds and intact distal pulses.  Tachycardia present.   No murmur heard. Pulmonary/Chest: Effort normal and breath sounds normal. No respiratory distress. She has no wheezes.  She has no rales.  Abdominal: Bowel sounds are normal. There is tenderness (LLQ (chronic per patient)).  Musculoskeletal: Normal range of motion. She exhibits tenderness.  Positive left SLR. TTP of the bilateral lumbar paraspinal muscles. TTP to left gluteus maximus.  No step offs.   Neurological: She is alert. She has normal strength. No sensory deficit. Coordination and gait normal.  Skin: Skin is warm and dry. No rash noted.  Psychiatric: She has a normal mood and affect. Her behavior is normal.  Nursing note and vitals reviewed.   ED Course  Procedures (including critical care time)  DIAGNOSTIC STUDIES: Oxygen Saturation is 97% on RA, normal by my interpretation.    COORDINATION OF CARE: 8:42 PM-Discussed treatment plan which includes Rx muscle relaxant with pt at bedside and pt agreed to plan.   Labs Review Labs Reviewed  URINALYSIS, ROUTINE W REFLEX MICROSCOPIC (NOT AT [facility]) - Abnormal; Notable for the following:    Specific Gravity, Urine 1.046 (*)    Glucose, UA >1000 (*)     All other components within normal limits  URINE MICROSCOPIC-ADD ON - Abnormal; Notable for the following:    Squamous Epithelial / LPF 0-5 (*)    Bacteria, UA RARE (*)    All other components within normal limits  URINE CULTURE    Imaging Review No results found. I have personally reviewed and evaluated these images and lab results as part of my medical decision-making.   EKG Interpretation None      MDM   Final diagnoses:  Bilateral low back pain with left-sided sciatica   TITA TERHAAR is a 55 y.o. female presents to ED with complaint of b/l worsening low back pain with radiation in left buttocks and leg. Endorses recent increase in physical activity. Patient denies red flag symptoms. Patient is afebrile and non-toxic. HR tachycardic, vital signs otherwise stable. Neurologic exam remarkable for positive SLR on left side; otherwise neurologic exam normal. Doubt cauda equina or epidural abscess. U/A shows some dehydration and glucose, review of records show chronic glucosuria. Patient states she has a follow up with PCP soon regarding blood sugars and diabetes management. Some bacteria as well as squamous epithelials cells, no urinary complaints - ?specimen contamination, will send for culture.   Suspect left sided sciatica. Provided patient muscle relaxer in ED. Will prescribe mobic and flexeril for symptomatic management. Encouraged patient to not take other NSAIDs while taking mobic. Discussed use of heat or ice for further relief. Follow up with PCP in next few days for re-evaluation. Discussed return precautions. Patient voiced understanding and is agreeable.   I personally performed the services described in this documentation, which was scribed in my presence. The recorded information has been reviewed and is accurate.      [name], [name] 11/13/15 [id number]  Nelva Nay, MD 11/15/15 [phone]

## 2015-11-13 NOTE — ED Notes (Signed)
PA at bedside.

## 2015-11-28 ENCOUNTER — Ambulatory Visit: Payer: Medicaid Other | Admitting: Cardiovascular Disease

## 2015-11-30 ENCOUNTER — Emergency Department (HOSPITAL_COMMUNITY)
Admission: EM | Admit: 2015-11-30 | Discharge: 2015-11-30 | Disposition: A | Discharge status: Home / Self Care | Payer: MEDICAID | Attending: Emergency Medicine | Admitting: Emergency Medicine

## 2015-11-30 ENCOUNTER — Encounter (HOSPITAL_COMMUNITY): Payer: Self-pay | Admitting: Nurse Practitioner

## 2015-11-30 DIAGNOSIS — I5022 Chronic systolic (congestive) heart failure: Secondary | ICD-10-CM | POA: Insufficient documentation

## 2015-11-30 DIAGNOSIS — Z91040 Latex allergy status: Secondary | ICD-10-CM | POA: Insufficient documentation

## 2015-11-30 DIAGNOSIS — I4891 Unspecified atrial fibrillation: Secondary | ICD-10-CM | POA: Insufficient documentation

## 2015-11-30 DIAGNOSIS — F17210 Nicotine dependence, cigarettes, uncomplicated: Secondary | ICD-10-CM | POA: Insufficient documentation

## 2015-11-30 DIAGNOSIS — Z7984 Long term (current) use of oral hypoglycemic drugs: Secondary | ICD-10-CM | POA: Insufficient documentation

## 2015-11-30 DIAGNOSIS — E119 Type 2 diabetes mellitus without complications: Secondary | ICD-10-CM | POA: Insufficient documentation

## 2015-11-30 DIAGNOSIS — I110 Hypertensive heart disease with heart failure: Secondary | ICD-10-CM | POA: Insufficient documentation

## 2015-11-30 DIAGNOSIS — G8929 Other chronic pain: Secondary | ICD-10-CM | POA: Insufficient documentation

## 2015-11-30 DIAGNOSIS — Z79899 Other long term (current) drug therapy: Secondary | ICD-10-CM | POA: Insufficient documentation

## 2015-11-30 DIAGNOSIS — Z7982 Long term (current) use of aspirin: Secondary | ICD-10-CM | POA: Insufficient documentation

## 2015-11-30 DIAGNOSIS — M5441 Lumbago with sciatica, right side: Secondary | ICD-10-CM | POA: Insufficient documentation

## 2015-11-30 DIAGNOSIS — J449 Chronic obstructive pulmonary disease, unspecified: Secondary | ICD-10-CM | POA: Insufficient documentation

## 2015-11-30 MED ORDER — MELOXICAM 7.5 MG PO TABS: 7.5000 mg | ORAL_TABLET | Freq: Every day | ORAL | Status: DC

## 2015-11-30 MED ORDER — CYCLOBENZAPRINE HCL 10 MG PO TABS: 10.0000 mg | ORAL_TABLET | Freq: Every day | ORAL | Status: DC

## 2015-11-30 MED ORDER — PREDNISONE 50 MG PO TABS: 50.0000 mg | ORAL_TABLET | Freq: Every day | ORAL | Status: DC

## 2015-11-30 NOTE — ED Notes (Signed)
Per pt - Dr Ophelia Charter performed 1st ankle surgery - Dr August Saucer performed 2nd and removed hardware and bone infection.

## 2015-11-30 NOTE — ED Notes (Signed)
K Gekas, PA, in w/pt. 

## 2015-11-30 NOTE — ED Provider Notes (Signed)
CSN: [id number]     Arrival date & time 11/30/15  1313 History  By signing my name below, I, [name], attest that this documentation has been prepared under the direction and in the presence of [name], [name] Electronically Signed: Soijett [name], ED Scribe. 11/30/2015. 3:40 PM.   Chief Complaint  Patient presents with  . Back Pain   The history is provided by the patient. No language interpreter was used.   HPI Comments: [name] is a 55 y.o. female who presents to the Emergency Department complaining of sudden onset bilateral lower back pain, R>L onset yesterday. Patient was seen on 5/18 for the same symptoms. She was rx'ed flexeril and mobic which alleviated her symptoms. The pain came back yesterday - denies acute injury. She reports that the back pain does radiate to her right leg and she describes it as a shooting sensation. Pt lower back pain is worsened with movement. states that she is having associated symptoms of gait problem due to pain. She states that she has not tried any medications for the relief for her symptoms. Pt denies fever, chills, previous injury/trauma to back and and other symptoms. She sees [name] who recommended an MRI of her back and hip however she states she lost her insurance and was able unable to obtain one. Denies fever, IVDU, hx of cancer, loss of bowel/bladder function, urinary retention, saddle anesthesia.   Pt secondarily complains of right ankle pain. 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 She is status post hardware removal and has completed a full course of antibiotics prescribed however she has been unable to follow up with her orthopedist due to loss of insurance. She reports mild right ankle swelling. Denies redness or drainage. Pt has not tried any medications for the relief of her symptoms.    Past Medical History  Diagnosis Date  . Hypertension   . Anxiety   .  Gallstones   . GERD (gastroesophageal reflux disease)   . COPD (chronic obstructive pulmonary disease) (HCC)   . Headache(784.0)     migraines  . Rash     arms  . Arthritis   . Depression   . Diabetes mellitus without complication (HCC)     Type II  . Dysrhythmia     pt unsure of name of arrythmia - " my heart rate will drop all of a sudden" -no current treatment - atrial flutter  . Osteomyelitis (HCC)     R ankle  . Fibrillation, atrial (HCC)   . Chronic systolic heart failure (HCC)     EF 20-25% ECHO 05/2015   Past Surgical History  Procedure Laterality Date  . Ectopic pregnancy surgery  many yrs ago  . Carpal tunnel release      bil  . Cholecystectomy  11/24/2011    Procedure: LAPAROSCOPIC CHOLECYSTECTOMY WITH INTRAOPERATIVE CHOLANGIOGRAM;  Surgeon: Clovis Pu. Cornett, MD;  Location: [facility];  Service: General;  Laterality: N/A;  Laparoscopic Cholecystectomy with Cholangiogram  . Cardiac catheterization N/A 06/24/2015    Procedure: Left Heart Cath and Coronary Angiography;  Surgeon: [name], MD;  Location: [facility] INVASIVE CV LAB;  Service: Cardiovascular;  Laterality: N/A;  . Ankle fracture surgery Right 2015  . Hardware removal Right 07/15/2015    Procedure: RIGHT ANKLE HARDWARE REMOVAL, PLACEMENT OF STIMULAN ANTIBIOTIC BEADS AND PREVENA WOUND VAC.;  Surgeon: [name], MD;  Location: [facility] OR;  Service: Orthopedics;  Laterality: Right;   Family History  Problem Relation Age of Onset  . Breast cancer Sister   . Lung cancer Father    Social History  Substance Use Topics  . Smoking status: Current Every Day Smoker -- 0.50 packs/day for 20 years    Types: Cigarettes  . Smokeless tobacco: Never Used  . Alcohol Use: No   OB History    Gravida Para Term Preterm AB TAB SAB Ectopic Multiple Living   9    5  4 1  4      Review of Systems  Constitutional: Negative for fever and chills.  Musculoskeletal: Positive for back pain, joint swelling, arthralgias and gait  problem (due to pain).  Skin: Negative for color change, rash and wound.  Neurological: Negative for numbness.    Allergies  Biaxin; Clarithromycin; Lisinopril; Sulfa antibiotics; Chlorthalidone; Daptomycin; Amoxicillin-pot clavulanate; Aspirin; Cholestyramine; Dilaudid; Lasix; and Latex  Home Medications   Prior to Admission medications   Medication Sig Start Date End Date Taking? Authorizing Provider  albuterol (PROVENTIL HFA;VENTOLIN HFA) 108 (90 BASE) MCG/ACT inhaler Inhale 2 puffs into the lungs every 4 (four) hours as needed for wheezing. Wheezing    Historical Provider, MD  ALPRAZolam [name]) 0.5 MG tablet Take 1 mg by mouth 2 (two) times daily. Anxiety    Historical Provider, MD  aspirin EC 81 MG tablet Take 1 tablet (81 mg total) by mouth daily. 07/25/15   [name], MD  bisoprolol (ZEBETA) 5 MG tablet Take 0.5 tablets (2.5 mg total) by mouth daily. 07/25/15   [name], MD  cyclobenzaprine (FLEXERIL) 5 MG tablet Take 1 tablet (5 mg total) by mouth 3 (three) times daily as needed for muscle spasms. 11/13/15   [name], [name]  ferrous sulfate 325 (65 FE) MG EC tablet Take 1 tablet (325 mg total) by mouth daily with breakfast. 11/03/15   [name], MD  FLUoxetine (PROZAC) 40 MG capsule Take 40 mg by mouth at bedtime.     Historical Provider, MD  glimepiride (AMARYL) 4 MG tablet Take 1 tablet (4 mg total) by mouth daily with breakfast. 10/25/15   [name], MD  hydrOXYzine (ATARAX/VISTARIL) 10 MG tablet Take 10 mg by mouth every 6 (six) hours as needed for itching.     Historical Provider, MD  meloxicam (MOBIC) 7.5 MG tablet Take 1 tablet (7.5 mg total) by mouth daily. 11/13/15   [name], [name]  omeprazole (PRILOSEC) 20 MG capsule Take 20 mg by mouth 2 (two) times daily.     Historical Provider, MD  pravastatin (PRAVACHOL) 40 MG tablet Take 40 mg by mouth daily.    Historical Provider, MD  sitaGLIPtin (JANUVIA) 100 MG tablet Take 100 mg by mouth  daily.     Historical Provider, MD  tiotropium (SPIRIVA) 18 MCG inhalation capsule Place 18 mcg into inhaler and inhale daily.    Historical Provider, MD  valsartan (DIOVAN) 160 MG tablet Take 160 mg by mouth daily with breakfast.     Historical Provider, MD   BP 146/72 mmHg  Pulse 78  Temp(Src) 97.9 F (36.6 C) (Oral)  Resp 17  SpO2 96%  LMP 07/24/2011   Physical Exam  Constitutional: She is oriented to person, place, and time. She appears well-developed and well-nourished. No distress.  HENT:  Head: Normocephalic and atraumatic.  Eyes: Conjunctivae are normal. Pupils are equal, round, and reactive to light. Right eye exhibits no discharge. Left eye exhibits no discharge. No scleral icterus.  Neck: Normal range of motion.  Cardiovascular: Normal rate.   Pulmonary/Chest: Effort normal. No respiratory distress.  Abdominal: She exhibits no distension.  Musculoskeletal:  Back:  Inspection: No masses, deformity, or rash Palpation: Lumbar spinal tenderness with tenderness into the sacrum. Bilateral lumbar paraspinal muscle tenderness. ROM: Normal flexion, extension, lateral rotation and flexion of back. Pain with ROM.  Strength: 5/5 in lower extremities and normal plantar and dorsiflexion. No foot drop. Sensation: decreased sensation with light touch to right lower extremities.  Gait: antalgic gait Reflexes: Patellar reflex is 2+ bilaterally SLR: bilateral positive seated straight leg raise  Neurological: She is alert and oriented to person, place, and time.  Skin: Skin is warm and dry.  Well healed vertical scar over the right lateral malleolus. No redness or drainage.  Psychiatric: She has a normal mood and affect. Her behavior is normal.  Nursing note and vitals reviewed.   ED Course  Procedures (including critical care time) DIAGNOSTIC STUDIES: Oxygen Saturation is 96% on RA, nl by my interpretation.    COORDINATION OF CARE: 3:40 PM Discussed treatment plan with pt at  bedside which includes muscle relaxer Rx, anti-inflammatory Rx, and steroid Rx, follow up with [name] orthopedist and pt agreed to plan.   MDM   Final diagnoses:  Bilateral low back pain with right-sided sciatica   55 year old female with acute on chronic back pain.  Some concern for HNP due to numbness and sciatic pain, however patient is ambulatory without evidence of weakness. No loss of bowel or bladder control.  No concern for cauda equina.  No fever, night sweats, weight loss, h/o cancer, IVDA, no recent procedure to back. No urinary symptoms suggestive of UTI. Pt instructed to follow up with [name], orthopedist. Will discharge with steroids, flexeril, and mobic. There is mild edema of the right ankle. Discussed this may be a chronic problem due to multiple procedures on her ankle and hx of fracture. No signs of infection - no redness, drainage, incision site is clean and dry. Supportive care and return precaution discussed. Appears safe for discharge at this time. Follow up as indicated in discharge paperwork.   I personally performed the services described in this documentation, which was scribed in my presence. The recorded information has been reviewed and is accurate.   [name], [name] 12/01/15 [id number]  Arby Barrette, MD 12/01/15 [phone]

## 2015-11-30 NOTE — ED Notes (Addendum)
Pt changing into gown. Pt's significant other arrived to room.

## 2015-11-30 NOTE — ED Notes (Addendum)
She c/o several week history of lower back pain. She was here for this pain in may, was given flexeril but symptoms have not improved and have now started to radiate down her R leg. Pain is increased with ambulation. Denies any bowel/bladder changes

## 2015-12-01 ENCOUNTER — Encounter: Payer: Self-pay | Admitting: Family Medicine

## 2015-12-01 ENCOUNTER — Ambulatory Visit (INDEPENDENT_AMBULATORY_CARE_PROVIDER_SITE_OTHER): Payer: Medicaid Other | Admitting: Family Medicine

## 2015-12-01 VITALS — BP 131/79 | HR 74 | Temp 98.4°F | Ht 67.0 in | Wt 167.2 lb

## 2015-12-01 DIAGNOSIS — K4090 Unilateral inguinal hernia, without obstruction or gangrene, not specified as recurrent: Secondary | ICD-10-CM

## 2015-12-02 NOTE — Progress Notes (Signed)
   Subjective:    Patient ID: [name], female    DOB: Apr 08, 1961, 55 y.o.   MRN: [id number]  HPI Patient seen for LLQ abdominal pain that has been occuring for several months.  Patient was seen in MAU in 4/30 for it.  Pain worse when sitting and urinating or defecating.  Improved with rest.  Pain not constant.  Improved with sitting or laying down.  Had CT of abd/pelvis on 5/6, which showed normal uterus, ovaries.  Was also admitted for rectal bleeding and bleeding hemorrhoids on 5/6.  I have reviewed the patients past medical, family, and social history.  I have reviewed the patient's medication list and allergies.    Review of Systems  All other systems reviewed and are negative.      Objective:   Physical Exam  Constitutional: She is oriented to person, place, and time. She appears well-developed and well-nourished.  HENT:  Head: Normocephalic and atraumatic.  Right Ear: External ear normal.  Left Ear: External ear normal.  Cardiovascular: Normal rate, regular rhythm and normal heart sounds.   Pulmonary/Chest: Effort normal and breath sounds normal. No respiratory distress. She has no wheezes. She has no rales. She exhibits no tenderness.  Abdominal: Soft. Bowel sounds are normal. She exhibits no distension and no mass. There is tenderness (left inguinal canal). There is no rebound and no guarding. A hernia is present. Hernia confirmed positive in the left inguinal area.  Neurological: She is alert and oriented to person, place, and time.  Skin: Skin is warm and dry. No rash noted. No erythema. No pallor.  Psychiatric: She has a normal mood and affect. Her behavior is normal. Judgment and thought content normal.      Assessment & Plan:  1. Left inguinal hernia Discussed that patient would need referral to general surgeon for evaluation.  No active gyn problems.  Discussed need for PAP smear.  As patient doesn't currently have insurance, information for free PAP smear clinic  given.

## 2015-12-11 ENCOUNTER — Ambulatory Visit: Payer: Self-pay | Admitting: Internal Medicine

## 2015-12-17 ENCOUNTER — Ambulatory Visit: Payer: Self-pay | Admitting: Internal Medicine

## 2015-12-22 ENCOUNTER — Encounter: Payer: Self-pay | Admitting: Cardiovascular Disease

## 2016-04-01 ENCOUNTER — Encounter (HOSPITAL_COMMUNITY): Payer: Self-pay | Admitting: *Deleted

## 2016-04-01 ENCOUNTER — Emergency Department (HOSPITAL_COMMUNITY)
Admission: EM | Admit: 2016-04-01 | Discharge: 2016-04-02 | Disposition: A | Discharge status: Home / Self Care | Payer: Self-pay | Attending: Emergency Medicine | Admitting: Emergency Medicine

## 2016-04-01 ENCOUNTER — Emergency Department (HOSPITAL_COMMUNITY): Payer: Self-pay

## 2016-04-01 DIAGNOSIS — R05 Cough: Secondary | ICD-10-CM | POA: Insufficient documentation

## 2016-04-01 DIAGNOSIS — I110 Hypertensive heart disease with heart failure: Secondary | ICD-10-CM | POA: Insufficient documentation

## 2016-04-01 DIAGNOSIS — Z7984 Long term (current) use of oral hypoglycemic drugs: Secondary | ICD-10-CM | POA: Insufficient documentation

## 2016-04-01 DIAGNOSIS — I5022 Chronic systolic (congestive) heart failure: Secondary | ICD-10-CM | POA: Insufficient documentation

## 2016-04-01 DIAGNOSIS — Z7982 Long term (current) use of aspirin: Secondary | ICD-10-CM | POA: Insufficient documentation

## 2016-04-01 DIAGNOSIS — F17210 Nicotine dependence, cigarettes, uncomplicated: Secondary | ICD-10-CM | POA: Insufficient documentation

## 2016-04-01 DIAGNOSIS — J449 Chronic obstructive pulmonary disease, unspecified: Secondary | ICD-10-CM | POA: Insufficient documentation

## 2016-04-01 DIAGNOSIS — Z91040 Latex allergy status: Secondary | ICD-10-CM | POA: Insufficient documentation

## 2016-04-01 DIAGNOSIS — R6889 Other general symptoms and signs: Secondary | ICD-10-CM

## 2016-04-01 DIAGNOSIS — E119 Type 2 diabetes mellitus without complications: Secondary | ICD-10-CM | POA: Insufficient documentation

## 2016-04-01 LAB — CBC WITH DIFFERENTIAL/PLATELET
BASOS ABS: 0 10*3/uL (ref 0.0–0.1)
Basophils Relative: 0 %
EOS ABS: 0.3 10*3/uL (ref 0.0–0.7)
EOS PCT: 4 %
HCT: 45.4 % (ref 36.0–46.0)
Hemoglobin: 15.3 g/dL — ABNORMAL HIGH (ref 12.0–15.0)
LYMPHS PCT: 30 %
Lymphs Abs: 2.6 10*3/uL (ref 0.7–4.0)
MCH: 29 pg (ref 26.0–34.0)
MCHC: 33.7 g/dL (ref 30.0–36.0)
MCV: 86 fL (ref 78.0–100.0)
MONO ABS: 0.4 10*3/uL (ref 0.1–1.0)
Monocytes Relative: 5 %
Neutro Abs: 5.1 10*3/uL (ref 1.7–7.7)
Neutrophils Relative %: 61 %
Platelets: 133 10*3/uL — ABNORMAL LOW (ref 150–400)
RBC: 5.28 MIL/uL — ABNORMAL HIGH (ref 3.87–5.11)
RDW: 13.6 % (ref 11.5–15.5)
WBC: 8.5 10*3/uL (ref 4.0–10.5)

## 2016-04-01 LAB — COMPREHENSIVE METABOLIC PANEL
ALT: 37 U/L (ref 14–54)
AST: 26 U/L (ref 15–41)
Albumin: 3.9 g/dL (ref 3.5–5.0)
Alkaline Phosphatase: 112 U/L (ref 38–126)
Anion gap: 10 (ref 5–15)
CHLORIDE: 103 mmol/L (ref 101–111)
CO2: 25 mmol/L (ref 22–32)
Calcium: 9.4 mg/dL (ref 8.9–10.3)
Creatinine, Ser: 0.46 mg/dL (ref 0.44–1.00)
Glucose, Bld: 169 mg/dL — ABNORMAL HIGH (ref 65–99)
POTASSIUM: 3.2 mmol/L — AB (ref 3.5–5.1)
SODIUM: 138 mmol/L (ref 135–145)
Total Bilirubin: 0.6 mg/dL (ref 0.3–1.2)
Total Protein: 7.2 g/dL (ref 6.5–8.1)

## 2016-04-01 LAB — URINALYSIS, ROUTINE W REFLEX MICROSCOPIC
Bilirubin Urine: NEGATIVE
Glucose, UA: 100 mg/dL — AB
Hgb urine dipstick: NEGATIVE
Ketones, ur: NEGATIVE mg/dL
Leukocytes, UA: NEGATIVE
NITRITE: NEGATIVE
Protein, ur: NEGATIVE mg/dL
Specific Gravity, Urine: 1.015 (ref 1.005–1.030)
pH: 5.5 (ref 5.0–8.0)

## 2016-04-01 LAB — I-STAT BETA HCG BLOOD, ED (MC, WL, AP ONLY)

## 2016-04-01 MED ORDER — ONDANSETRON 4 MG PO TBDP
4.0000 mg | ORAL_TABLET | Freq: Once | ORAL | Status: AC
  Administered 2016-04-02: 4 mg via ORAL
  Filled 2016-04-01: qty 1

## 2016-04-01 NOTE — ED Triage Notes (Signed)
Patient presents with c/o cough, body aches, chills since yesterday

## 2016-04-02 MED ORDER — ONDANSETRON 4 MG PO TBDP: 4.0000 mg | ORAL_TABLET | Freq: Three times a day (TID) | ORAL | 1 refills | Status: DC | PRN

## 2016-04-02 MED ORDER — NAPROXEN 500 MG PO TABS: 500.0000 mg | ORAL_TABLET | Freq: Two times a day (BID) | ORAL | 0 refills | Status: DC

## 2016-04-02 MED ORDER — LOPERAMIDE HCL 2 MG PO TABS: 2.0000 mg | ORAL_TABLET | Freq: Four times a day (QID) | ORAL | 0 refills | Status: DC | PRN

## 2016-04-02 MED ORDER — DM-GUAIFENESIN ER 30-600 MG PO TB12: 1.0000 | ORAL_TABLET | Freq: Two times a day (BID) | ORAL | 1 refills | Status: DC

## 2016-04-02 NOTE — Discharge Instructions (Signed)
Take the Zofran as needed for nausea and vomiting. Imodium right ear as needed for the diarrhea. The Naprosyn for the bodyaches. Take Mucinex DM for the cough and congestion. Return for any new or worse symptoms. Make an appointment to follow-up with your regular doctor. Work note provided.

## 2016-04-02 NOTE — ED Provider Notes (Signed)
[facility]-EMERGENCY DEPT Provider Note   CSN: [id number] Arrival date & time: 04/01/16  2107     History   Chief Complaint Chief Complaint  Patient presents with  . Chills  . Generalized Body Aches  . Cough  . Diarrhea    HPI [name] is a 55 y.o. female.  Patient with acute onset cough bodyaches chills no distinct fever nausea vomiting and some diarrhea. No blood in either one. Noknown sick contacts. Cough is productive.      Past Medical History:  Diagnosis Date  . Anxiety   . Arthritis   . Chronic systolic heart failure (HCC)    EF 20-25% ECHO 05/2015  . COPD (chronic obstructive pulmonary disease) (HCC)   . Depression   . Diabetes mellitus without complication (HCC)    Type II  . Dysrhythmia    pt unsure of name of arrythmia - " my heart rate will drop all of a sudden" -no current treatment - atrial flutter  . Fibrillation, atrial (HCC)   . Gallstones   . GERD (gastroesophageal reflux disease)   . Headache(784.0)    migraines  . Hypertension   . Osteomyelitis (HCC)    R ankle  . Rash    arms    Patient Active Problem List   Diagnosis Date Noted  . Bleeding hemorrhoids 11/03/2015  . Acute blood loss anemia 11/03/2015  . Hematochezia 11/01/2015  . Back pain with sciatica 09/08/2015  . Infection of bone of ankle (HCC) 07/15/2015  . Chronic systolic CHF (congestive heart failure) (HCC) 06/24/2015  . Chest pain 06/03/2015  . Hypertension   . Anxiety   . Gallstones   . GERD (gastroesophageal reflux disease)   . COPD (chronic obstructive pulmonary disease) (HCC)   . Dysrhythmia   . Rash   . Arthritis   . Depression   . Diabetes mellitus without complication (HCC)   . Post-operative state 12/10/2011    Past Surgical History:  Procedure Laterality Date  . ANKLE FRACTURE SURGERY Right 2015  . CARDIAC CATHETERIZATION N/A 06/24/2015   Procedure: Left Heart Cath and Coronary Angiography;  Surgeon: [name], MD;  Location: [facility] INVASIVE CV LAB;   Service: Cardiovascular;  Laterality: N/A;  . CARPAL TUNNEL RELEASE     bil  . CHOLECYSTECTOMY  11/24/2011   Procedure: LAPAROSCOPIC CHOLECYSTECTOMY WITH INTRAOPERATIVE CHOLANGIOGRAM;  Surgeon: Clovis Pu. Cornett, MD;  Location: [facility] ORS;  Service: General;  Laterality: N/A;  Laparoscopic Cholecystectomy with Cholangiogram  . ECTOPIC PREGNANCY SURGERY  many yrs ago  . HARDWARE REMOVAL Right 07/15/2015   Procedure: RIGHT ANKLE HARDWARE REMOVAL, PLACEMENT OF STIMULAN ANTIBIOTIC BEADS AND PREVENA WOUND VAC.;  Surgeon: [name], MD;  Location: [facility] OR;  Service: Orthopedics;  Laterality: Right;    OB History    Gravida Para Term Preterm AB Living   9       5 4    SAB TAB Ectopic Multiple Live Births   4   1   4        Home Medications    Prior to Admission medications   Medication Sig Start Date End Date Taking? Authorizing Provider  albuterol (PROVENTIL HFA;VENTOLIN HFA) 108 (90 BASE) MCG/ACT inhaler Inhale 2 puffs into the lungs every 4 (four) hours as needed for wheezing. Wheezing    Historical Provider, MD  ALPRAZolam [name]) 0.5 MG tablet Take 1 mg by mouth 2 (two) times daily. Anxiety    Historical Provider, MD  aspirin EC 81 MG tablet Take  1 tablet (81 mg total) by mouth daily. Patient not taking: Reported on 12/01/2015 07/25/15   [name], MD  bisoprolol (ZEBETA) 5 MG tablet Take 0.5 tablets (2.5 mg total) by mouth daily. Patient not taking: Reported on 12/01/2015 07/25/15   [name], MD  cyclobenzaprine (FLEXERIL) 10 MG tablet Take 1 tablet (10 mg total) by mouth at bedtime. Patient not taking: Reported on 12/01/2015 11/30/15   [name], [name]  cyclobenzaprine (FLEXERIL) 5 MG tablet Take 1 tablet (5 mg total) by mouth 3 (three) times daily as needed for muscle spasms. 11/13/15   [name], [name]  dextromethorphan-guaiFENesin [facility] DM) 30-600 MG 12hr tablet Take 1 tablet by mouth 2 (two) times daily. 04/02/16   Vanetta Mulders, MD  Fe Cbn-Fe  Gluc-FA-B12-C-DSS (FERRALET 90) 90-1 MG TABS Take 1 tablet by mouth daily.    Historical Provider, MD  ferrous sulfate 325 (65 FE) MG EC tablet Take 1 tablet (325 mg total) by mouth daily with breakfast. 11/03/15   [name], MD  FLUoxetine (PROZAC) 40 MG capsule Take 40 mg by mouth at bedtime.     Historical Provider, MD  glimepiride (AMARYL) 4 MG tablet Take 1 tablet (4 mg total) by mouth daily with breakfast. 10/25/15   [name], MD  glucose blood (ACCU-CHEK AVIVA PLUS) test strip 1 strip by Does not apply route 4 (four) times daily. 08/12/15   Historical Provider, MD  hydrOXYzine (ATARAX/VISTARIL) 10 MG tablet Take 10 mg by mouth every 6 (six) hours as needed for itching. Reported on 12/01/2015    Historical Provider, MD  loperamide (IMODIUM A-D) 2 MG tablet Take 1 tablet (2 mg total) by mouth 4 (four) times daily as needed for diarrhea or loose stools. 04/02/16   Vanetta Mulders, MD  meclizine (ANTIVERT) 25 MG tablet Take 25 mg by mouth 4 (four) times daily as needed for dizziness.    Historical Provider, MD  meloxicam (MOBIC) 7.5 MG tablet Take 1 tablet (7.5 mg total) by mouth daily. Patient not taking: Reported on 12/01/2015 11/30/15   [name], [name]  metFORMIN (GLUCOPHAGE) 500 MG tablet Take 500 mg by mouth 2 (two) times daily with a meal.    Historical Provider, MD  naproxen (NAPROSYN) 500 MG tablet Take 1 tablet (500 mg total) by mouth 2 (two) times daily. 04/02/16   Vanetta Mulders, MD  ondansetron (ZOFRAN ODT) 4 MG disintegrating tablet Take 1 tablet (4 mg total) by mouth every 8 (eight) hours as needed for nausea or vomiting. 04/02/16   Vanetta Mulders, MD  pravastatin (PRAVACHOL) 40 MG tablet Take 40 mg by mouth daily.    Historical Provider, MD  predniSONE (DELTASONE) 50 MG tablet Take 1 tablet (50 mg total) by mouth daily. Patient not taking: Reported on 12/01/2015 11/30/15   [name], [name]  sitaGLIPtin (JANUVIA) 100 MG tablet Take 100 mg by mouth daily.     Historical  Provider, MD  tiotropium (SPIRIVA) 18 MCG inhalation capsule Place 18 mcg into inhaler and inhale daily.    Historical Provider, MD  triamcinolone cream (KENALOG) 0.1 % Apply 1 application topically 2 (two) times daily.    Historical Provider, MD  valsartan (DIOVAN) 160 MG tablet Take 160 mg by mouth daily with breakfast.     Historical Provider, MD    Family History Family History  Problem Relation Age of Onset  . Lung cancer Father   . Breast cancer Sister     Social History Social History  Substance Use Topics  . Smoking status: Current Every Day Smoker    Packs/day: 0.50    Years: 20.00    Types: Cigarettes  . Smokeless tobacco: Never Used  . Alcohol use No     Allergies   Biaxin [clarithromycin]; Clarithromycin; Lisinopril; Sulfa antibiotics; Chlorthalidone; Daptomycin; Amoxicillin-pot clavulanate; Aspirin; Cholestyramine; Dilaudid [hydromorphone hcl]; Lasix [furosemide]; and Latex   Review of Systems Review of Systems  Constitutional: Positive for chills. Negative for fever.  HENT: Positive for congestion and sore throat.   Eyes: Negative for redness.  Respiratory: Positive for cough. Negative for shortness of breath.   Cardiovascular: Negative for chest pain.  Gastrointestinal: Positive for diarrhea, nausea and vomiting. Negative for abdominal pain.  Genitourinary: Negative for dysuria.  Musculoskeletal: Positive for myalgias.  Skin: Negative for rash.  Neurological: Negative for headaches.  Hematological: Does not bruise/bleed easily.  Psychiatric/Behavioral: Negative for confusion.     Physical Exam Updated Vital Signs BP 143/98 (BP Location: Left Arm)   Pulse 89   Temp 98.3 F (36.8 C) (Oral)   Resp 18   Ht 5\' 7"  (1.702 m)   Wt 71.2 kg   LMP 06/09/2011   SpO2 97%   BMI 24.59 kg/m   Physical Exam  Constitutional: She is oriented to person, place, and time. She appears well-developed and well-nourished. No distress.  HENT:  Head: Normocephalic  and atraumatic.  Mouth/Throat: Oropharynx is clear and moist. No oropharyngeal exudate.  Eyes: Conjunctivae and EOM are normal. Pupils are equal, round, and reactive to light.  Neck: Normal range of motion. Neck supple.  Cardiovascular: Normal rate, regular rhythm and normal heart sounds.   Pulmonary/Chest: Effort normal and breath sounds normal. No respiratory distress. She has no wheezes.  Abdominal: Soft. Bowel sounds are normal. There is no tenderness.  Neurological: She is alert and oriented to person, place, and time. No cranial nerve deficit. She exhibits normal muscle tone. Coordination normal.  Skin: Skin is warm. Capillary refill takes less than 2 seconds. No rash noted.  Nursing note and vitals reviewed.    ED Treatments / Results  Labs (all labs ordered are listed, but only abnormal results are displayed) Labs Reviewed  CBC WITH DIFFERENTIAL/PLATELET - Abnormal; Notable for the following:       Result Value   RBC 5.28 (*)    Hemoglobin 15.3 (*)    Platelets 133 (*)    All other components within normal limits  COMPREHENSIVE METABOLIC PANEL - Abnormal; Notable for the following:    Potassium 3.2 (*)    Glucose, Bld 169 (*)    BUN <5 (*)    All other components within normal limits  URINALYSIS, ROUTINE W REFLEX MICROSCOPIC ([facility]) - Abnormal; Notable for the following:    Glucose, UA 100 (*)    All other components within normal limits  I-STAT BETA HCG BLOOD, ED ([facility], [facility], [facility] ONLY)   Results for orders placed or performed during the hospital encounter of 04/01/16  CBC with Differential  Result Value Ref Range   WBC 8.5 4.0 - 10.5 K/uL   RBC 5.28 (H) 3.87 - 5.11 MIL/uL   Hemoglobin 15.3 (H) 12.0 - 15.0 g/dL   HCT 21.345.4 08.636.0 - 57.846.0 %   MCV 86.0 78.0 - 100.0 fL   MCH 29.0 26.0 - 34.0 pg   MCHC 33.7 30.0 - 36.0 g/dL   RDW 46.913.6 62.911.5 - 52.815.5 %   Platelets 133 (L) 150 - 400 K/uL   Neutrophils Relative %  61 %   Neutro Abs 5.1 1.7 - 7.7 K/uL   Lymphocytes Relative 30  %   Lymphs Abs 2.6 0.7 - 4.0 K/uL   Monocytes Relative 5 %   Monocytes Absolute 0.4 0.1 - 1.0 K/uL   Eosinophils Relative 4 %   Eosinophils Absolute 0.3 0.0 - 0.7 K/uL   Basophils Relative 0 %   Basophils Absolute 0.0 0.0 - 0.1 K/uL  Comprehensive metabolic panel  Result Value Ref Range   Sodium 138 135 - 145 mmol/L   Potassium 3.2 (L) 3.5 - 5.1 mmol/L   Chloride 103 101 - 111 mmol/L   CO2 25 22 - 32 mmol/L   Glucose, Bld 169 (H) 65 - 99 mg/dL   BUN <5 (L) 6 - 20 mg/dL   Creatinine, Ser 1.61 0.44 - 1.00 mg/dL   Calcium 9.4 8.9 - 09.6 mg/dL   Total Protein 7.2 6.5 - 8.1 g/dL   Albumin 3.9 3.5 - 5.0 g/dL   AST 26 15 - 41 U/L   ALT 37 14 - 54 U/L   Alkaline Phosphatase 112 38 - 126 U/L   Total Bilirubin 0.6 0.3 - 1.2 mg/dL   GFR calc non Af Amer >60 >60 mL/min   GFR calc Af Amer >60 >60 mL/min   Anion gap 10 5 - 15  Urinalysis, Routine w reflex microscopic (not at [facility])  Result Value Ref Range   Color, Urine YELLOW YELLOW   APPearance CLEAR CLEAR   Specific Gravity, Urine 1.015 1.005 - 1.030   pH 5.5 5.0 - 8.0   Glucose, UA 100 (A) NEGATIVE mg/dL   Hgb urine dipstick NEGATIVE NEGATIVE   Bilirubin Urine NEGATIVE NEGATIVE   Ketones, ur NEGATIVE NEGATIVE mg/dL   Protein, ur NEGATIVE NEGATIVE mg/dL   Nitrite NEGATIVE NEGATIVE   Leukocytes, UA NEGATIVE NEGATIVE  I-Stat Beta hCG blood, ED ([facility], [facility], [facility] only)  Result Value Ref Range   I-stat hCG, quantitative <5.0 <5 mIU/mL   Comment 3             EKG  EKG Interpretation None       Radiology Dg Chest 2 View  Result Date: 04/01/2016 CLINICAL DATA:  Cough, wheezing, shortness of breath, and fever for 1 day. History of COPD, diabetes, hypertension. Current smoker. EXAM: CHEST  2 VIEW COMPARISON:  10/25/2015 FINDINGS: The heart size and mediastinal contours are within normal limits. Both lungs are clear. The visualized skeletal structures are unremarkable. IMPRESSION: No active cardiopulmonary disease. Electronically Signed    By: Burman Nieves M.D.   On: 04/01/2016 22:09    Procedures Procedures (including critical care time)  Medications Ordered in ED Medications  ondansetron (ZOFRAN-ODT) disintegrating tablet 4 mg (not administered)     Initial Impression / Assessment and Plan / ED Course  I have reviewed the triage vital signs and the nursing notes.  Pertinent labs & imaging results that were available during my care of the patient were reviewed by me and considered in my medical decision making (see chart for details).  Clinical Course   Patient symptoms are consistent with a flulike illness. To include bodyaches cough congestion productive cough nausea vomiting and diarrhea. Patient nontoxic no acute distress. Chest x-rays negative for pneumonia. No leukocytosis. Electrolytes without significant abnormalities. Will treat symptomatically with Zofran and Imodium right ear Naprosyn and Mucinex DM. Precautions provided.  Final Clinical Impressions(s) / ED Diagnoses   Final diagnoses:  Flu-like symptoms    New Prescriptions New Prescriptions  DEXTROMETHORPHAN-GUAIFENESIN (MUCINEX DM) 30-600 MG 12HR TABLET    Take 1 tablet by mouth 2 (two) times daily.   LOPERAMIDE (IMODIUM A-D) 2 MG TABLET    Take 1 tablet (2 mg total) by mouth 4 (four) times daily as needed for diarrhea or loose stools.   NAPROXEN (NAPROSYN) 500 MG TABLET    Take 1 tablet (500 mg total) by mouth 2 (two) times daily.   ONDANSETRON (ZOFRAN ODT) 4 MG DISINTEGRATING TABLET    Take 1 tablet (4 mg total) by mouth every 8 (eight) hours as needed for nausea or vomiting.     Vanetta Mulders, MD 04/02/16 0028

## 2016-04-08 ENCOUNTER — Emergency Department (HOSPITAL_COMMUNITY)
Admission: EM | Admit: 2016-04-08 | Discharge: 2016-04-09 | Disposition: A | Discharge status: Home / Self Care | Payer: Self-pay | Attending: Emergency Medicine | Admitting: Emergency Medicine

## 2016-04-08 ENCOUNTER — Encounter (HOSPITAL_COMMUNITY): Payer: Self-pay | Admitting: Emergency Medicine

## 2016-04-08 ENCOUNTER — Emergency Department (HOSPITAL_COMMUNITY): Payer: Self-pay

## 2016-04-08 DIAGNOSIS — Z7982 Long term (current) use of aspirin: Secondary | ICD-10-CM | POA: Insufficient documentation

## 2016-04-08 DIAGNOSIS — J441 Chronic obstructive pulmonary disease with (acute) exacerbation: Secondary | ICD-10-CM | POA: Insufficient documentation

## 2016-04-08 DIAGNOSIS — F17210 Nicotine dependence, cigarettes, uncomplicated: Secondary | ICD-10-CM | POA: Insufficient documentation

## 2016-04-08 DIAGNOSIS — I110 Hypertensive heart disease with heart failure: Secondary | ICD-10-CM | POA: Insufficient documentation

## 2016-04-08 DIAGNOSIS — J40 Bronchitis, not specified as acute or chronic: Secondary | ICD-10-CM | POA: Insufficient documentation

## 2016-04-08 DIAGNOSIS — M654 Radial styloid tenosynovitis [de Quervain]: Secondary | ICD-10-CM | POA: Insufficient documentation

## 2016-04-08 DIAGNOSIS — Z7984 Long term (current) use of oral hypoglycemic drugs: Secondary | ICD-10-CM | POA: Insufficient documentation

## 2016-04-08 DIAGNOSIS — Z91040 Latex allergy status: Secondary | ICD-10-CM | POA: Insufficient documentation

## 2016-04-08 DIAGNOSIS — I5022 Chronic systolic (congestive) heart failure: Secondary | ICD-10-CM | POA: Insufficient documentation

## 2016-04-08 DIAGNOSIS — E119 Type 2 diabetes mellitus without complications: Secondary | ICD-10-CM | POA: Insufficient documentation

## 2016-04-08 LAB — CBC
HCT: 46.4 % — ABNORMAL HIGH (ref 36.0–46.0)
HEMOGLOBIN: 15.9 g/dL — AB (ref 12.0–15.0)
MCH: 29.3 pg (ref 26.0–34.0)
MCHC: 34.3 g/dL (ref 30.0–36.0)
MCV: 85.6 fL (ref 78.0–100.0)
PLATELETS: 161 10*3/uL (ref 150–400)
RBC: 5.42 MIL/uL — AB (ref 3.87–5.11)
RDW: 13.7 % (ref 11.5–15.5)
WBC: 8.5 10*3/uL (ref 4.0–10.5)

## 2016-04-08 LAB — BASIC METABOLIC PANEL
ANION GAP: 11 (ref 5–15)
BUN: 5 mg/dL — ABNORMAL LOW (ref 6–20)
CALCIUM: 9.2 mg/dL (ref 8.9–10.3)
CO2: 22 mmol/L (ref 22–32)
CREATININE: 0.55 mg/dL (ref 0.44–1.00)
Chloride: 102 mmol/L (ref 101–111)
Glucose, Bld: 182 mg/dL — ABNORMAL HIGH (ref 65–99)
Potassium: 3.5 mmol/L (ref 3.5–5.1)
SODIUM: 135 mmol/L (ref 135–145)

## 2016-04-08 LAB — I-STAT TROPONIN, ED: TROPONIN I, POC: 0 ng/mL (ref 0.00–0.08)

## 2016-04-08 NOTE — ED Triage Notes (Signed)
Pt reports dry cough with congestion with intermittent chills for the last week already seen once with no improvement in symptoms. PT states for the last week she has been having substernal chest pain with pain in back with coughing. Pt also states she has left arm pain that is tender to touch.

## 2016-04-08 NOTE — ED Provider Notes (Signed)
By signing my name below, I, [name], attest that this documentation has been prepared under the direction and in the presence of Ebony N Ward, DO . Electronically Signed: Majel [name], Scribe. 04/09/2016. 12:52 AM.  TIME SEEN: 12:09 AM  CHIEF COMPLAINT: Cough    HPI:   [name] is a 55 y.o. female with PMHx of COPD, HTN and DM, who presents to the Emergency Department complaining of gradually worsening, dry cough and congestion that began 2 weeks ago. Pt reports associated fever (TMAX 100), chills, substernal chest pain with coughing only, dizziness and sore throat secondary to her cough. Pt reports she was seen in the ED on 10/8 for similar symptoms and was told to take Mucinex with no relief. Pt reports hx of smoking but notes she has not smoked in 2 weeks. Patient has been wheezing. Does have an albuterol inhaler at home. Has not been on steroids recently or antibiotics.  Has also had recent vomiting and diarrhea. States she is prescribed antiemetics which have helped with her nausea and vomiting she has not had vomiting several days but continues to have diarrhea. No abdominal pain.  Pt also complains of left wrist pain and weakness that began 5 days ago in which she is "unable to form a fist." She notes her pain is localized over a vein in her wrist and "shoots" up her left arm with palpation. She denies recent injury to her wrist. She is right-hand dominant.   ROS: See HPI Constitutional: fever  Eyes: no drainage  ENT: congestion    Cardiovascular: chest pain with coughing Resp: no SOB  GI: no vomiting GU: no dysuria Integumentary: no rash  Allergy: no hives  Musculoskeletal: no leg swelling  Neurological: no slurred speech ROS otherwise negative  PAST MEDICAL HISTORY/PAST SURGICAL HISTORY:  Past Medical History:  Diagnosis Date  . Anxiety   . Arthritis   . Chronic systolic heart failure (HCC)    EF 20-25% ECHO 05/2015  . COPD (chronic obstructive pulmonary  disease) (HCC)   . Depression   . Diabetes mellitus without complication (HCC)    Type II  . Dysrhythmia    pt unsure of name of arrythmia - " my heart rate will drop all of a sudden" -no current treatment - atrial flutter  . Fibrillation, atrial (HCC)   . Gallstones   . GERD (gastroesophageal reflux disease)   . Headache(784.0)    migraines  . Hypertension   . Osteomyelitis (HCC)    R ankle  . Rash    arms    MEDICATIONS:  Prior to Admission medications   Medication Sig Start Date End Date Taking? Authorizing Provider  albuterol (PROVENTIL HFA;VENTOLIN HFA) 108 (90 BASE) MCG/ACT inhaler Inhale 2 puffs into the lungs every 4 (four) hours as needed for wheezing. Wheezing    Historical Provider, MD  ALPRAZolam [name]) 0.5 MG tablet Take 1 mg by mouth 2 (two) times daily. Anxiety    Historical Provider, MD  aspirin EC 81 MG tablet Take 1 tablet (81 mg total) by mouth daily. Patient not taking: Reported on 12/01/2015 07/25/15   [name], MD  bisoprolol (ZEBETA) 5 MG tablet Take 0.5 tablets (2.5 mg total) by mouth daily. Patient not taking: Reported on 12/01/2015 07/25/15   [name], MD  cyclobenzaprine (FLEXERIL) 10 MG tablet Take 1 tablet (10 mg total) by mouth at bedtime. Patient not taking: Reported on 12/01/2015 11/30/15   [name], [name]  cyclobenzaprine (FLEXERIL) 5  MG tablet Take 1 tablet (5 mg total) by mouth 3 (three) times daily as needed for muscle spasms. 11/13/15   [name], [name]  dextromethorphan-guaiFENesin [facility] DM) 30-600 MG 12hr tablet Take 1 tablet by mouth 2 (two) times daily. 04/02/16   Vanetta Mulders, MD  Fe Cbn-Fe Gluc-FA-B12-C-DSS (FERRALET 90) 90-1 MG TABS Take 1 tablet by mouth daily.    Historical Provider, MD  ferrous sulfate 325 (65 FE) MG EC tablet Take 1 tablet (325 mg total) by mouth daily with breakfast. 11/03/15   [name], MD  FLUoxetine (PROZAC) 40 MG capsule Take 40 mg by mouth at bedtime.     Historical Provider, MD   glimepiride (AMARYL) 4 MG tablet Take 1 tablet (4 mg total) by mouth daily with breakfast. 10/25/15   [name], MD  glucose blood (ACCU-CHEK AVIVA PLUS) test strip 1 strip by Does not apply route 4 (four) times daily. 08/12/15   Historical Provider, MD  hydrOXYzine (ATARAX/VISTARIL) 10 MG tablet Take 10 mg by mouth every 6 (six) hours as needed for itching. Reported on 12/01/2015    Historical Provider, MD  loperamide (IMODIUM A-D) 2 MG tablet Take 1 tablet (2 mg total) by mouth 4 (four) times daily as needed for diarrhea or loose stools. 04/02/16   Vanetta Mulders, MD  meclizine (ANTIVERT) 25 MG tablet Take 25 mg by mouth 4 (four) times daily as needed for dizziness.    Historical Provider, MD  meloxicam (MOBIC) 7.5 MG tablet Take 1 tablet (7.5 mg total) by mouth daily. Patient not taking: Reported on 12/01/2015 11/30/15   [name], [name]  metFORMIN (GLUCOPHAGE) 500 MG tablet Take 500 mg by mouth 2 (two) times daily with a meal.    Historical Provider, MD  naproxen (NAPROSYN) 500 MG tablet Take 1 tablet (500 mg total) by mouth 2 (two) times daily. 04/02/16   Vanetta Mulders, MD  ondansetron (ZOFRAN ODT) 4 MG disintegrating tablet Take 1 tablet (4 mg total) by mouth every 8 (eight) hours as needed for nausea or vomiting. 04/02/16   Vanetta Mulders, MD  pravastatin (PRAVACHOL) 40 MG tablet Take 40 mg by mouth daily.    Historical Provider, MD  predniSONE (DELTASONE) 50 MG tablet Take 1 tablet (50 mg total) by mouth daily. Patient not taking: Reported on 12/01/2015 11/30/15   [name], [name]  sitaGLIPtin (JANUVIA) 100 MG tablet Take 100 mg by mouth daily.     Historical Provider, MD  tiotropium (SPIRIVA) 18 MCG inhalation capsule Place 18 mcg into inhaler and inhale daily.    Historical Provider, MD  triamcinolone cream (KENALOG) 0.1 % Apply 1 application topically 2 (two) times daily.    Historical Provider, MD  valsartan (DIOVAN) 160 MG tablet Take 160 mg by mouth daily with breakfast.      Historical Provider, MD    ALLERGIES:  Allergies  Allergen Reactions  . Biaxin [Clarithromycin] Anaphylaxis and Swelling  . Clarithromycin Shortness Of Breath and Swelling  . Lisinopril Swelling and Cough    Lip edema  . Sulfa Antibiotics Anaphylaxis, Shortness Of Breath, Swelling and Hypertension  . Chlorthalidone Nausea And Vomiting and Other (See Comments)    Clammy, Tachycardia, Headache   . Daptomycin Nausea And Vomiting  . Amoxicillin-Pot Clavulanate Diarrhea    Has patient had a PCN reaction causing immediate rash, facial/tongue/throat swelling, SOB or lightheadedness with hypotension:No Has patient had a PCN reaction causing severe rash involving mucus membranes or skin necrosis:No Has patient had a PCN reaction  that required hospitalization:No Has patient had a PCN reaction occurring within the last 10 years:Yes If all of the above answers are "NO", then may proceed with Cephalosporin use.   . Aspirin Nausea And Vomiting and Rash  . Cholestyramine Nausea And Vomiting  . Dilaudid [Hydromorphone Hcl] Nausea And Vomiting  . Lasix [Furosemide] Nausea And Vomiting and Other (See Comments)    Headache   . Latex Hives and Rash    SOCIAL HISTORY:  Social History  Substance Use Topics  . Smoking status: Current Every Day Smoker    Packs/day: 0.50    Years: 20.00    Types: Cigarettes  . Smokeless tobacco: Never Used  . Alcohol use No    FAMILY HISTORY: Family History  Problem Relation Age of Onset  . Lung cancer Father   . Breast cancer Sister     EXAM: BP 129/86 (BP Location: Right Arm)   Pulse 88   Temp 98.4 F (36.9 C) (Oral)   Resp 24   Ht 5\' 7"  (1.702 m)   Wt 157 lb (71.2 kg)   LMP 06/09/2011   SpO2 100%   BMI 24.59 kg/m  CONSTITUTIONAL: Alert and oriented and responds appropriately to questions. Well-appearing; well-nourished HEAD: Normocephalic EYES: Conjunctivae clear, PERRL ENT: normal nose; no rhinorrhea; moist mucous membranes; No pharyngeal  erythema or petechiae, no tonsillar hypertrophy or exudate, no uvular deviation, no trismus or drooling, normal phonation, no stridor, no dental caries present, no drainable dental abscess noted, no Ludwig's angina, tongue sits flat in the bottom of the mouth, no angioedema, no facial erythema or warmth, no facial swelling NECK: Supple, no meningismus, no LAD  CARD: RRR; S1 and S2 appreciated; no murmurs, no clicks, no rubs, no gallops RESP: Normal chest excursion without splinting or tachypnea; breath sounds equal bilaterally; no rhonchi, no rales, no hypoxia or respiratory distress, speaking full sentences. Diffuse expiratory wheezing slightly diminished at bases bilaterally     ABD/GI: Normal bowel sounds; non-distended; soft, non-tender, no rebound, no guarding, no peritoneal signs BACK:  The back appears normal and is non-tender to palpation, there is no CVA tenderness EXT: TTP over the left radial wrist with +finklestein's test with no erythema, warmth, joint effusion, ecchymosis or swelling of the wrist. 2+ left radial pulse. Normal ROM in all joints; otherwise extremities are non-tender to palpation; no edema; normal capillary refill; no cyanosis, no calf tenderness or swelling SKIN: Normal color for age and race; warm; no rash NEURO: Moves all extremities equally, sensation to light touch intact diffusely, cranial nerves II through XII intact PSYCH: The patient's mood and manner are appropriate. Grooming and personal hygiene are appropriate.  MEDICAL DECISION MAKING: Patient here with bronchitis, COPD exacerbation. Given her history of tobacco use, continued symptoms and fever, will start her on azithromycin. She has taken azithromycin in the past without any problems. We'll also give her albuterol, Atrovent and prednisone as she is wheezing today. We'll treat her cough with guaifenesin with codeine. In triage are unremarkable including negative troponin, clear chest x-ray. I do not think that  her chest pain is secondary to ACS, PE or dissection. It seems to be musculoskeletal in nature secondary to her coughing.   As for patient's left wrist pain, will obtain an x-ray but I suspect that this is declared veins tenosynovitis. I recommended anti-inflammatories, ice and orthopedic follow-up. Will place patient in a splint.  ED PROGRESS: 3:00 AM  Patient's chest x-ray shows no acute abnormality. She reports feeling better. Lungs have  cleared but she is still having some wheezing. We'll give another albuterol, Atrovent treatment and reassess. Anticipate discharge home.   3:45 AM  Pt's lungs are almost completely clear. No hypoxia. Will discharge home with within a somewhat with codeine, azithromycin, albuterol inhaler, prednisone burst, ibuprofen for her wrist pain. We'll give outpatient orthopedic follow-up. She is currently in a Velcro thumb spica. Discussed return precautions. Recommended elevation of her wrist when at rest, ice. At this time, I do not feel there is any life-threatening condition present. I have reviewed and discussed all results (EKG, imaging, lab, urine as appropriate), exam findings with patient/family. I have reviewed nursing notes and appropriate previous records.  I feel the patient is safe to be discharged home without further emergent workup and can continue workup as an outpatient as needed. Discussed usual and customary return precautions. Patient/family verbalize understanding and are comfortable with this plan.  Outpatient follow-up has been provided. All questions have been answered.      EKG Interpretation  Date/Time:  Thursday April 08 2016 18:58:50 EDT Ventricular Rate:  75 PR Interval:  152 QRS Duration: 74 QT Interval:  390 QTC Calculation: 435 R Axis:   62 Text Interpretation:  Normal sinus rhythm ST & T wave abnormality, consider inferior ischemia ST & T wave abnormality, consider anterolateral ischemia Abnormal ECG LBBB has resolved compared to  prior Confirmed by WARD,  DO, Ebony [phone]) on 04/08/2016 11:48:51 PM       EKG Interpretation  Date/Time:  Friday April 09 2016 00:17:08 EDT Ventricular Rate:  77 PR Interval:  152 QRS Duration: 86 QT Interval:  373 QTC Calculation: 423 R Axis:   65 Text Interpretation:  Sinus rhythm Probable left atrial enlargement Nonspecific repol abnormality, diffuse leads No significant change since last tracing Confirmed by WARD,  DO, Ebony [phone]) on 04/09/2016 3:33:20 AM       I personally performed the services described in this documentation, which was scribed in my presence. The recorded information has been reviewed and is accurate.     Layla Maw Ward, DO 04/09/16 [phone]

## 2016-04-09 ENCOUNTER — Emergency Department (HOSPITAL_COMMUNITY): Payer: Self-pay

## 2016-04-09 MED ORDER — HYDROCODONE-ACETAMINOPHEN 5-325 MG PO TABS
2.0000 | ORAL_TABLET | Freq: Once | ORAL | Status: DC
Start: 2016-04-09 — End: 2016-04-09

## 2016-04-09 MED ORDER — GUAIFENESIN-CODEINE 100-10 MG/5ML PO SOLN: 5.0000 mL | Freq: Four times a day (QID) | ORAL | 0 refills | Status: DC | PRN

## 2016-04-09 MED ORDER — ONDANSETRON 4 MG PO TBDP
4.0000 mg | ORAL_TABLET | Freq: Once | ORAL | Status: AC
  Administered 2016-04-09: 4 mg via ORAL
  Filled 2016-04-09: qty 1

## 2016-04-09 MED ORDER — IBUPROFEN 800 MG PO TABS
800.0000 mg | ORAL_TABLET | Freq: Once | ORAL | Status: AC
  Administered 2016-04-09: 800 mg via ORAL
  Filled 2016-04-09: qty 1

## 2016-04-09 MED ORDER — AZITHROMYCIN 250 MG PO TABS: 250.0000 mg | ORAL_TABLET | Freq: Every day | ORAL | 0 refills | Status: DC

## 2016-04-09 MED ORDER — IPRATROPIUM BROMIDE 0.02 % IN SOLN
1.0000 mg | Freq: Once | RESPIRATORY_TRACT | Status: AC
  Administered 2016-04-09: 1 mg via RESPIRATORY_TRACT
  Filled 2016-04-09: qty 5

## 2016-04-09 MED ORDER — IBUPROFEN 800 MG PO TABS: 800.0000 mg | ORAL_TABLET | Freq: Three times a day (TID) | ORAL | 0 refills | Status: DC | PRN

## 2016-04-09 MED ORDER — ALBUTEROL (5 MG/ML) CONTINUOUS INHALATION SOLN
10.0000 mg/h | INHALATION_SOLUTION | RESPIRATORY_TRACT | Status: DC
  Filled 2016-04-09 (×2): qty 20

## 2016-04-09 MED ORDER — PREDNISONE 20 MG PO TABS: 60.0000 mg | ORAL_TABLET | Freq: Every day | ORAL | 0 refills | Status: DC

## 2016-04-09 MED ORDER — ACETAMINOPHEN 500 MG PO TABS
1000.0000 mg | ORAL_TABLET | Freq: Once | ORAL | Status: AC
  Administered 2016-04-09: 1000 mg via ORAL
  Filled 2016-04-09: qty 2

## 2016-04-09 MED ORDER — GUAIFENESIN-CODEINE 100-10 MG/5ML PO SOLN
10.0000 mL | Freq: Once | ORAL | Status: AC
  Administered 2016-04-09: 10 mL via ORAL
  Filled 2016-04-09: qty 10

## 2016-04-09 MED ORDER — ALBUTEROL SULFATE (2.5 MG/3ML) 0.083% IN NEBU
5.0000 mg | INHALATION_SOLUTION | Freq: Once | RESPIRATORY_TRACT | Status: AC
  Administered 2016-04-09: 5 mg via RESPIRATORY_TRACT
  Filled 2016-04-09: qty 6

## 2016-04-09 MED ORDER — PREDNISONE 20 MG PO TABS
60.0000 mg | ORAL_TABLET | Freq: Once | ORAL | Status: AC
  Administered 2016-04-09: 60 mg via ORAL
  Filled 2016-04-09: qty 3

## 2016-04-09 MED ORDER — ALBUTEROL SULFATE HFA 108 (90 BASE) MCG/ACT IN AERS
2.0000 | INHALATION_SPRAY | Freq: Once | RESPIRATORY_TRACT | Status: AC
  Administered 2016-04-09: 2 via RESPIRATORY_TRACT
  Filled 2016-04-09: qty 6.7

## 2016-04-09 MED ORDER — IPRATROPIUM BROMIDE 0.02 % IN SOLN
0.5000 mg | Freq: Once | RESPIRATORY_TRACT | Status: AC
  Administered 2016-04-09: 0.5 mg via RESPIRATORY_TRACT
  Filled 2016-04-09: qty 2.5

## 2016-04-09 MED ORDER — AZITHROMYCIN 250 MG PO TABS
500.0000 mg | ORAL_TABLET | Freq: Once | ORAL | Status: AC
  Administered 2016-04-09: 500 mg via ORAL
  Filled 2016-04-09: qty 2

## 2016-04-09 NOTE — ED Notes (Signed)
Pt transported to XR.  

## 2016-04-09 NOTE — Progress Notes (Signed)
Orthopedic Tech Progress Note Patient Details:  [name] January 05, 1961 [id number]  Ortho Devices Type of Ortho Device: Thumb velcro splint Ortho Device/Splint Location: lue Ortho Device/Splint Interventions: Ordered, Application   [name] 04/09/2016, 2:29 AM

## 2016-04-09 NOTE — ED Notes (Signed)
Paged ortho tech 

## 2016-04-09 NOTE — ED Notes (Signed)
Spoke to [facility].  Aware of pt's needs.  Currently at [facility]

## 2016-04-09 NOTE — ED Notes (Signed)
MD at bedside. 

## 2016-05-23 ENCOUNTER — Encounter (HOSPITAL_COMMUNITY): Payer: Self-pay

## 2016-05-23 ENCOUNTER — Emergency Department (HOSPITAL_COMMUNITY): Payer: Medicaid Other

## 2016-05-23 ENCOUNTER — Observation Stay (HOSPITAL_COMMUNITY)
Admission: EM | Admit: 2016-05-23 | Discharge: 2016-05-24 | Disposition: A | Discharge status: Home / Self Care | Payer: Medicaid Other | Attending: Oncology | Admitting: Oncology

## 2016-05-23 DIAGNOSIS — M65842 Other synovitis and tenosynovitis, left hand: Secondary | ICD-10-CM

## 2016-05-23 DIAGNOSIS — R0789 Other chest pain: Secondary | ICD-10-CM

## 2016-05-23 DIAGNOSIS — Z7984 Long term (current) use of oral hypoglycemic drugs: Secondary | ICD-10-CM | POA: Insufficient documentation

## 2016-05-23 DIAGNOSIS — Z8249 Family history of ischemic heart disease and other diseases of the circulatory system: Secondary | ICD-10-CM

## 2016-05-23 DIAGNOSIS — K219 Gastro-esophageal reflux disease without esophagitis: Secondary | ICD-10-CM | POA: Diagnosis present

## 2016-05-23 DIAGNOSIS — I10 Essential (primary) hypertension: Secondary | ICD-10-CM | POA: Diagnosis present

## 2016-05-23 DIAGNOSIS — E1165 Type 2 diabetes mellitus with hyperglycemia: Secondary | ICD-10-CM | POA: Insufficient documentation

## 2016-05-23 DIAGNOSIS — Z886 Allergy status to analgesic agent status: Secondary | ICD-10-CM

## 2016-05-23 DIAGNOSIS — I110 Hypertensive heart disease with heart failure: Secondary | ICD-10-CM | POA: Diagnosis not present

## 2016-05-23 DIAGNOSIS — E119 Type 2 diabetes mellitus without complications: Secondary | ICD-10-CM

## 2016-05-23 DIAGNOSIS — R079 Chest pain, unspecified: Principal | ICD-10-CM | POA: Diagnosis present

## 2016-05-23 DIAGNOSIS — F17210 Nicotine dependence, cigarettes, uncomplicated: Secondary | ICD-10-CM | POA: Insufficient documentation

## 2016-05-23 DIAGNOSIS — Z888 Allergy status to other drugs, medicaments and biological substances status: Secondary | ICD-10-CM

## 2016-05-23 DIAGNOSIS — I5022 Chronic systolic (congestive) heart failure: Secondary | ICD-10-CM | POA: Diagnosis not present

## 2016-05-23 DIAGNOSIS — J449 Chronic obstructive pulmonary disease, unspecified: Secondary | ICD-10-CM | POA: Diagnosis not present

## 2016-05-23 DIAGNOSIS — F418 Other specified anxiety disorders: Secondary | ICD-10-CM

## 2016-05-23 DIAGNOSIS — F329 Major depressive disorder, single episode, unspecified: Secondary | ICD-10-CM | POA: Diagnosis present

## 2016-05-23 DIAGNOSIS — I420 Dilated cardiomyopathy: Secondary | ICD-10-CM

## 2016-05-23 DIAGNOSIS — F419 Anxiety disorder, unspecified: Secondary | ICD-10-CM | POA: Diagnosis present

## 2016-05-23 DIAGNOSIS — Z882 Allergy status to sulfonamides status: Secondary | ICD-10-CM

## 2016-05-23 DIAGNOSIS — Z91040 Latex allergy status: Secondary | ICD-10-CM | POA: Diagnosis not present

## 2016-05-23 DIAGNOSIS — Z881 Allergy status to other antibiotic agents status: Secondary | ICD-10-CM

## 2016-05-23 DIAGNOSIS — Z9114 Patient's other noncompliance with medication regimen: Secondary | ICD-10-CM

## 2016-05-23 DIAGNOSIS — M25532 Pain in left wrist: Secondary | ICD-10-CM | POA: Insufficient documentation

## 2016-05-23 DIAGNOSIS — Z720 Tobacco use: Secondary | ICD-10-CM | POA: Diagnosis present

## 2016-05-23 DIAGNOSIS — Z801 Family history of malignant neoplasm of trachea, bronchus and lung: Secondary | ICD-10-CM

## 2016-05-23 DIAGNOSIS — Z79899 Other long term (current) drug therapy: Secondary | ICD-10-CM | POA: Diagnosis not present

## 2016-05-23 DIAGNOSIS — Z596 Low income: Secondary | ICD-10-CM

## 2016-05-23 DIAGNOSIS — I5042 Chronic combined systolic (congestive) and diastolic (congestive) heart failure: Secondary | ICD-10-CM

## 2016-05-23 DIAGNOSIS — Z803 Family history of malignant neoplasm of breast: Secondary | ICD-10-CM

## 2016-05-23 DIAGNOSIS — R739 Hyperglycemia, unspecified: Secondary | ICD-10-CM

## 2016-05-23 DIAGNOSIS — F32A Depression, unspecified: Secondary | ICD-10-CM | POA: Diagnosis present

## 2016-05-23 DIAGNOSIS — R195 Other fecal abnormalities: Secondary | ICD-10-CM

## 2016-05-23 DIAGNOSIS — N649 Disorder of breast, unspecified: Secondary | ICD-10-CM

## 2016-05-23 LAB — BASIC METABOLIC PANEL
ANION GAP: 10 (ref 5–15)
BUN: <5 mg/dL — ABNORMAL LOW (ref 6–20)
CHLORIDE: 100 mmol/L — AB (ref 101–111)
CO2: 23 mmol/L (ref 22–32)
Calcium: 9 mg/dL (ref 8.9–10.3)
Creatinine, Ser: 0.6 mg/dL (ref 0.44–1.00)
GFR calc Af Amer: >60 mL/min (ref >60)
GLUCOSE: 449 mg/dL — AB (ref 65–99)
POTASSIUM: 3.5 mmol/L (ref 3.5–5.1)
Sodium: 133 mmol/L — ABNORMAL LOW (ref 135–145)

## 2016-05-23 LAB — I-STAT TROPONIN, ED: Troponin i, poc: 0 ng/mL (ref 0.00–0.08)

## 2016-05-23 LAB — CBC
HEMATOCRIT: 43.3 % (ref 36.0–46.0)
HEMOGLOBIN: 15.1 g/dL — AB (ref 12.0–15.0)
MCH: 30 pg (ref 26.0–34.0)
MCHC: 34.9 g/dL (ref 30.0–36.0)
MCV: 86.1 fL (ref 78.0–100.0)
Platelets: 125 10*3/uL — ABNORMAL LOW (ref 150–400)
RBC: 5.03 MIL/uL (ref 3.87–5.11)
RDW: 12.6 % (ref 11.5–15.5)
WBC: 9.5 10*3/uL (ref 4.0–10.5)

## 2016-05-23 LAB — TROPONIN I: Troponin I: <0.03 ng/mL (ref <0.03)

## 2016-05-23 LAB — GLUCOSE, CAPILLARY: Glucose-Capillary: 326 mg/dL — ABNORMAL HIGH (ref 65–99)

## 2016-05-23 MED ORDER — NITROGLYCERIN 2 % TD OINT
1.0000 [in_us] | TOPICAL_OINTMENT | Freq: Once | TRANSDERMAL | Status: AC
  Administered 2016-05-23: 1 [in_us] via TOPICAL
  Filled 2016-05-23: qty 1

## 2016-05-23 MED ORDER — DICLOFENAC SODIUM 1 % TD GEL
2.0000 g | Freq: Four times a day (QID) | TRANSDERMAL | Status: DC
  Administered 2016-05-24 (×2): 2 g via TOPICAL
  Filled 2016-05-23 (×2): qty 100

## 2016-05-23 MED ORDER — OXYCODONE-ACETAMINOPHEN 5-325 MG PO TABS
1.0000 | ORAL_TABLET | Freq: Three times a day (TID) | ORAL | Status: DC | PRN
  Administered 2016-05-23 – 2016-05-24 (×2): 1 via ORAL
  Filled 2016-05-23 (×2): qty 1

## 2016-05-23 MED ORDER — ASPIRIN EC 81 MG PO TBEC
81.0000 mg | DELAYED_RELEASE_TABLET | Freq: Every day | ORAL | Status: DC
  Administered 2016-05-24: 81 mg via ORAL
  Filled 2016-05-23: qty 1

## 2016-05-23 MED ORDER — FLUOXETINE HCL 20 MG PO CAPS
40.0000 mg | ORAL_CAPSULE | Freq: Every day | ORAL | Status: DC
  Administered 2016-05-23: 40 mg via ORAL
  Filled 2016-05-23: qty 2

## 2016-05-23 MED ORDER — NITROGLYCERIN 0.4 MG SL SUBL
0.4000 mg | SUBLINGUAL_TABLET | SUBLINGUAL | Status: DC | PRN
  Administered 2016-05-23: 0.4 mg via SUBLINGUAL

## 2016-05-23 MED ORDER — ACETAMINOPHEN 650 MG RE SUPP: 650.0000 mg | Freq: Four times a day (QID) | RECTAL | Status: DC | PRN

## 2016-05-23 MED ORDER — SODIUM CHLORIDE 0.9 % IV BOLUS (SEPSIS)
1000.0000 mL | Freq: Once | INTRAVENOUS | Status: AC
  Administered 2016-05-23: 1000 mL via INTRAVENOUS

## 2016-05-23 MED ORDER — ENOXAPARIN SODIUM 40 MG/0.4ML ~~LOC~~ SOLN
40.0000 mg | SUBCUTANEOUS | Status: DC
  Administered 2016-05-23: 40 mg via SUBCUTANEOUS
  Filled 2016-05-23: qty 0.4

## 2016-05-23 MED ORDER — IPRATROPIUM-ALBUTEROL 0.5-2.5 (3) MG/3ML IN SOLN
3.0000 mL | Freq: Four times a day (QID) | RESPIRATORY_TRACT | Status: DC
  Administered 2016-05-23 – 2016-05-24 (×3): 3 mL via RESPIRATORY_TRACT
  Filled 2016-05-23 (×3): qty 3

## 2016-05-23 MED ORDER — INSULIN ASPART 100 UNIT/ML ~~LOC~~ SOLN
0.0000 [IU] | Freq: Every day | SUBCUTANEOUS | Status: DC
  Administered 2016-05-23: 5 [IU] via SUBCUTANEOUS

## 2016-05-23 MED ORDER — IPRATROPIUM-ALBUTEROL 0.5-2.5 (3) MG/3ML IN SOLN: 3.0000 mL | Freq: Once | RESPIRATORY_TRACT | Status: DC

## 2016-05-23 MED ORDER — BISOPROLOL FUMARATE 5 MG PO TABS
2.5000 mg | ORAL_TABLET | Freq: Every day | ORAL | Status: DC
  Administered 2016-05-24: 2.5 mg via ORAL
  Filled 2016-05-23: qty 1

## 2016-05-23 MED ORDER — INFLUENZA VAC SPLIT QUAD 0.5 ML IM SUSY: 0.5000 mL | PREFILLED_SYRINGE | INTRAMUSCULAR | Status: DC

## 2016-05-23 MED ORDER — LIDOCAINE 5 % EX PTCH
1.0000 | MEDICATED_PATCH | CUTANEOUS | Status: DC
  Administered 2016-05-23: 1 via TRANSDERMAL
  Filled 2016-05-23: qty 1

## 2016-05-23 MED ORDER — PRAVASTATIN SODIUM 40 MG PO TABS
40.0000 mg | ORAL_TABLET | Freq: Every day | ORAL | Status: DC
  Administered 2016-05-24: 40 mg via ORAL
  Filled 2016-05-23: qty 1

## 2016-05-23 MED ORDER — GABAPENTIN 100 MG PO CAPS
200.0000 mg | ORAL_CAPSULE | Freq: Once | ORAL | Status: AC
  Administered 2016-05-23: 200 mg via ORAL
  Filled 2016-05-23: qty 2

## 2016-05-23 MED ORDER — IRBESARTAN 150 MG PO TABS
150.0000 mg | ORAL_TABLET | Freq: Every day | ORAL | Status: DC
  Administered 2016-05-24: 150 mg via ORAL
  Filled 2016-05-23: qty 1

## 2016-05-23 MED ORDER — INSULIN ASPART 100 UNIT/ML ~~LOC~~ SOLN
0.0000 [IU] | Freq: Three times a day (TID) | SUBCUTANEOUS | Status: DC
  Administered 2016-05-24 (×2): 8 [IU] via SUBCUTANEOUS
  Administered 2016-05-24: 5 [IU] via SUBCUTANEOUS

## 2016-05-23 MED ORDER — ALPRAZOLAM 0.5 MG PO TABS
1.0000 mg | ORAL_TABLET | Freq: Two times a day (BID) | ORAL | Status: DC | PRN
  Administered 2016-05-23 – 2016-05-24 (×2): 1 mg via ORAL
  Filled 2016-05-23 (×2): qty 2

## 2016-05-23 MED ORDER — ALBUTEROL SULFATE (2.5 MG/3ML) 0.083% IN NEBU: 2.5000 mg | INHALATION_SOLUTION | RESPIRATORY_TRACT | Status: DC | PRN

## 2016-05-23 MED ORDER — PANTOPRAZOLE SODIUM 40 MG PO TBEC
40.0000 mg | DELAYED_RELEASE_TABLET | Freq: Every day | ORAL | Status: DC
  Administered 2016-05-24: 40 mg via ORAL
  Filled 2016-05-23: qty 1

## 2016-05-23 MED ORDER — ONDANSETRON HCL 4 MG/2ML IJ SOLN
4.0000 mg | Freq: Once | INTRAMUSCULAR | Status: AC
  Administered 2016-05-23: 4 mg via INTRAVENOUS
  Filled 2016-05-23: qty 2

## 2016-05-23 MED ORDER — SODIUM CHLORIDE 0.9% FLUSH
3.0000 mL | Freq: Two times a day (BID) | INTRAVENOUS | Status: DC
  Administered 2016-05-23 – 2016-05-24 (×2): 3 mL via INTRAVENOUS

## 2016-05-23 MED ORDER — ASPIRIN 81 MG PO CHEW
324.0000 mg | CHEWABLE_TABLET | Freq: Once | ORAL | Status: AC
  Administered 2016-05-23: 324 mg via ORAL
  Filled 2016-05-23: qty 4

## 2016-05-23 MED ORDER — ACETAMINOPHEN 325 MG PO TABS: 650.0000 mg | ORAL_TABLET | Freq: Four times a day (QID) | ORAL | Status: DC | PRN

## 2016-05-23 NOTE — ED Notes (Signed)
Patient transported to X-ray 

## 2016-05-23 NOTE — H&P (Signed)
Date: 05/23/2016               Patient Name:  [name] MRN: [id number]  DOB: August 22, 1960 Age / Sex: 55 y.o., female   PCP: L.Lupe Carney, MD         Medical Service: Internal Medicine Teaching Service         Attending Physician: Dr. Levert Feinstein, MD    First Contact: Dr. Nyra Market, MD Pager: [phone]  Second Contact: Dr. Deneise Lever, MD Pager: [phone]       After Hours (After 5p/  First Contact Pager: [phone]  weekends / holidays): Second Contact Pager: [phone]   Chief Complaint: chest pressure  History of Present Illness:  This is a 55 y/o F with Mhx significant for HTN, Afib, DM2, CHF, COPD and tobacco abuse Who presents for evaluation of chest pressure. Described it as tightness which wraps around to her back. This has been present x11 months however has worsened acutely over the past 3-4 days. Also has endorsed worsening SOB as well. Reports she has been using her medications sparingly over the past 3 weeks, about every-other day, as she lost her insurance and was unable to afford seeing her PCP or filling medications. In process of applying for orange card. She also has numerous unrelated complaints which she has not reported to her PCP including left wrist/thumb pain for which she has been referred to ortho by the ED (has not been able to go - no insurance), and left-sided body swelling which is present when she awakens and dissipates shortly after moving around. She also reports intermittent left lower quadrant/pelvic pain x 1 month, similar to "menstruation pains". She also reportedly told the ED provider she has chronic diarrhea however has had dark bowel movements x3 days. Denies any fevers, chills, cough, abdominal pain, constipation. Nausea, vomiting, changes in vision or slurred speech.   In the ED VSS (Temp 98.3*, BP 125/82, pulse 75, respirations 15 and shew as saturating 96% on RA). CXR normal. Troponin, BMET and CBC wnl. EKG unchanged from several  priors.  She was given ASA 324 mg, a 1-time dose of Gabapentin 200mg , Nitro patch and a duoneb treatment with improvement of her chest pressure from 8/10 -> 4/10. She was subsequently admitted for evaluation of her chest pain.   Meds:  Current Meds  Medication Sig  . acetaminophen (TYLENOL) 500 MG tablet Take 1,000 mg by mouth daily as needed (pain).  Marland Kitchen albuterol (PROVENTIL HFA;VENTOLIN HFA) 108 (90 BASE) MCG/ACT inhaler Inhale 2 puffs into the lungs 2 (two) times daily. Wheezing  . ALPRAZolam (XANAX) 0.5 MG tablet Take 1 mg by mouth 2 (two) times daily. Anxiety  . bisoprolol (ZEBETA) 5 MG tablet Take 2.5 mg by mouth daily.  Marland Kitchen esomeprazole (NEXIUM) 20 MG capsule Take 20 mg by mouth daily.   Marland Kitchen FLUoxetine (PROZAC) 40 MG capsule Take 40 mg by mouth at bedtime.   Marland Kitchen glimepiride (AMARYL) 4 MG tablet Take 1 tablet (4 mg total) by mouth daily with breakfast.  . hydrOXYzine (ATARAX/VISTARIL) 10 MG tablet Take 20 mg by mouth daily as needed for itching. Reported on 12/01/2015  . loperamide (IMODIUM A-D) 2 MG tablet Take 1 tablet (2 mg total) by mouth 4 (four) times daily as needed for diarrhea or loose stools. (Patient taking differently: Take 4 mg by mouth 3 (three) times daily. )  . meclizine (ANTIVERT) 25 MG tablet Take 25 mg by mouth 4 (four) times daily as  needed for dizziness.  . metFORMIN (GLUCOPHAGE) 500 MG tablet Take 500 mg by mouth daily with breakfast.   . oxyCODONE-acetaminophen (PERCOCET/ROXICET) 5-325 MG tablet Take 1 tablet by mouth 3 (three) times daily.  . pravastatin (PRAVACHOL) 40 MG tablet Take 40 mg by mouth daily.  Marland Kitchen. tiotropium (SPIRIVA) 18 MCG inhalation capsule Place 18 mcg into inhaler and inhale daily.  . valsartan (DIOVAN) 160 MG tablet Take 160 mg by mouth daily with breakfast.    Allergies: Allergies as of 05/23/2016 - Review Complete 05/23/2016  Allergen Reaction Noted  . Biaxin [clarithromycin] Anaphylaxis and Swelling 04/08/2013  . Clarithromycin Shortness Of Breath and  Swelling 09/05/2011  . Lisinopril Swelling and Cough 09/05/2011  . Sulfa antibiotics Anaphylaxis, Shortness Of Breath, Swelling, and Hypertension 09/05/2011  . Chlorthalidone Nausea And Vomiting and Other (See Comments) 10/08/2011  . Daptomycin Nausea And Vomiting 08/07/2015  . Amoxicillin-pot clavulanate Diarrhea 09/05/2011  . Aspirin Nausea And Vomiting and Rash 09/05/2011  . Cholestyramine Nausea And Vomiting 04/10/2015  . Dilaudid [hydromorphone hcl] Nausea And Vomiting 09/05/2011  . Lasix [furosemide] Nausea And Vomiting and Other (See Comments) 10/08/2011  . Latex Hives and Rash 09/05/2011   Past Medical History:  Diagnosis Date  . Anxiety   . Arthritis   . Chronic systolic heart failure (HCC)    EF 20-25% ECHO 05/2015  . COPD (chronic obstructive pulmonary disease) (HCC)   . Depression   . Diabetes mellitus without complication (HCC)    Type II  . Dysrhythmia    pt unsure of name of arrythmia - " my heart rate will drop all of a sudden" -no current treatment - atrial flutter  . Fibrillation, atrial (HCC)   . Gallstones   . GERD (gastroesophageal reflux disease)   . Headache(784.0)    migraines  . Hypertension   . Osteomyelitis (HCC)    R ankle  . Rash    arms   Family History:  Father: MI at age 849 requiring CABG x4, Lung cancer Sister: Breast cancer survivor  Social History: Current tobacco abuse, 1/2 pk-day. Has been smoking x30 years. Denied any alcohol or recreational drug use. Does not work. Lives with fiance who appears supportive.   Review of Systems: A complete ROS was negative except as per HPI.   Physical Exam: Blood pressure 117/90, pulse 69, temperature 98.3 F (36.8 C), temperature source Oral, resp. rate 17, last menstrual period 06/09/2011, SpO2 96 %. General: Very pleasant middle-aged Caucasian woman resting comfortably in bed. In no acute distress. Fiance present at bedside and appears supportive.  HENT: PERRL. EOMI. No scleral icterus or  injection. Oropharynx clear, mucous membranes moist. Cardiovascular: Regular rate and rhythm. No murmur or rub appreciated. Distal pulses intact.  Pulmonary: Mild scattered wheezing, otherwise CTA BL. No crackles. Non-labored.  Abdomen: Soft, non-tender and non-distended. No guarding or rigidity. +Bowel sounds Extremities: No peripheral edema BL upper or lower extremities. Left wrist split present initially. On removal, underlying skin without vesicles.  Skin: Warm, dry, not diaphoretic. No jaundice or cyanosis.  Neuro: Strength grossly intact, some grip weakness left hand. Sensation grossly intact.  Psych: Pleasant. Normal mood and affect. Responds to questions appropriately.   EKG: NSR rate 72. Unchanged from priors.   CXR: No acute cardiopulmonary process.   Left heart cath 05/2015: widely patent coronary arteries. Severe global left ventricular dysfunction, EF <20%. Recommendation: Medical therapy, consider resynchronization therapy if no improvement in LV function  ECHO 05/2015: LVEF 25-30% with diffuse wall hypokinesis. Grade 1 diastolic  dysfunction.   Assessment & Plan by Problem: This is a 55 y/o F with Hx of combined s/d CHF, tobacco abuse, HTN, HLD, COPD, DM2 and Fhx of MI and recent medication non-compliance who presents for evaluation of a 10 month hx of chest pressure with acute worsening over the past 3 days. Left-sided chest pressure with radiation around to back. Exacerbated by laying down, partially relieved by nitro. Also reports numerous chronic complaints.   Active Problems:   Hypertension   Anxiety   GERD (gastroesophageal reflux disease)   COPD (chronic obstructive pulmonary disease) (HCC)   Depression   Diabetes mellitus without complication (HCC)   Chest pain   Chronic systolic CHF (congestive heart failure) (HCC)   Hyperglycemia without ketosis   Tobacco abuse  Chest Pressure Atypical chest pressure however she does have multiple ACS RF's including HTN, DM,  Fhx, and tobacco abuse. EKG unchanged from priors, CXR normal and initial troponin negative x1. She had a left heart cath 05/2015 which showed widely-patient coronary arteries however did show severe global left ventricular dysfunction. ECHO from that time confirmed this finding: EF 25-30% with diffuse wall hypokinesis + grade 1 diastolic dysfunction. She was to f/u with cardiology as an outpatient however has not done so.  -Trending troponins, neg x2 -Repeat EKG in AM essentially unchanged -Lipid panel showed total cholesterol of 125, TG 585 and HDL 22 -Nitro prn -ASA 81 mg daily -Cardiac monitoring  Diabetes Intermittent compliance with home medications x 3 weeks, presently hyperglycemic at 449 however no anion gap + normal bicarb. On Glimepiride 4mg  and Metformin 500mg  daily.  -HbA1c -ISS  Combined Systolic and Diastolic Congestive Heart Failure Confirmed on echo 2016, was to be followed up as an outpatient however does not appear to have done so. She denies taking any Lasix or related fluid-pill and does not appear volume overloaded on examination. She does report a several day history of left face, left upper and lower extremity swelling in the morning after waking. This quickly dissipates after moving.  -ECHO -Strict I&O, daily weights  HTN Controlled. Takes Bisoprolol + Irbesartan at home -Meds continued  Left thumb tenosynovitis Has already been refered to Ortho as an outpatient however unable to afford apt without insurance.  -Spica thumb split + Voltaren gel  ?Atrial Fibrillation Reported she had an abnormal rhythm while wearing a monitor and believes its Atrial Fibrillation. Reports she does not take any anticoagulation or ASA however takes Bisoprolol. Unable to find overt diagnosis or confirmation of AFib. She has seen cardiology, most recently 2016, who recommended she stop smoking.  -Continue Bisoprolol -ASA 81 mg  COPD, Tobaco Abuse Out of inhalers x 3 weeks, mild  wheezing on examination. -Duonebs -hold home Spiriva, resume on d/c -Cessation counseling  Health Maintence  -Flu shot -Will need consult for care management, pt needs help obtaining medications and insurance -Hep C, HIV abs -Will need mammogram as an outpatient - endorsed recent left breast swelling x 1 month (no skin or nipple changes, no pain, no erythema and no d/c).   Anxiety and Depression Has been on Prozac and Xanax for many years however out of Prozac x3 weeks.  -Restart Prozac 40 mg daily -Xanax 1mg  BID PRN anxiety  Dark Stools Informed ED provider of 3 day history of dark stools. FOBT was attempted in ED however no stool in rectal vault. H&H without anemia. Has been evaluated by GI for BRB per rectum earlier this year - felt syx due to hemorrhoids. Had normal  colonoscopy 3 years ago.  -FOBT, Positive in 10/2015  Diet: HH IVF: NSL Code Status: FULL DVT Prophylaxis: Lovenox  Dispo: Admit patient to Observation with expected length of stay less than 2 midnights.  SignedNoemi Chapel, DO 05/23/2016, 8:09 PM  Pager: [phone]

## 2016-05-23 NOTE — ED Provider Notes (Signed)
[facility]-EMERGENCY DEPT Provider Note   CSN: [id number] Arrival date & time: 05/23/16  1512     History   Chief Complaint Chief Complaint  Patient presents with  . Chest Pain    HPI   Blood pressure 135/100, pulse 73, temperature 98.3 F (36.8 C), temperature source Oral, resp. rate 18, last menstrual period 06/09/2011, SpO2 98 %.  [name] is a 55 y.o. female with past medical history significant for tobacco use, hyperlipidemia, diabetes, CHF, A. fib (not anticoagulated) left-sided chest pain radiating around to the back described as pressure-like onset 3-4 days ago intermittent and exacerbated by laying supine and unclear if it's exertional or positional, states it worsened significantly today, it's been constant for the last hour. She states that she feels that her heart beats very slow and she feels lightheaded like she is going to pass out. Pain is 8 out of 10, no aspirin prior to arrival. Father had a massive heart attack at age 26, he had quadruple bypass, but he survived. Patient states she has no structural abnormalities with her heart. She's never been evaluated by cardiology.  Patient reports chronic diarrhea which she noticed turning black 3 days ago, she takes Imodium chronically, no Pepto-Bismol, NSAID use or liver dysfunction or alcohol use.  Patient would also like her left wrist pain evaluated, states on the radial aspect from the metacarpal into the mid arm it's very sensitive. She states that she takes Percocet for her chronic low back pain but that's not alleviating the wrist pain. There is been no trauma, patient is right-hand-dominant, no repetitive use.  No primary care, she is in the process of applying for the orange card.  Past Medical History:  Diagnosis Date  . Anxiety   . Arthritis   . Chronic systolic heart failure (HCC)    EF 20-25% ECHO 05/2015  . COPD (chronic obstructive pulmonary disease) (HCC)   . Depression   . Diabetes mellitus without  complication (HCC)    Type II  . Dysrhythmia    pt unsure of name of arrythmia - " my heart rate will drop all of a sudden" -no current treatment - atrial flutter  . Fibrillation, atrial (HCC)   . Gallstones   . GERD (gastroesophageal reflux disease)   . Headache(784.0)    migraines  . Hypertension   . Osteomyelitis (HCC)    R ankle  . Rash    arms    Patient Active Problem List   Diagnosis Date Noted  . Bleeding hemorrhoids 11/03/2015  . Acute blood loss anemia 11/03/2015  . Hematochezia 11/01/2015  . Back pain with sciatica 09/08/2015  . Infection of bone of ankle (HCC) 07/15/2015  . Chronic systolic CHF (congestive heart failure) (HCC) 06/24/2015  . Chest pain 06/03/2015  . Hypertension   . Anxiety   . Gallstones   . GERD (gastroesophageal reflux disease)   . COPD (chronic obstructive pulmonary disease) (HCC)   . Dysrhythmia   . Rash   . Arthritis   . Depression   . Diabetes mellitus without complication (HCC)   . Post-operative state 12/10/2011    Past Surgical History:  Procedure Laterality Date  . ANKLE FRACTURE SURGERY Right 2015  . CARDIAC CATHETERIZATION N/A 06/24/2015   Procedure: Left Heart Cath and Coronary Angiography;  Surgeon: [name], MD;  Location: [facility] INVASIVE CV LAB;  Service: Cardiovascular;  Laterality: N/A;  . CARPAL TUNNEL RELEASE     bil  . CHOLECYSTECTOMY  11/24/2011  Procedure: LAPAROSCOPIC CHOLECYSTECTOMY WITH INTRAOPERATIVE CHOLANGIOGRAM;  Surgeon: Clovis Puhomas A. Cornett, MD;  Location: [facility] ORS;  Service: General;  Laterality: N/A;  Laparoscopic Cholecystectomy with Cholangiogram  . ECTOPIC PREGNANCY SURGERY  many yrs ago  . HARDWARE REMOVAL Right 07/15/2015   Procedure: RIGHT ANKLE HARDWARE REMOVAL, PLACEMENT OF STIMULAN ANTIBIOTIC BEADS AND PREVENA WOUND VAC.;  Surgeon: Cammy CopaScott Gregory Dean, MD;  Location: [facility] OR;  Service: Orthopedics;  Laterality: Right;    OB History    Gravida Para Term Preterm AB Living   9       5 4    SAB TAB  Ectopic Multiple Live Births   4   1   4        Home Medications    Prior to Admission medications   Medication Sig Start Date End Date Taking? Authorizing Provider  acetaminophen (TYLENOL) 500 MG tablet Take 1,000 mg by mouth daily as needed (pain).   Yes Historical Provider, MD  albuterol (PROVENTIL HFA;VENTOLIN HFA) 108 (90 BASE) MCG/ACT inhaler Inhale 2 puffs into the lungs 2 (two) times daily. Wheezing   Yes Historical Provider, MD  ALPRAZolam (XANAX) 0.5 MG tablet Take 1 mg by mouth 2 (two) times daily. Anxiety   Yes Historical Provider, MD  bisoprolol (ZEBETA) 5 MG tablet Take 2.5 mg by mouth daily. 02/20/16  Yes Historical Provider, MD  esomeprazole (NEXIUM) 20 MG capsule Take 20 mg by mouth daily.    Yes Historical Provider, MD  FLUoxetine (PROZAC) 40 MG capsule Take 40 mg by mouth at bedtime.    Yes Historical Provider, MD  glimepiride (AMARYL) 4 MG tablet Take 1 tablet (4 mg total) by mouth daily with breakfast. 10/25/15  Yes Zadie Rhineonald Wickline, MD  hydrOXYzine (ATARAX/VISTARIL) 10 MG tablet Take 20 mg by mouth daily as needed for itching. Reported on 12/01/2015   Yes Historical Provider, MD  loperamide (IMODIUM A-D) 2 MG tablet Take 1 tablet (2 mg total) by mouth 4 (four) times daily as needed for diarrhea or loose stools. Patient taking differently: Take 4 mg by mouth 3 (three) times daily.  04/02/16  Yes Vanetta MuldersScott Zackowski, MD  meclizine (ANTIVERT) 25 MG tablet Take 25 mg by mouth 4 (four) times daily as needed for dizziness.   Yes Historical Provider, MD  metFORMIN (GLUCOPHAGE) 500 MG tablet Take 500 mg by mouth daily with breakfast.    Yes Historical Provider, MD  oxyCODONE-acetaminophen (PERCOCET/ROXICET) 5-325 MG tablet Take 1 tablet by mouth 3 (three) times daily.   Yes Historical Provider, MD  pravastatin (PRAVACHOL) 40 MG tablet Take 40 mg by mouth daily.   Yes Historical Provider, MD  tiotropium (SPIRIVA) 18 MCG inhalation capsule Place 18 mcg into inhaler and inhale daily.   Yes  Historical Provider, MD  valsartan (DIOVAN) 160 MG tablet Take 160 mg by mouth daily with breakfast.    Yes Historical Provider, MD  dextromethorphan-guaiFENesin (MUCINEX DM) 30-600 MG 12hr tablet Take 1 tablet by mouth 2 (two) times daily. Patient not taking: Reported on 05/23/2016 04/02/16   Vanetta MuldersScott Zackowski, MD  ferrous sulfate 325 (65 FE) MG EC tablet Take 1 tablet (325 mg total) by mouth daily with breakfast. Patient not taking: Reported on 05/23/2016 11/03/15   Renae FickleMackenzie Short, MD  glucose blood (ACCU-CHEK AVIVA PLUS) test strip 1 strip by Does not apply route 4 (four) times daily. 08/12/15   Historical Provider, MD  guaiFENesin-codeine 100-10 MG/5ML syrup Take 5-10 mLs by mouth every 6 (six) hours as needed for cough. Patient not taking: Reported on  05/23/2016 04/09/16   [name], DO  ibuprofen (ADVIL,MOTRIN) 800 MG tablet Take 1 tablet (800 mg total) by mouth every 8 (eight) hours as needed for mild pain. Patient not taking: Reported on 05/23/2016 04/09/16   [name], DO  ondansetron (ZOFRAN ODT) 4 MG disintegrating tablet Take 1 tablet (4 mg total) by mouth every 8 (eight) hours as needed for nausea or vomiting. Patient not taking: Reported on 05/23/2016 04/02/16   Vanetta Mulders, MD    Family History Family History  Problem Relation Age of Onset  . Lung cancer Father   . Breast cancer Sister     Social History Social History  Substance Use Topics  . Smoking status: Current Every Day Smoker    Packs/day: 0.50    Years: 20.00    Types: Cigarettes  . Smokeless tobacco: Never Used  . Alcohol use No     Allergies   Biaxin [clarithromycin]; Clarithromycin; Lisinopril; Sulfa antibiotics; Chlorthalidone; Daptomycin; Amoxicillin-pot clavulanate; Aspirin; Cholestyramine; Dilaudid [hydromorphone hcl]; Lasix [furosemide]; and Latex   Review of Systems Review of Systems  10 systems reviewed and found to be negative, except as noted in the HPI.   Physical Exam Updated  Vital Signs BP 98/69   Pulse 79   Temp 98.3 F (36.8 C) (Oral)   Resp 26   LMP 06/09/2011 Comment: verified BEFORE imaging  SpO2 94%   Physical Exam  Constitutional: She is oriented to person, place, and time. She appears well-developed and well-nourished. No distress.  HENT:  Head: Normocephalic and atraumatic.  Mouth/Throat: Oropharynx is clear and moist.  Eyes: Conjunctivae and EOM are normal. Pupils are equal, round, and reactive to light.  Neck: Normal range of motion. No JVD present. No tracheal deviation present.  Cardiovascular: Normal rate, regular rhythm and intact distal pulses.   Radial pulse equal bilaterally  Pulmonary/Chest: Effort normal and breath sounds normal. No stridor. No respiratory distress. She has no wheezes. She has no rales. She exhibits no tenderness.  Abdominal: Soft. She exhibits no distension and no mass. There is no tenderness. There is no rebound and no guarding. No hernia.  Musculoskeletal: Normal range of motion. She exhibits tenderness. She exhibits no edema.  Left wrist with no deformity, diffusely exquisitely tender to light palpation along the radial aspect for range of motion to the thumb, radial pulses 2+.  Neurological: She is alert and oriented to person, place, and time.  Skin: Skin is warm. Capillary refill takes less than 2 seconds. She is not diaphoretic.  Psychiatric: She has a normal mood and affect.  Nursing note and vitals reviewed.    ED Treatments / Results  Labs (all labs ordered are listed, but only abnormal results are displayed) Labs Reviewed  BASIC METABOLIC PANEL - Abnormal; Notable for the following:       Result Value   Sodium 133 (*)    Chloride 100 (*)    Glucose, Bld 449 (*)    BUN <5 (*)    All other components within normal limits  CBC - Abnormal; Notable for the following:    Hemoglobin 15.1 (*)    Platelets 125 (*)    All other components within normal limits  I-STAT TROPOININ, ED  POC OCCULT BLOOD, ED      EKG  EKG Interpretation  Date/Time:  Sunday May 23 2016 15:17:06 EST Ventricular Rate:  72 PR Interval:  156 QRS Duration: 82 QT Interval:  390 QTC Calculation: 427 R Axis:   47 Text  Interpretation:  Normal sinus rhythm ST & T wave abnormality, consider inferior ischemia ST & T wave abnormality, consider anterolateral ischemia Abnormal ECG Non-specific changes since 05-23-2016 Otherwise no significant change Confirmed by Erma Heritage MD, Sheria Lang [phone]) on 05/23/2016 3:26:29 PM Also confirmed by Erma Heritage MD, [place] [phone]), editor [place]  CCT, [place] ([id number])  on 05/23/2016 3:43:19 PM       Radiology Dg Chest 2 View  Result Date: 05/23/2016 CLINICAL DATA:  Chest pain for 3 days. EXAM: CHEST  2 VIEW COMPARISON:  Two-view chest x-ray 04/08/2016 FINDINGS: Heart size is normal. Lungs are clear. The visualized soft tissues and bony thorax are unremarkable. Surgical clips are again noted at the gallbladder fossa. There is no edema or effusion. IMPRESSION: Negative two view chest x-ray Electronically Signed   By: Marin Roberts M.D.   On: 05/23/2016 16:38    Procedures Procedures (including critical care time)  Medications Ordered in ED Medications  nitroGLYCERIN (NITROSTAT) SL tablet 0.4 mg (0.4 mg Sublingual Given 05/23/16 1748)  lidocaine (LIDODERM) 5 % 1 patch (1 patch Transdermal Patch Applied 05/23/16 1824)  sodium chloride 0.9 % bolus 1,000 mL (1,000 mLs Intravenous New Bag/Given 05/23/16 1748)  aspirin chewable tablet 324 mg (324 mg Oral Given 05/23/16 1704)  ondansetron (ZOFRAN) injection 4 mg (4 mg Intravenous Given 05/23/16 1705)  gabapentin (NEURONTIN) capsule 200 mg (200 mg Oral Given 05/23/16 1707)  nitroGLYCERIN (NITROGLYN) 2 % ointment 1 inch (1 inch Topical Given 05/23/16 1827)     Initial Impression / Assessment and Plan / ED Course  I have reviewed the triage vital signs and the nursing notes.  Pertinent labs & imaging results that were available  during my care of the patient were reviewed by me and considered in my medical decision making (see chart for details).  Clinical Course     Vitals:   05/23/16 1521 05/23/16 1641 05/23/16 1745 05/23/16 1815  BP: 135/100 124/82 109/65 98/69  Pulse: 73 71 65 79  Resp: 18 15 14 26   Temp: 98.3 F (36.8 C)     TempSrc: Oral     SpO2: 98% 96% 96% 94%    Medications  nitroGLYCERIN (NITROSTAT) SL tablet 0.4 mg (0.4 mg Sublingual Given 05/23/16 1748)  lidocaine (LIDODERM) 5 % 1 patch (1 patch Transdermal Patch Applied 05/23/16 1824)  sodium chloride 0.9 % bolus 1,000 mL (1,000 mLs Intravenous New Bag/Given 05/23/16 1748)  aspirin chewable tablet 324 mg (324 mg Oral Given 05/23/16 1704)  ondansetron (ZOFRAN) injection 4 mg (4 mg Intravenous Given 05/23/16 1705)  gabapentin (NEURONTIN) capsule 200 mg (200 mg Oral Given 05/23/16 1707)  nitroGLYCERIN (NITROGLYN) 2 % ointment 1 inch (1 inch Topical Given 05/23/16 1827)    VENNESA BASTEDO is 55 y.o. female presenting with Left-sided chest pressure with lightheaded sensation, onset 4 days ago and significantly worsening today. Patient with multiple cardiac risk factors including advanced age, family history, hypertension, hyperlipidemia, diabetes and tobacco use. EKG with nonspecific changes, full dose aspirin given, nitroglycerin has alleviated her chest pressure and taking it from 8 out of 10 to a 4 out of 10, Nitropaste will be given. Patient is high risk by heart score, will need admission for cardiac rule out. I doubt this is DVT/PE/dissection.  Patient is reporting darker than normal stool however, CBC with no anemia, I performed a digital rectal exam but there is no stool in the rectal vault, would like to obtain fecal occult blood testing, patient is given a hat K she has to  a bowel movement.  Patient's blood sugar is elevated, states that she has been compliant with her medications but she has had trouble affording them, states that she is  stretching them out to make them last. No elevated anion gap.   Unassigned admission to internal medicine teaching service Dr. Johnny Bridge with Attnding Dr. Cyndie Chime   Final Clinical Impressions(s) / ED Diagnoses   Final diagnoses:  Chest pain, unspecified type  Hyperglycemia without ketosis    New Prescriptions New Prescriptions   No medications on file     Wynetta Emery, [name] 05/23/16 1839    Laurence Spates, MD 05/25/16 2016

## 2016-05-23 NOTE — Progress Notes (Signed)
Orthopedic Tech Progress Note Patient Details:  [name] 12/07/1960 [id number]  Ortho Devices Type of Ortho Device: Thumb velcro splint Ortho Device/Splint Location: LUE Ortho Device/Splint Interventions: Ordered, Application   Jennye Moccasin 05/23/2016, 10:01 PM

## 2016-05-23 NOTE — ED Notes (Signed)
Admitting at bedside 

## 2016-05-23 NOTE — ED Notes (Signed)
POC occult not collected in the ED because pt unable to have BM at this time. [name] attempted occult manually but did not get anything.

## 2016-05-23 NOTE — ED Triage Notes (Signed)
Patient complains of chest tightness for the past 3-4 days. Describes as intermittent. States that it beats real hard and slow with acute dizziness, worse when she is supine. Also request further evaluation of ongoing left wrist pain.

## 2016-05-24 DIAGNOSIS — E1165 Type 2 diabetes mellitus with hyperglycemia: Secondary | ICD-10-CM | POA: Diagnosis not present

## 2016-05-24 DIAGNOSIS — R079 Chest pain, unspecified: Secondary | ICD-10-CM | POA: Diagnosis not present

## 2016-05-24 DIAGNOSIS — R002 Palpitations: Secondary | ICD-10-CM

## 2016-05-24 DIAGNOSIS — I110 Hypertensive heart disease with heart failure: Secondary | ICD-10-CM | POA: Diagnosis not present

## 2016-05-24 DIAGNOSIS — I429 Cardiomyopathy, unspecified: Secondary | ICD-10-CM

## 2016-05-24 DIAGNOSIS — J449 Chronic obstructive pulmonary disease, unspecified: Secondary | ICD-10-CM | POA: Diagnosis not present

## 2016-05-24 DIAGNOSIS — R072 Precordial pain: Secondary | ICD-10-CM

## 2016-05-24 LAB — COMPREHENSIVE METABOLIC PANEL
ALBUMIN: 3 g/dL — AB (ref 3.5–5.0)
ALT: 72 U/L — ABNORMAL HIGH (ref 14–54)
ANION GAP: 6 (ref 5–15)
AST: 84 U/L — ABNORMAL HIGH (ref 15–41)
Alkaline Phosphatase: 167 U/L — ABNORMAL HIGH (ref 38–126)
BILIRUBIN TOTAL: 0.1 mg/dL — AB (ref 0.3–1.2)
BUN: 7 mg/dL (ref 6–20)
CHLORIDE: 105 mmol/L (ref 101–111)
CO2: 27 mmol/L (ref 22–32)
Calcium: 8.7 mg/dL — ABNORMAL LOW (ref 8.9–10.3)
Creatinine, Ser: 0.46 mg/dL (ref 0.44–1.00)
GFR calc Af Amer: >60 mL/min (ref >60)
GFR calc non Af Amer: >60 mL/min (ref >60)
GLUCOSE: 324 mg/dL — AB (ref 65–99)
POTASSIUM: 3.8 mmol/L (ref 3.5–5.1)
Sodium: 138 mmol/L (ref 135–145)
TOTAL PROTEIN: 5.7 g/dL — AB (ref 6.5–8.1)

## 2016-05-24 LAB — TROPONIN I

## 2016-05-24 LAB — CBC
HEMATOCRIT: 40.7 % (ref 36.0–46.0)
HEMOGLOBIN: 13.4 g/dL (ref 12.0–15.0)
MCH: 28.5 pg (ref 26.0–34.0)
MCHC: 32.9 g/dL (ref 30.0–36.0)
MCV: 86.6 fL (ref 78.0–100.0)
Platelets: 106 10*3/uL — ABNORMAL LOW (ref 150–400)
RBC: 4.7 MIL/uL (ref 3.87–5.11)
RDW: 12.7 % (ref 11.5–15.5)
WBC: 6.5 10*3/uL (ref 4.0–10.5)

## 2016-05-24 LAB — LIPID PANEL
Cholesterol: 152 mg/dL (ref 0–200)
HDL: 22 mg/dL — AB (ref >40)
LDL Cholesterol: UNDETERMINED mg/dL (ref 0–99)
Total CHOL/HDL Ratio: 6.9 RATIO
Triglycerides: 585 mg/dL — ABNORMAL HIGH (ref <150)
VLDL: UNDETERMINED mg/dL (ref 0–40)

## 2016-05-24 LAB — GLUCOSE, CAPILLARY
GLUCOSE-CAPILLARY: 239 mg/dL — AB (ref 65–99)
GLUCOSE-CAPILLARY: 264 mg/dL — AB (ref 65–99)
Glucose-Capillary: 274 mg/dL — ABNORMAL HIGH (ref 65–99)

## 2016-05-24 LAB — BRAIN NATRIURETIC PEPTIDE: B NATRIURETIC PEPTIDE 5: 49.1 pg/mL (ref 0.0–100.0)

## 2016-05-24 LAB — HIV ANTIBODY (ROUTINE TESTING W REFLEX): HIV SCREEN 4TH GENERATION: NONREACTIVE

## 2016-05-24 MED ORDER — VALSARTAN 160 MG PO TABS: 160.0000 mg | ORAL_TABLET | Freq: Every day | ORAL | 3 refills | Status: DC

## 2016-05-24 MED ORDER — PRAVASTATIN SODIUM 40 MG PO TABS: 40.0000 mg | ORAL_TABLET | Freq: Every day | ORAL | 2 refills | Status: DC

## 2016-05-24 MED ORDER — GLIMEPIRIDE 4 MG PO TABS: 4.0000 mg | ORAL_TABLET | Freq: Every day | ORAL | 2 refills | Status: DC

## 2016-05-24 MED ORDER — TIOTROPIUM BROMIDE MONOHYDRATE 18 MCG IN CAPS: 18.0000 ug | ORAL_CAPSULE | Freq: Every day | RESPIRATORY_TRACT | 12 refills | Status: DC

## 2016-05-24 MED ORDER — BISOPROLOL FUMARATE 5 MG PO TABS: 2.5000 mg | ORAL_TABLET | Freq: Every day | ORAL | 5 refills | Status: DC

## 2016-05-24 MED ORDER — ESOMEPRAZOLE MAGNESIUM 20 MG PO CPDR: 20.0000 mg | DELAYED_RELEASE_CAPSULE | Freq: Every day | ORAL | 2 refills | Status: DC

## 2016-05-24 MED ORDER — FLUOXETINE HCL 40 MG PO CAPS: 40.0000 mg | ORAL_CAPSULE | Freq: Every day | ORAL | 3 refills | Status: DC

## 2016-05-24 MED ORDER — METFORMIN HCL 500 MG PO TABS: 500.0000 mg | ORAL_TABLET | Freq: Every day | ORAL | 3 refills | Status: DC

## 2016-05-24 MED ORDER — ASPIRIN 81 MG PO TBEC: 81.0000 mg | DELAYED_RELEASE_TABLET | Freq: Every day | ORAL | 2 refills | Status: DC

## 2016-05-24 MED ORDER — ALBUTEROL SULFATE HFA 108 (90 BASE) MCG/ACT IN AERS
2.0000 | INHALATION_SPRAY | Freq: Two times a day (BID) | RESPIRATORY_TRACT | 11 refills | Status: DC
Start: 2016-05-24 — End: 2016-05-25

## 2016-05-24 MED FILL — SPIRIVA 18 MCG CP-HANDIHALE: 18 | 30 days supply | Qty: 30 | Fill #0

## 2016-05-24 MED FILL — VALSARTAN 160 MG TABLET: 160 | 30 days supply | Qty: 30 | Fill #0

## 2016-05-24 MED FILL — PRAVASTATIN NA 40 MG TAB: 40 | 30 days supply | Qty: 30 | Fill #0

## 2016-05-24 MED FILL — VENTOLIN HFA 90 MCG INHALER: 108 (90 BAS | 30 days supply | Qty: 18 | Fill #0

## 2016-05-24 MED FILL — BISOPROLOL FUMARATE 5 MG TA: 5 | 30 days supply | Qty: 15 | Fill #0

## 2016-05-24 MED FILL — GLIMEPIRIDE 4 MG TABLET: 4 | 30 days supply | Qty: 30 | Fill #0

## 2016-05-24 MED FILL — FLUoxetine HCL 40 MG CAPS: 40 | 30 days supply | Qty: 30 | Fill #0

## 2016-05-24 MED FILL — metFORMIN HCL 500 MG TABS: 500 | 30 days supply | Qty: 30 | Fill #0

## 2016-05-24 NOTE — Progress Notes (Signed)
scheduled 0200 neb not given pt is sleeping comfortably at this time, SATs are stable  PRN neb will be given for SOB/WHEEZING.

## 2016-05-24 NOTE — Progress Notes (Signed)
Inpatient Diabetes Program Recommendations  AACE/ADA: New Consensus Statement on Inpatient Glycemic Control (2015)  Target Ranges:  Prepandial:   less than 140 mg/dL      Peak postprandial:   less than 180 mg/dL (1-2 hours)      Critically ill patients:  140 - 180 mg/dL  Results for [name], [name] (MRN [id number]) as of 05/24/2016 10:22  Ref. Range 05/23/2016 20:45 05/24/2016 07:31  Glucose-Capillary Latest Ref Range: 65 - 99 mg/dL 488 (H) 891 (H)    Review of Glycemic Control  Diabetes history: DM2 Outpatient Diabetes medications: Amaryl 4 mg QAM, Metformin 500 mg QAM Current orders for Inpatient glycemic control: Novolog 0-15 units TID with meals, Novolog 0-5 units QHS  Inpatient Diabetes Program Recommendations: Insulin - Basal: Please consider ordering low dose basal insulin; recommend starting with Lantus 7 units daily (based on 72 kg x 0.1 units). HgbA1C: A1c in process.  NOTE: In reviewing chart, noted patient has lost insurance and has been using medications sparingly over the past 3 weeks (per H&P taking every other day).   Thanks, Orlando Penner, RN, MSN, CDE Diabetes Coordinator Inpatient Diabetes Program [phone] (Team Pager from 8am to 5pm)

## 2016-05-24 NOTE — Discharge Summary (Signed)
Name: [name] MRN: [id number] DOB: 1961-01-31 55 y.o. PCP: L.Lupe Carneyean Mitchell, MD  Date of Admission: 05/23/2016  3:32 PM Date of Discharge: 05/24/2016 Attending Physician: Cephas DarbyJames Granfortuna  Discharge Diagnosis: 1. Chest pain Active Problems:   Hypertension   Anxiety   GERD (gastroesophageal reflux disease)   COPD (chronic obstructive pulmonary disease) (HCC)   Depression   Diabetes mellitus without complication (HCC)   Chest pain   Chronic systolic CHF (congestive heart failure) (HCC)   Hyperglycemia without ketosis   Tobacco abuse   Discharge Medications:   Medication List    STOP taking these medications   albuterol 108 (90 Base) MCG/ACT inhaler Commonly known as:  PROVENTIL HFA;VENTOLIN HFA   ferrous sulfate 325 (65 FE) MG EC tablet   guaiFENesin-codeine 100-10 MG/5ML syrup   ibuprofen 800 MG tablet Commonly known as:  ADVIL,MOTRIN   loperamide 2 MG tablet Commonly known as:  IMODIUM A-D   ondansetron 4 MG disintegrating tablet Commonly known as:  ZOFRAN ODT     TAKE these medications   ACCU-CHEK AVIVA PLUS test strip Generic drug:  glucose blood 1 strip by Does not apply route 4 (four) times daily.   acetaminophen 500 MG tablet Commonly known as:  TYLENOL Take 1,000 mg by mouth daily as needed (pain).   ALPRAZolam 0.5 MG tablet Commonly known as:  XANAX Take 1 mg by mouth 2 (two) times daily. Anxiety   aspirin 81 MG EC tablet Take 1 tablet (81 mg total) by mouth daily.   bisoprolol 5 MG tablet Commonly known as:  ZEBETA Take 0.5 tablets (2.5 mg total) by mouth daily.   esomeprazole 20 MG capsule Commonly known as:  NEXIUM Take 1 capsule (20 mg total) by mouth daily.   FLUoxetine 40 MG capsule Commonly known as:  PROZAC Take 1 capsule (40 mg total) by mouth at bedtime.   glimepiride 4 MG tablet Commonly known as:  AMARYL Take 1 tablet (4 mg total) by mouth daily with breakfast.   hydrOXYzine 10 MG tablet Commonly known as:   ATARAX/VISTARIL Take 20 mg by mouth daily as needed for itching. Reported on 12/01/2015   meclizine 25 MG tablet Commonly known as:  ANTIVERT Take 25 mg by mouth 4 (four) times daily as needed for dizziness.   metFORMIN 500 MG tablet Commonly known as:  GLUCOPHAGE Take 1 tablet (500 mg total) by mouth daily with breakfast.   oxyCODONE-acetaminophen 5-325 MG tablet Commonly known as:  PERCOCET/ROXICET Take 1 tablet by mouth 3 (three) times daily.   pravastatin 40 MG tablet Commonly known as:  PRAVACHOL Take 1 tablet (40 mg total) by mouth daily.   tiotropium 18 MCG inhalation capsule Commonly known as:  SPIRIVA Place 1 capsule (18 mcg total) into inhaler and inhale daily. What changed:  when to take this   valsartan 160 MG tablet Commonly known as:  DIOVAN Take 1 tablet (160 mg total) by mouth daily with breakfast.       Disposition and follow-up:   [name] [facility] Good [facility].  At the hospital follow up visit please address:  1.   --Has the patient been able to pick up and afford her medicines through the [facility]? --Has patient had further episodes of chest discomfort? --Has patient had syncopal episodes? --please address triglyceridemia  2.  Labs / imaging needed at time of follow-up: CBG, FOBT  3.  Pending labs/ test needing follow-up: FOBT  Follow-up Appointments:  Follow-up Information    [facility] Follow up on 05/26/2016.   Why:  11 am for hospital follow up Contact information: [address] [place] [address] [phone]       Kristeen Miss, MD. Call.   Specialty:  Cardiology Why:  call to set up an appointment to be seen in 3 months Contact information: [address] [place] [address] [phone]           Hospital Course by problem list: Active Problems:   Hypertension   Anxiety   GERD (gastroesophageal  reflux disease)   COPD (chronic obstructive pulmonary disease) (HCC)   Depression   Diabetes mellitus without complication (HCC)   Chest pain   Chronic systolic CHF (congestive heart failure) (HCC)   Hyperglycemia without ketosis   Tobacco abuse   Atypical Chest Pain: Patient presents with chronic chest discomfort that has increased in acuity 3-4 days prior to admission which are associated with dizziness, shortness of breath. She has been running low on her medicines and she is unable to afford them. EKG's were unchanged from priors, CXR was normal, troponins were negative x 3. She has had a left heart catheterization 05/2015 which showed widely patent coronary arteries with diffuse hypokinesis and with LVEF 25-30%. Lipid levels were obtains (below). Cardiology was consulted; they did not think patient needs inpatient stress test and otherwise continue current managemet of ASA 81mg  daily, Bisoprolol and irbesartan. Patient endorsed a few episodes of the chest discomfort and that the monitor was registering an arrythmia. Review of tele revealed intermittent PVC's but no a fib.   T2DM:  Patient with T2DM on inconsistent compliance in last 3 weeks due to running low on funds for medications. On admission, CBG was 449, but she had no gap or acidosis. During hospitalization she was maintained with SSI; she was discharge with ome dose of glimepiride 4mg  daily and metformin 500mg  dialy.   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 She was on medical management with bisoprolol 2.5mg  daily and valsartan 160mg  daily. In discussion with cardiology, no ECHO or stress test at this time; continue medical management as above. They would like her to follow up with Dr. Elease Hashimoto in 3 months for a repeat ECHO. If Echo at that time is not improving, she may be a candidate for ICD if she demonstrates compliance with follow up and  medications.   HTN: Patient with hypertension controlled on bisoprolol and valsartan at home. These were continued.   De Quervain's Tenosynovitis: Patient with a couple month history of left wrist pain with radiation up her arm on movement. She was evaluated in ED previously and asked to follow up with Ortho on outpatient basis, but patient had not due to inability to afford. On admission, she had + Finkelstein's test. She was provided with spica thumb splint and voltaren gel. May consider re-referral to ortho once patient is able to get insurance.   COPD: Patient with COPD managed with Spiriva and albuterol at home. She had mild wheezing on exam and was given duonebs. At discharge she was restarted on her home regimen.  Health Maintenance: Patient received flu shot, HIV non reactive, Hep C non reactive. Patient will need mammogram on outpatient basis.   Anxiety and depression: Patient with anxiety and depression managed with Prozac 40mg  daily, and xanax 1mg  BID PRN. She was continued on this medication for  now.   Dark stools: Patient endorsed blood per rectum. FOBT was attempted with rectal exam on admission (yeilding no masses) but unable to. Please complete FOBT. She had not had any of these stools during admission.   Discharge Vitals:   BP 113/66 (BP Location: Right Arm)   Pulse 69   Temp 98.4 F (36.9 C) (Oral)   Resp 14   Ht 5\' 7"  (1.702 m)   Wt 72.1 kg (159 lb)   LMP 06/09/2011 Comment: verified BEFORE imaging  SpO2 99%   BMI 24.90 kg/m   Pertinent Labs, Studies, and Procedures:  CBC Latest Ref Rng & Units 05/24/2016 05/23/2016 04/08/2016  WBC 4.0 - 10.5 K/uL 6.5 9.5 8.5  Hemoglobin 12.0 - 15.0 g/dL 72.0 15.1(H) 15.9(H)  Hematocrit 36.0 - 46.0 % 40.7 43.3 46.4(H)  Platelets 150 - 400 K/uL 106(L) 125(L) 161   CMP Latest Ref Rng & Units 05/24/2016 05/23/2016 04/08/2016  Glucose 65 - 99 mg/dL 947(S) 962(E) 366(Q)  BUN 6 - 20 mg/dL 7 <9(U) 5(L)  Creatinine 0.44 - 1.00  mg/dL 7.65 4.65 0.35  Sodium 135 - 145 mmol/L 138 133(L) 135  Potassium 3.5 - 5.1 mmol/L 3.8 3.5 3.5  Chloride 101 - 111 mmol/L 105 100(L) 102  CO2 22 - 32 mmol/L 27 23 22   Calcium 8.9 - 10.3 mg/dL 4.6(F) 9.0 9.2  Total Protein 6.5 - 8.1 g/dL 6.8(L) - -  Total Bilirubin 0.3 - 1.2 mg/dL 2.7(N) - -  Alkaline Phos 38 - 126 U/L 167(H) - -  AST 15 - 41 U/L 84(H) - -  ALT 14 - 54 U/L 72(H) - -    1d ago    Cholesterol 0 - 200 mg/dL 170   Triglycerides <017 mg/dL 494    HDL >49 mg/dL 22    Total CHOL/HDL Ratio RATIO 6.9   VLDL 0 - 40 mg/dL UNABLE TO CALCULATE IF TRIGLYCERIDE OVER 400 mg/dL   LDL Cholesterol 0 - 99 mg/dL UNABLE TO CALCULATE IF TRIGLYCERIDE OVER 400 mg/dL    QPRF1M 38/46/6599: 35.7    Discharge Instructions: Discharge Instructions    (HEART FAILURE PATIENTS) Call MD:  Anytime you have any of the following symptoms: 1) 3 pound weight gain in 24 hours or 5 pounds in 1 week 2) shortness of breath, with or without a dry hacking cough 3) swelling in the hands, feet or stomach 4) if you have to sleep on extra pillows at night in order to breathe.    Complete by:  As directed    Call MD for:  extreme fatigue    Complete by:  As directed    Call MD for:  persistant dizziness or light-headedness    Complete by:  As directed    Diet - low sodium heart healthy    Complete by:  As directed    Discharge instructions    Complete by:  As directed    I have sent in all of your prescriptions to the [facility]. Please pick them up today if possible.   Please keep your appointment with [facility] on [facility].  Please follow up with Dr. Elease Hashimoto with cardiology. You will need repeat imaging in 3 months.   Increase activity slowly    Complete by:  As directed       Signed: Nyra Market, MD 05/25/2016, 8:48 PM   Pager [phone]

## 2016-05-24 NOTE — Care Management Note (Signed)
Case Management Note  Patient Details  Name: [name] MRN: [id number] Date of Birth: 1961/01/01  Subjective/Objective:        Presents with chest pain, pta indep, patient states she has to pay $400 before she goes back to see pcp Dr. Lupe Carney, she needs a pcp, NCM helped her get apt with [facility] 11/29 at 11 am.  NCM also assisted with medication ast with Match Program she can use at the [facility] clinic or the other participating pharmacies on the letter.  Patient states she has transportation at discharge.  NCM will cont to follow for dc needs.           Action/Plan:   Expected Discharge Date:  05/24/16               Expected Discharge Plan:  Home/Self Care  In-House Referral:  NA  Discharge planning Services  CM Consult, MATCH Program, Medication Assistance, Indigent Health Clinic  Post Acute Care Choice:  NA Choice offered to:  NA  DME Arranged:  N/A DME Agency:  NA  HH Arranged:  NA HH Agency:  NA  Status of Service:  Completed, signed off  If discussed at Long Length of Stay Meetings, dates discussed:    Additional Comments:  [name], RN 05/24/2016, 2:46 PM

## 2016-05-24 NOTE — Discharge Instructions (Signed)
Type 2 Diabetes Mellitus, Diagnosis, Adult Type 2 diabetes (type 2 diabetes mellitus) is a long-term (chronic) disease. It may be caused by one or both of these problems:  Your body does not make enough of a hormone called insulin.  Your body does not react in a normal way to insulin that it makes. Insulin lets sugars (glucose) go into cells in the body. This gives you energy. If you have type 2 diabetes, sugars cannot get into cells. This causes high blood sugar (hyperglycemia). Your doctor will set treatment goals for you. Generally, you should have these blood sugar levels:  Before meals (preprandial): 80-130 mg/dL (4.5-4.0 mmol/L).  After meals (postprandial): below 180 mg/dL (10 mmol/L).  A1c (hemoglobin A1c) level: less than 7%. Follow these instructions at home: Questions to Ask Your Doctor  You may want to ask these questions:  Do I need to meet with a diabetes educator?  Where can I find a support group for people with diabetes?  What equipment will I need to care for myself at home?  What diabetes medicines do I need? When should I take them?  How often do I need to check my blood sugar?  What number can I call if I have questions?  When is my next doctor's visit? General instructions  Take over-the-counter and prescription medicines only as told by your doctor.  Keep all follow-up visits as told by your doctor. This is important. Contact a doctor if:  Your blood sugar is at or above 240 mg/dL (98.1 mmol/L) for 2 days in a row.  You have been sick or have had a fever for 2 days or more and you are not getting better.  You have any of these problems for more than 6 hours:  You cannot eat or drink.  You feel sick to your stomach (nauseous).  You throw up (vomit).  You have watery poop (diarrhea). Get help right away if:  Your blood sugar is lower than 54 mg/dL (3 mmol/L).  You get confused.  You have trouble:  Thinking clearly.  Breathing.  You  have moderate or large ketone levels in your pee (urine). This information is not intended to replace advice given to you by your health care provider. Make sure you discuss any questions you have with your health care provider. Document Released: 03/23/2008 Document Revised: 11/20/2015 Document Reviewed: 07/18/2015 Elsevier Interactive Patient Education  2017 [employer] Inc.  Carbohydrate Counting for Diabetes Mellitus, Adult Carbohydrate counting is a method for keeping track of how many carbohydrates you eat. Eating carbohydrates naturally increases the amount of sugar (glucose) in the blood. Counting how many carbohydrates you eat helps keep your blood glucose within normal limits, which helps you manage your diabetes (diabetes mellitus). It is important to know how many carbohydrates you can safely have in each meal. This is different for every person. A diet and nutrition specialist (registered dietitian) can help you make a meal plan and calculate how many carbohydrates you should have at each meal and snack. Carbohydrates are found in the following foods:  Grains, such as breads and cereals.  Dried beans and soy products.  Starchy vegetables, such as potatoes, peas, and corn.  Fruit and fruit juices.  Milk and yogurt.  Sweets and snack foods, such as cake, cookies, candy, chips, and soft drinks. How do I count carbohydrates? There are two ways to count carbohydrates in food. You can use either of the methods or a combination of both. Reading "Nutrition Facts" on  packaged food  The "Nutrition Facts" list is included on the labels of almost all packaged foods and beverages in the [place] It includes:  The serving size.  Information about nutrients in each serving, including the grams (g) of carbohydrate per serving. To use the Nutrition Facts":  Decide how many servings you will have.  Multiply the number of servings by the number of carbohydrates per serving.  The resulting  number is the total amount of carbohydrates that you will be having. Learning standard serving sizes of other foods  When you eat foods containing carbohydrates that are not packaged or do not include "Nutrition Facts" on the label, you need to measure the servings in order to count the amount of carbohydrates:  Measure the foods that you will eat with a food scale or measuring cup, if needed.  Decide how many standard-size servings you will eat.  Multiply the number of servings by 15. Most carbohydrate-rich foods have about 15 g of carbohydrates per serving.  For example, if you eat 8 oz (170 g) of strawberries, you will have eaten 2 servings and 30 g of carbohydrates (2 servings x 15 g = 30 g).  For foods that have more than one food mixed, such as soups and casseroles, you must count the carbohydrates in each food that is included. The following list contains standard serving sizes of common carbohydrate-rich foods. Each of these servings has about 15 g of carbohydrates:   hamburger bun or  English muffin.   oz (15 mL) syrup.   oz (14 g) jelly.  1 slice of bread.  1 six-inch tortilla.  3 oz (85 g) cooked rice or pasta.  4 oz (113 g) cooked dried beans.  4 oz (113 g) starchy vegetable, such as peas, corn, or potatoes.  4 oz (113 g) hot cereal.  4 oz (113 g) mashed potatoes or  of a large baked potato.  4 oz (113 g) canned or frozen fruit.  4 oz (120 mL) fruit juice.  4-6 crackers.  6 chicken nuggets.  6 oz (170 g) unsweetened dry cereal.  6 oz (170 g) plain fat-free yogurt or yogurt sweetened with artificial sweeteners.  8 oz (240 mL) milk.  8 oz (170 g) fresh fruit or one small piece of fruit.  24 oz (680 g) popped popcorn. Example of carbohydrate counting Sample meal  3 oz (85 g) chicken breast.  6 oz (170 g) brown rice.  4 oz (113 g) corn.  8 oz (240 mL) milk.  8 oz (170 g) strawberries with sugar-free whipped topping. Carbohydrate  calculation 1. Identify the foods that contain carbohydrates:  Rice.  Corn.  Milk.  Strawberries. 2. Calculate how many servings you have of each food:  2 servings rice.  1 serving corn.  1 serving milk.  1 serving strawberries. 3. Multiply each number of servings by 15 g:  2 servings rice x 15 g = 30 g.  1 serving corn x 15 g = 15 g.  1 serving milk x 15 g = 15 g.  1 serving strawberries x 15 g = 15 g. 4. Add together all of the amounts to find the total grams of carbohydrates eaten:  30 g + 15 g + 15 g + 15 g = 75 g of carbohydrates total. This information is not intended to replace advice given to you by your health care provider. Make sure you discuss any questions you have with your health care provider. Document Released: 06/14/2005  Document Revised: 01/02/2016 Document Reviewed: 11/26/2015 Elsevier Interactive Patient Education  2017 [employer]

## 2016-05-24 NOTE — Consult Note (Signed)
CARDIOLOGY CONSULT NOTE   Patient ID: [name] MRN: [id number] DOB/AGE: 12-18-1960 55 y.o.  Admit date: 05/23/2016  Primary Physician   Lupe Carney, MD Primary Cardiologist   Dr. Elease Hashimoto Reason for Consultation   Chest Pain Requesting Physician  Dr. Cyndie Chime  HPI: [name] is a 55 y.o. female with a history of chronic systolic CHF (EF 40-98% by TTE 05/2015), palpitations, COPD, HTN, T2DM, HLD, and tobacco use who presented with chest pressure.   Patient was in her usual state of health three days ago, watching TV at home, when she began to have left-sided chest pressure/heaviness, 8/10 in intensity, with radiation to her left neck. She had associated lightheadedness and feeling that she was going to pass out, but did not have a syncopal episode. Her pain was intermittent, exacerbated by exertion. She does report chronic feelings of palpitations that were worse during this episode. She does have occasional lightheadedness that occurs without chest pressure. She did not have any diaphoresis, nausea, or vomiting. She had associated shortness of breath with her chest tightness, worse when laying flat. She has recent lower abdominal "menstrual cramp" type pain. She reports a family history of heart disease in her father who had an MI at age 4 requiring bypass surgery. She does admit that she has not been taking her medications as prescribed due to cost, instead spacing them out.  She was given Aspirin 324 mg and sublingual nitroglycerin which relieved her chest pressure. Initial point of care troponin was negative. Troponin I has been negative x 2. EKG shows NSR with TWI in inferior leads and V3-5 which are present on prior, slightly more pronounced this time. CXR is without acute cardiopulmonary disease.  She is a current smoker of 0.5 PPD, previously smoked 1-1.5 PPD for 20 years. She denies any alcohol or illicit drug use. She lives in [place]. Her Cardiologist is Dr. Elease Hashimoto,  however she has missed follow up appointments again citing cost as an issue.  She had a LHC in 05/2015 which showed widely patent coronary arteries with severe global LV dysfunction, EF < 20%.     Past Medical History:  Diagnosis Date  . Anxiety   . Arthritis   . Chronic systolic heart failure (HCC)    EF 20-25% ECHO 05/2015  . COPD (chronic obstructive pulmonary disease) (HCC)   . Depression   . Diabetes mellitus without complication (HCC)    Type II  . Dysrhythmia    pt unsure of name of arrythmia - " my heart rate will drop all of a sudden" -no current treatment - atrial flutter  . Fibrillation, atrial (HCC)   . Gallstones   . GERD (gastroesophageal reflux disease)   . Headache(784.0)    migraines  . Hypertension   . Osteomyelitis (HCC)    R ankle  . Rash    arms     Past Surgical History:  Procedure Laterality Date  . ANKLE FRACTURE SURGERY Right 2015  . CARDIAC CATHETERIZATION N/A 06/24/2015   Procedure: Left Heart Cath and Coronary Angiography;  Surgeon: [name], MD;  Location: [facility] INVASIVE CV LAB;  Service: Cardiovascular;  Laterality: N/A;  . CARPAL TUNNEL RELEASE     bil  . CHOLECYSTECTOMY  11/24/2011   Procedure: LAPAROSCOPIC CHOLECYSTECTOMY WITH INTRAOPERATIVE CHOLANGIOGRAM;  Surgeon: Clovis Pu. Cornett, MD;  Location: [facility] ORS;  Service: General;  Laterality: N/A;  Laparoscopic Cholecystectomy with Cholangiogram  . ECTOPIC PREGNANCY SURGERY  many yrs ago  . HARDWARE REMOVAL  Right 07/15/2015   Procedure: RIGHT ANKLE HARDWARE REMOVAL, PLACEMENT OF STIMULAN ANTIBIOTIC BEADS AND PREVENA WOUND VAC.;  Surgeon: [name], MD;  Location: [facility] OR;  Service: Orthopedics;  Laterality: Right;    Allergies  Allergen Reactions  . Biaxin [Clarithromycin] Anaphylaxis and Swelling  . Clarithromycin Shortness Of Breath and Swelling  . Lisinopril Swelling and Cough    Lip edema  . Sulfa Antibiotics Anaphylaxis, Shortness Of Breath, Swelling and Hypertension  .  Chlorthalidone Nausea And Vomiting and Other (See Comments)    Clammy, Tachycardia, Headache   . Daptomycin Nausea And Vomiting  . Amoxicillin-Pot Clavulanate Diarrhea    Has patient had a PCN reaction causing immediate rash, facial/tongue/throat swelling, SOB or lightheadedness with hypotension:No Has patient had a PCN reaction causing severe rash involving mucus membranes or skin necrosis:No Has patient had a PCN reaction that required hospitalization:No Has patient had a PCN reaction occurring within the last 10 years:Yes If all of the above answers are "NO", then may proceed with Cephalosporin use.   . Aspirin Nausea And Vomiting and Rash  . Cholestyramine Nausea And Vomiting  . Dilaudid [Hydromorphone Hcl] Nausea And Vomiting  . Lasix [Furosemide] Nausea And Vomiting and Other (See Comments)    Headache   . Latex Hives and Rash    I have reviewed the patient's current medications . aspirin EC  81 mg Oral Daily  . bisoprolol  2.5 mg Oral Daily  . diclofenac sodium  2 g Topical QID  . enoxaparin (LOVENOX) injection  40 mg Subcutaneous Q24H  . FLUoxetine  40 mg Oral QHS  . Influenza vac split quadrivalent PF  0.5 mL Intramuscular Tomorrow-1000  . insulin aspart  0-15 Units Subcutaneous TID WC  . insulin aspart  0-5 Units Subcutaneous QHS  . ipratropium-albuterol  3 mL Nebulization Q6H WA  . irbesartan  150 mg Oral Daily  . pantoprazole  40 mg Oral Daily  . pravastatin  40 mg Oral Daily  . sodium chloride flush  3 mL Intravenous Q12H    acetaminophen **OR** acetaminophen, albuterol, ALPRAZolam, nitroGLYCERIN, oxyCODONE-acetaminophen  Prior to Admission medications   Medication Sig Start Date End Date Taking? Authorizing Provider  acetaminophen (TYLENOL) 500 MG tablet Take 1,000 mg by mouth daily as needed (pain).   Yes Historical Provider, MD  albuterol (PROVENTIL HFA;VENTOLIN HFA) 108 (90 BASE) MCG/ACT inhaler Inhale 2 puffs into the lungs 2 (two) times daily. Wheezing   Yes  Historical Provider, MD  ALPRAZolam (XANAX) 0.5 MG tablet Take 1 mg by mouth 2 (two) times daily. Anxiety   Yes Historical Provider, MD  bisoprolol (ZEBETA) 5 MG tablet Take 2.5 mg by mouth daily. 02/20/16  Yes Historical Provider, MD  esomeprazole (NEXIUM) 20 MG capsule Take 20 mg by mouth daily.    Yes Historical Provider, MD  FLUoxetine (PROZAC) 40 MG capsule Take 40 mg by mouth at bedtime.    Yes Historical Provider, MD  glimepiride (AMARYL) 4 MG tablet Take 1 tablet (4 mg total) by mouth daily with breakfast. 10/25/15  Yes [name], MD  hydrOXYzine (ATARAX/VISTARIL) 10 MG tablet Take 20 mg by mouth daily as needed for itching. Reported on 12/01/2015   Yes Historical Provider, MD  loperamide (IMODIUM A-D) 2 MG tablet Take 1 tablet (2 mg total) by mouth 4 (four) times daily as needed for diarrhea or loose stools. Patient taking differently: Take 4 mg by mouth 3 (three) times daily.  04/02/16  Yes Vanetta Mulders, MD  meclizine (ANTIVERT) 25 MG  tablet Take 25 mg by mouth 4 (four) times daily as needed for dizziness.   Yes Historical Provider, MD  metFORMIN (GLUCOPHAGE) 500 MG tablet Take 500 mg by mouth daily with breakfast.    Yes Historical Provider, MD  oxyCODONE-acetaminophen (PERCOCET/ROXICET) 5-325 MG tablet Take 1 tablet by mouth 3 (three) times daily.   Yes Historical Provider, MD  pravastatin (PRAVACHOL) 40 MG tablet Take 40 mg by mouth daily.   Yes Historical Provider, MD  tiotropium (SPIRIVA) 18 MCG inhalation capsule Place 18 mcg into inhaler and inhale daily.   Yes Historical Provider, MD  valsartan (DIOVAN) 160 MG tablet Take 160 mg by mouth daily with breakfast.    Yes Historical Provider, MD  ferrous sulfate 325 (65 FE) MG EC tablet Take 1 tablet (325 mg total) by mouth daily with breakfast. Patient not taking: Reported on 05/23/2016 11/03/15   Renae FickleMackenzie Short, MD  glucose blood (ACCU-CHEK AVIVA PLUS) test strip 1 strip by Does not apply route 4 (four) times daily. 08/12/15    Historical Provider, MD  guaiFENesin-codeine 100-10 MG/5ML syrup Take 5-10 mLs by mouth every 6 (six) hours as needed for cough. Patient not taking: Reported on 05/23/2016 04/09/16   [name], DO  ibuprofen (ADVIL,MOTRIN) 800 MG tablet Take 1 tablet (800 mg total) by mouth every 8 (eight) hours as needed for mild pain. Patient not taking: Reported on 05/23/2016 04/09/16   [name], DO  ondansetron (ZOFRAN ODT) 4 MG disintegrating tablet Take 1 tablet (4 mg total) by mouth every 8 (eight) hours as needed for nausea or vomiting. Patient not taking: Reported on 05/23/2016 04/02/16   Vanetta MuldersScott Zackowski, MD     Social History   Social History  . Marital status: Divorced    Spouse name: N/A  . Number of children: N/A  . Years of education: N/A   Occupational History  . Not on file.   Social History Main Topics  . Smoking status: Current Every Day Smoker    Packs/day: 0.50    Years: 20.00    Types: Cigarettes  . Smokeless tobacco: Never Used  . Alcohol use No  . Drug use: No  . Sexual activity: Not Currently    Birth control/ protection: Post-menopausal   Other Topics Concern  . Not on file   Social History Narrative  . No narrative on file    Family Status  Relation Status  . Mother Alive  . Father Alive  . Sister    Family History  Problem Relation Age of Onset  . Lung cancer Father   . Heart attack Father [id number]    CABG x4  . Breast cancer Sister     Survivor       ROS:  Full 14 point review of systems complete and found to be negative unless listed above.  Physical Exam: Blood pressure 138/79, pulse 64, temperature 98.3 F (36.8 C), temperature source Oral, resp. rate 16, height 5\' 7"  (1.702 m), weight 159 lb (72.1 kg), last menstrual period 06/09/2011, SpO2 93 %.  General: Well developed, well nourished, female in no acute distress Head: EOMI, No xanthomas. Normocephalic and atraumatic, oropharynx without edema or exudate.  Lungs: Resp regular and  unlabored, coarse breath sounds. Heart: RRR no s3, s4. 2/6 systolic murmur heard at the LUS border.   Neck: No carotid bruits. No lymphadenopathy. No  JVD. Abdomen: Bowel sounds present, abdomen soft and non-tender without masses or hernias noted. Msk:  Tender to palpation over left chest  wall. No weakness, no joint deformities or effusions. Extremities: No clubbing, cyanosis or edema. DP/PT/Radials 2+ and equal bilaterally. Neuro: Alert and oriented X 3. No focal deficits noted. Psych:  Good affect, responds appropriately Skin: No rashes or lesions noted.  Labs:   Lab Results  Component Value Date   WBC 6.5 05/24/2016   HGB 13.4 05/24/2016   HCT 40.7 05/24/2016   MCV 86.6 05/24/2016   PLT 106 (L) 05/24/2016   No results for input(s): INR in the last 72 hours.   Recent Labs Lab 05/24/16 0415  NA 138  K 3.8  CL 105  CO2 27  BUN 7  CREATININE 0.46  CALCIUM 8.7*  PROT 5.7*  BILITOT 0.1*  ALKPHOS 167*  ALT 72*  AST 84*  GLUCOSE 324*  ALBUMIN 3.0*   Magnesium  Date Value Ref Range Status  04/05/2009 1.9 1.5 - 2.5 mg/dL Final    Recent Labs  16/10/96 2137 05/24/16 0415  TROPONINI <0.03 <0.03    Recent Labs  05/23/16 1544  TROPIPOC 0.00   Pro B Natriuretic peptide (BNP)  Date/Time Value Ref Range Status  09/18/2013 10:33 PM 35.7 0 - 125 pg/mL Final  01/31/2009 12:15 AM <30.0 0.0 - 100.0 pg/mL Final   Lab Results  Component Value Date   CHOL 152 05/24/2016   HDL 22 (L) 05/24/2016   LDLCALC UNABLE TO CALCULATE IF TRIGLYCERIDE OVER 400 mg/dL 04/54/0981   TRIG 191 (H) 05/24/2016   Lab Results  Component Value Date   DDIMER 1.85 (H) 10/25/2013   Lipase  Date/Time Value Ref Range Status  11/01/2015 07:27 PM 99 (H) 11 - 51 U/L Final   TSH  Date/Time Value Ref Range Status  02/02/2009 05:00 AM 0.151 Test methodology is 3rd generation TSH (L) 0.350 - 4.500 uIU/mL Final   Ferritin  Date/Time Value Ref Range Status  11/02/2015 08:13 AM 12 11 - 307  ng/mL Final   TIBC  Date/Time Value Ref Range Status  11/02/2015 08:13 AM 384 250 - 450 ug/dL Final   Iron  Date/Time Value Ref Range Status  11/02/2015 08:13 AM 23 (L) 28 - 170 ug/dL Final    Echo:  TTE 47/82/9562: - Left ventricle: The cavity size was mildly dilated. Systolic   function was severely reduced. The estimated ejection fraction   was in the range of 25% to 30%. Diffuse hypokinesis. Doppler   parameters are consistent with abnormal left ventricular   relaxation (grade 1 diastolic dysfunction). Doppler parameters   are consistent with high ventricular filling pressure. - Aortic valve: There was trivial regurgitation. - Mitral valve: There was mild regurgitation. - Left atrium: The atrium was mildly dilated.  Impressions:  - Severe global reduction in LV function; grade 1 diastolic   dysfunction with elevated LV filling pressure; mild LVE and LAE;   trace AI; mild MR.  Left Heart Cath 06/24/15: 1. There is severe left ventricular systolic dysfunction.    Widely patent coronary arteries. Right dominant coronary circulation anatomy.  Severe global left ventricular dysfunction with EF less than 20%. Upper normal left ventricular filling pressures with EDP less than 18 mmHg.   RECOMMENDATIONS:   Medical therapy  Consider resynchronization therapy if there is no improvement in LV function on meds.  ECG:  NSR, TWI inferior leads and V3-V5  Radiology:  Dg Chest 2 View  Result Date: 05/23/2016 CLINICAL DATA:  Chest pain for 3 days. EXAM: CHEST  2 VIEW COMPARISON:  Two-view chest x-ray 04/08/2016 FINDINGS: Heart size  is normal. Lungs are clear. The visualized soft tissues and bony thorax are unremarkable. Surgical clips are again noted at the gallbladder fossa. There is no edema or effusion. IMPRESSION: Negative two view chest x-ray Electronically Signed   By: Marin Roberts M.D.   On: 05/23/2016 16:38    ASSESSMENT AND PLAN:    Active Problems:    Hypertension   Anxiety   GERD (gastroesophageal reflux disease)   COPD (chronic obstructive pulmonary disease) (HCC)   Depression   Diabetes mellitus without complication (HCC)   Chest pain   Chronic systolic CHF (congestive heart failure) (HCC)   Hyperglycemia without ketosis   Tobacco abuse  51 female with a history of chronic systolic CHF (EF 96-04% by TTE 05/2015), palpitations, COPD, HTN, T2DM, HLD, and tobacco use who presented with chest pressure.  Chest Pressure: Patient with atypical chest pain. She has patent arteries by left heart cath 1 year ago and negative troponins. EKG is abnormal, but without significant change from prior. She does admit not taking her medications as prescribed due to cost. We recommend that she resume her home medications daily and repeat an echocardiogram in 3 months from now. She is at risk for sudden cardiac death due to her low EF and may be considered for ICD in the future. She will also need risk factor modification to reduce the risk of her developing coronary artery disease. -No further cardiac workup -Will add diet and hold off on TTE  NICM: EF 25-30% per TTE December 2016. As above, she is at risk for sudden cardiac death with her low EF. Will need medication assistance to resume her home meds which she should continue as prescribed. -Continue Bisoprolol 2.5. Mg daily, Valsartan 160 mg daily -Repeat TTE in 3 months -f/u with Dr. Elease Hashimoto  Palpitations: Long history of palpitations, per chart review has worn a monitor in the past. She is not on anticoagulation as there is no documented Atrial fibrillation. No Afib seen on telemetry.  HTN: BP stable on Bisoprolol and Valsartan  HLD: On Pravastatin 40 mg daily  Tobacco use: Current smoker of 0.5 PPD. She is advised on complete smoking cessation.   Signed: Darreld Mclean, MD  PGY-2 Internal Medicine Resident 05/24/2016, 10:03 AM  As above, patient seen and examined. I agree with resident's H&P  including past medical history, review of systems, social history, family history and physical examination. Briefly she is a 55 year old female with past medical history of nonischemic cardiomyopathy, diabetes mellitus, hypertension, hyperlipidemia, COPD, tobacco abuse for evaluation of chest pain. Patient had cardiac catheterization December 2016 that showed patent coronary arteries with ejection fraction less than 20%. She has not been compliant with follow-up. She also stopped taking some of her medications intermittently because of cost by her report. She complains of intermittent chest pain for several days. The pain is in the substernal, left chest and back area. It lasts less than 2 minutes and resolves spontaneously. It is not exertional. It increases with inspiration. There is some nausea but no diaphoresis. There is some dyspnea. She also describes intermittent palpitations associated with dizziness but no syncope. She has had these symptoms for several years by her report. She also notes some dyspnea on exertion. Troponins are normal. BNP normal. Electrocardiogram shows sinus rhythm with anterior and inferior T-wave changes unchanged compared to previous.  1 chest pain-symptoms are very atypical. Cardiac catheterization in December 2016 showed no coronary disease. Troponins normal. Electrocardiogram unchanged. Would not pursue further ischemia evaluation.  2  history of nonischemic cardiomyopathy-patient has not been taking her medications because of cost. Would resume ARB and beta blocker. Medications can be titrated as an outpatient as tolerated. Would then repeat echocardiogram 3 months later. If ejection fraction less than 35% would need ICD. Note she would need to demonstrate compliance with follow-up and medications for she would be a candidate for ICD.   3 Tobacco abuse-patient counseled on discontinuing.   4 palpitations-these apparently are chronic. Could pursue outpatient monitor at  discharge.   Pt should FU with Dr Elease Hashimoto following DC.  Olga Millers, MD

## 2016-05-24 NOTE — Progress Notes (Signed)
   Subjective:  Patient states that she has gotten a few more episodes where she feels like her heart rate has slowed down, which is associated with a funny feeling in her chest. When these episodes occur, she has some dizziness and shortness of breath that resolve quickly. The episodes last only a few seconds. Patient has no other complaints or concerns at this time.   Objective:  Vital signs in last 24 hours: Vitals:   05/24/16 0843 05/24/16 1100 05/24/16 1159 05/24/16 1413  BP:  113/66    Pulse:  66 73   Resp:  16 16   Temp:  98.3 F (36.8 C) 98.1 F (36.7 C)   TempSrc:  Oral Oral   SpO2: 93% 93%  94%  Weight:      Height:       Constitutional: NAD, pleasant, VS reviewed. CV: RRR, no murmurs, rubs or gallops appreciated, no LE edema present, pulses intact Resp: CTAB, no increased work of breathing, mild diffuse wheezing, no crackles appreciated Abd: soft, NDNT, +BS MSK: moves all 4 extremities freely  Assessment/Plan:  Active Problems:   Hypertension   Anxiety   GERD (gastroesophageal reflux disease)   COPD (chronic obstructive pulmonary disease) (HCC)   Depression   Diabetes mellitus without complication (HCC)   Chest pain   Chronic systolic CHF (congestive heart failure) (HCC)   Hyperglycemia without ketosis   Tobacco abuse  Atypical Chest Pain: Patient continues to have episodes of chest discomfort associated per patient with irregular heart beat. Troponins have been negative x 3. AM EKG with no acute ST segment or T wave changes. Telemetry review shows only intermittent PVC's. With patient's history of dilated cardiomyopathy, we consulted cardiology for possibility of further work up. They do not wish to pursue further ischemic workup in setting of no acute changes in troponins, EKG and a clean heart cath Dec 2016.  --Continue medical management of cardiac disease --CM contacted for medication assistance, appreciate their help  Combined systolic and diastolic  congestive heart failure: Patient with history of NICM combined CHF with intermittent compliance with medication regimen and follow up due to cost.  --Continue bisoprolol 2.5mg  daily, valsartan 160mg  daily --repeat echo in 3 months - if EF at that time is <35%, she may need ICD --f/u with Dr. Elease Hashimoto  HTN: Patient with history of HTN on bisoprolol and valsartan at home. BP stable. --continue home regimen  T2DM: Morning BG was 324 and CMP did not show a gap. Patient will be discharged on her home dose of Glimepiride 4mg  daily and metformin 500mg  daily.  --will need close f/u for medication adjustment --f/u A1c  De Quervain's Tenosynovitis: Patient provided with spica thumb splint and voltaren gel. She has been referred to Ortho but was unable to follow up. Will pass this on to new PCP.   COPD:  Patient with mild wheezing on exam. --Duonebs while inpatient --will restart spiriva at discharge  Health maintenance: Patient received flu shot. CM assistance with PCP and insurance; patient to follow up with [facility]. --f/u HIV, Hep C --patient will need mammogram outpatient  Anxiety and depression: --Prozac 40mg  daily --Xanax 1mg  BID PRN  Dark Stools: Hgb stable. FOBT order placed but no sample could be provided. --f/u outpatient   Dispo: Anticipated discharge today.   Nyra Market, MD 05/24/2016, 2:58 PM Pager [phone]

## 2016-05-25 ENCOUNTER — Telehealth: Payer: Self-pay | Admitting: *Deleted

## 2016-05-25 LAB — HEMOGLOBIN A1C
Hgb A1c MFr Bld: 11.6 % — ABNORMAL HIGH (ref 4.8–5.6)
MEAN PLASMA GLUCOSE: 286 mg/dL

## 2016-05-25 LAB — HEPATITIS C ANTIBODY: HCV Ab: <0.1 s/co ratio (ref 0.0–0.9)

## 2016-05-25 MED ORDER — ALBUTEROL SULFATE HFA 108 (90 BASE) MCG/ACT IN AERS: 2.0000 | INHALATION_SPRAY | Freq: Four times a day (QID) | RESPIRATORY_TRACT | 11 refills | Status: DC | PRN

## 2016-05-25 NOTE — Telephone Encounter (Signed)
Call from [facility] requesting clarification on rx for pt's ventolin inhaler . Directions for use is inhale 2 (two) puffs into lungs 2 (two) times daily for wheezing.  Pharmacy needs to know if pt should take "as needed" or as prescribed since this is a rescue inhaler.  Of note, this pt has was seen in the hospital by [facility] and is not a current [facility]patient, but she does have an appt scheduled for tomorrow with [facility].  Will forward request to prescribing MD for review, please advise.Kingsley Spittle Cassady11/28/20173:15 PM

## 2016-05-26 ENCOUNTER — Encounter: Payer: Self-pay | Admitting: Physician Assistant

## 2016-05-26 ENCOUNTER — Ambulatory Visit: Payer: Self-pay | Attending: Internal Medicine | Admitting: Physician Assistant

## 2016-05-26 VITALS — BP 138/91 | HR 69 | Temp 98.0°F | Wt 158.4 lb

## 2016-05-26 DIAGNOSIS — Z9049 Acquired absence of other specified parts of digestive tract: Secondary | ICD-10-CM | POA: Insufficient documentation

## 2016-05-26 DIAGNOSIS — E119 Type 2 diabetes mellitus without complications: Secondary | ICD-10-CM

## 2016-05-26 DIAGNOSIS — Z79899 Other long term (current) drug therapy: Secondary | ICD-10-CM | POA: Insufficient documentation

## 2016-05-26 DIAGNOSIS — Z0001 Encounter for general adult medical examination with abnormal findings: Secondary | ICD-10-CM | POA: Insufficient documentation

## 2016-05-26 DIAGNOSIS — Z7984 Long term (current) use of oral hypoglycemic drugs: Secondary | ICD-10-CM | POA: Insufficient documentation

## 2016-05-26 DIAGNOSIS — Z9889 Other specified postprocedural states: Secondary | ICD-10-CM | POA: Insufficient documentation

## 2016-05-26 DIAGNOSIS — M25532 Pain in left wrist: Secondary | ICD-10-CM | POA: Insufficient documentation

## 2016-05-26 DIAGNOSIS — E781 Pure hyperglyceridemia: Secondary | ICD-10-CM | POA: Insufficient documentation

## 2016-05-26 DIAGNOSIS — E1165 Type 2 diabetes mellitus with hyperglycemia: Secondary | ICD-10-CM | POA: Insufficient documentation

## 2016-05-26 DIAGNOSIS — Z7982 Long term (current) use of aspirin: Secondary | ICD-10-CM | POA: Insufficient documentation

## 2016-05-26 DIAGNOSIS — Z720 Tobacco use: Secondary | ICD-10-CM | POA: Insufficient documentation

## 2016-05-26 DIAGNOSIS — I110 Hypertensive heart disease with heart failure: Secondary | ICD-10-CM | POA: Insufficient documentation

## 2016-05-26 DIAGNOSIS — I10 Essential (primary) hypertension: Secondary | ICD-10-CM

## 2016-05-26 DIAGNOSIS — Z9114 Patient's other noncompliance with medication regimen: Secondary | ICD-10-CM | POA: Insufficient documentation

## 2016-05-26 DIAGNOSIS — I5022 Chronic systolic (congestive) heart failure: Secondary | ICD-10-CM | POA: Insufficient documentation

## 2016-05-26 DIAGNOSIS — J449 Chronic obstructive pulmonary disease, unspecified: Secondary | ICD-10-CM | POA: Insufficient documentation

## 2016-05-26 LAB — GLUCOSE, POCT (MANUAL RESULT ENTRY): POC GLUCOSE: 310 mg/dL — AB (ref 70–99)

## 2016-05-26 MED ORDER — FENOFIBRATE 145 MG PO TABS: 145.0000 mg | ORAL_TABLET | Freq: Every day | ORAL | 3 refills | Status: DC

## 2016-05-26 NOTE — Patient Instructions (Signed)
Pls start back taking all medicines as prescribed Return in 1 week Cont to work on your smoking

## 2016-05-26 NOTE — Progress Notes (Signed)
[name]  ZOX:[id number]  [id number]:[id number]  DOB - 06/03/1961  Chief Complaint  Patient presents with  . Hospitalization Follow-up    chest pain       Subjective:   [name] is a 55 y.o. female with PMHx of COPD with continued tobacco use, HTN, DM2, chronic systolic CHF with EF 25%, and HL who is here today for establishment of care. She previously was a patient at [facility] but lost her insurance this Spring. Since then she has been "rationing" out her pills; taking pills a few times per week.   She has presented to the EDF 3 times over the last 6 week: 1. 10/17-flu like sxs; symptomatic relief meds 2. 10/17 - no improvement in flu like sxs with assoc cough this time and treated like a COPD exac AND left wrist pain, negative Xray 3. 11/26-27, 2017 admitted with CP, palps, and near syncope. Seen by Cardiology. Last cath 12/16 and normal. EF known to be 25-30%. Has been noncompliant with meds and they rec 3 months of good medication mgmt and then repeat echo and decide on ICD. She ruled out for mI.  Of note BS were 230-320 during hospitalization with A1C of 11%.  Continues to smoke a few cigs per day.  ROS: GEN: denies fever or chills, denies change in weight Skin: denies lesions or rashes HEENT: denies headache, earache, epistaxis, sore throat, or neck pain LUNGS: denies SHOB, dyspnea, PND, orthopnea CV: denies CP or palpitations ABD: denies abd pain, N or V EXT: denies muscle spasms or swelling; no pain in lower ext, no weakness NEURO: denies numbness or tingling, denies sz, stroke or TIA  ALLERGIES: Allergies  Allergen Reactions  . Biaxin [Clarithromycin] Anaphylaxis and Swelling  . Clarithromycin Shortness Of Breath and Swelling  . Lisinopril Swelling and Cough    Lip edema  . Sulfa Antibiotics Anaphylaxis, Shortness Of Breath, Swelling and Hypertension  . Chlorthalidone Nausea And Vomiting and Other (See Comments)    Clammy, Tachycardia, Headache   .  Daptomycin Nausea And Vomiting  . Amoxicillin-Pot Clavulanate Diarrhea    Has patient had a PCN reaction causing immediate rash, facial/tongue/throat swelling, SOB or lightheadedness with hypotension:No Has patient had a PCN reaction causing severe rash involving mucus membranes or skin necrosis:No Has patient had a PCN reaction that required hospitalization:No Has patient had a PCN reaction occurring within the last 10 years:Yes If all of the above answers are "NO", then may proceed with Cephalosporin use.   . Aspirin Nausea And Vomiting and Rash  . Cholestyramine Nausea And Vomiting  . Dilaudid [Hydromorphone Hcl] Nausea And Vomiting  . Lasix [Furosemide] Nausea And Vomiting and Other (See Comments)    Headache   . Latex Hives and Rash    PAST MEDICAL HISTORY: Past Medical History:  Diagnosis Date  . Anxiety   . Arthritis   . Chronic systolic heart failure (HCC)    EF 20-25% ECHO 05/2015  . COPD (chronic obstructive pulmonary disease) (HCC)   . Depression   . Diabetes mellitus without complication (HCC)    Type II  . Dysrhythmia    pt unsure of name of arrythmia - " my heart rate will drop all of a sudden" -no current treatment - atrial flutter  . Fibrillation, atrial (HCC)   . Gallstones   . GERD (gastroesophageal reflux disease)   . Headache(784.0)    migraines  . Hypertension   . Osteomyelitis (HCC)    R ankle  . Rash  arms    PAST SURGICAL HISTORY: Past Surgical History:  Procedure Laterality Date  . ANKLE FRACTURE SURGERY Right 2015  . CARDIAC CATHETERIZATION N/A 06/24/2015   Procedure: Left Heart Cath and Coronary Angiography;  Surgeon: [name], MD;  Location[facility] INVASIVE CV LAB;  Service: Cardiovascular;  Laterality: N/A;  . CARPAL TUNNEL RELEASE     bil  . CHOLECYSTECTOMY  11/24/2011   Procedure: LAPAROSCOPIC CHOLECYSTECTOMY WITH INTRAOPERATIVE CHOLANGIOGRAM;  Surgeon: Clovis Pu. Cornett, MD;  Location: [facility] ORS;  Service: General;  Laterality: N/A;   Laparoscopic Cholecystectomy with Cholangiogram  . ECTOPIC PREGNANCY SURGERY  many yrs ago  . HARDWARE REMOVAL Right 07/15/2015   Procedure: RIGHT ANKLE HARDWARE REMOVAL, PLACEMENT OF STIMULAN ANTIBIOTIC BEADS AND PREVENA WOUND VAC.;  Surgeon: [name], MD;  Location: [facility] OR;  Service: Orthopedics;  Laterality: Right;    MEDICATIONS AT HOME: Prior to Admission medications   Medication Sig Start Date End Date Taking? Authorizing Provider  acetaminophen (TYLENOL) 500 MG tablet Take 1,000 mg by mouth daily as needed (pain).   Yes Historical Provider, MD  albuterol (PROVENTIL HFA;VENTOLIN HFA) 108 (90 Base) MCG/ACT inhaler Inhale 2 puffs into the lungs every 6 (six) hours as needed for wheezing or shortness of breath. For wheezing 05/25/16  Yes Nyra Market, MD  ALPRAZolam [name]) 0.5 MG tablet Take 1 mg by mouth 2 (two) times daily. Anxiety   Yes Historical Provider, MD  aspirin 81 MG EC tablet Take 1 tablet (81 mg total) by mouth daily. 05/25/16  Yes Nyra Market, MD  bisoprolol (ZEBETA) 5 MG tablet Take 0.5 tablets (2.5 mg total) by mouth daily. 05/24/16  Yes Nyra Market, MD  esomeprazole (NEXIUM) 20 MG capsule Take 1 capsule (20 mg total) by mouth daily. 05/24/16  Yes Nyra Market, MD  FLUoxetine (PROZAC) 40 MG capsule Take 1 capsule (40 mg total) by mouth at bedtime. 05/24/16  Yes Nyra Market, MD  glimepiride (AMARYL) 4 MG tablet Take 1 tablet (4 mg total) by mouth daily with breakfast. 05/24/16  Yes Nyra Market, MD  glucose blood (ACCU-CHEK AVIVA PLUS) test strip 1 strip by Does not apply route 4 (four) times daily. 08/12/15  Yes Historical Provider, MD  hydrOXYzine (ATARAX/VISTARIL) 10 MG tablet Take 20 mg by mouth daily as needed for itching. Reported on 12/01/2015   Yes Historical Provider, MD  meclizine (ANTIVERT) 25 MG tablet Take 25 mg by mouth 4 (four) times daily as needed for dizziness.   Yes Historical Provider, MD  metFORMIN (GLUCOPHAGE) 500 MG tablet Take 1  tablet (500 mg total) by mouth daily with breakfast. 05/24/16  Yes Nyra Market, MD  oxyCODONE-acetaminophen (PERCOCET/ROXICET) 5-325 MG tablet Take 1 tablet by mouth 3 (three) times daily.   Yes Historical Provider, MD  pravastatin (PRAVACHOL) 40 MG tablet Take 1 tablet (40 mg total) by mouth daily. 05/24/16  Yes Nyra Market, MD  tiotropium (SPIRIVA) 18 MCG inhalation capsule Place 1 capsule (18 mcg total) into inhaler and inhale daily. 05/24/16  Yes Nyra Market, MD  valsartan (DIOVAN) 160 MG tablet Take 1 tablet (160 mg total) by mouth daily with breakfast. 05/24/16  Yes Nyra Market, MD  fenofibrate (TRICOR) 145 MG tablet Take 1 tablet (145 mg total) by mouth daily. 05/26/16   Vivianne Master, [name]     Objective:   Vitals:   05/26/16 1110  BP: (!) 138/91  Pulse: 69  Temp: 98 F (36.7 C)  TempSrc: Oral  SpO2: 95%  Weight: 158 lb 6.4  oz (71.8 kg)    Exam General appearance : Awake, alert, not in any distress. Speech Clear. Not toxic looking HEENT: Atraumatic and Normocephalic, pupils equally reactive to light and accomodation Neck: supple, no JVD. No cervical lymphadenopathy.  Chest:Good air entry bilaterally, no added sounds  CVS: S1 S2 regular, no murmurs.  Abdomen: Bowel sounds present, Non tender and not distended with no gaurding, rigidity or rebound. Extremities: B/L Lower Ext shows no edema, both legs are warm to touch Neurology: Awake alert, and oriented X 3, CN II-XII intact, Non focal Skin:No Rash Wounds:N/A  Data Review Lab Results  Component Value Date   HGBA1C 11.6 (H) 05/24/2016     Assessment & Plan  1. Atypical CP  -3 mo f/u with CARDS with echo  -medication mgmt  -RF modification  2. Chronic systolic CHF EF 25-30%  -As above  -Consider resynchronization therapy as indicated  3. HTN  -restart antihypertensives  -DASH diet  4. DM2 with hyperglycemia  -restart DM agents   -Aim for 30 minutes of exercise most days. Rethink what you  drink. Water is great! Aim for 2-3 Carb Choices per meal (30-45 grams) +/- 1 either way  Aim for 0-15 Carbs per snack if hungry  Include protein in moderation with your meals and snacks  Consider reading food labels for Total Carbohydrate and Fat Grams of foods  Consider checking BG at alternate times per day  Continue taking medication as directed Be mindful about how much sugar you are adding to beverages and other foods. Fruit Punch - find one with no sugar  Measure and decrease portions of carbohydrate foods  Make your plate and don't go back for seconds  5. Dyslipidemia/hypertriglyceridemia  -add Tricor  6. Smoker  -cessation discussed 7. Left wrist pain  -ortho referral  Return in about 1 week (around 06/02/2016).  The patient was given clear instructions to go to ER or return to medical center if symptoms don't improve, worsen or new problems develop. The patient verbalized understanding. The patient was told to call to get lab results if they haven't heard anything in the next week.   This note has been created with Education officer, environmentalDragon speech recognition software and smart phrase technology. Any transcriptional errors are unintentional.    Scot Juniffany Sean Malinowski, [name] [facility] [place], [place] [phone]   05/26/2016, 11:47 AM

## 2016-06-02 ENCOUNTER — Ambulatory Visit (INDEPENDENT_AMBULATORY_CARE_PROVIDER_SITE_OTHER): Payer: Self-pay | Admitting: Sports Medicine

## 2016-06-02 ENCOUNTER — Encounter: Payer: Self-pay | Admitting: Sports Medicine

## 2016-06-02 VITALS — BP 152/85 | Ht 67.0 in | Wt 157.0 lb

## 2016-06-02 DIAGNOSIS — M654 Radial styloid tenosynovitis [de Quervain]: Secondary | ICD-10-CM

## 2016-06-03 NOTE — Progress Notes (Signed)
   Subjective:    Patient ID: [name], female    DOB: 14-Nov-1960, 55 y.o.   MRN: [id number]  HPI chief complaint: Left wrist pain  Very pleasant 55 year old female comes in today complaining of 2 months of left wrist pain. She denies any trauma but rather describes a gradual onset of pain that is primarily along the radial aspect of the wrist. She will occasionally get radiating pain more diffusely across the dorsum of her forearm and wrist. Her pain is most noticeable with any sort of wrist movement. She has been placed into a thumb spica brace which has been only minimally helpful. She was also given a steroid Dosepak which was only minimally helpful. She denies any similar problems in the past. Recent x-rays of the left wrist have been unremarkable. She denies numbness and tingling. She has noticed some swelling along the radial aspect of the wrist as well.  Past medical history is reviewed Surgical history is also reviewed. It is significant for bilateral carpal tunnel releases. She has also had two A1 pulley releases as well as a right ankle ORIF. The ankle ORIF was done in 2014. She subsequently developed an infection in the hardware which led to osteomyelitis. Hardware was subsequently removed and she was on IV antibiotics for several weeks. Medications reviewed Allergies reviewed    Review of Systems    as above Objective:   Physical Exam  Well-developed, well-nourished. No acute distress. Sitting comfortably in the exam room. Vital signs reviewed  Left wrist: Patient has limited active and passive range of motion in all planes due to pain. Mild amount of swelling in the first extensor compartment is noted. No erythema. It is not warm to touch. Markedly positive Finkelstein's. Neurovascular intact distally.  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 She will also get some over-the-counter Aspercreme and apply liberally for the next week. She is unable to take oral NSAIDs. Follow-up in one week with Dr. Jordan Likes for ultrasound and ultrasound guided cortisone injection into her first extensor compartment. I explained to her that if her symptoms persist after the injection, then she may need to see an orthopedist about a first extensor compartment release.

## 2016-06-08 ENCOUNTER — Encounter: Payer: Self-pay | Admitting: Family Medicine

## 2016-06-08 ENCOUNTER — Ambulatory Visit (INDEPENDENT_AMBULATORY_CARE_PROVIDER_SITE_OTHER): Payer: Self-pay | Admitting: Family Medicine

## 2016-06-08 DIAGNOSIS — M654 Radial styloid tenosynovitis [de Quervain]: Secondary | ICD-10-CM

## 2016-06-08 MED ORDER — METHYLPREDNISOLONE ACETATE 40 MG/ML IJ SUSP
20.0000 mg | Freq: Once | INTRAMUSCULAR | Status: AC
  Administered 2016-06-08: 20 mg via INTRA_ARTICULAR

## 2016-06-09 DIAGNOSIS — M654 Radial styloid tenosynovitis [de Quervain]: Secondary | ICD-10-CM | POA: Insufficient documentation

## 2016-06-09 NOTE — Progress Notes (Signed)
  [name] - 55 y.o. female MRN [id number]  Date of birth: Nov 04, 1960  SUBJECTIVE:  Including CC & ROS.   [name] is a 55 year old female that is presenting with left first dorsal compartment wrist pain. This pain is occurring mainly at her wrist but does have some radiation proximally. She has been evaluated by Dr. Margaretha Sheffield and is presenting to have an injection area and  PHYSICAL EXAM:  VS: BP:(!) 154/87  HR: bpm  TEMP: ( )  RESP:   HT:5\' 7"  (170.2 cm)   WT:157 lb (71.2 kg)  BMI:24.6 PHYSICAL EXAM: Gen: NAD, alert, cooperative with exam,  HEENT: clear conjunctiva, EOMI CV:  no edema, capillary refill brisk,  Resp: non-labored, normal speech Skin: no rashes, normal turgor  Neuro: no gross deficits.  Psych:  alert and oriented Left wrist:  TTP of the left 1st Dorsal compartment  Normal wrist ROM  Normal strength to resistance of the wrist  Normal Thumb opposition  Normal thumb extension  Neurovascularly intact   Limited ultrasound: Left wrist: There is a mild edema and the first dorsal compartment that is mainly located at the radial styloid.   Aspiration/Injection Procedure Note FUTURE HAUS Sep 06, 1960  Procedure: Injection Indications: left wrist pain  Procedure Details Consent: Risks of procedure as well as the alternatives and risks of each were explained to the (patient/caregiver).  Consent for procedure obtained. Time Out: Verified patient identification, verified procedure, site/side was marked, verified correct patient position, special equipment/implants available, medications/allergies/relevent history reviewed, required imaging and test results available.  Performed.  The area was cleaned with iodine and alcohol swabs.    The left 1st dorsal compartment was injected using 0.5 cc's of 40 mg Depomedrol and 0.5 cc's of 1% lidocaine with a 25 5/8" needle.  Ultrasound was used.    A sterile dressing was applied.  Patient did tolerate procedure  well.  ASSESSMENT & PLAN:   Suzette Battiest disease (radial styloid tenosynovitis) Injection plan completed of the first dorsal compartment today. She will call 3 weeks to let us know how she is doing. If she does not have any improvement then may need to consider referral to surgery.

## 2016-06-09 NOTE — Assessment & Plan Note (Signed)
Injection plan completed of the first dorsal compartment today. She will call 3 weeks to let us know how she is doing. If she does not have any improvement then may need to consider referral to surgery.

## 2016-06-17 ENCOUNTER — Ambulatory Visit: Payer: Self-pay | Attending: Internal Medicine

## 2016-06-18 ENCOUNTER — Encounter (HOSPITAL_COMMUNITY): Payer: Self-pay | Admitting: *Deleted

## 2016-06-18 DIAGNOSIS — F17210 Nicotine dependence, cigarettes, uncomplicated: Secondary | ICD-10-CM | POA: Diagnosis not present

## 2016-06-18 DIAGNOSIS — I5022 Chronic systolic (congestive) heart failure: Secondary | ICD-10-CM | POA: Diagnosis not present

## 2016-06-18 DIAGNOSIS — Z7984 Long term (current) use of oral hypoglycemic drugs: Secondary | ICD-10-CM | POA: Insufficient documentation

## 2016-06-18 DIAGNOSIS — K0889 Other specified disorders of teeth and supporting structures: Secondary | ICD-10-CM | POA: Insufficient documentation

## 2016-06-18 DIAGNOSIS — E119 Type 2 diabetes mellitus without complications: Secondary | ICD-10-CM | POA: Diagnosis not present

## 2016-06-18 DIAGNOSIS — Z7982 Long term (current) use of aspirin: Secondary | ICD-10-CM | POA: Insufficient documentation

## 2016-06-18 DIAGNOSIS — J449 Chronic obstructive pulmonary disease, unspecified: Secondary | ICD-10-CM | POA: Diagnosis not present

## 2016-06-18 DIAGNOSIS — Z91040 Latex allergy status: Secondary | ICD-10-CM | POA: Diagnosis not present

## 2016-06-18 NOTE — ED Triage Notes (Signed)
Pt complains of pain in her right lower wisdom tooth for the past 2 days. Pt states the tooth broke last Wednesday.

## 2016-06-19 ENCOUNTER — Emergency Department (HOSPITAL_COMMUNITY)
Admission: EM | Admit: 2016-06-19 | Discharge: 2016-06-19 | Disposition: A | Discharge status: Home / Self Care | Payer: Medicaid Other | Attending: Emergency Medicine | Admitting: Emergency Medicine

## 2016-06-19 DIAGNOSIS — K0889 Other specified disorders of teeth and supporting structures: Secondary | ICD-10-CM

## 2016-06-19 MED ORDER — OXYCODONE-ACETAMINOPHEN 5-325 MG PO TABS
2.0000 | ORAL_TABLET | Freq: Once | ORAL | Status: AC
  Administered 2016-06-19: 2 via ORAL
  Filled 2016-06-19: qty 2

## 2016-06-19 MED ORDER — OXYCODONE-ACETAMINOPHEN 5-325 MG PO TABS: 1.0000 | ORAL_TABLET | ORAL | 0 refills | Status: AC | PRN

## 2016-06-19 NOTE — Discharge Instructions (Addendum)
You have been prescribed percocet for dental pain.  Please take this as prescribed for pain.  You may take tylenol for the the dental pain as well in the morning, afternoon and night.   It is important that you follow up with a dentist, your tooth likely needs to be extracted.    Return to emergency room if you develop neck stiffness, difficulty controlling your secretions or fevers.

## 2016-06-19 NOTE — ED Provider Notes (Signed)
[facility]-EMERGENCY DEPT Provider Note   CSN: [id number] Arrival date & time: 06/18/16  2142     History   Chief Complaint Chief Complaint  Patient presents with  . Dental Pain    HPI [name] is a 55 y.o. female with pmh of systolic heart failure (EF 20-25%), DM, HTN, atrial fibrillation presents with R dental pain associated with R lower jaw pain x 1 day.  Pt states she noticed her R wisdom tooth was cracked when she was brushing her teeth three days ago, tooth started hurting this morning.  Pt has tried taking extra strength tylenol and orogel which has provided minimal pain relief.  Pt last saw dentist 1 year ago.    Pt denies fevers, lock jaw, difficulty breathing or controlling oral secretions, neck pain or stiffness. Pt states she got her orange card recently, she is establishing care with [facility].  Pt states she plans on calling dentist Tuesday morning.  Pt concerned her dental pain will worsen over the weekend/christmas until she is able to see a dentist next week.  Pt takes percocet at home for pain, states her prescription has ran out.   HPI  Past Medical History:  Diagnosis Date  . Anxiety   . Arthritis   . Chronic systolic heart failure (HCC)    EF 20-25% ECHO 05/2015  . COPD (chronic obstructive pulmonary disease) (HCC)   . Depression   . Diabetes mellitus without complication (HCC)    Type II  . Dysrhythmia    pt unsure of name of arrythmia - " my heart rate will drop all of a sudden" -no current treatment - atrial flutter  . Fibrillation, atrial (HCC)   . Gallstones   . GERD (gastroesophageal reflux disease)   . Headache(784.0)    migraines  . Hypertension   . Osteomyelitis (HCC)    R ankle  . Rash    arms    Patient Active Problem List   Diagnosis Date Noted  . De Quervain's disease (radial styloid tenosynovitis) 06/09/2016  . Tobacco abuse 05/23/2016  . Hyperglycemia without ketosis   . Bleeding hemorrhoids 11/03/2015    . Acute blood loss anemia 11/03/2015  . Hematochezia 11/01/2015  . Back pain with sciatica 09/08/2015  . Infection of bone of ankle (HCC) 07/15/2015  . Chronic systolic CHF (congestive heart failure) (HCC) 06/24/2015  . Chest pain 06/03/2015  . Hypertension   . Anxiety   . Gallstones   . GERD (gastroesophageal reflux disease)   . COPD (chronic obstructive pulmonary disease) (HCC)   . Dysrhythmia   . Rash   . Arthritis   . Depression   . Diabetes mellitus without complication (HCC)   . Post-operative state 12/10/2011    Past Surgical History:  Procedure Laterality Date  . ANKLE FRACTURE SURGERY Right 2015  . CARDIAC CATHETERIZATION N/A 06/24/2015   Procedure: Left Heart Cath and Coronary Angiography;  Surgeon: [name], MD;  Location: [facility] INVASIVE CV LAB;  Service: Cardiovascular;  Laterality: N/A;  . CARPAL TUNNEL RELEASE     bil  . CHOLECYSTECTOMY  11/24/2011   Procedure: LAPAROSCOPIC CHOLECYSTECTOMY WITH INTRAOPERATIVE CHOLANGIOGRAM;  Surgeon: Clovis Pu. Cornett, MD;  Location: [facility] ORS;  Service: General;  Laterality: N/A;  Laparoscopic Cholecystectomy with Cholangiogram  . ECTOPIC PREGNANCY SURGERY  many yrs ago  . HARDWARE REMOVAL Right 07/15/2015   Procedure: RIGHT ANKLE HARDWARE REMOVAL, PLACEMENT OF STIMULAN ANTIBIOTIC BEADS AND PREVENA WOUND VAC.;  Surgeon: [name],  MD;  Location: [facility] OR;  Service: Orthopedics;  Laterality: Right;    OB History    Gravida Para Term Preterm AB Living   9       5 4    SAB TAB Ectopic Multiple Live Births   4   1   4        Home Medications    Prior to Admission medications   Medication Sig Start Date End Date Taking? Authorizing Provider  acetaminophen (TYLENOL) 500 MG tablet Take 1,000 mg by mouth daily as needed (pain).    Historical Provider, MD  albuterol (PROVENTIL HFA;VENTOLIN HFA) 108 (90 Base) MCG/ACT inhaler Inhale 2 puffs into the lungs every 6 (six) hours as needed for wheezing or shortness of breath. For  wheezing 05/25/16   Nyra MarketGorica Svalina, MD  ALPRAZolam [name](XANAX) 0.5 MG tablet Take 1 mg by mouth 2 (two) times daily. Anxiety    Historical Provider, MD  aspirin 81 MG EC tablet Take 1 tablet (81 mg total) by mouth daily. 05/25/16   Nyra MarketGorica Svalina, MD  bisoprolol (ZEBETA) 5 MG tablet Take 0.5 tablets (2.5 mg total) by mouth daily. 05/24/16   Nyra MarketGorica Svalina, MD  esomeprazole (NEXIUM) 20 MG capsule Take 1 capsule (20 mg total) by mouth daily. 05/24/16   Nyra MarketGorica Svalina, MD  fenofibrate (TRICOR) 145 MG tablet Take 1 tablet (145 mg total) by mouth daily. 05/26/16   Tiffany Netta CedarsS Noel, [name]  FLUoxetine (PROZAC) 40 MG capsule Take 1 capsule (40 mg total) by mouth at bedtime. 05/24/16   Nyra MarketGorica Svalina, MD  glimepiride (AMARYL) 4 MG tablet Take 1 tablet (4 mg total) by mouth daily with breakfast. 05/24/16   Nyra MarketGorica Svalina, MD  glucose blood (ACCU-CHEK AVIVA PLUS) test strip 1 strip by Does not apply route 4 (four) times daily. 08/12/15   Historical Provider, MD  hydrOXYzine (ATARAX/VISTARIL) 10 MG tablet Take 20 mg by mouth daily as needed for itching. Reported on 12/01/2015    Historical Provider, MD  meclizine (ANTIVERT) 25 MG tablet Take 25 mg by mouth 4 (four) times daily as needed for dizziness.    Historical Provider, MD  metFORMIN (GLUCOPHAGE) 500 MG tablet Take 1 tablet (500 mg total) by mouth daily with breakfast. 05/24/16   Nyra MarketGorica Svalina, MD  oxyCODONE-acetaminophen (PERCOCET/ROXICET) 5-325 MG tablet Take 1 tablet by mouth every 4 (four) hours as needed for severe pain. 06/19/16 06/22/16  Liberty Handylaudia J Chelle Cayton, [name]  pravastatin (PRAVACHOL) 40 MG tablet Take 1 tablet (40 mg total) by mouth daily. 05/24/16   Nyra MarketGorica Svalina, MD  tiotropium (SPIRIVA) 18 MCG inhalation capsule Place 1 capsule (18 mcg total) into inhaler and inhale daily. 05/24/16   Nyra MarketGorica Svalina, MD  valsartan (DIOVAN) 160 MG tablet Take 1 tablet (160 mg total) by mouth daily with breakfast. 05/24/16   Nyra MarketGorica Svalina, MD    Family History Family  History  Problem Relation Age of Onset  . Lung cancer Father   . Heart attack Father [id number]    CABG x4  . Breast cancer Sister     Survivor     Social History Social History  Substance Use Topics  . Smoking status: Current Every Day Smoker    Packs/day: 0.50    Years: 20.00    Types: Cigarettes  . Smokeless tobacco: Never Used  . Alcohol use No     Allergies   Biaxin [clarithromycin]; Clarithromycin; Lisinopril; Sulfa antibiotics; Chlorthalidone; Daptomycin; Amoxicillin-pot clavulanate; Aspirin; Cholestyramine; Dilaudid [hydromorphone hcl]; Lasix [furosemide]; and Latex   Review of Systems  Review of Systems  Constitutional: Negative for chills and fever.  HENT: Positive for dental problem. Negative for congestion, drooling, ear pain, facial swelling, sore throat, tinnitus, trouble swallowing and voice change.   Eyes: Negative for visual disturbance.  Respiratory: Negative for choking, shortness of breath and stridor.   Cardiovascular: Negative for chest pain.  Gastrointestinal: Negative for abdominal pain and nausea.  Genitourinary: Negative for difficulty urinating.  Musculoskeletal: Negative for arthralgias, back pain, neck pain and neck stiffness.  Skin: Negative for rash.  Neurological: Positive for headaches. Negative for dizziness, syncope, facial asymmetry, light-headedness and numbness.  Psychiatric/Behavioral: Negative.      Physical Exam Updated Vital Signs BP 163/93   Pulse (!) 52   Temp 98.3 F (36.8 C) (Oral)   Resp 18   Wt 69.4 kg   LMP 06/09/2011 Comment: verified BEFORE imaging  SpO2 97%   BMI 23.96 kg/m   Physical Exam  Constitutional: She is oriented to person, place, and time. Vital signs are normal. She appears well-developed and well-nourished. No distress.  HENT:  Head: Normocephalic and atraumatic.  Nose: Nose normal.  Mouth/Throat: Oropharynx is clear and moist. No oropharyngeal exudate.  Poor dentition.  Posterior R tooth is cracked.   There is mild gum line tenderness, erythema and edema.  No gum line fluctuance.  No obvious signs of dental abscess.  No sublingual edema or tenderness.  Uvula midline. Oropharynx and tonsils pink without edema or erythema or exudates. No tinnitus. Normal phonation, no hot potato voice.   Eyes: EOM are normal. Pupils are equal, round, and reactive to light.  Neck: Normal range of motion.  Full neck ROM, no midline cervical tenderness.   Cardiovascular: Normal rate.   Pulmonary/Chest: Effort normal.  Musculoskeletal: Normal range of motion.  Neurological: She is alert and oriented to person, place, and time.  Skin: Skin is warm and dry.  Psychiatric: She has a normal mood and affect. Her behavior is normal.  Nursing note and vitals reviewed.    ED Treatments / Results  Labs (all labs ordered are listed, but only abnormal results are displayed) Labs Reviewed - No data to display  EKG  EKG Interpretation None       Radiology No results found.  Procedures Procedures (including critical care time)  Medications Ordered in ED Medications  oxyCODONE-acetaminophen (PERCOCET/ROXICET) 5-325 MG per tablet 2 tablet (2 tablets Oral Given 06/19/16 0110)     Initial Impression / Assessment and Plan / ED Course  I have reviewed the triage vital signs and the nursing notes.  Pertinent labs & imaging results that were available during my care of the patient were reviewed by me and considered in my medical decision making (see chart for details).  Clinical Course    Dental pain associated with dental cary but no signs or symptoms of dental abscess with patient afebrile, non toxic appearing and swallowing secretions well. Exam unconcerning for Ludwig's angina or other deep tissue infection in neck.  As there is mild gum edema, tenderness and erythema associated with facial pain, will treat with antibiotic and pain medicine. Urged patient to follow-up with dentist.  Pt states she has contact  information for a dentist she plans to call next week.  I stressed the importance of dental follow up for ultimate management of dental pain. Patient voices understanding and is agreeable to plan.  Final Clinical Impressions(s) / ED Diagnoses   Final diagnoses:  Pain, dental    New Prescriptions Discharge Medication List as of  06/19/2016 12:56 AM       Liberty Handy, [name] 06/21/16 1333    Nira Conn, MD 06/22/16 [phone]

## 2016-06-23 MED FILL — OXYCODONE W/APAP 5/325 TAB: 5-325 | 30 days supply | Qty: 90 | Fill #0

## 2016-06-23 MED FILL — ALPRAZolam 0.5 MG TABS: 0.5 | 30 days supply | Qty: 120 | Fill #0

## 2016-06-25 ENCOUNTER — Ambulatory Visit: Payer: Medicaid Other | Attending: Family Medicine | Admitting: Family Medicine

## 2016-06-25 ENCOUNTER — Encounter: Payer: Self-pay | Admitting: Family Medicine

## 2016-06-25 VITALS — BP 144/80 | HR 58 | Temp 98.0°F | Ht 67.0 in | Wt 159.0 lb

## 2016-06-25 DIAGNOSIS — Z9049 Acquired absence of other specified parts of digestive tract: Secondary | ICD-10-CM | POA: Diagnosis not present

## 2016-06-25 DIAGNOSIS — L299 Pruritus, unspecified: Secondary | ICD-10-CM | POA: Insufficient documentation

## 2016-06-25 DIAGNOSIS — I110 Hypertensive heart disease with heart failure: Secondary | ICD-10-CM | POA: Insufficient documentation

## 2016-06-25 DIAGNOSIS — Z79899 Other long term (current) drug therapy: Secondary | ICD-10-CM | POA: Diagnosis not present

## 2016-06-25 DIAGNOSIS — Z7982 Long term (current) use of aspirin: Secondary | ICD-10-CM | POA: Diagnosis not present

## 2016-06-25 DIAGNOSIS — Z9889 Other specified postprocedural states: Secondary | ICD-10-CM | POA: Diagnosis not present

## 2016-06-25 DIAGNOSIS — K219 Gastro-esophageal reflux disease without esophagitis: Secondary | ICD-10-CM

## 2016-06-25 DIAGNOSIS — K0889 Other specified disorders of teeth and supporting structures: Secondary | ICD-10-CM

## 2016-06-25 DIAGNOSIS — I5022 Chronic systolic (congestive) heart failure: Secondary | ICD-10-CM | POA: Insufficient documentation

## 2016-06-25 DIAGNOSIS — H8110 Benign paroxysmal vertigo, unspecified ear: Secondary | ICD-10-CM | POA: Insufficient documentation

## 2016-06-25 DIAGNOSIS — I10 Essential (primary) hypertension: Secondary | ICD-10-CM

## 2016-06-25 DIAGNOSIS — E119 Type 2 diabetes mellitus without complications: Secondary | ICD-10-CM | POA: Insufficient documentation

## 2016-06-25 DIAGNOSIS — Z7984 Long term (current) use of oral hypoglycemic drugs: Secondary | ICD-10-CM | POA: Insufficient documentation

## 2016-06-25 DIAGNOSIS — H8113 Benign paroxysmal vertigo, bilateral: Secondary | ICD-10-CM

## 2016-06-25 DIAGNOSIS — Z23 Encounter for immunization: Secondary | ICD-10-CM

## 2016-06-25 LAB — GLUCOSE, POCT (MANUAL RESULT ENTRY): POC Glucose: 180 mg/dl — AB (ref 70–99)

## 2016-06-25 MED ORDER — ACETAMINOPHEN-CODEINE #3 300-30 MG PO TABS: 1.0000 | ORAL_TABLET | Freq: Two times a day (BID) | ORAL | 0 refills | Status: DC | PRN

## 2016-06-25 MED ORDER — TRUE METRIX METER DEVI: 1.0000 | Freq: Three times a day (TID) | 0 refills | Status: DC

## 2016-06-25 MED ORDER — HYDROXYZINE HCL 10 MG PO TABS: 20.0000 mg | ORAL_TABLET | Freq: Every day | ORAL | 3 refills | Status: DC | PRN

## 2016-06-25 MED ORDER — TRUEPLUS LANCETS 28G MISC: 1.0000 | Freq: Three times a day (TID) | 12 refills | Status: DC

## 2016-06-25 MED ORDER — AMOXICILLIN 500 MG PO CAPS: 500.0000 mg | ORAL_CAPSULE | Freq: Three times a day (TID) | ORAL | 0 refills | Status: DC

## 2016-06-25 MED ORDER — MECLIZINE HCL 25 MG PO TABS: 25.0000 mg | ORAL_TABLET | Freq: Three times a day (TID) | ORAL | 3 refills | Status: DC | PRN

## 2016-06-25 MED ORDER — GLUCOSE BLOOD VI STRP: ORAL_STRIP | 12 refills | Status: DC

## 2016-06-25 MED FILL — TRUEplus LANCETS 28G MISC: 30 days supply | Qty: 100 | Fill #0

## 2016-06-25 MED FILL — hydrOXYzine HCL 10 MG TABS: 10 | 30 days supply | Qty: 60 | Fill #0

## 2016-06-25 MED FILL — GLIMEPIRIDE 4 MG TABLET: 4 | 30 days supply | Qty: 30 | Fill #1

## 2016-06-25 MED FILL — TRAVEL SICKNESS 25 MG TAB C: 25 | 20 days supply | Qty: 60 | Fill #0

## 2016-06-25 MED FILL — ?AMOXICILLIN 500 MG CAPSULE: 500 | 10 days supply | Qty: 30 | Fill #0

## 2016-06-25 MED FILL — ACETAMINOPHEN/COD #3 TABLET: 300-30 | 10 days supply | Qty: 30 | Fill #0

## 2016-06-25 MED FILL — TRUE METRIX TEST STRIP: 30 days supply | Qty: 100 | Fill #0

## 2016-06-25 MED FILL — TRUE METRIX BLOOD GLUCOSE M: W/DEVICE | 1 days supply | Qty: 1 | Fill #0

## 2016-06-25 MED FILL — FLUoxetine HCL 40 MG CAPS: 40 | 30 days supply | Qty: 30 | Fill #1

## 2016-06-25 MED FILL — BISOPROLOL FUMARATE 5 MG TA: 5 | 30 days supply | Qty: 15 | Fill #1

## 2016-06-25 NOTE — Progress Notes (Signed)
Need diabetic strips, hydroxyzine, meclizine

## 2016-06-25 NOTE — Patient Instructions (Signed)
Dental Pain Dental pain may be caused by many things, including:  Tooth decay (cavities or caries). Cavities expose the nerve of your tooth to air and hot or cold temperatures. This can cause pain or discomfort.  Abscess or infection. A dental abscess is a collection of infected pus from a bacterial infection in the inner part of the tooth (pulp). It usually occurs at the end of the tooth's root.  Injury.  An unknown reason (idiopathic). Your pain may be mild or severe. It may only occur when:  You are chewing.  You are exposed to hot or cold temperature.  You are eating or drinking sugary foods or beverages, such as soda or candy. Your pain may also be constant. Follow these instructions at home: Watch your dental pain for any changes. The following actions may help to lessen any discomfort that you are feeling:  Take medicines only as directed by your dentist.  If you were prescribed an antibiotic medicine, finish all of it even if you start to feel better.  Keep all follow-up visits as directed by your dentist. This is important.  Do not apply heat to the outside of your face.  Rinse your mouth or gargle with salt water if directed by your dentist. This helps with pain and swelling.  You can make salt water by adding  tsp of salt to 1 cup of warm water.  Apply ice to the painful area of your face:  Put ice in a plastic bag.  Place a towel between your skin and the bag.  Leave the ice on for 20 minutes, 2-3 times per day.  Avoid foods or drinks that cause you pain, such as:  Very hot or very cold foods or drinks.  Sweet or sugary foods or drinks. Contact a health care provider if:  Your pain is not controlled with medicines.  Your symptoms are worse.  You have new symptoms. Get help right away if:  You are unable to open your mouth.  You are having trouble breathing or swallowing.  You have a fever.  Your face, neck, or jaw is swollen. This information  is not intended to replace advice given to you by your health care provider. Make sure you discuss any questions you have with your health care provider. Document Released: 06/14/2005 Document Revised: 10/23/2015 Document Reviewed: 06/10/2014 Elsevier Interactive Patient Education  2017 Elsevier Inc.   

## 2016-06-25 NOTE — Progress Notes (Signed)
Subjective:  Patient ID: [name], female    DOB: 02-16-1961  Age: 56 y.o. MRN: [id number]  CC: Diabetes and Dental Pain (cracked back right bottom wisdom tooth)   HPI [name] is a 55 year old female with a history of type 2 diabetes mellitus (A1c 11.6), hypertension, CHF (EF 25%-30) who presents today with complaints of pain in a cracked tooth in her right lower jaw. She complains that her teeth started going bad after she received medications from a PICC line for an ankle joint infection. She denies swelling of her gum or fevers.  She has been compliant with all her medications and denies shortness of breath or chest pains.  She requests refill of hydroxyzine which she takes for pruritus and meclizine which she takes for vertigo.  Her blood sugars at home have been in the 110 to 130 range and she denies hypoglycemia.  Past Medical History:  Diagnosis Date  . Anxiety   . Arthritis   . Chronic systolic heart failure (HCC)    EF 20-25% ECHO 05/2015  . COPD (chronic obstructive pulmonary disease) (HCC)   . Depression   . Diabetes mellitus without complication (HCC)    Type II  . Dysrhythmia    pt unsure of name of arrythmia - " my heart rate will drop all of a sudden" -no current treatment - atrial flutter  . Fibrillation, atrial (HCC)   . Gallstones   . GERD (gastroesophageal reflux disease)   . Headache(784.0)    migraines  . Hypertension   . Osteomyelitis (HCC)    R ankle  . Rash    arms    Past Surgical History:  Procedure Laterality Date  . ANKLE FRACTURE SURGERY Right 2015  . CARDIAC CATHETERIZATION N/A 06/24/2015   Procedure: Left Heart Cath and Coronary Angiography;  Surgeon: [name], MD;  Location: [facility] INVASIVE CV LAB;  Service: Cardiovascular;  Laterality: N/A;  . CARPAL TUNNEL RELEASE     bil  . CHOLECYSTECTOMY  11/24/2011   Procedure: LAPAROSCOPIC CHOLECYSTECTOMY WITH INTRAOPERATIVE CHOLANGIOGRAM;  Surgeon: Clovis Pu. Cornett, MD;   Location: [facility] ORS;  Service: General;  Laterality: N/A;  Laparoscopic Cholecystectomy with Cholangiogram  . ECTOPIC PREGNANCY SURGERY  many yrs ago  . HARDWARE REMOVAL Right 07/15/2015   Procedure: RIGHT ANKLE HARDWARE REMOVAL, PLACEMENT OF STIMULAN ANTIBIOTIC BEADS AND PREVENA WOUND VAC.;  Surgeon: [name], MD;  Location: [facility] OR;  Service: Orthopedics;  Laterality: Right;    Allergies  Allergen Reactions  . Biaxin [Clarithromycin] Anaphylaxis and Swelling  . Clarithromycin Shortness Of Breath and Swelling  . Lisinopril Swelling and Cough    Lip edema  . Sulfa Antibiotics Anaphylaxis, Shortness Of Breath, Swelling and Hypertension  . Chlorthalidone Nausea And Vomiting and Other (See Comments)    Clammy, Tachycardia, Headache   . Daptomycin Nausea And Vomiting  . Amoxicillin-Pot Clavulanate Diarrhea    Has patient had a PCN reaction causing immediate rash, facial/tongue/throat swelling, SOB or lightheadedness with hypotension:No Has patient had a PCN reaction causing severe rash involving mucus membranes or skin necrosis:No Has patient had a PCN reaction that required hospitalization:No Has patient had a PCN reaction occurring within the last 10 years:Yes If all of the above answers are "NO", then may proceed with Cephalosporin use.   . Aspirin Nausea And Vomiting and Rash  . Cholestyramine Nausea And Vomiting  . Dilaudid [Hydromorphone Hcl] Nausea And Vomiting  . Lasix [Furosemide] Nausea And Vomiting and Other (See Comments)  Headache   . Latex Hives and Rash     Outpatient Medications Prior to Visit  Medication Sig Dispense Refill  . acetaminophen (TYLENOL) 500 MG tablet Take 1,000 mg by mouth daily as needed (pain).    Marland Kitchen albuterol (PROVENTIL HFA;VENTOLIN HFA) 108 (90 Base) MCG/ACT inhaler Inhale 2 puffs into the lungs every 6 (six) hours as needed for wheezing or shortness of breath. For wheezing 1 Inhaler 11  . ALPRAZolam (XANAX) 0.5 MG tablet Take 1 mg by mouth 2  (two) times daily. Anxiety    . aspirin 81 MG EC tablet Take 1 tablet (81 mg total) by mouth daily. 30 tablet 2  . bisoprolol (ZEBETA) 5 MG tablet Take 0.5 tablets (2.5 mg total) by mouth daily. 30 tablet 5  . esomeprazole (NEXIUM) 20 MG capsule Take 1 capsule (20 mg total) by mouth daily. 30 capsule 2  . fenofibrate (TRICOR) 145 MG tablet Take 1 tablet (145 mg total) by mouth daily. 30 tablet 3  . FLUoxetine (PROZAC) 40 MG capsule Take 1 capsule (40 mg total) by mouth at bedtime. 90 capsule 3  . glimepiride (AMARYL) 4 MG tablet Take 1 tablet (4 mg total) by mouth daily with breakfast. 30 tablet 2  . glucose blood (ACCU-CHEK AVIVA PLUS) test strip 1 strip by Does not apply route 4 (four) times daily.    . metFORMIN (GLUCOPHAGE) 500 MG tablet Take 1 tablet (500 mg total) by mouth daily with breakfast. 90 tablet 3  . pravastatin (PRAVACHOL) 40 MG tablet Take 1 tablet (40 mg total) by mouth daily. 30 tablet 2  . tiotropium (SPIRIVA) 18 MCG inhalation capsule Place 1 capsule (18 mcg total) into inhaler and inhale daily. 30 capsule 12  . valsartan (DIOVAN) 160 MG tablet Take 1 tablet (160 mg total) by mouth daily with breakfast. 90 tablet 3  . hydrOXYzine (ATARAX/VISTARIL) 10 MG tablet Take 20 mg by mouth daily as needed for itching. Reported on 12/01/2015    . meclizine (ANTIVERT) 25 MG tablet Take 25 mg by mouth 4 (four) times daily as needed for dizziness.     No facility-administered medications prior to visit.     ROS Review of Systems  Constitutional: Negative for activity change, appetite change and fatigue.  HENT: Positive for dental problem. Negative for congestion, sinus pressure and sore throat.   Eyes: Negative for visual disturbance.  Respiratory: Negative for cough, chest tightness, shortness of breath and wheezing.   Cardiovascular: Negative for chest pain and palpitations.  Gastrointestinal: Negative for abdominal distention, abdominal pain and constipation.  Endocrine: Negative  for polydipsia.  Genitourinary: Negative for dysuria and frequency.  Musculoskeletal: Negative for arthralgias and back pain.  Skin: Negative for rash.  Neurological: Negative for tremors, light-headedness and numbness.  Hematological: Does not bruise/bleed easily.  Psychiatric/Behavioral: Negative for agitation and behavioral problems.    Objective:  BP (!) 144/80 (BP Location: Right Arm, Patient Position: Sitting, Cuff Size: Small)   Pulse (!) 58   Temp 98 F (36.7 C) (Oral)   Ht 5\' 7"  (1.702 m)   Wt 159 lb (72.1 kg)   LMP 06/09/2011 Comment: verified BEFORE imaging  SpO2 98%   BMI 24.90 kg/m   BP/Weight 06/25/2016 06/19/2016 06/18/2016  Systolic BP 144 163 -  Diastolic BP 80 93 -  Wt. (Lbs) 159 - 153  BMI 24.9 23.96 -      Physical Exam  Constitutional: She is oriented to person, place, and time. She appears well-developed and well-nourished.  HENT:  Multiple dental caries, cracked right lower molar  Cardiovascular: Normal heart sounds and intact distal pulses.  Bradycardia present.   No murmur heard. Pulmonary/Chest: Effort normal and breath sounds normal. She has no wheezes. She has no rales. She exhibits no tenderness.  Abdominal: Soft. Bowel sounds are normal. She exhibits no distension and no mass. There is tenderness (mild epigastric tenderness).  Musculoskeletal: Normal range of motion.  Neurological: She is alert and oriented to person, place, and time.  Skin: Skin is warm and dry.  Psychiatric: She has a normal mood and affect.    Lab Results  Component Value Date   HGBA1C 11.6 (H) 05/24/2016    Assessment & Plan:   1. Diabetes mellitus without complication (HCC) Uncontrolled with A1c of 11.6 CBG reveals improvement No regimen changed today ADA diet - Glucose (CBG) - TRUEPLUS LANCETS 28G MISC; 1 each by Does not apply route 3 (three) times daily before meals.  Dispense: 100 each; Refill: 12 - Blood Glucose Monitoring Suppl (TRUE METRIX METER)  DEVI; 1 each by Does not apply route 3 (three) times daily before meals.  Dispense: 1 Device; Refill: 0 - glucose blood (TRUE METRIX BLOOD GLUCOSE TEST) test strip; Use 3 times daily before meals  Dispense: 100 each; Refill: 12 - Lipid Panel w/reflex Direct LDL; Future - COMPLETE METABOLIC PANEL WITH GFR; Future  2. Tooth ache - amoxicillin (AMOXIL) 500 MG capsule; Take 1 capsule (500 mg total) by mouth 3 (three) times daily.  Dispense: 30 capsule; Refill: 0 - acetaminophen-codeine (TYLENOL #3) 300-30 MG tablet; Take 1 tablet by mouth every 12 (twelve) hours as needed for moderate pain.  Dispense: 30 tablet; Refill: 0 - Ambulatory referral to Dentistry  3. Chronic systolic CHF (congestive heart failure) (HCC) EF 25% -30% from 2-D echo of 05/2015 Euvolemic Continue medications Has a primary cardiology early next year  4. Gastroesophageal reflux disease, esophagitis presence not specified Does experience some epigastric pain Refuses initiation of Carafate Continue PPI and avoid recumbency up to 2 hours after meals. I will reassess at next visit   5. Pruritus  hydrOXYzine (ATARAX/VISTARIL) 10 MG tablet; Take 2 tablets (20 mg total) by mouth daily as needed for itching.  Dispense: 60 tablet; Refill: 3  6. 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  Dispense: 60 tablet; Refill: 3  7. Hypertension Slight elevation could be secondary to pain No regimen change  Meds ordered this encounter  Medications  . TRUEPLUS LANCETS 28G MISC    Sig: 1 each by Does not apply route 3 (three) times daily before meals.    Dispense:  100 each    Refill:  12  . Blood Glucose Monitoring Suppl (TRUE METRIX METER) DEVI    Sig: 1 each by Does not apply route 3 (three) times daily before meals.    Dispense:  1 Device    Refill:  0  . glucose blood (TRUE  METRIX BLOOD GLUCOSE TEST) test strip    Sig: Use 3 times daily before meals    Dispense:  100 each    Refill:  12  . amoxicillin (AMOXIL) 500 MG capsule    Sig: Take 1 capsule (500 mg total) by mouth 3 (three) times daily.    Dispense:  30 capsule    Refill:  0  . acetaminophen-codeine (TYLENOL #3) 300-30 MG tablet  Sig: Take 1 tablet by mouth every 12 (twelve) hours as needed for moderate pain.    Dispense:  30 tablet    Refill:  0  . hydrOXYzine (ATARAX/VISTARIL) 10 MG tablet    Sig: Take 2 tablets (20 mg total) by mouth daily as needed for itching.    Dispense:  60 tablet    Refill:  3  . meclizine (ANTIVERT) 25 MG tablet    Sig: Take 1 tablet (25 mg total) by mouth 3 (three) times daily as needed for dizziness.    Dispense:  60 tablet    Refill:  3    Follow-up: Return in about 3 months (around 09/23/2016) for Follow-up on diabetes mellitus and congestive heart failure.   Jaclyn ShaggyEnobong Amao MD

## 2016-06-30 MED FILL — ?METFORMIN HCL 500MG TABLET: 500 | 30 days supply | Qty: 30 | Fill #1

## 2016-06-30 MED FILL — PRAVASTATIN NA 40 MG TAB: 40 | 30 days supply | Qty: 30 | Fill #1

## 2016-06-30 MED FILL — VALSARTAN 160 MG TABLET: 160 | 30 days supply | Qty: 30 | Fill #1

## 2016-07-01 ENCOUNTER — Other Ambulatory Visit: Payer: Self-pay

## 2016-07-05 ENCOUNTER — Ambulatory Visit: Payer: Medicaid Other | Attending: Family Medicine

## 2016-07-05 DIAGNOSIS — E119 Type 2 diabetes mellitus without complications: Secondary | ICD-10-CM

## 2016-07-05 LAB — LIPID PANEL W/REFLEX DIRECT LDL
CHOL/HDL RATIO: 5.1 ratio — AB (ref <5.0)
Cholesterol: 144 mg/dL (ref <200)
HDL: 28 mg/dL — ABNORMAL LOW (ref >50)
LDL-CHOLESTEROL: 94 mg/dL
NON-HDL CHOLESTEROL (CALC): 116 mg/dL (ref <130)
Triglycerides: 120 mg/dL (ref <150)

## 2016-07-05 LAB — COMPLETE METABOLIC PANEL WITH GFR
ALT: 16 U/L (ref 6–29)
AST: 15 U/L (ref 10–35)
Albumin: 4.1 g/dL (ref 3.6–5.1)
Alkaline Phosphatase: 91 U/L (ref 33–130)
BUN: 7 mg/dL (ref 7–25)
CHLORIDE: 105 mmol/L (ref 98–110)
CO2: 28 mmol/L (ref 20–31)
Calcium: 9.1 mg/dL (ref 8.6–10.4)
Creat: 0.59 mg/dL (ref 0.50–1.05)
GFR, Est African American: >89 mL/min (ref >=60)
GLUCOSE: 162 mg/dL — AB (ref 65–99)
POTASSIUM: 3.6 mmol/L (ref 3.5–5.3)
Sodium: 141 mmol/L (ref 135–146)
Total Bilirubin: 0.5 mg/dL (ref 0.2–1.2)
Total Protein: 6.8 g/dL (ref 6.1–8.1)

## 2016-07-05 NOTE — Progress Notes (Signed)
Patient here for lab visit only 

## 2016-07-08 ENCOUNTER — Telehealth: Payer: Self-pay

## 2016-07-08 NOTE — Telephone Encounter (Signed)
Writer called patient with test results.  Patient stated understanding.

## 2016-07-08 NOTE — Telephone Encounter (Signed)
-----   Message from Jaclyn Shaggy, MD sent at 07/06/2016 12:26 PM EST ----- Total cholesterol, electrolytes are normal; good cholesterol (HDL) is low and this can be increased by exercise and low cholesterol diet.

## 2016-07-11 ENCOUNTER — Encounter (HOSPITAL_COMMUNITY): Payer: Self-pay | Admitting: *Deleted

## 2016-07-11 ENCOUNTER — Emergency Department (HOSPITAL_COMMUNITY)
Admission: EM | Admit: 2016-07-11 | Discharge: 2016-07-11 | Disposition: A | Discharge status: Home / Self Care | Payer: Medicaid Other | Attending: Emergency Medicine | Admitting: Emergency Medicine

## 2016-07-11 DIAGNOSIS — F17210 Nicotine dependence, cigarettes, uncomplicated: Secondary | ICD-10-CM | POA: Diagnosis not present

## 2016-07-11 DIAGNOSIS — Z7982 Long term (current) use of aspirin: Secondary | ICD-10-CM | POA: Diagnosis not present

## 2016-07-11 DIAGNOSIS — J449 Chronic obstructive pulmonary disease, unspecified: Secondary | ICD-10-CM | POA: Diagnosis not present

## 2016-07-11 DIAGNOSIS — Z7984 Long term (current) use of oral hypoglycemic drugs: Secondary | ICD-10-CM | POA: Diagnosis not present

## 2016-07-11 DIAGNOSIS — E119 Type 2 diabetes mellitus without complications: Secondary | ICD-10-CM | POA: Insufficient documentation

## 2016-07-11 DIAGNOSIS — I110 Hypertensive heart disease with heart failure: Secondary | ICD-10-CM | POA: Diagnosis not present

## 2016-07-11 DIAGNOSIS — K0889 Other specified disorders of teeth and supporting structures: Secondary | ICD-10-CM | POA: Diagnosis present

## 2016-07-11 DIAGNOSIS — I5022 Chronic systolic (congestive) heart failure: Secondary | ICD-10-CM | POA: Diagnosis not present

## 2016-07-11 DIAGNOSIS — Z91040 Latex allergy status: Secondary | ICD-10-CM | POA: Insufficient documentation

## 2016-07-11 MED ORDER — CLINDAMYCIN HCL 150 MG PO CAPS: 300.0000 mg | ORAL_CAPSULE | Freq: Three times a day (TID) | ORAL | 0 refills | Status: DC

## 2016-07-11 MED ORDER — NAPROXEN 500 MG PO TABS: 500.0000 mg | ORAL_TABLET | Freq: Two times a day (BID) | ORAL | 0 refills | Status: DC

## 2016-07-11 MED ORDER — NAPROXEN 250 MG PO TABS
500.0000 mg | ORAL_TABLET | Freq: Once | ORAL | Status: AC
  Administered 2016-07-11: 500 mg via ORAL
  Filled 2016-07-11: qty 2

## 2016-07-11 MED ORDER — CLINDAMYCIN HCL 150 MG PO CAPS
300.0000 mg | ORAL_CAPSULE | Freq: Once | ORAL | Status: AC
  Administered 2016-07-11: 300 mg via ORAL
  Filled 2016-07-11: qty 2

## 2016-07-11 NOTE — ED Notes (Signed)
Pt stable, understands discharge instructions, and reasons for return.   

## 2016-07-11 NOTE — ED Provider Notes (Signed)
[facility]EMER[facility]ENCY DEPT Provider Note   CSN: [id number] Arrival date & time: 07/11/16  1900   By signing my name below, I, Freida Busman, attest that this documentation has been prepared under the direction and in the presence of Sharilyn Sites, [name]. Electronically Signed: Freida Busman, Scribe. 07/11/2016. 10:23 PM.  History   Chief Complaint Chief Complaint  Patient presents with  . Dental Pain     The history is provided by the patient. No language interpreter was used.     HPI Comments:  [name] is a 56 y.o. female who presents to the Emergency Department complaining of constant, right lower dental pain x ~ 3 weeks. She states she has a broken wisdom tooth on that site. No alleviating factors noted. Pt also states she has been seen in the ED 3 times for the same pain and was advised to get an orange card which she has received but she was told she cannot get into to be evaluated by a dentist for 3 months so she has returned today hoping to obtain a dental referral.  States she did complete course of amoxicillin but states that did not seem to help.  She denies fever, chills, sweats.  No difficulty swallowing.  No facial or neck swelling.  Past Medical History:  Diagnosis Date  . Anxiety   . Arthritis   . Chronic systolic heart failure (HCC)    EF 20-25% ECHO 05/2015  . COPD (chronic obstructive pulmonary disease) (HCC)   . Depression   . Diabetes mellitus without complication (HCC)    Type II  . Dysrhythmia    pt unsure of name of arrythmia - " my heart rate will drop all of a sudden" -no current treatment - atrial flutter  . Fibrillation, atrial (HCC)   . Gallstones   . GERD (gastroesophageal reflux disease)   . Headache(784.0)    migraines  . Hypertension   . Osteomyelitis (HCC)    R ankle  . Rash    arms    Patient Active Problem List   Diagnosis Date Noted  . De Quervain's disease (radial styloid tenosynovitis) 06/09/2016  . Tobacco abuse 05/23/2016  .  Hyperglycemia without ketosis   . Bleeding hemorrhoids 11/03/2015  . Acute blood loss anemia 11/03/2015  . Hematochezia 11/01/2015  . Back pain with sciatica 09/08/2015  . Infection of bone of ankle (HCC) 07/15/2015  . Chronic systolic CHF (congestive heart failure) (HCC) 06/24/2015  . Chest pain 06/03/2015  . Hypertension   . Anxiety   . Gallstones   . GERD (gastroesophageal reflux disease)   . COPD (chronic obstructive pulmonary disease) (HCC)   . Dysrhythmia   . Rash   . Arthritis   . Depression   . Diabetes mellitus without complication (HCC)   . Post-operative state 12/10/2011    Past Surgical History:  Procedure Laterality Date  . ANKLE FRACTURE SURGERY Right 2015  . CARDIAC CATHETERIZATION N/A 06/24/2015   Procedure: Left Heart Cath and Coronary Angiography;  Surgeon: [name], MD;  Location: [facility] INVASIVE CV LAB;  Service: Cardiovascular;  Laterality: N/A;  . CARPAL TUNNEL RELEASE     bil  . CHOLECYSTECTOMY  11/24/2011   Procedure: LAPAROSCOPIC CHOLECYSTECTOMY WITH INTRAOPERATIVE CHOLANGIOGRAM;  Surgeon: Clovis Pu. Cornett, MD;  Location: [facility] ORS;  Service: General;  Laterality: N/A;  Laparoscopic Cholecystectomy with Cholangiogram  . ECTOPIC PREGNANCY SURGERY  many yrs ago  . HARDWARE REMOVAL Right 07/15/2015   Procedure: RIGHT ANKLE HARDWARE REMOVAL, PLACEMENT OF  STIMULAN ANTIBIOTIC BEADS AND PREVENA WOUND VAC.;  Surgeon: [name], MD;  Location: [facility] OR;  Service: Orthopedics;  Laterality: Right;    OB History    Gravida Para Term Preterm AB Living   9       5 4    SAB TAB Ectopic Multiple Live Births   4   1   4        Home Medications    Prior to Admission medications   Medication Sig Start Date End Date Taking? Authorizing Provider  acetaminophen (TYLENOL) 500 MG tablet Take 1,000 mg by mouth daily as needed (pain).    Historical Provider, MD  acetaminophen-codeine (TYLENOL #3) 300-30 MG tablet Take 1 tablet by mouth every 12 (twelve) hours as  needed for moderate pain. 06/25/16   Jaclyn Shaggy, MD  albuterol (PROVENTIL HFA;VENTOLIN HFA) 108 (90 Base) MCG/ACT inhaler Inhale 2 puffs into the lungs every 6 (six) hours as needed for wheezing or shortness of breath. For wheezing 05/25/16   Nyra Market, MD  ALPRAZolam [name]) 0.5 MG tablet Take 1 mg by mouth 2 (two) times daily. Anxiety    Historical Provider, MD  amoxicillin (AMOXIL) 500 MG capsule Take 1 capsule (500 mg total) by mouth 3 (three) times daily. 06/25/16   Jaclyn Shaggy, MD  aspirin 81 MG EC tablet Take 1 tablet (81 mg total) by mouth daily. 05/25/16   Nyra Market, MD  bisoprolol (ZEBETA) 5 MG tablet Take 0.5 tablets (2.5 mg total) by mouth daily. 05/24/16   Nyra Market, MD  Blood Glucose Monitoring Suppl (TRUE METRIX METER) DEVI 1 each by Does not apply route 3 (three) times daily before meals. 06/25/16   Jaclyn Shaggy, MD  esomeprazole (NEXIUM) 20 MG capsule Take 1 capsule (20 mg total) by mouth daily. 05/24/16   Nyra Market, MD  fenofibrate (TRICOR) 145 MG tablet Take 1 tablet (145 mg total) by mouth daily. 05/26/16   Tiffany Netta Cedars, [name]  FLUoxetine (PROZAC) 40 MG capsule Take 1 capsule (40 mg total) by mouth at bedtime. 05/24/16   Nyra Market, MD  glimepiride (AMARYL) 4 MG tablet Take 1 tablet (4 mg total) by mouth daily with breakfast. 05/24/16   Nyra Market, MD  glucose blood (ACCU-CHEK AVIVA PLUS) test strip 1 strip by Does not apply route 4 (four) times daily. 08/12/15   Historical Provider, MD  glucose blood (TRUE METRIX BLOOD GLUCOSE TEST) test strip Use 3 times daily before meals 06/25/16   Jaclyn Shaggy, MD  hydrOXYzine (ATARAX/VISTARIL) 10 MG tablet Take 2 tablets (20 mg total) by mouth daily as needed for itching. 06/25/16   Jaclyn Shaggy, MD  meclizine (ANTIVERT) 25 MG tablet Take 1 tablet (25 mg total) by mouth 3 (three) times daily as needed for dizziness. 06/25/16   Jaclyn Shaggy, MD  metFORMIN (GLUCOPHAGE) 500 MG tablet Take 1 tablet (500 mg total) by  mouth daily with breakfast. 05/24/16   Nyra Market, MD  pravastatin (PRAVACHOL) 40 MG tablet Take 1 tablet (40 mg total) by mouth daily. 05/24/16   Nyra Market, MD  tiotropium (SPIRIVA) 18 MCG inhalation capsule Place 1 capsule (18 mcg total) into inhaler and inhale daily. 05/24/16   Nyra Market, MD  TRUEPLUS LANCETS 28G MISC 1 each by Does not apply route 3 (three) times daily before meals. 06/25/16   Jaclyn Shaggy, MD  valsartan (DIOVAN) 160 MG tablet Take 1 tablet (160 mg total) by mouth daily with breakfast. 05/24/16   Nyra Market, MD    Family  History Family History  Problem Relation Age of Onset  . Lung cancer Father   . Heart attack Father 72    CABG x4  . Breast cancer Sister     Survivor     Social History Social History  Substance Use Topics  . Smoking status: Current Every Day Smoker    Packs/day: 0.25    Years: 20.00    Types: Cigarettes  . Smokeless tobacco: Never Used     Comment: 6 cigs daily  . Alcohol use No     Allergies   Biaxin [clarithromycin]; Clarithromycin; Lisinopril; Sulfa antibiotics; Chlorthalidone; Daptomycin; Amoxicillin-pot clavulanate; Aspirin; Cholestyramine; Dilaudid [hydromorphone hcl]; Lasix [furosemide]; and Latex   Review of Systems Review of Systems  Constitutional: Negative for chills.  HENT: Positive for dental problem.   All other systems reviewed and are negative.    Physical Exam Updated Vital Signs BP 139/80 (BP Location: Right Arm)   Pulse 62   Temp 98.3 F (36.8 C) (Oral)   Resp 20   LMP 06/09/2011 Comment: verified BEFORE imaging  SpO2 100%   Physical Exam  Constitutional: She is oriented to person, place, and time. She appears well-developed and well-nourished.  HENT:  Head: Normocephalic and atraumatic.  Mouth/Throat: Oropharynx is clear and moist.  Teeth largely in poor dentition,  Multiple teeth missing, right lower wisdom tooth present with large defect and decay noted, surrounding gingiva mildly  swollen with inflamed appearance, handling secretions appropriately, no trismus, no facial or neck swelling, normal phonation without stridor  Eyes: Conjunctivae and EOM are normal. Pupils are equal, round, and reactive to light.  Neck: Normal range of motion.  Cardiovascular: Normal rate, regular rhythm and normal heart sounds.   Pulmonary/Chest: Effort normal and breath sounds normal.  Abdominal: Soft. Bowel sounds are normal.  Musculoskeletal: Normal range of motion.  Neurological: She is alert and oriented to person, place, and time.  Skin: Skin is warm and dry.  Psychiatric: She has a normal mood and affect.  Nursing note and vitals reviewed.    ED Treatments / Results  DIAGNOSTIC STUDIES:  Oxygen Saturation is 100% on RA, normal by my interpretation.    COORDINATION OF CARE:  10:19 PM Discussed treatment plan with pt at bedside and pt agreed to plan.  Labs (all labs ordered are listed, but only abnormal results are displayed) Labs Reviewed - No data to display  EKG  EKG Interpretation None       Radiology No results found.  Procedures Procedures (including critical care time)  Medications Ordered in ED Medications - No data to display   Initial Impression / Assessment and Plan / ED Course  I have reviewed the triage vital signs and the nursing notes.  Pertinent labs & imaging results that were available during my care of the patient were reviewed by me and considered in my medical decision making (see chart for details).  Clinical Course    56 year old female here with right lower dental pain. Seems to be related to broken right lower wisdom tooth. She is afebrile, non-toxic.  She does have some mild surrounding gingival swelling and inflammation without definitive abscess at this time. No signs/symptoms concerning for ludwig's angina at this time.  Patient has completed course of amoxicillin without much improvement. Will switch to clindamycin. She reports  she has to wait 3 months to see a dentist with a Orange card, I provided her with referrals to dentist and given resource guide for local clinics as well.  Discussed  plan with patient, she acknowledged understanding and agreed with plan of care.  Return precautions given for new or worsening symptoms.  Final Clinical Impressions(s) / ED Diagnoses   Final diagnoses:  Pain, dental    New Prescriptions Discharge Medication List as of 07/11/2016 10:43 PM    START taking these medications   Details  clindamycin (CLEOCIN) 150 MG capsule Take 2 capsules (300 mg total) by mouth 3 (three) times daily. May dispense as 150mg  capsules, Starting Sun 07/11/2016, Print    naproxen (NAPROSYN) 500 MG tablet Take 1 tablet (500 mg total) by mouth 2 (two) times daily with a meal., Starting Sun 07/11/2016, Print       I personally performed the services described in this documentation, which was scribed in my presence. The recorded information has been reviewed and is accurate.    Garlon Hatchet, [name] 07/11/16 2315    Alvira Monday, MD 07/14/16 [phone]

## 2016-07-11 NOTE — Discharge Instructions (Signed)
Take the prescribed medication as directed. Follow-up with Dr. Kenney Houseman-- he is our on call oral specialist.  Call his office tomorrow to make an appt. I have attached a list of the other clinics here in the [place] area. Return to the ED for new or worsening symptoms.

## 2016-07-11 NOTE — ED Triage Notes (Signed)
The pt has had a toothache for 3 weeks  She has been  Here several times for the same

## 2016-07-13 ENCOUNTER — Other Ambulatory Visit: Payer: Self-pay | Admitting: Family Medicine

## 2016-07-13 ENCOUNTER — Telehealth: Payer: Self-pay | Admitting: Family Medicine

## 2016-07-13 DIAGNOSIS — K0889 Other specified disorders of teeth and supporting structures: Secondary | ICD-10-CM

## 2016-07-13 MED FILL — CLINDAMYCIN HCL 150 MG CAPS: 150 | 30 days supply | Qty: 60 | Fill #0

## 2016-07-13 MED FILL — NAPROXEN 500 MG TABLET: 500 | 30 days supply | Qty: 30 | Fill #0

## 2016-07-13 NOTE — Telephone Encounter (Signed)
Oral surgeon should provide pain medication after her procedure.

## 2016-07-13 NOTE — Telephone Encounter (Signed)
Patient requesting refill on Tylenol 3 due to needing to have 5 teeth extracted on 07/16/16. Please f/u.

## 2016-07-16 MED FILL — HYDROCODON-APAP 5-325: 5-325 | 3 days supply | Qty: 18 | Fill #0

## 2016-07-16 MED FILL — CHLORHEXIDINE 0.12% RINSE: 0.12 | 15 days supply | Qty: 473 | Fill #0

## 2016-07-16 NOTE — Telephone Encounter (Signed)
Writer called patient advising her to call the oral surgeon and request pain meds per Dr. Venetia Night.  Patient stated understanding.

## 2016-07-20 ENCOUNTER — Telehealth: Payer: Self-pay | Admitting: Family Medicine

## 2016-07-20 NOTE — Telephone Encounter (Signed)
Pt called requesting medication refill on xanax 1mg  pt is taking it 2x a day, pt will be out of medication 1/27, had been getting medication from prior PCP.

## 2016-07-21 NOTE — Telephone Encounter (Signed)
Writer called back and stated per MD that she will not be prescribing xanax for the patient.  Writer explained the taper of the the medication to safely have her come off the medication.  Patient will try the taper but will extend it over a two week period.  She will call if it is not working for her because she has been without this medication before and states she has not done well- having much anxiety and panic attacks.

## 2016-07-21 NOTE — Telephone Encounter (Signed)
She will need to be tapered off Xanax (half tab daily x2 days then half tab daily) as I prescribe other long acting medications or she might have to obtain it from the physician who previously wrote it for her. She is welcome to come in for an office visit.

## 2016-07-26 MED FILL — FLUoxetine HCL 40 MG CAPS: 40 | 30 days supply | Qty: 30 | Fill #2

## 2016-07-26 MED FILL — hydrOXYzine HCL 10 MG TABS: 10 | 30 days supply | Qty: 60 | Fill #1

## 2016-07-26 MED FILL — VALSARTAN 160 MG TABLET: 160 | 30 days supply | Qty: 30 | Fill #2

## 2016-07-26 MED FILL — GLIMEPIRIDE 4 MG TABLET: 4 | 30 days supply | Qty: 30 | Fill #2

## 2016-07-26 MED FILL — ?METFORMIN HCL 500MG TABLET: 500 | 30 days supply | Qty: 30 | Fill #2

## 2016-07-26 MED FILL — PRAVASTATIN NA 40 MG TAB: 40 | 30 days supply | Qty: 30 | Fill #2

## 2016-07-26 MED FILL — TRUE METRIX TEST STRIP: 30 days supply | Qty: 100 | Fill #1

## 2016-07-26 MED FILL — TRAVEL SICKNESS 25 MG TAB C: 25 | 20 days supply | Qty: 60 | Fill #1

## 2016-07-26 MED FILL — BISOPROLOL FUMARATE 5 MG TA: 5 | 30 days supply | Qty: 15 | Fill #2

## 2016-08-11 ENCOUNTER — Emergency Department (HOSPITAL_COMMUNITY)
Admission: EM | Admit: 2016-08-11 | Discharge: 2016-08-12 | Disposition: A | Discharge status: Home / Self Care | Payer: Medicaid Other | Attending: Emergency Medicine | Admitting: Emergency Medicine

## 2016-08-11 DIAGNOSIS — Z7984 Long term (current) use of oral hypoglycemic drugs: Secondary | ICD-10-CM | POA: Insufficient documentation

## 2016-08-11 DIAGNOSIS — F17210 Nicotine dependence, cigarettes, uncomplicated: Secondary | ICD-10-CM | POA: Diagnosis not present

## 2016-08-11 DIAGNOSIS — I5022 Chronic systolic (congestive) heart failure: Secondary | ICD-10-CM | POA: Diagnosis not present

## 2016-08-11 DIAGNOSIS — J449 Chronic obstructive pulmonary disease, unspecified: Secondary | ICD-10-CM | POA: Diagnosis not present

## 2016-08-11 DIAGNOSIS — Z7982 Long term (current) use of aspirin: Secondary | ICD-10-CM | POA: Insufficient documentation

## 2016-08-11 DIAGNOSIS — I110 Hypertensive heart disease with heart failure: Secondary | ICD-10-CM | POA: Diagnosis not present

## 2016-08-11 DIAGNOSIS — Z79899 Other long term (current) drug therapy: Secondary | ICD-10-CM | POA: Diagnosis not present

## 2016-08-11 DIAGNOSIS — R112 Nausea with vomiting, unspecified: Secondary | ICD-10-CM | POA: Diagnosis present

## 2016-08-11 DIAGNOSIS — K529 Noninfective gastroenteritis and colitis, unspecified: Secondary | ICD-10-CM | POA: Diagnosis not present

## 2016-08-11 DIAGNOSIS — Z91040 Latex allergy status: Secondary | ICD-10-CM | POA: Insufficient documentation

## 2016-08-11 LAB — CBC
HCT: 49.2 % — ABNORMAL HIGH (ref 36.0–46.0)
Hemoglobin: 16.9 g/dL — ABNORMAL HIGH (ref 12.0–15.0)
MCH: 29.9 pg (ref 26.0–34.0)
MCHC: 34.3 g/dL (ref 30.0–36.0)
MCV: 87.1 fL (ref 78.0–100.0)
PLATELETS: 187 10*3/uL (ref 150–400)
RBC: 5.65 MIL/uL — AB (ref 3.87–5.11)
RDW: 12.9 % (ref 11.5–15.5)
WBC: 13.8 10*3/uL — AB (ref 4.0–10.5)

## 2016-08-11 LAB — URINALYSIS, MICROSCOPIC (REFLEX): RBC / HPF: NONE SEEN RBC/hpf (ref 0–5)

## 2016-08-11 LAB — COMPREHENSIVE METABOLIC PANEL
ALK PHOS: 106 U/L (ref 38–126)
ALT: 28 U/L (ref 14–54)
AST: 36 U/L (ref 15–41)
Albumin: 4 g/dL (ref 3.5–5.0)
Anion gap: 13 (ref 5–15)
BUN: 11 mg/dL (ref 6–20)
CALCIUM: 9.4 mg/dL (ref 8.9–10.3)
CO2: 22 mmol/L (ref 22–32)
CREATININE: 0.66 mg/dL (ref 0.44–1.00)
Chloride: 104 mmol/L (ref 101–111)
Glucose, Bld: 205 mg/dL — ABNORMAL HIGH (ref 65–99)
Potassium: 4 mmol/L (ref 3.5–5.1)
Sodium: 139 mmol/L (ref 135–145)
Total Bilirubin: 0.7 mg/dL (ref 0.3–1.2)
Total Protein: 7.3 g/dL (ref 6.5–8.1)

## 2016-08-11 LAB — URINALYSIS, ROUTINE W REFLEX MICROSCOPIC
Glucose, UA: 100 mg/dL — AB
HGB URINE DIPSTICK: NEGATIVE
Ketones, ur: NEGATIVE mg/dL
Leukocytes, UA: NEGATIVE
Nitrite: POSITIVE — AB
PROTEIN: 100 mg/dL — AB
Specific Gravity, Urine: >1.03 — ABNORMAL HIGH (ref 1.005–1.030)
pH: 5 (ref 5.0–8.0)

## 2016-08-11 LAB — CBG MONITORING, ED: GLUCOSE-CAPILLARY: 257 mg/dL — AB (ref 65–99)

## 2016-08-11 LAB — POC URINE PREG, ED: PREG TEST UR: NEGATIVE

## 2016-08-11 LAB — LIPASE, BLOOD: Lipase: 34 U/L (ref 11–51)

## 2016-08-11 MED ORDER — SODIUM CHLORIDE 0.9 % IV BOLUS (SEPSIS)
1000.0000 mL | Freq: Once | INTRAVENOUS | Status: AC
  Administered 2016-08-11: 1000 mL via INTRAVENOUS

## 2016-08-11 MED ORDER — ONDANSETRON 4 MG PO TBDP
4.0000 mg | ORAL_TABLET | Freq: Once | ORAL | Status: AC | PRN
Start: 2016-08-11 — End: 2016-08-11
  Administered 2016-08-11: 4 mg via ORAL

## 2016-08-11 MED ORDER — PROMETHAZINE HCL 25 MG/ML IJ SOLN
12.5000 mg | Freq: Once | INTRAMUSCULAR | Status: AC
  Administered 2016-08-11: 12.5 mg via INTRAVENOUS
  Filled 2016-08-11: qty 1

## 2016-08-11 MED ORDER — ONDANSETRON 4 MG PO TBDP
ORAL_TABLET | ORAL | Status: AC
  Filled 2016-08-11: qty 1

## 2016-08-11 MED ORDER — ONDANSETRON 8 MG PO TBDP: 8.0000 mg | ORAL_TABLET | Freq: Three times a day (TID) | ORAL | 0 refills | Status: DC | PRN

## 2016-08-11 NOTE — ED Provider Notes (Signed)
[facility]PT Provider Note   CSN: [id number] Arrival date & time: 08/11/16  1947     History   Chief Complaint Chief Complaint  Patient presents with  . Abdominal Pain  . Emesis    HPI [name] is a 56 y.o. female.  HPI 56 year old female presents today with nausea vomiting and diarrhea. Her daughter has had similar symptoms. She's had some subjective fever. She denies any blood or bile in her emesis or blood in her stool. She is having some diffuse body aches and abdominal cramping. Past Medical History:  Diagnosis Date  . Anxiety   . Arthritis   . Chronic systolic heart failure (HCC)    EF 20-25% ECHO 05/2015  . COPD (chronic obstructive pulmonary disease) (HCC)   . Depression   . Diabetes mellitus without complication (HCC)    Type II  . Dysrhythmia    pt unsure of name of arrythmia - " my heart rate will drop all of a sudden" -no current treatment - atrial flutter  . Fibrillation, atrial (HCC)   . Gallstones   . GERD (gastroesophageal reflux disease)   . Headache(784.0)    migraines  . Hypertension   . Osteomyelitis (HCC)    R ankle  . Rash    arms    Patient Active Problem List   Diagnosis Date Noted  . De Quervain's disease (radial styloid tenosynovitis) 06/09/2016  . Tobacco abuse 05/23/2016  . Hyperglycemia without ketosis   . Bleeding hemorrhoids 11/03/2015  . Acute blood loss anemia 11/03/2015  . Hematochezia 11/01/2015  . Back pain with sciatica 09/08/2015  . Infection of bone of ankle (HCC) 07/15/2015  . Chronic systolic CHF (congestive heart failure) (HCC) 06/24/2015  . Chest pain 06/03/2015  . Hypertension   . Anxiety   . Gallstones   . GERD (gastroesophageal reflux disease)   . COPD (chronic obstructive pulmonary disease) (HCC)   . Dysrhythmia   . Rash   . Arthritis   . Depression   . Diabetes mellitus without complication (HCC)   . Post-operative state 12/10/2011    Past Surgical History:  Procedure Laterality Date    . ANKLE FRACTURE SURGERY Right 2015  . CARDIAC CATHETERIZATION N/A 06/24/2015   Procedure: Left Heart Cath and Coronary Angiography;  Surgeon: [name], MD;  Location: [facility] INVASIVE CV LAB;  Service: Cardiovascular;  Laterality: N/A;  . CARPAL TUNNEL RELEASE     bil  . CHOLECYSTECTOMY  11/24/2011   Procedure: LAPAROSCOPIC CHOLECYSTECTOMY WITH INTRAOPERATIVE CHOLANGIOGRAM;  Surgeon: Clovis Pu. Cornett, MD;  Location: [facility] ORS;  Service: General;  Laterality: N/A;  Laparoscopic Cholecystectomy with Cholangiogram  . ECTOPIC PREGNANCY SURGERY  many yrs ago  . HARDWARE REMOVAL Right 07/15/2015   Procedure: RIGHT ANKLE HARDWARE REMOVAL, PLACEMENT OF STIMULAN ANTIBIOTIC BEADS AND PREVENA WOUND VAC.;  Surgeon: [name], MD;  Location: [facility] OR;  Service: Orthopedics;  Laterality: Right;    OB History    Gravida Para Term Preterm AB Living   9       5 4    SAB TAB Ectopic Multiple Live Births   4   1   4        Home Medications    Prior to Admission medications   Medication Sig Start Date End Date Taking? Authorizing Provider  acetaminophen (TYLENOL) 500 MG tablet Take 1,000 mg by mouth daily as needed (pain).   Yes Historical Provider, MD  albuterol (PROVENTIL HFA;VENTOLIN HFA) 108 (90 Base) MCG/ACT inhaler Inhale  2 puffs into the lungs every 6 (six) hours as needed for wheezing or shortness of breath. For wheezing 05/25/16  Yes Nyra Market, MD  aspirin 81 MG EC tablet Take 1 tablet (81 mg total) by mouth daily. 05/25/16  Yes Nyra Market, MD  bisoprolol (ZEBETA) 5 MG tablet Take 0.5 tablets (2.5 mg total) by mouth daily. 05/24/16  Yes Nyra Market, MD  esomeprazole (NEXIUM) 20 MG capsule Take 1 capsule (20 mg total) by mouth daily. 05/24/16  Yes Nyra Market, MD  FLUoxetine (PROZAC) 40 MG capsule Take 1 capsule (40 mg total) by mouth at bedtime. 05/24/16  Yes Nyra Market, MD  glimepiride (AMARYL) 4 MG tablet Take 1 tablet (4 mg total) by mouth daily with breakfast. 05/24/16   Yes Nyra Market, MD  hydrOXYzine (ATARAX/VISTARIL) 10 MG tablet Take 2 tablets (20 mg total) by mouth daily as needed for itching. Patient taking differently: Take 10 mg by mouth daily.  06/25/16  Yes Jaclyn Shaggy, MD  meclizine (ANTIVERT) 25 MG tablet Take 1 tablet (25 mg total) by mouth 3 (three) times daily as needed for dizziness. Patient taking differently: Take 25 mg by mouth 3 (three) times daily.  06/25/16  Yes Jaclyn Shaggy, MD  metFORMIN (GLUCOPHAGE) 500 MG tablet Take 1 tablet (500 mg total) by mouth daily with breakfast. 05/24/16  Yes Nyra Market, MD  pravastatin (PRAVACHOL) 40 MG tablet Take 1 tablet (40 mg total) by mouth daily. 05/24/16  Yes Nyra Market, MD  tiotropium (SPIRIVA) 18 MCG inhalation capsule Place 1 capsule (18 mcg total) into inhaler and inhale daily. 05/24/16  Yes Nyra Market, MD  valsartan (DIOVAN) 160 MG tablet Take 1 tablet (160 mg total) by mouth daily with breakfast. 05/24/16  Yes Nyra Market, MD  ALPRAZolam (XANAX) 0.5 MG tablet Take 1 mg by mouth 2 (two) times daily. Anxiety    Historical Provider, MD    Family History Family History  Problem Relation Age of Onset  . Lung cancer Father   . Heart attack Father 73    CABG x4  . Breast cancer Sister     Survivor     Social History Social History  Substance Use Topics  . Smoking status: Current Every Day Smoker    Packs/day: 0.25    Years: 20.00    Types: Cigarettes  . Smokeless tobacco: Never Used     Comment: 6 cigs daily  . Alcohol use No     Allergies   Biaxin [clarithromycin]; Clarithromycin; Lisinopril; Sulfa antibiotics; Chlorthalidone; Daptomycin; Amoxicillin-pot clavulanate; Aspirin; Cholestyramine; Dilaudid [hydromorphone hcl]; Lasix [furosemide]; and Latex   Review of Systems Review of Systems  All other systems reviewed and are negative.    Physical Exam Updated Vital Signs BP 102/66   Pulse 86   Temp 98.4 F (36.9 C) (Oral)   Resp 20   LMP 07/24/2011    SpO2 98%   Physical Exam  Constitutional: She is oriented to person, place, and time. She appears well-developed and well-nourished. No distress.  HENT:  Head: Normocephalic and atraumatic.  Right Ear: External ear normal.  Left Ear: External ear normal.  Nose: Nose normal.  Eyes: Conjunctivae and EOM are normal. Pupils are equal, round, and reactive to light.  Neck: Normal range of motion. Neck supple.  Pulmonary/Chest: Effort normal.  Abdominal: Soft. Bowel sounds are normal. She exhibits no distension. There is no tenderness.  Musculoskeletal: Normal range of motion.  Neurological: She is alert and oriented to person, place, and time. She exhibits  normal muscle tone. Coordination normal.  Skin: Skin is warm and dry. Capillary refill takes less than 2 seconds.  Psychiatric: She has a normal mood and affect. Her behavior is normal. Thought content normal.  Nursing note and vitals reviewed.    ED Treatments / Results  Labs (all labs ordered are listed, but only abnormal results are displayed) Labs Reviewed  COMPREHENSIVE METABOLIC PANEL - Abnormal; Notable for the following:       Result Value   Glucose, Bld 205 (*)    All other components within normal limits  CBC - Abnormal; Notable for the following:    WBC 13.8 (*)    RBC 5.65 (*)    Hemoglobin 16.9 (*)    HCT 49.2 (*)    All other components within normal limits  URINALYSIS, ROUTINE W REFLEX MICROSCOPIC - Abnormal; Notable for the following:    Specific Gravity, Urine >1.030 (*)    Glucose, UA 100 (*)    Bilirubin Urine MODERATE (*)    Protein, ur 100 (*)    Nitrite POSITIVE (*)    All other components within normal limits  URINALYSIS, MICROSCOPIC (REFLEX) - Abnormal; Notable for the following:    Bacteria, UA RARE (*)    Squamous Epithelial / LPF 0-5 (*)    All other components within normal limits  CBG MONITORING, ED - Abnormal; Notable for the following:    Glucose-Capillary 257 (*)    All other components  within normal limits  LIPASE, BLOOD  POC URINE PREG, ED    EKG  EKG Interpretation None       Radiology No results found.  Procedures Procedures (including critical care time)  Medications Ordered in ED Medications  ondansetron (ZOFRAN-ODT) disintegrating tablet 4 mg (4 mg Oral Given 08/11/16 2011)  sodium chloride 0.9 % bolus 1,000 mL (1,000 mLs Intravenous New Bag/Given 08/11/16 2149)  promethazine (PHENERGAN) injection 12.5 mg (12.5 mg Intravenous Given 08/11/16 2151)     Initial Impression / Assessment and Plan / ED Course  I have reviewed the triage vital signs and the nursing notes.  Pertinent labs & imaging results that were available during my care of the patient were reviewed by me and considered in my medical decision making (see chart for details).     Patient received IV fluids, Zofran, and Phenergan. She has been tolerating by mouth fluids here.  Final Clinical Impressions(s) / ED Diagnoses   Final diagnoses:  Gastroenteritis    New Prescriptions New Prescriptions   ONDANSETRON (ZOFRAN ODT) 8 MG DISINTEGRATING TABLET    Take 1 tablet (8 mg total) by mouth every 8 (eight) hours as needed for nausea or vomiting.     Margarita Grizzle, MD 08/11/16 2308

## 2016-08-11 NOTE — ED Triage Notes (Signed)
Pt complaining of L sided abdominal pain. Pt states N/V/D x 3 days. Pt states 12 episodes of emesis and diarrhea today. Pt denies any fevers or urinary symptoms.

## 2016-08-12 MED FILL — ONDANSETRON ODT 8 MG TABLET: 8 | 7 days supply | Qty: 20 | Fill #0

## 2016-09-10 ENCOUNTER — Encounter: Payer: Self-pay | Admitting: Cardiovascular Disease

## 2016-09-10 ENCOUNTER — Encounter (INDEPENDENT_AMBULATORY_CARE_PROVIDER_SITE_OTHER): Payer: Self-pay

## 2016-09-10 ENCOUNTER — Ambulatory Visit (INDEPENDENT_AMBULATORY_CARE_PROVIDER_SITE_OTHER): Payer: Medicaid Other | Admitting: Cardiovascular Disease

## 2016-09-10 VITALS — BP 150/80 | HR 66 | Ht 67.0 in | Wt 162.8 lb

## 2016-09-10 DIAGNOSIS — I5022 Chronic systolic (congestive) heart failure: Secondary | ICD-10-CM | POA: Diagnosis not present

## 2016-09-10 DIAGNOSIS — I10 Essential (primary) hypertension: Secondary | ICD-10-CM

## 2016-09-10 NOTE — Patient Instructions (Signed)
Medication Instructions:  Your physician recommends that you continue on your current medications as directed. Please refer to the Current Medication list given to you today.   Labwork: None Ordered   Testing/Procedures: Your physician has requested that you have an echocardiogram. Echocardiography is a painless test that uses sound waves to create images of your heart. It provides your doctor with information about the size and shape of your heart and how well your heart's chambers and valves are working. This procedure takes approximately one hour. There are no restrictions for this procedure.    Follow-Up: Your physician recommends that you schedule a follow-up appointment in: 3 months with Dr. Nahser.    If you need a refill on your cardiac medications before your next appointment, please call your pharmacy.   Thank you for choosing [facility]! Michelle Swinyer, RN [phone]    

## 2016-09-10 NOTE — Progress Notes (Signed)
Cardiology Office Note   Date:  09/10/2016   ID:  [name], DOB 01/30/1961, MRN [id number]  PCP:  Jaclyn Shaggy, MD  Cardiologist:   Kristeen Miss, MD   Chief Complaint  Patient presents with  . Follow-up    CHF    problem list 1. Chronic systolic congestive heart failure 2. Osteomyelitis of the right foot 3. Essential hypertension 4. COPD 5. Diabetes Mellitus    History of Present Illness: Dec. 6, 2016:  [name] a 56 y.o. female who presents for left arm tingling, heaviness Associated with left chest pressure - pushing sensation  Lasts from 2-4 minutes.   Occurs spontansously .  - at rest and exertion . Walks on occasion - not necessarily associated with walking  Have been going in since Oct.   Went to the ER on Oct. 12.   Saw [facility] in 2010, Wore a monitor - showed some fluttering - she does not recall what it reported.  Had a cath in 2010 for similar issues.    Results and images are not found in epic.    Smokes - 5 cigarettes a day . No ETOH Father had CABG at age 84  Jul 25, 2015:  [name] [name] seen back  For follow-up of her congestive heart failure. She had a cardiac catheterization which revealed no significant CAD.  her ejection fraction [name] between 20 and 30%. She's had surgery on her right foot to remove hardware that was placed during a previous surgery.  She has osteomyelitis of her right foot. She has a PICC line and gets antibiotics twice  a day.  No CP  Breathing [name] ok. Still has palpitations .    September 10, 2016: [name] seen back after a year. She has a history of chronic systolic congestive heart failure. She has normal coronary arteries by heart cavitation in December, 2016.  I saw her in January 2017 and set her up for a six-week return visit. She now returns after 14 months. She was hospitalized in Nov with CHF .  [name] back on her meds  Still gets 2nd hand smoke.   Breathing has improved.    Past Medical History:    Diagnosis Date  . Anxiety   . Arthritis   . Chronic systolic heart failure (HCC)    EF 20-25% ECHO 05/2015  . COPD (chronic obstructive pulmonary disease) (HCC)   . Depression   . Diabetes mellitus without complication (HCC)    Type II  . Dysrhythmia    pt unsure of name of arrythmia - " my heart rate will drop all of a sudden" -no current treatment - atrial flutter  . Fibrillation, atrial (HCC)   . Gallstones   . GERD (gastroesophageal reflux disease)   . Headache(784.0)    migraines  . Hypertension   . Osteomyelitis (HCC)    R ankle  . Rash    arms    Past Surgical History:  Procedure Laterality Date  . ANKLE FRACTURE SURGERY Right 2015  . CARDIAC CATHETERIZATION N/A 06/24/2015   Procedure: Left Heart Cath and Coronary Angiography;  Surgeon: [name], MD;  [facility] INVASIVE CV LAB;  Service: Cardiovascular;  Laterality: N/A;  . CARPAL TUNNEL RELEASE     bil  . CHOLECYSTECTOMY  11/24/2011   Procedure: LAPAROSCOPIC CHOLECYSTECTOMY WITH INTRAOPERATIVE CHOLANGIOGRAM;  Surgeon: Clovis Pu. Cornett, MD;  Location: [facility] ORS;  Service: General;  Laterality: N/A;  Laparoscopic Cholecystectomy with Cholangiogram  .  ECTOPIC PREGNANCY SURGERY  many yrs ago  . HARDWARE REMOVAL Right 07/15/2015   Procedure: RIGHT ANKLE HARDWARE REMOVAL, PLACEMENT OF STIMULAN ANTIBIOTIC BEADS AND PREVENA WOUND VAC.;  Surgeon: [name], MD;  Location: [facility] OR;  Service: Orthopedics;  Laterality: Right;     Current Outpatient Prescriptions  Medication Sig Dispense Refill  . acetaminophen (TYLENOL) 500 MG tablet Take 1,000 mg by mouth daily as needed (pain).    Marland Kitchen albuterol (PROVENTIL HFA;VENTOLIN HFA) 108 (90 Base) MCG/ACT inhaler Inhale 2 puffs into the lungs every 6 (six) hours as needed for wheezing or shortness of breath. For wheezing 1 Inhaler 11  . aspirin 81 MG EC tablet Take 1 tablet (81 mg total) by mouth daily. 30 tablet 2  . bisoprolol (ZEBETA) 5 MG tablet Take 0.5 tablets (2.5 mg  total) by mouth daily. 30 tablet 5  . esomeprazole (NEXIUM) 20 MG capsule Take 1 capsule (20 mg total) by mouth daily. 30 capsule 2  . FLUoxetine (PROZAC) 40 MG capsule Take 1 capsule (40 mg total) by mouth at bedtime. 90 capsule 3  . glimepiride (AMARYL) 4 MG tablet Take 1 tablet (4 mg total) by mouth daily with breakfast. 30 tablet 2  . hydrOXYzine (ATARAX/VISTARIL) 10 MG tablet Take 2 tablets (20 mg total) by mouth daily as needed for itching. (Patient taking differently: Take 10 mg by mouth daily. ) 60 tablet 3  . meclizine (ANTIVERT) 25 MG tablet Take 1 tablet (25 mg total) by mouth 3 (three) times daily as needed for dizziness. (Patient taking differently: Take 25 mg by mouth 3 (three) times daily. ) 60 tablet 3  . metFORMIN (GLUCOPHAGE) 500 MG tablet Take 1 tablet (500 mg total) by mouth daily with breakfast. 90 tablet 3  . ondansetron (ZOFRAN ODT) 8 MG disintegrating tablet Take 1 tablet (8 mg total) by mouth every 8 (eight) hours as needed for nausea or vomiting. 20 tablet 0  . pravastatin (PRAVACHOL) 40 MG tablet Take 1 tablet (40 mg total) by mouth daily. 30 tablet 2  . tiotropium (SPIRIVA) 18 MCG inhalation capsule Place 1 capsule (18 mcg total) into inhaler and inhale daily. 30 capsule 12  . valsartan (DIOVAN) 160 MG tablet Take 1 tablet (160 mg total) by mouth daily with breakfast. 90 tablet 3   No current facility-administered medications for this visit.     Allergies:   Biaxin [clarithromycin]; Clarithromycin; Lisinopril; Sulfa antibiotics; Chlorthalidone; Daptomycin; Amoxicillin-pot clavulanate; Aspirin; Cholestyramine; Dilaudid [hydromorphone hcl]; Lasix [furosemide]; and Latex    Social History:  The patient  reports that she has been smoking Cigarettes.  She has a 5.00 pack-year smoking history. She has never used smokeless tobacco. She reports that she does not drink alcohol or use drugs.   Family History:  The patient's family history includes Breast cancer in her sister;  Heart attack (age of onset: 11) in her father; Lung cancer in her father.    ROS:  Please see the history of present illness.    Review of Systems: Constitutional:  denies fever, chills, diaphoresis, appetite change and fatigue.  HEENT: denies photophobia, eye pain, redness, hearing loss, ear pain, congestion, sore throat, rhinorrhea, sneezing, neck pain, neck stiffness and tinnitus.  Respiratory: admits to SOB, DOE, cough, chest tightness, and wheezing.  Cardiovascular: admits to chest pain, palpitations , denies  leg swelling.  Gastrointestinal: denies nausea, vomiting, abdominal pain, diarrhea, constipation, blood in stool.  Genitourinary: denies dysuria, urgency, frequency, hematuria, flank pain and difficulty urinating.  Musculoskeletal: denies  myalgias, back pain, joint swelling, arthralgias and gait problem.   Skin: denies pallor, rash and wound.  Neurological: denies dizziness, seizures, syncope, weakness, light-headedness, numbness and headaches.   Hematological: denies adenopathy, easy bruising, personal or family bleeding history.  Psychiatric/ Behavioral: denies suicidal ideation, mood changes, confusion, nervousness, sleep disturbance and agitation.       All other systems are reviewed and negative.    PHYSICAL EXAM: VS:  BP (!) 150/80 (BP Location: Right Arm, Patient Position: Sitting, Cuff Size: Normal)   Pulse 66   Ht 5\' 7"  (1.702 m)   Wt 162 lb 12.8 oz (73.8 kg)   LMP 07/24/2011   SpO2 95%   BMI 25.50 kg/m  , BMI Body mass index [name] 25.5 kg/m. GEN: Well nourished, well developed, in no acute distress  HEENT: normal  Neck: no JVD, carotid bruits, or masses Cardiac: RRR; no murmurs, rubs, or gallops,no edema  Respiratory:  Coarse wheezing - especially in the right base .    GI: soft, nontender, nondistended, + BS MS: no deformity or atrophy  Skin: warm and dry, no rash Neuro:  Strength and sensation are intact Psych: normal   EKG:  EKG [name] ordered  today. The ekg ordered today demonstrates NSR at 90.  LBBB  - the LBBB [name] new since her ECG in 2013.    Recent Labs: 05/24/2016: B Natriuretic Peptide 49.1 08/11/2016: ALT 28; BUN 11; Creatinine, Ser 0.66; Hemoglobin 16.9; Platelets 187; Potassium 4.0; Sodium 139    Lipid Panel    Component Value Date/Time   CHOL 144 07/05/2016 0947   TRIG 120 07/05/2016 0947   HDL 28 (L) 07/05/2016 0947   CHOLHDL 5.1 (H) 07/05/2016 0947   VLDL UNABLE TO CALCULATE IF TRIGLYCERIDE OVER 400 mg/dL 16/03/9603 5409   LDLCALC UNABLE TO CALCULATE IF TRIGLYCERIDE OVER 400 mg/dL 81/19/1478 2956      Wt Readings from Last 3 Encounters:  09/10/16 162 lb 12.8 oz (73.8 kg)  06/25/16 159 lb (72.1 kg)  06/18/16 153 lb (69.4 kg)      Other studies Reviewed: Additional studies/ records that were reviewed today include: . Review of the above records demonstrates:    ASSESSMENT AND PLAN:  1.  Chronic systolic CHF [name]  Has an EF of 20-30% [name] on valsartan 160 mg a day .  Still has lots of wheezing  But this seemed to clear after she did a deep cough.  Will repeat the echo to eval her LV function Office visit in 3 months   2. HTN:  BP [name] still elevated  She [name] still eating canned foods.  Will improve her diet and call if her BP does not improve Will check again in 3 months      Current medicines are reviewed at length with the patient today.  The patient does not have concerns regarding medicines.  The following changes have been made:  no change  Labs/ tests ordered today include:  No orders of the defined types were placed in this encounter.    Disposition:   FU with  Me in 3 months    Kristeen Miss, MD  09/10/2016 11:44 AM    [facility] [address] [place], [place], [place]  [address] Phone: [phone]; Fax: [phone]

## 2016-09-14 ENCOUNTER — Other Ambulatory Visit: Payer: Self-pay | Admitting: Internal Medicine

## 2016-09-14 MED FILL — BISOPROLOL FUMARATE 5 MG TA: 5 | 30 days supply | Qty: 15 | Fill #3

## 2016-09-14 MED FILL — FLUoxetine HCL 40 MG CAPS: 40 | 30 days supply | Qty: 30 | Fill #3

## 2016-09-14 MED FILL — metFORMIN HCL 500 MG TABS: 500 | 30 days supply | Qty: 30 | Fill #3

## 2016-09-14 MED FILL — VALSARTAN 160 MG TABLET: 160 | 30 days supply | Qty: 30 | Fill #3

## 2016-09-15 ENCOUNTER — Other Ambulatory Visit: Payer: Self-pay | Admitting: Internal Medicine

## 2016-09-28 ENCOUNTER — Other Ambulatory Visit: Payer: Self-pay | Admitting: Internal Medicine

## 2016-09-28 ENCOUNTER — Other Ambulatory Visit (HOSPITAL_COMMUNITY): Payer: Medicaid Other

## 2016-09-29 ENCOUNTER — Other Ambulatory Visit: Payer: Self-pay

## 2016-09-29 ENCOUNTER — Ambulatory Visit (HOSPITAL_COMMUNITY): Payer: Medicaid Other | Attending: Internal Medicine

## 2016-09-29 DIAGNOSIS — I509 Heart failure, unspecified: Secondary | ICD-10-CM | POA: Diagnosis present

## 2016-09-29 DIAGNOSIS — E119 Type 2 diabetes mellitus without complications: Secondary | ICD-10-CM | POA: Diagnosis not present

## 2016-09-29 DIAGNOSIS — I110 Hypertensive heart disease with heart failure: Secondary | ICD-10-CM | POA: Insufficient documentation

## 2016-09-29 DIAGNOSIS — Z720 Tobacco use: Secondary | ICD-10-CM | POA: Diagnosis not present

## 2016-09-29 DIAGNOSIS — I5022 Chronic systolic (congestive) heart failure: Secondary | ICD-10-CM

## 2016-09-29 DIAGNOSIS — I358 Other nonrheumatic aortic valve disorders: Secondary | ICD-10-CM | POA: Diagnosis not present

## 2016-09-30 ENCOUNTER — Encounter (INDEPENDENT_AMBULATORY_CARE_PROVIDER_SITE_OTHER): Payer: Self-pay | Admitting: Orthopedic Surgery

## 2016-09-30 ENCOUNTER — Ambulatory Visit (INDEPENDENT_AMBULATORY_CARE_PROVIDER_SITE_OTHER): Payer: Medicaid Other | Admitting: Orthopedic Surgery

## 2016-09-30 ENCOUNTER — Ambulatory Visit (INDEPENDENT_AMBULATORY_CARE_PROVIDER_SITE_OTHER): Payer: Medicaid Other

## 2016-09-30 DIAGNOSIS — M5441 Lumbago with sciatica, right side: Secondary | ICD-10-CM | POA: Diagnosis not present

## 2016-09-30 DIAGNOSIS — M5442 Lumbago with sciatica, left side: Secondary | ICD-10-CM

## 2016-09-30 DIAGNOSIS — M79641 Pain in right hand: Secondary | ICD-10-CM

## 2016-09-30 DIAGNOSIS — G8929 Other chronic pain: Secondary | ICD-10-CM | POA: Diagnosis not present

## 2016-09-30 DIAGNOSIS — M79642 Pain in left hand: Secondary | ICD-10-CM | POA: Diagnosis not present

## 2016-09-30 MED ORDER — OXYCODONE-ACETAMINOPHEN 5-325 MG PO TABS: 1.0000 | ORAL_TABLET | Freq: Every day | ORAL | 0 refills | Status: DC | PRN

## 2016-09-30 NOTE — Progress Notes (Signed)
Office Visit Note   Patient: [name]           Date of Birth: 1961-02-28           MRN: [id number] Visit Date: 09/30/2016 Requested by: Jaclyn Shaggy, MD [address] [place], [place] [address] PCP: Jaclyn Shaggy, MD  Subjective: Chief Complaint  Patient presents with  . Lower Back - Pain    HPI: [name] is a 56 year old patient with bilateral leg pain and buttock pain left worse than right.  Reports shooting pain down the leg.  Describes numbness and tingling on the left-hand side along with occasional groin pain.  She also reports low back pain and some catching.  Since gone on for 6-8 months.  It's worse with prolonged sitting or standing.  The pain is constant and it comes and goes in severity.  She is getting coria transplantation in 2 weeks.  She also describes bilateral hand symptoms.  She states see a rheumatologist because she can't really close her hands some arthritis in her finger joints.  She states she hasn't had any pain medicine since October or November.  She states her right ankle is doing well.  She's been discontinued from the infectious disease clinic.              ROS: All systems reviewed are negative as they relate to the chief complaint within the history of present illness.  Patient denies  fevers or chills.   Assessment & Plan: Visit Diagnoses:  1. Chronic midline low back pain with bilateral sciatica   2. Pain in both hands     Plan: Impression is likely spinal stenosis of the lumbar spine versus bulging central disc. On 6-8 months.  Interferes with her activities of daily living as well as her sleep.  She needs MRI L-spine to evaluate for stenosis.  She does report diminished walking endurance and reproducible pain with activity.  She has mildly positive nerve retention signs on the left less so on the right.  Some paresthesias on the left-hand side as well.  In regards to the hands she's had bilateral carpal tunnel release.  She has bilateral hand  arthritis which is reasonable to be evaluated by a rheumatologist.  Not really having much in the way of numbness and tingling in the hands but does report decreased strength.  In regards to the ankle that looks reasonable at this time.  No real erythema fluctuance incision is healed no drainage at this time  Follow-Up Instructions: No Follow-up on file.   Orders:  Orders Placed This Encounter  Procedures  . XR Lumbar Spine 2-3 Views   No orders of the defined types were placed in this encounter.     Procedures: No procedures performed   Clinical Data: No additional findings.  Objective: Vital Signs: LMP 07/24/2011   Physical Exam:   Constitutional: Patient appears well-developed HEENT:  Head: Normocephalic Eyes:EOM are normal Neck: Normal range of motion Cardiovascular: Normal rate Pulmonary/chest: Effort normal Neurologic: Patient is alert Skin: Skin is warm Psychiatric: Patient has normal mood and affect    Ortho Exam: Orthopedic exam demonstrates normal gait and alignment some sciatic notch tenderness on the left more than the right no sacroiliac joint tenderness.  Some pain with forward lateral bending.  No trochanteric tenderness is noted.  Reflexes symmetric 1+ for bilateral patella and Achilles.  Negative Babinski negative clonus patient has palpable pedal pulses.  Well-healed surgical incision on the lateral aspect of her right  ankle.  No erythema induration or drainage.  In regards to her groin she has no groin pain with internal/external rotation of either leg.  Has 5 out of 5 ankle dorsi flexion plantar flexion quite hamstring strength.  Both hands are examined.  Not much in the way of MCP synovitis but she does have a lot of arthritis in the PIP and DIP joints.  This does restrict her hand motion.  She has well-healed surgical incisions over both carpal tunnels.  Specialty Comments:  No specialty comments available.  Imaging: Xr Lumbar Spine 2-3  Views  Result Date: 09/30/2016 2 views lumbar spine reviewed.  AP and lateral.  No spondylolisthesis or compression fractures.  Mild facet arthritis is present.  Minimal degenerative disc disease between the lumbar vertebral bodies.  Sacroiliac joints appear normal    PMFS History: Patient Active Problem List   Diagnosis Date Noted  . De Quervain's disease (radial styloid tenosynovitis) 06/09/2016  . Tobacco abuse 05/23/2016  . Hyperglycemia without ketosis   . Bleeding hemorrhoids 11/03/2015  . Acute blood loss anemia 11/03/2015  . Hematochezia 11/01/2015  . Back pain with sciatica 09/08/2015  . Infection of bone of ankle (HCC) 07/15/2015  . Chronic systolic CHF (congestive heart failure) (HCC) 06/24/2015  . Chest pain 06/03/2015  . Hypertension   . Anxiety   . Gallstones   . GERD (gastroesophageal reflux disease)   . COPD (chronic obstructive pulmonary disease) (HCC)   . Dysrhythmia   . Rash   . Arthritis   . Depression   . Diabetes mellitus without complication (HCC)   . Post-operative state 12/10/2011   Past Medical History:  Diagnosis Date  . Anxiety   . Arthritis   . Chronic systolic heart failure (HCC)    EF 20-25% ECHO 05/2015  . COPD (chronic obstructive pulmonary disease) (HCC)   . Depression   . Diabetes mellitus without complication (HCC)    Type II  . Dysrhythmia    pt unsure of name of arrythmia - " my heart rate will drop all of a sudden" -no current treatment - atrial flutter  . Fibrillation, atrial (HCC)   . Gallstones   . GERD (gastroesophageal reflux disease)   . Headache(784.0)    migraines  . Hypertension   . Osteomyelitis (HCC)    R ankle  . Rash    arms    Family History  Problem Relation Age of Onset  . Lung cancer Father   . Heart attack Father 97    CABG x4  . Breast cancer Sister     Survivor     Past Surgical History:  Procedure Laterality Date  . ANKLE FRACTURE SURGERY Right 2015  . CARDIAC CATHETERIZATION N/A 06/24/2015    Procedure: Left Heart Cath and Coronary Angiography;  Surgeon: [name], MD;  Location: [facility] INVASIVE CV LAB;  Service: Cardiovascular;  Laterality: N/A;  . CARPAL TUNNEL RELEASE     bil  . CHOLECYSTECTOMY  11/24/2011   Procedure: LAPAROSCOPIC CHOLECYSTECTOMY WITH INTRAOPERATIVE CHOLANGIOGRAM;  Surgeon: Clovis Pu. Cornett, MD;  Location: [facility] ORS;  Service: General;  Laterality: N/A;  Laparoscopic Cholecystectomy with Cholangiogram  . ECTOPIC PREGNANCY SURGERY  many yrs ago  . HARDWARE REMOVAL Right 07/15/2015   Procedure: RIGHT ANKLE HARDWARE REMOVAL, PLACEMENT OF STIMULAN ANTIBIOTIC BEADS AND PREVENA WOUND VAC.;  Surgeon: [name], MD;  Location: [facility] OR;  Service: Orthopedics;  Laterality: Right;   Social History   Occupational History  . Not on file.  Social History Main Topics  . Smoking status: Current Every Day Smoker    Packs/day: 0.25    Years: 20.00    Types: Cigarettes  . Smokeless tobacco: Never Used     Comment: 6 cigs daily  . Alcohol use No  . Drug use: No  . Sexual activity: Not Currently    Birth control/ protection: Post-menopausal

## 2016-10-06 ENCOUNTER — Telehealth: Payer: Self-pay | Admitting: Radiology

## 2016-10-06 DIAGNOSIS — R768 Other specified abnormal immunological findings in serum: Secondary | ICD-10-CM | POA: Insufficient documentation

## 2016-10-06 DIAGNOSIS — M19071 Primary osteoarthritis, right ankle and foot: Secondary | ICD-10-CM | POA: Insufficient documentation

## 2016-10-06 DIAGNOSIS — Z8719 Personal history of other diseases of the digestive system: Secondary | ICD-10-CM | POA: Insufficient documentation

## 2016-10-06 DIAGNOSIS — Z8659 Personal history of other mental and behavioral disorders: Secondary | ICD-10-CM | POA: Insufficient documentation

## 2016-10-06 DIAGNOSIS — M19041 Primary osteoarthritis, right hand: Secondary | ICD-10-CM | POA: Insufficient documentation

## 2016-10-06 DIAGNOSIS — M170 Bilateral primary osteoarthritis of knee: Secondary | ICD-10-CM | POA: Insufficient documentation

## 2016-10-06 DIAGNOSIS — M19072 Primary osteoarthritis, left ankle and foot: Secondary | ICD-10-CM

## 2016-10-06 DIAGNOSIS — M19042 Primary osteoarthritis, left hand: Principal | ICD-10-CM

## 2016-10-06 DIAGNOSIS — Z8619 Personal history of other infectious and parasitic diseases: Secondary | ICD-10-CM | POA: Insufficient documentation

## 2016-10-06 DIAGNOSIS — Z8679 Personal history of other diseases of the circulatory system: Secondary | ICD-10-CM | POA: Insufficient documentation

## 2016-10-06 DIAGNOSIS — Z8709 Personal history of other diseases of the respiratory system: Secondary | ICD-10-CM | POA: Insufficient documentation

## 2016-10-06 NOTE — Progress Notes (Signed)
Office Visit Note  Patient: [name]             Date of Birth: [birth year]           MRN: [id number]             PCP: Arnoldo Morale, MD Referring: Arnoldo Morale, MD Visit Date: 10/20/2016 Occupation: '@GUAROCC'$ @    Subjective:  Pain hands.   History of Present Illness: [name] is a 57 y.o. female with history of osteoarthritis she returns today after her last visit in November 2016. She states she's been having pain and discomfort in her bilateral hands. She has difficulty making a fist and sometimes her fingers lock on her. She reports right second and fourth trigger finger and left second trigger finger. She's been also having discomfort in her elbows and her left hip. She was seen by Dr. Marlou Sa for her left hip pain. He did x-ray followed by MRI. She reports swelling in her bilateral hands.  Activities of Daily Living:  Patient reports morning stiffness for 45 minutes.   Patient Reports nocturnal pain.  Difficulty dressing/grooming: Denies Difficulty climbing stairs: Reports Difficulty getting out of chair: Denies Difficulty using hands for taps, buttons, cutlery, and/or writing: Reports   Review of Systems  Constitutional: Positive for fatigue. Negative for night sweats, weight gain, weight loss and weakness.  HENT: Negative for mouth sores, trouble swallowing, trouble swallowing, mouth dryness and nose dryness.   Eyes: Positive for dryness. Negative for pain, redness and visual disturbance.  Respiratory: Negative for cough, shortness of breath and difficulty breathing.   Cardiovascular: Negative for chest pain, palpitations, hypertension, irregular heartbeat and swelling in legs/feet.  Gastrointestinal: Positive for diarrhea. Negative for blood in stool and constipation.       Had seen GI in the past.  Endocrine: Negative for increased urination.  Genitourinary: Negative for vaginal dryness.  Musculoskeletal: Positive for arthralgias, joint pain, joint swelling and  morning stiffness. Negative for myalgias, muscle weakness, muscle tenderness and myalgias.  Skin: Positive for rash. Negative for color change, hair loss, skin tightness, ulcers and sensitivity to sunlight.  Allergic/Immunologic: Negative for susceptible to infections.  Neurological: Negative for dizziness, memory loss and night sweats.  Hematological: Negative for swollen glands.  Psychiatric/Behavioral: Positive for sleep disturbance. The patient is nervous/anxious.     PMFS History:  Patient Active Problem List   Diagnosis Date Noted  . 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 Primary osteoarthritis of both hands 10/06/2016  . History of gastroesophageal reflux (GERD) 10/06/2016  . History of COPD 10/06/2016  . History of anxiety 10/06/2016  . History of CHF (congestive heart failure) 10/06/2016  . History of hypertension 10/06/2016  . History of cardiac dysrhythmia 10/06/2016  . Primary osteoarthritis of both feet 10/06/2016  . Primary osteoarthritis of both knees 10/06/2016  . History of infection/ ankle post operative  10/06/2016  . Rheumatoid factor positive 10/06/2016  . De Quervain's disease (radial styloid tenosynovitis) 06/09/2016  . Tobacco abuse 05/23/2016  . Hyperglycemia without ketosis   . Bleeding hemorrhoids 11/03/2015  . Acute blood loss anemia 11/03/2015  . Hematochezia 11/01/2015  . Back pain with sciatica 09/08/2015  . Infection of bone of ankle ([place]) 07/15/2015  . Chronic systolic CHF (congestive heart failure) ([place]) 06/24/2015  . Chest pain 06/03/2015  . Hypertension   . Anxiety   . Gallstones   . GERD (gastroesophageal reflux disease)   . COPD (chronic obstructive pulmonary disease) ([place])   . Dysrhythmia   .  Rash   . Depression   . Diabetes mellitus without complication ([place])   . Post-operative state 12/10/2011    Past Medical History:  Diagnosis Date  . Anxiety   . Arthritis   . Chronic systolic heart failure (HCC)    EF 20-25% ECHO 05/2015  . COPD  (chronic obstructive pulmonary disease) ([place])   . Depression   . Diabetes mellitus without complication ([place])    Type II  . Dysrhythmia    pt unsure of name of arrythmia - " my heart rate will drop all of a sudden" -no current treatment - atrial flutter  . Fibrillation, atrial ([place])   . Gallstones   . GERD (gastroesophageal reflux disease)   . Headache(784.0)    migraines  . Hypertension   . Osteomyelitis (HCC)    R ankle  . Rash    arms    Family History  Problem Relation Age of Onset  . Lung cancer Father   . Heart attack Father 33    CABG x4  . Breast cancer Sister     Survivor    Past Surgical History:  Procedure Laterality Date  . ANKLE FRACTURE SURGERY Right 2015  . CARDIAC CATHETERIZATION N/A 06/24/2015   Procedure: Left Heart Cath and Coronary Angiography;  Surgeon: Belva Crome, MD;  Location: [facility];  Service: Cardiovascular;  Laterality: N/A;  . CARPAL TUNNEL RELEASE     bil  . CHOLECYSTECTOMY  11/24/2011   Procedure: LAPAROSCOPIC CHOLECYSTECTOMY WITH INTRAOPERATIVE CHOLANGIOGRAM;  Surgeon: Joyice Faster. Cornett, MD;  Location: [facility];  Service: General;  Laterality: N/A;  Laparoscopic Cholecystectomy with Cholangiogram  . ECTOPIC PREGNANCY SURGERY  many yrs ago  . HARDWARE REMOVAL Right 07/15/2015   Procedure: RIGHT ANKLE HARDWARE REMOVAL, PLACEMENT OF STIMULAN ANTIBIOTIC BEADS AND PREVENA WOUND VAC.;  Surgeon: Meredith Pel, MD;  Location: [facility];  Service: Orthopedics;  Laterality: Right;   Social History   Social History Narrative  . No narrative on file     Objective: Vital Signs: BP (!) 148/84   Pulse 78   Resp 14   Wt 160 lb (72.6 kg)   LMP 07/24/2011   BMI 25.06 kg/m    Physical Exam  Constitutional: She is oriented to person, place, and time. She appears well-developed and well-nourished.  HENT:  Head: Normocephalic and atraumatic.  Eyes: Conjunctivae and EOM are normal.  Neck: Normal range of motion.  Cardiovascular: Normal  rate, regular rhythm, normal heart sounds and intact distal pulses.   Pulmonary/Chest: Effort normal and breath sounds normal.  Abdominal: Soft. Bowel sounds are normal.  Lymphadenopathy:    She has no cervical adenopathy.  Neurological: She is alert and oriented to person, place, and time.  Skin: Skin is warm and dry. Capillary refill takes less than 2 seconds.  Psychiatric: She has a normal mood and affect. Her behavior is normal.  Nursing note and vitals reviewed.    Musculoskeletal Exam: C-spine and thoracic spine good range of motion she has some limitation with range of motion of her lumbar spine. She has tenderness over left piriformis muscle. Shoulder joints elbow joints wrist joints with good range of motion. She has DIP PIP thickening in her hands consistent with osteoarthritis. No MCP joint swelling was noted. No synovitis was noted. She has right second trigger finger. Hip joints knee joints ankles MTPs PIPs DIPs with good range of motion with no synovitis.  CDAI Exam: No CDAI exam completed.    Investigation: Findings:  June 2016:  Sed rate was 12, uric acid 4.2, rheumatoid factor 103, and ANA was negative.   01/10/2015 I did obtain x-rays of bilateral hands, 2 views today, which showed minimal PIP narrowing without any MCP changes, no erosive changes, and no intercarpal joint space changes were noted.  These findings are consistent with early osteoarthritis.   Bilateral feet x-rays showed minimal PIP narrowing, small bilateral calcaneal spur, and right fibular hardware, otherwise unremarkable.   Bilateral knee joint x-rays showed bilateral mild medial compartment narrowing, no chondrocalcinosis, and no patellofemoral narrowing.  These findings are consistent with mild osteoarthritis of the knee joints.   A pelvis x-ray also showed normal hip joints and normal SI joints.   July 2016, CBC was normal.  Comprehensive metabolic panel showed potassium of 3.4, glucose 222, CK 37,  and TSH was normal.  Uric acid 5.4.  CCP was negative.  UA showed 2+ glucose.  Hepatitis panel, SPEP and TB gold were negative.     02/19/2015 I did obtain an ultrasound examination of bilateral hands to evaluate for any underlying synovitis.  After informed consent was obtained, ultrasound examination of bilateral hands was performed using a 12 MHz, gray scale, and power Doppler.  Bilateral 2nd, 3rd and 5th MCP joints and bilateral wrist joints, both dorsal and volar aspects, were evaluated to look for synovitis or tenosynovitis.  The findings were that she had mild synovitis in her bilateral 3rd, 5th, and left 2nd MCP joint, and mild synovitis in her right wrist joint.  Right median nerve was 0.11 cm2 and left was also 0.11 cm2.  She has had bilateral carpal tunnel release in the past.  These findings were consistent with mild inflammatory arthritis.        Admission on 08/11/2016, Discharged on 08/12/2016  Component Date Value Ref Range Status  . Lipase 08/11/2016 34  11 - 51 U/L Final  . Sodium 08/11/2016 139  135 - 145 mmol/L Final  . Potassium 08/11/2016 4.0  3.5 - 5.1 mmol/L Final  . Chloride 08/11/2016 104  101 - 111 mmol/L Final  . CO2 08/11/2016 22  22 - 32 mmol/L Final  . Glucose, Bld 08/11/2016 205* 65 - 99 mg/dL Final  . BUN 08/11/2016 11  6 - 20 mg/dL Final  . Creatinine, Ser 08/11/2016 0.66  0.44 - 1.00 mg/dL Final  . Calcium 08/11/2016 9.4  8.9 - 10.3 mg/dL Final  . Total Protein 08/11/2016 7.3  6.5 - 8.1 g/dL Final  . Albumin 08/11/2016 4.0  3.5 - 5.0 g/dL Final  . AST 08/11/2016 36  15 - 41 U/L Final  . ALT 08/11/2016 28  14 - 54 U/L Final  . Alkaline Phosphatase 08/11/2016 106  38 - 126 U/L Final  . Total Bilirubin 08/11/2016 0.7  0.3 - 1.2 mg/dL Final  . GFR calc non Af Amer 08/11/2016 >60  >60 mL/min Final  . GFR calc Af Amer 08/11/2016 >60  >60 mL/min Final   Comment: (NOTE) The eGFR has been calculated using the CKD EPI equation. This calculation has not been  validated in all clinical situations. eGFR's persistently <60 mL/min signify possible Chronic Kidney Disease.   . Anion gap 08/11/2016 13  5 - 15 Final  . WBC 08/11/2016 13.8* 4.0 - 10.5 K/uL Final  . RBC 08/11/2016 5.65* 3.87 - 5.11 MIL/uL Final  . Hemoglobin 08/11/2016 16.9* 12.0 - 15.0 g/dL Final  . HCT 08/11/2016 49.2* 36.0 - 46.0 % Final  . MCV 08/11/2016 87.1  78.0 - 100.0 fL Final  . MCH 08/11/2016 29.9  26.0 - 34.0 pg Final  . MCHC 08/11/2016 34.3  30.0 - 36.0 g/dL Final  . RDW 08/11/2016 12.9  11.5 - 15.5 % Final  . Platelets 08/11/2016 187  150 - 400 K/uL Final  . Color, Urine 08/11/2016 YELLOW  YELLOW Final  . APPearance 08/11/2016 CLEAR  CLEAR Final  . Specific Gravity, Urine 08/11/2016 >1.030* 1.005 - 1.030 Final  . pH 08/11/2016 5.0  5.0 - 8.0 Final  . Glucose, UA 08/11/2016 100* NEGATIVE mg/dL Final  . Hgb urine dipstick 08/11/2016 NEGATIVE  NEGATIVE Final  . Bilirubin Urine 08/11/2016 MODERATE* NEGATIVE Final  . Ketones, ur 08/11/2016 NEGATIVE  NEGATIVE mg/dL Final  . Protein, ur 08/11/2016 100* NEGATIVE mg/dL Final  . Nitrite 08/11/2016 POSITIVE* NEGATIVE Final  . Leukocytes, UA 08/11/2016 NEGATIVE  NEGATIVE Final  . Preg Test, Ur 08/11/2016 NEGATIVE  NEGATIVE Final   Comment:        THE SENSITIVITY OF THIS METHODOLOGY IS >24 mIU/mL   . RBC / HPF 08/11/2016 NONE SEEN  0 - 5 RBC/hpf Final  . WBC, UA 08/11/2016 0-5  0 - 5 WBC/hpf Final  . Bacteria, UA 08/11/2016 RARE* NONE SEEN Final  . Squamous Epithelial / LPF 08/11/2016 0-5* NONE SEEN Final  . Mucous 08/11/2016 PRESENT   Final  . Hyaline Casts, UA 08/11/2016 PRESENT   Final  . Glucose-Capillary 08/11/2016 257* 65 - 99 mg/dL Final  . Comment 1 08/11/2016 Notify RN   Final  . Comment 2 08/11/2016 Document in Chart   Final    Imaging: Mr Lumbar Spine W/o Contrast  Result Date: 10/15/2016 CLINICAL DATA:  Left hip and groin pain and midline low back pain EXAM: MRI LUMBAR SPINE WITHOUT CONTRAST TECHNIQUE:  Multiplanar, multisequence MR imaging of the lumbar spine was performed. No intravenous contrast was administered. COMPARISON:  Lumbar spine radiograph 09/30/2016 FINDINGS: Segmentation:  Normal Alignment:  Normal Vertebrae: Hemangioma in the L5 vertebral body. No facet edema. No focal marrow lesions otherwise. No discitis osteomyelitis. Conus medullaris: Extends to the L1 level and appears normal. Paraspinal and other soft tissues: The visualized aorta, IVC and iliac vessels are normal. The visualized retroperitoneal organs and paraspinal soft tissues are normal. Disc levels: T12-L1: Normal disc space and facets. No spinal canal or neuroforaminal stenosis. L1-L2: Normal disc space and facets. No spinal canal or neuroforaminal stenosis. L2-L3: Small right foraminal protrusion with mild right foraminal narrowing. No spinal canal stenosis. L3-L4: Normal disc space and facets. No spinal canal or neuroforaminal stenosis. L4-L5: Normal disc space and facets. No spinal canal or neuroforaminal stenosis. L5-S1: Small central disc extrusion with minimal inferior migration. No stenosis. Visualized sacrum: Normal. IMPRESSION: Mild lumbar degenerative disc disease with mild right foraminal narrowing at L2-L3. No other stenosis. Electronically Signed   By: Ulyses Jarred M.D.   On: 10/15/2016 18:26   Xr Lumbar Spine 2-3 Views  Result Date: 09/30/2016 2 views lumbar spine reviewed.  AP and lateral.  No spondylolisthesis or compression fractures.  Mild facet arthritis is present.  Minimal degenerative disc disease between the lumbar vertebral bodies.  Sacroiliac joints appear normal   Speciality Comments: No specialty comments available.    Procedures:  No procedures performed Allergies: Biaxin [clarithromycin]; Clarithromycin; Lisinopril; Sulfa antibiotics; Chlorthalidone; Daptomycin; Amoxicillin-pot clavulanate; Aspirin; Cholestyramine; Dilaudid [hydromorphone hcl]; Lasix [furosemide]; and Latex   Assessment / Plan:      Visit Diagnoses: Primary osteoarthritis of both hands - she has osteoarthritis involving  her bilateral hands. She has decreased grip strength due to osteoarthritis. For the treatment of osteoarthritis joint protection and muscle strengthening was discussed. And use of Tylenol was discussed.  Right second trigger finger. She has some decreased range of motion due to some thickening of the flexor tendon. She has had trigger finger release on a different digit by Dr. Marlou Sa before. I've advised her to discuss that further with Dr. Marlou Sa.  Primary osteoarthritis of both feet: Proper fitting shoes were discussed.  Primary osteoarthritis of both knees: Muscle strengthening exercises were discussed.  Rheumatoid factor positive - History of positive RF, negative anti-CCP no features of rheumatoid arthritis on exam today.  DDD lumbar spine -  minimal with mild facet joint arthritis on MRI done 09/30/2016  Tobacco abuse: Patient reports that she has quit smoking although she's getting secondary smoking now. Is smoking cessation was emphasized.  Her other medical problems are listed as follows:  History of gastroesophageal reflux (GERD)  History of COPD  History of anxiety  History of CHF (congestive heart failure)  History of hypertension  History of cardiac dysrhythmia  History of infection/ ankle post operative     Orders: No orders of the defined types were placed in this encounter.  No orders of the defined types were placed in this encounter.   Face-to-face time spent with patient was 30 minutes. 50% of time was spent in counseling and coordination of care.  Follow-Up Instructions: Return if symptoms worsen or fail to improve, for OA, DDD.   Bo Merino, MD  Note - This record has been created using Editor, commissioning.  Chart creation errors have been sought, but may not always  have been located. Such creation errors do not reflect on  the standard of medical care.

## 2016-10-06 NOTE — Telephone Encounter (Signed)
This patient is on Dr Corliss Skains sch I am precharting, and this does not appear to be our patient. Is this a new patient, or is she on the incorrect schedule? She does see Dr August Saucer

## 2016-10-06 NOTE — Telephone Encounter (Signed)
Disregard request/ she has seen Dr Corliss Skains but has been a while, sorry

## 2016-10-15 ENCOUNTER — Ambulatory Visit
Admission: RE | Admit: 2016-10-15 | Discharge: 2016-10-15 | Disposition: A | Discharge status: Home / Self Care | Payer: Medicaid Other | Source: Ambulatory Visit | Attending: Orthopedic Surgery | Admitting: Orthopedic Surgery

## 2016-10-15 DIAGNOSIS — M5441 Lumbago with sciatica, right side: Principal | ICD-10-CM

## 2016-10-15 DIAGNOSIS — M5442 Lumbago with sciatica, left side: Principal | ICD-10-CM

## 2016-10-15 DIAGNOSIS — G8929 Other chronic pain: Secondary | ICD-10-CM

## 2016-10-18 DIAGNOSIS — M47816 Spondylosis without myelopathy or radiculopathy, lumbar region: Secondary | ICD-10-CM | POA: Insufficient documentation

## 2016-10-20 ENCOUNTER — Encounter: Payer: Self-pay | Admitting: Rheumatology

## 2016-10-20 ENCOUNTER — Ambulatory Visit (INDEPENDENT_AMBULATORY_CARE_PROVIDER_SITE_OTHER): Payer: Medicaid Other | Admitting: Rheumatology

## 2016-10-20 VITALS — BP 148/84 | HR 78 | Resp 14 | Wt 160.0 lb

## 2016-10-20 DIAGNOSIS — Z8709 Personal history of other diseases of the respiratory system: Secondary | ICD-10-CM | POA: Diagnosis not present

## 2016-10-20 DIAGNOSIS — M170 Bilateral primary osteoarthritis of knee: Secondary | ICD-10-CM

## 2016-10-20 DIAGNOSIS — M19041 Primary osteoarthritis, right hand: Secondary | ICD-10-CM

## 2016-10-20 DIAGNOSIS — M19042 Primary osteoarthritis, left hand: Secondary | ICD-10-CM

## 2016-10-20 DIAGNOSIS — R768 Other specified abnormal immunological findings in serum: Secondary | ICD-10-CM | POA: Diagnosis not present

## 2016-10-20 DIAGNOSIS — Z8659 Personal history of other mental and behavioral disorders: Secondary | ICD-10-CM

## 2016-10-20 DIAGNOSIS — M19072 Primary osteoarthritis, left ankle and foot: Secondary | ICD-10-CM

## 2016-10-20 DIAGNOSIS — Z720 Tobacco use: Secondary | ICD-10-CM

## 2016-10-20 DIAGNOSIS — Z8719 Personal history of other diseases of the digestive system: Secondary | ICD-10-CM

## 2016-10-20 DIAGNOSIS — M47816 Spondylosis without myelopathy or radiculopathy, lumbar region: Secondary | ICD-10-CM | POA: Diagnosis not present

## 2016-10-20 DIAGNOSIS — M19071 Primary osteoarthritis, right ankle and foot: Secondary | ICD-10-CM

## 2016-10-27 ENCOUNTER — Encounter (INDEPENDENT_AMBULATORY_CARE_PROVIDER_SITE_OTHER): Payer: Self-pay | Admitting: Orthopedic Surgery

## 2016-10-27 ENCOUNTER — Ambulatory Visit (INDEPENDENT_AMBULATORY_CARE_PROVIDER_SITE_OTHER): Payer: Medicaid Other | Admitting: Orthopedic Surgery

## 2016-10-27 DIAGNOSIS — M65321 Trigger finger, right index finger: Secondary | ICD-10-CM | POA: Diagnosis not present

## 2016-10-27 DIAGNOSIS — M549 Dorsalgia, unspecified: Secondary | ICD-10-CM

## 2016-10-27 DIAGNOSIS — M5430 Sciatica, unspecified side: Secondary | ICD-10-CM | POA: Diagnosis not present

## 2016-10-27 DIAGNOSIS — M47816 Spondylosis without myelopathy or radiculopathy, lumbar region: Secondary | ICD-10-CM

## 2016-10-28 ENCOUNTER — Telehealth (INDEPENDENT_AMBULATORY_CARE_PROVIDER_SITE_OTHER): Payer: Self-pay | Admitting: Orthopedic Surgery

## 2016-10-28 NOTE — Telephone Encounter (Signed)
Patient called asking for a refill on her percocet 325. CB # [phone]

## 2016-10-28 NOTE — Telephone Encounter (Signed)
Patient given #30 tablets on 09/30/16. Asking for refill. Please advise.

## 2016-10-28 NOTE — Progress Notes (Signed)
Office Visit Note   Patient: [name]           Date of Birth: 09-02-1960           MRN: [id number] Visit Date: 10/27/2016 Requested by: Jaclyn Shaggy, MD [address] [place], [place] [address] PCP: Jaclyn Shaggy, MD  Subjective: Chief Complaint  Patient presents with  . Lower Back - Follow-up    HPI: Laduke is a 56 year old patient with low back pain as well as index finger triggering in both hands right worsen left.  Since of [facility] she's had an MRI scan of the lumbar spine which is fairly unremarkable for any right-sided symptoms.  She localizes most of her pain to the left sacroiliac joint.  She also reports significant and debilitating locking of the right index finger.  MRI scan reviewed shows mild L2-3 foraminal narrowing.  No real nerve compression.  Patient has had previous trigger finger release and is done well.  She states that every time she bends her right index finger which is her dominant hand her finger will lock up and it is painful to unlock              ROS: All systems reviewed are negative as they relate to the chief complaint within the history of present illness.  Patient denies  fevers or chills.   Assessment & Plan: Visit Diagnoses:  1. Back pain with sciatica   2. DDD lumbar spine     Plan: Impression is right index finger triggering which is been going on for months and would likely be refractory to any type of nonoperative management.  She has swelling and pain over the A1 pulley.  Plan is surgical release of the A1 pulley of the right index finger.  Risks and benefits discussed including not limited to infection or vessel damage finger stiffness and recurrent triggering.  Patient understands risks and benefits and wishes to proceed.  All questions answered.  In regards to her back think it would be worthwhile having Dr. Alvester Morin perform a sacroiliac joint injection.  Lumbar spine MRI scan pretty unremarkable.  She is focally tender over the  sacroiliac joint, think an injection is warranted.  If that doesn't help then MRI scan of the pelvis would be our next option.  Follow-Up Instructions: No Follow-up on file.   Orders:  Orders Placed This Encounter  Procedures  . Ambulatory referral to Physical Medicine Rehab   No orders of the defined types were placed in this encounter.     Procedures: No procedures performed   Clinical Data: No additional findings.  Objective: Vital Signs: LMP 07/24/2011   Physical Exam:   Constitutional: Patient appears well-developed HEENT:  Head: Normocephalic Eyes:EOM are normal Neck: Normal range of motion Cardiovascular: Normal rate Pulmonary/chest: Effort normal Neurologic: Patient is alert Skin: Skin is warm Psychiatric: Patient has normal mood and affect    Ortho Exam: Orthopedic exam demonstrates tenderness over the A1 pulley of the index finger on the right and left-hand side.  Triggering is present.  Motor sensory function is intact to the finger.  Radial pulses intact.  Patient has sacroiliac joint tenderness with no nerve retention signs.  No paresthesias L1 S1 bilaterally.  No other masses lymph adenopathy or skin changes noted in the back region.  She does have a small rash at the superior aspect of her gluteal crease which looks like a Sturge-Weber type skin AV malformation.  Specialty Comments:  No specialty comments available.  Imaging: No results found.   PMFS History: Patient Active Problem List   Diagnosis Date Noted  . 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 Primary osteoarthritis of both hands 10/06/2016  . History of gastroesophageal reflux (GERD) 10/06/2016  . History of COPD 10/06/2016  . History of anxiety 10/06/2016  . History of CHF (congestive heart failure) 10/06/2016  . History of hypertension 10/06/2016  . History of cardiac dysrhythmia 10/06/2016  . Primary osteoarthritis of both feet 10/06/2016  . Primary osteoarthritis of both knees 10/06/2016    . History of infection/ ankle post operative  10/06/2016  . Rheumatoid factor positive 10/06/2016  . De Quervain's disease (radial styloid tenosynovitis) 06/09/2016  . Tobacco abuse 05/23/2016  . Hyperglycemia without ketosis   . Bleeding hemorrhoids 11/03/2015  . Acute blood loss anemia 11/03/2015  . Hematochezia 11/01/2015  . Back pain with sciatica 09/08/2015  . Infection of bone of ankle (HCC) 07/15/2015  . Chronic systolic CHF (congestive heart failure) (HCC) 06/24/2015  . Chest pain 06/03/2015  . Hypertension   . Anxiety   . Gallstones   . GERD (gastroesophageal reflux disease)   . COPD (chronic obstructive pulmonary disease) (HCC)   . Dysrhythmia   . Rash   . Depression   . Diabetes mellitus without complication ([facility])   . Post-operative state 12/10/2011   Past Medical History:  Diagnosis Date  . Anxiety   . Arthritis   . Chronic systolic heart failure (HCC)    EF 20-25% ECHO 05/2015  . COPD (chronic obstructive pulmonary disease) ([facility])   . Depression   . Diabetes mellitus without complication (HCC)    Type II  . Dysrhythmia    pt unsure of name of arrythmia - " my heart rate will drop all of a sudden" -no current treatment - atrial flutter  . Fibrillation, atrial (HCC)   . Gallstones   . GERD (gastroesophageal reflux disease)   . Headache(784.0)    migraines  . Hypertension   . Osteomyelitis (HCC)    R ankle  . Rash    arms    Family History  Problem Relation Age of Onset  . Lung cancer Father   . Heart attack Father 31    CABG x4  . Breast cancer Sister     Survivor     Past Surgical History:  Procedure Laterality Date  . ANKLE FRACTURE SURGERY Right 2015  . CARDIAC CATHETERIZATION N/A 06/24/2015   Procedure: Left Heart Cath and Coronary Angiography;  Surgeon: [name], MD;  Location: [facility];  Service: Cardiovascular;  Laterality: N/A;  . CARPAL TUNNEL RELEASE     bil  . CHOLECYSTECTOMY  11/24/2011   Procedure: LAPAROSCOPIC  CHOLECYSTECTOMY WITH INTRAOPERATIVE CHOLANGIOGRAM;  Surgeon: Clovis Pu. Cornett, MD;  Location: [facility];  Service: General;  Laterality: N/A;  Laparoscopic Cholecystectomy with Cholangiogram  . ECTOPIC PREGNANCY SURGERY  many yrs ago  . HARDWARE REMOVAL Right 07/15/2015   Procedure: RIGHT ANKLE HARDWARE REMOVAL, PLACEMENT OF STIMULAN ANTIBIOTIC BEADS AND PREVENA WOUND VAC.;  Surgeon: [name], MD;  Location: [facility];  Service: Orthopedics;  Laterality: Right;   Social History   Occupational History  . Not on file.   Social History Main Topics  . Smoking status: Former Smoker    Packs/day: 0.25    Years: 20.00    Types: Cigarettes    Start date: 09/19/2016  . Smokeless tobacco: Never Used     Comment: 6 cigs daily  . Alcohol use  No  . Drug use: No  . Sexual activity: Not Currently    Birth control/ protection: Post-menopausal

## 2016-10-30 NOTE — Telephone Encounter (Signed)
I will rf after surgery monday

## 2016-11-01 DIAGNOSIS — M65321 Trigger finger, right index finger: Secondary | ICD-10-CM | POA: Diagnosis not present

## 2016-11-04 ENCOUNTER — Other Ambulatory Visit: Payer: Self-pay | Admitting: Internal Medicine

## 2016-11-04 LAB — HM DIABETES EYE EXAM

## 2016-11-04 MED FILL — SPIRIVA 18 MCG CP-HANDIHALE: 18 | 30 days supply | Qty: 30 | Fill #1

## 2016-11-04 MED FILL — FLUoxetine HCL 40 MG CAPS: 40 | 30 days supply | Qty: 30 | Fill #4

## 2016-11-04 MED FILL — VALSARTAN 160 MG TABLET: 160 | 30 days supply | Qty: 30 | Fill #4

## 2016-11-04 MED FILL — metFORMIN HCL 500 MG TABS: 500 | 30 days supply | Qty: 30 | Fill #4

## 2016-11-04 MED FILL — BISOPROLOL FUMARATE 5 MG TA: 5 | 30 days supply | Qty: 15 | Fill #4

## 2016-11-08 MED FILL — PROVENTIL HFA 108 (90 BASE): 108 (90 BAS | 25 days supply | Qty: 7 | Fill #0

## 2016-11-10 ENCOUNTER — Ambulatory Visit (INDEPENDENT_AMBULATORY_CARE_PROVIDER_SITE_OTHER): Payer: Medicaid Other | Admitting: Orthopedic Surgery

## 2016-11-10 ENCOUNTER — Encounter (INDEPENDENT_AMBULATORY_CARE_PROVIDER_SITE_OTHER): Payer: Self-pay | Admitting: Orthopedic Surgery

## 2016-11-10 DIAGNOSIS — Z9889 Other specified postprocedural states: Secondary | ICD-10-CM

## 2016-11-10 MED ORDER — OXYCODONE-ACETAMINOPHEN 5-325 MG PO TABS: 1.0000 | ORAL_TABLET | Freq: Three times a day (TID) | ORAL | 0 refills | Status: DC | PRN

## 2016-11-13 NOTE — Progress Notes (Signed)
Post-Op Visit Note   Patient: [name]           Date of Birth: 1960-08-05           MRN: [id number] Visit Date: 11/10/2016 PCP: Jaclyn Shaggy, MD   Assessment & Plan:  Chief Complaint:  Chief Complaint  Patient presents with  . postop    trigger finger release   Visit Diagnoses:  1. S/P trigger finger release     Plan: [name] is a 56 year old patient who is now a week out from trigger finger release.  She's been doing well.  Incision is intact.  Sutures are removed.  Still is lacking a little bit of range of motion of the finger.  Plan is for her to continue to work on range of motion exercises but avoid heavy lifting for the next 4 weeks.  See her back as needed.  Pain medicine refill 1 but should not be refilled further  Follow-Up Instructions: Return if symptoms worsen or fail to improve.   Orders:  No orders of the defined types were placed in this encounter.  Meds ordered this encounter  Medications  . oxyCODONE-acetaminophen (PERCOCET/ROXICET) 5-325 MG tablet    Sig: Take 1 tablet by mouth every 8 (eight) hours as needed for severe pain.    Dispense:  20 tablet    Refill:  0    Imaging: No results found.  PMFS History: Patient Active Problem List   Diagnosis Date Noted  . 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 Primary osteoarthritis of both hands 10/06/2016  . History of gastroesophageal reflux (GERD) 10/06/2016  . History of COPD 10/06/2016  . History of anxiety 10/06/2016  . History of CHF (congestive heart failure) 10/06/2016  . History of hypertension 10/06/2016  . History of cardiac dysrhythmia 10/06/2016  . Primary osteoarthritis of both feet 10/06/2016  . Primary osteoarthritis of both knees 10/06/2016  . History of infection/ ankle post operative  10/06/2016  . Rheumatoid factor positive 10/06/2016  . De Quervain's disease (radial styloid tenosynovitis) 06/09/2016  . Tobacco abuse 05/23/2016  . Hyperglycemia without ketosis   . Bleeding  hemorrhoids 11/03/2015  . Acute blood loss anemia 11/03/2015  . Hematochezia 11/01/2015  . Back pain with sciatica 09/08/2015  . Infection of bone of ankle (HCC) 07/15/2015  . Chronic systolic CHF (congestive heart failure) (HCC) 06/24/2015  . Chest pain 06/03/2015  . Hypertension   . Anxiety   . Gallstones   . GERD (gastroesophageal reflux disease)   . COPD (chronic obstructive pulmonary disease) (HCC)   . Dysrhythmia   . Rash   . Depression   . Diabetes mellitus without complication (HCC)   . Post-operative state 12/10/2011   Past Medical History:  Diagnosis Date  . Anxiety   . Arthritis   . Chronic systolic heart failure (HCC)    EF 20-25% ECHO 05/2015  . COPD (chronic obstructive pulmonary disease) (HCC)   . Depression   . Diabetes mellitus without complication (HCC)    Type II  . Dysrhythmia    pt unsure of name of arrythmia - " my heart rate will drop all of a sudden" -no current treatment - atrial flutter  . Fibrillation, atrial (HCC)   . Gallstones   . GERD (gastroesophageal reflux disease)   . Headache(784.0)    migraines  . Hypertension   . Osteomyelitis (HCC)    R ankle  . Rash    arms    Family History  Problem Relation Age  of Onset  . Lung cancer Father   . Heart attack Father 110       CABG x4  . Breast cancer Sister        Survivor     Past Surgical History:  Procedure Laterality Date  . ANKLE FRACTURE SURGERY Right 2015  . CARDIAC CATHETERIZATION N/A 06/24/2015   Procedure: Left Heart Cath and Coronary Angiography;  Surgeon: [name], MD;  Location: [facility] INVASIVE CV LAB;  Service: Cardiovascular;  Laterality: N/A;  . CARPAL TUNNEL RELEASE     bil  . CHOLECYSTECTOMY  11/24/2011   Procedure: LAPAROSCOPIC CHOLECYSTECTOMY WITH INTRAOPERATIVE CHOLANGIOGRAM;  Surgeon: Clovis Pu. Cornett, MD;  Location: [facility] ORS;  Service: General;  Laterality: N/A;  Laparoscopic Cholecystectomy with Cholangiogram  . ECTOPIC PREGNANCY SURGERY  many yrs ago  . HARDWARE  REMOVAL Right 07/15/2015   Procedure: RIGHT ANKLE HARDWARE REMOVAL, PLACEMENT OF STIMULAN ANTIBIOTIC BEADS AND PREVENA WOUND VAC.;  Surgeon: [name], MD;  Location: [facility] OR;  Service: Orthopedics;  Laterality: Right;   Social History   Occupational History  . Not on file.   Social History Main Topics  . Smoking status: Former Smoker    Packs/day: 0.25    Years: 20.00    Types: Cigarettes    Start date: 09/19/2016  . Smokeless tobacco: Never Used     Comment: 6 cigs daily  . Alcohol use No  . Drug use: No  . Sexual activity: Not Currently    Birth control/ protection: Post-menopausal

## 2016-11-15 ENCOUNTER — Ambulatory Visit (INDEPENDENT_AMBULATORY_CARE_PROVIDER_SITE_OTHER): Payer: Medicaid Other | Admitting: Physical Medicine and Rehabilitation

## 2016-11-17 ENCOUNTER — Telehealth: Payer: Self-pay | Admitting: Family Medicine

## 2016-11-17 NOTE — Telephone Encounter (Signed)
Pt. Came to facility requesting a refill on esomeprazole (NEXIUM) 20 MG capsule And glimepiride (AMARYL) 4 MG tablet. Please f/u with pt.

## 2016-11-18 MED ORDER — ESOMEPRAZOLE MAGNESIUM 20 MG PO CPDR: 20.0000 mg | DELAYED_RELEASE_CAPSULE | Freq: Every day | ORAL | 0 refills | Status: DC

## 2016-11-18 MED ORDER — GLIMEPIRIDE 4 MG PO TABS: 4.0000 mg | ORAL_TABLET | Freq: Every day | ORAL | 0 refills | Status: DC

## 2016-11-18 MED FILL — GLIMEPIRIDE 4 MG TABLET: 4 | 30 days supply | Qty: 30 | Fill #0

## 2016-11-18 NOTE — Telephone Encounter (Signed)
Refilled requested medications x 30 days - patient must have office visit for any further refills.

## 2016-11-23 ENCOUNTER — Other Ambulatory Visit: Payer: Self-pay | Admitting: Internal Medicine

## 2016-11-24 ENCOUNTER — Ambulatory Visit: Payer: Medicaid Other | Attending: Family Medicine | Admitting: Family Medicine

## 2016-11-24 ENCOUNTER — Encounter: Payer: Self-pay | Admitting: Family Medicine

## 2016-11-24 VITALS — BP 144/80 | HR 61 | Temp 98.2°F | Wt 156.2 lb

## 2016-11-24 DIAGNOSIS — Z8719 Personal history of other diseases of the digestive system: Secondary | ICD-10-CM

## 2016-11-24 DIAGNOSIS — I110 Hypertensive heart disease with heart failure: Secondary | ICD-10-CM | POA: Insufficient documentation

## 2016-11-24 DIAGNOSIS — E119 Type 2 diabetes mellitus without complications: Secondary | ICD-10-CM | POA: Insufficient documentation

## 2016-11-24 DIAGNOSIS — I10 Essential (primary) hypertension: Secondary | ICD-10-CM

## 2016-11-24 DIAGNOSIS — J410 Simple chronic bronchitis: Secondary | ICD-10-CM

## 2016-11-24 DIAGNOSIS — Z7984 Long term (current) use of oral hypoglycemic drugs: Secondary | ICD-10-CM | POA: Diagnosis not present

## 2016-11-24 DIAGNOSIS — I5022 Chronic systolic (congestive) heart failure: Secondary | ICD-10-CM | POA: Diagnosis not present

## 2016-11-24 DIAGNOSIS — K580 Irritable bowel syndrome with diarrhea: Secondary | ICD-10-CM | POA: Insufficient documentation

## 2016-11-24 DIAGNOSIS — K219 Gastro-esophageal reflux disease without esophagitis: Secondary | ICD-10-CM | POA: Insufficient documentation

## 2016-11-24 DIAGNOSIS — H8110 Benign paroxysmal vertigo, unspecified ear: Secondary | ICD-10-CM | POA: Insufficient documentation

## 2016-11-24 DIAGNOSIS — H8113 Benign paroxysmal vertigo, bilateral: Secondary | ICD-10-CM

## 2016-11-24 DIAGNOSIS — L299 Pruritus, unspecified: Secondary | ICD-10-CM | POA: Insufficient documentation

## 2016-11-24 DIAGNOSIS — F419 Anxiety disorder, unspecified: Secondary | ICD-10-CM | POA: Diagnosis not present

## 2016-11-24 DIAGNOSIS — K648 Other hemorrhoids: Secondary | ICD-10-CM | POA: Diagnosis not present

## 2016-11-24 DIAGNOSIS — Z9114 Patient's other noncompliance with medication regimen: Secondary | ICD-10-CM | POA: Diagnosis not present

## 2016-11-24 DIAGNOSIS — Z7982 Long term (current) use of aspirin: Secondary | ICD-10-CM | POA: Diagnosis not present

## 2016-11-24 DIAGNOSIS — Z79899 Other long term (current) drug therapy: Secondary | ICD-10-CM | POA: Diagnosis not present

## 2016-11-24 DIAGNOSIS — R102 Pelvic and perineal pain: Secondary | ICD-10-CM | POA: Insufficient documentation

## 2016-11-24 DIAGNOSIS — J449 Chronic obstructive pulmonary disease, unspecified: Secondary | ICD-10-CM | POA: Insufficient documentation

## 2016-11-24 DIAGNOSIS — Z880 Allergy status to penicillin: Secondary | ICD-10-CM | POA: Diagnosis not present

## 2016-11-24 LAB — GLUCOSE, POCT (MANUAL RESULT ENTRY)
POC GLUCOSE: 443 mg/dL — AB (ref 70–99)
POC Glucose: 308 mg/dl — AB (ref 70–99)

## 2016-11-24 LAB — POCT GLYCOSYLATED HEMOGLOBIN (HGB A1C): HEMOGLOBIN A1C: 11.3

## 2016-11-24 MED ORDER — ALBUTEROL SULFATE HFA 108 (90 BASE) MCG/ACT IN AERS: 2.0000 | INHALATION_SPRAY | Freq: Four times a day (QID) | RESPIRATORY_TRACT | 3 refills | Status: DC | PRN

## 2016-11-24 MED ORDER — ESOMEPRAZOLE MAGNESIUM 20 MG PO CPDR: 20.0000 mg | DELAYED_RELEASE_CAPSULE | Freq: Every day | ORAL | 5 refills | Status: DC

## 2016-11-24 MED ORDER — DICYCLOMINE HCL 10 MG PO CAPS: 10.0000 mg | ORAL_CAPSULE | Freq: Three times a day (TID) | ORAL | 5 refills | Status: DC

## 2016-11-24 MED ORDER — GLUCOSE BLOOD VI STRP: ORAL_STRIP | 12 refills | Status: DC

## 2016-11-24 MED ORDER — FLUOXETINE HCL 40 MG PO CAPS: 40.0000 mg | ORAL_CAPSULE | Freq: Every day | ORAL | 5 refills | Status: DC

## 2016-11-24 MED ORDER — HYDROXYZINE HCL 10 MG PO TABS: 10.0000 mg | ORAL_TABLET | Freq: Every day | ORAL | 5 refills | Status: DC

## 2016-11-24 MED ORDER — VALSARTAN 160 MG PO TABS: 160.0000 mg | ORAL_TABLET | Freq: Every day | ORAL | 5 refills | Status: DC

## 2016-11-24 MED ORDER — ACCU-CHEK SOFTCLIX LANCET DEV MISC: 5 refills | Status: DC

## 2016-11-24 MED ORDER — PRAVASTATIN SODIUM 40 MG PO TABS: 40.0000 mg | ORAL_TABLET | Freq: Every day | ORAL | 5 refills | Status: DC

## 2016-11-24 MED ORDER — ACCU-CHEK AVIVA DEVI: 0 refills | Status: DC

## 2016-11-24 MED ORDER — TIOTROPIUM BROMIDE MONOHYDRATE 18 MCG IN CAPS: 18.0000 ug | ORAL_CAPSULE | Freq: Every day | RESPIRATORY_TRACT | 5 refills | Status: DC

## 2016-11-24 MED ORDER — GLIMEPIRIDE 4 MG PO TABS: 8.0000 mg | ORAL_TABLET | Freq: Every day | ORAL | 5 refills | Status: DC

## 2016-11-24 MED ORDER — MECLIZINE HCL 25 MG PO TABS: 25.0000 mg | ORAL_TABLET | Freq: Three times a day (TID) | ORAL | 3 refills | Status: DC | PRN

## 2016-11-24 MED ORDER — METFORMIN HCL 500 MG PO TABS: 500.0000 mg | ORAL_TABLET | Freq: Every day | ORAL | 5 refills | Status: DC

## 2016-11-24 MED ORDER — BISOPROLOL FUMARATE 5 MG PO TABS: 2.5000 mg | ORAL_TABLET | Freq: Every day | ORAL | 5 refills | Status: DC

## 2016-11-24 MED ORDER — INSULIN ASPART 100 UNIT/ML ~~LOC~~ SOLN
20.0000 [IU] | Freq: Once | SUBCUTANEOUS | Status: AC
  Administered 2016-11-24: 20 [IU] via SUBCUTANEOUS

## 2016-11-24 MED FILL — DICYCLOMINE 10 MG CAPSULE: 10 | 30 days supply | Qty: 90 | Fill #0 | Status: TO

## 2016-11-24 MED FILL — ACCU-CHEK AVIVA PLUS METER: W/DEVICE | 30 days supply | Qty: 1 | Fill #0

## 2016-11-24 MED FILL — TRAVEL SICKNESS 25 MG TAB C: 25 | 20 days supply | Qty: 60 | Fill #0 | Status: TO

## 2016-11-24 MED FILL — ACCU-CHEK AVIVA PLUS TEST S: 30 days supply | Qty: 100 | Fill #0 | Status: TO

## 2016-11-24 MED FILL — ACCU-CHEK SOFTCLIX LANCETS: 30 days supply | Qty: 100 | Fill #0 | Status: TO

## 2016-11-24 MED FILL — hydrOXYzine HCL 10 MG TABS: 10 | 30 days supply | Qty: 30 | Fill #0 | Status: TO

## 2016-11-24 MED FILL — PRAVASTATIN NA 40 MG TAB: 40 | 30 days supply | Qty: 30 | Fill #0 | Status: TO

## 2016-11-24 NOTE — Patient Instructions (Signed)
Irritable Bowel Syndrome, Adult Irritable bowel syndrome (IBS) is not one specific disease. It is a group of symptoms that affects the organs responsible for digestion (gastrointestinal or GI tract). To regulate how your GI tract works, your body sends signals back and forth between your intestines and your brain. If you have IBS, there may be a problem with these signals. As a result, your GI tract does not function normally. Your intestines may become more sensitive and overreact to certain things. This is especially true when you eat certain foods or when you are under stress. There are four types of IBS. These may be determined based on the consistency of your stool:  IBS with diarrhea.  IBS with constipation.  Mixed IBS.  Unsubtyped IBS. It is important to know which type of IBS you have. Some treatments are more likely to be helpful for certain types of IBS. What are the causes? The exact cause of IBS is not known. What increases the risk? You may have a higher risk of IBS if:  You are a woman.  You are younger than 55 years old.  You have a family history of IBS.  You have mental health problems.  You have had bacterial infection of your GI tract. What are the signs or symptoms? Symptoms of IBS vary from person to person. The main symptom is abdominal pain or discomfort. Additional symptoms usually include one or more of the following:  Diarrhea, constipation, or both.  Abdominal swelling or bloating.  Feeling full or sick after eating a small or regular-size meal.  Frequent gas.  Mucus in the stool.  A feeling of having more stool left after a bowel movement. Symptoms tend to come and go. They may be associated with stress, psychiatric conditions, or nothing at all. How is this diagnosed? There is no specific test to diagnose IBS. Your health care provider will make a diagnosis based on a physical exam, medical history, and your symptoms. You may have other tests to  rule out other conditions that may be causing your symptoms. These may include:  Blood tests.  X-rays.  CT scan.  Endoscopy and colonoscopy. This is a test in which your GI tract is viewed with a long, thin, flexible tube. How is this treated? There is no cure for IBS, but treatment can help relieve symptoms. IBS treatment often includes:  Changes to your diet, such as:  Eating more fiber.  Avoiding foods that cause symptoms.  Drinking more water.  Eating regular, medium-sized portioned meals.  Medicines. These may include:  Fiber supplements if you have constipation.  Medicine to control diarrhea (antidiarrheal medicines).  Medicine to help control muscle spasms in your GI tract (antispasmodic medicines).  Medicines to help with any mental health issues, such as antidepressants or tranquilizers.  Therapy.  Talk therapy may help with anxiety, depression, or other mental health issues that can make IBS symptoms worse.  Stress reduction.  Managing your stress can help keep symptoms under control. Follow these instructions at home:  Take medicines only as directed by your health care provider.  Eat a healthy diet.  Avoid foods and drinks with added sugar.  Include more whole grains, fruits, and vegetables gradually into your diet. This may be especially helpful if you have IBS with constipation.  Avoid any foods and drinks that make your symptoms worse. These may include dairy products and caffeinated or carbonated drinks.  Do not eat large meals.  Drink enough fluid to keep your urine   clear or pale yellow.  Exercise regularly. Ask your health care provider for recommendations of good activities for you.  Keep all follow-up visits as directed by your health care provider. This is important. Contact a health care provider if:  You have constant pain.  You have trouble or pain with swallowing.  You have worsening diarrhea. Get help right away if:  You  have severe and worsening abdominal pain.  You have diarrhea and:  You have a rash, stiff neck, or severe headache.  You are irritable, sleepy, or difficult to awaken.  You are weak, dizzy, or extremely thirsty.  You have bright red blood in your stool or you have black tarry stools.  You have unusual abdominal swelling that is painful.  You vomit continuously.  You vomit blood (hematemesis).  You have both abdominal pain and a fever. This information is not intended to replace advice given to you by your health care provider. Make sure you discuss any questions you have with your health care provider. Document Released: 06/14/2005 Document Revised: 11/14/2015 Document Reviewed: 03/01/2014 Elsevier Interactive Patient Education  2017 [employer]  

## 2016-11-24 NOTE — Progress Notes (Signed)
Subjective:  Patient ID: [name], female    DOB: 06-09-1961  Age: 56 y.o. MRN: [id number]  CC: Diabetes   HPI [name] is a 56 year old female with a history of type 2 diabetes mellitus (A1c 11.3), hypertension, CHF (EF 55-60%  2-D echo of 09/2016 which has improved from 25%-30% previously), anxiety, GERD who presents today for follow-up visit.  Her A1c is 11.3 today and she endorses being out of glimepiride for the last 1 month; her blood sugar is elevated at 443 in the clinic today and 20 units of NovoLog has been administered. She is up-to-date on eye exams which she had 10/31/16. Denies numbness in extremities.  She complains of diarrhea and moves her bowels several times a day and this has resulted in worsening of her hemorrhoids. She was previously taken off metformin but diarrhea persisted and so metformin was reinstated. Her Hemorrhoids do not bleed.  Complains of chronic left lower quadrant and pelvic pain for the last 6 months which have been intermittent. She is postmenopausal and has not noticed any vaginal bleed. Her gastroesophageal reflux and anxiety are well controlled on current regimen.  Past Medical History:  Diagnosis Date  . Anxiety   . Arthritis   . Chronic systolic heart failure (HCC)    EF 20-25% ECHO 05/2015  . COPD (chronic obstructive pulmonary disease) (HCC)   . Depression   . Diabetes mellitus without complication (HCC)    Type II  . Dysrhythmia    pt unsure of name of arrythmia - " my heart rate will drop all of a sudden" -no current treatment - atrial flutter  . Fibrillation, atrial (HCC)   . Gallstones   . GERD (gastroesophageal reflux disease)   . Headache(784.0)    migraines  . Hypertension   . Osteomyelitis (HCC)    R ankle  . Rash    arms    Past Surgical History:  Procedure Laterality Date  . ANKLE FRACTURE SURGERY Right 2015  . CARDIAC CATHETERIZATION N/A 06/24/2015   Procedure: Left Heart Cath and Coronary Angiography;   Surgeon: [name], MD;  Location: [facility] INVASIVE CV LAB;  Service: Cardiovascular;  Laterality: N/A;  . CARPAL TUNNEL RELEASE     bil  . CHOLECYSTECTOMY  11/24/2011   Procedure: LAPAROSCOPIC CHOLECYSTECTOMY WITH INTRAOPERATIVE CHOLANGIOGRAM;  Surgeon: Clovis Pu. Cornett, MD;  Location: [facility] ORS;  Service: General;  Laterality: N/A;  Laparoscopic Cholecystectomy with Cholangiogram  . ECTOPIC PREGNANCY SURGERY  many yrs ago  . HARDWARE REMOVAL Right 07/15/2015   Procedure: RIGHT ANKLE HARDWARE REMOVAL, PLACEMENT OF STIMULAN ANTIBIOTIC BEADS AND PREVENA WOUND VAC.;  Surgeon: [name], MD;  Location: [facility] OR;  Service: Orthopedics;  Laterality: Right;    Allergies  Allergen Reactions  . Biaxin [Clarithromycin] Anaphylaxis and Swelling  . Clarithromycin Shortness Of Breath and Swelling  . Lisinopril Swelling and Cough    Lip edema  . Sulfa Antibiotics Anaphylaxis, Shortness Of Breath, Swelling and Hypertension  . Chlorthalidone Nausea And Vomiting and Other (See Comments)    Clammy, Tachycardia, Headache   . Daptomycin Nausea And Vomiting  . Amoxicillin-Pot Clavulanate Diarrhea    Has patient had a PCN reaction causing immediate rash, facial/tongue/throat swelling, SOB or lightheadedness with hypotension:No Has patient had a PCN reaction causing severe rash involving mucus membranes or skin necrosis:No Has patient had a PCN reaction that required hospitalization:No Has patient had a PCN reaction occurring within the last 10 years:Yes If all of the above answers  are "NO", then may proceed with Cephalosporin use.   . Aspirin Nausea And Vomiting and Rash    On 325mg  dosage  . Cholestyramine Nausea And Vomiting  . Dilaudid [Hydromorphone Hcl] Nausea And Vomiting  . Lasix [Furosemide] Nausea And Vomiting and Other (See Comments)    Headache   . Latex Hives and Rash     Outpatient Medications Prior to Visit  Medication Sig Dispense Refill  . albuterol (PROVENTIL HFA;VENTOLIN HFA)  108 (90 Base) MCG/ACT inhaler Inhale 2 puffs into the lungs every 6 (six) hours as needed for wheezing or shortness of breath. For wheezing 1 Inhaler 11  . bisoprolol (ZEBETA) 5 MG tablet Take 0.5 tablets (2.5 mg total) by mouth daily. 30 tablet 5  . FLUoxetine (PROZAC) 40 MG capsule Take 1 capsule (40 mg total) by mouth at bedtime. 90 capsule 3  . hydrOXYzine (ATARAX/VISTARIL) 10 MG tablet Take 2 tablets (20 mg total) by mouth daily as needed for itching. (Patient taking differently: Take 10 mg by mouth daily. ) 60 tablet 3  . meclizine (ANTIVERT) 25 MG tablet Take 1 tablet (25 mg total) by mouth 3 (three) times daily as needed for dizziness. (Patient taking differently: Take 25 mg by mouth 3 (three) times daily. ) 60 tablet 3  . metFORMIN (GLUCOPHAGE) 500 MG tablet Take 1 tablet (500 mg total) by mouth daily with breakfast. 90 tablet 3  . tiotropium (SPIRIVA) 18 MCG inhalation capsule Place 1 capsule (18 mcg total) into inhaler and inhale daily. 30 capsule 12  . valsartan (DIOVAN) 160 MG tablet Take 1 tablet (160 mg total) by mouth daily with breakfast. 90 tablet 3  . acetaminophen (TYLENOL) 500 MG tablet Take 1,000 mg by mouth daily as needed (pain).    Marland Kitchen aspirin 81 MG EC tablet Take 1 tablet (81 mg total) by mouth daily. (Patient not taking: Reported on 11/24/2016) 30 tablet 2  . esomeprazole (NEXIUM) 20 MG capsule Take 1 capsule (20 mg total) by mouth daily. (Patient not taking: Reported on 11/24/2016) 30 capsule 0  . glimepiride (AMARYL) 4 MG tablet Take 1 tablet (4 mg total) by mouth daily with breakfast. (Patient not taking: Reported on 11/24/2016) 30 tablet 0  . ondansetron (ZOFRAN ODT) 8 MG disintegrating tablet Take 1 tablet (8 mg total) by mouth every 8 (eight) hours as needed for nausea or vomiting. (Patient not taking: Reported on 11/24/2016) 20 tablet 0  . oxyCODONE-acetaminophen (PERCOCET/ROXICET) 5-325 MG tablet Take 1 tablet by mouth every 8 (eight) hours as needed for severe pain.  (Patient not taking: Reported on 11/24/2016) 20 tablet 0  . pravastatin (PRAVACHOL) 40 MG tablet Take 1 tablet (40 mg total) by mouth daily. (Patient not taking: Reported on 11/24/2016) 30 tablet 2   No facility-administered medications prior to visit.     ROS Review of Systems  Constitutional: Negative for activity change, appetite change and fatigue.  HENT: Negative for congestion, sinus pressure and sore throat.   Eyes: Negative for visual disturbance.  Respiratory: Negative for cough, chest tightness, shortness of breath and wheezing.   Cardiovascular: Negative for chest pain and palpitations.  Gastrointestinal: Positive for abdominal pain and diarrhea. Negative for abdominal distention and constipation.  Endocrine: Negative for polydipsia.  Genitourinary: Negative for dysuria and frequency.  Musculoskeletal: Negative for arthralgias and back pain.  Skin: Negative for rash.  Neurological: Negative for tremors, light-headedness and numbness.  Hematological: Does not bruise/bleed easily.  Psychiatric/Behavioral: Negative for agitation and behavioral problems.    Objective:  BP (!) 144/80   Pulse 61   Temp 98.2 F (36.8 C) (Oral)   Wt 156 lb 3.2 oz (70.9 kg)   LMP 07/24/2011   SpO2 96%   BMI 24.46 kg/m   BP/Weight 11/24/2016 10/20/2016 09/10/2016  Systolic BP 144 148 150  Diastolic BP 80 84 80  Wt. (Lbs) 156.2 160 162.8  BMI 24.46 25.06 25.5      Physical Exam  Constitutional: She is oriented to person, place, and time. She appears well-developed and well-nourished.  Cardiovascular: Normal rate, normal heart sounds and intact distal pulses.   No murmur heard. Pulmonary/Chest: Effort normal and breath sounds normal. She has no wheezes. She has no rales. She exhibits no tenderness.  Abdominal: Soft. Bowel sounds are normal. She exhibits no distension and no mass. There is tenderness (left lower quadrant pain).  Musculoskeletal: Normal range of motion.  Neurological: She is  alert and oriented to person, place, and time.  Skin: Skin is warm and dry.  Psychiatric: She has a normal mood and affect.     Lab Results  Component Value Date   HGBA1C 11.3 11/24/2016    Assessment & Plan:   1. Diabetes mellitus without complication (HCC) Uncontrolled with A1c of 11.3 largely due to noncompliance Blood sugar is 443 and the clinic today on 20 units of NovoLog administered, patient observed for 30 minutes prior to recheck. Increase dose of glimepiride Would love to switch to a more potent statin however she states she is unable to tolerate Lipitor due to tongue swelling but can tolerate pravastatin Will review blood sugar log at next visit - POCT glucose (manual entry) - POCT glycosylated hemoglobin (Hb A1C) - glimepiride (AMARYL) 4 MG tablet; Take 2 tablets (8 mg total) by mouth daily with breakfast.  Dispense: 60 tablet; Refill: 5 - pravastatin (PRAVACHOL) 40 MG tablet; Take 1 tablet (40 mg total) by mouth daily.  Dispense: 30 tablet; Refill: 5 - metFORMIN (GLUCOPHAGE) 500 MG tablet; Take 1 tablet (500 mg total) by mouth daily with breakfast.  Dispense: 30 tablet; Refill: 5 - glucose blood (ACCU-CHEK AVIVA) test strip; Use as instructed daily  Dispense: 100 each; Refill: 12 - Blood Glucose Monitoring Suppl (ACCU-CHEK AVIVA) device; Use as instructed daily.  Dispense: 1 each; Refill: 0 - Lancet Devices (ACCU-CHEK SOFTCLIX) lancets; Use as instructed daily.  Dispense: 1 each; Refill: 5  2. Pruritus Stable - hydrOXYzine (ATARAX/VISTARIL) 10 MG tablet; Take 1 tablet (10 mg total) by mouth daily.  Dispense: 30 tablet; Refill: 5  3. Benign paroxysmal positional vertigo due to bilateral vestibular disorder Controlled - meclizine (ANTIVERT) 25 MG tablet; Take 1 tablet (25 mg total) by mouth 3 (three) times daily as needed for dizziness.  Dispense: 60 tablet; Refill: 3  4. Pelvic pain Chronic pelvic and left lower quadrant pain of unknown etiology - US Pelvis  Complete; Future - US Transvaginal Non-OB; Future  5. Chronic systolic CHF (congestive heart failure) (HCC) EF of 55-60% from 2-D echo 09/2016 Euvolemic Keep close follow-up with cardiology  6. Anxiety Controlled - FLUoxetine (PROZAC) 40 MG capsule; Take 1 capsule (40 mg total) by mouth at bedtime.  Dispense: 30 capsule; Refill: 5  7. Other hemorrhoids We'll treat possible IBS and if symptoms persist may need referral to general surgery for surgical management of hemorrhoids  8. Essential hypertension Slightly elevated above goal of less than 130/80 No regimen changes today Low-sodium diet - bisoprolol (ZEBETA) 5 MG tablet; Take 0.5 tablets (2.5 mg total) by mouth daily.  Dispense: 30 tablet; Refill: 5 - valsartan (DIOVAN) 160 MG tablet; Take 1 tablet (160 mg total) by mouth daily with breakfast.  Dispense: 30 tablet; Refill: 5  9. Irritable bowel syndrome with diarrhea - dicyclomine (BENTYL) 10 MG capsule; Take 1 capsule (10 mg total) by mouth 3 (three) times daily before meals.  Dispense: 90 capsule; Refill: 5  10. Simple chronic bronchitis (HCC) Stable - albuterol (PROVENTIL HFA;VENTOLIN HFA) 108 (90 Base) MCG/ACT inhaler; Inhale 2 puffs into the lungs every 6 (six) hours as needed for wheezing or shortness of breath. For wheezing  Dispense: 1 Inhaler; Refill: 3 - tiotropium (SPIRIVA) 18 MCG inhalation capsule; Place 1 capsule (18 mcg total) into inhaler and inhale daily.  Dispense: 30 capsule; Refill: 5  11. History of gastroesophageal reflux (GERD) Stable - esomeprazole (NEXIUM) 20 MG capsule; Take 1 capsule (20 mg total) by mouth daily.  Dispense: 30 capsule; Refill: 5   Meds ordered this encounter  Medications  . esomeprazole (NEXIUM) 20 MG capsule    Sig: Take 1 capsule (20 mg total) by mouth daily.    Dispense:  30 capsule    Refill:  5    Must have office visit for refills  . bisoprolol (ZEBETA) 5 MG tablet    Sig: Take 0.5 tablets (2.5 mg total) by mouth daily.     Dispense:  30 tablet    Refill:  5  . albuterol (PROVENTIL HFA;VENTOLIN HFA) 108 (90 Base) MCG/ACT inhaler    Sig: Inhale 2 puffs into the lungs every 6 (six) hours as needed for wheezing or shortness of breath. For wheezing    Dispense:  1 Inhaler    Refill:  3  . FLUoxetine (PROZAC) 40 MG capsule    Sig: Take 1 capsule (40 mg total) by mouth at bedtime.    Dispense:  30 capsule    Refill:  5  . glimepiride (AMARYL) 4 MG tablet    Sig: Take 2 tablets (8 mg total) by mouth daily with breakfast.    Dispense:  60 tablet    Refill:  5    Must have office visit for refills  . valsartan (DIOVAN) 160 MG tablet    Sig: Take 1 tablet (160 mg total) by mouth daily with breakfast.    Dispense:  30 tablet    Refill:  5  . tiotropium (SPIRIVA) 18 MCG inhalation capsule    Sig: Place 1 capsule (18 mcg total) into inhaler and inhale daily.    Dispense:  30 capsule    Refill:  5  . pravastatin (PRAVACHOL) 40 MG tablet    Sig: Take 1 tablet (40 mg total) by mouth daily.    Dispense:  30 tablet    Refill:  5  . hydrOXYzine (ATARAX/VISTARIL) 10 MG tablet    Sig: Take 1 tablet (10 mg total) by mouth daily.    Dispense:  30 tablet    Refill:  5  . meclizine (ANTIVERT) 25 MG tablet    Sig: Take 1 tablet (25 mg total) by mouth 3 (three) times daily as needed for dizziness.    Dispense:  60 tablet    Refill:  3  . metFORMIN (GLUCOPHAGE) 500 MG tablet    Sig: Take 1 tablet (500 mg total) by mouth daily with breakfast.    Dispense:  30 tablet    Refill:  5  . dicyclomine (BENTYL) 10 MG capsule    Sig: Take 1 capsule (10 mg total) by mouth 3 (three)  times daily before meals.    Dispense:  90 capsule    Refill:  5  . glucose blood (ACCU-CHEK AVIVA) test strip    Sig: Use as instructed daily    Dispense:  100 each    Refill:  12  . Blood Glucose Monitoring Suppl (ACCU-CHEK AVIVA) device    Sig: Use as instructed daily.    Dispense:  1 each    Refill:  0  . Lancet Devices (ACCU-CHEK SOFTCLIX)  lancets    Sig: Use as instructed daily.    Dispense:  1 each    Refill:  5    Follow-up: Return in about 1 month (around 12/25/2016) for Complete physical exam.    This note has been created with Education officer, environmental. Any transcriptional errors are unintentional.     Jaclyn Shaggy MD

## 2016-11-25 ENCOUNTER — Encounter (INDEPENDENT_AMBULATORY_CARE_PROVIDER_SITE_OTHER): Payer: Self-pay

## 2016-11-25 ENCOUNTER — Ambulatory Visit (INDEPENDENT_AMBULATORY_CARE_PROVIDER_SITE_OTHER): Payer: Medicaid Other | Admitting: Physical Medicine and Rehabilitation

## 2016-11-29 NOTE — Pre-Procedure Instructions (Signed)
VERNIE VINCIGUERRA  11/29/2016      [facility]- [place] - [place], [place] - [address] [place] [address] Phone: [phone] Fax: [phone]    Your procedure is scheduled on June 8   Report to [facility] Admitting at 0930 A.M.  Call this number if you have problems the morning of surgery:  [phone]   Remember:  Do not eat food or drink liquids after midnight.   Take these medicines the morning of surgery with A SIP OF WATER acetaminophen (TYLENOL) if needed, albuterol (PROVENTIL HFA;VENTOLIN HFA) bring all inhalers with you the morning of surgery, bisoprolol (ZEBETA),  dicyclomine (BENTYL), esomeprazole (NEXIUM),  meclizine (ANTIVERT) , tiotropium (SPIRIVA)  7 days prior to surgery STOP taking any Aspirin, Aleve, Naproxen, Ibuprofen, Motrin, Advil, Goody's, BC's, all herbal medications, fish oil, and all vitamins  WHAT DO I DO ABOUT MY DIABETES MEDICATION?   Marland Kitchen Do not take oral diabetes medicines (pills) the morning of surgery. metFORMIN (GLUCOPHAGE), glimepiride (AMARYL)   How to Manage Your Diabetes Before and After Surgery  Why is it important to control my blood sugar before and after surgery? . Improving blood sugar levels before and after surgery helps healing and can limit problems. . A way of improving blood sugar control is eating a healthy diet by: o  Eating less sugar and carbohydrates o  Increasing activity/exercise o  Talking with your doctor about reaching your blood sugar goals . High blood sugars (greater than 180 mg/dL) can raise your risk of infections and slow your recovery, so you will need to focus on controlling your diabetes during the weeks before surgery. . Make sure that the doctor who takes care of your diabetes knows about your planned surgery including the date and location.  How do I manage my blood sugar before surgery? . Check your blood sugar at least 4 times a day, starting 2  days before surgery, to make sure that the level is not too high or low. o Check your blood sugar the morning of your surgery when you wake up and every 2 hours until you get to the Short Stay unit. . If your blood sugar is less than 70 mg/dL, you will need to treat for low blood sugar: o Do not take insulin. o Treat a low blood sugar (less than 70 mg/dL) with  cup of clear juice (cranberry or apple), 4 glucose tablets, OR glucose gel. o Recheck blood sugar in 15 minutes after treatment (to make sure it is greater than 70 mg/dL). If your blood sugar is not greater than 70 mg/dL on recheck, call [phone] for further instructions. . Report your blood sugar to the short stay nurse when you get to Short Stay.  . If you are admitted to the hospital after surgery: o Your blood sugar will be checked by the staff and you will probably be given insulin after surgery (instead of oral diabetes medicines) to make sure you have good blood sugar levels. o The goal for blood sugar control after surgery is 80-180 mg/dL.    Do not wear jewelry, make-up or nail polish.  Do not wear lotions, powders, or perfumes, or deoderant.  Do not shave 48 hours prior to surgery.    Do not bring valuables to the hospital.  [facility] is not responsible for any belongings or valuables.  Contacts, dentures or bridgework may not be worn into surgery.  Leave your  suitcase in the car.  After surgery it may be brought to your room.  For patients admitted to the hospital, discharge time will be determined by your treatment team.  Patients discharged the day of surgery will not be allowed to drive home.   * Special instructions:   [facility]- Preparing For Surgery  Before surgery, you can play an important role. Because skin is not sterile, your skin needs to be as free of germs as possible. You can reduce the number of germs on your skin by washing with CHG (chlorahexidine gluconate) Soap before surgery.  CHG is an  antiseptic cleaner which kills germs and bonds with the skin to continue killing germs even after washing.  Please do not use if you have an allergy to CHG or antibacterial soaps. If your skin becomes reddened/irritated stop using the CHG.  Do not shave (including legs and underarms) for at least 48 hours prior to first CHG shower. It is OK to shave your face.  Please follow these instructions carefully.   1. Shower the NIGHT BEFORE SURGERY and the MORNING OF SURGERY with CHG.   2. If you chose to wash your hair, wash your hair first as usual with your normal shampoo.  3. After you shampoo, rinse your hair and body thoroughly to remove the shampoo.  4. Use CHG as you would any other liquid soap. You can apply CHG directly to the skin and wash gently with a scrungie or a clean washcloth.   5. Apply the CHG Soap to your body ONLY FROM THE NECK DOWN.  Do not use on open wounds or open sores. Avoid contact with your eyes, ears, mouth and genitals (private parts). Wash genitals (private parts) with your normal soap.  6. Wash thoroughly, paying special attention to the area where your surgery will be performed.  7. Thoroughly rinse your body with warm water from the neck down.  8. DO NOT shower/wash with your normal soap after using and rinsing off the CHG Soap.  9. Pat yourself dry with a CLEAN TOWEL.   10. Wear CLEAN PAJAMAS   11. Place CLEAN SHEETS on your bed the night of your first shower and DO NOT SLEEP WITH PETS.    Day of Surgery: Do not apply any deodorants/lotions. Please wear clean clothes to the hospital/surgery center.      Please read over the following fact sheets that you were given.

## 2016-11-30 ENCOUNTER — Ambulatory Visit (HOSPITAL_COMMUNITY)
Admission: RE | Admit: 2016-11-30 | Discharge: 2016-11-30 | Disposition: A | Discharge status: Home / Self Care | Payer: Medicaid Other | Source: Ambulatory Visit | Attending: Family Medicine | Admitting: Family Medicine

## 2016-11-30 ENCOUNTER — Encounter (HOSPITAL_COMMUNITY)
Admission: RE | Admit: 2016-11-30 | Discharge: 2016-11-30 | Disposition: A | Discharge status: Home / Self Care | Payer: Medicaid Other | Source: Ambulatory Visit | Attending: Oral Surgery | Admitting: Oral Surgery

## 2016-11-30 ENCOUNTER — Encounter (HOSPITAL_COMMUNITY): Payer: Self-pay

## 2016-11-30 DIAGNOSIS — R102 Pelvic and perineal pain: Secondary | ICD-10-CM | POA: Insufficient documentation

## 2016-11-30 DIAGNOSIS — E119 Type 2 diabetes mellitus without complications: Secondary | ICD-10-CM | POA: Diagnosis not present

## 2016-11-30 DIAGNOSIS — Z01818 Encounter for other preprocedural examination: Secondary | ICD-10-CM | POA: Diagnosis not present

## 2016-11-30 LAB — BASIC METABOLIC PANEL
ANION GAP: 11 (ref 5–15)
BUN: 5 mg/dL — ABNORMAL LOW (ref 6–20)
CO2: 23 mmol/L (ref 22–32)
Calcium: 9.1 mg/dL (ref 8.9–10.3)
Chloride: 102 mmol/L (ref 101–111)
Creatinine, Ser: 0.61 mg/dL (ref 0.44–1.00)
GFR calc Af Amer: >60 mL/min (ref >60)
GLUCOSE: 285 mg/dL — AB (ref 65–99)
POTASSIUM: 3.1 mmol/L — AB (ref 3.5–5.1)
Sodium: 136 mmol/L (ref 135–145)

## 2016-11-30 LAB — CBC
HEMATOCRIT: 45.4 % (ref 36.0–46.0)
HEMOGLOBIN: 15.2 g/dL — AB (ref 12.0–15.0)
MCH: 28.8 pg (ref 26.0–34.0)
MCHC: 33.5 g/dL (ref 30.0–36.0)
MCV: 86 fL (ref 78.0–100.0)
Platelets: 121 10*3/uL — ABNORMAL LOW (ref 150–400)
RBC: 5.28 MIL/uL — ABNORMAL HIGH (ref 3.87–5.11)
RDW: 12.7 % (ref 11.5–15.5)
WBC: 7.6 10*3/uL (ref 4.0–10.5)

## 2016-11-30 LAB — GLUCOSE, CAPILLARY: Glucose-Capillary: 283 mg/dL — ABNORMAL HIGH (ref 65–99)

## 2016-11-30 NOTE — Progress Notes (Signed)
PCP - Jaclyn Shaggy [facility] Cardiologist - [place]  Chest x-ray - 05/23/16 EKG - 05/24/16 Stress Test - 06/19/15 ECHO - 09/29/16 Cardiac Cath -06/24/15     Fasting Blood Sugar - 150s Checks Blood Sugar ___3__ times a day   Sending to anesthesia for review of cardiac history  Patient denies shortness of breath, fever, cough and chest pain at PAT appointment   Patient verbalized understanding of instructions that were given to them at the PAT appointment. Patient was also instructed that they will need to review over the PAT instructions again at home before surgery.

## 2016-12-01 ENCOUNTER — Telehealth: Payer: Self-pay | Admitting: Family Medicine

## 2016-12-01 LAB — HEMOGLOBIN A1C
Hgb A1c MFr Bld: 11.3 % — ABNORMAL HIGH (ref 4.8–5.6)
Mean Plasma Glucose: 278 mg/dL

## 2016-12-01 NOTE — Telephone Encounter (Signed)
Pt. Returned nurse call. Please f/u with pt.  °

## 2016-12-01 NOTE — Telephone Encounter (Signed)
Pt name and DOB verified, Informed of lab results. Verified understanding.

## 2016-12-01 NOTE — Progress Notes (Addendum)
Anesthesia Chart Review:  Pt is a 56 year old female scheduled for dental restoration, extractions and 12/03/2016 with Ocie Doyne, DDS.  - PCP is Jaclyn Shaggy, MD, last office visit 11/24/16 - Cardiologist is Kristeen Miss, MD, last office visit 09/10/16  PMH includes: Chronic systolic heart failure, atrial fibrillation (? Atrial flutter?; pt not sure; happened 2010), HTN, DM, COPD, GERD. Former smoker. BMI 24.5. S/p R ankle hardware removal 07/15/15. S/p cholecystectomy 11/24/11.  Medications include: Albuterol, ASA 81 mg, bisoprolol, glimepiride, metformin, pravastatin, Spiriva, valsartan  Preoperative labs 11/30/16 reviewed. - HbA1c 11.3, glucose 285. (Note from PCP 11/24/16 indicates poorly controlled DM due to noncompliance; medications increased at that visit) - I notified Carrie in Dr. Randa Evens office of uncontrolled DM.   CXR 05/23/16: Negative 2 view chest x-ray  EKG 05/24/16: Sinus bradycardia (54 bpm). T wave abnormality, consider inferolateral ischemia. Appears stable on EKGs dating back to 10/24/15  Echo 09/29/16:  - Left ventricle: The cavity size was normal. Wall thickness was increased in a pattern of moderate LVH. Systolic function was normal. The estimated ejection fraction was in the range of 55% to 60%. Wall motion was normal; there were no regional wall motion abnormalities. Doppler parameters are consistent with abnormal left ventricular relaxation (grade 1 diastolic dysfunction). The E&'/e&' ratio is between 8-15, suggesting indeterminate LV filling pressure. - Aortic valve: Trileaflet. Sclerosis without stenosis. There was trivial regurgitation. - Mitral valve: Mildly thickened leaflets . There was trivial regurgitation. - Left atrium: The atrium was normal in size. - Tricuspid valve: There was trivial regurgitation. - Pulmonary arteries: PA peak pressure: 13 mm Hg (S). - Inferior vena cava: The vessel was normal in size. The respirophasic diameter changes were in the normal  range (>= 50%), consistent with normal central venous pressure. - Impressions: Compared to a prior study in 2016, the LVEF has improved from 25-30% up to 55-60%.  Cardiac cath 06/24/15:    Widely patent coronary arteries. Right dominant coronary circulation anatomy.  Severe global left ventricular dysfunction with EF less than 20%. Upper normal left ventricular filling pressures with EDP less than 18 mmHg.  If glucose acceptable DOS, I anticipate pt can proceed with surgery as scheduled.   Rica Mast, FNP-BC [facility] Short Stay Surgical Center/Anesthesiology Phone: [phone] 12/01/2016 1:17 PM

## 2016-12-01 NOTE — Progress Notes (Signed)
Entry error

## 2016-12-02 NOTE — H&P (Signed)
HISTORY AND PHYSICAL  [name] is a 56 y.o. female patient with CC: painful teeth. Referred by general dentist for dental extractions.  No diagnosis found.  Past Medical History:  Diagnosis Date  . Anxiety   . Arthritis   . Chronic systolic heart failure (HCC)    EF 20-25% ECHO 05/2015  . COPD (chronic obstructive pulmonary disease) (HCC)   . Depression   . Diabetes mellitus without complication (HCC)    Type II  . Dysrhythmia    pt unsure of name of arrythmia - " my heart rate will drop all of a sudden" -no current treatment - atrial flutter  . Fibrillation, atrial (HCC)   . Gallstones   . GERD (gastroesophageal reflux disease)   . Headache(784.0)    migraines  . Hypertension   . Osteomyelitis (HCC)    R ankle  . Rash    arms    No current facility-administered medications for this encounter.    Current Outpatient Prescriptions  Medication Sig Dispense Refill  . acetaminophen (TYLENOL) 500 MG tablet Take 1,000 mg by mouth every 4 (four) hours as needed (pain).     Marland Kitchen albuterol (PROVENTIL HFA;VENTOLIN HFA) 108 (90 Base) MCG/ACT inhaler Inhale 2 puffs into the lungs every 6 (six) hours as needed for wheezing or shortness of breath. For wheezing 1 Inhaler 3  . bisoprolol (ZEBETA) 5 MG tablet Take 0.5 tablets (2.5 mg total) by mouth daily. 30 tablet 5  . Blood Glucose Monitoring Suppl (ACCU-CHEK AVIVA) device Use as instructed daily. (Patient taking differently: 1 each by Other route 3 (three) times daily. ) 1 each 0  . FLUoxetine (PROZAC) 40 MG capsule Take 1 capsule (40 mg total) by mouth at bedtime. 30 capsule 5  . glimepiride (AMARYL) 4 MG tablet Take 2 tablets (8 mg total) by mouth daily with breakfast. 60 tablet 5  . glucose blood (ACCU-CHEK AVIVA) test strip Use as instructed daily (Patient taking differently: 1 each by Other route 3 (three) times daily. ) 100 each 12  . hydrOXYzine (ATARAX/VISTARIL) 10 MG tablet Take 1 tablet (10 mg total) by mouth daily. (Patient  taking differently: Take 10-20 mg by mouth at bedtime. ) 30 tablet 5  . Lancet Devices (ACCU-CHEK SOFTCLIX) lancets Use as instructed daily. (Patient taking differently: 1 each by Other route 3 (three) times daily. ) 1 each 5  . loperamide (IMODIUM) 2 MG capsule Take 4 mg by mouth 2 (two) times daily.    . meclizine (ANTIVERT) 25 MG tablet Take 1 tablet (25 mg total) by mouth 3 (three) times daily as needed for dizziness. 60 tablet 3  . metFORMIN (GLUCOPHAGE) 500 MG tablet Take 1 tablet (500 mg total) by mouth daily with breakfast. 30 tablet 5  . pravastatin (PRAVACHOL) 40 MG tablet Take 1 tablet (40 mg total) by mouth daily. 30 tablet 5  . tiotropium (SPIRIVA) 18 MCG inhalation capsule Place 1 capsule (18 mcg total) into inhaler and inhale daily. 30 capsule 5  . valsartan (DIOVAN) 160 MG tablet Take 1 tablet (160 mg total) by mouth daily with breakfast. 30 tablet 5  . aspirin 81 MG EC tablet Take 1 tablet (81 mg total) by mouth daily. 30 tablet 2  . dicyclomine (BENTYL) 10 MG capsule Take 1 capsule (10 mg total) by mouth 3 (three) times daily before meals. 90 capsule 5  . esomeprazole (NEXIUM) 20 MG capsule Take 1 capsule (20 mg total) by mouth daily. 30 capsule 5   Allergies  Allergen Reactions  . Biaxin [Clarithromycin] Anaphylaxis and Swelling  . Clarithromycin Shortness Of Breath and Swelling  . Lisinopril Swelling and Cough    Lip edema  . Sulfa Antibiotics Anaphylaxis, Shortness Of Breath, Swelling and Hypertension  . Chlorthalidone Nausea And Vomiting and Other (See Comments)    Clammy, Tachycardia, Headache   . Daptomycin Nausea And Vomiting  . Amoxicillin-Pot Clavulanate Diarrhea    Has patient had a PCN reaction causing immediate rash, facial/tongue/throat swelling, SOB or lightheadedness with hypotension:No Has patient had a PCN reaction causing severe rash involving mucus membranes or skin necrosis:No Has patient had a PCN reaction that required hospitalization:No Has patient  had a PCN reaction occurring within the last 10 years:Yes If all of the above answers are "NO", then may proceed with Cephalosporin use.   . Aspirin Nausea And Vomiting and Rash    On 325mg  dosage  . Cholestyramine Nausea And Vomiting  . Dilaudid [Hydromorphone Hcl] Nausea And Vomiting  . Lasix [Furosemide] Nausea And Vomiting and Other (See Comments)    Headache   . Latex Hives and Rash   Active Problems:   * No active hospital problems. *  Vitals: Last menstrual period 07/24/2011. Lab results:No results found for this or any previous visit (from the past 24 hour(s)). Radiology Results: US Transvaginal Non-ob  Result Date: 11/30/2016 CLINICAL DATA:  LEFT lower quadrant pelvic pain, history hypertension, diabetes mellitus EXAM: TRANSABDOMINAL AND TRANSVAGINAL ULTRASOUND OF PELVIS TECHNIQUE: Both transabdominal and transvaginal ultrasound examinations of the pelvis were performed. Transabdominal technique was performed for global imaging of the pelvis including uterus, ovaries, adnexal regions, and pelvic cul-de-sac. It was necessary to proceed with endovaginal exam following the transabdominal exam to visualize the CT abdomen pelvis 10/31/2016. COMPARISON:  CT abdomen pelvis 11/01/2015 FINDINGS: Uterus Measurements: 6.4 x 3.6 x 4.6 cm. Retroverted. Otherwise normal morphology without mass Endometrium Thickness: 3 mm thick, normal. No endometrial fluid or focal abnormality Right ovary Measurements: 2.3 x 1.4 x 1.8 cm. Normal morphology without mass Left ovary Measurements: 1.4 x 1.3 x 1.5 cm. Normal morphology without mass Other findings No free pelvic fluid or adnexal masses. IMPRESSION: Normal exam. Electronically Signed   By: Ulyses Southward M.D.   On: 11/30/2016 16:37   US Pelvis Complete  Result Date: 11/30/2016 CLINICAL DATA:  LEFT lower quadrant pelvic pain, history hypertension, diabetes mellitus EXAM: TRANSABDOMINAL AND TRANSVAGINAL ULTRASOUND OF PELVIS TECHNIQUE: Both transabdominal and  transvaginal ultrasound examinations of the pelvis were performed. Transabdominal technique was performed for global imaging of the pelvis including uterus, ovaries, adnexal regions, and pelvic cul-de-sac. It was necessary to proceed with endovaginal exam following the transabdominal exam to visualize the CT abdomen pelvis 10/31/2016. COMPARISON:  CT abdomen pelvis 11/01/2015 FINDINGS: Uterus Measurements: 6.4 x 3.6 x 4.6 cm. Retroverted. Otherwise normal morphology without mass Endometrium Thickness: 3 mm thick, normal. No endometrial fluid or focal abnormality Right ovary Measurements: 2.3 x 1.4 x 1.8 cm. Normal morphology without mass Left ovary Measurements: 1.4 x 1.3 x 1.5 cm. Normal morphology without mass Other findings No free pelvic fluid or adnexal masses. IMPRESSION: Normal exam. Electronically Signed   By: Ulyses Southward M.D.   On: 11/30/2016 16:37   General appearance: alert, cooperative and no distress Head: Normocephalic, without obvious abnormality, atraumatic Eyes: negative Nose: Nares normal. Septum midline. Mucosa normal. No drainage or sinus tenderness. Throat: dental caries # 6, 12, 21, 23, 24, 25, 26. Pharynx clear Neck: no adenopathy, supple, symmetrical, trachea midline and thyroid not enlarged, symmetric,  no tenderness/mass/nodules Resp: clear to auscultation bilaterally Cardio: regular rate and rhythm, S1, S2 normal, no murmur, click, rub or gallop  Assessment: nonrestorable teeth secondary to dental caries.  Plan: Dental extractions with alveoloplasty. GA. Day surgery.   Tzippy Testerman M 12/02/2016

## 2016-12-03 ENCOUNTER — Encounter (HOSPITAL_COMMUNITY)
Admission: RE | Disposition: A | Discharge status: Home / Self Care | Payer: Self-pay | Source: Ambulatory Visit | Attending: Oral Surgery

## 2016-12-03 ENCOUNTER — Ambulatory Visit (HOSPITAL_COMMUNITY): Payer: Medicaid Other | Admitting: Emergency Medicine

## 2016-12-03 ENCOUNTER — Encounter (HOSPITAL_COMMUNITY): Payer: Self-pay | Admitting: *Deleted

## 2016-12-03 ENCOUNTER — Ambulatory Visit (HOSPITAL_COMMUNITY): Payer: Medicaid Other | Admitting: Anesthesiology

## 2016-12-03 ENCOUNTER — Ambulatory Visit (HOSPITAL_COMMUNITY)
Admission: RE | Admit: 2016-12-03 | Discharge: 2016-12-03 | Disposition: A | Discharge status: Home / Self Care | Payer: Medicaid Other | Source: Ambulatory Visit | Attending: Oral Surgery | Admitting: Oral Surgery

## 2016-12-03 DIAGNOSIS — Z881 Allergy status to other antibiotic agents status: Secondary | ICD-10-CM | POA: Diagnosis not present

## 2016-12-03 DIAGNOSIS — Z87891 Personal history of nicotine dependence: Secondary | ICD-10-CM | POA: Diagnosis not present

## 2016-12-03 DIAGNOSIS — F419 Anxiety disorder, unspecified: Secondary | ICD-10-CM | POA: Insufficient documentation

## 2016-12-03 DIAGNOSIS — Z882 Allergy status to sulfonamides status: Secondary | ICD-10-CM | POA: Insufficient documentation

## 2016-12-03 DIAGNOSIS — K219 Gastro-esophageal reflux disease without esophagitis: Secondary | ICD-10-CM | POA: Diagnosis not present

## 2016-12-03 DIAGNOSIS — D649 Anemia, unspecified: Secondary | ICD-10-CM | POA: Insufficient documentation

## 2016-12-03 DIAGNOSIS — K0530 Chronic periodontitis, unspecified: Secondary | ICD-10-CM | POA: Diagnosis not present

## 2016-12-03 DIAGNOSIS — I4891 Unspecified atrial fibrillation: Secondary | ICD-10-CM | POA: Diagnosis not present

## 2016-12-03 DIAGNOSIS — I499 Cardiac arrhythmia, unspecified: Secondary | ICD-10-CM | POA: Insufficient documentation

## 2016-12-03 DIAGNOSIS — R51 Headache: Secondary | ICD-10-CM | POA: Diagnosis not present

## 2016-12-03 DIAGNOSIS — Z79899 Other long term (current) drug therapy: Secondary | ICD-10-CM | POA: Diagnosis not present

## 2016-12-03 DIAGNOSIS — I110 Hypertensive heart disease with heart failure: Secondary | ICD-10-CM | POA: Insufficient documentation

## 2016-12-03 DIAGNOSIS — J449 Chronic obstructive pulmonary disease, unspecified: Secondary | ICD-10-CM | POA: Insufficient documentation

## 2016-12-03 DIAGNOSIS — Z886 Allergy status to analgesic agent status: Secondary | ICD-10-CM | POA: Diagnosis not present

## 2016-12-03 DIAGNOSIS — K029 Dental caries, unspecified: Secondary | ICD-10-CM | POA: Diagnosis not present

## 2016-12-03 DIAGNOSIS — F329 Major depressive disorder, single episode, unspecified: Secondary | ICD-10-CM | POA: Insufficient documentation

## 2016-12-03 DIAGNOSIS — Z91040 Latex allergy status: Secondary | ICD-10-CM | POA: Insufficient documentation

## 2016-12-03 DIAGNOSIS — M1990 Unspecified osteoarthritis, unspecified site: Secondary | ICD-10-CM | POA: Diagnosis not present

## 2016-12-03 DIAGNOSIS — Z888 Allergy status to other drugs, medicaments and biological substances status: Secondary | ICD-10-CM | POA: Insufficient documentation

## 2016-12-03 DIAGNOSIS — I5022 Chronic systolic (congestive) heart failure: Secondary | ICD-10-CM | POA: Insufficient documentation

## 2016-12-03 DIAGNOSIS — E119 Type 2 diabetes mellitus without complications: Secondary | ICD-10-CM | POA: Insufficient documentation

## 2016-12-03 HISTORY — PX: TOOTH EXTRACTION: SHX859

## 2016-12-03 LAB — GLUCOSE, CAPILLARY
GLUCOSE-CAPILLARY: 217 mg/dL — AB (ref 65–99)
Glucose-Capillary: 210 mg/dL — ABNORMAL HIGH (ref 65–99)

## 2016-12-03 SURGERY — DENTAL RESTORATION/EXTRACTIONS
Anesthesia: General | Site: Mouth

## 2016-12-03 MED ORDER — DEXAMETHASONE SODIUM PHOSPHATE 10 MG/ML IJ SOLN
INTRAMUSCULAR | Status: DC | PRN
  Administered 2016-12-03: 8 mg via INTRAVENOUS

## 2016-12-03 MED ORDER — SODIUM CHLORIDE 0.9 % IV SOLN
INTRAVENOUS | Status: DC
  Administered 2016-12-03: 35 mL/h via INTRAVENOUS

## 2016-12-03 MED ORDER — LIDOCAINE-EPINEPHRINE 2 %-1:100000 IJ SOLN
INTRAMUSCULAR | Status: AC
  Filled 2016-12-03: qty 1

## 2016-12-03 MED ORDER — SUCCINYLCHOLINE CHLORIDE 200 MG/10ML IV SOSY
PREFILLED_SYRINGE | INTRAVENOUS | Status: AC
  Filled 2016-12-03: qty 10

## 2016-12-03 MED ORDER — SUCCINYLCHOLINE CHLORIDE 200 MG/10ML IV SOSY
PREFILLED_SYRINGE | INTRAVENOUS | Status: DC | PRN
  Administered 2016-12-03: 100 mg via INTRAVENOUS

## 2016-12-03 MED ORDER — EPHEDRINE SULFATE-NACL 50-0.9 MG/10ML-% IV SOSY
PREFILLED_SYRINGE | INTRAVENOUS | Status: DC | PRN
  Administered 2016-12-03 (×2): 10 mg via INTRAVENOUS

## 2016-12-03 MED ORDER — ALBUTEROL SULFATE (2.5 MG/3ML) 0.083% IN NEBU
INHALATION_SOLUTION | RESPIRATORY_TRACT | Status: AC
  Filled 2016-12-03: qty 3

## 2016-12-03 MED ORDER — CEPHALEXIN 500 MG PO CAPS: 500.0000 mg | ORAL_CAPSULE | Freq: Four times a day (QID) | ORAL | 0 refills | Status: DC

## 2016-12-03 MED ORDER — SODIUM CHLORIDE 0.9 % IR SOLN
Status: DC | PRN
  Administered 2016-12-03: 1000 mL

## 2016-12-03 MED ORDER — OXYCODONE-ACETAMINOPHEN 5-325 MG PO TABS
ORAL_TABLET | ORAL | Status: AC
  Filled 2016-12-03: qty 1

## 2016-12-03 MED ORDER — ONDANSETRON HCL 4 MG/2ML IJ SOLN
INTRAMUSCULAR | Status: DC | PRN
  Administered 2016-12-03: 4 mg via INTRAVENOUS

## 2016-12-03 MED ORDER — OXYCODONE-ACETAMINOPHEN 5-325 MG PO TABS
1.0000 | ORAL_TABLET | Freq: Once | ORAL | Status: AC
  Administered 2016-12-03: 1 via ORAL

## 2016-12-03 MED ORDER — FENTANYL CITRATE (PF) 100 MCG/2ML IJ SOLN
INTRAMUSCULAR | Status: AC
  Administered 2016-12-03: 25 ug via INTRAVENOUS
  Filled 2016-12-03: qty 2

## 2016-12-03 MED ORDER — CEFAZOLIN SODIUM-DEXTROSE 2-4 GM/100ML-% IV SOLN
INTRAVENOUS | Status: AC
  Filled 2016-12-03: qty 100

## 2016-12-03 MED ORDER — OXYCODONE-ACETAMINOPHEN 5-325 MG PO TABS: 1.0000 | ORAL_TABLET | ORAL | 0 refills | Status: DC | PRN

## 2016-12-03 MED ORDER — FENTANYL CITRATE (PF) 250 MCG/5ML IJ SOLN
INTRAMUSCULAR | Status: AC
  Filled 2016-12-03: qty 5

## 2016-12-03 MED ORDER — 0.9 % SODIUM CHLORIDE (POUR BTL) OPTIME
TOPICAL | Status: DC | PRN
  Administered 2016-12-03: 1000 mL

## 2016-12-03 MED ORDER — LACTATED RINGERS IV SOLN
INTRAVENOUS | Status: DC | PRN
  Administered 2016-12-03: 12:00:00 via INTRAVENOUS

## 2016-12-03 MED ORDER — ALBUTEROL SULFATE (2.5 MG/3ML) 0.083% IN NEBU
2.5000 mg | INHALATION_SOLUTION | Freq: Four times a day (QID) | RESPIRATORY_TRACT | Status: DC | PRN
  Administered 2016-12-03: 2.5 mg via RESPIRATORY_TRACT

## 2016-12-03 MED ORDER — MIDAZOLAM HCL 2 MG/2ML IJ SOLN
INTRAMUSCULAR | Status: AC
  Filled 2016-12-03: qty 2

## 2016-12-03 MED ORDER — PROPOFOL 10 MG/ML IV BOLUS
INTRAVENOUS | Status: DC | PRN
  Administered 2016-12-03: 50 mg via INTRAVENOUS
  Administered 2016-12-03: 110 mg via INTRAVENOUS

## 2016-12-03 MED ORDER — PROPOFOL 10 MG/ML IV BOLUS
INTRAVENOUS | Status: AC
  Filled 2016-12-03: qty 20

## 2016-12-03 MED ORDER — CEFAZOLIN SODIUM-DEXTROSE 2-3 GM-% IV SOLR
INTRAVENOUS | Status: DC | PRN
  Administered 2016-12-03: 2 g via INTRAVENOUS

## 2016-12-03 MED ORDER — ONDANSETRON HCL 4 MG/2ML IJ SOLN
INTRAMUSCULAR | Status: AC
  Filled 2016-12-03: qty 2

## 2016-12-03 MED ORDER — PHENYLEPHRINE 40 MCG/ML (10ML) SYRINGE FOR IV PUSH (FOR BLOOD PRESSURE SUPPORT)
PREFILLED_SYRINGE | INTRAVENOUS | Status: DC | PRN
  Administered 2016-12-03: 80 ug via INTRAVENOUS

## 2016-12-03 MED ORDER — ONDANSETRON HCL 4 MG/2ML IJ SOLN: INTRAMUSCULAR | Status: DC | PRN

## 2016-12-03 MED ORDER — OXYMETAZOLINE HCL 0.05 % NA SOLN
NASAL | Status: AC
  Filled 2016-12-03: qty 15

## 2016-12-03 MED ORDER — SODIUM CHLORIDE 0.9 % IN NEBU
INHALATION_SOLUTION | RESPIRATORY_TRACT | Status: AC
  Filled 2016-12-03: qty 3

## 2016-12-03 MED ORDER — FENTANYL CITRATE (PF) 100 MCG/2ML IJ SOLN
25.0000 ug | INTRAMUSCULAR | Status: DC | PRN
  Administered 2016-12-03: 50 ug via INTRAVENOUS
  Administered 2016-12-03 (×2): 25 ug via INTRAVENOUS

## 2016-12-03 MED ORDER — LIDOCAINE-EPINEPHRINE 2 %-1:100000 IJ SOLN
INTRAMUSCULAR | Status: DC | PRN
  Administered 2016-12-03: 10 mL

## 2016-12-03 MED ORDER — DEXAMETHASONE SODIUM PHOSPHATE 10 MG/ML IJ SOLN
INTRAMUSCULAR | Status: AC
  Filled 2016-12-03: qty 1

## 2016-12-03 MED ORDER — OXYMETAZOLINE HCL 0.05 % NA SOLN
NASAL | Status: DC | PRN
  Administered 2016-12-03 (×2): 2 via NASAL

## 2016-12-03 MED ORDER — FENTANYL CITRATE (PF) 100 MCG/2ML IJ SOLN
INTRAMUSCULAR | Status: DC | PRN
  Administered 2016-12-03: 50 ug via INTRAVENOUS
  Administered 2016-12-03: 100 ug via INTRAVENOUS

## 2016-12-03 MED ORDER — LIDOCAINE 2% (20 MG/ML) 5 ML SYRINGE
INTRAMUSCULAR | Status: DC | PRN
  Administered 2016-12-03: 60 mg via INTRAVENOUS

## 2016-12-03 MED ORDER — SODIUM CHLORIDE 0.9 % IN NEBU
INHALATION_SOLUTION | RESPIRATORY_TRACT | Status: AC
  Administered 2016-12-03: 3 mL
  Filled 2016-12-03: qty 3

## 2016-12-03 MED ORDER — MIDAZOLAM HCL 5 MG/5ML IJ SOLN
INTRAMUSCULAR | Status: DC | PRN
  Administered 2016-12-03: 2 mg via INTRAVENOUS

## 2016-12-03 MED ORDER — LACTATED RINGERS IV SOLN: INTRAVENOUS | Status: DC | PRN

## 2016-12-03 SURGICAL SUPPLY — 26 items
BUR CROSS CUT FISSURE 1.6 (BURR) ×1 IMPLANT
BUR CROSS CUT FISSURE 1.6MM (BURR) ×1
BUR EGG ELITE 4.0 (BURR) ×2 IMPLANT
BUR EGG ELITE 4.0MM (BURR) ×1
CANISTER SUCT 3000ML PPV (MISCELLANEOUS) ×3 IMPLANT
COVER SURGICAL LIGHT HANDLE (MISCELLANEOUS) ×3 IMPLANT
GAUZE PACKING FOLDED 2  STR (GAUZE/BANDAGES/DRESSINGS) ×2
GAUZE PACKING FOLDED 2 STR (GAUZE/BANDAGES/DRESSINGS) ×1 IMPLANT
GLOVE BIO SURGEON STRL SZ7 (GLOVE) IMPLANT
GLOVE BIOGEL PI IND STRL 6.5 (GLOVE) IMPLANT
GLOVE BIOGEL PI IND STRL 7.0 (GLOVE) ×1 IMPLANT
GLOVE BIOGEL PI INDICATOR 6.5 (GLOVE)
GLOVE BIOGEL PI INDICATOR 7.0 (GLOVE) ×2
GOWN STRL REUS W/ TWL LRG LVL3 (GOWN DISPOSABLE) ×1 IMPLANT
GOWN STRL REUS W/ TWL XL LVL3 (GOWN DISPOSABLE) ×1 IMPLANT
GOWN STRL REUS W/TWL LRG LVL3 (GOWN DISPOSABLE) ×3
GOWN STRL REUS W/TWL XL LVL3 (GOWN DISPOSABLE) ×3
KIT BASIN OR (CUSTOM PROCEDURE TRAY) ×3 IMPLANT
KIT ROOM TURNOVER OR (KITS) ×3 IMPLANT
NEEDLE 22X1 1/2 (OR ONLY) (NEEDLE) ×6 IMPLANT
NS IRRIG 1000ML POUR BTL (IV SOLUTION) ×3 IMPLANT
PAD ARMBOARD 7.5X6 YLW CONV (MISCELLANEOUS) ×3 IMPLANT
SUT CHROMIC 3 0 PS 2 (SUTURE) ×4 IMPLANT
TRAY ENT MC OR (CUSTOM PROCEDURE TRAY) ×3 IMPLANT
TUBING IRRIGATION (MISCELLANEOUS) ×3 IMPLANT
YANKAUER SUCT BULB TIP NO VENT (SUCTIONS) ×3 IMPLANT

## 2016-12-03 NOTE — Anesthesia Preprocedure Evaluation (Addendum)
Anesthesia Evaluation  Patient identified by MRN, date of birth, ID band Patient awake    Reviewed: Allergy & Precautions, H&P , NPO status , Patient's Chart, lab work & pertinent test results, reviewed documented beta blocker date and time   Airway Mallampati: II  TM Distance: >3 FB Neck ROM: Full    Dental no notable dental hx. (+) Partial Lower, Partial Upper, Dental Advisory Given   Pulmonary COPD,  COPD inhaler, former smoker,    Pulmonary exam normal breath sounds clear to auscultation       Cardiovascular hypertension, Pt. on medications and Pt. on home beta blockers +CHF  + dysrhythmias Atrial Fibrillation  Rhythm:Regular Rate:Normal     Neuro/Psych  Headaches, Anxiety Depression negative psych ROS   GI/Hepatic Neg liver ROS, GERD  Medicated and Controlled,  Endo/Other  diabetes, Type 2, Oral Hypoglycemic Agents  Renal/GU negative Renal ROS  negative genitourinary   Musculoskeletal  (+) Arthritis , Osteoarthritis,    Abdominal   Peds  Hematology negative hematology ROS (+) anemia ,   Anesthesia Other Findings   Reproductive/Obstetrics negative OB ROS                            Anesthesia Physical Anesthesia Plan  ASA: III  Anesthesia Plan: General   Post-op Pain Management:    Induction: Intravenous  PONV Risk Score and Plan: 4 or greater and Ondansetron, Dexamethasone, Propofol and Midazolam  Airway Management Planned: Video Laryngoscope Planned and Nasal ETT  Additional Equipment:   Intra-op Plan:   Post-operative Plan: Extubation in OR  Informed Consent: I have reviewed the patients History and Physical, chart, labs and discussed the procedure including the risks, benefits and alternatives for the proposed anesthesia with the patient or authorized representative who has indicated his/her understanding and acceptance.   Dental advisory given  Plan Discussed with:  CRNA, Anesthesiologist and Surgeon  Anesthesia Plan Comments:        Anesthesia Quick Evaluation

## 2016-12-03 NOTE — H&P (Signed)
H&P documentation  -History and Physical Reviewed  -Patient has been re-examined  -No change in the plan of care  [name]  

## 2016-12-03 NOTE — Transfer of Care (Signed)
Immediate Anesthesia Transfer of Care Note  Patient: [name]  Procedure(s) Performed: Procedure(s): DENTAL EXTRACTIONS with Alveoloplasty (N/A)  Patient Location: PACU  Anesthesia Type:General  Level of Consciousness: awake  Airway & Oxygen Therapy: Patient Spontanous Breathing and Patient connected to nasal cannula oxygen  Post-op Assessment: Report given to RN and Post -op Vital signs reviewed and stable  Post vital signs: Reviewed and stable  Last Vitals:  Vitals:   12/03/16 1004 12/03/16 1207  BP: 135/81   Pulse: 75 (!) 109  Resp: 18 18  Temp: 36.8 C 36.8 C    Last Pain:  Vitals:   12/03/16 1004  TempSrc: Oral         Complications: No apparent anesthesia complications

## 2016-12-03 NOTE — Anesthesia Procedure Notes (Signed)
Procedure Name: Intubation Date/Time: 12/03/2016 11:22 AM Performed by: Rogelia Boga Pre-anesthesia Checklist: Patient identified, Emergency Drugs available, Suction available, Patient being monitored and Timeout performed Patient Re-evaluated:Patient Re-evaluated prior to inductionOxygen Delivery Method: Circle system utilized Preoxygenation: Pre-oxygenation with 100% oxygen Intubation Type: IV induction Ventilation: Mask ventilation without difficulty Laryngoscope Size: Glidescope and 4 Grade View: Grade I Tube type: Oral Nasal Tubes: Right, Nasal prep performed and Nasal Rae Tube size: 7.0 mm Number of attempts: 1 Airway Equipment and Method: Video-laryngoscopy Placement Confirmation: ETT inserted through vocal cords under direct vision,  positive ETCO2 and breath sounds checked- equal and bilateral Tube secured with: Tape Dental Injury: Teeth and Oropharynx as per pre-operative assessment

## 2016-12-03 NOTE — Op Note (Signed)
[name], [name]                ACCOUNT NO.:  [id number]  MEDICAL RECORD NO.:  [id number]  LOCATION:  AH01C                        FACILITY:  [facility]  PHYSICIAN:  Georgia Lopes, M.D.  DATE OF BIRTH:  14-Feb-1961  DATE OF PROCEDURE:  12/03/2016 DATE OF DISCHARGE:  07/11/2016                              OPERATIVE REPORT   POSTOPERATIVE DIAGNOSIS:  Nonrestorable teeth numbers 6, 12, 21, 22, 23, 24, 25, 26, secondary to dental caries and periodontitis.  POSTOPERATIVE DIAGNOSIS:  Nonrestorable teeth numbers 6, 12, 21, 22, 23, 24, 25, 26, secondary to dental caries and periodontitis.  PROCEDURES: 1. Extraction of teeth numbers 6, 12, 21, 22, 23, 24, 25, 26. 2. Alveoplasty, right and left maxilla and mandible.  SURGEON:  Georgia Lopes, M.D.  ANESTHESIA:  General, nasal intubation.  Dr. Sampson Goon, attending.  DESCRIPTION OF PROCEDURE:  The patient was taken to the operating room and placed on the table in supine position.  General anesthesia was administered intravenously and a nasal endotracheal tube was placed and secured.  The eyes were protected and the patient was draped for surgery.  Time-out was performed.  The posterior pharynx was suctioned. A throat pack was placed.  2% lidocaine with 1:100,000 epinephrine was infiltrated in inferior alveolar block on the left side and right side and then in buccal and palatal infiltration in the right and left maxilla adjacent to teeth numbers 6 and 11, both buccally and palatally. Then, a bite block was placed in the mouth and a Sweetheart retractor was used to retract the tongue.  Attention was turned to the left side first.  A #15 blade was used to make an incision approximately 1 cm proximal to tooth #21 and carried forward both buccally and lingually in the gingival sulcus until tooth #26 was encountered.  Then, a 1 cm proximal incision was made along the gingival crest in this area as well.  In the maxilla, a 15 blade was used  to make an incision around tooth #12 with an extension 1 cm proximal and distal on the alveolar crest.  Then, after the periosteum was reflected from around these teeth, the 301 elevator was used to elevate the lower teeth.  The teeth were removed with an Ash forceps.  The periosteum was reflected to expose the alveolar crest and the rongeur was used to harvest over- contoured bone which would cause undercuts.  This bone was saved for future use in the left maxilla.  Then, egg-shaped bur and bone file were used to perform alveoplasty and then the area was closed with 3-0 chromic in the left maxilla.  The upper forceps was used to remove tooth #12.  Then, the socket of tooth #12 and the former extraction site of tooth #11 were curetted and bone graft was placed that was previously harvested.  This was then closed by undermining the soft tissue with a 15 blade submucosally in the periosteal area and then 3-0 chromic was used to provide primary closure in the left maxilla.  Alveoplasty was performed prior to closure and bone grafting using the egg-shaped bur and bone file.  Then, bite block and sweetheart retractor were repositioned to the left  side of the mouth and then the 15 blade was used to make an incision around tooth #6, 1 cm proximally and 1 cm distally with circumferential incision in the gingival sulcus.  Attempt was made after elevation with a 301 elevator to remove it with a forceps, but the crown fractured.  Then, bone was removed using a Stryker handpiece and fissure bur circumferentially.  The tooth was then removed uneventfully with the upper forceps.  The periosteum was reflected to expose the anterior crest, which contained the canine prominence and this was reduced in an alveoplasty technique with the egg- shaped bur and bone file.  Then, the area was irrigated and closed with 3-0 chromic.  The oral cavity was then irrigated, suctioned, and throat pack was removed.  The  patient was left in the care of Anesthesia for awakening and transportation to recovery room.  ESTIMATED BLOOD LOSS:  Minimal.  COMPLICATIONS:  None.  SPECIMENS:  None.     Georgia Lopes, M.D.     SMJ/MEDQ  D:  12/03/2016  T:  12/03/2016  Job:  [id number]

## 2016-12-03 NOTE — Op Note (Signed)
12/03/2016  11:55 AM  PATIENT:  [name]  56 y.o. female  PRE-OPERATIVE DIAGNOSIS:  NONRESTORABLE TEETH # 6, 12, 21, 22, 23, 24, 25, 26, SECONDARY TO DENTAL CARIES, PERIODONTITIS   POST-OPERATIVE DIAGNOSIS:  SAME  PROCEDURE:  Procedure(s): DENTAL EXTRACTIONS TEETH # 6, 12 21, 22, 23, 24, 25, 26 with Alveoloplasty  SURGEON:  Surgeon(s): Ocie Doyne, DDS  ANESTHESIA:   local and general  EBL:  minimal  DRAINS: none   SPECIMEN:  No Specimen  COUNTS:  YES  PLAN OF CARE: Discharge to home after PACU  PATIENT DISPOSITION:  PACU - hemodynamically stable.   PROCEDURE DETAILS: Dictation # [id number]  Georgia Lopes, DMD 12/03/2016 11:55 AM

## 2016-12-03 NOTE — Anesthesia Postprocedure Evaluation (Signed)
Anesthesia Post Note  Patient: [name]  Procedure(s) Performed: Procedure(s) (LRB): DENTAL EXTRACTIONS with Alveoloplasty (N/A)     Patient location during evaluation: PACU Anesthesia Type: General Level of consciousness: awake and alert Pain management: pain level controlled Vital Signs Assessment: post-procedure vital signs reviewed and stable Respiratory status: spontaneous breathing, nonlabored ventilation and respiratory function stable Cardiovascular status: blood pressure returned to baseline and stable Postop Assessment: no signs of nausea or vomiting Anesthetic complications: no    Last Vitals:  Vitals:   12/03/16 1412 12/03/16 1413  BP: 128/83   Pulse: 91 100  Resp: 18 15  Temp:      Last Pain:  Vitals:   12/03/16 1359  TempSrc:   PainSc: 7                  Keyandra Swenson,W. EDMOND

## 2016-12-04 ENCOUNTER — Encounter (HOSPITAL_COMMUNITY): Payer: Self-pay | Admitting: Oral Surgery

## 2016-12-13 ENCOUNTER — Other Ambulatory Visit: Payer: Self-pay | Admitting: Pharmacist

## 2016-12-13 MED ORDER — DIOVAN 160 MG PO TABS: 160.0000 mg | ORAL_TABLET | Freq: Every day | ORAL | 5 refills | Status: DC

## 2016-12-16 ENCOUNTER — Ambulatory Visit (INDEPENDENT_AMBULATORY_CARE_PROVIDER_SITE_OTHER): Payer: Medicaid Other | Admitting: Cardiovascular Disease

## 2016-12-16 ENCOUNTER — Encounter: Payer: Self-pay | Admitting: Cardiovascular Disease

## 2016-12-16 VITALS — BP 126/86 | HR 83 | Ht 67.0 in | Wt 155.4 lb

## 2016-12-16 DIAGNOSIS — I10 Essential (primary) hypertension: Secondary | ICD-10-CM

## 2016-12-16 DIAGNOSIS — I5022 Chronic systolic (congestive) heart failure: Secondary | ICD-10-CM

## 2016-12-16 NOTE — Progress Notes (Signed)
Cardiology Office Note   Date:  12/16/2016   ID:  [name], DOB February 24, 1961, MRN [id number]  PCP:  Jaclyn Shaggy, MD  Cardiologist:   Kristeen Miss, MD   Chief Complaint  Patient presents with  . Follow-up    CHF    problem list 1. Chronic systolic congestive heart failure 2. Osteomyelitis of the right foot 3. Essential hypertension 4. COPD 5. Diabetes Mellitus    History of Present Illness: Dec. 6, 2016:  [name] is a 56 y.o. female who presents for left arm tingling, heaviness Associated with left chest pressure - pushing sensation  Lasts from 2-4 minutes.   Occurs spontansously .  - at rest and exertion . Walks on occasion - not necessarily associated with walking  Have been going in since Oct.   Went to the ER on Oct. 12.   Saw [facility] in 2010, Wore a monitor - showed some fluttering - she does not recall what it reported.  Had a cath in 2010 for similar issues.    Results and images are not found in epic.    Smokes - 5 cigarettes a day . No ETOH Father had CABG at age 43  Jul 25, 2015:  [name] is seen back  For follow-up of her congestive heart failure. She had a cardiac catheterization which revealed no significant CAD.  her ejection fraction is between 20 and 30%. She's had surgery on her right foot to remove hardware that was placed during a previous surgery.  She has osteomyelitis of her right foot. She has a PICC line and gets antibiotics twice  a day.  No CP  Breathing is ok. Still has palpitations .    September 10, 2016: [name] Is seen back after a year. She has a history of chronic systolic congestive heart failure. She has normal coronary arteries by heart cavitation in December, 2016.  I saw her in January 2017 and set her up for a six-week return visit. She now returns after 14 months. She was hospitalized in Nov with CHF .  Is back on her meds  Still gets 2nd hand smoke.   Breathing has improved.    December 16, 2016:  [name] is  seen back for follow Up of her congestive heart failure. Echocardiogram in April, 2018 revealed normal left ventricular systolic function. She has grade 1 diastolic dysfunction.  Has cut back on her salt . Trying to exercise - walks on occasion.   Recent labs shows markedly elevated glucose   HbA1C is 11.3    Past Medical History:  Diagnosis Date  . Anxiety   . Arthritis   . Chronic systolic heart failure (HCC)    EF 20-25% ECHO 05/2015  . COPD (chronic obstructive pulmonary disease) (HCC)   . Depression   . Diabetes mellitus without complication (HCC)    Type II  . Dysrhythmia    pt unsure of name of arrythmia - " my heart rate will drop all of a sudden" -no current treatment - atrial flutter  . Fibrillation, atrial (HCC)   . Gallstones   . GERD (gastroesophageal reflux disease)   . Headache(784.0)    migraines  . Hypertension   . Osteomyelitis (HCC)    R ankle  . Rash    arms    Past Surgical History:  Procedure Laterality Date  . ANKLE FRACTURE SURGERY Right 2015  . CARDIAC CATHETERIZATION N/A 06/24/2015   Procedure: Left Heart Cath and Coronary Angiography;  Surgeon: [name], MD;  Location: [facility] INVASIVE CV LAB;  Service: Cardiovascular;  Laterality: N/A;  . CARPAL TUNNEL RELEASE     bil  . CHOLECYSTECTOMY  11/24/2011   Procedure: LAPAROSCOPIC CHOLECYSTECTOMY WITH INTRAOPERATIVE CHOLANGIOGRAM;  Surgeon: Clovis Pu. Cornett, MD;  Location: [facility] ORS;  Service: General;  Laterality: N/A;  Laparoscopic Cholecystectomy with Cholangiogram  . COLONOSCOPY    . ECTOPIC PREGNANCY SURGERY  many yrs ago  . HARDWARE REMOVAL Right 07/15/2015   Procedure: RIGHT ANKLE HARDWARE REMOVAL, PLACEMENT OF STIMULAN ANTIBIOTIC BEADS AND PREVENA WOUND VAC.;  Surgeon: [name], MD;  Location: [facility] OR;  Service: Orthopedics;  Laterality: Right;  . TOOTH EXTRACTION N/A 12/03/2016   Procedure: DENTAL EXTRACTIONS with Alveoloplasty;  Surgeon: Ocie Doyne, DDS;  Location: [facility] OR;  Service:  Oral Surgery;  Laterality: N/A;     Current Outpatient Prescriptions  Medication Sig Dispense Refill  . albuterol (PROVENTIL HFA;VENTOLIN HFA) 108 (90 Base) MCG/ACT inhaler Inhale 2 puffs into the lungs every 6 (six) hours as needed for wheezing or shortness of breath. For wheezing 1 Inhaler 3  . bisoprolol (ZEBETA) 5 MG tablet Take 0.5 tablets (2.5 mg total) by mouth daily. 30 tablet 5  . Blood Glucose Monitoring Suppl (ACCU-CHEK AVIVA) device Use as instructed daily. (Patient taking differently: 1 each by Other route 3 (three) times daily. ) 1 each 0  . dicyclomine (BENTYL) 10 MG capsule Take 1 capsule (10 mg total) by mouth 3 (three) times daily before meals. 90 capsule 5  . DIOVAN 160 MG tablet Take 1 tablet (160 mg total) by mouth daily. 30 tablet 5  . esomeprazole (NEXIUM) 20 MG capsule Take 1 capsule (20 mg total) by mouth daily. 30 capsule 5  . FLUoxetine (PROZAC) 40 MG capsule Take 1 capsule (40 mg total) by mouth at bedtime. 30 capsule 5  . glimepiride (AMARYL) 4 MG tablet Take 2 tablets (8 mg total) by mouth daily with breakfast. 60 tablet 5  . glucose blood (ACCU-CHEK AVIVA) test strip Use as instructed daily (Patient taking differently: 1 each by Other route 3 (three) times daily. ) 100 each 12  . hydrOXYzine (ATARAX/VISTARIL) 10 MG tablet Take 1 tablet (10 mg total) by mouth daily. 30 tablet 5  . Lancet Devices (ACCU-CHEK SOFTCLIX) lancets Use as instructed daily. (Patient taking differently: 1 each by Other route 3 (three) times daily. ) 1 each 5  . loperamide (IMODIUM) 2 MG capsule Take 4 mg by mouth 2 (two) times daily.    . meclizine (ANTIVERT) 25 MG tablet Take 1 tablet (25 mg total) by mouth 3 (three) times daily as needed for dizziness. 60 tablet 3  . metFORMIN (GLUCOPHAGE) 500 MG tablet Take 1 tablet (500 mg total) by mouth daily with breakfast. 30 tablet 5  . pravastatin (PRAVACHOL) 40 MG tablet Take 1 tablet (40 mg total) by mouth daily. 30 tablet 5  . tiotropium  (SPIRIVA) 18 MCG inhalation capsule Place 1 capsule (18 mcg total) into inhaler and inhale daily. 30 capsule 5  . aspirin 81 MG EC tablet Take 1 tablet (81 mg total) by mouth daily. (Patient not taking: Reported on 12/16/2016) 30 tablet 2   No current facility-administered medications for this visit.     Allergies:   Biaxin [clarithromycin]; Clarithromycin; Lisinopril; Sulfa antibiotics; Chlorthalidone; Daptomycin; Latex; Amoxicillin-pot clavulanate; Aspirin; Cholestyramine; Dilaudid [hydromorphone hcl]; and Lasix [furosemide]    Social History:  The patient  reports that she has been smoking Cigarettes and E-cigarettes.  She  started smoking about 2 months ago. She has been smoking about 0.00 packs per day for the past 20.00 years. She has never used smokeless tobacco. She reports that she does not drink alcohol or use drugs.   Family History:  The patient's family history includes Breast cancer in her sister; Heart attack (age of onset: 50) in her father; Lung cancer in her father.    ROS:  Please see the history of present illness.    Review of Systems: Constitutional:  denies fever, chills, diaphoresis, appetite change and fatigue.  HEENT: denies photophobia, eye pain, redness, hearing loss, ear pain, congestion, sore throat, rhinorrhea, sneezing, neck pain, neck stiffness and tinnitus.  Respiratory: admits to SOB, DOE, cough, chest tightness, and wheezing.  Cardiovascular: admits to chest pain, palpitations , denies  leg swelling.  Gastrointestinal: denies nausea, vomiting, abdominal pain, diarrhea, constipation, blood in stool.  Genitourinary: denies dysuria, urgency, frequency, hematuria, flank pain and difficulty urinating.  Musculoskeletal: denies  myalgias, back pain, joint swelling, arthralgias and gait problem.   Skin: denies pallor, rash and wound.  Neurological: denies dizziness, seizures, syncope, weakness, light-headedness, numbness and headaches.   Hematological: denies  adenopathy, easy bruising, personal or family bleeding history.  Psychiatric/ Behavioral: denies suicidal ideation, mood changes, confusion, nervousness, sleep disturbance and agitation.       All other systems are reviewed and negative.    PHYSICAL EXAM: VS:  BP 126/86   Pulse 83   Ht 5\' 7"  (1.702 m)   Wt 155 lb 6.4 oz (70.5 kg)   LMP 07/24/2011   SpO2 96%   BMI 24.34 kg/m  , BMI Body mass index is 24.34 kg/m. GEN: Well nourished, well developed, in no acute distress  HEENT: normal  Neck: no JVD, carotid bruits, or masses Cardiac: RRR; no murmurs, rubs, or gallops,no edema  Respiratory:  Coarse wheezing - especially in the right base .    GI: soft, nontender, nondistended, + BS MS: no deformity or atrophy  Skin: warm and dry, no rash Neuro:  Strength and sensation are intact Psych: normal   EKG:  EKG is ordered today. The ekg ordered today demonstrates NSR at 90.  LBBB  - the LBBB is new since her ECG in 2013.    Recent Labs: 05/24/2016: B Natriuretic Peptide 49.1 08/11/2016: ALT 28 11/30/2016: BUN 5; Creatinine, Ser 0.61; Hemoglobin 15.2; Platelets 121; Potassium 3.1; Sodium 136    Lipid Panel    Component Value Date/Time   CHOL 144 07/05/2016 0947   TRIG 120 07/05/2016 0947   HDL 28 (L) 07/05/2016 0947   CHOLHDL 5.1 (H) 07/05/2016 0947   VLDL UNABLE TO CALCULATE IF TRIGLYCERIDE OVER 400 mg/dL 71/16/5790 3833   LDLCALC UNABLE TO CALCULATE IF TRIGLYCERIDE OVER 400 mg/dL 38/32/9191 6606      Wt Readings from Last 3 Encounters:  12/16/16 155 lb 6.4 oz (70.5 kg)  12/03/16 155 lb (70.3 kg)  11/30/16 156 lb 8 oz (71 kg)      Other studies Reviewed: Additional studies/ records that were reviewed today include: . Review of the above records demonstrates:    ASSESSMENT AND PLAN:  1.  Chronic systolic CHF Kiayla  Has an EF of 20-30% Which has now improved to a normal ejection fraction on bisoprolol and valsartan. She'll continue to avoid sets of salt.  2.  HTN:  BP is much better now. current medications.  3. Hypokalemia:  Her last potassium level was 3.1. She is not having any palpitations. I have  encouraged her to eat more foods that are high in potassium.     Current medicines are reviewed at length with the patient today.  The patient does not have concerns regarding medicines.  The following changes have been made:  no change  Labs/ tests ordered today include:  No orders of the defined types were placed in this encounter.    Disposition:   FU with  Me in 6 months    Kristeen Miss, MD  12/16/2016 10:09 AM    [facility] [address] [place], [place], [place]  [address] Phone: [phone]; Fax: [phone]

## 2016-12-16 NOTE — Patient Instructions (Addendum)
Medication Instructions:  Your physician recommends that you continue on your current medications as directed. Please refer to the Current Medication list given to you today.   Labwork: Your physician recommends that you return for lab work in: 1 month for basic metabolic panel   Testing/Procedures: None Ordered   Follow-Up: Your physician wants you to follow-up in: 6 months with Dr. Elease Hashimoto.  You will receive a reminder letter in the mail two months in advance. If you don't receive a letter, please call our office to schedule the follow-up appointment.'   If you need a refill on your cardiac medications before your next appointment, please call your pharmacy.   Thank you for choosing [facility]! Eligha Bridegroom, RN [phone]     Hypokalemia Hypokalemia means that the amount of potassium in the blood is lower than normal.Potassium is a chemical that helps regulate the amount of fluid in the body (electrolyte). It also stimulates muscle tightening (contraction) and helps nerves work properly.Normally, most of the body's potassium is inside of cells, and only a very small amount is in the blood. Because the amount in the blood is so small, minor changes to potassium levels in the blood can be life-threatening. What are the causes? This condition may be caused by:  Antibiotic medicine.  Diarrhea or vomiting. Taking too much of a medicine that helps you have a bowel movement (laxative) can cause diarrhea and lead to hypokalemia.  Chronic kidney disease (CKD).  Medicines that help the body get rid of excess fluid (diuretics).  Eating disorders, such as bulimia.  Low magnesium levels in the body.  Sweating a lot.  What are the signs or symptoms? Symptoms of this condition include:  Weakness.  Constipation.  Fatigue.  Muscle cramps.  Mental confusion.  Skipped heartbeats or irregular heartbeat (palpitations).  Tingling or numbness.  How is this  diagnosed? This condition is diagnosed with a blood test. How is this treated? Hypokalemia can be treated by taking potassium supplements by mouth or adjusting the medicines that you take. Treatment may also include eating more foods that contain a lot of potassium. If your potassium level is very low, you may need to get potassium through an IV tube in one of your veins and be monitored in the hospital. Follow these instructions at home:  Take over-the-counter and prescription medicines only as told by your health care provider. This includes vitamins and supplements.  Eat a healthy diet. A healthy diet includes fresh fruits and vegetables, whole grains, healthy fats, and lean proteins.  If instructed, eat more foods that contain a lot of potassium, such as: ? Nuts, such as peanuts and pistachios. ? Seeds, such as sunflower seeds and pumpkin seeds. ? Peas, lentils, and lima beans. ? Whole grain and bran cereals and breads. ? Fresh fruits and vegetables, such as apricots, avocado, bananas, cantaloupe, kiwi, oranges, tomatoes, asparagus, and potatoes. ? Orange juice. ? Tomato juice. ? Red meats. ? Yogurt.  Keep all follow-up visits as told by your health care provider. This is important. Contact a health care provider if:  You have weakness that gets worse.  You feel your heart pounding or racing.  You vomit.  You have diarrhea.  You have diabetes (diabetes mellitus) and you have trouble keeping your blood sugar (glucose) in your target range. Get help right away if:  You have chest pain.  You have shortness of breath.  You have vomiting or diarrhea that lasts for more than 2 days.  You  faint. This information is not intended to replace advice given to you by your health care provider. Make sure you discuss any questions you have with your health care provider. Document Released: 06/14/2005 Document Revised: 01/31/2016 Document Reviewed: 01/31/2016 Elsevier Interactive  Patient Education  2018 [employer].

## 2016-12-22 ENCOUNTER — Ambulatory Visit (INDEPENDENT_AMBULATORY_CARE_PROVIDER_SITE_OTHER): Payer: Medicaid Other | Admitting: Orthopedic Surgery

## 2016-12-22 ENCOUNTER — Encounter (INDEPENDENT_AMBULATORY_CARE_PROVIDER_SITE_OTHER): Payer: Self-pay | Admitting: Orthopedic Surgery

## 2016-12-22 DIAGNOSIS — M65311 Trigger thumb, right thumb: Secondary | ICD-10-CM

## 2016-12-22 MED ORDER — OXYCODONE-ACETAMINOPHEN 5-325 MG PO TABS: 1.0000 | ORAL_TABLET | Freq: Two times a day (BID) | ORAL | 0 refills | Status: DC | PRN

## 2016-12-22 MED ORDER — HYDROCODONE-ACETAMINOPHEN 5-325 MG PO TABS: 1.0000 | ORAL_TABLET | Freq: Two times a day (BID) | ORAL | 0 refills | Status: DC | PRN

## 2016-12-23 DIAGNOSIS — M65311 Trigger thumb, right thumb: Secondary | ICD-10-CM

## 2016-12-23 MED ORDER — LIDOCAINE HCL 1 % IJ SOLN
1.0000 mL | INTRAMUSCULAR | Status: AC | PRN
  Administered 2016-12-23: 1 mL

## 2016-12-23 MED ORDER — BUPIVACAINE HCL 0.25 % IJ SOLN
0.3300 mL | INTRAMUSCULAR | Status: AC | PRN
  Administered 2016-12-23: .33 mL

## 2016-12-23 MED ORDER — METHYLPREDNISOLONE ACETATE 40 MG/ML IJ SUSP
13.3300 mg | INTRAMUSCULAR | Status: AC | PRN
  Administered 2016-12-23: 13.33 mg

## 2016-12-23 NOTE — Progress Notes (Signed)
Office Visit Note   Patient: [name]           Date of Birth: 08-28-1960           MRN: [id number] Visit Date: 12/22/2016 Requested by: Jaclyn Shaggy, MD [address] [place], [place] [address] PCP: Jaclyn Shaggy, MD  Subjective: Chief Complaint  Patient presents with  . Hand Pain    right thumb    HPI: Wittke is a patient who is now status post right index finger A1 pulley release.  Doing recently well from that but now she reports several week history of constant right thumb triggering.  Sperry painful for her.  Taking Tylenol for pain which is not helping enough in her opinion.  She is right-hand dominant.  She states of the thumb locks every time she bends at the IP joint.              ROS: All systems reviewed are negative as they relate to the chief complaint within the history of present illness.  Patient denies  fevers or chills.   Assessment & Plan: Visit Diagnoses:  1. Trigger thumb, right thumb     Plan: Impression is right trigger thumb.  Plan is ultrasound-guided injection into the space between the tendon sheath and the A1 pulley.  Also want to put her in a thumb spica splint.  Also want to try topical anti-inflammatory.  If this doesn't work then she will need to have that trigger thumb release.  Follow-Up Instructions: Return in about 4 weeks (around 01/19/2017).   Orders:  No orders of the defined types were placed in this encounter.  Meds ordered this encounter  Medications  . DISCONTD: HYDROcodone-acetaminophen (NORCO/VICODIN) 5-325 MG tablet    Sig: Take 1 tablet by mouth every 12 (twelve) hours as needed for moderate pain.    Dispense:  20 tablet    Refill:  0  . oxyCODONE-acetaminophen (PERCOCET/ROXICET) 5-325 MG tablet    Sig: Take 1-2 tablets by mouth every 12 (twelve) hours as needed for severe pain.    Dispense:  15 tablet    Refill:  0      Procedures: Hand/UE Inj Date/Time: 12/23/2016 4:32 PM Performed by: [name] Authorized by: [name]   Consent Given by:  Patient Site marked: the procedure site was marked   Timeout: prior to procedure the correct patient, procedure, and site was verified   Indications:  Therapeutic Condition: trigger finger   Location:  Thumb Site:  R thumb A1 Prep: patient was prepped and draped in usual sterile fashion   Needle Size:  25 G Approach:  Volar Medications:  1 mL lidocaine 1 %; 0.33 mL bupivacaine 0.25 %; 13.33 mg methylPREDNISolone acetate 40 MG/ML Patient tolerance:  Patient tolerated the procedure well with no immediate complications     Clinical Data: No additional findings.  Objective: Vital Signs: LMP 07/24/2011   Physical Exam:   Constitutional: Patient appears well-developed HEENT:  Head: Normocephalic Eyes:EOM are normal Neck: Normal range of motion Cardiovascular: Normal rate Pulmonary/chest: Effort normal Neurologic: Patient is alert Skin: Skin is warm Psychiatric: Patient has normal mood and affect    Ortho Exam: Orthopedic exam demonstrates active range of motion of the index finger to within about a centimeter of the palm.  Composite flexion is not quite as great his digits 34 and 5 but it is improving.  Patient does have significant tenderness to palpation at the base of the A1 pulley  on the right thumb.  She does have triggering with every flexion moment at the IP joint.  Collateral ligament stable at the MCP joint.  EPL FPL function is intact.  Specialty Comments:  No specialty comments available.  Imaging: No results found.   PMFS History: Patient Active Problem List   Diagnosis Date Noted  . 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 Primary osteoarthritis of both hands 10/06/2016  . History of gastroesophageal reflux (GERD) 10/06/2016  . History of COPD 10/06/2016  . History of anxiety 10/06/2016  . History of CHF (congestive heart failure) 10/06/2016  . History of hypertension 10/06/2016  . History of  cardiac dysrhythmia 10/06/2016  . Primary osteoarthritis of both feet 10/06/2016  . Primary osteoarthritis of both knees 10/06/2016  . History of infection/ ankle post operative  10/06/2016  . Rheumatoid factor positive 10/06/2016  . De Quervain's disease (radial styloid tenosynovitis) 06/09/2016  . Tobacco abuse 05/23/2016  . Hyperglycemia without ketosis   . Bleeding hemorrhoids 11/03/2015  . Acute blood loss anemia 11/03/2015  . Hematochezia 11/01/2015  . Back pain with sciatica 09/08/2015  . Infection of bone of ankle (HCC) 07/15/2015  . Chronic systolic CHF (congestive heart failure) (HCC) 06/24/2015  . Chest pain 06/03/2015  . Hypertension   . Anxiety   . Gallstones   . GERD (gastroesophageal reflux disease)   . COPD (chronic obstructive pulmonary disease) (HCC)   . Dysrhythmia   . Rash   . Depression   . Diabetes mellitus without complication (HCC)   . Post-operative state 12/10/2011   Past Medical History:  Diagnosis Date  . Anxiety   . Arthritis   . Chronic systolic heart failure (HCC)    EF 20-25% ECHO 05/2015  . COPD (chronic obstructive pulmonary disease) (HCC)   . Depression   . Diabetes mellitus without complication (HCC)    Type II  . Dysrhythmia    pt unsure of name of arrythmia - " my heart rate will drop all of a sudden" -no current treatment - atrial flutter  . Fibrillation, atrial (HCC)   . Gallstones   . GERD (gastroesophageal reflux disease)   . Headache(784.0)    migraines  . Hypertension   . Osteomyelitis (HCC)    R ankle  . Rash    arms    Family History  Problem Relation Age of Onset  . Lung cancer Father   . Heart attack Father 38       CABG x4  . Breast cancer Sister        Survivor     Past Surgical History:  Procedure Laterality Date  . ANKLE FRACTURE SURGERY Right 2015  . CARDIAC CATHETERIZATION N/A 06/24/2015   Procedure: Left Heart Cath and Coronary Angiography;  Surgeon: [name], MD;  Location: [facility] INVASIVE CV LAB;   Service: Cardiovascular;  Laterality: N/A;  . CARPAL TUNNEL RELEASE     bil  . CHOLECYSTECTOMY  11/24/2011   Procedure: LAPAROSCOPIC CHOLECYSTECTOMY WITH INTRAOPERATIVE CHOLANGIOGRAM;  Surgeon: Clovis Pu. Cornett, MD;  Location: [facility] ORS;  Service: General;  Laterality: N/A;  Laparoscopic Cholecystectomy with Cholangiogram  . COLONOSCOPY    . ECTOPIC PREGNANCY SURGERY  many yrs ago  . HARDWARE REMOVAL Right 07/15/2015   Procedure: RIGHT ANKLE HARDWARE REMOVAL, PLACEMENT OF STIMULAN ANTIBIOTIC BEADS AND PREVENA WOUND VAC.;  Surgeon: [name], MD;  Location: [facility] OR;  Service: Orthopedics;  Laterality: Right;  . TOOTH EXTRACTION N/A 12/03/2016   Procedure: DENTAL EXTRACTIONS with Alveoloplasty;  Surgeon: Ocie Doyne, DDS;  Location: [facility] OR;  Service: Oral Surgery;  Laterality: N/A;   Social History   Occupational History  . Not on file.   Social History Main Topics  . Smoking status: Light Tobacco Smoker    Packs/day: 0.00    Years: 20.00    Types: Cigarettes, E-cigarettes    Start date: 09/19/2016  . Smokeless tobacco: Never Used  . Alcohol use No  . Drug use: No  . Sexual activity: Not Currently    Birth control/ protection: Post-menopausal

## 2017-01-12 ENCOUNTER — Encounter: Payer: Self-pay | Admitting: Pharmacist

## 2017-01-12 ENCOUNTER — Encounter: Payer: Medicaid Other | Admitting: Family Medicine

## 2017-01-12 NOTE — Progress Notes (Signed)
Received prior authorization request for bisoprolol. Patient with [place] [employer]. Must fail metoprolol and carvedilol first or have compelling indication for bisoprolol. Will place request in Dr. Jen Mow box

## 2017-01-17 ENCOUNTER — Other Ambulatory Visit: Payer: Medicaid Other

## 2017-01-20 ENCOUNTER — Encounter (INDEPENDENT_AMBULATORY_CARE_PROVIDER_SITE_OTHER): Payer: Self-pay | Admitting: Orthopedic Surgery

## 2017-01-20 ENCOUNTER — Ambulatory Visit (INDEPENDENT_AMBULATORY_CARE_PROVIDER_SITE_OTHER): Payer: Medicaid Other | Admitting: Orthopedic Surgery

## 2017-01-20 DIAGNOSIS — M19041 Primary osteoarthritis, right hand: Secondary | ICD-10-CM | POA: Diagnosis not present

## 2017-01-20 DIAGNOSIS — M19042 Primary osteoarthritis, left hand: Secondary | ICD-10-CM

## 2017-01-20 NOTE — Progress Notes (Signed)
Office Visit Note   Patient: [name]           Date of Birth: June 03, 1961           MRN: [id number] Visit Date: 01/20/2017 Requested by: Jaclyn Shaggy, MD [address] [place], [place] [address] PCP: Jaclyn Shaggy, MD  Subjective: Chief Complaint  Patient presents with  . Left Hand - Pain  . Right Hand - Pain    HPI: Tetrault is a 56 year old patient with left wrist pain.  She localizes the pain to the radial styloid.  She had ultrasound guided first dorsal compartment section which gave her reasonable relief for several weeks but the pain is recurred.  She recently had right trigger thumb injection which helped her clinical symptoms but that triggering has recurred to some degree in the right thumb.  She states that the left wrist is more symptomatic at this time.  She's doing well from right index finger trigger finger release.              ROS: All systems reviewed are negative as they relate to the chief complaint within the history of present illness.  Patient denies  fevers or chills.   Assessment & Plan: Visit Diagnoses:  1. Primary osteoarthritis of both hands     Plan: Impression is left de Quervain's tenosynovitis refractory to nonoperative management.  Plan is surgical release.  Risks and benefits discussed including but limited to infection or vessel damage incomplete pain relief.  Anticipate splint use for 10 days post surgery with removal of sutures at that time.  Patient stands the risks benefits and wishes to proceed.  All questions answered  Follow-Up Instructions: No Follow-up on file.   Orders:  No orders of the defined types were placed in this encounter.  No orders of the defined types were placed in this encounter.     Procedures: No procedures performed   Clinical Data: No additional findings.  Objective: Vital Signs: LMP 07/24/2011   Physical Exam:   Constitutional: Patient appears well-developed HEENT:  Head:  Normocephalic Eyes:EOM are normal Neck: Normal range of motion Cardiovascular: Normal rate Pulmonary/chest: Effort normal Neurologic: Patient is alert Skin: Skin is warm Psychiatric: Patient has normal mood and affect    Ortho Exam: Orthopedic exam demonstrates tenderness over the radial styloid of the left wrist.  Finkelstein's test positive.  EPL FPL function is intact.  Radial pulses intact.  Elbow range of motion wrist range of motion on the left is normal.  Specialty Comments:  No specialty comments available.  Imaging: No results found.   PMFS History: Patient Active Problem List   Diagnosis Date Noted  . 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 Primary osteoarthritis of both hands 10/06/2016  . History of gastroesophageal reflux (GERD) 10/06/2016  . History of COPD 10/06/2016  . History of anxiety 10/06/2016  . History of CHF (congestive heart failure) 10/06/2016  . History of hypertension 10/06/2016  . History of cardiac dysrhythmia 10/06/2016  . Primary osteoarthritis of both feet 10/06/2016  . Primary osteoarthritis of both knees 10/06/2016  . History of infection/ ankle post operative  10/06/2016  . Rheumatoid factor positive 10/06/2016  . De Quervain's disease (radial styloid tenosynovitis) 06/09/2016  . Tobacco abuse 05/23/2016  . Hyperglycemia without ketosis   . Bleeding hemorrhoids 11/03/2015  . Acute blood loss anemia 11/03/2015  . Hematochezia 11/01/2015  . Back pain with sciatica 09/08/2015  . Infection of bone of ankle (HCC) 07/15/2015  . Chronic  systolic CHF (congestive heart failure) (HCC) 06/24/2015  . Chest pain 06/03/2015  . Hypertension   . Anxiety   . Gallstones   . GERD (gastroesophageal reflux disease)   . COPD (chronic obstructive pulmonary disease) (HCC)   . Dysrhythmia   . Rash   . Depression   . Diabetes mellitus without complication (HCC)   . Post-operative state 12/10/2011   Past Medical History:  Diagnosis Date  . Anxiety   .  Arthritis   . Chronic systolic heart failure (HCC)    EF 20-25% ECHO 05/2015  . COPD (chronic obstructive pulmonary disease) (HCC)   . Depression   . Diabetes mellitus without complication (HCC)    Type II  . Dysrhythmia    pt unsure of name of arrythmia - " my heart rate will drop all of a sudden" -no current treatment - atrial flutter  . Fibrillation, atrial (HCC)   . Gallstones   . GERD (gastroesophageal reflux disease)   . Headache(784.0)    migraines  . Hypertension   . Osteomyelitis (HCC)    R ankle  . Rash    arms    Family History  Problem Relation Age of Onset  . Lung cancer Father   . Heart attack Father 56       CABG x4  . Breast cancer Sister        Survivor     Past Surgical History:  Procedure Laterality Date  . ANKLE FRACTURE SURGERY Right 2015  . CARDIAC CATHETERIZATION N/A 06/24/2015   Procedure: Left Heart Cath and Coronary Angiography;  Surgeon: [name], MD;  Location: [facility] INVASIVE CV LAB;  Service: Cardiovascular;  Laterality: N/A;  . CARPAL TUNNEL RELEASE     bil  . CHOLECYSTECTOMY  11/24/2011   Procedure: LAPAROSCOPIC CHOLECYSTECTOMY WITH INTRAOPERATIVE CHOLANGIOGRAM;  Surgeon: Clovis Pu. Cornett, MD;  Location: [facility] ORS;  Service: General;  Laterality: N/A;  Laparoscopic Cholecystectomy with Cholangiogram  . COLONOSCOPY    . ECTOPIC PREGNANCY SURGERY  many yrs ago  . HARDWARE REMOVAL Right 07/15/2015   Procedure: RIGHT ANKLE HARDWARE REMOVAL, PLACEMENT OF STIMULAN ANTIBIOTIC BEADS AND PREVENA WOUND VAC.;  Surgeon: [name], MD;  Location: [facility] OR;  Service: Orthopedics;  Laterality: Right;  . TOOTH EXTRACTION N/A 12/03/2016   Procedure: DENTAL EXTRACTIONS with Alveoloplasty;  Surgeon: Ocie Doyne, DDS;  Location: [facility] OR;  Service: Oral Surgery;  Laterality: N/A;   Social History   Occupational History  . Not on file.   Social History Main Topics  . Smoking status: Light Tobacco Smoker    Packs/day: 0.00    Years: 20.00    Types:  Cigarettes, E-cigarettes    Start date: 09/19/2016  . Smokeless tobacco: Never Used  . Alcohol use No  . Drug use: No  . Sexual activity: Not Currently    Birth control/ protection: Post-menopausal

## 2017-01-21 ENCOUNTER — Other Ambulatory Visit: Payer: Self-pay | Admitting: Family Medicine

## 2017-01-21 MED ORDER — METOPROLOL SUCCINATE ER 25 MG PO TB24: 25.0000 mg | ORAL_TABLET | Freq: Every day | ORAL | 5 refills | Status: DC

## 2017-01-24 DIAGNOSIS — M65311 Trigger thumb, right thumb: Secondary | ICD-10-CM | POA: Diagnosis not present

## 2017-01-28 ENCOUNTER — Telehealth (INDEPENDENT_AMBULATORY_CARE_PROVIDER_SITE_OTHER): Payer: Self-pay | Admitting: *Deleted

## 2017-01-28 NOTE — Telephone Encounter (Signed)
Pt is dean pt, she just had surgery and her wrap on her hand is coming off, it is rubbing where the incision is and she wants to get it re-wrapped.

## 2017-01-28 NOTE — Telephone Encounter (Signed)
Unsure of the "spint" he uses but she can probably just re-wrap I would assume right? Can you check with Denny Peon please?

## 2017-01-28 NOTE — Telephone Encounter (Signed)
Pt is s/p a deQuervain release and had splint placed. She can either re-wrap or come in and erin said you can give her a thumb spica Velcro splint

## 2017-01-28 NOTE — Telephone Encounter (Signed)
Patient coming to the office

## 2017-02-02 ENCOUNTER — Encounter (INDEPENDENT_AMBULATORY_CARE_PROVIDER_SITE_OTHER): Payer: Self-pay | Admitting: Orthopedic Surgery

## 2017-02-02 ENCOUNTER — Ambulatory Visit (INDEPENDENT_AMBULATORY_CARE_PROVIDER_SITE_OTHER): Payer: Medicaid Other | Admitting: Orthopedic Surgery

## 2017-02-02 DIAGNOSIS — M654 Radial styloid tenosynovitis [de Quervain]: Secondary | ICD-10-CM

## 2017-02-02 NOTE — Progress Notes (Signed)
Post-Op Visit Note   Patient: [name]           Date of Birth: September 16, 1960           MRN: [id number] Visit Date: 02/02/2017 PCP: Jaclyn Shaggy, MD   Assessment & Plan:  Chief Complaint:  Chief Complaint  Patient presents with  . Routine Post Op    LT DEQUERVAINS/EXTENSOR TENDON 01/24/17   Visit Diagnoses: No diagnosis found.  Plan: Lout is a 56 year old with de Quervain's tenosynovitis release 1 week ago.  On exam incision is intact.  Sutures removed.  I will have her start doing some wrist range of motion exercises radial and ulnar deviation along with some flexion and extension.  No lifting with that left wrist.  Her right thumb is giving her problems but she wants to wait on that.  I'll see her back in 4 weeks for final check on the wrist.  Ace wrap applied today.  Follow-Up Instructions: Return in about 4 weeks (around 03/02/2017).   Orders:  No orders of the defined types were placed in this encounter.  No orders of the defined types were placed in this encounter.   Imaging: No results found.  PMFS History: Patient Active Problem List   Diagnosis Date Noted  . 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 Primary osteoarthritis of both hands 10/06/2016  . History of gastroesophageal reflux (GERD) 10/06/2016  . History of COPD 10/06/2016  . History of anxiety 10/06/2016  . History of CHF (congestive heart failure) 10/06/2016  . History of hypertension 10/06/2016  . History of cardiac dysrhythmia 10/06/2016  . Primary osteoarthritis of both feet 10/06/2016  . Primary osteoarthritis of both knees 10/06/2016  . History of infection/ ankle post operative  10/06/2016  . Rheumatoid factor positive 10/06/2016  . De Quervain's disease (radial styloid tenosynovitis) 06/09/2016  . Tobacco abuse 05/23/2016  . Hyperglycemia without ketosis   . Bleeding hemorrhoids 11/03/2015  . Acute blood loss anemia 11/03/2015  . Hematochezia 11/01/2015  . Back pain with sciatica 09/08/2015  .  Infection of bone of ankle (HCC) 07/15/2015  . Chronic systolic CHF (congestive heart failure) (HCC) 06/24/2015  . Chest pain 06/03/2015  . Hypertension   . Anxiety   . Gallstones   . GERD (gastroesophageal reflux disease)   . COPD (chronic obstructive pulmonary disease) (HCC)   . Dysrhythmia   . Rash   . Depression   . Diabetes mellitus without complication (HCC)   . Post-operative state 12/10/2011   Past Medical History:  Diagnosis Date  . Anxiety   . Arthritis   . Chronic systolic heart failure (HCC)    EF 20-25% ECHO 05/2015  . COPD (chronic obstructive pulmonary disease) (HCC)   . Depression   . Diabetes mellitus without complication (HCC)    Type II  . Dysrhythmia    pt unsure of name of arrythmia - " my heart rate will drop all of a sudden" -no current treatment - atrial flutter  . Fibrillation, atrial (HCC)   . Gallstones   . GERD (gastroesophageal reflux disease)   . Headache(784.0)    migraines  . Hypertension   . Osteomyelitis (HCC)    R ankle  . Rash    arms    Family History  Problem Relation Age of Onset  . Lung cancer Father   . Heart attack Father 67       CABG x4  . Breast cancer Sister        Survivor  Past Surgical History:  Procedure Laterality Date  . ANKLE FRACTURE SURGERY Right 2015  . CARDIAC CATHETERIZATION N/A 06/24/2015   Procedure: Left Heart Cath and Coronary Angiography;  Surgeon: [name], MD;  Location: [facility] INVASIVE CV LAB;  Service: Cardiovascular;  Laterality: N/A;  . CARPAL TUNNEL RELEASE     bil  . CHOLECYSTECTOMY  11/24/2011   Procedure: LAPAROSCOPIC CHOLECYSTECTOMY WITH INTRAOPERATIVE CHOLANGIOGRAM;  Surgeon: Clovis Pu. Cornett, MD;  Location: [facility] ORS;  Service: General;  Laterality: N/A;  Laparoscopic Cholecystectomy with Cholangiogram  . COLONOSCOPY    . ECTOPIC PREGNANCY SURGERY  many yrs ago  . HARDWARE REMOVAL Right 07/15/2015   Procedure: RIGHT ANKLE HARDWARE REMOVAL, PLACEMENT OF STIMULAN ANTIBIOTIC BEADS AND  PREVENA WOUND VAC.;  Surgeon: [name], MD;  Location: [facility] OR;  Service: Orthopedics;  Laterality: Right;  . TOOTH EXTRACTION N/A 12/03/2016   Procedure: DENTAL EXTRACTIONS with Alveoloplasty;  Surgeon: Ocie Doyne, DDS;  Location: [facility] OR;  Service: Oral Surgery;  Laterality: N/A;   Social History   Occupational History  . Not on file.   Social History Main Topics  . Smoking status: Light Tobacco Smoker    Packs/day: 0.00    Years: 20.00    Types: Cigarettes, E-cigarettes    Start date: 09/19/2016  . Smokeless tobacco: Never Used  . Alcohol use No  . Drug use: No  . Sexual activity: Not Currently    Birth control/ protection: Post-menopausal

## 2017-02-03 ENCOUNTER — Other Ambulatory Visit (INDEPENDENT_AMBULATORY_CARE_PROVIDER_SITE_OTHER): Payer: Self-pay | Admitting: Orthopedic Surgery

## 2017-02-03 ENCOUNTER — Telehealth (INDEPENDENT_AMBULATORY_CARE_PROVIDER_SITE_OTHER): Payer: Self-pay | Admitting: Orthopedic Surgery

## 2017-02-03 MED ORDER — OXYCODONE-ACETAMINOPHEN 5-325 MG PO TABS: 1.0000 | ORAL_TABLET | Freq: Two times a day (BID) | ORAL | 0 refills | Status: DC | PRN

## 2017-02-03 NOTE — Telephone Encounter (Signed)
Patient called asking for a refill on oxycodone and wanted to know if the dosage could be 5 325? CB # [phone]

## 2017-02-03 NOTE — Telephone Encounter (Signed)
rx written

## 2017-02-04 NOTE — Telephone Encounter (Signed)
I called and spoke with the patient to advise rx at front desk to pick up.

## 2017-02-11 ENCOUNTER — Telehealth (INDEPENDENT_AMBULATORY_CARE_PROVIDER_SITE_OTHER): Payer: Self-pay

## 2017-02-11 NOTE — Telephone Encounter (Signed)
Can you advise? Refill, change or hold for Dean? She had Dequervains surgery

## 2017-02-11 NOTE — Telephone Encounter (Signed)
Patient would like a Rx refill on Oxycodone5-325mg .  Cb# is [phone].  Please advise. Thank you.

## 2017-02-14 ENCOUNTER — Other Ambulatory Visit (INDEPENDENT_AMBULATORY_CARE_PROVIDER_SITE_OTHER): Payer: Self-pay | Admitting: Family

## 2017-02-14 MED ORDER — OXYCODONE-ACETAMINOPHEN 5-325 MG PO TABS: 1.0000 | ORAL_TABLET | Freq: Two times a day (BID) | ORAL | 0 refills | Status: DC | PRN

## 2017-02-14 NOTE — Telephone Encounter (Signed)
IC LMVM advised could pick up at front desk.

## 2017-02-16 ENCOUNTER — Emergency Department (HOSPITAL_COMMUNITY): Payer: Medicaid Other

## 2017-02-16 ENCOUNTER — Encounter (HOSPITAL_COMMUNITY): Payer: Self-pay | Admitting: *Deleted

## 2017-02-16 DIAGNOSIS — Z7982 Long term (current) use of aspirin: Secondary | ICD-10-CM | POA: Diagnosis not present

## 2017-02-16 DIAGNOSIS — Z7984 Long term (current) use of oral hypoglycemic drugs: Secondary | ICD-10-CM | POA: Insufficient documentation

## 2017-02-16 DIAGNOSIS — F17210 Nicotine dependence, cigarettes, uncomplicated: Secondary | ICD-10-CM | POA: Insufficient documentation

## 2017-02-16 DIAGNOSIS — E119 Type 2 diabetes mellitus without complications: Secondary | ICD-10-CM | POA: Insufficient documentation

## 2017-02-16 DIAGNOSIS — H81399 Other peripheral vertigo, unspecified ear: Secondary | ICD-10-CM | POA: Insufficient documentation

## 2017-02-16 DIAGNOSIS — Z880 Allergy status to penicillin: Secondary | ICD-10-CM | POA: Insufficient documentation

## 2017-02-16 DIAGNOSIS — J449 Chronic obstructive pulmonary disease, unspecified: Secondary | ICD-10-CM | POA: Diagnosis not present

## 2017-02-16 DIAGNOSIS — Z91040 Latex allergy status: Secondary | ICD-10-CM | POA: Diagnosis not present

## 2017-02-16 DIAGNOSIS — Z79899 Other long term (current) drug therapy: Secondary | ICD-10-CM | POA: Diagnosis not present

## 2017-02-16 DIAGNOSIS — I110 Hypertensive heart disease with heart failure: Secondary | ICD-10-CM | POA: Diagnosis not present

## 2017-02-16 DIAGNOSIS — R42 Dizziness and giddiness: Secondary | ICD-10-CM | POA: Diagnosis present

## 2017-02-16 DIAGNOSIS — I5022 Chronic systolic (congestive) heart failure: Secondary | ICD-10-CM | POA: Insufficient documentation

## 2017-02-16 LAB — BASIC METABOLIC PANEL
ANION GAP: 10 (ref 5–15)
BUN: 6 mg/dL (ref 6–20)
CO2: 21 mmol/L — AB (ref 22–32)
Calcium: 9.1 mg/dL (ref 8.9–10.3)
Chloride: 106 mmol/L (ref 101–111)
Creatinine, Ser: 0.57 mg/dL (ref 0.44–1.00)
GFR calc Af Amer: >60 mL/min (ref >60)
GFR calc non Af Amer: >60 mL/min (ref >60)
GLUCOSE: 201 mg/dL — AB (ref 65–99)
Potassium: 3.5 mmol/L (ref 3.5–5.1)
Sodium: 137 mmol/L (ref 135–145)

## 2017-02-16 LAB — CBC
HCT: 45.4 % (ref 36.0–46.0)
Hemoglobin: 15.7 g/dL — ABNORMAL HIGH (ref 12.0–15.0)
MCH: 29.2 pg (ref 26.0–34.0)
MCHC: 34.6 g/dL (ref 30.0–36.0)
MCV: 84.4 fL (ref 78.0–100.0)
PLATELETS: 171 10*3/uL (ref 150–400)
RBC: 5.38 MIL/uL — ABNORMAL HIGH (ref 3.87–5.11)
RDW: 13.1 % (ref 11.5–15.5)
WBC: 10.5 10*3/uL (ref 4.0–10.5)

## 2017-02-16 LAB — TROPONIN I

## 2017-02-16 NOTE — ED Triage Notes (Signed)
Pt that when she has a bowel movement she has notices blood when she wipes for a couple of days, reports symptoms have improved. About mid day she started having dizziness when she bent over, chest tightness and disorientation. She took meclizine today without relief.

## 2017-02-17 ENCOUNTER — Emergency Department (HOSPITAL_COMMUNITY)
Admission: EM | Admit: 2017-02-17 | Discharge: 2017-02-17 | Disposition: A | Discharge status: Home / Self Care | Payer: Medicaid Other | Attending: Emergency Medicine | Admitting: Emergency Medicine

## 2017-02-17 ENCOUNTER — Other Ambulatory Visit: Payer: Self-pay

## 2017-02-17 DIAGNOSIS — H81399 Other peripheral vertigo, unspecified ear: Secondary | ICD-10-CM

## 2017-02-17 NOTE — ED Provider Notes (Signed)
[facility]-EMERGENCY DEPT Provider Note   CSN: [id number] Arrival date & time: 02/16/17  2154     History   Chief Complaint Chief Complaint  Patient presents with  . Dizziness    HPI KEYOSHA [name] is a 56 y.o. female.  HPI  56 year old female presents with acute dizziness. Started around 6 PM tonight. She states she was sitting on the couch watching TV when all of a sudden she started feeling lightheaded. She felt like she might pass out but also feels very similar to previous vertigo that she's had multiple times. She stood up but did not feel particularly worse. When she bent over start feeling more dizzy. Shortly after this she felt each heartbeat like it was pounding out of her chest but did not have chest pain or did not feel an irregular heartbeat or fast heartbeat. This lasted briefly. Since then she took an Antivert which she typically takes for dizziness. It did improve her symptoms but she still feels somewhat dizzy. When sitting at rest she does not feel the symptoms but if she stands up or bends over she feels a little dizzy. There is no headache, blurry vision, neck pain, slurred speech, or weakness/numbness. Over the last several days she feels like she's been having some issues with memory but none of this has been worse since her symptoms tonight. No chest pain or shortness of breath. No nausea or vomiting. No ear symptoms.  Also notes that a couple days ago she noticed blood when wiping after a bowel movement. Has not noticed this for over 24 hours. She states she has had hemorrhoids before things might be from this. She chronically has diarrhea. There has been no blood in the stool or toilet bowl.  Past Medical History:  Diagnosis Date  . Anxiety   . Arthritis   . Chronic systolic heart failure (HCC)    EF 20-25% ECHO 05/2015  . COPD (chronic obstructive pulmonary disease) (HCC)   . Depression   . Diabetes mellitus without complication (HCC)    Type II  . Dysrhythmia    pt unsure of name of arrythmia - " my heart rate will drop all of a sudden" -no current treatment - atrial flutter  . Fibrillation, atrial (HCC)   . Gallstones   . GERD (gastroesophageal reflux disease)   . Headache(784.0)    migraines  . Hypertension   . Osteomyelitis (HCC)    R ankle  . Rash    arms    Patient Active Problem List   Diagnosis Date Noted  . 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 Primary osteoarthritis of both hands 10/06/2016  . History of gastroesophageal reflux (GERD) 10/06/2016  . History of COPD 10/06/2016  . History of anxiety 10/06/2016  . History of CHF (congestive heart failure) 10/06/2016  . History of hypertension 10/06/2016  . History of cardiac dysrhythmia 10/06/2016  . Primary osteoarthritis of both feet 10/06/2016  . Primary osteoarthritis of both knees 10/06/2016  . History of infection/ ankle post operative  10/06/2016  . Rheumatoid factor positive 10/06/2016  . De Quervain's disease (radial styloid tenosynovitis) 06/09/2016  . Tobacco abuse 05/23/2016  . Hyperglycemia without ketosis   . Bleeding hemorrhoids 11/03/2015  . Acute blood loss anemia 11/03/2015  . Hematochezia 11/01/2015  . Back pain with sciatica 09/08/2015  . Infection of bone of ankle (HCC) 07/15/2015  . Chronic systolic CHF (congestive heart failure) (HCC) 06/24/2015  . Chest pain 06/03/2015  . Hypertension   . Anxiety   .  Gallstones   . GERD (gastroesophageal reflux disease)   . COPD (chronic obstructive pulmonary disease) (HCC)   . Dysrhythmia   . Rash   . Depression   . Diabetes mellitus without complication (HCC)   . Post-operative state 12/10/2011    Past Surgical History:  Procedure Laterality Date  . ANKLE FRACTURE SURGERY Right 2015  . CARDIAC CATHETERIZATION N/A 06/24/2015   Procedure: Left Heart Cath and Coronary Angiography;  Surgeon: [name], MD;  Location: [facility] INVASIVE CV LAB;  Service: Cardiovascular;  Laterality: N/A;  . CARPAL TUNNEL RELEASE     bil    . CHOLECYSTECTOMY  11/24/2011   Procedure: LAPAROSCOPIC CHOLECYSTECTOMY WITH INTRAOPERATIVE CHOLANGIOGRAM;  Surgeon: Clovis Pu. Cornett, MD;  Location: [facility] ORS;  Service: General;  Laterality: N/A;  Laparoscopic Cholecystectomy with Cholangiogram  . COLONOSCOPY    . ECTOPIC PREGNANCY SURGERY  many yrs ago  . HARDWARE REMOVAL Right 07/15/2015   Procedure: RIGHT ANKLE HARDWARE REMOVAL, PLACEMENT OF STIMULAN ANTIBIOTIC BEADS AND PREVENA WOUND VAC.;  Surgeon: [name], MD;  Location: [facility] OR;  Service: Orthopedics;  Laterality: Right;  . TOOTH EXTRACTION N/A 12/03/2016   Procedure: DENTAL EXTRACTIONS with Alveoloplasty;  Surgeon: Ocie Doyne, DDS;  Location: [facility] OR;  Service: Oral Surgery;  Laterality: N/A;    OB History    Gravida Para Term Preterm AB Living   9       5 4    SAB TAB Ectopic Multiple Live Births   4   1   4        Home Medications    Prior to Admission medications   Medication Sig Start Date End Date Taking? Authorizing Provider  albuterol (PROVENTIL HFA;VENTOLIN HFA) 108 (90 Base) MCG/ACT inhaler Inhale 2 puffs into the lungs every 6 (six) hours as needed for wheezing or shortness of breath. For wheezing 11/24/16   Jaclyn Shaggy, MD  aspirin 81 MG EC tablet Take 1 tablet (81 mg total) by mouth daily. Patient not taking: Reported on 12/16/2016 05/25/16   Nyra Market, MD  Blood Glucose Monitoring Suppl (ACCU-CHEK AVIVA) device Use as instructed daily. Patient taking differently: 1 each by Other route 3 (three) times daily.  11/24/16   Jaclyn Shaggy, MD  dicyclomine (BENTYL) 10 MG capsule Take 1 capsule (10 mg total) by mouth 3 (three) times daily before meals. 11/24/16   Jaclyn Shaggy, MD  DIOVAN 160 MG tablet Take 1 tablet (160 mg total) by mouth daily. 12/13/16   Jaclyn Shaggy, MD  esomeprazole (NEXIUM) 20 MG capsule Take 1 capsule (20 mg total) by mouth daily. 11/24/16   Jaclyn Shaggy, MD  FLUoxetine (PROZAC) 40 MG capsule Take 1 capsule (40 mg total) by mouth at  bedtime. 11/24/16   Jaclyn Shaggy, MD  glimepiride (AMARYL) 4 MG tablet Take 2 tablets (8 mg total) by mouth daily with breakfast. 11/24/16   Jaclyn Shaggy, MD  glucose blood (ACCU-CHEK AVIVA) test strip Use as instructed daily Patient taking differently: 1 each by Other route 3 (three) times daily.  11/24/16   Jaclyn Shaggy, MD  hydrOXYzine (ATARAX/VISTARIL) 10 MG tablet Take 1 tablet (10 mg total) by mouth daily. 11/24/16   Jaclyn Shaggy, MD  Lancet Devices [facility]) lancets Use as instructed daily. Patient taking differently: 1 each by Other route 3 (three) times daily.  11/24/16   Jaclyn Shaggy, MD  loperamide (IMODIUM) 2 MG capsule Take 4 mg by mouth 2 (two) times daily.    [provider]  meclizine (ANTIVERT) 25  MG tablet Take 1 tablet (25 mg total) by mouth 3 (three) times daily as needed for dizziness. 11/24/16   Jaclyn Shaggy, MD  metFORMIN (GLUCOPHAGE) 500 MG tablet Take 1 tablet (500 mg total) by mouth daily with breakfast. 11/24/16   Jaclyn Shaggy, MD  metoprolol succinate (TOPROL-XL) 25 MG 24 hr tablet Take 1 tablet (25 mg total) by mouth daily. 01/21/17   Jaclyn Shaggy, MD  oxyCODONE-acetaminophen (PERCOCET/ROXICET) 5-325 MG tablet Take 1 tablet by mouth every 12 (twelve) hours as needed for severe pain. 02/14/17   Adonis Huguenin, NP  pravastatin (PRAVACHOL) 40 MG tablet Take 1 tablet (40 mg total) by mouth daily. 11/24/16   Jaclyn Shaggy, MD  tiotropium (SPIRIVA) 18 MCG inhalation capsule Place 1 capsule (18 mcg total) into inhaler and inhale daily. 11/24/16   Jaclyn Shaggy, MD    Family History Family History  Problem Relation Age of Onset  . Lung cancer Father   . Heart attack Father 4       CABG x4  . Breast cancer Sister        Survivor     Social History Social History  Substance Use Topics  . Smoking status: Light Tobacco Smoker    Packs/day: 0.00    Years: 20.00    Types: Cigarettes, E-cigarettes    Start date: 09/19/2016  . Smokeless tobacco: Never  Used  . Alcohol use No     Allergies   Biaxin [clarithromycin]; Clarithromycin; Lisinopril; Sulfa antibiotics; Chlorthalidone; Daptomycin; Latex; Amoxicillin-pot clavulanate; Aspirin; Cholestyramine; Dilaudid [hydromorphone hcl]; and Lasix [furosemide]   Review of Systems Review of Systems  Constitutional: Negative for fever.  Eyes: Negative for visual disturbance.  Respiratory: Negative for shortness of breath.   Cardiovascular: Negative for chest pain.  Gastrointestinal: Negative for abdominal pain and vomiting.  Neurological: Positive for dizziness. Negative for syncope, weakness, numbness and headaches.  All other systems reviewed and are negative.    Physical Exam Updated Vital Signs BP (!) 132/96   Pulse 77   Temp 97.9 F (36.6 C)   Resp 17   Ht 5\' 7"  (1.702 m)   Wt 70.8 kg (156 lb)   LMP 07/24/2011   SpO2 95%   BMI 24.43 kg/m   Physical Exam  Constitutional: She is oriented to person, place, and time. She appears well-developed and well-nourished.  HENT:  Head: Normocephalic and atraumatic.  Right Ear: Tympanic membrane, external ear and ear canal normal.  Left Ear: Tympanic membrane, external ear and ear canal normal.  Nose: Nose normal.  Eyes: Pupils are equal, round, and reactive to light. EOM are normal. Right eye exhibits no discharge. Left eye exhibits no discharge.  Neck: Normal range of motion. Neck supple.  Cardiovascular: Normal rate, regular rhythm and normal heart sounds.   Pulmonary/Chest: Effort normal and breath sounds normal.  Abdominal: Soft. There is no tenderness.  Neurological: She is alert and oriented to person, place, and time.  CN 3-12 grossly intact. 5/5 strength in all 4 extremities. Grossly normal sensation. Normal finger to nose. Normal gait.  Skin: Skin is warm and dry.  Nursing note and vitals reviewed.    ED Treatments / Results  Labs (all labs ordered are listed, but only abnormal results are displayed) Labs Reviewed    BASIC METABOLIC PANEL - Abnormal; Notable for the following:       Result Value   CO2 21 (*)    Glucose, Bld 201 (*)    All other components within normal limits  CBC - Abnormal; Notable for the following:    RBC 5.38 (*)    Hemoglobin 15.7 (*)    All other components within normal limits  TROPONIN I    EKG  EKG Interpretation  Date/Time:  Thursday February 17 2017 00:24:36 EDT Ventricular Rate:  64 PR Interval:    QRS Duration: 91 QT Interval:  426 QTC Calculation: 440 R Axis:   31 Text Interpretation:  Sinus rhythm Ventricular premature complex Anteroseptal infarct, old Repol abnrm, global ischemia, diffuse leads ST/T changes similar to Nov 2017 Confirmed by Pricilla Loveless [phone]) on 02/17/2017 1:11:56 AM       Radiology Dg Chest 2 View  Result Date: 02/16/2017 CLINICAL DATA:  Chest pain. Chest tightness. Palpitations and dizziness this evening. EXAM: CHEST  2 VIEW COMPARISON:  Radiographs 05/23/2016.  Most recent CT 09/19/2013 FINDINGS: The cardiomediastinal contours are normal. Mild bronchial thickening. Pulmonary vasculature is normal. No consolidation, pleural effusion, or pneumothorax. No acute osseous abnormalities are seen. IMPRESSION: Mild bronchial thickening.  Otherwise unremarkable. Electronically Signed   By: Rubye Oaks M.D.   On: 02/16/2017 22:24    Procedures Procedures (including critical care time)  Medications Ordered in ED Medications - No data to display   Initial Impression / Assessment and Plan / ED Course  I have reviewed the triage vital signs and the nursing notes.  Pertinent labs & imaging results that were available during my care of the patient were reviewed by me and considered in my medical decision making (see chart for details).     While patient probably describes her symptoms as lightheadedness, I believe her dizziness is coming from vertigo. It is very positional and brief only with changes. Does not have symptoms at rest. No  other signs or symptoms of suggest stroke. I do not think this is near syncope and thinks she can be discharged with continued Antivert at home. She states it did not fully relieve her symptoms like typical but she basically has minimal to no symptoms now. I ambulated her without difficulty and she did not have any significant dizziness with this. She defers rectal exam at this time and with no bleeding in over 24 hours and only minimal blood when wiping I think this is probably more local trauma versus hemorrhoid. Discussed need for PCP follow-up and return precautions.  Final Clinical Impressions(s) / ED Diagnoses   Final diagnoses:  Peripheral vertigo, unspecified laterality    New Prescriptions New Prescriptions   No medications on file     Pricilla Loveless, MD 02/17/17 [phone]

## 2017-02-17 NOTE — ED Notes (Signed)
Pt departed in NAD, refused use of wheelchair.  

## 2017-02-17 NOTE — ED Notes (Signed)
Pt states that she took her vertigo medication and didn't help the dizziness. Along with she states she felt confused around 7pm last night and felt like her speech was different and that she was having difficulty speaking.

## 2017-02-21 ENCOUNTER — Other Ambulatory Visit: Payer: Self-pay | Admitting: Family Medicine

## 2017-02-21 DIAGNOSIS — H8113 Benign paroxysmal vertigo, bilateral: Secondary | ICD-10-CM

## 2017-03-02 ENCOUNTER — Encounter (INDEPENDENT_AMBULATORY_CARE_PROVIDER_SITE_OTHER): Payer: Self-pay | Admitting: Orthopedic Surgery

## 2017-03-02 ENCOUNTER — Ambulatory Visit (INDEPENDENT_AMBULATORY_CARE_PROVIDER_SITE_OTHER): Payer: Medicaid Other | Admitting: Orthopedic Surgery

## 2017-03-02 DIAGNOSIS — M654 Radial styloid tenosynovitis [de Quervain]: Secondary | ICD-10-CM

## 2017-03-02 MED ORDER — OXYCODONE-ACETAMINOPHEN 5-325 MG PO TABS: 1.0000 | ORAL_TABLET | Freq: Two times a day (BID) | ORAL | 0 refills | Status: DC | PRN

## 2017-03-02 NOTE — Progress Notes (Signed)
Post-Op Visit Note   Patient: [name]           Date of Birth: 1960/12/03           MRN: [id number] Visit Date: 03/02/2017 PCP: Ebony Shaggy, MD   Assessment & Plan:  Chief Complaint:  Chief Complaint  Patient presents with  . Post-op Follow-up    right thumb   Visit Diagnoses: No diagnosis found.  Plan: [name] is a 56 year old patient 5 weeks out de Quervain's release on the left.  Still has some occasional sharp shooting pain and numbness in the webspace.  On exam she has appropriate amount of swelling.  Wrist range of motion is improving.  Finkelstein's test negative.  Plan at this time is NSAID sample and refill oxycodone 1 only.  She'll need to transition to non-narcotic pain medicine after these run out.  Final check in 6 weeks.  She wants to hold off on formal therapy at this time.  We may want to try that if she still is having symptoms after her next clinic visit  Follow-Up Instructions: Return in about 6 weeks (around 04/13/2017).   Orders:  No orders of the defined types were placed in this encounter.  Meds ordered this encounter  Medications  . oxyCODONE-acetaminophen (PERCOCET/ROXICET) 5-325 MG tablet    Sig: Take 1 tablet by mouth every 12 (twelve) hours as needed for severe pain.    Dispense:  60 tablet    Refill:  0    Imaging: No results found.  PMFS History: Patient Active Problem List   Diagnosis Date Noted  . 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 Primary osteoarthritis of both hands 10/06/2016  . History of gastroesophageal reflux (GERD) 10/06/2016  . History of COPD 10/06/2016  . History of anxiety 10/06/2016  . History of CHF (congestive heart failure) 10/06/2016  . History of hypertension 10/06/2016  . History of cardiac dysrhythmia 10/06/2016  . Primary osteoarthritis of both feet 10/06/2016  . Primary osteoarthritis of both knees 10/06/2016  . History of infection/ ankle post operative  10/06/2016  . Rheumatoid factor positive 10/06/2016    . De Quervain's disease (radial styloid tenosynovitis) 06/09/2016  . Tobacco abuse 05/23/2016  . Hyperglycemia without ketosis   . Bleeding hemorrhoids 11/03/2015  . Acute blood loss anemia 11/03/2015  . Hematochezia 11/01/2015  . Back pain with sciatica 09/08/2015  . Infection of bone of ankle (HCC) 07/15/2015  . Chronic systolic CHF (congestive heart failure) (HCC) 06/24/2015  . Chest pain 06/03/2015  . Hypertension   . Anxiety   . Gallstones   . GERD (gastroesophageal reflux disease)   . COPD (chronic obstructive pulmonary disease) (HCC)   . Dysrhythmia   . Rash   . Depression   . Diabetes mellitus without complication (HCC)   . Post-operative state 12/10/2011   Past Medical History:  Diagnosis Date  . Anxiety   . Arthritis   . Chronic systolic heart failure (HCC)    EF 20-25% ECHO 05/2015  . COPD (chronic obstructive pulmonary disease) (HCC)   . Depression   . Diabetes mellitus without complication (HCC)    Type II  . Dysrhythmia    pt unsure of name of arrythmia - " my heart rate will drop all of a sudden" -no current treatment - atrial flutter  . Fibrillation, atrial (HCC)   . Gallstones   . GERD (gastroesophageal reflux disease)   . Headache(784.0)    migraines  . Hypertension   . Osteomyelitis (HCC)  R ankle  . Rash    arms    Family History  Problem Relation Age of Onset  . Lung cancer Father   . Heart attack Father 71       CABG x4  . Breast cancer Sister        Survivor     Past Surgical History:  Procedure Laterality Date  . ANKLE FRACTURE SURGERY Right 2015  . CARDIAC CATHETERIZATION N/A 06/24/2015   Procedure: Left Heart Cath and Coronary Angiography;  Surgeon: [name], MD;  Location: [facility] INVASIVE CV LAB;  Service: Cardiovascular;  Laterality: N/A;  . CARPAL TUNNEL RELEASE     bil  . CHOLECYSTECTOMY  11/24/2011   Procedure: LAPAROSCOPIC CHOLECYSTECTOMY WITH INTRAOPERATIVE CHOLANGIOGRAM;  Surgeon: Clovis Pu. Cornett, MD;  Location: [facility]  ORS;  Service: General;  Laterality: N/A;  Laparoscopic Cholecystectomy with Cholangiogram  . COLONOSCOPY    . ECTOPIC PREGNANCY SURGERY  many yrs ago  . HARDWARE REMOVAL Right 07/15/2015   Procedure: RIGHT ANKLE HARDWARE REMOVAL, PLACEMENT OF STIMULAN ANTIBIOTIC BEADS AND PREVENA WOUND VAC.;  Surgeon: [name], MD;  Location: [facility] OR;  Service: Orthopedics;  Laterality: Right;  . TOOTH EXTRACTION N/A 12/03/2016   Procedure: DENTAL EXTRACTIONS with Alveoloplasty;  Surgeon: Ocie Doyne, DDS;  Location: [facility] OR;  Service: Oral Surgery;  Laterality: N/A;   Social History   Occupational History  . Not on file.   Social History Main Topics  . Smoking status: Light Tobacco Smoker    Packs/day: 0.00    Years: 20.00    Types: Cigarettes, E-cigarettes    Start date: 09/19/2016  . Smokeless tobacco: Never Used  . Alcohol use No  . Drug use: No  . Sexual activity: Not Currently    Birth control/ protection: Post-menopausal

## 2017-03-29 ENCOUNTER — Ambulatory Visit: Payer: Medicaid Other | Attending: Family Medicine | Admitting: Family Medicine

## 2017-03-29 ENCOUNTER — Encounter: Payer: Self-pay | Admitting: Family Medicine

## 2017-03-29 VITALS — BP 137/76 | HR 62 | Temp 98.1°F | Ht 67.0 in | Wt 160.0 lb

## 2017-03-29 DIAGNOSIS — Z886 Allergy status to analgesic agent status: Secondary | ICD-10-CM | POA: Diagnosis not present

## 2017-03-29 DIAGNOSIS — J449 Chronic obstructive pulmonary disease, unspecified: Secondary | ICD-10-CM | POA: Insufficient documentation

## 2017-03-29 DIAGNOSIS — L299 Pruritus, unspecified: Secondary | ICD-10-CM | POA: Diagnosis not present

## 2017-03-29 DIAGNOSIS — K529 Noninfective gastroenteritis and colitis, unspecified: Secondary | ICD-10-CM | POA: Insufficient documentation

## 2017-03-29 DIAGNOSIS — H8110 Benign paroxysmal vertigo, unspecified ear: Secondary | ICD-10-CM | POA: Insufficient documentation

## 2017-03-29 DIAGNOSIS — I5022 Chronic systolic (congestive) heart failure: Secondary | ICD-10-CM | POA: Insufficient documentation

## 2017-03-29 DIAGNOSIS — Z880 Allergy status to penicillin: Secondary | ICD-10-CM | POA: Diagnosis not present

## 2017-03-29 DIAGNOSIS — E1165 Type 2 diabetes mellitus with hyperglycemia: Secondary | ICD-10-CM | POA: Diagnosis not present

## 2017-03-29 DIAGNOSIS — Z8719 Personal history of other diseases of the digestive system: Secondary | ICD-10-CM

## 2017-03-29 DIAGNOSIS — I110 Hypertensive heart disease with heart failure: Secondary | ICD-10-CM | POA: Insufficient documentation

## 2017-03-29 DIAGNOSIS — K219 Gastro-esophageal reflux disease without esophagitis: Secondary | ICD-10-CM | POA: Insufficient documentation

## 2017-03-29 DIAGNOSIS — J410 Simple chronic bronchitis: Secondary | ICD-10-CM | POA: Diagnosis not present

## 2017-03-29 DIAGNOSIS — I4891 Unspecified atrial fibrillation: Secondary | ICD-10-CM | POA: Diagnosis not present

## 2017-03-29 DIAGNOSIS — Z79899 Other long term (current) drug therapy: Secondary | ICD-10-CM | POA: Diagnosis not present

## 2017-03-29 DIAGNOSIS — H8113 Benign paroxysmal vertigo, bilateral: Secondary | ICD-10-CM | POA: Diagnosis not present

## 2017-03-29 DIAGNOSIS — K591 Functional diarrhea: Secondary | ICD-10-CM | POA: Diagnosis not present

## 2017-03-29 DIAGNOSIS — F329 Major depressive disorder, single episode, unspecified: Secondary | ICD-10-CM | POA: Insufficient documentation

## 2017-03-29 DIAGNOSIS — E119 Type 2 diabetes mellitus without complications: Secondary | ICD-10-CM

## 2017-03-29 DIAGNOSIS — Z7982 Long term (current) use of aspirin: Secondary | ICD-10-CM | POA: Diagnosis not present

## 2017-03-29 DIAGNOSIS — Z888 Allergy status to other drugs, medicaments and biological substances status: Secondary | ICD-10-CM | POA: Diagnosis not present

## 2017-03-29 DIAGNOSIS — I10 Essential (primary) hypertension: Secondary | ICD-10-CM

## 2017-03-29 DIAGNOSIS — F419 Anxiety disorder, unspecified: Secondary | ICD-10-CM | POA: Insufficient documentation

## 2017-03-29 DIAGNOSIS — Z794 Long term (current) use of insulin: Secondary | ICD-10-CM | POA: Diagnosis not present

## 2017-03-29 DIAGNOSIS — E113559 Type 2 diabetes mellitus with stable proliferative diabetic retinopathy, unspecified eye: Secondary | ICD-10-CM | POA: Insufficient documentation

## 2017-03-29 LAB — GLUCOSE, POCT (MANUAL RESULT ENTRY): POC Glucose: 249 mg/dl — AB (ref 70–99)

## 2017-03-29 LAB — POCT GLYCOSYLATED HEMOGLOBIN (HGB A1C): Hemoglobin A1C: 9

## 2017-03-29 MED ORDER — ALBUTEROL SULFATE HFA 108 (90 BASE) MCG/ACT IN AERS: 2.0000 | INHALATION_SPRAY | Freq: Four times a day (QID) | RESPIRATORY_TRACT | 3 refills | Status: DC | PRN

## 2017-03-29 MED ORDER — PRAVASTATIN SODIUM 40 MG PO TABS
40.0000 mg | ORAL_TABLET | Freq: Every day | ORAL | 5 refills | Status: DC
Start: 2017-03-29 — End: 2017-10-18

## 2017-03-29 MED ORDER — TIOTROPIUM BROMIDE MONOHYDRATE 18 MCG IN CAPS: 18.0000 ug | ORAL_CAPSULE | Freq: Every day | RESPIRATORY_TRACT | 5 refills | Status: DC

## 2017-03-29 MED ORDER — FLUOXETINE HCL 40 MG PO CAPS: 40.0000 mg | ORAL_CAPSULE | Freq: Every day | ORAL | 5 refills | Status: DC

## 2017-03-29 MED ORDER — GLUCOSE BLOOD VI STRP: 1.0000 | ORAL_STRIP | Freq: Three times a day (TID) | 5 refills | Status: DC

## 2017-03-29 MED ORDER — INSULIN PEN NEEDLE 31G X 5 MM MISC: 1.0000 | Freq: Every day | 5 refills | Status: DC

## 2017-03-29 MED ORDER — INSULIN GLARGINE 100 UNIT/ML SOLOSTAR PEN: 10.0000 [IU] | PEN_INJECTOR | Freq: Every day | SUBCUTANEOUS | 5 refills | Status: DC

## 2017-03-29 MED ORDER — MECLIZINE HCL 25 MG PO TABS: 25.0000 mg | ORAL_TABLET | Freq: Three times a day (TID) | ORAL | 3 refills | Status: DC | PRN

## 2017-03-29 MED ORDER — GLIMEPIRIDE 4 MG PO TABS: 8.0000 mg | ORAL_TABLET | Freq: Every day | ORAL | 5 refills | Status: DC

## 2017-03-29 MED ORDER — METOPROLOL SUCCINATE ER 25 MG PO TB24: 25.0000 mg | ORAL_TABLET | Freq: Every day | ORAL | 5 refills | Status: DC

## 2017-03-29 MED ORDER — ESOMEPRAZOLE MAGNESIUM 20 MG PO CPDR: 20.0000 mg | DELAYED_RELEASE_CAPSULE | Freq: Every day | ORAL | 5 refills | Status: DC

## 2017-03-29 MED ORDER — HYDROXYZINE HCL 10 MG PO TABS: 10.0000 mg | ORAL_TABLET | Freq: Every day | ORAL | 5 refills | Status: DC

## 2017-03-29 NOTE — Progress Notes (Signed)
Subjective:  Patient ID: [name], female    DOB: 12/30/1960  Age: 56 y.o. MRN: [id number]  CC: Diabetes   HPI [name] is a 56 year old female with a history of type 2 diabetes mellitus (A1c 9.0 which is improved from 11.3), hypertension, CHF (EF 55-60%  2-D echo of 09/2016 which has improved from 25%-30% previously), anxiety, GERD who presents today for follow-up visit.  With regards to her diabetes she endorses compliance with all her medications at denies hypoglycemia. She denies numbness in extremities. She has been compliant with her antihypertensive and statin and denies side effects from medications. Reflux symptoms have been controlled.  She complains of chronic diarrhea which has not improved on Bentyl and she has to take 3-4 Imodium tablets. This is a source of anxiety for her as it has affected her sexual life and also prevents her from going out or eating as she is afraid she would use the bathroom every time she eats.  She will need a referral to a retina specialist at [facility] as advised by her current ophthalmologist Dr. Noel Gerold with [facility].  She has had a cough for the last 1 month which is described as productive with whitish sputum but no fever, myalgia and she has not had any sinus tenderness or nasal congestion.  Past Medical History:  Diagnosis Date  . Anxiety   . Arthritis   . Chronic systolic heart failure (HCC)    EF 20-25% ECHO 05/2015  . COPD (chronic obstructive pulmonary disease) (HCC)   . Depression   . Diabetes mellitus without complication (HCC)    Type II  . Dysrhythmia    pt unsure of name of arrythmia - " my heart rate will drop all of a sudden" -no current treatment - atrial flutter  . Fibrillation, atrial (HCC)   . Gallstones   . GERD (gastroesophageal reflux disease)   . Headache(784.0)    migraines  . Hypertension   . Osteomyelitis (HCC)    R ankle  . Rash    arms    Past Surgical History:    Procedure Laterality Date  . ANKLE FRACTURE SURGERY Right 2015  . CARDIAC CATHETERIZATION N/A 06/24/2015   Procedure: Left Heart Cath and Coronary Angiography;  Surgeon: [name], MD;  Location: [facility] INVASIVE CV LAB;  Service: Cardiovascular;  Laterality: N/A;  . CARPAL TUNNEL RELEASE     bil  . CHOLECYSTECTOMY  11/24/2011   Procedure: LAPAROSCOPIC CHOLECYSTECTOMY WITH INTRAOPERATIVE CHOLANGIOGRAM;  Surgeon: Clovis Pu. Cornett, MD;  Location: [facility] ORS;  Service: General;  Laterality: N/A;  Laparoscopic Cholecystectomy with Cholangiogram  . COLONOSCOPY    . ECTOPIC PREGNANCY SURGERY  many yrs ago  . HARDWARE REMOVAL Right 07/15/2015   Procedure: RIGHT ANKLE HARDWARE REMOVAL, PLACEMENT OF STIMULAN ANTIBIOTIC BEADS AND PREVENA WOUND VAC.;  Surgeon: [name], MD;  Location: [facility] OR;  Service: Orthopedics;  Laterality: Right;  . TOOTH EXTRACTION N/A 12/03/2016   Procedure: DENTAL EXTRACTIONS with Alveoloplasty;  Surgeon: Ocie Doyne, DDS;  Location: [facility] OR;  Service: Oral Surgery;  Laterality: N/A;    Allergies  Allergen Reactions  . Biaxin [Clarithromycin] Anaphylaxis and Swelling  . Clarithromycin Shortness Of Breath and Swelling  . Lisinopril Swelling and Cough    Lip edema  . Sulfa Antibiotics Anaphylaxis, Shortness Of Breath, Swelling and Hypertension  . Chlorthalidone Nausea And Vomiting and Other (See Comments)    Clammy, Tachycardia, Headache   . Daptomycin Nausea And  Vomiting  . Latex Hives and Rash  . Amoxicillin-Pot Clavulanate Diarrhea    Has patient had a PCN reaction causing immediate rash, facial/tongue/throat swelling, SOB or lightheadedness with hypotension:No Has patient had a PCN reaction causing severe rash involving mucus membranes or skin necrosis:No Has patient had a PCN reaction that required hospitalization:No Has patient had a PCN reaction occurring within THE LAST 10 YEARS.  #  #  #  YES  #  #  #  If all of the above answers are "NO", then may proceed  with Cephalosporin use.   . Aspirin Nausea And Vomiting and Rash    On  dosage  . Cholestyramine Nausea And Vomiting  . Dilaudid [Hydromorphone Hcl] Nausea And Vomiting  . Lasix [Furosemide] Nausea And Vomiting and Other (See Comments)    Headache       Outpatient Medications Prior to Visit  Medication Sig Dispense Refill  . aspirin 81 MG EC tablet Take 1 tablet (81 mg total) by mouth daily. 30 tablet 2  . Blood Glucose Monitoring Suppl (ACCU-CHEK AVIVA) device Use as instructed daily. (Patient taking differently: 1 each by Other route 3 (three) times daily. ) 1 each 0  . dicyclomine (BENTYL) 10 MG capsule Take 1 capsule (10 mg total) by mouth 3 (three) times daily before meals. 90 capsule 5  . Lancet Devices (ACCU-CHEK SOFTCLIX) lancets Use as instructed daily. (Patient taking differently: 1 each by Other route 3 (three) times daily. ) 1 each 5  . loperamide (IMODIUM) 2 MG capsule Take 4 mg by mouth 2 (two) times daily.    Marland Kitchen oxyCODONE-acetaminophen (PERCOCET/ROXICET) 5-325 MG tablet Take 1 tablet by mouth every 12 (twelve) hours as needed for severe pain. 60 tablet 0  . albuterol (PROVENTIL HFA;VENTOLIN HFA) 108 (90 Base) MCG/ACT inhaler Inhale 2 puffs into the lungs every 6 (six) hours as needed for wheezing or shortness of breath. For wheezing 1 Inhaler 3  . esomeprazole (NEXIUM) 20 MG capsule Take 1 capsule (20 mg total) by mouth daily. 30 capsule 5  . FLUoxetine (PROZAC) 40 MG capsule Take 1 capsule (40 mg total) by mouth at bedtime. 30 capsule 5  . glimepiride (AMARYL) 4 MG tablet Take 2 tablets (8 mg total) by mouth daily with breakfast. 60 tablet 5  . glucose blood (ACCU-CHEK AVIVA) test strip Use as instructed daily (Patient taking differently: 1 each by Other route 3 (three) times daily. ) 100 each 12  . hydrOXYzine (ATARAX/VISTARIL) 10 MG tablet Take 1 tablet (10 mg total) by mouth daily. 30 tablet 5  . meclizine (ANTIVERT) 25 MG tablet Take 1 tablet (25 mg total) by mouth 3  (three) times daily as needed for dizziness. 60 tablet 3  . metFORMIN (GLUCOPHAGE) 500 MG tablet Take 1 tablet (500 mg total) by mouth daily with breakfast. 30 tablet 5  . metoprolol succinate (TOPROL-XL) 25 MG 24 hr tablet Take 1 tablet (25 mg total) by mouth daily. 30 tablet 5  . pravastatin (PRAVACHOL) 40 MG tablet Take 1 tablet (40 mg total) by mouth daily. 30 tablet 5  . tiotropium (SPIRIVA) 18 MCG inhalation capsule Place 1 capsule (18 mcg total) into inhaler and inhale daily. 30 capsule 5  . DIOVAN 160 MG tablet Take 1 tablet (160 mg total) by mouth daily. (Patient not taking: Reported on 03/29/2017) 30 tablet 5   No facility-administered medications prior to visit.     ROS Review of Systems  Constitutional: Negative for activity change, appetite change and fatigue.  HENT: Negative for congestion, sinus pressure and sore throat.   Eyes: Negative for visual disturbance.  Respiratory: Positive for cough. Negative for chest tightness, shortness of breath and wheezing.   Cardiovascular: Negative for chest pain and palpitations.  Gastrointestinal: Positive for diarrhea. Negative for abdominal distention, abdominal pain and constipation.  Endocrine: Negative for polydipsia.  Genitourinary: Negative for dysuria and frequency.  Musculoskeletal: Negative for arthralgias and back pain.  Skin: Negative for rash.  Neurological: Negative for tremors, light-headedness and numbness.  Hematological: Does not bruise/bleed easily.  Psychiatric/Behavioral: Negative for agitation and behavioral problems.    Objective:  BP 137/76   Pulse 62   Temp 98.1 F (36.7 C) (Oral)   Ht  (1.702 m)   Wt 160 lb (72.6 kg)   LMP 07/24/2011   SpO2 98%   BMI 25.06 kg/m   BP/Weight 03/29/2017 02/17/2017 02/16/2017  Systolic BP 137 140 -  Diastolic BP 76 88 -  Wt. (Lbs) 160 - 156  BMI 25.06 24.43 -      Physical Exam  Constitutional: She is oriented to person, place, and time. She appears  well-developed and well-nourished.  Cardiovascular: Normal rate, normal heart sounds and intact distal pulses.   No murmur heard. Pulmonary/Chest: Effort normal and breath sounds normal. She has no wheezes. She has no rales. She exhibits no tenderness.  Abdominal: Soft. Bowel sounds are normal. She exhibits no distension and no mass. There is no tenderness.  Musculoskeletal: Normal range of motion.  Neurological: She is alert and oriented to person, place, and time.  Skin: Skin is warm and dry.  Psychiatric: She has a normal mood and affect.     Lab Results  Component Value Date   HGBA1C 9.0 03/29/2017    Assessment & Plan:   1. Type 2 diabetes mellitus with hyperglycemia, with long-term current use of insulin (HCC) Uncontrolled with A1c of 9.0 Commenced on Lantus Diabetic diet, lifestyle modifications - POCT glucose (manual entry) - POCT glycosylated hemoglobin (Hb A1C) - glimepiride (AMARYL) 4 MG tablet; Take 2 tablets (8 mg total) by mouth daily with breakfast.  Dispense: 60 tablet; Refill: 5 - glucose blood (ACCU-CHEK AVIVA) test strip; 1 each by Other route 3 (three) times daily.  Dispense: 100 each; Refill: 5 - pravastatin (PRAVACHOL) 40 MG tablet; Take 1 tablet (40 mg total) by mouth daily.  Dispense: 30 tablet; Refill: 5 - Insulin Pen Needle 31G X 5 MM MISC; 1 each by Does not apply route at bedtime.  Dispense: 30 each; Refill: 5 - Insulin Glargine (LANTUS SOLOSTAR) 100 UNIT/ML Solostar Pen; Inject 10 Units into the skin daily at 10 pm.  Dispense: 3 pen; Refill: 5  2. Anxiety She has experienced some depression due to chronic diarrhea but is not in crisis at this time. - FLUoxetine (PROZAC) 40 MG capsule; Take 1 capsule (40 mg total) by mouth at bedtime.  Dispense: 30 capsule; Refill: 5  3. Pruritus Stable - hydrOXYzine (ATARAX/VISTARIL) 10 MG tablet; Take 1 tablet (10 mg total) by mouth daily.  Dispense: 30 tablet; Refill: 5  4. Simple chronic bronchitis (HCC) No  acute exacerbations Advised to use OTC Zyrtec for current cough - tiotropium (SPIRIVA) 18 MCG inhalation capsule; Place 1 capsule (18 mcg total) into inhaler and inhale daily.  Dispense: 30 capsule; Refill: 5 - albuterol (PROVENTIL HFA;VENTOLIN HFA) 108 (90 Base) MCG/ACT inhaler; Inhale 2 puffs into the lungs every 6 (six) hours as needed for wheezing or shortness of breath. For wheezing  Dispense:  1 Inhaler; Refill: 3  5. History of gastroesophageal reflux (GERD) Controlled - esomeprazole (NEXIUM) 20 MG capsule; Take 1 capsule (20 mg total) by mouth daily.  Dispense: 30 capsule; Refill: 5  6. Benign paroxysmal positional vertigo due to bilateral vestibular disorder Stable - meclizine (ANTIVERT) 25 MG tablet; Take 1 tablet (25 mg total) by mouth 3 (three) times daily as needed for dizziness.  Dispense: 60 tablet; Refill: 3  7. Functional diarrhea Unknown etiology; uncontrolled on Imodium and Bentyl. Discontinue metformin Patient to call the clinic in the event of symptoms persist so I can place referral to a GI  8. Stable proliferative diabetic retinopathy associated with type 2 diabetes mellitus, unspecified laterality (HCC) Currently followed by [facility] and referral to a retinal specialist recommended - Ambulatory referral to Ophthalmology  9. Essential hypertension Controlled - metoprolol succinate (TOPROL-XL) 25 MG 24 hr tablet; Take 1 tablet (25 mg total) by mouth daily.  Dispense: 30 tablet; Refill: 5   Meds ordered this encounter  Medications  . glimepiride (AMARYL) 4 MG tablet    Sig: Take 2 tablets (8 mg total) by mouth daily with breakfast.    Dispense:  60 tablet    Refill:  5    Must have office visit for refills  . glucose blood (ACCU-CHEK AVIVA) test strip    Sig: 1 each by Other route 3 (three) times daily.    Dispense:  100 each    Refill:  5  . FLUoxetine (PROZAC) 40 MG capsule    Sig: Take 1 capsule (40 mg total) by mouth at bedtime.    Dispense:  30  capsule    Refill:  5  . pravastatin (PRAVACHOL) 40 MG tablet    Sig: Take 1 tablet (40 mg total) by mouth daily.    Dispense:  30 tablet    Refill:  5  . hydrOXYzine (ATARAX/VISTARIL) 10 MG tablet    Sig: Take 1 tablet (10 mg total) by mouth daily.    Dispense:  30 tablet    Refill:  5  . tiotropium (SPIRIVA) 18 MCG inhalation capsule    Sig: Place 1 capsule (18 mcg total) into inhaler and inhale daily.    Dispense:  30 capsule    Refill:  5  . albuterol (PROVENTIL HFA;VENTOLIN HFA) 108 (90 Base) MCG/ACT inhaler    Sig: Inhale 2 puffs into the lungs every 6 (six) hours as needed for wheezing or shortness of breath. For wheezing    Dispense:  1 Inhaler    Refill:  3  . esomeprazole (NEXIUM) 20 MG capsule    Sig: Take 1 capsule (20 mg total) by mouth daily.    Dispense:  30 capsule    Refill:  5    Must have office visit for refills  . metoprolol succinate (TOPROL-XL) 25 MG 24 hr tablet    Sig: Take 1 tablet (25 mg total) by mouth daily.    Dispense:  30 tablet    Refill:  5  . meclizine (ANTIVERT) 25 MG tablet    Sig: Take 1 tablet (25 mg total) by mouth 3 (three) times daily as needed for dizziness.    Dispense:  60 tablet    Refill:  3  . Insulin Pen Needle 31G X 5 MM MISC    Sig: 1 each by Does not apply route at bedtime.    Dispense:  30 each    Refill:  5  . Insulin Glargine (LANTUS SOLOSTAR) 100 UNIT/ML  Solostar Pen    Sig: Inject 10 Units into the skin daily at 10 pm.    Dispense:  3 pen    Refill:  5    Discontinue metformin    Follow-up: Return in about 3 weeks (around 04/19/2017) for Complete Physical exam.   Jaclyn Shaggy MD

## 2017-04-15 ENCOUNTER — Ambulatory Visit (INDEPENDENT_AMBULATORY_CARE_PROVIDER_SITE_OTHER): Payer: Medicaid Other | Admitting: Orthopedic Surgery

## 2017-04-15 ENCOUNTER — Encounter (INDEPENDENT_AMBULATORY_CARE_PROVIDER_SITE_OTHER): Payer: Self-pay | Admitting: Orthopedic Surgery

## 2017-04-15 DIAGNOSIS — M654 Radial styloid tenosynovitis [de Quervain]: Secondary | ICD-10-CM

## 2017-04-15 NOTE — Progress Notes (Signed)
Post-Op Visit Note   Patient: [name]           Date of Birth: 1960-12-26           MRN: [id number] Visit Date: 04/15/2017 PCP: Jaclyn ShaggyAmao, Enobong, MD   Assessment & Plan:  Chief Complaint:  Chief Complaint  Patient presents with  . Follow-up   Visit Diagnoses:  1. De Quervain's tenosynovitis     Plan: Ebony SagoSarah is a patient who is 2 months out left wrist and thumb de Quervain's tenosynovitis first dorsal compartment release.  Numbness improving. No pain with ulnar deviation.  Was taking Percocet twice a day but I've encouraged her come off of that medica this problem Follow-up with me as needed.  Follow-Up Instructions: Return if symptoms worsen or fail to improve.   Orders:  No orders of the defined types were placed in this encounter.  No orders of the defined types were placed in this encounter.   Imaging: No results found.  PMFS History: Patient Active Problem List   Diagnosis Date Noted  . 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 Primary osteoarthritis of both hands 10/06/2016  . History of gastroesophageal reflux (GERD) 10/06/2016  . History of COPD 10/06/2016  . History of anxiety 10/06/2016  . History of CHF (congestive heart failure) 10/06/2016  . History of hypertension 10/06/2016  . History of cardiac dysrhythmia 10/06/2016  . Primary osteoarthritis of both feet 10/06/2016  . Primary osteoarthritis of both knees 10/06/2016  . History of infection/ ankle post operative  10/06/2016  . Rheumatoid factor positive 10/06/2016  . De Quervain's disease (radial styloid tenosynovitis) 06/09/2016  . Tobacco abuse 05/23/2016  . Hyperglycemia without ketosis   . Bleeding hemorrhoids 11/03/2015  . Acute blood loss anemia 11/03/2015  . Hematochezia 11/01/2015  . Back pain with sciatica 09/08/2015  . Infection of bone of ankle (HCC) 07/15/2015  . Chronic systolic CHF (congestive heart failure) (HCC) 06/24/2015  . Chest pain 06/03/2015  . Hypertension   . Anxiety   .  Gallstones   . GERD (gastroesophageal reflux disease)   . COPD (chronic obstructive pulmonary disease) (HCC)   . Dysrhythmia   . Rash   . Depression   . Diabetes mellitus without complication (HCC)   . Post-operative state 12/10/2011   Past Medical History:  Diagnosis Date  . Anxiety   . Arthritis   . Chronic systolic heart failure (HCC)    EF 20-25% ECHO 05/2015  . COPD (chronic obstructive pulmonary disease) (HCC)   . Depression   . Diabetes mellitus without complication (HCC)    Type II  . Dysrhythmia    pt unsure of name of arrythmia - " my heart rate will drop all of a sudden" -no current treatment - atrial flutter  . Fibrillation, atrial (HCC)   . Gallstones   . GERD (gastroesophageal reflux disease)   . Headache(784.0)    migraines  . Hypertension   . Osteomyelitis (HCC)    R ankle  . Rash    arms    Family History  Problem Relation Age of Onset  . Lung cancer Father   . Heart attack Father [id number]       CABG x4  . Breast cancer Sister        Survivor     Past Surgical History:  Procedure Laterality Date  . ANKLE FRACTURE SURGERY Right 2015  . CARDIAC CATHETERIZATION N/A 06/24/2015   Procedure: Left Heart Cath and Coronary Angiography;  Surgeon: Barry DienesHenry W  Katrinka Blazing, MD;  Location: [facility] INVASIVE CV LAB;  Service: Cardiovascular;  Laterality: N/A;  . CARPAL TUNNEL RELEASE     bil  . CHOLECYSTECTOMY  11/24/2011   Procedure: LAPAROSCOPIC CHOLECYSTECTOMY WITH INTRAOPERATIVE CHOLANGIOGRAM;  Surgeon: Clovis Pu. Cornett, MD;  Location: [facility] ORS;  Service: General;  Laterality: N/A;  Laparoscopic Cholecystectomy with Cholangiogram  . COLONOSCOPY    . ECTOPIC PREGNANCY SURGERY  many yrs ago  . HARDWARE REMOVAL Right 07/15/2015   Procedure: RIGHT ANKLE HARDWARE REMOVAL, PLACEMENT OF STIMULAN ANTIBIOTIC BEADS AND PREVENA WOUND VAC.;  Surgeon: [name], MD;  Location: [facility] OR;  Service: Orthopedics;  Laterality: Right;  . TOOTH EXTRACTION N/A 12/03/2016   Procedure: DENTAL  EXTRACTIONS with Alveoloplasty;  Surgeon: Ocie Doyne, DDS;  Location: [facility] OR;  Service: Oral Surgery;  Laterality: N/A;   Social History   Occupational History  . Not on file.   Social History Main Topics  . Smoking status: Light Tobacco Smoker    Packs/day: 0.00    Years: 20.00    Types: Cigarettes, E-cigarettes    Start date: 09/19/2016  . Smokeless tobacco: Never Used  . Alcohol use No  . Drug use: No  . Sexual activity: Not Currently    Birth control/ protection: Post-menopausal

## 2017-04-24 IMAGING — DX DG CHEST 2V
2 series · 2 of 2 positions shown · non-contrast
Comparison: 04/01/2016 and earlier.

CLINICAL DATA: 55-year-old female with left chest pain and cough.
Pain radiating to the back. Diarrhea and sinus congestion. Initial
encounter.

EXAM:
CHEST  2 VIEW

[w chest pa]
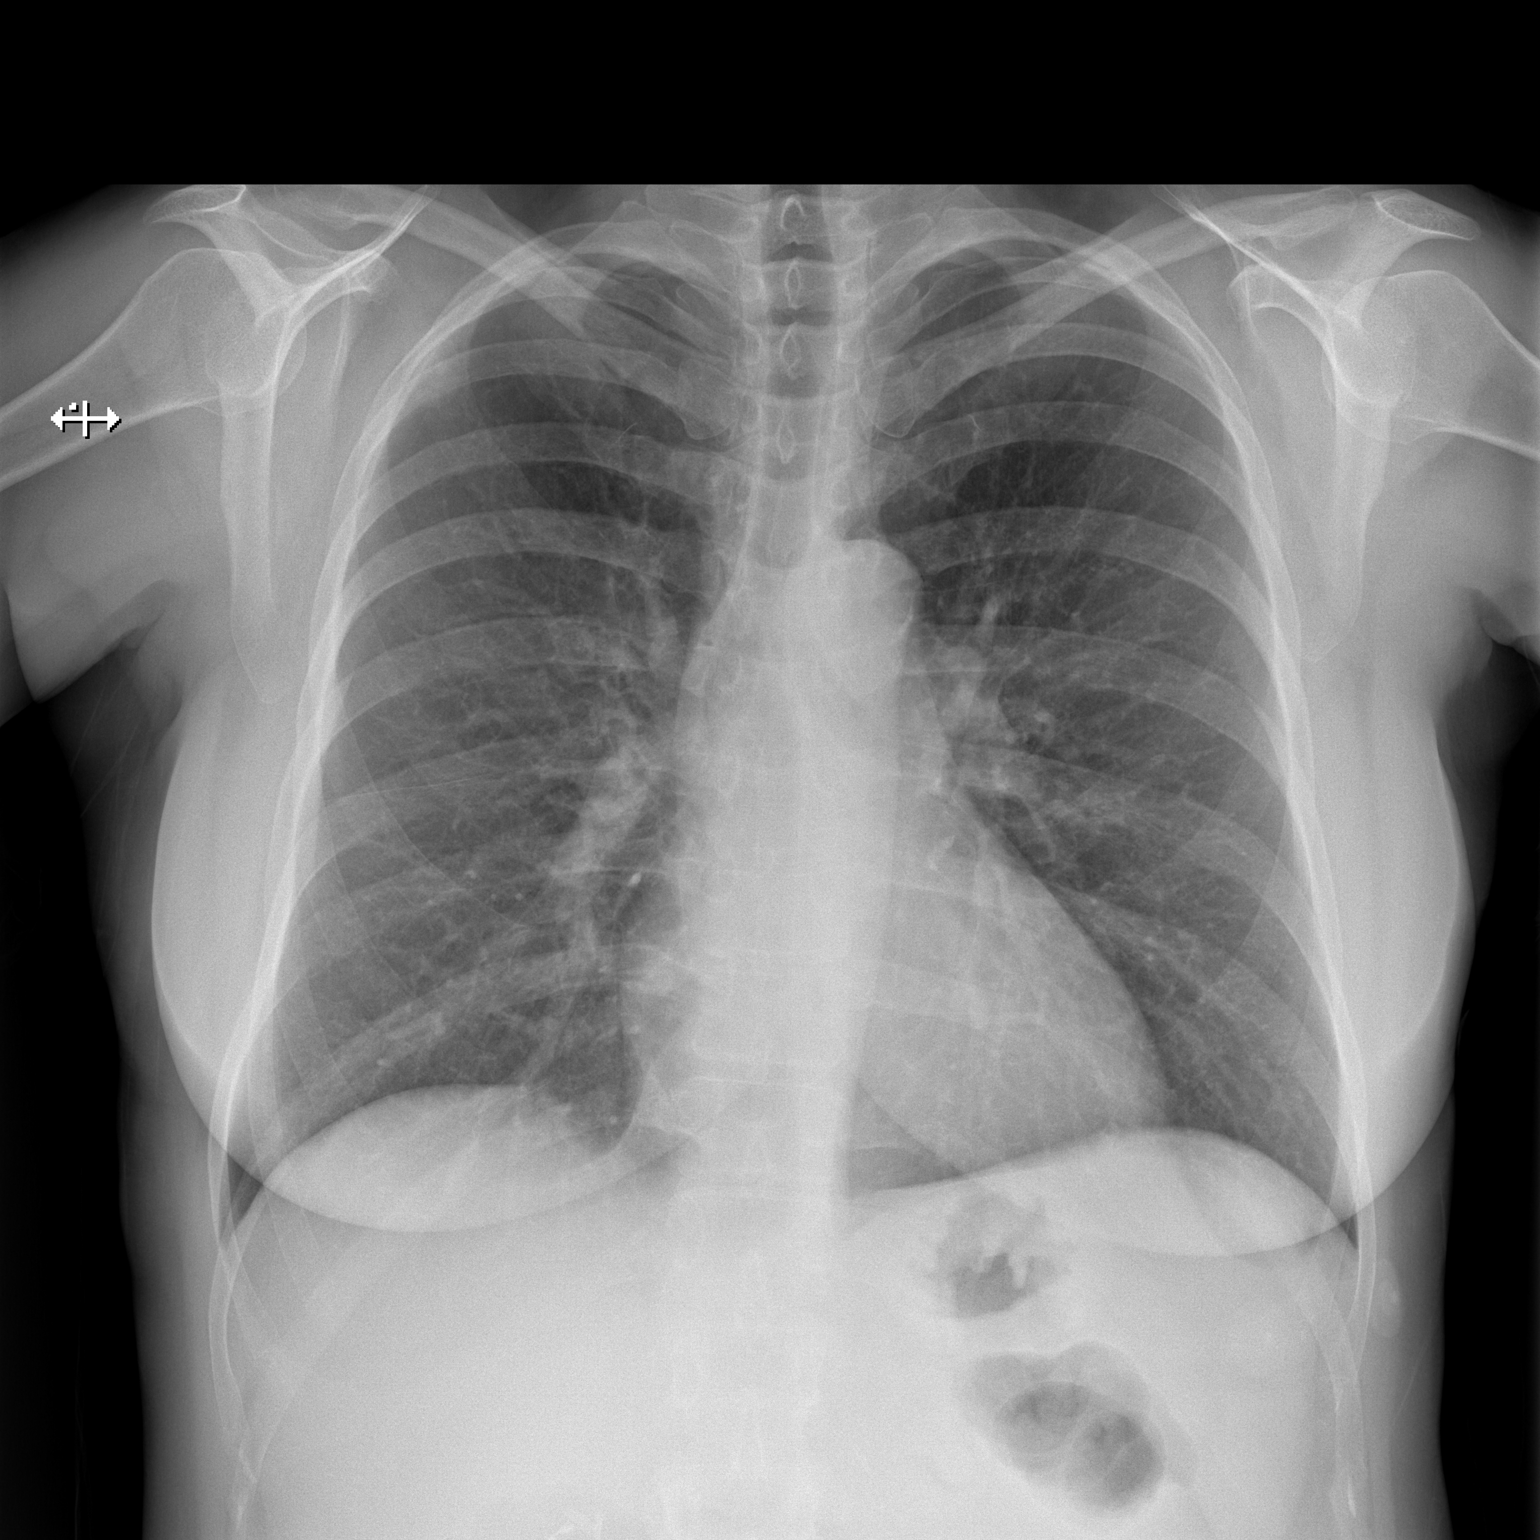

[w chest lat]
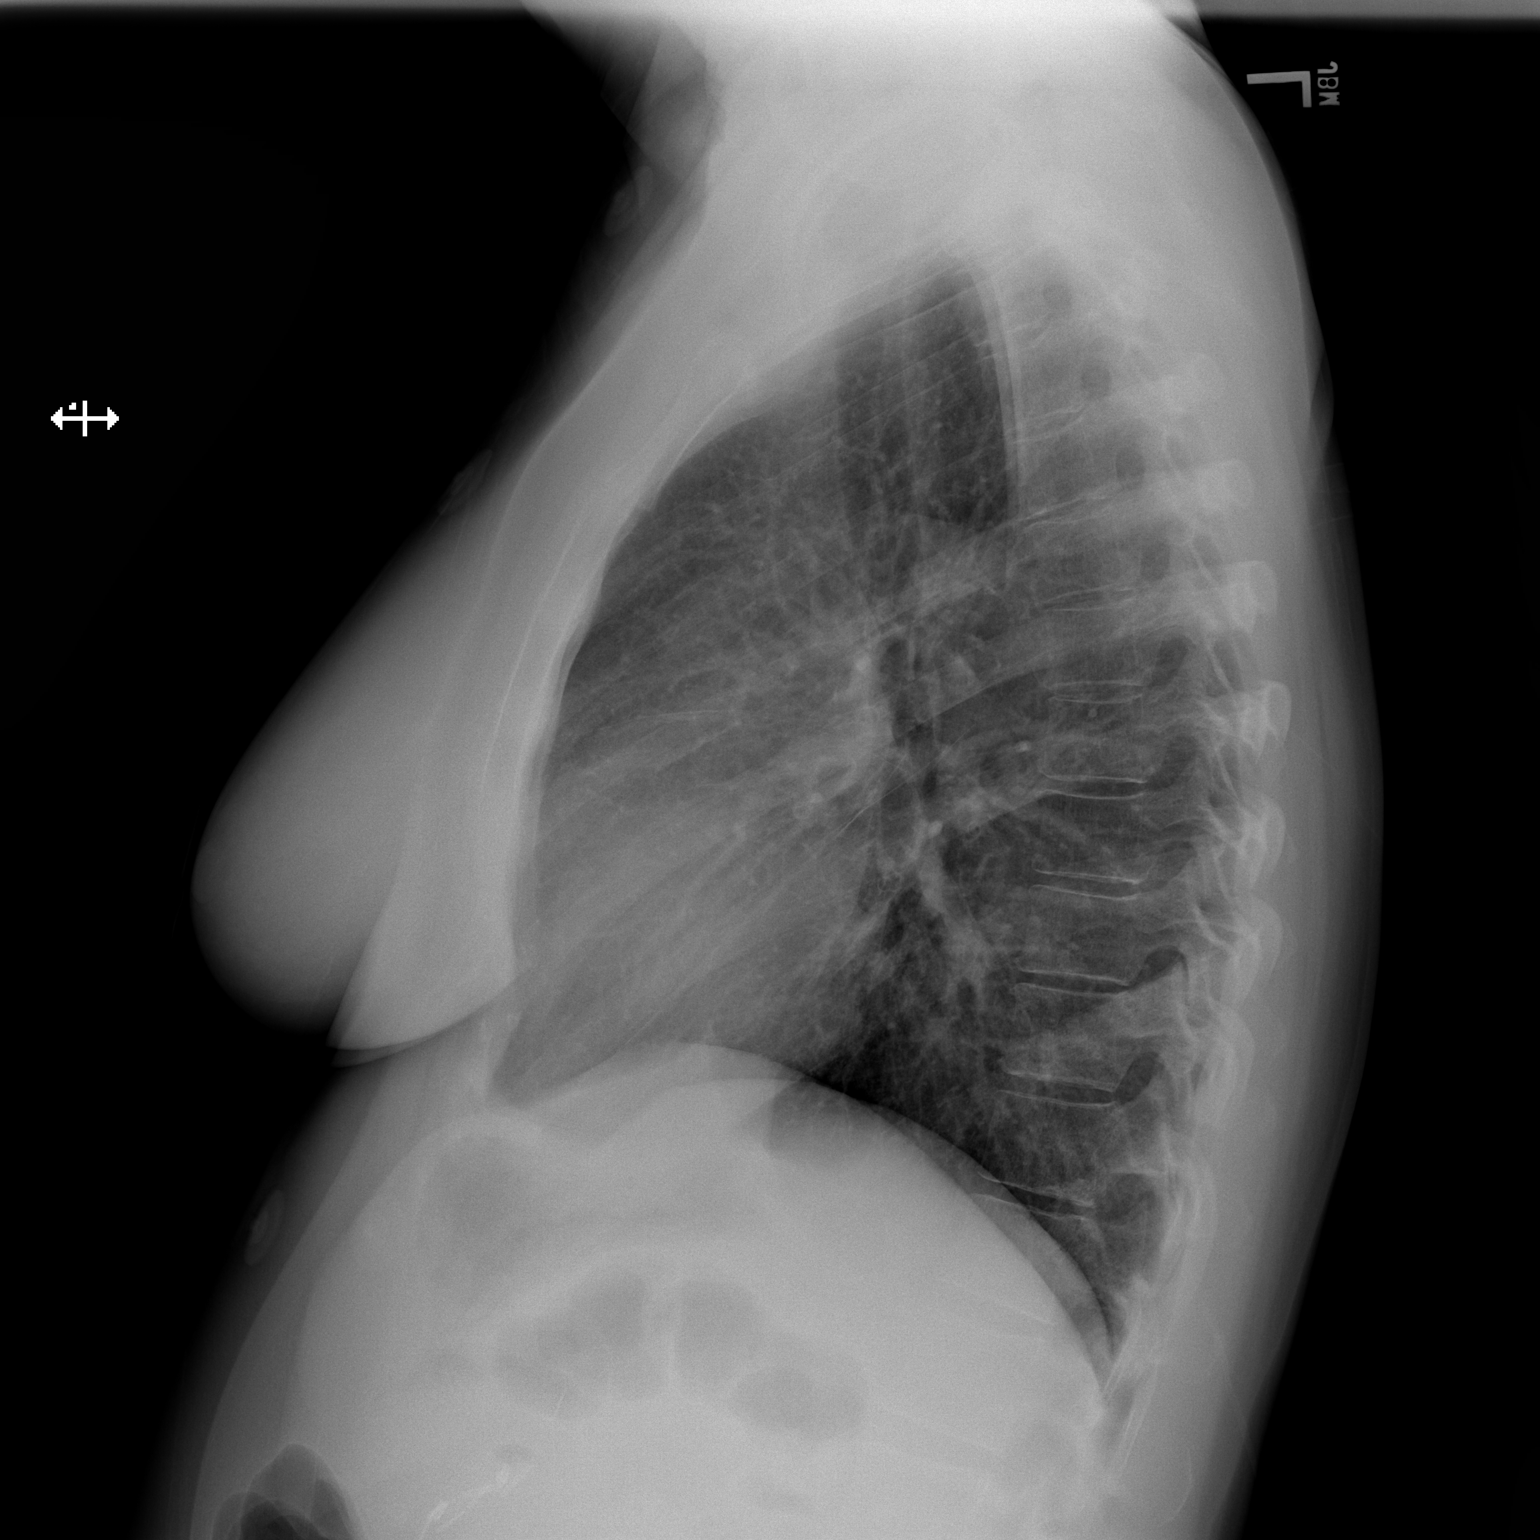

[2 of 2 positions shown; findings below may reference images not displayed]

FINDINGS: Lung volumes remain normal. Stable cardiac size at the upper limits
of normal. Other mediastinal contours are within normal limits.
Visualized tracheal air column is within normal limits. Lung
parenchyma is within normal limits. No pneumothorax or pleural
effusion. No acute osseous abnormality identified. Stable
cholecystectomy clips.
IMPRESSION: No acute cardiopulmonary abnormality.

## 2017-05-02 ENCOUNTER — Other Ambulatory Visit: Payer: Self-pay | Admitting: Family Medicine

## 2017-05-02 DIAGNOSIS — K580 Irritable bowel syndrome with diarrhea: Secondary | ICD-10-CM

## 2017-05-25 ENCOUNTER — Other Ambulatory Visit (HOSPITAL_COMMUNITY)
Admission: RE | Admit: 2017-05-25 | Discharge: 2017-05-25 | Disposition: A | Discharge status: Home / Self Care | Payer: Medicaid Other | Source: Ambulatory Visit | Attending: Family Medicine | Admitting: Family Medicine

## 2017-05-25 ENCOUNTER — Encounter: Payer: Self-pay | Admitting: Family Medicine

## 2017-05-25 ENCOUNTER — Ambulatory Visit: Payer: Medicaid Other | Attending: Family Medicine | Admitting: Family Medicine

## 2017-05-25 ENCOUNTER — Other Ambulatory Visit: Payer: Self-pay | Admitting: Family Medicine

## 2017-05-25 VITALS — BP 130/86 | HR 70 | Temp 98.1°F | Ht 67.0 in | Wt 160.2 lb

## 2017-05-25 DIAGNOSIS — Z885 Allergy status to narcotic agent status: Secondary | ICD-10-CM | POA: Diagnosis not present

## 2017-05-25 DIAGNOSIS — Z1239 Encounter for other screening for malignant neoplasm of breast: Secondary | ICD-10-CM

## 2017-05-25 DIAGNOSIS — M1990 Unspecified osteoarthritis, unspecified site: Secondary | ICD-10-CM | POA: Insufficient documentation

## 2017-05-25 DIAGNOSIS — Z794 Long term (current) use of insulin: Secondary | ICD-10-CM | POA: Diagnosis not present

## 2017-05-25 DIAGNOSIS — F329 Major depressive disorder, single episode, unspecified: Secondary | ICD-10-CM | POA: Diagnosis not present

## 2017-05-25 DIAGNOSIS — F419 Anxiety disorder, unspecified: Secondary | ICD-10-CM | POA: Insufficient documentation

## 2017-05-25 DIAGNOSIS — J449 Chronic obstructive pulmonary disease, unspecified: Secondary | ICD-10-CM | POA: Insufficient documentation

## 2017-05-25 DIAGNOSIS — Z1159 Encounter for screening for other viral diseases: Secondary | ICD-10-CM | POA: Diagnosis not present

## 2017-05-25 DIAGNOSIS — Z9049 Acquired absence of other specified parts of digestive tract: Secondary | ICD-10-CM | POA: Diagnosis not present

## 2017-05-25 DIAGNOSIS — Z888 Allergy status to other drugs, medicaments and biological substances status: Secondary | ICD-10-CM | POA: Insufficient documentation

## 2017-05-25 DIAGNOSIS — Z8739 Personal history of other diseases of the musculoskeletal system and connective tissue: Secondary | ICD-10-CM | POA: Insufficient documentation

## 2017-05-25 DIAGNOSIS — Z0000 Encounter for general adult medical examination without abnormal findings: Secondary | ICD-10-CM

## 2017-05-25 DIAGNOSIS — Z23 Encounter for immunization: Secondary | ICD-10-CM | POA: Diagnosis not present

## 2017-05-25 DIAGNOSIS — Z1231 Encounter for screening mammogram for malignant neoplasm of breast: Secondary | ICD-10-CM

## 2017-05-25 DIAGNOSIS — Z886 Allergy status to analgesic agent status: Secondary | ICD-10-CM | POA: Diagnosis not present

## 2017-05-25 DIAGNOSIS — I5022 Chronic systolic (congestive) heart failure: Secondary | ICD-10-CM | POA: Insufficient documentation

## 2017-05-25 DIAGNOSIS — E119 Type 2 diabetes mellitus without complications: Secondary | ICD-10-CM

## 2017-05-25 DIAGNOSIS — Z79899 Other long term (current) drug therapy: Secondary | ICD-10-CM | POA: Insufficient documentation

## 2017-05-25 DIAGNOSIS — Z91040 Latex allergy status: Secondary | ICD-10-CM | POA: Diagnosis not present

## 2017-05-25 DIAGNOSIS — Z0001 Encounter for general adult medical examination with abnormal findings: Secondary | ICD-10-CM | POA: Insufficient documentation

## 2017-05-25 DIAGNOSIS — K649 Unspecified hemorrhoids: Secondary | ICD-10-CM

## 2017-05-25 DIAGNOSIS — Z882 Allergy status to sulfonamides status: Secondary | ICD-10-CM | POA: Insufficient documentation

## 2017-05-25 DIAGNOSIS — Z881 Allergy status to other antibiotic agents status: Secondary | ICD-10-CM | POA: Diagnosis not present

## 2017-05-25 DIAGNOSIS — K644 Residual hemorrhoidal skin tags: Secondary | ICD-10-CM | POA: Diagnosis not present

## 2017-05-25 DIAGNOSIS — K219 Gastro-esophageal reflux disease without esophagitis: Secondary | ICD-10-CM | POA: Insufficient documentation

## 2017-05-25 DIAGNOSIS — Z7982 Long term (current) use of aspirin: Secondary | ICD-10-CM | POA: Insufficient documentation

## 2017-05-25 DIAGNOSIS — E1165 Type 2 diabetes mellitus with hyperglycemia: Secondary | ICD-10-CM | POA: Insufficient documentation

## 2017-05-25 DIAGNOSIS — Z880 Allergy status to penicillin: Secondary | ICD-10-CM | POA: Insufficient documentation

## 2017-05-25 DIAGNOSIS — I110 Hypertensive heart disease with heart failure: Secondary | ICD-10-CM | POA: Insufficient documentation

## 2017-05-25 DIAGNOSIS — K588 Other irritable bowel syndrome: Secondary | ICD-10-CM

## 2017-05-25 DIAGNOSIS — I4891 Unspecified atrial fibrillation: Secondary | ICD-10-CM | POA: Diagnosis not present

## 2017-05-25 DIAGNOSIS — Z124 Encounter for screening for malignant neoplasm of cervix: Secondary | ICD-10-CM

## 2017-05-25 DIAGNOSIS — K580 Irritable bowel syndrome with diarrhea: Secondary | ICD-10-CM

## 2017-05-25 LAB — GLUCOSE, POCT (MANUAL RESULT ENTRY): POC Glucose: 310 mg/dl — AB (ref 70–99)

## 2017-05-25 MED ORDER — INSULIN GLARGINE 100 UNIT/ML SOLOSTAR PEN: 20.0000 [IU] | PEN_INJECTOR | Freq: Every day | SUBCUTANEOUS | 5 refills | Status: DC

## 2017-05-25 NOTE — Patient Instructions (Signed)

## 2017-05-25 NOTE — Progress Notes (Signed)
Subjective:  Patient ID: [name], female    DOB: 1961-03-03  Age: 56 y.o. MRN: [id number]  CC: Annual Exam and Gynecologic Exam   HPI [name] presents for a complete physical exam.  She does have hemorrhoids which have been bothersome and sometimes bleed; they have not responded to use of hemorrhoid creams and she is requesting excision as this affects her sexual life. She also has increased bowel movements which were initially diarrhea.  Metformin was discontinued at her last office visit with very minimal improvement in her symptoms  She is requesting testing for hepatitis C.  Past Medical History:  Diagnosis Date  . Anxiety   . Arthritis   . Chronic systolic heart failure (HCC)    EF 20-25% ECHO 05/2015  . COPD (chronic obstructive pulmonary disease) ([place])   . Depression   . Diabetes mellitus without complication ([place])    Type II  . Dysrhythmia    pt unsure of name of arrythmia - " my heart rate will drop all of a sudden" -no current treatment - atrial flutter  . Fibrillation, atrial ([place])   . Gallstones   . GERD (gastroesophageal reflux disease)   . Headache(784.0)    migraines  . Hypertension   . Osteomyelitis (HCC)    R ankle  . Rash    arms    Past Surgical History:  Procedure Laterality Date  . ANKLE FRACTURE SURGERY Right 2015  . CARDIAC CATHETERIZATION N/A 06/24/2015   Procedure: Left Heart Cath and Coronary Angiography;  Surgeon: Belva Crome, MD;  Location: [facility];  Service: Cardiovascular;  Laterality: N/A;  . CARPAL TUNNEL RELEASE     bil  . CHOLECYSTECTOMY  11/24/2011   Procedure: LAPAROSCOPIC CHOLECYSTECTOMY WITH INTRAOPERATIVE CHOLANGIOGRAM;  Surgeon: Joyice Faster. Cornett, MD;  Location: [facility];  Service: General;  Laterality: N/A;  Laparoscopic Cholecystectomy with Cholangiogram  . COLONOSCOPY    . ECTOPIC PREGNANCY SURGERY  many yrs ago  . HARDWARE REMOVAL Right 07/15/2015   Procedure: RIGHT ANKLE HARDWARE REMOVAL, PLACEMENT  OF STIMULAN ANTIBIOTIC BEADS AND PREVENA WOUND VAC.;  Surgeon: Meredith Pel, MD;  Location: [facility];  Service: Orthopedics;  Laterality: Right;  . TOOTH EXTRACTION N/A 12/03/2016   Procedure: DENTAL EXTRACTIONS with Alveoloplasty;  Surgeon: Diona Browner, DDS;  Location: [facility];  Service: Oral Surgery;  Laterality: N/A;    Allergies  Allergen Reactions  . Biaxin [Clarithromycin] Anaphylaxis and Swelling  . Clarithromycin Shortness Of Breath and Swelling  . Lisinopril Swelling and Cough    Lip edema  . Sulfa Antibiotics Anaphylaxis, Shortness Of Breath, Swelling and Hypertension  . Chlorthalidone Nausea And Vomiting and Other (See Comments)    Clammy, Tachycardia, Headache   . Daptomycin Nausea And Vomiting  . Latex Hives and Rash  . Amoxicillin-Pot Clavulanate Diarrhea    Has patient had a PCN reaction causing immediate rash, facial/tongue/throat swelling, SOB or lightheadedness with hypotension:No Has patient had a PCN reaction causing severe rash involving mucus membranes or skin necrosis:No Has patient had a PCN reaction that required hospitalization:No Has patient had a PCN reaction occurring within THE LAST 10 YEARS.  #  #  #  YES  #  #  #  If all of the above answers are "NO", then may proceed with Cephalosporin use.   . Aspirin Nausea And Vomiting and Rash    On '325mg'$  dosage  . Cholestyramine Nausea And Vomiting  . Dilaudid [Hydromorphone Hcl] Nausea And Vomiting  .  Lasix [Furosemide] Nausea And Vomiting and Other (See Comments)    Headache       Outpatient Medications Prior to Visit  Medication Sig Dispense Refill  . albuterol (PROVENTIL HFA;VENTOLIN HFA) 108 (90 Base) MCG/ACT inhaler Inhale 2 puffs into the lungs every 6 (six) hours as needed for wheezing or shortness of breath. For wheezing 1 Inhaler 3  . aspirin 81 MG EC tablet Take 1 tablet (81 mg total) by mouth daily. 30 tablet 2  . Blood Glucose Monitoring Suppl (ACCU-CHEK AVIVA) device Use as instructed daily.  (Patient taking differently: 1 each by Other route 3 (three) times daily. ) 1 each 0  . dicyclomine (BENTYL) 10 MG capsule Take 1 capsule (10 mg total) by mouth 3 (three) times daily before meals. 90 capsule 5  . esomeprazole (NEXIUM) 20 MG capsule Take 1 capsule (20 mg total) by mouth daily. 30 capsule 5  . FLUoxetine (PROZAC) 40 MG capsule Take 1 capsule (40 mg total) by mouth at bedtime. 30 capsule 5  . glimepiride (AMARYL) 4 MG tablet Take 2 tablets (8 mg total) by mouth daily with breakfast. 60 tablet 5  . glucose blood (ACCU-CHEK AVIVA) test strip 1 each by Other route 3 (three) times daily. 100 each 5  . hydrOXYzine (ATARAX/VISTARIL) 10 MG tablet Take 1 tablet (10 mg total) by mouth daily. 30 tablet 5  . Insulin Pen Needle 31G X 5 MM MISC 1 each by Does not apply route at bedtime. 30 each 5  . Lancet Devices (ACCU-CHEK SOFTCLIX) lancets Use as instructed daily. (Patient taking differently: 1 each by Other route 3 (three) times daily. ) 1 each 5  . loperamide (IMODIUM) 2 MG capsule Take 4 mg by mouth 2 (two) times daily.    . meclizine (ANTIVERT) 25 MG tablet Take 1 tablet (25 mg total) by mouth 3 (three) times daily as needed for dizziness. 60 tablet 3  . metoprolol succinate (TOPROL-XL) 25 MG 24 hr tablet Take 1 tablet (25 mg total) by mouth daily. 30 tablet 5  . pravastatin (PRAVACHOL) 40 MG tablet Take 1 tablet (40 mg total) by mouth daily. 30 tablet 5  . tiotropium (SPIRIVA) 18 MCG inhalation capsule Place 1 capsule (18 mcg total) into inhaler and inhale daily. 30 capsule 5  . Insulin Glargine (LANTUS SOLOSTAR) 100 UNIT/ML Solostar Pen Inject 10 Units into the skin daily at 10 pm. 3 pen 5  . oxyCODONE-acetaminophen (PERCOCET/ROXICET) 5-325 MG tablet Take 1 tablet by mouth every 12 (twelve) hours as needed for severe pain. (Patient not taking: Reported on 05/25/2017) 60 tablet 0   No facility-administered medications prior to visit.     ROS Review of Systems  Constitutional: Negative  for activity change, appetite change and fatigue.  HENT: Negative for congestion, sinus pressure and sore throat.   Eyes: Negative for visual disturbance.  Respiratory: Negative for cough, chest tightness, shortness of breath and wheezing.   Cardiovascular: Negative for chest pain and palpitations.  Gastrointestinal: Negative for abdominal distention, abdominal pain and constipation.  Endocrine: Negative for polydipsia.  Genitourinary: Negative for dysuria and frequency.  Musculoskeletal: Negative for arthralgias and back pain.  Skin: Negative for rash.  Neurological: Negative for tremors, light-headedness and numbness.  Hematological: Does not bruise/bleed easily.  Psychiatric/Behavioral: Negative for agitation and behavioral problems.    Objective:  BP 130/86   Pulse 70   Temp 98.1 F (36.7 C) (Oral)   Ht '5\' 7"'$  (1.702 m)   Wt 160 lb 3.2 oz (72.7  kg)   LMP 07/24/2011   SpO2 97%   BMI 25.09 kg/m   BP/Weight 05/25/2017 03/29/2017 6/38/7564  Systolic BP 332 951 884  Diastolic BP 86 76 88  Wt. (Lbs) 160.2 160 -  BMI 25.09 25.06 24.43      Physical Exam  Constitutional: She is oriented to person, place, and time. She appears well-developed and well-nourished. No distress.  HENT:  Head: Normocephalic.  Right Ear: External ear normal.  Left Ear: External ear normal.  Nose: Nose normal.  Mouth/Throat: Oropharynx is clear and moist.  Eyes: Conjunctivae and EOM are normal. Pupils are equal, round, and reactive to light.  Neck: Normal range of motion. No JVD present.  Cardiovascular: Normal rate, regular rhythm, normal heart sounds and intact distal pulses. Exam reveals no gallop.  No murmur heard. Pulmonary/Chest: Effort normal and breath sounds normal. No respiratory distress. She has no wheezes. She has no rales. She exhibits no tenderness. Right breast exhibits no mass, no skin change and no tenderness. Left breast exhibits no mass, no skin change and no tenderness.    Abdominal: Soft. Bowel sounds are normal. She exhibits no distension and no mass. There is no tenderness.  Genitourinary: Rectal exam shows external hemorrhoid.  Genitourinary Comments: External genitalia, vagina, cervix, adnexa-normal  Musculoskeletal: Normal range of motion. She exhibits no edema or tenderness.  Neurological: She is alert and oriented to person, place, and time. She has normal reflexes.  Skin: Skin is warm and dry. She is not diaphoretic.  Psychiatric: She has a normal mood and affect.    Lab Results  Component Value Date   HGBA1C 9.0 03/29/2017    Assessment & Plan:   1. Annual physical exam Counseled on 150 minutes of exercise every week, healthy eating, routine healthcare maintenance.   2. Diabetes mellitus without complication ([employer]) Uncontrolled with A1c of 9.0 Increase Lantus to 20 units at bedtime - POCT glucose (manual entry) - CMP14+EGFR - Lipid panel - Microalbumin/Creatinine Ratio, Urine  3. Screening for breast cancer - MM Digital Screening; Future  4. Screening for cervical cancer - Cytology - PAP [place]  5. Bleeding hemorrhoids - Ambulatory referral to General Surgery  6. Other irritable bowel syndrome - Ambulatory referral to Gastroenterology  7. Type 2 diabetes mellitus with hyperglycemia, with long-term current use of insulin (HCC) - Insulin Glargine (LANTUS SOLOSTAR) 100 UNIT/ML Solostar Pen; Inject 20 Units into the skin daily at 10 pm.  Dispense: 3 pen; Refill: 5  8. Need for pneumococcal vaccine  9. Need for influenza vaccination - Flu Vaccine QUAD 36+ mos IM  10. Need for hepatitis C screening test - Hepatitis c antibody (reflex)   Meds ordered this encounter  Medications  . Insulin Glargine (LANTUS SOLOSTAR) 100 UNIT/ML Solostar Pen    Sig: Inject 20 Units into the skin daily at 10 pm.    Dispense:  3 pen    Refill:  5    Discontinue previous dose    Follow-up: Return in about 3 months (around 08/25/2017) for  Follow-up on diabetes mellitus.   Arnoldo Morale MD

## 2017-05-26 LAB — CMP14+EGFR
ALK PHOS: 151 IU/L — AB (ref 39–117)
ALT: 26 IU/L (ref 0–32)
AST: 21 IU/L (ref 0–40)
Albumin/Globulin Ratio: 1.6 (ref 1.2–2.2)
Albumin: 4.4 g/dL (ref 3.5–5.5)
BUN/Creatinine Ratio: 11 (ref 9–23)
BUN: 7 mg/dL (ref 6–24)
Bilirubin Total: 0.2 mg/dL (ref 0.0–1.2)
CALCIUM: 9.4 mg/dL (ref 8.7–10.2)
CO2: 24 mmol/L (ref 20–29)
CREATININE: 0.61 mg/dL (ref 0.57–1.00)
Chloride: 100 mmol/L (ref 96–106)
GFR calc Af Amer: 118 mL/min/{1.73_m2} (ref >59)
GFR, EST NON AFRICAN AMERICAN: 102 mL/min/{1.73_m2} (ref >59)
Globulin, Total: 2.8 g/dL (ref 1.5–4.5)
Glucose: 276 mg/dL — ABNORMAL HIGH (ref 65–99)
Potassium: 4 mmol/L (ref 3.5–5.2)
SODIUM: 139 mmol/L (ref 134–144)
Total Protein: 7.2 g/dL (ref 6.0–8.5)

## 2017-05-26 LAB — HCV COMMENT:

## 2017-05-26 LAB — MICROALBUMIN / CREATININE URINE RATIO
Creatinine, Urine: 204.4 mg/dL
MICROALB/CREAT RATIO: 13.2 mg/g{creat} (ref 0.0–30.0)
Microalbumin, Urine: 27 ug/mL

## 2017-05-26 LAB — LIPID PANEL
CHOL/HDL RATIO: 5.6 ratio — AB (ref 0.0–4.4)
CHOLESTEROL TOTAL: 156 mg/dL (ref 100–199)
HDL: 28 mg/dL — AB (ref >39)
LDL CALC: 63 mg/dL (ref 0–99)
TRIGLYCERIDES: 323 mg/dL — AB (ref 0–149)
VLDL CHOLESTEROL CAL: 65 mg/dL — AB (ref 5–40)

## 2017-05-26 LAB — HEPATITIS C ANTIBODY (REFLEX): HCV Ab: <0.1 s/co ratio (ref 0.0–0.9)

## 2017-05-30 ENCOUNTER — Telehealth: Payer: Self-pay

## 2017-05-30 LAB — CYTOLOGY - PAP
Diagnosis: NEGATIVE
HPV: NOT DETECTED

## 2017-05-30 NOTE — Telephone Encounter (Signed)
Pt was called and informed of lab results. 

## 2017-06-24 ENCOUNTER — Other Ambulatory Visit: Payer: Self-pay | Admitting: Family Medicine

## 2017-06-27 ENCOUNTER — Other Ambulatory Visit: Payer: Self-pay | Admitting: Family Medicine

## 2017-07-20 ENCOUNTER — Emergency Department (HOSPITAL_COMMUNITY)
Admission: EM | Admit: 2017-07-20 | Discharge: 2017-07-21 | Disposition: A | Discharge status: Home / Self Care | Payer: Medicaid Other | Attending: Emergency Medicine | Admitting: Emergency Medicine

## 2017-07-20 ENCOUNTER — Emergency Department (HOSPITAL_COMMUNITY): Payer: Medicaid Other

## 2017-07-20 ENCOUNTER — Encounter (HOSPITAL_COMMUNITY): Payer: Self-pay | Admitting: Emergency Medicine

## 2017-07-20 ENCOUNTER — Other Ambulatory Visit: Payer: Self-pay

## 2017-07-20 DIAGNOSIS — I5022 Chronic systolic (congestive) heart failure: Secondary | ICD-10-CM | POA: Insufficient documentation

## 2017-07-20 DIAGNOSIS — J449 Chronic obstructive pulmonary disease, unspecified: Secondary | ICD-10-CM | POA: Insufficient documentation

## 2017-07-20 DIAGNOSIS — I110 Hypertensive heart disease with heart failure: Secondary | ICD-10-CM | POA: Diagnosis not present

## 2017-07-20 DIAGNOSIS — E119 Type 2 diabetes mellitus without complications: Secondary | ICD-10-CM | POA: Diagnosis not present

## 2017-07-20 DIAGNOSIS — Z7982 Long term (current) use of aspirin: Secondary | ICD-10-CM | POA: Diagnosis not present

## 2017-07-20 DIAGNOSIS — R072 Precordial pain: Secondary | ICD-10-CM

## 2017-07-20 DIAGNOSIS — Z91040 Latex allergy status: Secondary | ICD-10-CM | POA: Diagnosis not present

## 2017-07-20 DIAGNOSIS — M542 Cervicalgia: Secondary | ICD-10-CM

## 2017-07-20 DIAGNOSIS — F17210 Nicotine dependence, cigarettes, uncomplicated: Secondary | ICD-10-CM | POA: Insufficient documentation

## 2017-07-20 DIAGNOSIS — Z794 Long term (current) use of insulin: Secondary | ICD-10-CM | POA: Diagnosis not present

## 2017-07-20 DIAGNOSIS — Z79899 Other long term (current) drug therapy: Secondary | ICD-10-CM | POA: Insufficient documentation

## 2017-07-20 DIAGNOSIS — R079 Chest pain, unspecified: Secondary | ICD-10-CM | POA: Diagnosis present

## 2017-07-20 LAB — I-STAT TROPONIN, ED
TROPONIN I, POC: 0.01 ng/mL (ref 0.00–0.08)
TROPONIN I, POC: 0 ng/mL (ref 0.00–0.08)

## 2017-07-20 LAB — CBC
HCT: 46.4 % — ABNORMAL HIGH (ref 36.0–46.0)
HEMOGLOBIN: 15.7 g/dL — AB (ref 12.0–15.0)
MCH: 29 pg (ref 26.0–34.0)
MCHC: 33.8 g/dL (ref 30.0–36.0)
MCV: 85.8 fL (ref 78.0–100.0)
PLATELETS: 153 10*3/uL (ref 150–400)
RBC: 5.41 MIL/uL — AB (ref 3.87–5.11)
RDW: 13.6 % (ref 11.5–15.5)
WBC: 11 10*3/uL — AB (ref 4.0–10.5)

## 2017-07-20 LAB — BASIC METABOLIC PANEL
ANION GAP: 13 (ref 5–15)
BUN: 9 mg/dL (ref 6–20)
CALCIUM: 9.1 mg/dL (ref 8.9–10.3)
CO2: 21 mmol/L — ABNORMAL LOW (ref 22–32)
CREATININE: 0.59 mg/dL (ref 0.44–1.00)
Chloride: 101 mmol/L (ref 101–111)
GFR calc non Af Amer: >60 mL/min (ref >60)
Glucose, Bld: 297 mg/dL — ABNORMAL HIGH (ref 65–99)
Potassium: 3.9 mmol/L (ref 3.5–5.1)
SODIUM: 135 mmol/L (ref 135–145)

## 2017-07-20 MED ORDER — IOPAMIDOL (ISOVUE-370) INJECTION 76%
INTRAVENOUS | Status: AC
  Administered 2017-07-20: 50 mL
  Filled 2017-07-20: qty 50

## 2017-07-20 NOTE — ED Triage Notes (Signed)
Chest pain started last night, midsternal, radiating to left side-- woke up this am with pain in neck. Has been nauseated, arms weak,

## 2017-07-20 NOTE — ED Notes (Signed)
Patient transported to CT 

## 2017-07-20 NOTE — ED Notes (Signed)
Pt remains in waiting room. Updated on wait for treatment room. 

## 2017-07-20 NOTE — ED Provider Notes (Signed)
[facility] EMERGENCY DEPARTMENT Provider Note   CSN: [id number] Arrival date & time: 07/20/17  1611     History   Chief Complaint Chief Complaint  Patient presents with  . Chest Pain    HPI [name] is a 57 y.o. female with past medical history of CHF with preserved EF, COPD, type 2 diabetes, GERD, hypertension, presenting to the ED for central chest pressure that began this morning upon awakening.  Patient states the pain is a tightness, squeezing feeling rating into her left chest up into her left neck and left back.  She reports associated nausea and weakness.  No diaphoresis.  Patient states the pain is improving, however still present, and worse into the left neck.  She states her neck pain is feels like a headache going down into her neck, worse with moving her head and palpation.  Denies associated shortness of breath, palpitations, lower extremity pain or swelling, or other complaints.  Per chart review, cardiac echo done in April 2018, with normal EF and grade 1 diastolic dysfunction.  Prior to that, left heart cath was done in December 2016, with widely patent coronary arteries.    The history is provided by the patient.    Past Medical History:  Diagnosis Date  . Anxiety   . Arthritis   . Chronic systolic heart failure (HCC)    EF 20-25% ECHO 05/2015  . COPD (chronic obstructive pulmonary disease) (HCC)   . Depression   . Diabetes mellitus without complication (HCC)    Type II  . Dysrhythmia    pt unsure of name of arrythmia - " my heart rate will drop all of a sudden" -no current treatment - atrial flutter  . Fibrillation, atrial (HCC)   . Gallstones   . GERD (gastroesophageal reflux disease)   . Headache(784.0)    migraines  . Hypertension   . Osteomyelitis (HCC)    R ankle  . Rash    arms    Patient Active Problem List   Diagnosis Date Noted  . 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 Primary osteoarthritis of both hands 10/06/2016  .  History of gastroesophageal reflux (GERD) 10/06/2016  . History of COPD 10/06/2016  . History of anxiety 10/06/2016  . History of CHF (congestive heart failure) 10/06/2016  . History of hypertension 10/06/2016  . History of cardiac dysrhythmia 10/06/2016  . Primary osteoarthritis of both feet 10/06/2016  . Primary osteoarthritis of both knees 10/06/2016  . History of infection/ ankle post operative  10/06/2016  . Rheumatoid factor positive 10/06/2016  . De Quervain's disease (radial styloid tenosynovitis) 06/09/2016  . Tobacco abuse 05/23/2016  . Hyperglycemia without ketosis   . Bleeding hemorrhoids 11/03/2015  . Acute blood loss anemia 11/03/2015  . Hematochezia 11/01/2015  . Back pain with sciatica 09/08/2015  . Infection of bone of ankle (HCC) 07/15/2015  . Chronic systolic CHF (congestive heart failure) (HCC) 06/24/2015  . Chest pain 06/03/2015  . Hypertension   . Anxiety   . Gallstones   . GERD (gastroesophageal reflux disease)   . COPD (chronic obstructive pulmonary disease) (HCC)   . Dysrhythmia   . Rash   . Depression   . Diabetes mellitus without complication (HCC)   . Post-operative state 12/10/2011    Past Surgical History:  Procedure Laterality Date  . ANKLE FRACTURE SURGERY Right 2015  . CARDIAC CATHETERIZATION N/A 06/24/2015   Procedure: Left Heart Cath and Coronary Angiography;  Surgeon: [name], MD;  Location: [facility] INVASIVE CV LAB;  Service: Cardiovascular;  Laterality: N/A;  . CARPAL TUNNEL RELEASE     bil  . CHOLECYSTECTOMY  11/24/2011   Procedure: LAPAROSCOPIC CHOLECYSTECTOMY WITH INTRAOPERATIVE CHOLANGIOGRAM;  Surgeon: Clovis Pu. Cornett, MD;  Location: [facility] ORS;  Service: General;  Laterality: N/A;  Laparoscopic Cholecystectomy with Cholangiogram  . COLONOSCOPY    . ECTOPIC PREGNANCY SURGERY  many yrs ago  . HARDWARE REMOVAL Right 07/15/2015   Procedure: RIGHT ANKLE HARDWARE REMOVAL, PLACEMENT OF STIMULAN ANTIBIOTIC BEADS AND PREVENA WOUND VAC.;   Surgeon: [name], MD;  Location: [facility] OR;  Service: Orthopedics;  Laterality: Right;  . TOOTH EXTRACTION N/A 12/03/2016   Procedure: DENTAL EXTRACTIONS with Alveoloplasty;  Surgeon: Ocie Doyne, DDS;  Location: [facility] OR;  Service: Oral Surgery;  Laterality: N/A;    OB History    Gravida Para Term Preterm AB Living   9       5 4    SAB TAB Ectopic Multiple Live Births   4   1   4        Home Medications    Prior to Admission medications   Medication Sig Start Date End Date Taking? Authorizing Provider  ACCU-CHEK SOFTCLIX LANCETS lancets USE AS DIRECTED DAILY UP TO THREE TIMES DAILY 05/25/17   Jaclyn Shaggy, MD  albuterol (PROVENTIL HFA;VENTOLIN HFA) 108 (90 Base) MCG/ACT inhaler Inhale 2 puffs into the lungs every 6 (six) hours as needed for wheezing or shortness of breath. For wheezing 03/29/17   Jaclyn Shaggy, MD  aspirin 81 MG EC tablet Take 1 tablet (81 mg total) by mouth daily. 05/25/16   Nyra Market, MD  Blood Glucose Monitoring Suppl (ACCU-CHEK AVIVA) device Use as instructed daily. Patient taking differently: 1 each by Other route 3 (three) times daily.  11/24/16   Jaclyn Shaggy, MD  dicyclomine (BENTYL) 10 MG capsule TAKE ONE CAPSULE BY MOUTH THREE TIMES DAILY BEFORE MEALS 05/26/17   Jaclyn Shaggy, MD  esomeprazole (NEXIUM) 20 MG capsule Take 1 capsule (20 mg total) by mouth daily. 03/29/17   Jaclyn Shaggy, MD  FLUoxetine (PROZAC) 40 MG capsule Take 1 capsule (40 mg total) by mouth at bedtime. 03/29/17   Jaclyn Shaggy, MD  glimepiride (AMARYL) 4 MG tablet Take 2 tablets (8 mg total) by mouth daily with breakfast. 03/29/17   Jaclyn Shaggy, MD  glucose blood (ACCU-CHEK AVIVA) test strip 1 each by Other route 3 (three) times daily. 03/29/17   Jaclyn Shaggy, MD  hydrOXYzine (ATARAX/VISTARIL) 10 MG tablet Take 1 tablet (10 mg total) by mouth daily. 03/29/17   Jaclyn Shaggy, MD  Insulin Glargine (LANTUS SOLOSTAR) 100 UNIT/ML Solostar Pen Inject 20 Units into the skin daily at 10 pm.  05/25/17   Jaclyn Shaggy, MD  Insulin Pen Needle 31G X 5 MM MISC 1 each by Does not apply route at bedtime. 03/29/17   Jaclyn Shaggy, MD  Lancet Devices [facility]) lancets Use as instructed daily. Patient taking differently: 1 each by Other route 3 (three) times daily.  11/24/16   Jaclyn Shaggy, MD  loperamide (IMODIUM) 2 MG capsule Take 4 mg by mouth 2 (two) times daily.    [provider]  meclizine (ANTIVERT) 25 MG tablet Take 1 tablet (25 mg total) by mouth 3 (three) times daily as needed for dizziness. 03/29/17   Jaclyn Shaggy, MD  metoprolol succinate (TOPROL-XL) 25 MG 24 hr tablet Take 1 tablet (25 mg total) by mouth daily. 03/29/17   Jaclyn Shaggy, MD  oxyCODONE-acetaminophen (PERCOCET/ROXICET) 5-325  MG tablet Take 1 tablet by mouth every 12 (twelve) hours as needed for severe pain. Patient not taking: Reported on 05/25/2017 03/02/17   [name], MD  pravastatin (PRAVACHOL) 40 MG tablet Take 1 tablet (40 mg total) by mouth daily. 03/29/17   Jaclyn Shaggy, MD  tiotropium (SPIRIVA) 18 MCG inhalation capsule Place 1 capsule (18 mcg total) into inhaler and inhale daily. 03/29/17   Jaclyn Shaggy, MD    Family History Family History  Problem Relation Age of Onset  . Lung cancer Father   . Heart attack Father 65       CABG x4  . Breast cancer Sister        Survivor     Social History Social History   Tobacco Use  . Smoking status: Light Tobacco Smoker    Packs/day: 0.00    Years: 20.00    Pack years: 0.00    Types: Cigarettes, E-cigarettes    Start date: 09/19/2016  . Smokeless tobacco: Never Used  Substance Use Topics  . Alcohol use: No    Alcohol/week: 0.0 oz  . Drug use: No     Allergies   Biaxin [clarithromycin]; Clarithromycin; Lisinopril; Sulfa antibiotics; Chlorthalidone; Daptomycin; Latex; Amoxicillin-pot clavulanate; Aspirin; Cholestyramine; Dilaudid [hydromorphone hcl]; and Lasix [furosemide]   Review of Systems Review of Systems    Constitutional: Negative for diaphoresis.  Respiratory: Negative for shortness of breath.   Cardiovascular: Positive for chest pain. Negative for palpitations and leg swelling.  Gastrointestinal: Positive for nausea.  Musculoskeletal: Positive for neck pain.  All other systems reviewed and are negative.    Physical Exam Updated Vital Signs BP (!) 144/88   Pulse 61   Temp 98.6 F (37 C) (Oral)   Resp 13   Ht 5\' 7"  (1.702 m)   Wt 72.6 kg (160 lb)   LMP 07/24/2011   SpO2 98%   BMI 25.06 kg/m   Physical Exam  Constitutional: She appears well-developed and well-nourished.  Non-toxic appearance. She does not appear ill. No distress.  HENT:  Head: Normocephalic and atraumatic.  Mouth/Throat: Oropharynx is clear and moist.  Eyes: Conjunctivae are normal.  Neck: Normal range of motion. Neck supple. No JVD present. No tracheal deviation present.  Cardiovascular: Normal rate, regular rhythm, normal heart sounds, intact distal pulses and normal pulses. Exam reveals no gallop and no friction rub.  No murmur heard. No carotid bruit. Normal carotid pulses bilaterally  Pulmonary/Chest: Effort normal and breath sounds normal. No stridor. No respiratory distress. She has no wheezes. She has no rales.  Abdominal: Soft. Bowel sounds are normal. She exhibits no distension. There is no tenderness.  Musculoskeletal:  No LE edema or tenderness  Lymphadenopathy:    She has no cervical adenopathy.  Neurological: She is alert.  Skin: Skin is warm.  Psychiatric: She has a normal mood and affect. Her behavior is normal.  Nursing note and vitals reviewed.    ED Treatments / Results  Labs (all labs ordered are listed, but only abnormal results are displayed) Labs Reviewed  BASIC METABOLIC PANEL - Abnormal; Notable for the following components:      Result Value   CO2 21 (*)    Glucose, Bld 297 (*)    All other components within normal limits  CBC - Abnormal; Notable for the following  components:   WBC 11.0 (*)    RBC 5.41 (*)    Hemoglobin 15.7 (*)    HCT 46.4 (*)    All other components within  normal limits  I-STAT TROPONIN, ED  I-STAT TROPONIN, ED    EKG  EKG Interpretation  Date/Time:  Wednesday July 20 2017 16:36:24 EST Ventricular Rate:  70 PR Interval:  156 QRS Duration: 122 QT Interval:  450 QTC Calculation: 486 R Axis:   -52 Text Interpretation:  Normal sinus rhythm with sinus arrhythmia Possible Left atrial enlargement Left axis deviation Non-specific intra-ventricular conduction delay Nonspecific ST and T wave abnormality Abnormal ECG No STEMI.  Confirmed by Alona Bene [phone]) on 07/20/2017 9:25:05 PM       Radiology Dg Chest 2 View  Result Date: 07/20/2017 CLINICAL DATA:  57 year old female with a history of chest pain EXAM: CHEST  2 VIEW COMPARISON:  02/16/2017, 05/23/2016 FINDINGS: Cardiomediastinal silhouette within normal limits. No evidence of central vascular congestion. No pneumothorax or pleural effusion. No confluent airspace disease. No acute displaced fracture. Surgical changes of cholecystectomy IMPRESSION: Negative for acute cardiopulmonary disease Electronically Signed   By: Gilmer Mor D.O.   On: 07/20/2017 17:54   Ct Angio Neck W And/or Wo Contrast  Result Date: 07/20/2017 CLINICAL DATA:  Initial evaluation for acute left-sided neck pain radiating into left shoulder. EXAM: CT ANGIOGRAPHY NECK TECHNIQUE: Multidetector CT imaging of the neck was performed using the standard protocol during bolus administration of intravenous contrast. Multiplanar CT image reconstructions and MIPs were obtained to evaluate the vascular anatomy. Carotid stenosis measurements (when applicable) are obtained utilizing NASCET criteria, using the distal internal carotid diameter as the denominator. CONTRAST:  <See Chart> ISOVUE-370 IOPAMIDOL (ISOVUE-370) INJECTION 76% COMPARISON:  None. FINDINGS: Aortic arch: Visualized aortic arch of normal caliber with  normal branch pattern. Mild plaque noted within the arch itself. Plaque noted at the origin of the left subclavian artery without stenosis. Visualized subclavian arteries are widely patent bilaterally. Right carotid system: Right common carotid artery widely patent to the bifurcation without stenosis. Mild atheromatous irregularity about the proximal right ICA without significant narrowing. Right ICA widely patent to the skull base without stenosis, dissection, or occlusion. Left carotid system: Left common carotid artery widely patent to the bifurcation. Minimal plaque about the proximal left ICA without stenosis. Left ICA widely patent to the skull base without stenosis, dissection, or occlusion. Vertebral arteries: Both of the vertebral arteries arise from the subclavian arteries. Vertebral arteries patent within the neck without stenosis, dissection, or occlusion. Skeleton: No acute osseus abnormality. No worrisome lytic or blastic osseous lesions. Other neck: No acute soft tissue abnormality within the neck. Salivary glands are normal. Subcentimeter hypodense nodule noted within the inferior right thyroid lobe, of doubtful significance. Thyroid otherwise unremarkable. No adenopathy within the neck. Upper chest: Visualized upper chest within normal limits. Visualized lungs are clear. Mild centrilobular and paraseptal emphysema. IMPRESSION: 1. No CTA evidence for acute vascular abnormality within the neck. No evidence for dissection. Mild atheromatous disease for age without hemodynamically significant stenosis. 2. Emphysema. Electronically Signed   By: Rise Mu M.D.   On: 07/20/2017 23:51    Procedures Procedures (including critical care time)  Medications Ordered in ED Medications  iopamidol (ISOVUE-370) 76 % injection (50 mLs  Contrast Given 07/20/17 2305)     Initial Impression / Assessment and Plan / ED Course  I have reviewed the triage vital signs and the nursing notes.  Pertinent  labs & imaging results that were available during my care of the patient were reviewed by me and considered in my medical decision making (see chart for details).     Pt presenting with chest pain  at rest, radiating into left neck. Chest pain is not likely of acute cardiac or pulmonary etiology d/t presentation, VSS, no tracheal deviation, no JVD or new murmur, RRR, breath sounds equal bilaterally, EKG without acute abnormalities, negative troponin x2, and negative CXR. CTA of neck done to rule out dissection given patient's neck pain; imaging is negative. Pt with improvement in sx in ED. Heart score of 4. Had shared decision making with patient regarding overnight observation versus close outpatient follow up. Pt's Cardiologist is Dr. Elease Hashimoto. She states she is able to call first thing in the morning and feels well for discharge. Pt seems reliable for follow up. Pt has been advised to return to the ED if CP becomes exertional, associated with diaphoresis or nausea, radiates to left jaw/arm, worsens or becomes concerning in any way. Pt appears reliable for follow up and is agreeable to discharge.   Patient discussed with  Dr. Jacqulyn Bath, who agrees with care plan.  Discussed results, findings, treatment and follow up. Patient advised of return precautions. Patient verbalized understanding and agreed with plan.  Final Clinical Impressions(s) / ED Diagnoses   Final diagnoses:  Precordial chest pain  Neck pain on left side    ED Discharge Orders    None       Aamir Mclinden, [place] N, [name] 07/21/17 0010    Maia Plan, MD 07/21/17 [phone]

## 2017-07-20 NOTE — ED Notes (Signed)
Pt remains in waiting room. Updated on wait for treatment room.  Pt is here with fiance that is also a pt.  They both went outside to smoke.

## 2017-07-20 NOTE — ED Notes (Signed)
Spoke with CT about pt. MD and PA doing CT angio of neck to r/o dissection.

## 2017-07-21 NOTE — ED Notes (Signed)
Pt states that she did not get anything for pain. C/o headache.  States that she does not want to wait any longer and just wants to go home. States that a tech took her IV out and she is ready to go. Pt dressed and took dc papers and walked out.

## 2017-07-21 NOTE — Discharge Instructions (Signed)
Please read instructions below. Call your Cardiologist first thing in the morning to schedule a follow up appointment regarding your ED visit today.  Return to the ER for new or worsening symptoms; including worsening chest pain, shortness of breath, pain that radiates to the arm or neck, pain or shortness of breath worsened with exertion.

## 2017-08-04 ENCOUNTER — Encounter: Payer: Self-pay | Admitting: Cardiology

## 2017-08-04 ENCOUNTER — Ambulatory Visit: Payer: Medicaid Other | Admitting: Cardiology

## 2017-08-04 VITALS — BP 134/80 | HR 70 | Ht 67.0 in | Wt 161.8 lb

## 2017-08-04 DIAGNOSIS — R002 Palpitations: Secondary | ICD-10-CM | POA: Diagnosis not present

## 2017-08-04 DIAGNOSIS — R079 Chest pain, unspecified: Secondary | ICD-10-CM | POA: Diagnosis not present

## 2017-08-04 DIAGNOSIS — E785 Hyperlipidemia, unspecified: Secondary | ICD-10-CM | POA: Diagnosis not present

## 2017-08-04 MED ORDER — FENOFIBRATE 48 MG PO TABS: 48.0000 mg | ORAL_TABLET | Freq: Every day | ORAL | 0 refills | Status: DC

## 2017-08-04 NOTE — Patient Instructions (Addendum)
Medication Instructions:  Your physician has recommended you make the following change in your medication:  START: fenofibrate 48 mg once a day    Labwork: Today for thyroid and liver function panel  Your physician recommends that you return for lab work in: 6 weeks for fasting lipid panel and liver function panel   Testing/Procedures: Your physician has recommended that you wear a holter monitor. Holter monitors are medical devices that record the heart's electrical activity. Doctors most often use these monitors to diagnose arrhythmias. Arrhythmias are problems with the speed or rhythm of the heartbeat. The monitor is a small, portable device. You can wear one while you do your normal daily activities. This is usually used to diagnose what is causing palpitations/syncope (passing out).  Your physician has requested that you have en exercise stress myoview. For further information please visit [email or web address] Please follow instruction sheet, as given.   Follow-Up: Your physician recommends that you schedule a follow-up appointment in: 2-3 weeks after stress test with Boyce Medici, PA    Any Other Special Instructions Will Be Listed Below (If Applicable).     If you need a refill on your cardiac medications before your next appointment, please call your pharmacy.

## 2017-08-04 NOTE — Progress Notes (Signed)
08/04/2017 [name]   1960-08-10  [id number]  Primary Physician Hoy Register, MD Primary Cardiologist: Dr. Elease Hashimoto   Reason for Visit/CC: Post ED f/u for CP and Palpitations  HPI:  [name] is a 57 y.o. female with history of systolic heart failure.  EF in 2016 was 25-30% however subsequent cardiac catheterization at that time revealed widely patent coronary arteries.  She was determined to have nonischemic cardiomyopathy which improved with medical therapy.  Her most recent echocardiogram April 2018 showed normalization of EF at 55-60%.  She has multiple other cardiac risk factors including poorly controlled diabetes mellitus.  She has not been compliant with her insulin regimen.  She also has hypertension which is well-controlled today as well as hypertriglyceridemia in the 300 range.  She is on statin therapy with pravastatin but she is on no other lipid-lowering agents.  She presents to clinic today for post ED follow-up.  She was recently seen in the [facility] emergency department given complaints of left-sided chest pain with radiation to the left neck.  Pain felt like pressure.  Episode lasted for about 10-15 minutes.  ED cardiac enzymes were negative x2.  EKG nonischemic.  Chest CT was negative for dissection.  Chest x-ray was unremarkable.  CBC and BMP was also unremarkable.  Patient was reassured and instructed to follow-up in our office for further evaluation.  Today in clinic she reports that she continues to have occasional episodes of left-sided chest discomfort that feels like pressure.  Can occur at rest as well as with exertion.  Episodes last less than 5 minutes.  No association with meals.  No dyspnea but does report frequent palpitations.  Slight flutters in her chest.  Occurs several times a day.  She denies syncope/near syncope.  Reports full medication compliance except for with insulin.  Her PCP recently recommended that she start this but she has yet to do it.   Most recent HgA1c was 9.0.  BP is well controlled today.  Last lipid panel in November showed controlled LDL at 63 mg/dL however her triglycerides were markedly elevated at 323.  She continues to smoke cigarettes.  She has been a smoker for over 20 years.  She smokes less than half a pack per day.  She is currently asymptomatic in clinic today.  No outpatient medications have been marked as taking for the 08/04/17 encounter (Office Visit) with Allayne Butcher, [name].   Allergies  Allergen Reactions  . Biaxin [Clarithromycin] Anaphylaxis and Swelling  . Clarithromycin Shortness Of Breath and Swelling  . Lisinopril Swelling and Cough    Lip edema  . Sulfa Antibiotics Anaphylaxis, Shortness Of Breath, Swelling and Hypertension  . Chlorthalidone Nausea And Vomiting and Other (See Comments)    Clammy, Tachycardia, Headache   . Daptomycin Nausea And Vomiting  . Latex Hives and Rash  . Amoxicillin-Pot Clavulanate Diarrhea    Has patient had a PCN reaction causing immediate rash, facial/tongue/throat swelling, SOB or lightheadedness with hypotension:No Has patient had a PCN reaction causing severe rash involving mucus membranes or skin necrosis:No Has patient had a PCN reaction that required hospitalization:No Has patient had a PCN reaction occurring within THE LAST 10 YEARS.  #  #  #  YES  #  #  #  If all of the above answers are "NO", then may proceed with Cephalosporin use.   . Aspirin Nausea And Vomiting and Rash    On 325mg  dosage  . Cholestyramine Nausea And Vomiting  .  Dilaudid [Hydromorphone Hcl] Nausea And Vomiting  . Lasix [Furosemide] Nausea And Vomiting and Other (See Comments)    Headache    Past Medical History:  Diagnosis Date  . Anxiety   . Arthritis   . Chronic systolic heart failure (HCC)    EF 20-25% ECHO 05/2015  . COPD (chronic obstructive pulmonary disease) (HCC)   . Depression   . Diabetes mellitus without complication (HCC)    Type II  . Dysrhythmia    pt  unsure of name of arrythmia - " my heart rate will drop all of a sudden" -no current treatment - atrial flutter  . Fibrillation, atrial (HCC)   . Gallstones   . GERD (gastroesophageal reflux disease)   . Headache(784.0)    migraines  . Hypertension   . Osteomyelitis (HCC)    R ankle  . Rash    arms   Family History  Problem Relation Age of Onset  . Lung cancer Father   . Heart attack Father 91       CABG x4  . Breast cancer Sister        Survivor    Past Surgical History:  Procedure Laterality Date  . ANKLE FRACTURE SURGERY Right 2015  . CARDIAC CATHETERIZATION N/A 06/24/2015   Procedure: Left Heart Cath and Coronary Angiography;  Surgeon: [name], MD;  Location: [facility] INVASIVE CV LAB;  Service: Cardiovascular;  Laterality: N/A;  . CARPAL TUNNEL RELEASE     bil  . CHOLECYSTECTOMY  11/24/2011   Procedure: LAPAROSCOPIC CHOLECYSTECTOMY WITH INTRAOPERATIVE CHOLANGIOGRAM;  Surgeon: Clovis Pu. Cornett, MD;  Location: [facility] ORS;  Service: General;  Laterality: N/A;  Laparoscopic Cholecystectomy with Cholangiogram  . COLONOSCOPY    . ECTOPIC PREGNANCY SURGERY  many yrs ago  . HARDWARE REMOVAL Right 07/15/2015   Procedure: RIGHT ANKLE HARDWARE REMOVAL, PLACEMENT OF STIMULAN ANTIBIOTIC BEADS AND PREVENA WOUND VAC.;  Surgeon: [name], MD;  Location: [facility] OR;  Service: Orthopedics;  Laterality: Right;  . TOOTH EXTRACTION N/A 12/03/2016   Procedure: DENTAL EXTRACTIONS with Alveoloplasty;  Surgeon: Ocie Doyne, DDS;  Location: [facility] OR;  Service: Oral Surgery;  Laterality: N/A;   Social History   Socioeconomic History  . Marital status: Divorced    Spouse name: Not on file  . Number of children: Not on file  . Years of education: Not on file  . Highest education level: Not on file  Social Needs  . Financial resource strain: Not on file  . Food insecurity - worry: Not on file  . Food insecurity - inability: Not on file  . Transportation needs - medical: Not on file  .  Transportation needs - non-medical: Not on file  Occupational History  . Not on file  Tobacco Use  . Smoking status: Light Tobacco Smoker    Packs/day: 0.00    Years: 20.00    Pack years: 0.00    Types: Cigarettes, E-cigarettes    Start date: 09/19/2016  . Smokeless tobacco: Never Used  Substance and Sexual Activity  . Alcohol use: No    Alcohol/week: 0.0 oz  . Drug use: No  . Sexual activity: Not Currently    Birth control/protection: Post-menopausal  Other Topics Concern  . Not on file  Social History Narrative  . Not on file     Review of Systems: General: negative for chills, fever, night sweats or weight changes.  Cardiovascular: negative for chest pain, dyspnea on exertion, edema, orthopnea, palpitations, paroxysmal nocturnal dyspnea or shortness of  breath Dermatological: negative for rash Respiratory: negative for cough or wheezing Urologic: negative for hematuria Abdominal: negative for nausea, vomiting, diarrhea, bright red blood per rectum, melena, or hematemesis Neurologic: negative for visual changes, syncope, or dizziness All other systems reviewed and are otherwise negative except as noted above.   Physical Exam:  Blood pressure 134/80, pulse 70, height 5\' 7"  (1.702 m), weight 161 lb 12.8 oz (73.4 kg), last menstrual period 07/24/2011.  General appearance: alert, cooperative and no distress Neck: no carotid bruit and no JVD Lungs: clear to auscultation bilaterally Heart: regular rate and rhythm, S1, S2 normal, no murmur, click, rub or gallop Extremities: extremities normal, atraumatic, no cyanosis or edema Pulses: 2+ and symmetric Skin: Skin color, texture, turgor normal. No rashes or lesions Neurologic: Grossly normal  EKG performed today.  EKG from ED reviewed and normal sinus rhythm.  Regular rate and rhythm on exam.-- personally reviewed   ASSESSMENT AND PLAN:   1.  Chest Pain: Patient with normal left heart catheterization in December 2016.  However  she has multiple risk factors for CAD including poorly controlled diabetes, hypertension and hypertriglyceridemia as well as tobacco use.  She also notes a strong family history of coronary artery disease prematurely.  Her father had coronary artery bypass grafting at the age of 37.  We will set up for an exercise Myoview.  Continue medical management and risk factor modification.  2. Palpitations: We will arrange for 48-hour cardiac monitor and will also check a TSH today.  Recent CBC and BMP in the emergency department was unremarkable. Continue BB therapy with metoprolol.   3.  Diabetes: Controlled.  Recent hemoglobin A1c 9.0.  Patient advised by PCP to start insulin.  Patient encouraged to follow PCP orders.  4.  Hypertriglyceridemia: 323 on recent fasting lipid panel.  LDL is at goal at 63 mg/dL.  Continue pravastatin.  We will check hepatic function test today and if normal will start fenofibrate, 48 mg daily.  Repeat fasting lipid panel and hepatic function test in 6 weeks.  5.  Tobacco abuse: She notes 20+ year history.  Patient was encouraged to quit smoking.  Follow-Up 2-3 weeks after studies are completed.  Brittainy Ressie Diamond, MHS [facility] 08/04/2017 1:44 PM

## 2017-08-05 LAB — HEPATIC FUNCTION PANEL
ALBUMIN: 4.2 g/dL (ref 3.5–5.5)
ALT: 23 IU/L (ref 0–32)
AST: 19 IU/L (ref 0–40)
Alkaline Phosphatase: 145 IU/L — ABNORMAL HIGH (ref 39–117)
BILIRUBIN TOTAL: 0.3 mg/dL (ref 0.0–1.2)
Bilirubin, Direct: 0.09 mg/dL (ref 0.00–0.40)
Total Protein: 7.4 g/dL (ref 6.0–8.5)

## 2017-08-05 LAB — TSH: TSH: 0.667 u[IU]/mL (ref 0.450–4.500)

## 2017-08-08 ENCOUNTER — Other Ambulatory Visit: Payer: Self-pay | Admitting: Family Medicine

## 2017-08-08 ENCOUNTER — Telehealth (HOSPITAL_COMMUNITY): Payer: Self-pay | Admitting: *Deleted

## 2017-08-08 DIAGNOSIS — H8113 Benign paroxysmal vertigo, bilateral: Secondary | ICD-10-CM

## 2017-08-08 NOTE — Telephone Encounter (Signed)
Patient given detailed instructions per Myocardial Perfusion Study Information Sheet for the test on 08/11/17. Patient notified to arrive 15 minutes early and that it is imperative to arrive on time for appointment to keep from having the test rescheduled.  If you need to cancel or reschedule your appointment, please call the office within 24 hours of your appointment. . Patient verbalized understanding. [name]    

## 2017-08-11 ENCOUNTER — Ambulatory Visit (HOSPITAL_COMMUNITY): Payer: Medicaid Other | Attending: Cardiology

## 2017-08-11 ENCOUNTER — Ambulatory Visit (INDEPENDENT_AMBULATORY_CARE_PROVIDER_SITE_OTHER): Payer: Medicaid Other

## 2017-08-11 DIAGNOSIS — R079 Chest pain, unspecified: Secondary | ICD-10-CM | POA: Insufficient documentation

## 2017-08-11 DIAGNOSIS — I428 Other cardiomyopathies: Secondary | ICD-10-CM | POA: Diagnosis not present

## 2017-08-11 DIAGNOSIS — I10 Essential (primary) hypertension: Secondary | ICD-10-CM | POA: Diagnosis not present

## 2017-08-11 DIAGNOSIS — R002 Palpitations: Secondary | ICD-10-CM | POA: Diagnosis not present

## 2017-08-11 DIAGNOSIS — Z794 Long term (current) use of insulin: Secondary | ICD-10-CM | POA: Insufficient documentation

## 2017-08-11 DIAGNOSIS — I2510 Atherosclerotic heart disease of native coronary artery without angina pectoris: Secondary | ICD-10-CM | POA: Diagnosis not present

## 2017-08-11 DIAGNOSIS — E119 Type 2 diabetes mellitus without complications: Secondary | ICD-10-CM | POA: Insufficient documentation

## 2017-08-11 LAB — MYOCARDIAL PERFUSION IMAGING
CHL CUP NUCLEAR SDS: 6
CHL CUP NUCLEAR SRS: 8
CHL CUP NUCLEAR SSS: 14
CSEPED: 6 min
CSEPEW: 7 METS
CSEPPHR: 142 {beats}/min
Exercise duration (sec): 0 s
LV dias vol: 100 mL (ref 46–106)
LV sys vol: 49 mL
MPHR: 165 {beats}/min
NUC STRESS TID: 1.01
Percent HR: 86 %
RATE: 0.3
RPE: 18
Rest HR: 73 {beats}/min

## 2017-08-11 MED ORDER — TECHNETIUM TC 99M TETROFOSMIN IV KIT
31.9000 | PACK | Freq: Once | INTRAVENOUS | Status: AC | PRN
  Administered 2017-08-11: 31.9 via INTRAVENOUS
  Filled 2017-08-11: qty 32

## 2017-08-11 MED ORDER — TECHNETIUM TC 99M TETROFOSMIN IV KIT
10.5000 | PACK | Freq: Once | INTRAVENOUS | Status: AC | PRN
  Administered 2017-08-11: 10.5 via INTRAVENOUS
  Filled 2017-08-11: qty 11

## 2017-08-11 MED ORDER — REGADENOSON 0.4 MG/5ML IV SOLN: 0.4000 mg | Freq: Once | INTRAVENOUS | Status: DC

## 2017-08-11 MED ORDER — TECHNETIUM TC 99M TETROFOSMIN IV KIT
32.8000 | PACK | Freq: Once | INTRAVENOUS | Status: DC | PRN
  Filled 2017-08-11: qty 33

## 2017-08-15 ENCOUNTER — Telehealth: Payer: Self-pay | Admitting: Cardiology

## 2017-08-15 NOTE — Telephone Encounter (Signed)
-----   Message from Allayne Butcher, [place] sent at 08/14/2017  1:08 PM EST ----- Stress test is low risk. There are no signs of decreased blood flow.

## 2017-08-15 NOTE — Progress Notes (Signed)
Pt has been made aware of normal result and verbalized understanding.  jw

## 2017-08-15 NOTE — Telephone Encounter (Signed)
New Message ° ° °Patient is returning call in reference to stress test. Please call to discuss.  °

## 2017-08-15 NOTE — Telephone Encounter (Signed)
Returned pts call and she has been made aware of her stress test results. See result note. 

## 2017-08-19 ENCOUNTER — Encounter: Payer: Self-pay | Admitting: Cardiology

## 2017-08-19 ENCOUNTER — Ambulatory Visit: Payer: Medicaid Other | Admitting: Cardiology

## 2017-08-19 VITALS — BP 140/98 | HR 84 | Ht 67.0 in | Wt 158.0 lb

## 2017-08-19 DIAGNOSIS — R0789 Other chest pain: Secondary | ICD-10-CM | POA: Diagnosis not present

## 2017-08-19 NOTE — Patient Instructions (Signed)
Medication Instructions:  Your physician recommends that you continue on your current medications as directed. Please refer to the Current Medication list given to you today.   Labwork: Keep your lab appointment for March 21 and come in fasting (nothing but water after midnight the night before)   Testing/Procedures: None Ordered   Follow-Up: Your physician wants you to follow-up in: 6 months with Dr. Elease Hashimoto.  You will receive a reminder letter in the mail two months in advance. If you don't receive a letter, please call our office to schedule the follow-up appointment.   If you need a refill on your cardiac medications before your next appointment, please call your pharmacy.   Thank you for choosing [facility]! Eligha Bridegroom, RN [phone]

## 2017-08-19 NOTE — Progress Notes (Signed)
08/19/2017 [name]   01-Jun-1961  [id number]  Primary Physician Hoy Register, MD Primary Cardiologist: Dr. Elease Hashimoto  Reason for Visit/CC: F/u for Chest Pain and Palpitations.   HPI:  [name] is a 57 y.o. female who is being seen today for f/u after recent w/u for CP and palpitations. She is here to review results of recent stress test and monitor.   In summary, she is a 57 y.o. female with history of systolic heart failure.  EF in 2016 was 25-30% however subsequent cardiac catheterization at that time revealed widely patent coronary arteries.  She was determined to have nonischemic cardiomyopathy which improved with medical therapy.  Her most recent echocardiogram April 2018 showed normalization of EF at 55-60%.  She has multiple other cardiac risk factors including poorly controlled diabetes mellitus.  She has not been compliant with her insulin regimen.  She also has hypertension which is well-controlled today as well as hypertriglyceridemia in the 300 range.  She is on statin therapy with pravastatin but she is on no other lipid-lowering agents.  She was recently seen in the [facility] emergency department given complaints of left-sided chest pain with radiation to the left neck.  Pain felt like pressure.  Episode lasted for about 10-15 minutes.  ED cardiac enzymes were negative x2.  EKG nonischemic. Chest CT was negative for dissection.  Chest x-ray was unremarkable.  CBC and BMP was also unremarkable.  Patient was reassured and instructed to follow-up in our office for further evaluation. I evaluated her on 08/04/17. She continued to endorse intermittent chest pain and daily palpitations, thus she was set up for NST and cardiac monitor. Stress test showed no evidence for prior infarct or ischemia. EF was 51%. 48 hr monitor showed sinus rhythm with bundle branch block. Occasional sinus bradycardia and sinus tachycardia. Occasional PVCs noted but no significant arrhthymias.   Also of  note, fenofibrate 48 mg daily was added to Pravasatin given elevated TGs. She is due to get repeat FLP and HFTs. She has been tolerating this w/o side effects. No myalgias or arthralgias.    She also denies any recurrent CP. She still has a flutter or too every now and then but no prolonged palpitations. She has been working on smoking cessation. She had cut down from 1 ppd>> 3 cigarettes a day.   NST 08/11/17 Study Highlights     Nuclear stress EF: 51%.  There was no ST segment deviation noted during stress.  The study is normal.  This is a low risk study.   Normal pharmacologic nuclear stress test with no evidence for prior infarct or ischemia.  LVEF calculated at 51% but appears better, correlation with echocardiogram is recommended.      48 hr Holter Monitor 07/2017 Study Highlights    Sinus rhythm with bundle branch block. Occasional sinus bradycardia and sinus tachycardia  Occasional PCC  No significant arrhythmias       Current Meds  Medication Sig  . ACCU-CHEK SOFTCLIX LANCETS lancets USE AS DIRECTED DAILY UP TO THREE TIMES DAILY  . albuterol (PROVENTIL HFA;VENTOLIN HFA) 108 (90 Base) MCG/ACT inhaler Inhale 2 puffs into the lungs every 6 (six) hours as needed for wheezing or shortness of breath. For wheezing  . aspirin 81 MG EC tablet Take 1 tablet (81 mg total) by mouth daily.  . Blood Glucose Monitoring Suppl (ACCU-CHEK AVIVA) device Use as instructed daily. (Patient taking differently: 1 each by Other route 3 (three) times daily. )  .  dicyclomine (BENTYL) 10 MG capsule TAKE ONE CAPSULE BY MOUTH THREE TIMES DAILY BEFORE MEALS  . esomeprazole (NEXIUM) 20 MG capsule Take 1 capsule (20 mg total) by mouth daily.  . fenofibrate (TRICOR) 48 MG tablet Take 1 tablet (48 mg total) by mouth daily.  Marland Kitchen FLUoxetine (PROZAC) 40 MG capsule Take 1 capsule (40 mg total) by mouth at bedtime.  Marland Kitchen glimepiride (AMARYL) 4 MG tablet Take 2 tablets (8 mg total) by mouth daily with  breakfast.  . glucose blood (ACCU-CHEK AVIVA) test strip 1 each by Other route 3 (three) times daily.  . hydrOXYzine (ATARAX/VISTARIL) 10 MG tablet Take 1 tablet (10 mg total) by mouth daily.  . Insulin Glargine (LANTUS SOLOSTAR) 100 UNIT/ML Solostar Pen Inject 20 Units into the skin daily at 10 pm.  . Insulin Pen Needle 31G X 5 MM MISC 1 each by Does not apply route at bedtime.  Demetra Shiner Devices (ACCU-CHEK SOFTCLIX) lancets Use as instructed daily. (Patient taking differently: 1 each by Other route 3 (three) times daily. )  . loperamide (IMODIUM) 2 MG capsule Take 4 mg by mouth 2 (two) times daily.  . meclizine (ANTIVERT) 25 MG tablet Take 1 tablet (25 mg total) by mouth 3 (three) times daily as needed for dizziness.  . metoprolol succinate (TOPROL-XL) 25 MG 24 hr tablet Take 1 tablet (25 mg total) by mouth daily.  Marland Kitchen oxyCODONE-acetaminophen (PERCOCET/ROXICET) 5-325 MG tablet Take 1 tablet by mouth every 12 (twelve) hours as needed for severe pain.  . pravastatin (PRAVACHOL) 40 MG tablet Take 1 tablet (40 mg total) by mouth daily.  Marland Kitchen tiotropium (SPIRIVA) 18 MCG inhalation capsule Place 1 capsule (18 mcg total) into inhaler and inhale daily.   Allergies  Allergen Reactions  . Biaxin [Clarithromycin] Anaphylaxis and Swelling  . Clarithromycin Shortness Of Breath and Swelling  . Lisinopril Swelling and Cough    Lip edema  . Sulfa Antibiotics Anaphylaxis, Shortness Of Breath, Swelling and Hypertension  . Chlorthalidone Nausea And Vomiting and Other (See Comments)    Clammy, Tachycardia, Headache   . Daptomycin Nausea And Vomiting  . Latex Hives and Rash  . Amoxicillin-Pot Clavulanate Diarrhea    Has patient had a PCN reaction causing immediate rash, facial/tongue/throat swelling, SOB or lightheadedness with hypotension:No Has patient had a PCN reaction causing severe rash involving mucus membranes or skin necrosis:No Has patient had a PCN reaction that required hospitalization:No Has patient  had a PCN reaction occurring within THE LAST 10 YEARS.  #  #  #  YES  #  #  #  If all of the above answers are "NO", then may proceed with Cephalosporin use.   . Aspirin Nausea And Vomiting and Rash    On 325mg  dosage  . Cholestyramine Nausea And Vomiting  . Dilaudid [Hydromorphone Hcl] Nausea And Vomiting  . Lasix [Furosemide] Nausea And Vomiting and Other (See Comments)    Headache    Past Medical History:  Diagnosis Date  . Anxiety   . Arthritis   . Chronic systolic heart failure (HCC)    EF 20-25% ECHO 05/2015  . COPD (chronic obstructive pulmonary disease) (HCC)   . Depression   . Diabetes mellitus without complication (HCC)    Type II  . Dysrhythmia    pt unsure of name of arrythmia - " my heart rate will drop all of a sudden" -no current treatment - atrial flutter  . Fibrillation, atrial (HCC)   . Gallstones   . GERD (gastroesophageal reflux disease)   .  Headache(784.0)    migraines  . Hypertension   . Osteomyelitis (HCC)    R ankle  . Rash    arms   Family History  Problem Relation Age of Onset  . Lung cancer Father   . Heart attack Father 35       CABG x4  . Breast cancer Sister        Survivor    Past Surgical History:  Procedure Laterality Date  . ANKLE FRACTURE SURGERY Right 2015  . CARDIAC CATHETERIZATION N/A 06/24/2015   Procedure: Left Heart Cath and Coronary Angiography;  Surgeon: [name], MD;  Location: [facility] INVASIVE CV LAB;  Service: Cardiovascular;  Laterality: N/A;  . CARPAL TUNNEL RELEASE     bil  . CHOLECYSTECTOMY  11/24/2011   Procedure: LAPAROSCOPIC CHOLECYSTECTOMY WITH INTRAOPERATIVE CHOLANGIOGRAM;  Surgeon: Clovis Pu. Cornett, MD;  Location: [facility] ORS;  Service: General;  Laterality: N/A;  Laparoscopic Cholecystectomy with Cholangiogram  . COLONOSCOPY    . ECTOPIC PREGNANCY SURGERY  many yrs ago  . HARDWARE REMOVAL Right 07/15/2015   Procedure: RIGHT ANKLE HARDWARE REMOVAL, PLACEMENT OF STIMULAN ANTIBIOTIC BEADS AND PREVENA WOUND VAC.;   Surgeon: [name], MD;  Location: [facility] OR;  Service: Orthopedics;  Laterality: Right;  . TOOTH EXTRACTION N/A 12/03/2016   Procedure: DENTAL EXTRACTIONS with Alveoloplasty;  Surgeon: Ocie Doyne, DDS;  Location: [facility] OR;  Service: Oral Surgery;  Laterality: N/A;   Social History   Socioeconomic History  . Marital status: Divorced    Spouse name: Not on file  . Number of children: Not on file  . Years of education: Not on file  . Highest education level: Not on file  Social Needs  . Financial resource strain: Not on file  . Food insecurity - worry: Not on file  . Food insecurity - inability: Not on file  . Transportation needs - medical: Not on file  . Transportation needs - non-medical: Not on file  Occupational History  . Not on file  Tobacco Use  . Smoking status: Light Tobacco Smoker    Packs/day: 0.00    Years: 20.00    Pack years: 0.00    Types: Cigarettes, E-cigarettes    Start date: 09/19/2016  . Smokeless tobacco: Never Used  Substance and Sexual Activity  . Alcohol use: No    Alcohol/week: 0.0 oz  . Drug use: No  . Sexual activity: Not Currently    Birth control/protection: Post-menopausal  Other Topics Concern  . Not on file  Social History Narrative  . Not on file     Review of Systems: General: negative for chills, fever, night sweats or weight changes.  Cardiovascular: negative for chest pain, dyspnea on exertion, edema, orthopnea, palpitations, paroxysmal nocturnal dyspnea or shortness of breath Dermatological: negative for rash Respiratory: negative for cough or wheezing Urologic: negative for hematuria Abdominal: negative for nausea, vomiting, diarrhea, bright red blood per rectum, melena, or hematemesis Neurologic: negative for visual changes, syncope, or dizziness All other systems reviewed and are otherwise negative except as noted above.   Physical Exam:  Blood pressure (!) 140/98, pulse 84, height 5\' 7"  (1.702 m), weight 158 lb (71.7 kg),  last menstrual period 07/24/2011.  General appearance: alert, cooperative and no distress Neck: no carotid bruit and no JVD Lungs: clear to auscultation bilaterally Heart: regular rate and rhythm, S1, S2 normal, no murmur, click, rub or gallop Extremities: extremities normal, atraumatic, no cyanosis or edema Pulses: 2+ and symmetric Skin: Skin color, texture, turgor  normal. No rashes or lesions Neurologic: Grossly normal  EKG not performed -- personally reviewed   ASSESSMENT AND PLAN:   1.  Chest Pain: Patient with normal left heart catheterization in December 2016. Exercise NST 08/11/17 negative for ischemia and infarction.  Suspect noncardiac chest pain. She also denies any further recurrence. Continue medical management and risk factor modification.  2. Palpitations: 48-hour cardiac monitor benign.  It showed sinus rhythm with bundle branch block. Occasional sinus bradycardia and sinus tachycardia. Occasional PVCs noted but no significant arrhthymias. Recent TSH, CBC and BMP normal.  Continue BB therapy with metoprolol.   3.  Diabetes: Poorly controlled.  Recent hemoglobin A1c 9.0.  Patient advised by PCP to start insulin.  Patient encouraged to follow PCP orders.  4.  Hypertriglyceridemia: 323 on recent fasting lipid panel.  LDL is at goal at 63 mg/dL.  Continue pravastatin.  Fenofibrate, 48 mg daily was added at last OV.  Repeat fasting lipid panel and hepatic function test in 4 more weeks. She denies myalgias, arthralgias.   5.  Tobacco abuse: She notes 20+ year history.  Patient was encouraged to quit smoking. She is down to 3 cigarettes a day. Previously smoking 1 ppd. She is trying to quit completely.   6. HTN: mildly elevated this morning but pt has not yet taken meds. She usually takes morning meds ~11 am. BP 140/98. Continue current regimen.    Follow-Up w/ Dr. Elease Hashimoto in 6 months.   Rashed Edler Jodye Hemmings, MHS [facility] 08/19/2017 11:00 AM

## 2017-08-25 ENCOUNTER — Ambulatory Visit: Payer: Medicaid Other | Admitting: Family Medicine

## 2017-08-29 ENCOUNTER — Other Ambulatory Visit: Payer: Self-pay

## 2017-08-29 DIAGNOSIS — Z7982 Long term (current) use of aspirin: Secondary | ICD-10-CM | POA: Insufficient documentation

## 2017-08-29 DIAGNOSIS — Z91040 Latex allergy status: Secondary | ICD-10-CM | POA: Diagnosis not present

## 2017-08-29 DIAGNOSIS — E119 Type 2 diabetes mellitus without complications: Secondary | ICD-10-CM | POA: Insufficient documentation

## 2017-08-29 DIAGNOSIS — F17210 Nicotine dependence, cigarettes, uncomplicated: Secondary | ICD-10-CM | POA: Diagnosis not present

## 2017-08-29 DIAGNOSIS — I5022 Chronic systolic (congestive) heart failure: Secondary | ICD-10-CM | POA: Diagnosis not present

## 2017-08-29 DIAGNOSIS — R42 Dizziness and giddiness: Secondary | ICD-10-CM | POA: Diagnosis not present

## 2017-08-29 DIAGNOSIS — R079 Chest pain, unspecified: Secondary | ICD-10-CM | POA: Insufficient documentation

## 2017-08-29 DIAGNOSIS — R21 Rash and other nonspecific skin eruption: Secondary | ICD-10-CM | POA: Insufficient documentation

## 2017-08-29 DIAGNOSIS — J449 Chronic obstructive pulmonary disease, unspecified: Secondary | ICD-10-CM | POA: Diagnosis not present

## 2017-08-29 DIAGNOSIS — Z794 Long term (current) use of insulin: Secondary | ICD-10-CM | POA: Diagnosis not present

## 2017-08-29 DIAGNOSIS — Z79899 Other long term (current) drug therapy: Secondary | ICD-10-CM | POA: Insufficient documentation

## 2017-08-29 DIAGNOSIS — I110 Hypertensive heart disease with heart failure: Secondary | ICD-10-CM | POA: Diagnosis not present

## 2017-08-29 NOTE — ED Triage Notes (Signed)
Pt c/o dizziness, L sided CP with "stinging" under L arm radiating to back @ 1 hour ago, pt reports having a "hot flash" with nausea.

## 2017-08-30 ENCOUNTER — Emergency Department (HOSPITAL_COMMUNITY): Payer: Medicaid Other

## 2017-08-30 ENCOUNTER — Emergency Department (HOSPITAL_COMMUNITY)
Admission: EM | Admit: 2017-08-30 | Discharge: 2017-08-30 | Disposition: A | Discharge status: Home / Self Care | Payer: Medicaid Other | Attending: Emergency Medicine | Admitting: Emergency Medicine

## 2017-08-30 ENCOUNTER — Encounter (HOSPITAL_COMMUNITY): Payer: Self-pay | Admitting: Emergency Medicine

## 2017-08-30 ENCOUNTER — Other Ambulatory Visit: Payer: Self-pay

## 2017-08-30 DIAGNOSIS — R21 Rash and other nonspecific skin eruption: Secondary | ICD-10-CM

## 2017-08-30 DIAGNOSIS — R072 Precordial pain: Secondary | ICD-10-CM

## 2017-08-30 DIAGNOSIS — R42 Dizziness and giddiness: Secondary | ICD-10-CM

## 2017-08-30 LAB — BASIC METABOLIC PANEL
Anion gap: 12 (ref 5–15)
BUN: 5 mg/dL — ABNORMAL LOW (ref 6–20)
CHLORIDE: 102 mmol/L (ref 101–111)
CO2: 23 mmol/L (ref 22–32)
Calcium: 9.2 mg/dL (ref 8.9–10.3)
Creatinine, Ser: 0.53 mg/dL (ref 0.44–1.00)
GFR calc non Af Amer: >60 mL/min (ref >60)
Glucose, Bld: 177 mg/dL — ABNORMAL HIGH (ref 65–99)
POTASSIUM: 3.3 mmol/L — AB (ref 3.5–5.1)
SODIUM: 137 mmol/L (ref 135–145)

## 2017-08-30 LAB — CBC
HEMATOCRIT: 46.2 % — AB (ref 36.0–46.0)
Hemoglobin: 16 g/dL — ABNORMAL HIGH (ref 12.0–15.0)
MCH: 29.6 pg (ref 26.0–34.0)
MCHC: 34.6 g/dL (ref 30.0–36.0)
MCV: 85.4 fL (ref 78.0–100.0)
Platelets: 146 10*3/uL — ABNORMAL LOW (ref 150–400)
RBC: 5.41 MIL/uL — AB (ref 3.87–5.11)
RDW: 12.8 % (ref 11.5–15.5)
WBC: 10.7 10*3/uL — AB (ref 4.0–10.5)

## 2017-08-30 LAB — TROPONIN I

## 2017-08-30 LAB — I-STAT TROPONIN, ED: Troponin i, poc: 0 ng/mL (ref 0.00–0.08)

## 2017-08-30 MED ORDER — PREDNISONE 10 MG PO TABS: ORAL_TABLET | ORAL | 0 refills | Status: DC

## 2017-08-30 MED ORDER — PREDNISONE 20 MG PO TABS
60.0000 mg | ORAL_TABLET | Freq: Once | ORAL | Status: AC
  Administered 2017-08-30: 60 mg via ORAL
  Filled 2017-08-30: qty 3

## 2017-08-30 NOTE — ED Provider Notes (Signed)
[facility] EMERGENCY DEPARTMENT Provider Note   CSN: [id number] Arrival date & time: 08/29/17  2336     History   Chief Complaint Chief Complaint  Patient presents with  . Chest Pain    HPI [name] is a 57 y.o. female.  Patient reports she had sudden onset of dizziness last night around 9:30 pm while sitting on the cough. She states it was room-spinning type dizziness similar to previous vertigo, however , no nausea or vomiting. After the dizziness she experience pain in the left side of her heart with radiation to the left axilla and back. The pain lasted several minutes then resolved. It has recurred approximately every 45 minutes through the night. She is currently pain free. She felt "a little" SOB. No cough or fever. She has significant risk factors for ACS including DM, HTN, HLD, smoker, family history (dad with x5 CABG in his 70's) She reports having negative stress and catheterizations in the recent past.    The history is provided by the patient. No language interpreter was used.  Chest Pain   Associated symptoms include dizziness and shortness of breath. Pertinent negatives include no diaphoresis, no fever, no vomiting and no weakness.    Past Medical History:  Diagnosis Date  . Anxiety   . Arthritis   . Chronic systolic heart failure (HCC)    EF 20-25% ECHO 05/2015  . COPD (chronic obstructive pulmonary disease) (HCC)   . Depression   . Diabetes mellitus without complication (HCC)    Type II  . Dysrhythmia    pt unsure of name of arrythmia - " my heart rate will drop all of a sudden" -no current treatment - atrial flutter  . Fibrillation, atrial (HCC)   . Gallstones   . GERD (gastroesophageal reflux disease)   . Headache(784.0)    migraines  . Hypertension   . Osteomyelitis (HCC)    R ankle  . Rash    arms    Patient Active Problem List   Diagnosis Date Noted  . 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 Primary osteoarthritis of both  hands 10/06/2016  . History of gastroesophageal reflux (GERD) 10/06/2016  . History of COPD 10/06/2016  . History of anxiety 10/06/2016  . History of CHF (congestive heart failure) 10/06/2016  . History of hypertension 10/06/2016  . History of cardiac dysrhythmia 10/06/2016  . Primary osteoarthritis of both feet 10/06/2016  . Primary osteoarthritis of both knees 10/06/2016  . History of infection/ ankle post operative  10/06/2016  . Rheumatoid factor positive 10/06/2016  . De Quervain's disease (radial styloid tenosynovitis) 06/09/2016  . Tobacco abuse 05/23/2016  . Hyperglycemia without ketosis   . Bleeding hemorrhoids 11/03/2015  . Acute blood loss anemia 11/03/2015  . Hematochezia 11/01/2015  . Back pain with sciatica 09/08/2015  . Infection of bone of ankle (HCC) 07/15/2015  . Chronic systolic CHF (congestive heart failure) (HCC) 06/24/2015  . Chest pain 06/03/2015  . Hypertension   . Anxiety   . Gallstones   . GERD (gastroesophageal reflux disease)   . COPD (chronic obstructive pulmonary disease) (HCC)   . Dysrhythmia   . Rash   . Depression   . Diabetes mellitus without complication (HCC)   . Post-operative state 12/10/2011    Past Surgical History:  Procedure Laterality Date  . ANKLE FRACTURE SURGERY Right 2015  . CARDIAC CATHETERIZATION N/A 06/24/2015   Procedure: Left Heart Cath and Coronary Angiography;  Surgeon: [name], MD;  Location:  [facility] INVASIVE CV LAB;  Service: Cardiovascular;  Laterality: N/A;  . CARPAL TUNNEL RELEASE     bil  . CHOLECYSTECTOMY  11/24/2011   Procedure: LAPAROSCOPIC CHOLECYSTECTOMY WITH INTRAOPERATIVE CHOLANGIOGRAM;  Surgeon: Clovis Pu. Cornett, MD;  Location: [facility] ORS;  Service: General;  Laterality: N/A;  Laparoscopic Cholecystectomy with Cholangiogram  . COLONOSCOPY    . ECTOPIC PREGNANCY SURGERY  many yrs ago  . HARDWARE REMOVAL Right 07/15/2015   Procedure: RIGHT ANKLE HARDWARE REMOVAL, PLACEMENT OF STIMULAN ANTIBIOTIC BEADS AND  PREVENA WOUND VAC.;  Surgeon: [name], MD;  Location: [facility] OR;  Service: Orthopedics;  Laterality: Right;  . TOOTH EXTRACTION N/A 12/03/2016   Procedure: DENTAL EXTRACTIONS with Alveoloplasty;  Surgeon: Ocie Doyne, DDS;  Location: [facility] OR;  Service: Oral Surgery;  Laterality: N/A;    OB History    Gravida Para Term Preterm AB Living   9       5 4    SAB TAB Ectopic Multiple Live Births   4   1   4        Home Medications    Prior to Admission medications   Medication Sig Start Date End Date Taking? Authorizing Provider  ACCU-CHEK SOFTCLIX LANCETS lancets USE AS DIRECTED DAILY UP TO THREE TIMES DAILY 05/25/17   Hoy Register, MD  albuterol (PROVENTIL HFA;VENTOLIN HFA) 108 (90 Base) MCG/ACT inhaler Inhale 2 puffs into the lungs every 6 (six) hours as needed for wheezing or shortness of breath. For wheezing 03/29/17   Hoy Register, MD  aspirin 81 MG EC tablet Take 1 tablet (81 mg total) by mouth daily. 05/25/16   Nyra Market, MD  Blood Glucose Monitoring Suppl (ACCU-CHEK AVIVA) device Use as instructed daily. Patient taking differently: 1 each by Other route 3 (three) times daily.  11/24/16   Hoy Register, MD  dicyclomine (BENTYL) 10 MG capsule TAKE ONE CAPSULE BY MOUTH THREE TIMES DAILY BEFORE MEALS 05/26/17   Hoy Register, MD  esomeprazole (NEXIUM) 20 MG capsule Take 1 capsule (20 mg total) by mouth daily. 03/29/17   Hoy Register, MD  fenofibrate (TRICOR) 48 MG tablet Take 1 tablet (48 mg total) by mouth daily. 08/04/17   Robbie Lis M, [name]  FLUoxetine (PROZAC) 40 MG capsule Take 1 capsule (40 mg total) by mouth at bedtime. 03/29/17   Hoy Register, MD  glimepiride (AMARYL) 4 MG tablet Take 2 tablets (8 mg total) by mouth daily with breakfast. 03/29/17   Hoy Register, MD  glucose blood (ACCU-CHEK AVIVA) test strip 1 each by Other route 3 (three) times daily. 03/29/17   Hoy Register, MD  hydrOXYzine (ATARAX/VISTARIL) 10 MG tablet Take 1 tablet (10 mg total)  by mouth daily. 03/29/17   Hoy Register, MD  Insulin Glargine (LANTUS SOLOSTAR) 100 UNIT/ML Solostar Pen Inject 20 Units into the skin daily at 10 pm. 05/25/17   Hoy Register, MD  Insulin Pen Needle 31G X 5 MM MISC 1 each by Does not apply route at bedtime. 03/29/17   Hoy Register, MD  Lancet Devices [facility]) lancets Use as instructed daily. Patient taking differently: 1 each by Other route 3 (three) times daily.  11/24/16   Hoy Register, MD  loperamide (IMODIUM) 2 MG capsule Take 4 mg by mouth 2 (two) times daily.    [provider]  meclizine (ANTIVERT) 25 MG tablet Take 1 tablet (25 mg total) by mouth 3 (three) times daily as needed for dizziness. 03/29/17   Hoy Register, MD  metoprolol succinate (TOPROL-XL) 25 MG 24  hr tablet Take 1 tablet (25 mg total) by mouth daily. 03/29/17   Hoy Register, MD  oxyCODONE-acetaminophen (PERCOCET/ROXICET) 5-325 MG tablet Take 1 tablet by mouth every 12 (twelve) hours as needed for severe pain. 03/02/17   [name], MD  pravastatin (PRAVACHOL) 40 MG tablet Take 1 tablet (40 mg total) by mouth daily. 03/29/17   Hoy Register, MD  tiotropium (SPIRIVA) 18 MCG inhalation capsule Place 1 capsule (18 mcg total) into inhaler and inhale daily. 03/29/17   Hoy Register, MD    Family History Family History  Problem Relation Age of Onset  . Lung cancer Father   . Heart attack Father 29       CABG x4  . Breast cancer Sister        Survivor     Social History Social History   Tobacco Use  . Smoking status: Light Tobacco Smoker    Packs/day: 0.00    Years: 20.00    Pack years: 0.00    Types: Cigarettes, E-cigarettes    Start date: 09/19/2016  . Smokeless tobacco: Never Used  Substance Use Topics  . Alcohol use: No    Alcohol/week: 0.0 oz  . Drug use: No     Allergies   Biaxin [clarithromycin]; Clarithromycin; Lisinopril; Sulfa antibiotics; Chlorthalidone; Daptomycin; Latex; Amoxicillin-pot clavulanate;  Aspirin; Cholestyramine; Dilaudid [hydromorphone hcl]; and Lasix [furosemide]   Review of Systems Review of Systems  Constitutional: Negative for chills, diaphoresis and fever.  HENT: Negative.   Respiratory: Positive for shortness of breath.   Cardiovascular: Positive for chest pain.  Gastrointestinal: Negative.  Negative for vomiting.  Musculoskeletal: Negative.   Skin: Negative.   Neurological: Positive for dizziness. Negative for syncope and weakness.     Physical Exam Updated Vital Signs BP (!) 157/90 (BP Location: Left Arm)   Pulse 89   Temp 98.5 F (36.9 C) (Oral)   Resp 18   Ht 5\' 7"  (1.702 m)   Wt 71.7 kg (158 lb)   LMP 07/24/2011   SpO2 99%   BMI 24.75 kg/m   Physical Exam  Constitutional: She appears well-developed and well-nourished.  HENT:  Head: Normocephalic.  TM's clear bilaterally. External canals unremarkable.   Neck: Normal range of motion. Neck supple.  Cardiovascular: Normal rate, regular rhythm and normal pulses.  No murmur heard. Pulmonary/Chest: Effort normal and breath sounds normal. She has no wheezes. She has no rhonchi. She has no rales.  Abdominal: Soft. Bowel sounds are normal. There is no tenderness. There is no rebound and no guarding.  Musculoskeletal: Normal range of motion.       Right lower leg: She exhibits no edema.       Left lower leg: She exhibits no edema.  Neurological: She is alert. No cranial nerve deficit.  Skin: Skin is warm and dry. No rash noted.  Psychiatric: She has a normal mood and affect.     ED Treatments / Results  Labs (all labs ordered are listed, but only abnormal results are displayed) Labs Reviewed  BASIC METABOLIC PANEL - Abnormal; Notable for the following components:      Result Value   Potassium 3.3 (*)    Glucose, Bld 177 (*)    BUN 5 (*)    All other components within normal limits  CBC - Abnormal; Notable for the following components:   WBC 10.7 (*)    RBC 5.41 (*)    Hemoglobin 16.0 (*)     HCT 46.2 (*)    Platelets  146 (*)    All other components within normal limits  TROPONIN I  I-STAT TROPONIN, ED    EKG  EKG Interpretation  Date/Time:  Monday August 29 2017 23:55:17 EST Ventricular Rate:  64 PR Interval:  158 QRS Duration: 124 QT Interval:  466 QTC Calculation: 480 R Axis:   -49 Text Interpretation:  Normal sinus rhythm Possible Left atrial enlargement Left axis deviation Left bundle branch block Abnormal ECG No significant change since last tracing Confirmed by Ross [name] ([id number]) on 08/30/2017 4:40:14 AM       Radiology Dg Chest 2 View  Result Date: 08/30/2017 CLINICAL DATA:  Left-sided chest pain. EXAM: CHEST  2 VIEW COMPARISON:  Radiographs 07/20/2017 FINDINGS: The cardiomediastinal contours are normal. The lungs are clear. Pulmonary vasculature is normal. No consolidation, pleural effusion, or pneumothorax. No acute osseous abnormalities are seen. IMPRESSION: No acute abnormality. Electronically Signed   By: Rubye Oaks M.D.   On: 08/30/2017 00:40    Procedures Procedures (including critical care time)  Medications Ordered in ED Medications - No data to display   Initial Impression / Assessment and Plan / ED Course  I have reviewed the triage vital signs and the nursing notes.  Pertinent labs & imaging results that were available during my care of the patient were reviewed by me and considered in my medical decision making (see chart for details).     Patient with significant risk factors for CAD presents with dizziness (like previous episodes Vertigo), chest pain and mild SOB.   EKG unchanged from previous. CXR clear. Troponin x 2 are negative. No current pain. VSS.   Chart reviewed. Stress test in February 2019 shows no ichemic changes, and also an improved EF from 27% in 2016 to 55-60%.   Discussed the patient's presentation with cardiology, Dr. Tiburcio Pea, who advises the patient can be discharged home and should follow up with Dr.  Elease Hashimoto for further evaluation/management.   The patient complains of a recurrent, itchy rash to the right UE, previously treated with steroids. Will provide 6-day taper dose prednisone and encourage return to her dermatologist.   Final Clinical Impressions(s) / ED Diagnoses   Final diagnoses:  None   1. Chest pain 2. Vertigo 3. Rash   ED Discharge Orders    None       Elpidio Anis, [name] 08/30/17 [id number]    Shon Baton, MD 08/30/17 [phone]

## 2017-08-30 NOTE — Discharge Instructions (Signed)
Continue your Meclizine for dizziness. Call Dr. Harvie Bridge office and schedule an appointment for recheck of chest pain in his office. Return to the emergency department if symptoms worsen or for new concern.

## 2017-08-30 NOTE — ED Notes (Signed)
Pt ambulated to the bathroom w/o any difficulties  

## 2017-09-05 ENCOUNTER — Telehealth: Payer: Self-pay | Admitting: Family Medicine

## 2017-09-05 NOTE — Telephone Encounter (Signed)
Received [facility] OV Note, fax will on the pcp in-box

## 2017-09-12 ENCOUNTER — Emergency Department (HOSPITAL_COMMUNITY): Payer: Medicaid Other

## 2017-09-12 ENCOUNTER — Encounter (HOSPITAL_COMMUNITY): Payer: Self-pay | Admitting: Emergency Medicine

## 2017-09-12 ENCOUNTER — Emergency Department (HOSPITAL_COMMUNITY)
Admission: EM | Admit: 2017-09-12 | Discharge: 2017-09-12 | Disposition: A | Discharge status: Home / Self Care | Payer: Medicaid Other | Attending: Emergency Medicine | Admitting: Emergency Medicine

## 2017-09-12 ENCOUNTER — Other Ambulatory Visit: Payer: Self-pay

## 2017-09-12 DIAGNOSIS — J449 Chronic obstructive pulmonary disease, unspecified: Secondary | ICD-10-CM | POA: Insufficient documentation

## 2017-09-12 DIAGNOSIS — F17210 Nicotine dependence, cigarettes, uncomplicated: Secondary | ICD-10-CM | POA: Diagnosis not present

## 2017-09-12 DIAGNOSIS — J069 Acute upper respiratory infection, unspecified: Secondary | ICD-10-CM | POA: Insufficient documentation

## 2017-09-12 DIAGNOSIS — I110 Hypertensive heart disease with heart failure: Secondary | ICD-10-CM | POA: Diagnosis not present

## 2017-09-12 DIAGNOSIS — I5022 Chronic systolic (congestive) heart failure: Secondary | ICD-10-CM | POA: Insufficient documentation

## 2017-09-12 DIAGNOSIS — Z7982 Long term (current) use of aspirin: Secondary | ICD-10-CM | POA: Diagnosis not present

## 2017-09-12 DIAGNOSIS — Z91040 Latex allergy status: Secondary | ICD-10-CM | POA: Diagnosis not present

## 2017-09-12 DIAGNOSIS — Z79899 Other long term (current) drug therapy: Secondary | ICD-10-CM | POA: Insufficient documentation

## 2017-09-12 DIAGNOSIS — Z794 Long term (current) use of insulin: Secondary | ICD-10-CM | POA: Insufficient documentation

## 2017-09-12 DIAGNOSIS — J441 Chronic obstructive pulmonary disease with (acute) exacerbation: Secondary | ICD-10-CM

## 2017-09-12 DIAGNOSIS — E119 Type 2 diabetes mellitus without complications: Secondary | ICD-10-CM | POA: Insufficient documentation

## 2017-09-12 DIAGNOSIS — R05 Cough: Secondary | ICD-10-CM | POA: Diagnosis present

## 2017-09-12 MED ORDER — ALBUTEROL SULFATE (2.5 MG/3ML) 0.083% IN NEBU
5.0000 mg | INHALATION_SOLUTION | Freq: Once | RESPIRATORY_TRACT | Status: AC
  Administered 2017-09-12: 5 mg via RESPIRATORY_TRACT
  Filled 2017-09-12: qty 6

## 2017-09-12 MED ORDER — BENZONATATE 100 MG PO CAPS: 100.0000 mg | ORAL_CAPSULE | Freq: Three times a day (TID) | ORAL | 0 refills | Status: DC

## 2017-09-12 MED ORDER — PREDNISONE 20 MG PO TABS: 40.0000 mg | ORAL_TABLET | Freq: Every day | ORAL | 0 refills | Status: DC

## 2017-09-12 MED ORDER — ALBUTEROL SULFATE HFA 108 (90 BASE) MCG/ACT IN AERS: 2.0000 | INHALATION_SPRAY | RESPIRATORY_TRACT | 3 refills | Status: DC | PRN

## 2017-09-12 NOTE — ED Notes (Signed)
Pt verbalized understanding of all d/c instructions, prescriptions, and f/u information. VSS. All belongings with patient at this time.  Pt ambulatory to lobby with steady gait.  

## 2017-09-12 NOTE — ED Notes (Signed)
D/t continuous cough and hx of COPD, neb tx started in triage

## 2017-09-12 NOTE — ED Notes (Signed)
ED Provider at bedside. 

## 2017-09-12 NOTE — Discharge Instructions (Signed)
Please take albuterol inhaler 2 puffs every 4 hours for the next 24 hours, then 2 puffs every 4 hours as needed Please take prednisone 40 mg by mouth daily for 5 days Please take Tessalon, 100 mg every 8 hours as needed for coughing Please stop taking TriCor immediately as this may be causing her muscle pain, if your pain continues within 48 hours or if it gets worse please stop the other medication PRAVASTATIN as well, ER for severe or worsening shortness of breath, high fevers or coughing

## 2017-09-12 NOTE — ED Triage Notes (Signed)
Pt presents with cough x 2-3 days, non productive. Worse at night.  Low grade fever last night. Pt has been visiting family in hospital frequently recently.  Pt has used MDI with no relief.

## 2017-09-12 NOTE — ED Notes (Signed)
Pt reports fever, chills, cough, and congestion x 3 days. Pt seen by ophthalmologist this AM and dx with eye infection. L eye redness noted. A&Ox4. Ambulatory to bed, NAD noted

## 2017-09-12 NOTE — ED Provider Notes (Signed)
[facility] EMERGENCY DEPARTMENT Provider Note   CSN: [id number] Arrival date & time: 09/12/17  1809     History   Chief Complaint Chief Complaint  Patient presents with  . Cough    HPI [name] is a 57 y.o. female.  HPI  The patient is a 57 year old female, she is a prior smoker and has known COPD as well as a history of chronic systolic heart failure.  She is a diabetic, she has high cholesterol, she has high blood pressure.  She presents to the hospital today complaining of coughing which is been going on for 4 days.  Initially she had fevers, chills, she feels like there is sputum in her chest but it will not come up.  She has had no significant relief with over-the-counter medications.  She also complains of some muscle aches in her bilateral thighs which have been going on for several days, this after being on a new cholesterol medication for the last month.  She has been on a statin for quite some time but recently started niacin  The patient reports that her family member was recently diagnosed with a COPD exacerbation and was admitted to the hospital with shortness of breath and she has been visiting her frequently.  She is unsure if she has been exposed to anybody with the flu.  Past Medical History:  Diagnosis Date  . Anxiety   . Arthritis   . Chronic systolic heart failure (HCC)    EF 20-25% ECHO 05/2015  . COPD (chronic obstructive pulmonary disease) (HCC)   . Depression   . Diabetes mellitus without complication (HCC)    Type II  . Dysrhythmia    pt unsure of name of arrythmia - " my heart rate will drop all of a sudden" -no current treatment - atrial flutter  . Fibrillation, atrial (HCC)   . Gallstones   . GERD (gastroesophageal reflux disease)   . Headache(784.0)    migraines  . Hypertension   . Osteomyelitis (HCC)    R ankle  . Rash    arms    Patient Active Problem List   Diagnosis Date Noted  . 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 Primary osteoarthritis of both hands 10/06/2016  . History of gastroesophageal reflux (GERD) 10/06/2016  . History of COPD 10/06/2016  . History of anxiety 10/06/2016  . History of CHF (congestive heart failure) 10/06/2016  . History of hypertension 10/06/2016  . History of cardiac dysrhythmia 10/06/2016  . Primary osteoarthritis of both feet 10/06/2016  . Primary osteoarthritis of both knees 10/06/2016  . History of infection/ ankle post operative  10/06/2016  . Rheumatoid factor positive 10/06/2016  . De Quervain's disease (radial styloid tenosynovitis) 06/09/2016  . Tobacco abuse 05/23/2016  . Hyperglycemia without ketosis   . Bleeding hemorrhoids 11/03/2015  . Acute blood loss anemia 11/03/2015  . Hematochezia 11/01/2015  . Back pain with sciatica 09/08/2015  . Infection of bone of ankle (HCC) 07/15/2015  . Chronic systolic CHF (congestive heart failure) (HCC) 06/24/2015  . Chest pain 06/03/2015  . Hypertension   . Anxiety   . Gallstones   . GERD (gastroesophageal reflux disease)   . COPD (chronic obstructive pulmonary disease) (HCC)   . Dysrhythmia   . Rash   . Depression   . Diabetes mellitus without complication (HCC)   . Post-operative state 12/10/2011    Past Surgical History:  Procedure Laterality Date  . ANKLE FRACTURE SURGERY Right 2015  .  CARDIAC CATHETERIZATION N/A 06/24/2015   Procedure: Left Heart Cath and Coronary Angiography;  Surgeon: [name], MD;  Location: [facility] INVASIVE CV LAB;  Service: Cardiovascular;  Laterality: N/A;  . CARPAL TUNNEL RELEASE     bil  . CHOLECYSTECTOMY  11/24/2011   Procedure: LAPAROSCOPIC CHOLECYSTECTOMY WITH INTRAOPERATIVE CHOLANGIOGRAM;  Surgeon: Clovis Pu. Cornett, MD;  Location: [facility] ORS;  Service: General;  Laterality: N/A;  Laparoscopic Cholecystectomy with Cholangiogram  . COLONOSCOPY    . ECTOPIC PREGNANCY SURGERY  many yrs ago  . HARDWARE REMOVAL Right 07/15/2015   Procedure: RIGHT ANKLE HARDWARE REMOVAL, PLACEMENT OF  STIMULAN ANTIBIOTIC BEADS AND PREVENA WOUND VAC.;  Surgeon: [name], MD;  Location: [facility] OR;  Service: Orthopedics;  Laterality: Right;  . TOOTH EXTRACTION N/A 12/03/2016   Procedure: DENTAL EXTRACTIONS with Alveoloplasty;  Surgeon: Ocie Doyne, DDS;  Location: [facility] OR;  Service: Oral Surgery;  Laterality: N/A;    OB History    Gravida Para Term Preterm AB Living   9       5 4    SAB TAB Ectopic Multiple Live Births   4   1   4        Home Medications    Prior to Admission medications   Medication Sig Start Date End Date Taking? Authorizing Provider  ACCU-CHEK SOFTCLIX LANCETS lancets USE AS DIRECTED DAILY UP TO THREE TIMES DAILY 05/25/17   Hoy Register, MD  albuterol (PROVENTIL HFA;VENTOLIN HFA) 108 (90 Base) MCG/ACT inhaler Inhale 2 puffs into the lungs every 4 (four) hours as needed for wheezing or shortness of breath. 09/12/17   Eber Hong, MD  aspirin 81 MG EC tablet Take 1 tablet (81 mg total) by mouth daily. 05/25/16   Nyra Market, MD  benzonatate (TESSALON) 100 MG capsule Take 1 capsule (100 mg total) by mouth every 8 (eight) hours. 09/12/17   Eber Hong, MD  Blood Glucose Monitoring Suppl (ACCU-CHEK AVIVA) device Use as instructed daily. Patient taking differently: 1 each by Other route 3 (three) times daily.  11/24/16   Hoy Register, MD  dicyclomine (BENTYL) 10 MG capsule TAKE ONE CAPSULE BY MOUTH THREE TIMES DAILY BEFORE MEALS 05/26/17   Hoy Register, MD  esomeprazole (NEXIUM) 20 MG capsule Take 1 capsule (20 mg total) by mouth daily. 03/29/17   Hoy Register, MD  FLUoxetine (PROZAC) 40 MG capsule Take 1 capsule (40 mg total) by mouth at bedtime. 03/29/17   Hoy Register, MD  glimepiride (AMARYL) 4 MG tablet Take 2 tablets (8 mg total) by mouth daily with breakfast. 03/29/17   Hoy Register, MD  glucose blood (ACCU-CHEK AVIVA) test strip 1 each by Other route 3 (three) times daily. 03/29/17   Hoy Register, MD  hydrOXYzine (ATARAX/VISTARIL) 10 MG  tablet Take 1 tablet (10 mg total) by mouth daily. 03/29/17   Hoy Register, MD  Insulin Glargine (LANTUS SOLOSTAR) 100 UNIT/ML Solostar Pen Inject 20 Units into the skin daily at 10 pm. 05/25/17   Hoy Register, MD  Insulin Pen Needle 31G X 5 MM MISC 1 each by Does not apply route at bedtime. 03/29/17   Hoy Register, MD  Lancet Devices [facility] lancets Use as instructed daily. Patient taking differently: 1 each by Other route 3 (three) times daily.  11/24/16   Hoy Register, MD  loperamide (IMODIUM) 2 MG capsule Take 4 mg by mouth 2 (two) times daily.    [provider]  meclizine (ANTIVERT) 25 MG tablet Take 1 tablet (25 mg total) by mouth  3 (three) times daily as needed for dizziness. 03/29/17   Hoy Register, MD  metoprolol succinate (TOPROL-XL) 25 MG 24 hr tablet Take 1 tablet (25 mg total) by mouth daily. 03/29/17   Hoy Register, MD  oxyCODONE-acetaminophen (PERCOCET/ROXICET) 5-325 MG tablet Take 1 tablet by mouth every 12 (twelve) hours as needed for severe pain. 03/02/17   [name], MD  pravastatin (PRAVACHOL) 40 MG tablet Take 1 tablet (40 mg total) by mouth daily. 03/29/17   Hoy Register, MD  predniSONE (DELTASONE) 20 MG tablet Take 2 tablets (40 mg total) by mouth daily. 09/12/17   Eber Hong, MD  tiotropium (SPIRIVA) 18 MCG inhalation capsule Place 1 capsule (18 mcg total) into inhaler and inhale daily. 03/29/17   Hoy Register, MD    Family History Family History  Problem Relation Age of Onset  . Lung cancer Father   . Heart attack Father 29       CABG x4  . Breast cancer Sister        Survivor     Social History Social History   Tobacco Use  . Smoking status: Light Tobacco Smoker    Packs/day: 0.00    Years: 20.00    Pack years: 0.00    Types: Cigarettes, E-cigarettes    Start date: 09/19/2016  . Smokeless tobacco: Never Used  Substance Use Topics  . Alcohol use: No    Alcohol/week: 0.0 oz  . Drug use: No      Allergies   Biaxin [clarithromycin]; Clarithromycin; Lisinopril; Sulfa antibiotics; Chlorthalidone; Daptomycin; Latex; Amoxicillin-pot clavulanate; Aspirin; Cholestyramine; Dilaudid [hydromorphone hcl]; and Lasix [furosemide]   Review of Systems Review of Systems  All other systems reviewed and are negative.    Physical Exam Updated Vital Signs BP 133/72 (BP Location: Right Arm)   Pulse 96   Temp 98.6 F (37 C) (Oral)   Resp (!) 22   Ht 5\' 7"  (1.702 m)   Wt 71.7 kg (158 lb)   LMP 07/24/2011   SpO2 99%   BMI 24.75 kg/m   Physical Exam  Constitutional: She appears well-developed and well-nourished. No distress.  HENT:  Head: Normocephalic and atraumatic.  Mouth/Throat: Oropharynx is clear and moist. No oropharyngeal exudate.  Eyes: Conjunctivae and EOM are normal. Pupils are equal, round, and reactive to light. Right eye exhibits no discharge. Left eye exhibits no discharge. No scleral icterus.  Neck: Normal range of motion. Neck supple. No JVD present. No thyromegaly present.  Cardiovascular: Normal rate, regular rhythm, normal heart sounds and intact distal pulses. Exam reveals no gallop and no friction rub.  No murmur heard. Pulmonary/Chest: Effort normal. No respiratory distress. She has wheezes. She has no rales.  The patient speaks in full sentences without any increased work of breathing or respiratory distress.  She does have diffuse expiratory wheezing in all lung fields.  There is no rales, occasional coughing.  Abdominal: Soft. Bowel sounds are normal. She exhibits no distension and no mass. There is no tenderness.  Musculoskeletal: Normal range of motion. She exhibits no edema or tenderness.  Lymphadenopathy:    She has no cervical adenopathy.  Neurological: She is alert. Coordination normal.  Skin: Skin is warm and dry. No rash noted. No erythema.  Psychiatric: She has a normal mood and affect. Her behavior is normal.  Nursing note and vitals  reviewed.    ED Treatments / Results  Labs (all labs ordered are listed, but only abnormal results are displayed) Labs Reviewed - No data to  display  EKG  EKG Interpretation  Date/Time:  Monday September 12 2017 20:10:12 EDT Ventricular Rate:  95 PR Interval:  146 QRS Duration: 116 QT Interval:  410 QTC Calculation: 515 R Axis:   -36 Text Interpretation:  Normal sinus rhythm Possible Left atrial enlargement Left axis deviation Left ventricular hypertrophy with QRS widening and repolarization abnormality Cannot rule out Septal infarct , age undetermined Prolonged QT Abnormal ECG since last tracing no significant change Confirmed by Eber Hong ([id number]) on 09/12/2017 9:37:04 PM       Radiology Dg Chest 2 View  Result Date: 09/12/2017 CLINICAL DATA:  Dry cough for 3 days, pain across upper chest and then under the breasts, low-grade fever last night, history hypertension, diabetes mellitus, pneumonia EXAM: CHEST - 2 VIEW COMPARISON:  08/30/2017 FINDINGS: Normal heart size, mediastinal contours, and pulmonary vascularity. Atherosclerotic calcification at aortic arch. Numerous EKG leads project over chest. No definite infiltrate, pleural effusion or pneumothorax. Bones unremarkable. IMPRESSION: No acute abnormalities. Electronically Signed   By: Ulyses Southward M.D.   On: 09/12/2017 20:25    Procedures Procedures (including critical care time)  Medications Ordered in ED Medications  albuterol (PROVENTIL) (2.5 MG/3ML) 0.083% nebulizer solution 5 mg (5 mg Nebulization Given 09/12/17 1951)     Initial Impression / Assessment and Plan / ED Course  I have reviewed the triage vital signs and the nursing notes.  Pertinent labs & imaging results that were available during my care of the patient were reviewed by me and considered in my medical decision making (see chart for details).    The x-ray reveals no signs of pneumonia pneumothorax or abnormal mediastinum suggesting that this is not a  pneumonia.  There is no signs of congestive heart failure on the x-ray either  The EKG shows no abnormal findings compared to the prior EKG though it is not normal at baseline.  I do not think that this patient is having a cardiac process but much more likely to be reactive airway disease in the presence of a respiratory illness such as the flu or other virus.  She felt better after albuterol, she will be treated at home with ongoing treatments of albuterol, prednisone and Tessalon.  She expressed understanding and agreement.  We will stop the cholesterol medications as these may be causing a myalgia for her.  She can follow-up in the outpatient setting.  Final Clinical Impressions(s) / ED Diagnoses   Final diagnoses:  COPD exacerbation (HCC)  Viral upper respiratory tract infection    ED Discharge Orders        Ordered    predniSONE (DELTASONE) 20 MG tablet  Daily     09/12/17 2143    benzonatate (TESSALON) 100 MG capsule  Every 8 hours     09/12/17 2143    albuterol (PROVENTIL HFA;VENTOLIN HFA) 108 (90 Base) MCG/ACT inhaler  Every 4 hours PRN     09/12/17 2143       Eber Hong, MD 09/12/17 2145

## 2017-09-13 ENCOUNTER — Other Ambulatory Visit: Payer: Self-pay | Admitting: Family Medicine

## 2017-09-13 DIAGNOSIS — J410 Simple chronic bronchitis: Secondary | ICD-10-CM

## 2017-09-15 ENCOUNTER — Other Ambulatory Visit: Payer: Medicaid Other

## 2017-10-08 ENCOUNTER — Emergency Department (HOSPITAL_COMMUNITY): Payer: Medicaid Other

## 2017-10-08 ENCOUNTER — Other Ambulatory Visit: Payer: Self-pay

## 2017-10-08 ENCOUNTER — Encounter (HOSPITAL_COMMUNITY): Payer: Self-pay | Admitting: Emergency Medicine

## 2017-10-08 ENCOUNTER — Emergency Department (HOSPITAL_COMMUNITY)
Admission: EM | Admit: 2017-10-08 | Discharge: 2017-10-08 | Disposition: A | Discharge status: Home / Self Care | Payer: Medicaid Other | Attending: Emergency Medicine | Admitting: Emergency Medicine

## 2017-10-08 DIAGNOSIS — E119 Type 2 diabetes mellitus without complications: Secondary | ICD-10-CM | POA: Diagnosis not present

## 2017-10-08 DIAGNOSIS — J449 Chronic obstructive pulmonary disease, unspecified: Secondary | ICD-10-CM | POA: Insufficient documentation

## 2017-10-08 DIAGNOSIS — F17210 Nicotine dependence, cigarettes, uncomplicated: Secondary | ICD-10-CM | POA: Diagnosis not present

## 2017-10-08 DIAGNOSIS — Z91040 Latex allergy status: Secondary | ICD-10-CM | POA: Diagnosis not present

## 2017-10-08 DIAGNOSIS — R05 Cough: Secondary | ICD-10-CM | POA: Diagnosis present

## 2017-10-08 DIAGNOSIS — M62838 Other muscle spasm: Secondary | ICD-10-CM | POA: Diagnosis not present

## 2017-10-08 DIAGNOSIS — I110 Hypertensive heart disease with heart failure: Secondary | ICD-10-CM | POA: Diagnosis not present

## 2017-10-08 DIAGNOSIS — Z79899 Other long term (current) drug therapy: Secondary | ICD-10-CM | POA: Diagnosis not present

## 2017-10-08 DIAGNOSIS — R058 Other specified cough: Secondary | ICD-10-CM

## 2017-10-08 DIAGNOSIS — I5022 Chronic systolic (congestive) heart failure: Secondary | ICD-10-CM | POA: Insufficient documentation

## 2017-10-08 DIAGNOSIS — Z7982 Long term (current) use of aspirin: Secondary | ICD-10-CM | POA: Diagnosis not present

## 2017-10-08 MED ORDER — CYCLOBENZAPRINE HCL 10 MG PO TABS: 10.0000 mg | ORAL_TABLET | Freq: Two times a day (BID) | ORAL | 0 refills | Status: DC | PRN

## 2017-10-08 MED ORDER — CYCLOBENZAPRINE HCL 10 MG PO TABS
10.0000 mg | ORAL_TABLET | Freq: Once | ORAL | Status: AC
  Administered 2017-10-08: 10 mg via ORAL
  Filled 2017-10-08: qty 1

## 2017-10-08 MED ORDER — FLUTICASONE PROPIONATE 50 MCG/ACT NA SUSP: 1.0000 | Freq: Every day | NASAL | 2 refills | Status: DC

## 2017-10-08 NOTE — ED Notes (Signed)
Pt verbalized understanding of d/c instructions and has no further questions, VSS, NAD.  

## 2017-10-08 NOTE — ED Provider Notes (Signed)
[facility] EMERGENCY DEPARTMENT Provider Note   CSN: [id number] Arrival date & time: 10/08/17  1445     History   Chief Complaint Chief Complaint  Patient presents with  . URI    HPI [name] is a 57 y.o. female.  The history is provided by the patient.  URI   This is a new problem. The current episode started more than 1 week ago. The problem has been gradually improving. There has been no fever. The fever has been present for less than 1 day. Associated symptoms include congestion, sinus pain and cough. Pertinent negatives include no chest pain, no abdominal pain, no diarrhea, no nausea, no vomiting, no dysuria, no ear pain, no headaches, no plugged ear sensation, no rhinorrhea, no sneezing, no sore throat, no swollen glands, no joint pain, no joint swelling, no neck pain, no rash and no wheezing. She has tried NSAIDs for the symptoms. The treatment provided mild relief.    Past Medical History:  Diagnosis Date  . Anxiety   . Arthritis   . Chronic systolic heart failure (HCC)    EF 20-25% ECHO 05/2015  . COPD (chronic obstructive pulmonary disease) (HCC)   . Depression   . Diabetes mellitus without complication (HCC)    Type II  . Dysrhythmia    pt unsure of name of arrythmia - " my heart rate will drop all of a sudden" -no current treatment - atrial flutter  . Fibrillation, atrial (HCC)   . Gallstones   . GERD (gastroesophageal reflux disease)   . Headache(784.0)    migraines  . Hypertension   . Osteomyelitis (HCC)    R ankle  . Rash    arms    Patient Active Problem List   Diagnosis Date Noted  . 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 Primary osteoarthritis of both hands 10/06/2016  . History of gastroesophageal reflux (GERD) 10/06/2016  . History of COPD 10/06/2016  . History of anxiety 10/06/2016  . History of CHF (congestive heart failure) 10/06/2016  . History of hypertension 10/06/2016  . History of cardiac dysrhythmia 10/06/2016  .  Primary osteoarthritis of both feet 10/06/2016  . Primary osteoarthritis of both knees 10/06/2016  . History of infection/ ankle post operative  10/06/2016  . Rheumatoid factor positive 10/06/2016  . De Quervain's disease (radial styloid tenosynovitis) 06/09/2016  . Tobacco abuse 05/23/2016  . Hyperglycemia without ketosis   . Bleeding hemorrhoids 11/03/2015  . Acute blood loss anemia 11/03/2015  . Hematochezia 11/01/2015  . Back pain with sciatica 09/08/2015  . Infection of bone of ankle (HCC) 07/15/2015  . Chronic systolic CHF (congestive heart failure) (HCC) 06/24/2015  . Chest pain 06/03/2015  . Hypertension   . Anxiety   . Gallstones   . GERD (gastroesophageal reflux disease)   . COPD (chronic obstructive pulmonary disease) (HCC)   . Dysrhythmia   . Rash   . Depression   . Diabetes mellitus without complication (HCC)   . Post-operative state 12/10/2011    Past Surgical History:  Procedure Laterality Date  . ANKLE FRACTURE SURGERY Right 2015  . CARDIAC CATHETERIZATION N/A 06/24/2015   Procedure: Left Heart Cath and Coronary Angiography;  Surgeon: [name], MD;  Location: [facility] INVASIVE CV LAB;  Service: Cardiovascular;  Laterality: N/A;  . CARPAL TUNNEL RELEASE     bil  . CHOLECYSTECTOMY  11/24/2011   Procedure: LAPAROSCOPIC CHOLECYSTECTOMY WITH INTRAOPERATIVE CHOLANGIOGRAM;  Surgeon: Clovis Pu. Cornett, MD;  Location: [facility] ORS;  Service: General;  Laterality: N/A;  Laparoscopic Cholecystectomy with Cholangiogram  . COLONOSCOPY    . ECTOPIC PREGNANCY SURGERY  many yrs ago  . HARDWARE REMOVAL Right 07/15/2015   Procedure: RIGHT ANKLE HARDWARE REMOVAL, PLACEMENT OF STIMULAN ANTIBIOTIC BEADS AND PREVENA WOUND VAC.;  Surgeon: [name], MD;  Location: [facility] OR;  Service: Orthopedics;  Laterality: Right;  . TOOTH EXTRACTION N/A 12/03/2016   Procedure: DENTAL EXTRACTIONS with Alveoloplasty;  Surgeon: Ocie Doyne, DDS;  Location: [facility] OR;  Service: Oral Surgery;  Laterality:  N/A;     OB History    Gravida  9   Para      Term      Preterm      AB  5   Living  4     SAB  4   TAB      Ectopic  1   Multiple      Live Births  4            Home Medications    Prior to Admission medications   Medication Sig Start Date End Date Taking? Authorizing Provider  ACCU-CHEK SOFTCLIX LANCETS lancets USE AS DIRECTED DAILY UP TO THREE TIMES DAILY 05/25/17   Hoy Register, MD  albuterol (PROVENTIL HFA;VENTOLIN HFA) 108 (90 Base) MCG/ACT inhaler Inhale 2 puffs into the lungs every 4 (four) hours as needed for wheezing or shortness of breath. 09/12/17   Eber Hong, MD  aspirin 81 MG EC tablet Take 1 tablet (81 mg total) by mouth daily. 05/25/16   Nyra Market, MD  benzonatate (TESSALON) 100 MG capsule Take 1 capsule (100 mg total) by mouth every 8 (eight) hours. 09/12/17   Eber Hong, MD  Blood Glucose Monitoring Suppl (ACCU-CHEK AVIVA) device Use as instructed daily. Patient taking differently: 1 each by Other route 3 (three) times daily.  11/24/16   Hoy Register, MD  cyclobenzaprine (FLEXERIL) 10 MG tablet Take 1 tablet (10 mg total) by mouth 2 (two) times daily as needed for muscle spasms. 10/08/17   Nancye Grumbine, DO  dicyclomine (BENTYL) 10 MG capsule TAKE ONE CAPSULE BY MOUTH THREE TIMES DAILY BEFORE MEALS 05/26/17   Hoy Register, MD  esomeprazole (NEXIUM) 20 MG capsule Take 1 capsule (20 mg total) by mouth daily. 03/29/17   Hoy Register, MD  FLUoxetine (PROZAC) 40 MG capsule Take 1 capsule (40 mg total) by mouth at bedtime. 03/29/17   Hoy Register, MD  fluticasone (FLONASE) 50 MCG/ACT nasal spray Place 1 spray into both nostrils daily. 10/08/17   Royetta Probus, DO  glimepiride (AMARYL) 4 MG tablet Take 2 tablets (8 mg total) by mouth daily with breakfast. 03/29/17   Hoy Register, MD  glucose blood (ACCU-CHEK AVIVA) test strip 1 each by Other route 3 (three) times daily. 03/29/17   Hoy Register, MD  hydrOXYzine (ATARAX/VISTARIL)  10 MG tablet Take 1 tablet (10 mg total) by mouth daily. 03/29/17   Hoy Register, MD  Insulin Glargine (LANTUS SOLOSTAR) 100 UNIT/ML Solostar Pen Inject 20 Units into the skin daily at 10 pm. 05/25/17   Hoy Register, MD  Insulin Pen Needle 31G X 5 MM MISC 1 each by Does not apply route at bedtime. 03/29/17   Hoy Register, MD  Lancet Devices [facility]) lancets Use as instructed daily. Patient taking differently: 1 each by Other route 3 (three) times daily.  11/24/16   Hoy Register, MD  loperamide (IMODIUM) 2 MG capsule Take 4 mg by mouth 2 (two) times daily.  [provider]  meclizine (ANTIVERT) 25 MG tablet Take 1 tablet (25 mg total) by mouth 3 (three) times daily as needed for dizziness. 03/29/17   Hoy Register, MD  metoprolol succinate (TOPROL-XL) 25 MG 24 hr tablet Take 1 tablet (25 mg total) by mouth daily. 03/29/17   Hoy Register, MD  oxyCODONE-acetaminophen (PERCOCET/ROXICET) 5-325 MG tablet Take 1 tablet by mouth every 12 (twelve) hours as needed for severe pain. 03/02/17   [name], MD  pravastatin (PRAVACHOL) 40 MG tablet Take 1 tablet (40 mg total) by mouth daily. 03/29/17   Hoy Register, MD  predniSONE (DELTASONE) 20 MG tablet Take 2 tablets (40 mg total) by mouth daily. 09/12/17   Eber Hong, MD  tiotropium (SPIRIVA) 18 MCG inhalation capsule Place 1 capsule (18 mcg total) into inhaler and inhale daily. 03/29/17   Hoy Register, MD    Family History Family History  Problem Relation Age of Onset  . Lung cancer Father   . Heart attack Father 33       CABG x4  . Breast cancer Sister        Survivor     Social History Social History   Tobacco Use  . Smoking status: Light Tobacco Smoker    Packs/day: 0.00    Years: 20.00    Pack years: 0.00    Types: Cigarettes, E-cigarettes    Start date: 09/19/2016  . Smokeless tobacco: Never Used  Substance Use Topics  . Alcohol use: No    Alcohol/week: 0.0 oz  . Drug use: No      Allergies   Biaxin [clarithromycin]; Clarithromycin; Lisinopril; Sulfa antibiotics; Chlorthalidone; Daptomycin; Latex; Amoxicillin-pot clavulanate; Aspirin; Cholestyramine; Dilaudid [hydromorphone hcl]; and Lasix [furosemide]   Review of Systems Review of Systems  Constitutional: Negative for chills and fever.  HENT: Positive for congestion and sinus pain. Negative for ear pain, rhinorrhea, sneezing and sore throat.   Eyes: Negative for pain and visual disturbance.  Respiratory: Positive for cough. Negative for shortness of breath and wheezing.   Cardiovascular: Negative for chest pain and palpitations.  Gastrointestinal: Negative for abdominal pain, diarrhea, nausea and vomiting.  Genitourinary: Negative for dysuria and hematuria.  Musculoskeletal: Negative for arthralgias, back pain, joint pain and neck pain.  Skin: Negative for color change and rash.  Neurological: Negative for seizures, syncope and headaches.  All other systems reviewed and are negative.    Physical Exam Updated Vital Signs BP 140/86 (BP Location: Right Arm)   Pulse 70   Temp 98.4 F (36.9 C) (Oral)   Resp 18   LMP 07/24/2011   SpO2 100%   Physical Exam  Constitutional: She appears well-developed and well-nourished. No distress.  HENT:  Head: Normocephalic and atraumatic.  Eyes: Conjunctivae are normal.  Neck: Neck supple.  Cardiovascular: Normal rate and regular rhythm.  No murmur heard. Pulmonary/Chest: Effort normal and breath sounds normal. No respiratory distress.  Abdominal: Soft. There is no tenderness.  Musculoskeletal: She exhibits tenderness (TTP over right side of posterior ribs). She exhibits no edema.  Neurological: She is alert.  Skin: Skin is warm and dry.  Psychiatric: She has a normal mood and affect.  Nursing note and vitals reviewed.    ED Treatments / Results  Labs (all labs ordered are listed, but only abnormal results are displayed) Labs Reviewed - No data to  display  EKG None  Radiology Dg Chest 2 View  Result Date: 10/08/2017 CLINICAL DATA:  Cough with back pain EXAM: CHEST - 2 VIEW  COMPARISON:  12/13/2017 FINDINGS: Normal heart size and mediastinal contours. Atherosclerotic calcification. There is no edema, consolidation, effusion, or pneumothorax. Presumed cholecystectomy clips. No acute osseous finding. IMPRESSION: No evidence of active disease. Electronically Signed   By: Marnee Spring M.D.   On: 10/08/2017 15:49    Procedures Procedures (including critical care time)  Medications Ordered in ED Medications  cyclobenzaprine (FLEXERIL) tablet 10 mg (has no administration in time range)     Initial Impression / Assessment and Plan / ED Course  I have reviewed the triage vital signs and the nursing notes.  Pertinent labs & imaging results that were available during my care of the patient were reviewed by me and considered in my medical decision making (see chart for details).     JOLEE CRITCHER is a 57 year old female with history of heart failure, anxiety, asthma who presents to the ED with cough, congestion.  Patient with unremarkable vitals.  No fever.  Patient states she had flulike symptoms several weeks ago but continues to have cough that is now nonproductive.  Patient states that she feels like her sinuses are stuffed up.  Patient denies any chest pain, shortness of breath, wheezing.  Patient has been taken Motrin with some relief.  Patient states that cough and makes her back hurt.  Patient has clear breath sounds on exam.  Overall well-appearing. No signs of volume overload on exam.  Patient is tender over the right side of her posterior ribs likely from muscle spasm from coughing.  Patient likely with post viral cough.  Patient was concerned about possibly have a pneumonia but chest x-ray shows no signs of pneumonia, pneumothorax, pleural effusion.  Will give patient Flexeril.  Recommend continued use of Tylenol/Motrin.  Patient  already hasTessalon pearls.  Will give patient a prescription for Flonase for allergies, sinus congestion.  Patient discharged from ED in good condition and recommend follow-up with primary care provider.  Final Clinical Impressions(s) / ED Diagnoses   Final diagnoses:  Post-viral cough syndrome  Muscle spasm    ED Discharge Orders        Ordered    cyclobenzaprine (FLEXERIL) 10 MG tablet  2 times daily PRN     10/08/17 1733    fluticasone (FLONASE) 50 MCG/ACT nasal spray  Daily     10/08/17 1733       Virgina Norfolk, DO 10/08/17 1734    Blane Ohara, MD 10/09/17 [id number]

## 2017-10-08 NOTE — ED Notes (Signed)
EDP at bedside  

## 2017-10-08 NOTE — ED Triage Notes (Signed)
Patient presents to ED for assessment of right back pain "from my neck down to my butt".  Patient states 1 month hx of flu and then URI type symptoms.  PAtient actively coughing in triage, wet, productive cough with brown-ish phlegm.  PAtient states she does still smoke currently daiily.

## 2017-10-18 ENCOUNTER — Ambulatory Visit (INDEPENDENT_AMBULATORY_CARE_PROVIDER_SITE_OTHER): Payer: Medicaid Other

## 2017-10-18 ENCOUNTER — Telehealth (INDEPENDENT_AMBULATORY_CARE_PROVIDER_SITE_OTHER): Payer: Self-pay | Admitting: Orthopedic Surgery

## 2017-10-18 ENCOUNTER — Encounter (INDEPENDENT_AMBULATORY_CARE_PROVIDER_SITE_OTHER): Payer: Self-pay | Admitting: Orthopedic Surgery

## 2017-10-18 ENCOUNTER — Ambulatory Visit (INDEPENDENT_AMBULATORY_CARE_PROVIDER_SITE_OTHER): Payer: Medicaid Other | Admitting: Orthopedic Surgery

## 2017-10-18 ENCOUNTER — Other Ambulatory Visit: Payer: Self-pay | Admitting: Family Medicine

## 2017-10-18 DIAGNOSIS — M79645 Pain in left finger(s): Secondary | ICD-10-CM | POA: Diagnosis not present

## 2017-10-18 DIAGNOSIS — M65312 Trigger thumb, left thumb: Secondary | ICD-10-CM

## 2017-10-18 DIAGNOSIS — Z794 Long term (current) use of insulin: Principal | ICD-10-CM

## 2017-10-18 DIAGNOSIS — G8929 Other chronic pain: Secondary | ICD-10-CM

## 2017-10-18 DIAGNOSIS — E1165 Type 2 diabetes mellitus with hyperglycemia: Secondary | ICD-10-CM

## 2017-10-18 MED ORDER — ACETAMINOPHEN-CODEINE #3 300-30 MG PO TABS: 1.0000 | ORAL_TABLET | Freq: Three times a day (TID) | ORAL | 0 refills | Status: DC | PRN

## 2017-10-18 NOTE — Progress Notes (Signed)
Office Visit Note   Patient: [name]           Date of Birth: Jun 17, 1961           MRN: [id number] Visit Date: 10/18/2017 Requested by: Hoy Register, MD [address] [place], [place] [address] PCP: Hoy Register, MD  Subjective: Chief Complaint  Patient presents with  . Left Hand - Pain    HPI: [name] is a 57 year old patient with 1 month history of left trigger thumb.  This is triggering on a daily basis and interfering with her activities of daily living.  She has had 3 other trigger digit releases.  We discussed operative and nonoperative treatment options.  She is been taking some over-the-counter medication without much relief.  She is recently gone on disability because of vision issues.              ROS: All systems reviewed are negative as they relate to the chief complaint within the history of present illness.  Patient denies  fevers or chills.   Assessment & Plan: Visit Diagnoses:  1. Chronic pain of left thumb   2. Pain of left thumb   3. Trigger thumb, left thumb     Plan: Impression is left trigger thumb.  Based on the history of failed conservative treatment measures for her other digits I think with constant triggering on a daily basis that trigger thumb release is indicated.  Risk and benefits are discussed including not limited to infection nerve vessel damage delayed healing and potential need for more surgery.  All questions answered.  Follow-Up Instructions: No follow-ups on file.   Orders:  Orders Placed This Encounter  Procedures  . XR Finger Thumb Left   No orders of the defined types were placed in this encounter.     Procedures: No procedures performed   Clinical Data: No additional findings.  Objective: Vital Signs: LMP 07/24/2011   Physical Exam:   Constitutional: Patient appears well-developed HEENT:  Head: Normocephalic Eyes:EOM are normal Neck: Normal range of motion Cardiovascular: Normal rate Pulmonary/chest:  Effort normal Neurologic: Patient is alert Skin: Skin is warm Psychiatric: Patient has normal mood and affect    Ortho Exam: Orthopedic examination demonstrates tenderness over the A1 pulley left thumb.  Triggering is present with flexion and extension.  EPL FPL intact in the thumb.  Skin is intact in the thumb region.  Radial pulses intact.  Wrist range of motion full.  Specialty Comments:  No specialty comments available.  Imaging: Xr Finger Thumb Left  Result Date: 10/18/2017 3 views left thumb reviewed.  No arthritis is present.  No fracture or dislocation is present.  CMC joint normal.  Normal left thumb    PMFS History: Patient Active Problem List   Diagnosis Date Noted  . 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 Primary osteoarthritis of both hands 10/06/2016  . History of gastroesophageal reflux (GERD) 10/06/2016  . History of COPD 10/06/2016  . History of anxiety 10/06/2016  . History of CHF (congestive heart failure) 10/06/2016  . History of hypertension 10/06/2016  . History of cardiac dysrhythmia 10/06/2016  . Primary osteoarthritis of both feet 10/06/2016  . Primary osteoarthritis of both knees 10/06/2016  . History of infection/ ankle post operative  10/06/2016  . Rheumatoid factor positive 10/06/2016  . De Quervain's disease (radial styloid tenosynovitis) 06/09/2016  . Tobacco abuse 05/23/2016  . Hyperglycemia without ketosis   . Bleeding hemorrhoids 11/03/2015  . Acute blood loss anemia 11/03/2015  .  Hematochezia 11/01/2015  . Back pain with sciatica 09/08/2015  . Infection of bone of ankle (HCC) 07/15/2015  . Chronic systolic CHF (congestive heart failure) (HCC) 06/24/2015  . Chest pain 06/03/2015  . Hypertension   . Anxiety   . Gallstones   . GERD (gastroesophageal reflux disease)   . COPD (chronic obstructive pulmonary disease) (HCC)   . Dysrhythmia   . Rash   . Depression   . Diabetes mellitus without complication (HCC)   . Post-operative state  12/10/2011   Past Medical History:  Diagnosis Date  . Anxiety   . Arthritis   . Chronic systolic heart failure (HCC)    EF 20-25% ECHO 05/2015  . COPD (chronic obstructive pulmonary disease) (HCC)   . Depression   . Diabetes mellitus without complication (HCC)    Type II  . Dysrhythmia    pt unsure of name of arrythmia - " my heart rate will drop all of a sudden" -no current treatment - atrial flutter  . Fibrillation, atrial (HCC)   . Gallstones   . GERD (gastroesophageal reflux disease)   . Headache(784.0)    migraines  . Hypertension   . Osteomyelitis (HCC)    R ankle  . Rash    arms    Family History  Problem Relation Age of Onset  . Lung cancer Father   . Heart attack Father 6       CABG x4  . Breast cancer Sister        Survivor     Past Surgical History:  Procedure Laterality Date  . ANKLE FRACTURE SURGERY Right 2015  . CARDIAC CATHETERIZATION N/A 06/24/2015   Procedure: Left Heart Cath and Coronary Angiography;  Surgeon: [name], MD;  Location: [facility] INVASIVE CV LAB;  Service: Cardiovascular;  Laterality: N/A;  . CARPAL TUNNEL RELEASE     bil  . CHOLECYSTECTOMY  11/24/2011   Procedure: LAPAROSCOPIC CHOLECYSTECTOMY WITH INTRAOPERATIVE CHOLANGIOGRAM;  Surgeon: Clovis Pu. Cornett, MD;  Location: [facility] ORS;  Service: General;  Laterality: N/A;  Laparoscopic Cholecystectomy with Cholangiogram  . COLONOSCOPY    . ECTOPIC PREGNANCY SURGERY  many yrs ago  . HARDWARE REMOVAL Right 07/15/2015   Procedure: RIGHT ANKLE HARDWARE REMOVAL, PLACEMENT OF STIMULAN ANTIBIOTIC BEADS AND PREVENA WOUND VAC.;  Surgeon: [name], MD;  Location: [facility] OR;  Service: Orthopedics;  Laterality: Right;  . TOOTH EXTRACTION N/A 12/03/2016   Procedure: DENTAL EXTRACTIONS with Alveoloplasty;  Surgeon: Ocie Doyne, DDS;  Location: [facility] OR;  Service: Oral Surgery;  Laterality: N/A;   Social History   Occupational History  . Not on file  Tobacco Use  . Smoking status: Light Tobacco Smoker      Packs/day: 0.00    Years: 20.00    Pack years: 0.00    Types: Cigarettes, E-cigarettes    Start date: 09/19/2016  . Smokeless tobacco: Never Used  Substance and Sexual Activity  . Alcohol use: No    Alcohol/week: 0.0 oz  . Drug use: No  . Sexual activity: Not Currently    Birth control/protection: Post-menopausal

## 2017-10-18 NOTE — Telephone Encounter (Signed)
Best to use the Percocet after surgery okay to try Norco instead of the Tylenol 3 because of the itching problem

## 2017-10-18 NOTE — Telephone Encounter (Signed)
Patient called stated she was here today and script was written for Tylenol 3. Patient stated that makes her itch to death. Wants to know if she can get script for the Percocet instead.  Please call patient to advise.

## 2017-10-18 NOTE — Addendum Note (Signed)
Addended by: Shonna Chock on: 10/18/2017 04:36 PM   Modules accepted: Orders

## 2017-10-18 NOTE — Telephone Encounter (Signed)
Please advise thanks. d

## 2017-10-18 NOTE — Telephone Encounter (Signed)
IC advised. She stated she will try to T#3 with benadryl first.

## 2017-10-21 ENCOUNTER — Other Ambulatory Visit: Payer: Self-pay | Admitting: Family Medicine

## 2017-10-21 DIAGNOSIS — K580 Irritable bowel syndrome with diarrhea: Secondary | ICD-10-CM

## 2017-10-29 ENCOUNTER — Other Ambulatory Visit: Payer: Self-pay | Admitting: Family Medicine

## 2017-10-29 DIAGNOSIS — E1165 Type 2 diabetes mellitus with hyperglycemia: Secondary | ICD-10-CM

## 2017-10-29 DIAGNOSIS — Z794 Long term (current) use of insulin: Principal | ICD-10-CM

## 2017-10-31 DIAGNOSIS — M65312 Trigger thumb, left thumb: Secondary | ICD-10-CM | POA: Diagnosis not present

## 2017-11-07 ENCOUNTER — Other Ambulatory Visit: Payer: Self-pay | Admitting: Family Medicine

## 2017-11-07 DIAGNOSIS — Z8719 Personal history of other diseases of the digestive system: Secondary | ICD-10-CM

## 2017-11-09 ENCOUNTER — Ambulatory Visit (INDEPENDENT_AMBULATORY_CARE_PROVIDER_SITE_OTHER): Payer: Medicaid Other | Admitting: Orthopedic Surgery

## 2017-11-09 ENCOUNTER — Encounter (INDEPENDENT_AMBULATORY_CARE_PROVIDER_SITE_OTHER): Payer: Self-pay | Admitting: Orthopedic Surgery

## 2017-11-09 DIAGNOSIS — M65312 Trigger thumb, left thumb: Secondary | ICD-10-CM

## 2017-11-09 NOTE — Progress Notes (Signed)
Post-Op Visit Note   Patient: [name]           Date of Birth: [birth year]           MRN: [id number] Visit Date: 11/09/2017 PCP: Hoy Register, MD   Assessment & Plan:  Chief Complaint:  Chief Complaint  Patient presents with  . Left Hand - Routine Post Op   Visit Diagnoses:  1. Trigger thumb, left thumb     Plan: Tornero is a patient is now about 9 days out left trigger thumb release.  She is been moving the thumb.  She has slight numbness both on the ulnar aspect of the hand as well as on the radial aspect of the thumb.  That is improving.  Sutures are removed today Steri-Strips applied.  I want her to avoid any real stretching at that incision line.  I will see her back as needed.  Follow-Up Instructions: Return if symptoms worsen or fail to improve.   Orders:  No orders of the defined types were placed in this encounter.  No orders of the defined types were placed in this encounter.   Imaging: No results found.  PMFS History: Patient Active Problem List   Diagnosis Date Noted  . 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 Primary osteoarthritis of both hands 10/06/2016  . History of gastroesophageal reflux (GERD) 10/06/2016  . History of COPD 10/06/2016  . History of anxiety 10/06/2016  . History of CHF (congestive heart failure) 10/06/2016  . History of hypertension 10/06/2016  . History of cardiac dysrhythmia 10/06/2016  . Primary osteoarthritis of both feet 10/06/2016  . Primary osteoarthritis of both knees 10/06/2016  . History of infection/ ankle post operative  10/06/2016  . Rheumatoid factor positive 10/06/2016  . De Quervain's disease (radial styloid tenosynovitis) 06/09/2016  . Tobacco abuse 05/23/2016  . Hyperglycemia without ketosis   . Bleeding hemorrhoids 11/03/2015  . Acute blood loss anemia 11/03/2015  . Hematochezia 11/01/2015  . Back pain with sciatica 09/08/2015  . Infection of bone of ankle (HCC) 07/15/2015  . Chronic systolic CHF (congestive  heart failure) (HCC) 06/24/2015  . Chest pain 06/03/2015  . Hypertension   . Anxiety   . Gallstones   . GERD (gastroesophageal reflux disease)   . COPD (chronic obstructive pulmonary disease) (HCC)   . Dysrhythmia   . Rash   . Depression   . Diabetes mellitus without complication (HCC)   . Post-operative state 12/10/2011   Past Medical History:  Diagnosis Date  . Anxiety   . Arthritis   . Chronic systolic heart failure (HCC)    EF 20-25% ECHO 05/2015  . COPD (chronic obstructive pulmonary disease) (HCC)   . Depression   . Diabetes mellitus without complication (HCC)    Type II  . Dysrhythmia    pt unsure of name of arrythmia - " my heart rate will drop all of a sudden" -no current treatment - atrial flutter  . Fibrillation, atrial (HCC)   . Gallstones   . GERD (gastroesophageal reflux disease)   . Headache(784.0)    migraines  . Hypertension   . Osteomyelitis (HCC)    R ankle  . Rash    arms    Family History  Problem Relation Age of Onset  . Lung cancer Father   . Heart attack Father 73       CABG x4  . Breast cancer Sister        Survivor     Past Surgical  History:  Procedure Laterality Date  . ANKLE FRACTURE SURGERY Right 2015  . CARDIAC CATHETERIZATION N/A 06/24/2015   Procedure: Left Heart Cath and Coronary Angiography;  Surgeon: [name], MD;  Location: [facility] INVASIVE CV LAB;  Service: Cardiovascular;  Laterality: N/A;  . CARPAL TUNNEL RELEASE     bil  . CHOLECYSTECTOMY  11/24/2011   Procedure: LAPAROSCOPIC CHOLECYSTECTOMY WITH INTRAOPERATIVE CHOLANGIOGRAM;  Surgeon: Clovis Pu. Cornett, MD;  Location: [facility] ORS;  Service: General;  Laterality: N/A;  Laparoscopic Cholecystectomy with Cholangiogram  . COLONOSCOPY    . ECTOPIC PREGNANCY SURGERY  many yrs ago  . HARDWARE REMOVAL Right 07/15/2015   Procedure: RIGHT ANKLE HARDWARE REMOVAL, PLACEMENT OF STIMULAN ANTIBIOTIC BEADS AND PREVENA WOUND VAC.;  Surgeon: [name], MD;  Location: [facility] OR;  Service:  Orthopedics;  Laterality: Right;  . TOOTH EXTRACTION N/A 12/03/2016   Procedure: DENTAL EXTRACTIONS with Alveoloplasty;  Surgeon: Ocie Doyne, DDS;  Location: [facility] OR;  Service: Oral Surgery;  Laterality: N/A;   Social History   Occupational History  . Not on file  Tobacco Use  . Smoking status: Light Tobacco Smoker    Packs/day: 0.00    Years: 20.00    Pack years: 0.00    Types: Cigarettes, E-cigarettes    Start date: 09/19/2016  . Smokeless tobacco: Never Used  Substance and Sexual Activity  . Alcohol use: No    Alcohol/week: 0.0 oz  . Drug use: No  . Sexual activity: Not Currently    Birth control/protection: Post-menopausal

## 2017-11-17 ENCOUNTER — Other Ambulatory Visit: Payer: Self-pay | Admitting: Family Medicine

## 2017-11-17 DIAGNOSIS — F419 Anxiety disorder, unspecified: Secondary | ICD-10-CM

## 2017-11-17 DIAGNOSIS — L299 Pruritus, unspecified: Secondary | ICD-10-CM

## 2017-12-15 ENCOUNTER — Other Ambulatory Visit: Payer: Self-pay | Admitting: Cardiology

## 2017-12-15 ENCOUNTER — Other Ambulatory Visit: Payer: Self-pay | Admitting: Family Medicine

## 2017-12-15 DIAGNOSIS — Z794 Long term (current) use of insulin: Principal | ICD-10-CM

## 2017-12-15 DIAGNOSIS — E1165 Type 2 diabetes mellitus with hyperglycemia: Secondary | ICD-10-CM

## 2018-01-12 ENCOUNTER — Other Ambulatory Visit: Payer: Self-pay | Admitting: Cardiology

## 2018-01-12 ENCOUNTER — Other Ambulatory Visit: Payer: Self-pay | Admitting: Family Medicine

## 2018-01-12 DIAGNOSIS — Z794 Long term (current) use of insulin: Principal | ICD-10-CM

## 2018-01-12 DIAGNOSIS — E1165 Type 2 diabetes mellitus with hyperglycemia: Secondary | ICD-10-CM

## 2018-03-07 ENCOUNTER — Other Ambulatory Visit: Payer: Self-pay | Admitting: Family Medicine

## 2018-03-07 DIAGNOSIS — J410 Simple chronic bronchitis: Secondary | ICD-10-CM

## 2018-03-09 ENCOUNTER — Other Ambulatory Visit: Payer: Self-pay | Admitting: Family Medicine

## 2018-03-09 DIAGNOSIS — E1165 Type 2 diabetes mellitus with hyperglycemia: Secondary | ICD-10-CM

## 2018-03-09 DIAGNOSIS — Z794 Long term (current) use of insulin: Principal | ICD-10-CM

## 2018-03-22 ENCOUNTER — Other Ambulatory Visit: Payer: Self-pay | Admitting: Family Medicine

## 2018-03-22 DIAGNOSIS — Z794 Long term (current) use of insulin: Principal | ICD-10-CM

## 2018-03-22 DIAGNOSIS — E1165 Type 2 diabetes mellitus with hyperglycemia: Secondary | ICD-10-CM

## 2018-04-17 ENCOUNTER — Encounter (HOSPITAL_COMMUNITY): Payer: Self-pay | Admitting: *Deleted

## 2018-04-17 ENCOUNTER — Inpatient Hospital Stay (EMERGENCY_DEPARTMENT_HOSPITAL)
Admission: AD | Admit: 2018-04-17 | Discharge: 2018-04-18 | Disposition: A | Discharge status: Home / Self Care | Payer: Medicaid Other | Source: Ambulatory Visit | Attending: Obstetrics and Gynecology | Admitting: Obstetrics and Gynecology

## 2018-04-17 DIAGNOSIS — R350 Frequency of micturition: Secondary | ICD-10-CM | POA: Diagnosis not present

## 2018-04-17 DIAGNOSIS — Z91040 Latex allergy status: Secondary | ICD-10-CM | POA: Diagnosis not present

## 2018-04-17 DIAGNOSIS — R739 Hyperglycemia, unspecified: Secondary | ICD-10-CM

## 2018-04-17 DIAGNOSIS — R1084 Generalized abdominal pain: Secondary | ICD-10-CM

## 2018-04-17 DIAGNOSIS — R300 Dysuria: Secondary | ICD-10-CM | POA: Diagnosis not present

## 2018-04-17 DIAGNOSIS — F17210 Nicotine dependence, cigarettes, uncomplicated: Secondary | ICD-10-CM | POA: Diagnosis not present

## 2018-04-17 DIAGNOSIS — I5022 Chronic systolic (congestive) heart failure: Secondary | ICD-10-CM | POA: Diagnosis not present

## 2018-04-17 DIAGNOSIS — J449 Chronic obstructive pulmonary disease, unspecified: Secondary | ICD-10-CM | POA: Diagnosis not present

## 2018-04-17 DIAGNOSIS — Z79899 Other long term (current) drug therapy: Secondary | ICD-10-CM | POA: Diagnosis not present

## 2018-04-17 DIAGNOSIS — I110 Hypertensive heart disease with heart failure: Secondary | ICD-10-CM | POA: Diagnosis not present

## 2018-04-17 DIAGNOSIS — E1165 Type 2 diabetes mellitus with hyperglycemia: Secondary | ICD-10-CM | POA: Diagnosis not present

## 2018-04-17 DIAGNOSIS — Z794 Long term (current) use of insulin: Secondary | ICD-10-CM | POA: Diagnosis not present

## 2018-04-17 LAB — COMPREHENSIVE METABOLIC PANEL
ALT: 37 U/L (ref 0–44)
AST: 32 U/L (ref 15–41)
Albumin: 4 g/dL (ref 3.5–5.0)
Alkaline Phosphatase: 131 U/L — ABNORMAL HIGH (ref 38–126)
Anion gap: 12 (ref 5–15)
BUN: 8 mg/dL (ref 6–20)
CO2: 23 mmol/L (ref 22–32)
Calcium: 9 mg/dL (ref 8.9–10.3)
Chloride: 95 mmol/L — ABNORMAL LOW (ref 98–111)
Creatinine, Ser: 0.54 mg/dL (ref 0.44–1.00)
GFR calc Af Amer: >60 mL/min (ref >60)
GFR calc non Af Amer: >60 mL/min (ref >60)
Glucose, Bld: 519 mg/dL (ref 70–99)
Potassium: 3.8 mmol/L (ref 3.5–5.1)
Sodium: 130 mmol/L — ABNORMAL LOW (ref 135–145)
Total Bilirubin: 0.5 mg/dL (ref 0.3–1.2)
Total Protein: 7.3 g/dL (ref 6.5–8.1)

## 2018-04-17 LAB — URINALYSIS, ROUTINE W REFLEX MICROSCOPIC
Bacteria, UA: NONE SEEN
Bilirubin Urine: NEGATIVE
Glucose, UA: >=500 mg/dL — AB
Hgb urine dipstick: NEGATIVE
Ketones, ur: NEGATIVE mg/dL
Leukocytes, UA: NEGATIVE
Nitrite: NEGATIVE
PH: 6 (ref 5.0–8.0)
PROTEIN: NEGATIVE mg/dL
Specific Gravity, Urine: 1.036 — ABNORMAL HIGH (ref 1.005–1.030)

## 2018-04-17 LAB — CBC
HCT: 46.4 % — ABNORMAL HIGH (ref 36.0–46.0)
Hemoglobin: 15.8 g/dL — ABNORMAL HIGH (ref 12.0–15.0)
MCH: 28.9 pg (ref 26.0–34.0)
MCHC: 34.1 g/dL (ref 30.0–36.0)
MCV: 84.8 fL (ref 80.0–100.0)
Platelets: 152 10*3/uL (ref 150–400)
RBC: 5.47 MIL/uL — ABNORMAL HIGH (ref 3.87–5.11)
RDW: 12.9 % (ref 11.5–15.5)
WBC: 9.8 10*3/uL (ref 4.0–10.5)
nRBC: 0 % (ref 0.0–0.2)

## 2018-04-17 LAB — POCT PREGNANCY, URINE: Preg Test, Ur: NEGATIVE

## 2018-04-17 NOTE — MAU Provider Note (Signed)
History     CSN: [id number]  Arrival date and time: 04/17/18 1936  Chief Complaint  Patient presents with  . Dysuria  . Back Pain   HPI  [name] is a 57 y.o. G9P4 who presents to MAU with complaints of dysuria, frequency and back pain. She reports symptoms started occurring for the past 2 weeks. She believes she has a UTI. She reports voiding multiple times a hour which has caused her perineum to be itchy. She is a type 2 diabetic and has been unable to take medication in 2 months due to running out of medication. She has called [facility] for an appointment but they are unable to get her in to be seen. Hx of hypertension which she has also not been taking medication for.   OB History    Gravida  9   Para      Term      Preterm      AB  5   Living  4     SAB  4   TAB      Ectopic  1   Multiple      Live Births  4           Past Medical History:  Diagnosis Date  . Anxiety   . Arthritis   . Chronic systolic heart failure (HCC)    EF 20-25% ECHO 05/2015  . COPD (chronic obstructive pulmonary disease) (HCC)   . Depression   . Diabetes mellitus without complication (HCC)    Type II  . Dysrhythmia    pt unsure of name of arrythmia - " my heart rate will drop all of a sudden" -no current treatment - atrial flutter  . Fibrillation, atrial (HCC)   . Gallstones   . GERD (gastroesophageal reflux disease)   . Headache(784.0)    migraines  . Hypertension   . Osteomyelitis (HCC)    R ankle  . Rash    arms    Past Surgical History:  Procedure Laterality Date  . ANKLE FRACTURE SURGERY Right 2015  . CARDIAC CATHETERIZATION N/A 06/24/2015   Procedure: Left Heart Cath and Coronary Angiography;  Surgeon: [name], MD;  Location: [facility] INVASIVE CV LAB;  Service: Cardiovascular;  Laterality: N/A;  . CARPAL TUNNEL RELEASE     bil  . CHOLECYSTECTOMY  11/24/2011   Procedure: LAPAROSCOPIC CHOLECYSTECTOMY WITH INTRAOPERATIVE CHOLANGIOGRAM;   Surgeon: Clovis Pu. Cornett, MD;  Location: [facility] ORS;  Service: General;  Laterality: N/A;  Laparoscopic Cholecystectomy with Cholangiogram  . COLONOSCOPY    . ECTOPIC PREGNANCY SURGERY  many yrs ago  . HARDWARE REMOVAL Right 07/15/2015   Procedure: RIGHT ANKLE HARDWARE REMOVAL, PLACEMENT OF STIMULAN ANTIBIOTIC BEADS AND PREVENA WOUND VAC.;  Surgeon: [name], MD;  Location: [facility] OR;  Service: Orthopedics;  Laterality: Right;  . TOOTH EXTRACTION N/A 12/03/2016   Procedure: DENTAL EXTRACTIONS with Alveoloplasty;  Surgeon: Ocie Doyne, DDS;  Location: [facility] OR;  Service: Oral Surgery;  Laterality: N/A;    Family History  Problem Relation Age of Onset  . Lung cancer Father   . Heart attack Father 80       CABG x4  . Breast cancer Sister        Survivor     Social History   Tobacco Use  . Smoking status: Light Tobacco Smoker    Packs/day: 0.00    Years: 20.00    Pack years: 0.00    Types: Cigarettes,  E-cigarettes    Start date: 09/19/2016  . Smokeless tobacco: Never Used  Substance Use Topics  . Alcohol use: No    Alcohol/week: 0.0 standard drinks  . Drug use: No    Allergies:  Allergies  Allergen Reactions  . Biaxin [Clarithromycin] Anaphylaxis and Swelling  . Clarithromycin Shortness Of Breath and Swelling  . Lisinopril Swelling and Cough    Lip edema  . Sulfa Antibiotics Anaphylaxis, Shortness Of Breath, Swelling and Hypertension  . Chlorthalidone Nausea And Vomiting and Other (See Comments)    Clammy, Tachycardia, Headache   . Daptomycin Nausea And Vomiting  . Latex Hives and Rash  . Amoxicillin-Pot Clavulanate Diarrhea    Has patient had a PCN reaction causing immediate rash, facial/tongue/throat swelling, SOB or lightheadedness with hypotension:No Has patient had a PCN reaction causing severe rash involving mucus membranes or skin necrosis:No Has patient had a PCN reaction that required hospitalization:No Has patient had a PCN reaction occurring within THE LAST 10  YEARS.  #  #  #  YES  #  #  #  If all of the above answers are "NO", then may proceed with Cephalosporin use.   . Aspirin Nausea And Vomiting and Rash    On 325mg  dosage  . Cholestyramine Nausea And Vomiting  . Dilaudid [Hydromorphone Hcl] Nausea And Vomiting  . Lasix [Furosemide] Nausea And Vomiting and Other (See Comments)    Headache     Medications Prior to Admission  Medication Sig Dispense Refill Last Dose  . ACCU-CHEK SOFTCLIX LANCETS lancets USE AS DIRECTED DAILY UP TO THREE TIMES DAILY 100 each 5   . acetaminophen-codeine (TYLENOL #3) 300-30 MG tablet Take 1 tablet by mouth every 8 (eight) hours as needed for moderate pain. 30 tablet 0 Taking  . albuterol (PROVENTIL HFA;VENTOLIN HFA) 108 (90 Base) MCG/ACT inhaler Inhale 2 puffs into the lungs every 4 (four) hours as needed for wheezing or shortness of breath. 1 Inhaler 3 Taking  . aspirin 81 MG EC tablet Take 1 tablet (81 mg total) by mouth daily. 30 tablet 2 Taking  . benzonatate (TESSALON) 100 MG capsule Take 1 capsule (100 mg total) by mouth every 8 (eight) hours. 21 capsule 0 Taking  . Blood Glucose Monitoring Suppl (ACCU-CHEK AVIVA) device Use as instructed daily. (Patient taking differently: 1 each by Other route 3 (three) times daily. ) 1 each 0 Taking  . cyclobenzaprine (FLEXERIL) 10 MG tablet Take 1 tablet (10 mg total) by mouth 2 (two) times daily as needed for muscle spasms. 20 tablet 0 Taking  . dicyclomine (BENTYL) 10 MG capsule TAKE ONE CAPSULE BY MOUTH THREE TIMES DAILY BEFORE MEALS 90 capsule 5 Taking  . esomeprazole (NEXIUM) 20 MG capsule Take 1 capsule (20 mg total) by mouth daily. 30 capsule 5 Taking  . FLUoxetine (PROZAC) 40 MG capsule Take 1 capsule (40 mg total) by mouth at bedtime. 30 capsule 5   . fluticasone (FLONASE) 50 MCG/ACT nasal spray Place 1 spray into both nostrils daily. 16 g 2 Taking  . glimepiride (AMARYL) 4 MG tablet Take 2 tablets (8 mg total) by mouth daily with breakfast. 60 tablet 5 Taking  .  glucose blood (ACCU-CHEK AVIVA PLUS) test strip 1 each by Other route 3 (three) times daily. MUST MAKE APPT FOR FURTHER REFILLS 100 each 0   . hydrOXYzine (ATARAX/VISTARIL) 10 MG tablet Take 1 tablet (10 mg total) by mouth daily. 30 tablet 5   . Insulin Glargine (LANTUS SOLOSTAR) 100 UNIT/ML Solostar Pen Inject  20 Units into the skin daily at 10 pm. 3 pen 5 Taking  . Insulin Pen Needle 31G X 5 MM MISC 1 each by Does not apply route at bedtime. 30 each 5 Taking  . Lancet Devices (ACCU-CHEK SOFTCLIX) lancets Use as instructed daily. (Patient taking differently: 1 each by Other route 3 (three) times daily. ) 1 each 5 Taking  . loperamide (IMODIUM) 2 MG capsule Take 4 mg by mouth 2 (two) times daily.   Taking  . meclizine (ANTIVERT) 25 MG tablet Take 1 tablet (25 mg total) by mouth 3 (three) times daily as needed for dizziness. 60 tablet 3 Taking  . metoprolol succinate (TOPROL-XL) 25 MG 24 hr tablet Take 1 tablet (25 mg total) by mouth daily. 30 tablet 5 Taking  . oxyCODONE-acetaminophen (PERCOCET/ROXICET) 5-325 MG tablet Take 1 tablet by mouth every 12 (twelve) hours as needed for severe pain. 60 tablet 0 Taking  . pravastatin (PRAVACHOL) 40 MG tablet Take 1 tablet (40 mg total) by mouth daily. 30 tablet 0 Taking  . predniSONE (DELTASONE) 20 MG tablet Take 2 tablets (40 mg total) by mouth daily. 10 tablet 0 Taking  . tiotropium (SPIRIVA) 18 MCG inhalation capsule Place 1 capsule (18 mcg total) into inhaler and inhale daily. 30 capsule 5 Taking   Review of Systems  Constitutional: Negative.   Respiratory: Negative.   Cardiovascular: Negative.   Gastrointestinal: Negative.   Genitourinary: Positive for dysuria and frequency. Negative for difficulty urinating, flank pain, pelvic pain, vaginal bleeding and vaginal discharge.       Vaginal itching  Musculoskeletal: Positive for back pain.   Physical Exam   Blood pressure (!) 148/88, pulse 96, temperature 98.5 F (36.9 C), temperature source Oral,  height 5\' 7"  (1.702 m), weight 71.8 kg, last menstrual period 07/24/2011.  Physical Exam  Nursing note and vitals reviewed. Constitutional: She is oriented to person, place, and time. She appears well-developed and well-nourished. No distress.  Cardiovascular: Normal rate, regular rhythm and normal heart sounds.  Respiratory: Effort normal and breath sounds normal. No respiratory distress. She has no wheezes.  GI: Soft. She exhibits no distension. There is no tenderness.  Neurological: She is alert and oriented to person, place, and time.  Skin: Skin is warm and dry. No erythema. No pallor.  Psychiatric: She has a normal mood and affect. Her behavior is normal. Thought content normal.   MAU Course  Procedures  MDM Orders Placed This Encounter  Procedures  . Urinalysis, Routine w reflex microscopic  . Comprehensive metabolic panel  . CBC  . Pregnancy, urine POC   Results for orders placed or performed during the hospital encounter of 04/17/18 (from the past 24 hour(s))  Urinalysis, Routine w reflex microscopic     Status: Abnormal   Collection Time: 04/17/18  7:55 PM  Result Value Ref Range   Color, Urine STRAW (A) YELLOW   APPearance CLEAR CLEAR   Specific Gravity, Urine 1.036 (H) 1.005 - 1.030   pH 6.0 5.0 - 8.0   Glucose, UA >=500 (A) NEGATIVE mg/dL   Hgb urine dipstick NEGATIVE NEGATIVE   Bilirubin Urine NEGATIVE NEGATIVE   Ketones, ur NEGATIVE NEGATIVE mg/dL   Protein, ur NEGATIVE NEGATIVE mg/dL   Nitrite NEGATIVE NEGATIVE   Leukocytes, UA NEGATIVE NEGATIVE   RBC / HPF 0-5 0 - 5 RBC/hpf   WBC, UA 0-5 0 - 5 WBC/hpf   Bacteria, UA NONE SEEN NONE SEEN   Squamous Epithelial / LPF 0-5 0 - 5  Mucus PRESENT   Pregnancy, urine POC     Status: None   Collection Time: 04/17/18  7:57 PM  Result Value Ref Range   Preg Test, Ur NEGATIVE NEGATIVE  Comprehensive metabolic panel     Status: Abnormal   Collection Time: 04/17/18  8:43 PM  Result Value Ref Range   Sodium 130 (L)  135 - 145 mmol/L   Potassium 3.8 3.5 - 5.1 mmol/L   Chloride 95 (L) 98 - 111 mmol/L   CO2 23 22 - 32 mmol/L   Glucose, Bld 519 (HH) 70 - 99 mg/dL   BUN 8 6 - 20 mg/dL   Creatinine, Ser 1.61 0.44 - 1.00 mg/dL   Calcium 9.0 8.9 - 09.6 mg/dL   Total Protein 7.3 6.5 - 8.1 g/dL   Albumin 4.0 3.5 - 5.0 g/dL   AST 32 15 - 41 U/L   ALT 37 0 - 44 U/L   Alkaline Phosphatase 131 (H) 38 - 126 U/L   Total Bilirubin 0.5 0.3 - 1.2 mg/dL   GFR calc non Af Amer >60 >60 mL/min   GFR calc Af Amer >60 >60 mL/min   Anion gap 12 5 - 15  CBC     Status: Abnormal   Collection Time: 04/17/18  8:43 PM  Result Value Ref Range   WBC 9.8 4.0 - 10.5 K/uL   RBC 5.47 (H) 3.87 - 5.11 MIL/uL   Hemoglobin 15.8 (H) 12.0 - 15.0 g/dL   HCT 04.5 (H) 40.9 - 81.1 %   MCV 84.8 80.0 - 100.0 fL   MCH 28.9 26.0 - 34.0 pg   MCHC 34.1 30.0 - 36.0 g/dL   RDW 91.4 78.2 - 95.6 %   Platelets 152 150 - 400 K/uL   nRBC 0.0 0.0 - 0.2 %   Consult with Dr Earlene Plater with assessment and labs, recommends transfer to [facility] or [facility] for Glucose management  Labs consistent with Hyperglycemia, patient not in DKA at this time  Consult with Dr Rhunette Croft with recommended transfer to facility, Dr accepted patient and notified that she will be coming by private vehicle.   Pt stable at time of transfer. Pt to [facility] via [employer].   Assessment and Plan   1. Elevated serum glucose   2. Dysuria   3. Frequency of urination    Transfer to [facility] by private vehicle for glucose management  Dr Rhunette Croft to assume care of patient on arrival to [facility]  Sharyon Cable CNM 04/18/2018, 00:17 AM

## 2018-04-17 NOTE — MAU Note (Signed)
LMP WAS 2013.- NO BIRTH CONTROL.  NO SEX- X2 YEARS

## 2018-04-17 NOTE — MAU Note (Addendum)
PT SAYS X2 WEEKS -  CALLED [facility]-  NO APPOINTMENT     SHE HURTS IN LOWER BACK - STARTED 2-3 WEEKS AGO. AND PERINEUM  ITCHING- AND SWOLLEN   STARTED  3-4 DAYS  AGO.  HAS BEEN VOIDING   MORE.   IS A DIABETIC- SAW 6 MTHS AGO. - TAKES  INSULIN

## 2018-04-18 ENCOUNTER — Emergency Department (HOSPITAL_COMMUNITY)
Admission: EM | Admit: 2018-04-18 | Discharge: 2018-04-18 | Disposition: A | Discharge status: Home / Self Care | Payer: Medicaid Other | Attending: Emergency Medicine | Admitting: Emergency Medicine

## 2018-04-18 ENCOUNTER — Encounter (HOSPITAL_COMMUNITY): Payer: Self-pay | Admitting: Emergency Medicine

## 2018-04-18 ENCOUNTER — Other Ambulatory Visit: Payer: Self-pay

## 2018-04-18 ENCOUNTER — Telehealth: Payer: Self-pay

## 2018-04-18 DIAGNOSIS — E1165 Type 2 diabetes mellitus with hyperglycemia: Secondary | ICD-10-CM

## 2018-04-18 DIAGNOSIS — I110 Hypertensive heart disease with heart failure: Secondary | ICD-10-CM | POA: Insufficient documentation

## 2018-04-18 DIAGNOSIS — I10 Essential (primary) hypertension: Secondary | ICD-10-CM

## 2018-04-18 DIAGNOSIS — J449 Chronic obstructive pulmonary disease, unspecified: Secondary | ICD-10-CM | POA: Insufficient documentation

## 2018-04-18 DIAGNOSIS — I5022 Chronic systolic (congestive) heart failure: Secondary | ICD-10-CM | POA: Insufficient documentation

## 2018-04-18 DIAGNOSIS — R739 Hyperglycemia, unspecified: Secondary | ICD-10-CM

## 2018-04-18 DIAGNOSIS — Z91040 Latex allergy status: Secondary | ICD-10-CM | POA: Insufficient documentation

## 2018-04-18 DIAGNOSIS — F17210 Nicotine dependence, cigarettes, uncomplicated: Secondary | ICD-10-CM | POA: Insufficient documentation

## 2018-04-18 DIAGNOSIS — Z79899 Other long term (current) drug therapy: Secondary | ICD-10-CM | POA: Insufficient documentation

## 2018-04-18 DIAGNOSIS — Z794 Long term (current) use of insulin: Principal | ICD-10-CM

## 2018-04-18 LAB — CBG MONITORING, ED
GLUCOSE-CAPILLARY: 272 mg/dL — AB (ref 70–99)
Glucose-Capillary: 364 mg/dL — ABNORMAL HIGH (ref 70–99)

## 2018-04-18 MED ORDER — IBUPROFEN 800 MG PO TABS
800.0000 mg | ORAL_TABLET | Freq: Once | ORAL | Status: AC
  Administered 2018-04-18: 800 mg via ORAL
  Filled 2018-04-18: qty 1

## 2018-04-18 MED ORDER — INSULIN ASPART 100 UNIT/ML ~~LOC~~ SOLN
12.0000 [IU] | Freq: Once | SUBCUTANEOUS | Status: AC
  Administered 2018-04-18: 12 [IU] via SUBCUTANEOUS
  Filled 2018-04-18: qty 1

## 2018-04-18 MED ORDER — INSULIN GLARGINE 100 UNIT/ML SOLOSTAR PEN: 20.0000 [IU] | PEN_INJECTOR | Freq: Every day | SUBCUTANEOUS | 1 refills | Status: DC

## 2018-04-18 MED ORDER — ACCU-CHEK SOFTCLIX LANCETS MISC: 0 refills | Status: DC

## 2018-04-18 MED ORDER — METOPROLOL SUCCINATE ER 25 MG PO TB24: 25.0000 mg | ORAL_TABLET | Freq: Every day | ORAL | 0 refills | Status: DC

## 2018-04-18 MED ORDER — GLIMEPIRIDE 4 MG PO TABS: 8.0000 mg | ORAL_TABLET | Freq: Every day | ORAL | 0 refills | Status: DC

## 2018-04-18 MED ORDER — FENOFIBRATE 48 MG PO TABS: 48.0000 mg | ORAL_TABLET | Freq: Every day | ORAL | 0 refills | Status: DC

## 2018-04-18 MED ORDER — FLUCONAZOLE 150 MG PO TABS
150.0000 mg | ORAL_TABLET | Freq: Once | ORAL | Status: AC
  Administered 2018-04-18: 150 mg via ORAL
  Filled 2018-04-18: qty 1

## 2018-04-18 NOTE — Telephone Encounter (Signed)
Message received from Eldridge Abrahams, RN CM requesting a hospital follow up appointment for the patient.    Call placed to the patient and an appointment has been scheduled for her to see Dr Alvis Lemmings 04/20/18 @ 1010.   The patient stated that she has not been able to get an appointment at [facility].  She has been told to call back to schedule an appointment every time she had called and she ran out of medication.    She said that she has been out of insulin x 2 months and out of glimepiride and tricor x 1 month. . She would like re-assurance from Dr Alvis Lemmings that it is okay to start taking the medications as noted on the AVS.  She said that she will be fasting for her appointment on 04/20/18. She is also is requesting a refill for her  Test strips to be sent to [facility].   Informed her that Dr Alvis Lemmings would be notified of her concerns and she would receive a call back.   This information was shared with Dr Alvis Lemmings who wants her to take her medications as prescribed.   Call returned to the patient and informed her that Dr Alvis Lemmings has been notified of her concerns and she should take her medication as prescribed  She then requested that the refill for the test strips be ordered for testing 4 times daily until her blood sugar is better controlled.

## 2018-04-18 NOTE — MAU Note (Signed)
PT TRANSFERRED VIA PRIVATE VEHICLE- WITH DAUGHTER TO [facility]. Marland Kitchen

## 2018-04-18 NOTE — ED Provider Notes (Signed)
[facility]-EMERGENCY DEPT Provider Note   CSN: [id number] Arrival date & time: 04/18/18  0023    History   Chief Complaint Chief Complaint  Patient presents with  . Hyperglycemia    HPI [name] is a 57 y.o. female.   57 year old female with history of hypertension, esophageal reflux, COPD, diabetes presents to the emergency department today in transfer from [facility].  She went to [facility] earlier for complaints of urinary frequency and dysuria.  Was found to be hyperglycemic with otherwise reassuring evaluation.  No evidence of urinary tract infection.  The patient states that she has been off of her medications for months.  She has been unable to get into be seen by her primary care doctor.  She reports they will not write her refills of her medicines without an office visit.  With respect to her dysuria, she has noticed a slight rash in her labial region.  No fevers, vomiting, vaginal complaints or abnormal discharge.     Past Medical History:  Diagnosis Date  . Anxiety   . Arthritis   . Chronic systolic heart failure (HCC)    EF 20-25% ECHO 05/2015  . COPD (chronic obstructive pulmonary disease) (HCC)   . Depression   . Diabetes mellitus without complication (HCC)    Type II  . Dysrhythmia    pt unsure of name of arrythmia - " my heart rate will drop all of a sudden" -no current treatment - atrial flutter  . Fibrillation, atrial (HCC)   . Gallstones   . GERD (gastroesophageal reflux disease)   . Headache(784.0)    migraines  . Hypertension   . Osteomyelitis (HCC)    R ankle  . Rash    arms    Patient Active Problem List   Diagnosis Date Noted  . 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 Primary osteoarthritis of both hands 10/06/2016  . History of gastroesophageal reflux (GERD) 10/06/2016  . History of COPD 10/06/2016  . History of anxiety 10/06/2016  . History of CHF (congestive heart failure) 10/06/2016  . History of  hypertension 10/06/2016  . History of cardiac dysrhythmia 10/06/2016  . Primary osteoarthritis of both feet 10/06/2016  . Primary osteoarthritis of both knees 10/06/2016  . History of infection/ ankle post operative  10/06/2016  . Rheumatoid factor positive 10/06/2016  . De Quervain's disease (radial styloid tenosynovitis) 06/09/2016  . Tobacco abuse 05/23/2016  . Hyperglycemia without ketosis   . Bleeding hemorrhoids 11/03/2015  . Acute blood loss anemia 11/03/2015  . Hematochezia 11/01/2015  . Back pain with sciatica 09/08/2015  . Infection of bone of ankle (HCC) 07/15/2015  . Chronic systolic CHF (congestive heart failure) (HCC) 06/24/2015  . Chest pain 06/03/2015  . Hypertension   . Anxiety   . Gallstones   . GERD (gastroesophageal reflux disease)   . COPD (chronic obstructive pulmonary disease) (HCC)   . Dysrhythmia   . Rash   . Depression   . Diabetes mellitus without complication (HCC)   . Post-operative state 12/10/2011    Past Surgical History:  Procedure Laterality Date  . ANKLE FRACTURE SURGERY Right 2015  . CARDIAC CATHETERIZATION N/A 06/24/2015   Procedure: Left Heart Cath and Coronary Angiography;  Surgeon: [name], MD;  Location: [facility] INVASIVE CV LAB;  Service: Cardiovascular;  Laterality: N/A;  . CARPAL TUNNEL RELEASE     bil  . CHOLECYSTECTOMY  11/24/2011   Procedure: LAPAROSCOPIC CHOLECYSTECTOMY WITH INTRAOPERATIVE CHOLANGIOGRAM;  Surgeon: Clovis Pu. Cornett,  MD;  Location: [facility] ORS;  Service: General;  Laterality: N/A;  Laparoscopic Cholecystectomy with Cholangiogram  . COLONOSCOPY    . ECTOPIC PREGNANCY SURGERY  many yrs ago  . HARDWARE REMOVAL Right 07/15/2015   Procedure: RIGHT ANKLE HARDWARE REMOVAL, PLACEMENT OF STIMULAN ANTIBIOTIC BEADS AND PREVENA WOUND VAC.;  Surgeon: [name], MD;  Location: [facility] OR;  Service: Orthopedics;  Laterality: Right;  . TOOTH EXTRACTION N/A 12/03/2016   Procedure: DENTAL EXTRACTIONS with Alveoloplasty;  Surgeon:  Ocie Doyne, DDS;  Location: [facility] OR;  Service: Oral Surgery;  Laterality: N/A;     OB History    Gravida  9   Para      Term      Preterm      AB  5   Living  4     SAB  4   TAB      Ectopic  1   Multiple      Live Births  4            Home Medications    Prior to Admission medications   Medication Sig Start Date End Date Taking? Authorizing Provider  albuterol (PROVENTIL HFA;VENTOLIN HFA) 108 (90 Base) MCG/ACT inhaler Inhale 2 puffs into the lungs every 4 (four) hours as needed for wheezing or shortness of breath. 09/12/17  Yes Eber Hong, MD  Blood Glucose Monitoring Suppl (ACCU-CHEK AVIVA) device Use as instructed daily. Patient taking differently: 1 each by Other route 3 (three) times daily.  11/24/16  Yes Newlin, Odette Horns, MD  dicyclomine (BENTYL) 10 MG capsule TAKE ONE CAPSULE BY MOUTH THREE TIMES DAILY BEFORE MEALS Patient taking differently: Take 10 mg by mouth 4 (four) times daily -  before meals and at bedtime.  05/26/17  Yes Hoy Register, MD  esomeprazole (NEXIUM) 20 MG capsule Take 1 capsule (20 mg total) by mouth daily. 11/07/17  Yes Hoy Register, MD  FLUoxetine (PROZAC) 40 MG capsule Take 1 capsule (40 mg total) by mouth at bedtime. 11/18/17  Yes Hoy Register, MD  hydrOXYzine (ATARAX/VISTARIL) 10 MG tablet Take 1 tablet (10 mg total) by mouth daily. 11/18/17  Yes Newlin, Odette Horns, MD  Insulin Pen Needle 31G X 5 MM MISC 1 each by Does not apply route at bedtime. 03/29/17  Yes Hoy Register, MD  [facility]) lancets Use as instructed daily. Patient taking differently: 1 each by Other route 3 (three) times daily.  11/24/16  Yes Hoy Register, MD  loperamide (IMODIUM) 2 MG capsule Take 4 mg by mouth every 4 (four) hours as needed for diarrhea or loose stools.    Yes [provider]  meclizine (ANTIVERT) 25 MG tablet Take 1 tablet (25 mg total) by mouth 3 (three) times daily as needed for dizziness. 03/29/17  Yes Hoy Register, MD  pravastatin (PRAVACHOL) 40 MG tablet Take 1 tablet (40 mg total) by mouth daily. 10/18/17  Yes Hoy Register, MD  tiotropium (SPIRIVA) 18 MCG inhalation capsule Place 1 capsule (18 mcg total) into inhaler and inhale daily. 03/29/17  Yes Hoy Register, MD  ACCU-CHEK SOFTCLIX LANCETS lancets USE AS DIRECTED DAILY UP TO THREE TIMES DAILY 04/18/18   Antony Madura, [name]  aspirin 81 MG EC tablet Take 1 tablet (81 mg total) by mouth daily. Patient not taking: Reported on 04/18/2018 05/25/16   Nyra Market, MD  benzonatate (TESSALON) 100 MG capsule Take 1 capsule (100 mg total) by mouth every 8 (eight) hours. Patient not taking: Reported on 04/18/2018 09/12/17  Eber Hong, MD  cyclobenzaprine (FLEXERIL) 10 MG tablet Take 1 tablet (10 mg total) by mouth 2 (two) times daily as needed for muscle spasms. Patient not taking: Reported on 04/18/2018 10/08/17   Virgina Norfolk, DO  fenofibrate (TRICOR) 48 MG tablet Take 1 tablet (48 mg total) by mouth daily. 04/18/18   Antony Madura, [name]  fluticasone (FLONASE) 50 MCG/ACT nasal spray Place 1 spray into both nostrils daily. Patient not taking: Reported on 04/18/2018 10/08/17   Virgina Norfolk, DO  glimepiride (AMARYL) 4 MG tablet Take 2 tablets (8 mg total) by mouth daily with breakfast. 04/18/18   Antony Madura, [name]  glucose blood (ACCU-CHEK AVIVA PLUS) test strip 1 each by Other route 3 (three) times daily. MUST MAKE APPT FOR FURTHER REFILLS 01/13/18   Hoy Register, MD  Insulin Glargine (LANTUS SOLOSTAR) 100 UNIT/ML Solostar Pen Inject 20 Units into the skin daily at 10 pm. 04/18/18   Antony Madura, [name]  metoprolol succinate (TOPROL-XL) 25 MG 24 hr tablet Take 1 tablet (25 mg total) by mouth daily. 04/18/18   Antony Madura, [name]  predniSONE (DELTASONE) 20 MG tablet Take 2 tablets (40 mg total) by mouth daily. Patient not taking: Reported on 04/18/2018 09/12/17   Eber Hong, MD    Family History Family History  Problem Relation Age of Onset    . Lung cancer Father   . Heart attack Father 45       CABG x4  . Breast cancer Sister        Survivor     Social History Social History   Tobacco Use  . Smoking status: Light Tobacco Smoker    Packs/day: 0.00    Years: 20.00    Pack years: 0.00    Types: Cigarettes, E-cigarettes    Start date: 09/19/2016  . Smokeless tobacco: Never Used  Substance Use Topics  . Alcohol use: No    Alcohol/week: 0.0 standard drinks  . Drug use: No     Allergies   Biaxin [clarithromycin]; Clarithromycin; Lisinopril; Sulfa antibiotics; Chlorthalidone; Daptomycin; Latex; Amoxicillin-pot clavulanate; Aspirin; Cholestyramine; Dilaudid [hydromorphone hcl]; and Lasix [furosemide]   Review of Systems Review of Systems Ten systems reviewed and are negative for acute change, except as noted in the HPI.    Physical Exam Updated Vital Signs BP 139/86 (BP Location: Right Arm)   Pulse 85   Temp 98.1 F (36.7 C) (Oral)   Resp (!) 24   Ht 5\' 7"  (1.702 m)   Wt 71.7 kg   LMP 07/24/2011   SpO2 98%   BMI 24.75 kg/m   Physical Exam  Constitutional: She is oriented to person, place, and time. She appears well-developed and well-nourished. No distress.  Nontoxic appearing and in NAD  HENT:  Head: Normocephalic and atraumatic.  Eyes: Conjunctivae and EOM are normal. No scleral icterus.  Neck: Normal range of motion.  Cardiovascular: Normal rate, regular rhythm and intact distal pulses.  Pulmonary/Chest: Effort normal. No respiratory distress.  Respirations even and unlabored  Musculoskeletal: Normal range of motion.  Neurological: She is alert and oriented to person, place, and time. She exhibits normal muscle tone. Coordination normal.  GCS 15. Moving all extremities spontaneously.  Skin: Skin is warm and dry. No rash noted. She is not diaphoretic. No erythema. No pallor.  Psychiatric: She has a normal mood and affect. Her behavior is normal.  Nursing note and vitals reviewed.    ED  Treatments / Results  Labs (all labs ordered are listed, but only abnormal results  are displayed) Labs Reviewed  CBG MONITORING, ED - Abnormal; Notable for the following components:      Result Value   Glucose-Capillary 364 (*)    All other components within normal limits  CBG MONITORING, ED - Abnormal; Notable for the following components:   Glucose-Capillary 272 (*)    All other components within normal limits    EKG None  Radiology No results found.  Procedures Procedures (including critical care time)  Medications Ordered in ED Medications  insulin aspart (novoLOG) injection 12 Units (12 Units Subcutaneous Given 04/18/18 0127)  ibuprofen (ADVIL,MOTRIN) tablet 800 mg (800 mg Oral Given 04/18/18 0258)  fluconazole (DIFLUCAN) tablet 150 mg (150 mg Oral Given 04/18/18 0258)     Initial Impression / Assessment and Plan / ED Course  I have reviewed the triage vital signs and the nursing notes.  Pertinent labs & imaging results that were available during my care of the patient were reviewed by me and considered in my medical decision making (see chart for details).     57 year old female presents to the emergency department for evaluation of hyperglycemia.  She was evaluated previously at [facility] with CBG of 519.  Her blood sugar here has improved following subcutaneous insulin.  She had no evidence of DKA on prior work-up.  She initially presented to [facility] for urinary frequency and dysuria.  There was no evidence of urinary tract infection.  She does report a rash in her labial region which is reddish in color.  Given her uncontrolled diabetes, there is potential for yeast infection/candidal labialis contributing to symptoms as well.  She was given a dose of Diflucan in the emergency department for this reason.  Reason for hyperglycemia is routed in medication noncompliance.  She has been unable to get refills of her medications because she has not been able  to see her primary doctor.  She states that her doctor was not willing to prescribe her medications without an office visit.  Have placed consult to care management regarding barriers to outpatient follow-up.  The patient was given a one-month supply of her medications in the interim.  I do not believe further emergent work-up is indicated at this time.  The patient has been instructed to check her sugars regularly.  Return precautions discussed and provided. Patient discharged in stable condition with no unaddressed concerns.   Final Clinical Impressions(s) / ED Diagnoses   Final diagnoses:  Hyperglycemia    ED Discharge Orders         Ordered    metoprolol succinate (TOPROL-XL) 25 MG 24 hr tablet  Daily     04/18/18 0236    fenofibrate (TRICOR) 48 MG tablet  Daily     04/18/18 0236    Insulin Glargine (LANTUS SOLOSTAR) 100 UNIT/ML Solostar Pen  Daily at 10 pm    Note to Pharmacy:  Discontinue previous dose   04/18/18 0236    glimepiride (AMARYL) 4 MG tablet  Daily with breakfast    Note to Pharmacy:  Must have office visit for refills   04/18/18 0236    ACCU-CHEK SOFTCLIX LANCETS lancets    Note to Pharmacy:  This prescription was filled on 12/15/2017. Any refills authorized will be placed on file.   04/18/18 0237           Antony Madura, [name] 04/18/18 [id number]    Derwood Kaplan, MD 04/18/18 [phone]

## 2018-04-18 NOTE — Care Management Note (Signed)
Case Management Note  CM consulted for difficulty getting an appointment with her PCP.  CM sent a message to [facility] CM for assistance.  Jannetta Quint, PA via messages.  No further CM needs noted at this time.  Wynne Rozak, Lynnae Sandhoff, RN 04/18/2018, 9:18 AM

## 2018-04-18 NOTE — Discharge Instructions (Signed)
Continue your diabetes medications as prescribed.  Be sure to check your sugars regularly.  To prevent low sugar levels as well as elevated sugars.  Schedule an appointment with your primary care doctor as soon as you are able for reevaluation in the office.  You may return for new or concerning symptoms.

## 2018-04-18 NOTE — ED Triage Notes (Signed)
Pt presents from [facility] after reporting increasing urination and then when labs were done pt was hyperglycemic. Urine and blood work was done at [facility]

## 2018-04-19 MED ORDER — GLUCOSE BLOOD VI STRP: 1.0000 | ORAL_STRIP | Freq: Four times a day (QID) | 12 refills | Status: DC

## 2018-04-19 NOTE — Telephone Encounter (Signed)
I have sent her refill as requested. Advised to comply with medications and I will see her at her office visit.

## 2018-04-20 ENCOUNTER — Ambulatory Visit: Payer: Medicaid Other | Attending: Family Medicine | Admitting: Family Medicine

## 2018-04-20 ENCOUNTER — Encounter: Payer: Self-pay | Admitting: Family Medicine

## 2018-04-20 VITALS — BP 142/89 | HR 85 | Temp 98.5°F | Ht 67.0 in | Wt 157.0 lb

## 2018-04-20 DIAGNOSIS — E119 Type 2 diabetes mellitus without complications: Secondary | ICD-10-CM | POA: Diagnosis not present

## 2018-04-20 DIAGNOSIS — Z880 Allergy status to penicillin: Secondary | ICD-10-CM | POA: Diagnosis not present

## 2018-04-20 DIAGNOSIS — K219 Gastro-esophageal reflux disease without esophagitis: Secondary | ICD-10-CM | POA: Diagnosis not present

## 2018-04-20 DIAGNOSIS — Z79899 Other long term (current) drug therapy: Secondary | ICD-10-CM | POA: Diagnosis not present

## 2018-04-20 DIAGNOSIS — M25552 Pain in left hip: Secondary | ICD-10-CM | POA: Diagnosis not present

## 2018-04-20 DIAGNOSIS — M1990 Unspecified osteoarthritis, unspecified site: Secondary | ICD-10-CM | POA: Insufficient documentation

## 2018-04-20 DIAGNOSIS — Z1239 Encounter for other screening for malignant neoplasm of breast: Secondary | ICD-10-CM

## 2018-04-20 DIAGNOSIS — Z888 Allergy status to other drugs, medicaments and biological substances status: Secondary | ICD-10-CM | POA: Insufficient documentation

## 2018-04-20 DIAGNOSIS — Z885 Allergy status to narcotic agent status: Secondary | ICD-10-CM | POA: Insufficient documentation

## 2018-04-20 DIAGNOSIS — Z886 Allergy status to analgesic agent status: Secondary | ICD-10-CM | POA: Insufficient documentation

## 2018-04-20 DIAGNOSIS — J410 Simple chronic bronchitis: Secondary | ICD-10-CM

## 2018-04-20 DIAGNOSIS — H8113 Benign paroxysmal vertigo, bilateral: Secondary | ICD-10-CM | POA: Diagnosis not present

## 2018-04-20 DIAGNOSIS — Z7982 Long term (current) use of aspirin: Secondary | ICD-10-CM | POA: Insufficient documentation

## 2018-04-20 DIAGNOSIS — I10 Essential (primary) hypertension: Secondary | ICD-10-CM

## 2018-04-20 DIAGNOSIS — L299 Pruritus, unspecified: Secondary | ICD-10-CM | POA: Insufficient documentation

## 2018-04-20 DIAGNOSIS — I5022 Chronic systolic (congestive) heart failure: Secondary | ICD-10-CM | POA: Diagnosis not present

## 2018-04-20 DIAGNOSIS — K580 Irritable bowel syndrome with diarrhea: Secondary | ICD-10-CM | POA: Diagnosis not present

## 2018-04-20 DIAGNOSIS — Z882 Allergy status to sulfonamides status: Secondary | ICD-10-CM | POA: Diagnosis not present

## 2018-04-20 DIAGNOSIS — Z794 Long term (current) use of insulin: Secondary | ICD-10-CM | POA: Diagnosis not present

## 2018-04-20 DIAGNOSIS — E1165 Type 2 diabetes mellitus with hyperglycemia: Secondary | ICD-10-CM | POA: Insufficient documentation

## 2018-04-20 DIAGNOSIS — I110 Hypertensive heart disease with heart failure: Secondary | ICD-10-CM | POA: Diagnosis not present

## 2018-04-20 DIAGNOSIS — F419 Anxiety disorder, unspecified: Secondary | ICD-10-CM | POA: Diagnosis not present

## 2018-04-20 DIAGNOSIS — F329 Major depressive disorder, single episode, unspecified: Secondary | ICD-10-CM | POA: Diagnosis not present

## 2018-04-20 DIAGNOSIS — Z8719 Personal history of other diseases of the digestive system: Secondary | ICD-10-CM

## 2018-04-20 LAB — GLUCOSE, POCT (MANUAL RESULT ENTRY)
POC GLUCOSE: 284 mg/dL — AB (ref 70–99)
POC Glucose: 284 mg/dl — AB (ref 70–99)

## 2018-04-20 LAB — POCT GLYCOSYLATED HEMOGLOBIN (HGB A1C): Hemoglobin A1C: 12.6 % — AB (ref 4.0–5.6)

## 2018-04-20 MED ORDER — FENOFIBRATE 48 MG PO TABS: 48.0000 mg | ORAL_TABLET | Freq: Every day | ORAL | 5 refills | Status: DC

## 2018-04-20 MED ORDER — HYDROXYZINE HCL 10 MG PO TABS: 10.0000 mg | ORAL_TABLET | Freq: Two times a day (BID) | ORAL | 5 refills | Status: DC | PRN

## 2018-04-20 MED ORDER — METOPROLOL SUCCINATE ER 25 MG PO TB24: 25.0000 mg | ORAL_TABLET | Freq: Every day | ORAL | 6 refills | Status: DC

## 2018-04-20 MED ORDER — PRAVASTATIN SODIUM 40 MG PO TABS: 40.0000 mg | ORAL_TABLET | Freq: Every day | ORAL | 6 refills | Status: DC

## 2018-04-20 MED ORDER — FLUOXETINE HCL 40 MG PO CAPS: 40.0000 mg | ORAL_CAPSULE | Freq: Every day | ORAL | 5 refills | Status: DC

## 2018-04-20 MED ORDER — INSULIN ASPART 100 UNIT/ML ~~LOC~~ SOLN
8.0000 [IU] | Freq: Once | SUBCUTANEOUS | Status: AC
  Administered 2018-04-20: 8 [IU] via SUBCUTANEOUS

## 2018-04-20 MED ORDER — ESOMEPRAZOLE MAGNESIUM 20 MG PO CPDR: 20.0000 mg | DELAYED_RELEASE_CAPSULE | Freq: Every day | ORAL | 5 refills | Status: DC

## 2018-04-20 MED ORDER — DICYCLOMINE HCL 10 MG PO CAPS: ORAL_CAPSULE | ORAL | 5 refills | Status: DC

## 2018-04-20 MED ORDER — MECLIZINE HCL 25 MG PO TABS: 25.0000 mg | ORAL_TABLET | Freq: Three times a day (TID) | ORAL | 3 refills | Status: DC | PRN

## 2018-04-20 MED ORDER — DIPHENOXYLATE-ATROPINE 2.5-0.025 MG PO TABS: 1.0000 | ORAL_TABLET | Freq: Four times a day (QID) | ORAL | 0 refills | Status: DC | PRN

## 2018-04-20 MED ORDER — GLIMEPIRIDE 4 MG PO TABS: 8.0000 mg | ORAL_TABLET | Freq: Every day | ORAL | 6 refills | Status: DC

## 2018-04-20 MED ORDER — TIOTROPIUM BROMIDE MONOHYDRATE 18 MCG IN CAPS: 18.0000 ug | ORAL_CAPSULE | Freq: Every day | RESPIRATORY_TRACT | 5 refills | Status: DC

## 2018-04-20 NOTE — Progress Notes (Signed)
Patient is having pain in right side. 

## 2018-04-20 NOTE — Progress Notes (Signed)
Subjective:  Patient ID: [name], female    DOB: 04-Oct-1960  Age: 57 y.o. MRN: [id number]  CC: Diabetes and Hospitalization Follow-up   HPI [name] is a 57 year old female with a history of type 2 diabetes mellitus (A1c 12.6), hypertension, CHF (EF 55-60%  2-D echo of 09/2016 which has improved from 25%-30% previously), anxiety, GERD who presents today for follow-up visit. She has an ED visit for hyperglycemia two days ago after she had been out of her medications due to not being seen in the clinic in the last one year. CBG was 519 but she had no evidence of DKA. Insulin was administered and she was subsequently discharged.  Today her CBG is 364 and she is yet to take her Glimepride as she states she had no test strips to check her sugar so she did not take it. She complains of chronic Diarrhea from her IBS uncontrolled on Bentyl and Imodium. Denies abdominal cramps. Referred to GI last year but referral did not go through. She has left hip pain which started a few days ago and denies trauma. Her anxiety causes her to pick at her skin and as a result she has lots of scars on her arms.  Past Medical History:  Diagnosis Date  . Anxiety   . Arthritis   . Chronic systolic heart failure (HCC)    EF 20-25% ECHO 05/2015  . COPD (chronic obstructive pulmonary disease) ([place])   . Depression   . Diabetes mellitus without complication ([place])    Type II  . Dysrhythmia    pt unsure of name of arrythmia - " my heart rate will drop all of a sudden" -no current treatment - atrial flutter  . Fibrillation, atrial ([place])   . Gallstones   . GERD (gastroesophageal reflux disease)   . Headache(784.0)    migraines  . Hypertension   . Osteomyelitis (HCC)    R ankle  . Rash    arms    Past Surgical History:  Procedure Laterality Date  . ANKLE FRACTURE SURGERY Right 2015  . CARDIAC CATHETERIZATION N/A 06/24/2015   Procedure: Left Heart Cath and Coronary Angiography;  Surgeon: Belva Crome, MD;  Location: [facility];  Service: Cardiovascular;  Laterality: N/A;  . CARPAL TUNNEL RELEASE     bil  . CHOLECYSTECTOMY  11/24/2011   Procedure: LAPAROSCOPIC CHOLECYSTECTOMY WITH INTRAOPERATIVE CHOLANGIOGRAM;  Surgeon: Joyice Faster. Cornett, MD;  Location: [facility];  Service: General;  Laterality: N/A;  Laparoscopic Cholecystectomy with Cholangiogram  . COLONOSCOPY    . ECTOPIC PREGNANCY SURGERY  many yrs ago  . HARDWARE REMOVAL Right 07/15/2015   Procedure: RIGHT ANKLE HARDWARE REMOVAL, PLACEMENT OF STIMULAN ANTIBIOTIC BEADS AND PREVENA WOUND VAC.;  Surgeon: Meredith Pel, MD;  Location: [facility];  Service: Orthopedics;  Laterality: Right;  . TOOTH EXTRACTION N/A 12/03/2016   Procedure: DENTAL EXTRACTIONS with Alveoloplasty;  Surgeon: Diona Browner, DDS;  Location: [facility];  Service: Oral Surgery;  Laterality: N/A;    Allergies  Allergen Reactions  . Biaxin [Clarithromycin] Anaphylaxis and Swelling  . Clarithromycin Shortness Of Breath and Swelling  . Lisinopril Swelling and Cough    Lip edema  . Sulfa Antibiotics Anaphylaxis, Shortness Of Breath, Swelling and Hypertension  . Chlorthalidone Nausea And Vomiting and Other (See Comments)    Clammy, Tachycardia, Headache   . Daptomycin Nausea And Vomiting  . Latex Hives and Rash  . Amoxicillin-Pot Clavulanate Diarrhea    Has patient had a  PCN reaction causing immediate rash, facial/tongue/throat swelling, SOB or lightheadedness with hypotension:No Has patient had a PCN reaction causing severe rash involving mucus membranes or skin necrosis:No Has patient had a PCN reaction that required hospitalization:No Has patient had a PCN reaction occurring within THE LAST 10 YEARS.  #  #  #  YES  #  #  #  If all of the above answers are "NO", then may proceed with Cephalosporin use.   . Aspirin Nausea And Vomiting and Rash    On '325mg'$  dosage  . Cholestyramine Nausea And Vomiting  . Dilaudid [Hydromorphone Hcl] Nausea And Vomiting  .  Lasix [Furosemide] Nausea And Vomiting and Other (See Comments)    Headache      Outpatient Medications Prior to Visit  Medication Sig Dispense Refill  . ACCU-CHEK SOFTCLIX LANCETS lancets USE AS DIRECTED DAILY UP TO THREE TIMES DAILY 100 each 0  . albuterol (PROVENTIL HFA;VENTOLIN HFA) 108 (90 Base) MCG/ACT inhaler Inhale 2 puffs into the lungs every 4 (four) hours as needed for wheezing or shortness of breath. 1 Inhaler 3  . Blood Glucose Monitoring Suppl (ACCU-CHEK AVIVA) device Use as instructed daily. (Patient taking differently: 1 each by Other route 3 (three) times daily. ) 1 each 0  . glucose blood (ACCU-CHEK AVIVA PLUS) test strip 1 each by Other route 4 (four) times daily. 100 each 12  . Insulin Glargine (LANTUS SOLOSTAR) 100 UNIT/ML Solostar Pen Inject 20 Units into the skin daily at 10 pm. 3 pen 1  . Insulin Pen Needle 31G X 5 MM MISC 1 each by Does not apply route at bedtime. 30 each 5  . Lancet Devices (ACCU-CHEK SOFTCLIX) lancets Use as instructed daily. (Patient taking differently: 1 each by Other route 3 (three) times daily. ) 1 each 5  . loperamide (IMODIUM) 2 MG capsule Take 4 mg by mouth every 4 (four) hours as needed for diarrhea or loose stools.     . dicyclomine (BENTYL) 10 MG capsule TAKE ONE CAPSULE BY MOUTH THREE TIMES DAILY BEFORE MEALS (Patient taking differently: Take 10 mg by mouth 4 (four) times daily -  before meals and at bedtime. ) 90 capsule 5  . esomeprazole (NEXIUM) 20 MG capsule Take 1 capsule (20 mg total) by mouth daily. 30 capsule 5  . fenofibrate (TRICOR) 48 MG tablet Take 1 tablet (48 mg total) by mouth daily. 30 tablet 0  . FLUoxetine (PROZAC) 40 MG capsule Take 1 capsule (40 mg total) by mouth at bedtime. 30 capsule 5  . glimepiride (AMARYL) 4 MG tablet Take 2 tablets (8 mg total) by mouth daily with breakfast. 60 tablet 0  . hydrOXYzine (ATARAX/VISTARIL) 10 MG tablet Take 1 tablet (10 mg total) by mouth daily. 30 tablet 5  . meclizine (ANTIVERT) 25  MG tablet Take 1 tablet (25 mg total) by mouth 3 (three) times daily as needed for dizziness. 60 tablet 3  . metoprolol succinate (TOPROL-XL) 25 MG 24 hr tablet Take 1 tablet (25 mg total) by mouth daily. 30 tablet 0  . pravastatin (PRAVACHOL) 40 MG tablet Take 1 tablet (40 mg total) by mouth daily. 30 tablet 0  . tiotropium (SPIRIVA) 18 MCG inhalation capsule Place 1 capsule (18 mcg total) into inhaler and inhale daily. 30 capsule 5  . aspirin 81 MG EC tablet Take 1 tablet (81 mg total) by mouth daily. (Patient not taking: Reported on 04/18/2018) 30 tablet 2  . benzonatate (TESSALON) 100 MG capsule Take 1 capsule (100 mg  total) by mouth every 8 (eight) hours. (Patient not taking: Reported on 04/18/2018) 21 capsule 0  . cyclobenzaprine (FLEXERIL) 10 MG tablet Take 1 tablet (10 mg total) by mouth 2 (two) times daily as needed for muscle spasms. (Patient not taking: Reported on 04/18/2018) 20 tablet 0  . predniSONE (DELTASONE) 20 MG tablet Take 2 tablets (40 mg total) by mouth daily. (Patient not taking: Reported on 04/18/2018) 10 tablet 0  . fluticasone (FLONASE) 50 MCG/ACT nasal spray Place 1 spray into both nostrils daily. (Patient not taking: Reported on 04/18/2018) 16 g 2   Facility-Administered Medications Prior to Visit  Medication Dose Route Frequency Provider Last Rate Last Dose  . regadenoson (LEXISCAN) injection SOLN 0.4 mg  0.4 mg Intravenous Once Dorothy Spark, MD      . technetium tetrofosmin (TC-MYOVIEW) injection 16.1 millicurie  09.6 millicurie Intravenous Once PRN Dorothy Spark, MD        ROS Review of Systems  Constitutional: Negative for activity change, appetite change and fatigue.  HENT: Negative for congestion, sinus pressure and sore throat.   Eyes: Negative for visual disturbance.  Respiratory: Negative for cough, chest tightness, shortness of breath and wheezing.   Cardiovascular: Negative for chest pain and palpitations.  Gastrointestinal: Positive for  diarrhea. Negative for abdominal distention, abdominal pain and constipation.  Endocrine: Negative for polydipsia.  Genitourinary: Negative for dysuria and frequency.  Musculoskeletal: Negative for arthralgias and back pain.        L hip pain  Skin: Positive for rash.  Neurological: Negative for tremors, light-headedness and numbness.  Hematological: Does not bruise/bleed easily.  Psychiatric/Behavioral: Negative for agitation and behavioral problems.    Objective:  BP (!) 142/89   Pulse 85   Temp 98.5 F (36.9 C) (Oral)   Ht '5\' 7"'$  (1.702 m)   Wt 157 lb (71.2 kg)   LMP 07/24/2011   SpO2 96%   BMI 24.59 kg/m   BP/Weight 04/20/2018 04/18/2018 04/54/0981  Systolic BP 191 478 295  Diastolic BP 89 86 88  Wt. (Lbs) 157 158 158.25  BMI 24.59 24.75 24.79      Physical Exam  Constitutional: She is oriented to person, place, and time. She appears well-developed and well-nourished.  HENT:  Right Ear: External ear normal.  Left Ear: External ear normal.  Mouth/Throat: Oropharynx is clear and moist.  Cardiovascular: Normal rate, normal heart sounds and intact distal pulses.  No murmur heard. Pulmonary/Chest: Effort normal and breath sounds normal. She has no wheezes. She has no rales. She exhibits no tenderness.  Abdominal: Soft. Bowel sounds are normal. She exhibits no distension and no mass. There is no tenderness.  Musculoskeletal: Normal range of motion.  Full ROM of both hips, no TTP elicited  Neurological: She is alert and oriented to person, place, and time.  Skin:  Scars from furuncles on extremities  Psychiatric: She has a normal mood and affect.    Lab Results  Component Value Date   HGBA1C 12.6 (A) 04/20/2018    Assessment & Plan:   1. Irritable bowel syndrome with diarrhea Uncontrolled Referred to GI - diphenoxylate-atropine (LOMOTIL) 2.5-0.025 MG tablet; Take 1 tablet by mouth 4 (four) times daily as needed for diarrhea or loose stools.  Dispense: 60  tablet; Refill: 0 - dicyclomine (BENTYL) 10 MG capsule; TAKE ONE CAPSULE BY MOUTH THREE TIMES DAILY BEFORE MEALS  Dispense: 90 capsule; Refill: 5  2. History of gastroesophageal reflux (GERD) Controlled - esomeprazole (NEXIUM) 20 MG capsule; Take 1 capsule (  20 mg total) by mouth daily.  Dispense: 30 capsule; Refill: 5  3. Anxiety Uncontrolled Increased dose of Hydroxyzine - FLUoxetine (PROZAC) 40 MG capsule; Take 1 capsule (40 mg total) by mouth at bedtime.  Dispense: 30 capsule; Refill: 5  4. Type 2 diabetes mellitus with hyperglycemia, with long-term current use of insulin (HCC) Uncontrolled due to running out of medications 8 units of Novolog administered due to elevated CBG of 364 Compliance with medications and diet emphasized Will see back at next visit to review blood sugar log and titrate dose accordingly Would love to place on Lipitor but she is unable to tolerate this due to complains of cough with Lipitor - POCT glucose (manual entry) - POCT glycosylated hemoglobin (Hb A1C) - glimepiride (AMARYL) 4 MG tablet; Take 2 tablets (8 mg total) by mouth daily with breakfast.  Dispense: 60 tablet; Refill: 6 - pravastatin (PRAVACHOL) 40 MG tablet; Take 1 tablet (40 mg total) by mouth daily.  Dispense: 30 tablet; Refill: 6 - Lipid panel - CMP14+EGFR - Microalbumin/Creatinine Ratio, Urine - insulin aspart (novoLOG) injection 8 Units - POCT glucose (manual entry)  5. Pruritus Secondary to anxiety Increased Hydroxyzine dose - hydrOXYzine (ATARAX/VISTARIL) 10 MG tablet; Take 1 tablet (10 mg total) by mouth 2 (two) times daily as needed.  Dispense: 60 tablet; Refill: 5  6. Benign paroxysmal positional vertigo due to bilateral vestibular disorder Stable - meclizine (ANTIVERT) 25 MG tablet; Take 1 tablet (25 mg total) by mouth 3 (three) times daily as needed for dizziness.  Dispense: 60 tablet; Refill: 3  7. Essential hypertension BP slightly above goal. No regimen change  today Counseled on blood pressure goal of less than 130/80, low-sodium, DASH diet, medication compliance, 150 minutes of moderate intensity exercise per week. Discussed medication compliance, adverse effects. - metoprolol succinate (TOPROL-XL) 25 MG 24 hr tablet; Take 1 tablet (25 mg total) by mouth daily.  Dispense: 30 tablet; Refill: 6  8. Simple chronic bronchitis (HCC) Controlled - tiotropium (SPIRIVA) 18 MCG inhalation capsule; Place 1 capsule (18 mcg total) into inhaler and inhale daily.  Dispense: 30 capsule; Refill: 5  9. Screening for breast cancer - MM Digital Screening; Future  Advised to use OTC analgesics for L hip pain and inform the clinic if symptoms do not improve.  Meds ordered this encounter  Medications  . diphenoxylate-atropine (LOMOTIL) 2.5-0.025 MG tablet    Sig: Take 1 tablet by mouth 4 (four) times daily as needed for diarrhea or loose stools.    Dispense:  60 tablet    Refill:  0  . dicyclomine (BENTYL) 10 MG capsule    Sig: TAKE ONE CAPSULE BY MOUTH THREE TIMES DAILY BEFORE MEALS    Dispense:  90 capsule    Refill:  5  . esomeprazole (NEXIUM) 20 MG capsule    Sig: Take 1 capsule (20 mg total) by mouth daily.    Dispense:  30 capsule    Refill:  5  . fenofibrate (TRICOR) 48 MG tablet    Sig: Take 1 tablet (48 mg total) by mouth daily.    Dispense:  30 tablet    Refill:  5  . FLUoxetine (PROZAC) 40 MG capsule    Sig: Take 1 capsule (40 mg total) by mouth at bedtime.    Dispense:  30 capsule    Refill:  5  . glimepiride (AMARYL) 4 MG tablet    Sig: Take 2 tablets (8 mg total) by mouth daily with breakfast.    Dispense:  60 tablet    Refill:  6  . hydrOXYzine (ATARAX/VISTARIL) 10 MG tablet    Sig: Take 1 tablet (10 mg total) by mouth 2 (two) times daily as needed.    Dispense:  60 tablet    Refill:  5  . meclizine (ANTIVERT) 25 MG tablet    Sig: Take 1 tablet (25 mg total) by mouth 3 (three) times daily as needed for dizziness.    Dispense:  60  tablet    Refill:  3  . pravastatin (PRAVACHOL) 40 MG tablet    Sig: Take 1 tablet (40 mg total) by mouth daily.    Dispense:  30 tablet    Refill:  6  . metoprolol succinate (TOPROL-XL) 25 MG 24 hr tablet    Sig: Take 1 tablet (25 mg total) by mouth daily.    Dispense:  30 tablet    Refill:  6  . tiotropium (SPIRIVA) 18 MCG inhalation capsule    Sig: Place 1 capsule (18 mcg total) into inhaler and inhale daily.    Dispense:  30 capsule    Refill:  5  . insulin aspart (novoLOG) injection 8 Units    Follow-up: Return in about 1 month (around 05/21/2018) for follow up on diabetes mellitus.   Charlott Rakes MD

## 2018-04-21 LAB — CMP14+EGFR
A/G RATIO: 1.5 (ref 1.2–2.2)
ALT: 37 IU/L — ABNORMAL HIGH (ref 0–32)
AST: 34 IU/L (ref 0–40)
Albumin: 4.3 g/dL (ref 3.5–5.5)
Alkaline Phosphatase: 138 IU/L — ABNORMAL HIGH (ref 39–117)
BUN/Creatinine Ratio: 13 (ref 9–23)
BUN: 8 mg/dL (ref 6–24)
Bilirubin Total: 0.4 mg/dL (ref 0.0–1.2)
CALCIUM: 9.3 mg/dL (ref 8.7–10.2)
CO2: 19 mmol/L — ABNORMAL LOW (ref 20–29)
Chloride: 102 mmol/L (ref 96–106)
Creatinine, Ser: 0.6 mg/dL (ref 0.57–1.00)
GFR, EST AFRICAN AMERICAN: 118 mL/min/{1.73_m2} (ref >59)
GFR, EST NON AFRICAN AMERICAN: 102 mL/min/{1.73_m2} (ref >59)
GLOBULIN, TOTAL: 2.9 g/dL (ref 1.5–4.5)
Glucose: 285 mg/dL — ABNORMAL HIGH (ref 65–99)
POTASSIUM: 4 mmol/L (ref 3.5–5.2)
SODIUM: 140 mmol/L (ref 134–144)
TOTAL PROTEIN: 7.2 g/dL (ref 6.0–8.5)

## 2018-04-21 LAB — LIPID PANEL
Chol/HDL Ratio: 5.3 ratio — ABNORMAL HIGH (ref 0.0–4.4)
Cholesterol, Total: 147 mg/dL (ref 100–199)
HDL: 28 mg/dL — ABNORMAL LOW (ref >39)
LDL CALC: 71 mg/dL (ref 0–99)
TRIGLYCERIDES: 240 mg/dL — AB (ref 0–149)
VLDL Cholesterol Cal: 48 mg/dL — ABNORMAL HIGH (ref 5–40)

## 2018-04-21 LAB — MICROALBUMIN / CREATININE URINE RATIO
Creatinine, Urine: 283.4 mg/dL
Microalb/Creat Ratio: 18.4 mg/g creat (ref 0.0–30.0)
Microalbumin, Urine: 52.2 ug/mL

## 2018-04-25 ENCOUNTER — Telehealth: Payer: Self-pay

## 2018-04-25 NOTE — Telephone Encounter (Signed)
Patient was called and informed of lab results. 

## 2018-04-25 NOTE — Telephone Encounter (Signed)
-----   Message from Hoy Register, MD sent at 04/21/2018  2:16 PM EDT ----- Cholesterol is elevated likely because she was out of her medications. Encourage on compliance with medications and low cholesterol diet.Glucose is also elevated - we had discussed management of her Diabetes at her last visit.

## 2018-04-26 ENCOUNTER — Encounter: Payer: Self-pay | Admitting: Gastroenterology

## 2018-04-27 ENCOUNTER — Ambulatory Visit (INDEPENDENT_AMBULATORY_CARE_PROVIDER_SITE_OTHER): Payer: Medicaid Other | Admitting: Cardiology

## 2018-04-27 ENCOUNTER — Encounter: Payer: Self-pay | Admitting: Cardiology

## 2018-04-27 VITALS — BP 140/88 | HR 86 | Ht 67.0 in | Wt 159.0 lb

## 2018-04-27 DIAGNOSIS — E785 Hyperlipidemia, unspecified: Secondary | ICD-10-CM

## 2018-04-27 DIAGNOSIS — E781 Pure hyperglyceridemia: Secondary | ICD-10-CM | POA: Diagnosis not present

## 2018-04-27 MED ORDER — FENOFIBRATE 48 MG PO TABS: 48.0000 mg | ORAL_TABLET | Freq: Every day | ORAL | 3 refills | Status: DC

## 2018-04-27 NOTE — Progress Notes (Signed)
04/27/2018 ADALEIGH WARF   [birth year]  [id number]  Primary Physician Hoy Register, MD Primary Cardiologist: Dr. Melburn Popper   Reason for Visit/CC: Post ED f/u after treatment of hyperglycemia  HPI:  [name] is a 57 y.o. female who is being seen today for post ED evaluation after recently being seen and treated for hyperglycemia.  Her cardiac risk factors include poorly controlled insulin-dependent diabetes, hyper lipidemia/hypertriglyceridemia, hypertension and tobacco abuse.  She smokes about a pack per day.    Her cardiac history significant for systolic heart failure.  Her EF in 2016 was 25 to 30% however subsequent cardiac catheterization at that time revealed widely patent coronary arteries.  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  She had a repeat echocardiogram in April 2018 which showed normalization of EF of 55 to 60%.  She was evaluated again in February 2019 with pressure-like chest pain and palpitations.  Stress test showed no ischemia and monitor showed no arrhythmias.  Her triglycerides however were markedly elevated in 300 range and she was started on fenofibrate.  She admits that she was previously not compliant with her insulin regimen.  Subsequently she was recently seen in the ED and was noted to have blood glucose level in the 500 range.  Fortunately there was no signs of DKA.  She was treated with insulin, restarted on her home regimen and released from the hospital.  Given her cardiac history is advised that she follow-up with cardiology.  Today in clinic she denies any chest pain or dyspnea.  She reports that she has improved compliance with her insulin.  She has concerns about her cholesterol.  Looks like they checked the lipid panel at PCP office on October 24.  Triglycerides are still elevated at 250.  LDL in the 70s.    Current Meds  Medication Sig  . ACCU-CHEK SOFTCLIX LANCETS lancets USE AS  DIRECTED DAILY UP TO THREE TIMES DAILY  . albuterol (PROVENTIL HFA;VENTOLIN HFA) 108 (90 Base) MCG/ACT inhaler Inhale 2 puffs into the lungs every 4 (four) hours as needed for wheezing or shortness of breath.  . Blood Glucose Monitoring Suppl (ACCU-CHEK AVIVA) device Use as instructed daily.  Marland Kitchen dicyclomine (BENTYL) 10 MG capsule TAKE ONE CAPSULE BY MOUTH THREE TIMES DAILY BEFORE MEALS  . diphenoxylate-atropine (LOMOTIL) 2.5-0.025 MG tablet Take 1 tablet by mouth 4 (four) times daily as needed for diarrhea or loose stools.  Marland Kitchen esomeprazole (NEXIUM) 20 MG capsule Take 1 capsule (20 mg total) by mouth daily.  . fenofibrate (TRICOR) 48 MG tablet Take 1 tablet (48 mg total) by mouth daily.  Marland Kitchen FLUoxetine (PROZAC) 40 MG capsule Take 1 capsule (40 mg total) by mouth at bedtime.  Marland Kitchen glimepiride (AMARYL) 4 MG tablet Take 2 tablets (8 mg total) by mouth daily with breakfast.  . glucose blood (ACCU-CHEK AVIVA PLUS) test strip 1 each by Other route 4 (four) times daily.  . hydrOXYzine (ATARAX/VISTARIL) 10 MG tablet Take 1 tablet (10 mg total) by mouth 2 (two) times daily as needed.  . Insulin Glargine (LANTUS SOLOSTAR) 100 UNIT/ML Solostar Pen Inject 20 Units into the skin daily at 10 pm.  . Insulin Pen Needle 31G X 5 MM MISC 1 each by Does not apply route at bedtime.  Demetra Shiner Devices (ACCU-CHEK SOFTCLIX) lancets Use as instructed daily.  . meclizine (ANTIVERT) 25 MG tablet Take 1 tablet (25 mg total) by mouth 3 (three) times daily as needed for dizziness.  Marland Kitchen  metoprolol succinate (TOPROL-XL) 25 MG 24 hr tablet Take 1 tablet (25 mg total) by mouth daily.  . pravastatin (PRAVACHOL) 40 MG tablet Take 1 tablet (40 mg total) by mouth daily.  Marland Kitchen tiotropium (SPIRIVA) 18 MCG inhalation capsule Place 1 capsule (18 mcg total) into inhaler and inhale daily.   Allergies  Allergen Reactions  . Biaxin [Clarithromycin] Anaphylaxis and Swelling  . Clarithromycin Shortness Of Breath and Swelling  . Lisinopril Swelling and  Cough    Lip edema  . Sulfa Antibiotics Anaphylaxis, Shortness Of Breath, Swelling and Hypertension  . Chlorthalidone Nausea And Vomiting and Other (See Comments)    Clammy, Tachycardia, Headache   . Daptomycin Nausea And Vomiting  . Latex Hives and Rash  . Amoxicillin-Pot Clavulanate Diarrhea    Has patient had a PCN reaction causing immediate rash, facial/tongue/throat swelling, SOB or lightheadedness with hypotension:No Has patient had a PCN reaction causing severe rash involving mucus membranes or skin necrosis:No Has patient had a PCN reaction that required hospitalization:No Has patient had a PCN reaction occurring within THE LAST 10 YEARS.  #  #  #  YES  #  #  #  If all of the above answers are "NO", then may proceed with Cephalosporin use.   . Aspirin Nausea And Vomiting and Rash    On 325mg  dosage  . Cholestyramine Nausea And Vomiting  . Dilaudid [Hydromorphone Hcl] Nausea And Vomiting  . Lasix [Furosemide] Nausea And Vomiting and Other (See Comments)    Headache    Past Medical History:  Diagnosis Date  . Anxiety   . Arthritis   . Chronic systolic heart failure (HCC)    EF 20-25% ECHO 05/2015  . COPD (chronic obstructive pulmonary disease) (HCC)   . Depression   . Diabetes mellitus without complication (HCC)    Type II  . Dysrhythmia    pt unsure of name of arrythmia - " my heart rate will drop all of a sudden" -no current treatment - atrial flutter  . Fibrillation, atrial (HCC)   . Gallstones   . GERD (gastroesophageal reflux disease)   . Headache(784.0)    migraines  . Hypertension   . Osteomyelitis (HCC)    R ankle  . Rash    arms   Family History  Problem Relation Age of Onset  . Lung cancer Father   . Heart attack Father 55       CABG x4  . Breast cancer Sister        Survivor    Past Surgical History:  Procedure Laterality Date  . ANKLE FRACTURE SURGERY Right 2015  . CARDIAC CATHETERIZATION N/A 06/24/2015   Procedure: Left Heart Cath and  Coronary Angiography;  Surgeon: [name], MD;  Location: [facility] INVASIVE CV LAB;  Service: Cardiovascular;  Laterality: N/A;  . CARPAL TUNNEL RELEASE     bil  . CHOLECYSTECTOMY  11/24/2011   Procedure: LAPAROSCOPIC CHOLECYSTECTOMY WITH INTRAOPERATIVE CHOLANGIOGRAM;  Surgeon: Clovis Pu. Cornett, MD;  Location: [facility] ORS;  Service: General;  Laterality: N/A;  Laparoscopic Cholecystectomy with Cholangiogram  . COLONOSCOPY    . ECTOPIC PREGNANCY SURGERY  many yrs ago  . HARDWARE REMOVAL Right 07/15/2015   Procedure: RIGHT ANKLE HARDWARE REMOVAL, PLACEMENT OF STIMULAN ANTIBIOTIC BEADS AND PREVENA WOUND VAC.;  Surgeon: [name], MD;  Location: [facility] OR;  Service: Orthopedics;  Laterality: Right;  . TOOTH EXTRACTION N/A 12/03/2016   Procedure: DENTAL EXTRACTIONS with Alveoloplasty;  Surgeon: Ocie Doyne, DDS;  Location: [facility] OR;  Service: Oral Surgery;  Laterality: N/A;   Social History   Socioeconomic History  . Marital status: Divorced    Spouse name: Not on file  . Number of children: Not on file  . Years of education: Not on file  . Highest education level: Not on file  Occupational History  . Not on file  Social Needs  . Financial resource strain: Not on file  . Food insecurity:    Worry: Not on file    Inability: Not on file  . Transportation needs:    Medical: Not on file    Non-medical: Not on file  Tobacco Use  . Smoking status: Light Tobacco Smoker    Packs/day: 0.00    Years: 20.00    Pack years: 0.00    Types: Cigarettes, E-cigarettes    Start date: 09/19/2016  . Smokeless tobacco: Never Used  Substance and Sexual Activity  . Alcohol use: No    Alcohol/week: 0.0 standard drinks  . Drug use: No  . Sexual activity: Not Currently    Birth control/protection: Post-menopausal  Lifestyle  . Physical activity:    Days per week: Not on file    Minutes per session: Not on file  . Stress: Not on file  Relationships  . Social connections:    Talks on phone: Not on file     Gets together: Not on file    Attends religious service: Not on file    Active member of club or organization: Not on file    Attends meetings of clubs or organizations: Not on file    Relationship status: Not on file  . Intimate partner violence:    Fear of current or ex partner: Not on file    Emotionally abused: Not on file    Physically abused: Not on file    Forced sexual activity: Not on file  Other Topics Concern  . Not on file  Social History Narrative  . Not on file     Review of Systems: General: negative for chills, fever, night sweats or weight changes.  Cardiovascular: negative for chest pain, dyspnea on exertion, edema, orthopnea, palpitations, paroxysmal nocturnal dyspnea or shortness of breath Dermatological: negative for rash Respiratory: negative for cough or wheezing Urologic: negative for hematuria Abdominal: negative for nausea, vomiting, diarrhea, bright red blood per rectum, melena, or hematemesis Neurologic: negative for visual changes, syncope, or dizziness All other systems reviewed and are otherwise negative except as noted above.   Physical Exam:  Blood pressure 140/88, pulse 86, height 5\' 7"  (1.702 m), weight 159 lb (72.1 kg), last menstrual period 07/24/2011, SpO2 94 %.  General appearance: alert, cooperative and no distress Neck: no carotid bruit and no JVD Lungs: clear to auscultation bilaterally Heart: regular rate and rhythm, S1, S2 normal, no murmur, click, rub or gallop Extremities: extremities normal, atraumatic, no cyanosis or edema Pulses: 2+ and symmetric Skin: Skin color, texture, turgor normal. No rashes or lesions Neurologic: Grossly normal  EKG not performed -- personally reviewed   ASSESSMENT AND PLAN:   1.  Diabetes: Poorly controlled.  Recent hemoglobin A1c was 12.6 in the setting of noncompliance with her insulin regimen.  She has since improved compliance with insulin and using daily as directed by her PCP and has close  follow-up with her primary care provider in another month.  We discussed the importance of tight glucose control to prevent complications including coronary artery disease.  Hemoglobin A1c goal less than 7.  2.  Hypertriglyceridemia: Patient  admits that she has been out of fenofibrate and is failed to get prescription refilled.  She is requesting refills today.  Recent lipid panel showed elevated triglyceride levels in the 250 range.  We will restart fenofibrate, 48 mg daily.  We will check fasting lipid panel and hepatic function test in 6 weeks.  3.  Hypertension: Blood pressure today in clinic is 140/88 however the patient has not yet taken her morning medications but plans to do so when she returns home.  I recommend that she continue her current regimen and her blood pressure goal in the setting of diabetes should be less than 130/80.  4.  Tobacco use: Patient smokes about a pack per day and has been smoking for 20+ years.  We had a long conversation regarding importance of smoking cessation as this is a major risk factor for heart disease.    5.  History of nonischemic cardiomyopathy: EF in 2016 was 25 to 30% however cardiac catheterization showed normal coronaries.  Her EF improved with medical management back to normal at 50 to 55%.  She denies dyspnea.  No edema.  Continue medical therapy.  Continue routine follow-up with PCP for better control of her diabetes.  She can follow-up with cardiology in a year.  Follow-up with Dr. Rodney Langton [name], [facility] 04/27/2018 10:49 AM

## 2018-04-27 NOTE — Patient Instructions (Signed)
Medication Instructions:  Your physician recommends that you continue on your current medications as directed. Please refer to the Current Medication list given to you today.  If you need a refill on your cardiac medications before your next appointment, please call your pharmacy.   Lab work: 8 WEEKS:  FASTING LIPID & LIVER If you have labs (blood work) drawn today and your tests are completely normal, you will receive your results only by: Marland Kitchen MyChart Message (if you have MyChart) OR . A paper copy in the mail If you have any lab test that is abnormal or we need to change your treatment, we will call you to review the results.  Testing/Procedures: None ordered  Follow-Up: At [facility], you and your health needs are our priority.  As part of our continuing mission to provide you with exceptional heart care, we have created designated Provider Care Teams.  These Care Teams include your primary Cardiologist (physician) and Advanced Practice Providers (APPs -  Physician Assistants and Nurse Practitioners) who all work together to provide you with the care you need, when you need it. You will need a follow up appointment in:  1 years.  Please call our office 2 months in advance to schedule this appointment.  You may see Dr. Elease Hashimoto  or one of the following Advanced Practice Providers on your designated Care Team: Tereso Newcomer, [name] Vin Henry, [place] . Berton Bon, NP  Any Other Special Instructions Will Be Listed Below (If Applicable).

## 2018-05-04 ENCOUNTER — Encounter (HOSPITAL_COMMUNITY): Payer: Self-pay | Admitting: Emergency Medicine

## 2018-05-04 ENCOUNTER — Emergency Department (HOSPITAL_COMMUNITY)
Admission: EM | Admit: 2018-05-04 | Discharge: 2018-05-04 | Disposition: A | Discharge status: Home / Self Care | Payer: Medicaid Other | Attending: Emergency Medicine | Admitting: Emergency Medicine

## 2018-05-04 ENCOUNTER — Other Ambulatory Visit: Payer: Self-pay

## 2018-05-04 DIAGNOSIS — S161XXA Strain of muscle, fascia and tendon at neck level, initial encounter: Secondary | ICD-10-CM | POA: Insufficient documentation

## 2018-05-04 DIAGNOSIS — E119 Type 2 diabetes mellitus without complications: Secondary | ICD-10-CM | POA: Diagnosis not present

## 2018-05-04 DIAGNOSIS — I110 Hypertensive heart disease with heart failure: Secondary | ICD-10-CM | POA: Insufficient documentation

## 2018-05-04 DIAGNOSIS — Y999 Unspecified external cause status: Secondary | ICD-10-CM | POA: Diagnosis not present

## 2018-05-04 DIAGNOSIS — J40 Bronchitis, not specified as acute or chronic: Secondary | ICD-10-CM | POA: Insufficient documentation

## 2018-05-04 DIAGNOSIS — J449 Chronic obstructive pulmonary disease, unspecified: Secondary | ICD-10-CM | POA: Diagnosis not present

## 2018-05-04 DIAGNOSIS — R197 Diarrhea, unspecified: Secondary | ICD-10-CM

## 2018-05-04 DIAGNOSIS — I5022 Chronic systolic (congestive) heart failure: Secondary | ICD-10-CM | POA: Insufficient documentation

## 2018-05-04 DIAGNOSIS — Z794 Long term (current) use of insulin: Secondary | ICD-10-CM | POA: Insufficient documentation

## 2018-05-04 DIAGNOSIS — Y929 Unspecified place or not applicable: Secondary | ICD-10-CM | POA: Insufficient documentation

## 2018-05-04 DIAGNOSIS — Z79899 Other long term (current) drug therapy: Secondary | ICD-10-CM | POA: Insufficient documentation

## 2018-05-04 DIAGNOSIS — F17210 Nicotine dependence, cigarettes, uncomplicated: Secondary | ICD-10-CM | POA: Insufficient documentation

## 2018-05-04 DIAGNOSIS — R112 Nausea with vomiting, unspecified: Secondary | ICD-10-CM | POA: Diagnosis not present

## 2018-05-04 DIAGNOSIS — Y939 Activity, unspecified: Secondary | ICD-10-CM | POA: Diagnosis not present

## 2018-05-04 DIAGNOSIS — S199XXA Unspecified injury of neck, initial encounter: Secondary | ICD-10-CM | POA: Diagnosis present

## 2018-05-04 DIAGNOSIS — X58XXXA Exposure to other specified factors, initial encounter: Secondary | ICD-10-CM | POA: Diagnosis not present

## 2018-05-04 MED ORDER — DEXAMETHASONE SODIUM PHOSPHATE 10 MG/ML IJ SOLN
10.0000 mg | Freq: Once | INTRAMUSCULAR | Status: AC
  Administered 2018-05-04: 10 mg via INTRAMUSCULAR
  Filled 2018-05-04: qty 1

## 2018-05-04 MED ORDER — IPRATROPIUM-ALBUTEROL 0.5-2.5 (3) MG/3ML IN SOLN
3.0000 mL | Freq: Once | RESPIRATORY_TRACT | Status: AC
  Administered 2018-05-04: 3 mL via RESPIRATORY_TRACT
  Filled 2018-05-04: qty 3

## 2018-05-04 MED ORDER — ORPHENADRINE CITRATE ER 100 MG PO TB12
100.0000 mg | ORAL_TABLET | Freq: Two times a day (BID) | ORAL | Status: DC
  Administered 2018-05-04: 100 mg via ORAL
  Filled 2018-05-04: qty 1

## 2018-05-04 MED ORDER — ORPHENADRINE CITRATE ER 100 MG PO TB12: 100.0000 mg | ORAL_TABLET | Freq: Two times a day (BID) | ORAL | 0 refills | Status: DC

## 2018-05-04 NOTE — ED Provider Notes (Signed)
[facility] EMERGENCY DEPARTMENT Provider Note   CSN: [id number] Arrival date & time: 05/04/18  1917     History   Chief Complaint Chief Complaint  Patient presents with  . Headache  . Emesis    HPI [name] is a 57 y.o. female.  HPI Patient reports that about a week ago she started having vomiting and diarrhea.  She reports she was having multiple episodes of that.  However now those have abated.  She reports that she has had some subjective fever.  Since then she has developed cough.  She reports that she has been coughing but not bringing up mucus.  Patient does have history of COPD.  She denies chest pain.  She reports she has however been having pain now along the side of the neck on the right.  She moves her finger along the paraspinous muscle bodies in the trapezius.  She reports that also give her some headache in the back of her head.  No visual changes, no confusion, no incoordination. Past Medical History:  Diagnosis Date  . Anxiety   . Arthritis   . Chronic systolic heart failure (HCC)    EF 20-25% ECHO 05/2015  . COPD (chronic obstructive pulmonary disease) (HCC)   . Depression   . Diabetes mellitus without complication (HCC)    Type II  . Dysrhythmia    pt unsure of name of arrythmia - " my heart rate will drop all of a sudden" -no current treatment - atrial flutter  . Fibrillation, atrial (HCC)   . Gallstones   . GERD (gastroesophageal reflux disease)   . Headache(784.0)    migraines  . Hypertension   . Osteomyelitis (HCC)    R ankle  . Rash    arms    Patient Active Problem List   Diagnosis Date Noted  . 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 Primary osteoarthritis of both hands 10/06/2016  . History of gastroesophageal reflux (GERD) 10/06/2016  . History of COPD 10/06/2016  . History of anxiety 10/06/2016  . History of CHF (congestive heart failure) 10/06/2016  . History of hypertension 10/06/2016  . History of cardiac  dysrhythmia 10/06/2016  . Primary osteoarthritis of both feet 10/06/2016  . Primary osteoarthritis of both knees 10/06/2016  . History of infection/ ankle post operative  10/06/2016  . Rheumatoid factor positive 10/06/2016  . De Quervain's disease (radial styloid tenosynovitis) 06/09/2016  . Tobacco abuse 05/23/2016  . Hyperglycemia without ketosis   . Bleeding hemorrhoids 11/03/2015  . Acute blood loss anemia 11/03/2015  . Hematochezia 11/01/2015  . Back pain with sciatica 09/08/2015  . Infection of bone of ankle (HCC) 07/15/2015  . Chronic systolic CHF (congestive heart failure) (HCC) 06/24/2015  . Chest pain 06/03/2015  . Hypertension   . Anxiety   . Gallstones   . GERD (gastroesophageal reflux disease)   . COPD (chronic obstructive pulmonary disease) (HCC)   . Dysrhythmia   . Rash   . Depression   . Diabetes mellitus without complication (HCC)   . Post-operative state 12/10/2011    Past Surgical History:  Procedure Laterality Date  . ANKLE FRACTURE SURGERY Right 2015  . CARDIAC CATHETERIZATION N/A 06/24/2015   Procedure: Left Heart Cath and Coronary Angiography;  Surgeon: [name], MD;  Location: [facility] INVASIVE CV LAB;  Service: Cardiovascular;  Laterality: N/A;  . CARPAL TUNNEL RELEASE     bil  . CHOLECYSTECTOMY  11/24/2011   Procedure: LAPAROSCOPIC CHOLECYSTECTOMY WITH INTRAOPERATIVE CHOLANGIOGRAM;  Surgeon: Clovis Pu. Cornett, MD;  Location: [facility] ORS;  Service: General;  Laterality: N/A;  Laparoscopic Cholecystectomy with Cholangiogram  . COLONOSCOPY    . ECTOPIC PREGNANCY SURGERY  many yrs ago  . HARDWARE REMOVAL Right 07/15/2015   Procedure: RIGHT ANKLE HARDWARE REMOVAL, PLACEMENT OF STIMULAN ANTIBIOTIC BEADS AND PREVENA WOUND VAC.;  Surgeon: [name], MD;  Location: [facility] OR;  Service: Orthopedics;  Laterality: Right;  . TOOTH EXTRACTION N/A 12/03/2016   Procedure: DENTAL EXTRACTIONS with Alveoloplasty;  Surgeon: Ocie Doyne, DDS;  Location: [facility] OR;  Service:  Oral Surgery;  Laterality: N/A;     OB History    Gravida  9   Para      Term      Preterm      AB  5   Living  4     SAB  4   TAB      Ectopic  1   Multiple      Live Births  4            Home Medications    Prior to Admission medications   Medication Sig Start Date End Date Taking? Authorizing Provider  albuterol (PROVENTIL HFA;VENTOLIN HFA) 108 (90 Base) MCG/ACT inhaler Inhale 2 puffs into the lungs every 4 (four) hours as needed for wheezing or shortness of breath. 09/12/17  Yes Eber Hong, MD  dicyclomine (BENTYL) 10 MG capsule TAKE ONE CAPSULE BY MOUTH THREE TIMES DAILY BEFORE MEALS Patient taking differently: Take 10 mg by mouth 3 (three) times daily before meals.  04/20/18  Yes Hoy Register, MD  diphenoxylate-atropine (LOMOTIL) 2.5-0.025 MG tablet Take 1 tablet by mouth 4 (four) times daily as needed for diarrhea or loose stools. 04/20/18  Yes Hoy Register, MD  esomeprazole (NEXIUM) 20 MG capsule Take 1 capsule (20 mg total) by mouth daily. 04/20/18  Yes Hoy Register, MD  fenofibrate (TRICOR) 48 MG tablet Take 1 tablet (48 mg total) by mouth daily. 04/27/18  Yes Cancelliere, Brittainy M, [name]  FLUoxetine (PROZAC) 40 MG capsule Take 1 capsule (40 mg total) by mouth at bedtime. 04/20/18  Yes Hoy Register, MD  glimepiride (AMARYL) 4 MG tablet Take 2 tablets (8 mg total) by mouth daily with breakfast. 04/20/18  Yes Newlin, Enobong, MD  hydrOXYzine (ATARAX/VISTARIL) 10 MG tablet Take 1 tablet (10 mg total) by mouth 2 (two) times daily as needed. 04/20/18  Yes Hoy Register, MD  Insulin Glargine (LANTUS SOLOSTAR) 100 UNIT/ML Solostar Pen Inject 20 Units into the skin daily at 10 pm. 04/18/18  Yes Antony Madura, [name]  meclizine (ANTIVERT) 25 MG tablet Take 1 tablet (25 mg total) by mouth 3 (three) times daily as needed for dizziness. 04/20/18  Yes Hoy Register, MD  metoprolol succinate (TOPROL-XL) 25 MG 24 hr tablet Take 1 tablet (25 mg total) by mouth  daily. 04/20/18  Yes Hoy Register, MD  pravastatin (PRAVACHOL) 40 MG tablet Take 1 tablet (40 mg total) by mouth daily. 04/20/18  Yes Hoy Register, MD  tiotropium (SPIRIVA) 18 MCG inhalation capsule Place 1 capsule (18 mcg total) into inhaler and inhale daily. 04/20/18  Yes Hoy Register, MD  ACCU-CHEK SOFTCLIX LANCETS lancets USE AS DIRECTED DAILY UP TO THREE TIMES DAILY 04/18/18   Antony Madura, [name]  Blood Glucose Monitoring Suppl (ACCU-CHEK AVIVA) device Use as instructed daily. 11/24/16   Hoy Register, MD  glucose blood (ACCU-CHEK AVIVA PLUS) test strip 1 each by Other route 4 (four) times daily. 04/19/18   Hoy Register, MD  Insulin Pen Needle 31G X 5 MM MISC 1 each by Does not apply route at bedtime. 03/29/17   Hoy Register, MD  Lancet Devices [facility]) lancets Use as instructed daily. 11/24/16   Hoy Register, MD  orphenadrine (NORFLEX) 100 MG tablet Take 1 tablet (100 mg total) by mouth 2 (two) times daily. 05/04/18   Arby Barrette, MD    Family History Family History  Problem Relation Age of Onset  . Lung cancer Father   . Heart attack Father 52       CABG x4  . Breast cancer Sister        Survivor     Social History Social History   Tobacco Use  . Smoking status: Light Tobacco Smoker    Packs/day: 0.00    Years: 20.00    Pack years: 0.00    Types: Cigarettes, E-cigarettes    Start date: 09/19/2016  . Smokeless tobacco: Never Used  Substance Use Topics  . Alcohol use: No    Alcohol/week: 0.0 standard drinks  . Drug use: No     Allergies   Biaxin [clarithromycin]; Clarithromycin; Lisinopril; Sulfa antibiotics; Chlorthalidone; Daptomycin; Latex; Amoxicillin-pot clavulanate; Aspirin; Cholestyramine; Dilaudid [hydromorphone hcl]; and Lasix [furosemide]   Review of Systems Review of Systems 10 Systems reviewed and are negative for acute change except as noted in the HPI.   Physical Exam Updated Vital Signs BP (!) 177/90 (BP Location:  Right Arm)   Pulse 63   Temp 98.5 F (36.9 C) (Oral)   Resp 16   Ht 5\' 7"  (1.702 m)   Wt 72.1 kg   LMP 07/24/2011   SpO2 99%   BMI 24.90 kg/m   Physical Exam  Constitutional: She is oriented to person, place, and time. She appears well-developed and well-nourished. No distress.  HENT:  Head: Normocephalic and atraumatic.  Mouth/Throat: Oropharynx is clear and moist.  Bilateral TMs are normal.  No erythema no bulging.  No rashes around the ears or the neck.  Eyes: Pupils are equal, round, and reactive to light. EOM are normal.  Neck:  Neck is supple without lymphadenopathy or meningismus.  She does have very reproducible para cervical muscle tenderness on the right and into the trapezius.  This is not reproducible on the left.  Cardiovascular: Normal rate, regular rhythm, normal heart sounds and intact distal pulses.  Pulmonary/Chest:  Patient has intermittent harsh cough.  Breath sounds are symmetric with expiratory wheeze bilaterally.  Good airflow.  No respiratory distress.  Abdominal: Soft. She exhibits no distension. There is no tenderness. There is no guarding.  Musculoskeletal: Normal range of motion. She exhibits no edema or tenderness.  Neurological: She is alert and oriented to person, place, and time. She exhibits normal muscle tone. Coordination normal.  Skin: Skin is warm and dry.  Psychiatric: She has a normal mood and affect.     ED Treatments / Results  Labs (all labs ordered are listed, but only abnormal results are displayed) Labs Reviewed - No data to display  EKG None  Radiology No results found.  Procedures Procedures (including critical care time)  Medications Ordered in ED Medications  orphenadrine (NORFLEX) 12 hr tablet 100 mg (has no administration in time range)  dexamethasone (DECADRON) injection 10 mg (10 mg Intramuscular Given 05/04/18 2242)  ipratropium-albuterol (DUONEB) 0.5-2.5 (3) MG/3ML nebulizer solution 3 mL (3 mLs Nebulization Given  05/04/18 2243)     Initial Impression / Assessment and Plan / ED Course  I have reviewed the triage  vital signs and the nursing notes.  Pertinent labs & imaging results that were available during my care of the patient were reviewed by me and considered in my medical decision making (see chart for details).     Patient's findings are very suggestive of a viral illness.  She had extensive vomiting and diarrhea reported from the beginning of the week but that has resolved.  Clinically patient does not appear dehydrated.  She is alert and nontoxic.  She does have cough and wheeze consistent with bronchitis.  Patient has known COPD.  At this time I have lower suspicion for acute pneumonia.  She does not have pleuritic chest pain or shortness of breath.  Her main presenting complaint is musculoskeletal pain on the neck and trapezius.  I suspect this is likely conjunction with recent vomiting and coughing.  No signs of infection on ENT exam.  Do not suspect meningitis.  Patient's mental status is bright and alert without distress.  Patient is stable for symptomatic treatment.  She will be given Norflex for muscle spasm and one IM shot of Decadron for neck pain and also COPD.  Patient reports she was recently hospitalized due to hyperglycemia but that was because she was not taking any medications and now she is on insulin and measuring her blood sugars.  She is counseled on continuing to measure her blood sugars and following up with her physician.  Final Clinical Impressions(s) / ED Diagnoses   Final diagnoses:  Strain of neck muscle, initial encounter  Nausea vomiting and diarrhea  Bronchitis    ED Discharge Orders         Ordered    orphenadrine (NORFLEX) 100 MG tablet  2 times daily     05/04/18 2244           Arby Barrette, MD 05/04/18 2300

## 2018-05-04 NOTE — ED Notes (Signed)
Pharmacy called about pt medication

## 2018-05-04 NOTE — ED Triage Notes (Signed)
Pt arrived to the ED with complaints of a headache along with N/V and diarrhea. Pt states this has been going on for a week. Pt reports she has had chills.

## 2018-05-17 ENCOUNTER — Other Ambulatory Visit: Payer: Self-pay | Admitting: Family Medicine

## 2018-05-17 DIAGNOSIS — K580 Irritable bowel syndrome with diarrhea: Secondary | ICD-10-CM

## 2018-05-22 ENCOUNTER — Ambulatory Visit: Payer: Medicaid Other | Admitting: Family Medicine

## 2018-05-22 ENCOUNTER — Other Ambulatory Visit: Payer: Self-pay

## 2018-05-22 ENCOUNTER — Ambulatory Visit: Payer: Medicaid Other | Attending: Family Medicine

## 2018-05-22 ENCOUNTER — Telehealth: Payer: Self-pay | Admitting: Pharmacist

## 2018-05-22 ENCOUNTER — Telehealth: Payer: Self-pay | Admitting: Family Medicine

## 2018-05-22 DIAGNOSIS — I10 Essential (primary) hypertension: Secondary | ICD-10-CM

## 2018-05-22 DIAGNOSIS — E1165 Type 2 diabetes mellitus with hyperglycemia: Secondary | ICD-10-CM

## 2018-05-22 DIAGNOSIS — E119 Type 2 diabetes mellitus without complications: Secondary | ICD-10-CM

## 2018-05-22 DIAGNOSIS — Z794 Long term (current) use of insulin: Principal | ICD-10-CM

## 2018-05-22 DIAGNOSIS — E7800 Pure hypercholesterolemia, unspecified: Secondary | ICD-10-CM

## 2018-05-22 MED ORDER — ACCU-CHEK FASTCLIX LANCETS MISC: 12 refills | Status: DC

## 2018-05-22 MED ORDER — ACCU-CHEK GUIDE W/DEVICE KIT: 1.0000 | PACK | Freq: Four times a day (QID) | 0 refills | Status: DC

## 2018-05-22 MED ORDER — GLUCOSE BLOOD VI STRP: ORAL_STRIP | 12 refills | Status: DC

## 2018-05-22 MED ORDER — INSULIN GLARGINE 100 UNIT/ML SOLOSTAR PEN: PEN_INJECTOR | SUBCUTANEOUS | 0 refills | Status: DC

## 2018-05-22 NOTE — Telephone Encounter (Signed)
Meter and supplies have been filled and sent to correct pharmacy.

## 2018-05-22 NOTE — Telephone Encounter (Signed)
Pt presents with concerns over high blood sugars at home. States that she has FBG in the 250s. Advised pt to increase Lantus to 25 units daily. She will titrate by 2 units every 3 days if FBG remains above 130. Max 35 units. Also provided new meter as hers is not working.  She sees Dr. Alvis Lemmings 06/02/18.

## 2018-05-22 NOTE — Telephone Encounter (Signed)
Patient called because she needs a new glucometer. Patient says current meter is no longer working. Please follow up with patient.

## 2018-05-23 LAB — LIPID PANEL
CHOLESTEROL TOTAL: 202 mg/dL — AB (ref 100–199)
Chol/HDL Ratio: 6.7 ratio — ABNORMAL HIGH (ref 0.0–4.4)
HDL: 30 mg/dL — AB (ref >39)
LDL Calculated: 125 mg/dL — ABNORMAL HIGH (ref 0–99)
Triglycerides: 234 mg/dL — ABNORMAL HIGH (ref 0–149)
VLDL Cholesterol Cal: 47 mg/dL — ABNORMAL HIGH (ref 5–40)

## 2018-05-23 NOTE — Telephone Encounter (Signed)
Noted  

## 2018-05-24 ENCOUNTER — Telehealth: Payer: Self-pay

## 2018-05-24 NOTE — Telephone Encounter (Signed)
Patient was called and informed of lab results. 

## 2018-05-24 NOTE — Telephone Encounter (Signed)
-----   Message from Hoy Register, MD sent at 05/24/2018  2:08 PM EST ----- Cholesterol is elevated compared to 1 month ago.  Unsure if this was fasting sample and will make no regimen changes at this time but she is advised to comply with a low-cholesterol diet and lifestyle modifications.  Please encouraged to comply with fish oil capsules due to elevated triglyceride

## 2018-05-24 NOTE — Telephone Encounter (Signed)
-----   Message from Enobong Newlin, MD sent at 05/24/2018  2:08 PM EST ----- Cholesterol is elevated compared to 1 month ago.  Unsure if this was fasting sample and will make no regimen changes at this time but she is advised to comply with a low-cholesterol diet and lifestyle modifications.  Please encouraged to comply with fish oil capsules due to elevated triglyceride 

## 2018-05-29 ENCOUNTER — Ambulatory Visit: Payer: Medicaid Other | Admitting: Gastroenterology

## 2018-05-29 NOTE — Progress Notes (Deleted)
Referring Provider: Charlott Rakes, MD Primary Care Physician:  Charlott Rakes, MD   Reason for Consultation:  ***   IMPRESSION:  ***  PLAN: ***   HPI: [name] is a 57 y.o. female seen in consultation for IBS and diarrhea.  History is obtained through the patient and review of EPIC. She has poorly controlled insulin-dependent diabetes, hyperlipidemia/hypertriglyceridemia, hypertension, tobacco habituation, systolic heart failure.  No prior endoscopy or abdominal imaging results available in EPIC   Quality: Amount: Duration: Timing: Progression: Chronicity: Context: Similar prior episodes: Relieved by: Worsened by: Effective treatments: Ineffective treatments: Associated symptoms: Risks factors:   Past Medical History:  Diagnosis Date  . Anxiety   . Arthritis   . Chronic systolic heart failure (HCC)    EF 20-25% ECHO 05/2015  . COPD (chronic obstructive pulmonary disease) ([place])   . Depression   . Diabetes mellitus without complication ([place])    Type II  . Dysrhythmia    pt unsure of name of arrythmia - " my heart rate will drop all of a sudden" -no current treatment - atrial flutter  . Fibrillation, atrial ([place])   . Gallstones   . GERD (gastroesophageal reflux disease)   . Headache(784.0)    migraines  . Hypertension   . Osteomyelitis (HCC)    R ankle  . Rash    arms    Past Surgical History:  Procedure Laterality Date  . ANKLE FRACTURE SURGERY Right 2015  . CARDIAC CATHETERIZATION N/A 06/24/2015   Procedure: Left Heart Cath and Coronary Angiography;  Surgeon: Belva Crome, MD;  Location: [place] CV LAB;  Service: Cardiovascular;  Laterality: N/A;  . CARPAL TUNNEL RELEASE     bil  . CHOLECYSTECTOMY  11/24/2011   Procedure: LAPAROSCOPIC CHOLECYSTECTOMY WITH INTRAOPERATIVE CHOLANGIOGRAM;  Surgeon: Joyice Faster. Cornett, MD;  Location: [facility] ORS;  Service: General;  Laterality: N/A;  Laparoscopic Cholecystectomy with Cholangiogram  .  COLONOSCOPY    . ECTOPIC PREGNANCY SURGERY  many yrs ago  . HARDWARE REMOVAL Right 07/15/2015   Procedure: RIGHT ANKLE HARDWARE REMOVAL, PLACEMENT OF STIMULAN ANTIBIOTIC BEADS AND PREVENA WOUND VAC.;  Surgeon: Meredith Pel, MD;  Location: [place];  Service: Orthopedics;  Laterality: Right;  . TOOTH EXTRACTION N/A 12/03/2016   Procedure: DENTAL EXTRACTIONS with Alveoloplasty;  Surgeon: Diona Browner, DDS;  Location: [place];  Service: Oral Surgery;  Laterality: N/A;    Current Outpatient Medications  Medication Sig Dispense Refill  . ACCU-CHEK FASTCLIX LANCETS MISC Use as directed to test blood sugar up to four times daily 100 each 12  . ACCU-CHEK SOFTCLIX LANCETS lancets USE AS DIRECTED DAILY UP TO THREE TIMES DAILY 100 each 0  . albuterol (PROVENTIL HFA;VENTOLIN HFA) 108 (90 Base) MCG/ACT inhaler Inhale 2 puffs into the lungs every 4 (four) hours as needed for wheezing or shortness of breath. 1 Inhaler 3  . Blood Glucose Monitoring Suppl (ACCU-CHEK GUIDE) w/Device KIT 1 each by Does not apply route 4 (four) times daily. 1 kit 0  . dicyclomine (BENTYL) 10 MG capsule TAKE ONE CAPSULE BY MOUTH THREE TIMES DAILY BEFORE MEALS (Patient taking differently: Take 10 mg by mouth 3 (three) times daily before meals. ) 90 capsule 5  . diphenoxylate-atropine (LOMOTIL) 2.5-0.025 MG tablet Take 1 tablet by mouth 4 (four) times daily as needed for diarrhea or loose stools. 60 tablet 0  . esomeprazole (NEXIUM) 20 MG capsule Take 1 capsule (20 mg total) by mouth daily. 30 capsule 5  . fenofibrate (TRICOR) 48  MG tablet Take 1 tablet (48 mg total) by mouth daily. 90 tablet 3  . FLUoxetine (PROZAC) 40 MG capsule Take 1 capsule (40 mg total) by mouth at bedtime. 30 capsule 5  . glimepiride (AMARYL) 4 MG tablet Take 2 tablets (8 mg total) by mouth daily with breakfast. 60 tablet 6  . glucose blood (ACCU-CHEK GUIDE) test strip Use as instructed to test blood sugar up to four times daily 100 each 12  . hydrOXYzine  (ATARAX/VISTARIL) 10 MG tablet Take 1 tablet (10 mg total) by mouth 2 (two) times daily as needed. 60 tablet 5  . Insulin Glargine (LANTUS SOLOSTAR) 100 UNIT/ML Solostar Pen Inject 25 units daily. Increase by 2 units every 3 days if sugar is above 130. Max 35 units. 3 pen 0  . Insulin Pen Needle 31G X 5 MM MISC 1 each by Does not apply route at bedtime. 30 each 5  . Lancet Devices (ACCU-CHEK SOFTCLIX) lancets Use as instructed daily. 1 each 5  . meclizine (ANTIVERT) 25 MG tablet Take 1 tablet (25 mg total) by mouth 3 (three) times daily as needed for dizziness. 60 tablet 3  . metoprolol succinate (TOPROL-XL) 25 MG 24 hr tablet Take 1 tablet (25 mg total) by mouth daily. 30 tablet 6  . orphenadrine (NORFLEX) 100 MG tablet Take 1 tablet (100 mg total) by mouth 2 (two) times daily. 30 tablet 0  . pravastatin (PRAVACHOL) 40 MG tablet Take 1 tablet (40 mg total) by mouth daily. 30 tablet 6  . tiotropium (SPIRIVA) 18 MCG inhalation capsule Place 1 capsule (18 mcg total) into inhaler and inhale daily. 30 capsule 5   No current facility-administered medications for this visit.    Facility-Administered Medications Ordered in Other Visits  Medication Dose Route Frequency Provider Last Rate Last Dose  . regadenoson (LEXISCAN) injection SOLN 0.4 mg  0.4 mg Intravenous Once Dorothy Spark, MD      . technetium tetrofosmin (TC-MYOVIEW) injection 33.8 millicurie  25.0 millicurie Intravenous Once PRN Dorothy Spark, MD        Allergies as of 05/30/2018 - Review Complete 05/04/2018  Allergen Reaction Noted  . Biaxin [clarithromycin] Anaphylaxis and Swelling 04/08/2013  . Clarithromycin Shortness Of Breath and Swelling 09/05/2011  . Lisinopril Swelling and Cough 09/05/2011  . Sulfa antibiotics Anaphylaxis, Shortness Of Breath, Swelling, and Hypertension 09/05/2011  . Chlorthalidone Nausea And Vomiting and Other (See Comments) 10/08/2011  . Daptomycin Nausea And Vomiting 08/07/2015  . Latex Hives and  Rash 09/05/2011  . Amoxicillin-pot clavulanate Diarrhea 09/05/2011  . Aspirin Nausea And Vomiting and Rash 09/05/2011  . Cholestyramine Nausea And Vomiting 04/10/2015  . Dilaudid [hydromorphone hcl] Nausea And Vomiting 09/05/2011  . Lasix [furosemide] Nausea And Vomiting and Other (See Comments) 10/08/2011    Family History  Problem Relation Age of Onset  . Lung cancer Father   . Heart attack Father 88       CABG x4  . Breast cancer Sister        Survivor     Social History   Socioeconomic History  . Marital status: Divorced    Spouse name: Not on file  . Number of children: Not on file  . Years of education: Not on file  . Highest education level: Not on file  Occupational History  . Not on file  Social Needs  . Financial resource strain: Not on file  . Food insecurity:    Worry: Not on file    Inability: Not on  file  . Transportation needs:    Medical: Not on file    Non-medical: Not on file  Tobacco Use  . Smoking status: Light Tobacco Smoker    Packs/day: 0.00    Years: 20.00    Pack years: 0.00    Types: Cigarettes, E-cigarettes    Start date: 09/19/2016  . Smokeless tobacco: Never Used  Substance and Sexual Activity  . Alcohol use: No    Alcohol/week: 0.0 standard drinks  . Drug use: No  . Sexual activity: Not Currently    Birth control/protection: Post-menopausal  Lifestyle  . Physical activity:    Days per week: Not on file    Minutes per session: Not on file  . Stress: Not on file  Relationships  . Social connections:    Talks on phone: Not on file    Gets together: Not on file    Attends religious service: Not on file    Active member of club or organization: Not on file    Attends meetings of clubs or organizations: Not on file    Relationship status: Not on file  . Intimate partner violence:    Fear of current or ex partner: Not on file    Emotionally abused: Not on file    Physically abused: Not on file    Forced sexual activity: Not on  file  Other Topics Concern  . Not on file  Social History Narrative  . Not on file    Review of Systems: 12 system ROS is negative except as noted above.  There were no vitals filed for this visit.  Physical Exam: Vital signs were reviewed. General:   Alert, well-nourished, pleasant and cooperative in NAD Head:  Normocephalic and atraumatic. Eyes:  Sclera clear, no icterus.   Conjunctiva pink. Mouth:  No deformity or lesions.   Neck:  Supple; no thyromegaly. Lungs:  Clear throughout to auscultation.   No wheezes.  Heart:  Regular rate and rhythm; no murmurs Abdomen:  Soft, nontender, normal bowel sounds. No rebound or guarding. No hepatosplenomegaly Rectal:  Deferred  Msk:  Symmetrical without gross deformities. Extremities:  No gross deformities or edema. Neurologic:  Alert and  oriented x4;  grossly nonfocal Skin:  No rash or bruise. Psych:  Alert and cooperative. Normal mood and affect.   Ralonda Tartt L. Tarri Glenn, MD, MPH [facility] 05/29/2018, 5:14 PM

## 2018-05-30 ENCOUNTER — Ambulatory Visit: Payer: Medicaid Other | Admitting: Gastroenterology

## 2018-06-02 ENCOUNTER — Other Ambulatory Visit: Payer: Self-pay | Admitting: Family Medicine

## 2018-06-02 ENCOUNTER — Encounter: Payer: Self-pay | Admitting: Family Medicine

## 2018-06-02 ENCOUNTER — Ambulatory Visit: Payer: Medicaid Other | Attending: Family Medicine | Admitting: Family Medicine

## 2018-06-02 ENCOUNTER — Ambulatory Visit
Admission: RE | Admit: 2018-06-02 | Discharge: 2018-06-02 | Disposition: A | Discharge status: Home / Self Care | Payer: Medicaid Other | Source: Ambulatory Visit | Attending: Family Medicine | Admitting: Family Medicine

## 2018-06-02 VITALS — BP 135/78 | HR 64 | Temp 98.0°F | Ht 67.0 in | Wt 161.6 lb

## 2018-06-02 DIAGNOSIS — L299 Pruritus, unspecified: Secondary | ICD-10-CM | POA: Insufficient documentation

## 2018-06-02 DIAGNOSIS — K219 Gastro-esophageal reflux disease without esophagitis: Secondary | ICD-10-CM | POA: Insufficient documentation

## 2018-06-02 DIAGNOSIS — E1165 Type 2 diabetes mellitus with hyperglycemia: Secondary | ICD-10-CM | POA: Diagnosis present

## 2018-06-02 DIAGNOSIS — Z882 Allergy status to sulfonamides status: Secondary | ICD-10-CM | POA: Diagnosis not present

## 2018-06-02 DIAGNOSIS — Z886 Allergy status to analgesic agent status: Secondary | ICD-10-CM | POA: Insufficient documentation

## 2018-06-02 DIAGNOSIS — Z23 Encounter for immunization: Secondary | ICD-10-CM

## 2018-06-02 DIAGNOSIS — I5022 Chronic systolic (congestive) heart failure: Secondary | ICD-10-CM | POA: Diagnosis not present

## 2018-06-02 DIAGNOSIS — G44209 Tension-type headache, unspecified, not intractable: Secondary | ICD-10-CM | POA: Diagnosis not present

## 2018-06-02 DIAGNOSIS — M1990 Unspecified osteoarthritis, unspecified site: Secondary | ICD-10-CM | POA: Diagnosis not present

## 2018-06-02 DIAGNOSIS — F419 Anxiety disorder, unspecified: Secondary | ICD-10-CM | POA: Insufficient documentation

## 2018-06-02 DIAGNOSIS — F329 Major depressive disorder, single episode, unspecified: Secondary | ICD-10-CM | POA: Insufficient documentation

## 2018-06-02 DIAGNOSIS — Z888 Allergy status to other drugs, medicaments and biological substances status: Secondary | ICD-10-CM | POA: Insufficient documentation

## 2018-06-02 DIAGNOSIS — Z885 Allergy status to narcotic agent status: Secondary | ICD-10-CM | POA: Diagnosis not present

## 2018-06-02 DIAGNOSIS — Z1239 Encounter for other screening for malignant neoplasm of breast: Secondary | ICD-10-CM

## 2018-06-02 DIAGNOSIS — Z880 Allergy status to penicillin: Secondary | ICD-10-CM | POA: Insufficient documentation

## 2018-06-02 DIAGNOSIS — I110 Hypertensive heart disease with heart failure: Secondary | ICD-10-CM | POA: Insufficient documentation

## 2018-06-02 DIAGNOSIS — M79675 Pain in left toe(s): Secondary | ICD-10-CM | POA: Diagnosis not present

## 2018-06-02 DIAGNOSIS — J449 Chronic obstructive pulmonary disease, unspecified: Secondary | ICD-10-CM | POA: Insufficient documentation

## 2018-06-02 DIAGNOSIS — M79676 Pain in unspecified toe(s): Secondary | ICD-10-CM | POA: Diagnosis not present

## 2018-06-02 DIAGNOSIS — M79674 Pain in right toe(s): Secondary | ICD-10-CM | POA: Insufficient documentation

## 2018-06-02 DIAGNOSIS — Z794 Long term (current) use of insulin: Secondary | ICD-10-CM | POA: Insufficient documentation

## 2018-06-02 DIAGNOSIS — Z79899 Other long term (current) drug therapy: Secondary | ICD-10-CM | POA: Insufficient documentation

## 2018-06-02 LAB — GLUCOSE, POCT (MANUAL RESULT ENTRY): POC Glucose: 317 mg/dl — AB (ref 70–99)

## 2018-06-02 MED ORDER — DOXYCYCLINE HYCLATE 100 MG PO TABS: 100.0000 mg | ORAL_TABLET | Freq: Two times a day (BID) | ORAL | 0 refills | Status: DC

## 2018-06-02 MED ORDER — INSULIN GLARGINE 100 UNIT/ML SOLOSTAR PEN: 35.0000 [IU] | PEN_INJECTOR | Freq: Every day | SUBCUTANEOUS | 3 refills | Status: DC

## 2018-06-02 MED ORDER — HYDROXYZINE HCL 10 MG PO TABS: 10.0000 mg | ORAL_TABLET | Freq: Two times a day (BID) | ORAL | 5 refills | Status: DC | PRN

## 2018-06-02 NOTE — Progress Notes (Signed)
Subjective:  Patient ID: [name], female    DOB: Nov 19, 1960  Age: 57 y.o. MRN: [id number]  CC: Diabetes   HPI [name] is a 57 year old female with a history of type 2 diabetes mellitus (A1c 12.6), hypertension, CHF (EF 55-60%  2-D echo of 09/2016 which has improved from 25%-30% previously), anxiety, GERD who presents today for follow-up visit. She was seen by the clinical pharmacist and increased her Lantus every 4th day and is now at 30 units of Lantus. Fasting sugars are around 170 and bedtime sugars 250. She has bilateral big toenail pain and would like to see a Podiatrist as it tender to slight touch but denies fever or discharge. Also has intermittent headaches and endorses being stressed from caring for her Yolanda Bonine coupled with other family stressors.  Past Medical History:  Diagnosis Date  . Anxiety   . Arthritis   . Chronic systolic heart failure (HCC)    EF 20-25% ECHO 05/2015  . COPD (chronic obstructive pulmonary disease) ([place])   . Depression   . Diabetes mellitus without complication ([place])    Type II  . Dysrhythmia    pt unsure of name of arrythmia - " my heart rate will drop all of a sudden" -no current treatment - atrial flutter  . Fibrillation, atrial ([place])   . Gallstones   . GERD (gastroesophageal reflux disease)   . Headache(784.0)    migraines  . Hypertension   . Osteomyelitis (HCC)    R ankle  . Rash    arms    Past Surgical History:  Procedure Laterality Date  . ANKLE FRACTURE SURGERY Right 2015  . CARDIAC CATHETERIZATION N/A 06/24/2015   Procedure: Left Heart Cath and Coronary Angiography;  Surgeon: Belva Crome, MD;  Location: [facility];  Service: Cardiovascular;  Laterality: N/A;  . CARPAL TUNNEL RELEASE     bil  . CHOLECYSTECTOMY  11/24/2011   Procedure: LAPAROSCOPIC CHOLECYSTECTOMY WITH INTRAOPERATIVE CHOLANGIOGRAM;  Surgeon: Joyice Faster. Cornett, MD;  Location: [facility];  Service: General;  Laterality: N/A;   Laparoscopic Cholecystectomy with Cholangiogram  . COLONOSCOPY    . ECTOPIC PREGNANCY SURGERY  many yrs ago  . HARDWARE REMOVAL Right 07/15/2015   Procedure: RIGHT ANKLE HARDWARE REMOVAL, PLACEMENT OF STIMULAN ANTIBIOTIC BEADS AND PREVENA WOUND VAC.;  Surgeon: Meredith Pel, MD;  Location: [facility];  Service: Orthopedics;  Laterality: Right;  . TOOTH EXTRACTION N/A 12/03/2016   Procedure: DENTAL EXTRACTIONS with Alveoloplasty;  Surgeon: Diona Browner, DDS;  Location: [facility];  Service: Oral Surgery;  Laterality: N/A;    Allergies  Allergen Reactions  . Biaxin [Clarithromycin] Anaphylaxis and Swelling  . Clarithromycin Shortness Of Breath and Swelling  . Lisinopril Swelling and Cough    Lip edema  . Sulfa Antibiotics Anaphylaxis, Shortness Of Breath, Swelling and Hypertension  . Chlorthalidone Nausea And Vomiting and Other (See Comments)    Clammy, Tachycardia, Headache   . Daptomycin Nausea And Vomiting  . Latex Hives and Rash  . Amoxicillin-Pot Clavulanate Diarrhea    Has patient had a PCN reaction causing immediate rash, facial/tongue/throat swelling, SOB or lightheadedness with hypotension:No Has patient had a PCN reaction causing severe rash involving mucus membranes or skin necrosis:No Has patient had a PCN reaction that required hospitalization:No Has patient had a PCN reaction occurring within THE LAST 10 YEARS.  #  #  #  YES  #  #  #  If all of the above answers are "NO", then may  proceed with Cephalosporin use.   . Aspirin Nausea And Vomiting and Rash    On 351m dosage  . Cholestyramine Nausea And Vomiting  . Dilaudid [Hydromorphone Hcl] Nausea And Vomiting  . Lasix [Furosemide] Nausea And Vomiting and Other (See Comments)    Headache      Outpatient Medications Prior to Visit  Medication Sig Dispense Refill  . ACCU-CHEK FASTCLIX LANCETS MISC Use as directed to test blood sugar up to four times daily 100 each 12  . ACCU-CHEK SOFTCLIX LANCETS lancets USE AS DIRECTED  DAILY UP TO THREE TIMES DAILY 100 each 0  . albuterol (PROVENTIL HFA;VENTOLIN HFA) 108 (90 Base) MCG/ACT inhaler Inhale 2 puffs into the lungs every 4 (four) hours as needed for wheezing or shortness of breath. 1 Inhaler 3  . Blood Glucose Monitoring Suppl (ACCU-CHEK GUIDE) w/Device KIT 1 each by Does not apply route 4 (four) times daily. 1 kit 0  . dicyclomine (BENTYL) 10 MG capsule TAKE ONE CAPSULE BY MOUTH THREE TIMES DAILY BEFORE MEALS (Patient taking differently: Take 10 mg by mouth 3 (three) times daily before meals. ) 90 capsule 5  . diphenoxylate-atropine (LOMOTIL) 2.5-0.025 MG tablet Take 1 tablet by mouth 4 (four) times daily as needed for diarrhea or loose stools. 60 tablet 0  . esomeprazole (NEXIUM) 20 MG capsule Take 1 capsule (20 mg total) by mouth daily. 30 capsule 5  . fenofibrate (TRICOR) 48 MG tablet Take 1 tablet (48 mg total) by mouth daily. 90 tablet 3  . FLUoxetine (PROZAC) 40 MG capsule Take 1 capsule (40 mg total) by mouth at bedtime. 30 capsule 5  . glimepiride (AMARYL) 4 MG tablet Take 2 tablets (8 mg total) by mouth daily with breakfast. 60 tablet 6  . glucose blood (ACCU-CHEK GUIDE) test strip Use as instructed to test blood sugar up to four times daily 100 each 12  . Insulin Pen Needle 31G X 5 MM MISC 1 each by Does not apply route at bedtime. 30 each 5  . Lancet Devices (ACCU-CHEK SOFTCLIX) lancets Use as instructed daily. 1 each 5  . meclizine (ANTIVERT) 25 MG tablet Take 1 tablet (25 mg total) by mouth 3 (three) times daily as needed for dizziness. 60 tablet 3  . metoprolol succinate (TOPROL-XL) 25 MG 24 hr tablet Take 1 tablet (25 mg total) by mouth daily. 30 tablet 6  . pravastatin (PRAVACHOL) 40 MG tablet Take 1 tablet (40 mg total) by mouth daily. 30 tablet 6  . tiotropium (SPIRIVA) 18 MCG inhalation capsule Place 1 capsule (18 mcg total) into inhaler and inhale daily. 30 capsule 5  . hydrOXYzine (ATARAX/VISTARIL) 10 MG tablet Take 1 tablet (10 mg total) by mouth 2  (two) times daily as needed. 60 tablet 5  . Insulin Glargine (LANTUS SOLOSTAR) 100 UNIT/ML Solostar Pen Inject 25 units daily. Increase by 2 units every 3 days if sugar is above 130. Max 35 units. 3 pen 0  . orphenadrine (NORFLEX) 100 MG tablet Take 1 tablet (100 mg total) by mouth 2 (two) times daily. (Patient not taking: Reported on 06/02/2018) 30 tablet 0   Facility-Administered Medications Prior to Visit  Medication Dose Route Frequency Provider Last Rate Last Dose  . regadenoson (LEXISCAN) injection SOLN 0.4 mg  0.4 mg Intravenous Once NDorothy Spark MD      . technetium tetrofosmin (TC-MYOVIEW) injection 308.1millicurie  344.8millicurie Intravenous Once PRN NDorothy Spark MD        ROS Review of Systems  Constitutional: Negative for activity change, appetite change and fatigue.  HENT: Negative for congestion, sinus pressure and sore throat.   Eyes: Negative for visual disturbance.  Respiratory: Negative for cough, chest tightness, shortness of breath and wheezing.   Cardiovascular: Negative for chest pain and palpitations.  Gastrointestinal: Negative for abdominal distention, abdominal pain and constipation.  Endocrine: Negative for polydipsia.  Genitourinary: Negative for dysuria and frequency.  Musculoskeletal: Negative for arthralgias and back pain.  Skin: Negative for rash.  Neurological: Positive for headaches. Negative for tremors, light-headedness and numbness.  Hematological: Does not bruise/bleed easily.  Psychiatric/Behavioral: Negative for agitation and behavioral problems.    Objective:  BP 135/78   Pulse 64   Temp 98 F (36.7 C) (Oral)   Ht _0  (1.702 m)   Wt 161 lb 9.6 oz (73.3 kg)   LMP 07/24/2011   SpO2 97%   BMI 25.31 kg/m   BP/Weight 06/02/2018 05/04/2018 16/03/9603  Systolic BP 540 981 191  Diastolic BP 78 90 88  Wt. (Lbs) 161.6 159 159  BMI 25.31 24.9 24.9      Physical Exam  Constitutional: She is oriented to person, place, and  time. She appears well-developed and well-nourished.  Cardiovascular: Normal rate, normal heart sounds and intact distal pulses.  No murmur heard. Pulmonary/Chest: Effort normal and breath sounds normal. She has no wheezes. She has no rales. She exhibits no tenderness.  Abdominal: Soft. Bowel sounds are normal. She exhibits no distension and no mass. There is no tenderness.  Musculoskeletal: Normal range of motion.  Neurological: She is alert and oriented to person, place, and time.  Skin: Skin is warm and dry.  Tenderness on palpation of b/l big toenail,no erythema or purulent discharge  Psychiatric: She has a normal mood and affect.     Lab Results  Component Value Date   HGBA1C 12.6 (A) 04/20/2018    Assessment & Plan:   1. Type 2 diabetes mellitus with hyperglycemia, with long-term current use of insulin (HCC) Uncontrolled with A1c of 12.6 Increase Lantus dose to 35 units and advised to up titrate by 2 units every 4th day until sugars are at goal - POCT glucose (manual entry) - Insulin Glargine (LANTUS SOLOSTAR) 100 UNIT/ML Solostar Pen; Inject 35 Units into the skin at bedtime. I  Dispense: 3 pen; Refill: 3  2. Pruritus - hydrOXYzine (ATARAX/VISTARIL) 10 MG tablet; Take 1 tablet (10 mg total) by mouth 2 (two) times daily as needed.  Dispense: 60 tablet; Refill: 5  3. Anxiety Stable - hydrOXYzine (ATARAX/VISTARIL) 10 MG tablet; Take 1 tablet (10 mg total) by mouth 2 (two) times daily as needed.  Dispense: 60 tablet; Refill: 5  4. Pain around toenail Placed on antibiotics,warm soaks - Ambulatory referral to Podiatry  5. Tension headache Tylenol prn   Meds ordered this encounter  Medications  . Insulin Glargine (LANTUS SOLOSTAR) 100 UNIT/ML Solostar Pen    Sig: Inject 35 Units into the skin at bedtime. I    Dispense:  3 pen    Refill:  3    Discontinue previous dose  . hydrOXYzine (ATARAX/VISTARIL) 10 MG tablet    Sig: Take 1 tablet (10 mg total) by mouth 2 (two)  times daily as needed.    Dispense:  60 tablet    Refill:  5  . doxycycline (VIBRA-TABS) 100 MG tablet    Sig: Take 1 tablet (100 mg total) by mouth 2 (two) times daily.    Dispense:  20 tablet  Refill:  0    Follow-up: Return in about 3 months (around 09/01/2018) for Follow-up of chronic medical conditions.   Charlott Rakes MD

## 2018-06-02 NOTE — Progress Notes (Signed)
Patient is having pain in feet and legs.  Patient has been having bad headaches.

## 2018-06-05 ENCOUNTER — Telehealth: Payer: Self-pay

## 2018-06-05 NOTE — Telephone Encounter (Signed)
-----   Message from Hoy Register, MD sent at 06/02/2018  5:35 PM EST ----- Mammogram is negative for malignancy

## 2018-06-05 NOTE — Telephone Encounter (Signed)
Patient was called and informed of lab results. 

## 2018-06-15 ENCOUNTER — Ambulatory Visit: Payer: Medicaid Other | Admitting: Podiatry

## 2018-06-20 ENCOUNTER — Other Ambulatory Visit: Payer: Self-pay | Admitting: Family Medicine

## 2018-06-20 DIAGNOSIS — H8113 Benign paroxysmal vertigo, bilateral: Secondary | ICD-10-CM

## 2018-06-23 ENCOUNTER — Other Ambulatory Visit: Payer: Medicaid Other | Admitting: *Deleted

## 2018-06-23 DIAGNOSIS — E785 Hyperlipidemia, unspecified: Secondary | ICD-10-CM

## 2018-06-24 LAB — HEPATIC FUNCTION PANEL
ALK PHOS: 102 IU/L (ref 39–117)
ALT: 29 IU/L (ref 0–32)
AST: 25 IU/L (ref 0–40)
Albumin: 4 g/dL (ref 3.5–5.5)
Bilirubin Total: 0.3 mg/dL (ref 0.0–1.2)
Bilirubin, Direct: 0.09 mg/dL (ref 0.00–0.40)
Total Protein: 6.6 g/dL (ref 6.0–8.5)

## 2018-06-24 LAB — LIPID PANEL
CHOL/HDL RATIO: 4.8 ratio — AB (ref 0.0–4.4)
Cholesterol, Total: 150 mg/dL (ref 100–199)
HDL: 31 mg/dL — ABNORMAL LOW (ref >39)
LDL Calculated: 83 mg/dL (ref 0–99)
TRIGLYCERIDES: 178 mg/dL — AB (ref 0–149)
VLDL Cholesterol Cal: 36 mg/dL (ref 5–40)

## 2018-06-26 ENCOUNTER — Telehealth: Payer: Self-pay

## 2018-06-26 NOTE — Telephone Encounter (Signed)
Follow Up:     Returning Michael's call, concerning her lab results.Marland Kitchen

## 2018-06-26 NOTE — Telephone Encounter (Signed)
Notes recorded by Sigurd Sos, RN on 06/26/2018 at 1:17 PM EST [id number] 12/30 ------

## 2018-06-26 NOTE — Telephone Encounter (Signed)
Informed pt of results. Pt verbalized understanding. 

## 2018-06-26 NOTE — Telephone Encounter (Signed)
-----   Message from Leone Brand, NP sent at 06/26/2018 12:57 PM EST ----- Labs have improved continue current meds, decrease sweets and breads

## 2018-06-30 ENCOUNTER — Other Ambulatory Visit: Payer: Self-pay | Admitting: Family Medicine

## 2018-06-30 DIAGNOSIS — K580 Irritable bowel syndrome with diarrhea: Secondary | ICD-10-CM

## 2018-07-01 NOTE — Telephone Encounter (Signed)
Pt last seen: 06/02/18 Next appt: 09/05/18 Last RX written on: 05/22/18 Date of original fill: 05/22/18 Date of refill(s): n/a  No other controlled substances were filled during this time, please refill if appropriate.

## 2018-07-05 ENCOUNTER — Ambulatory Visit: Payer: Medicaid Other | Admitting: Gastroenterology

## 2018-07-06 ENCOUNTER — Ambulatory Visit: Payer: Medicaid Other | Admitting: Podiatry

## 2018-07-26 ENCOUNTER — Ambulatory Visit: Payer: Medicaid Other | Admitting: Gastroenterology

## 2018-07-26 ENCOUNTER — Other Ambulatory Visit: Payer: Self-pay | Admitting: Family Medicine

## 2018-07-26 DIAGNOSIS — K580 Irritable bowel syndrome with diarrhea: Secondary | ICD-10-CM

## 2018-07-26 NOTE — Progress Notes (Deleted)
Referring Provider: Charlott Rakes, MD Primary Care Physician:  Charlott Rakes, MD   Reason for Consultation: IBS diarrhea   IMPRESSION:  ***  PLAN: ***   HPI: [name] is a 58 y.o. female seen in consultation at the request of Dr. Margarita Rana for further evaluation of IBS diarrhea.  The history is obtained through the patient and review of her electronic health record.  She has type 2 diabetes mellitus (A1c 12.6), hypertension, CHF (EF 55-60%  2-D echo of 09/2016 which has improved from 25%-30% previously), anxiety, GERD.   She complains of chronic Diarrhea from her IBS uncontrolled on Bentyl and Imodium. Denies abdominal cramps. Referred to GI last year but referral did not go through.  No prior endoscopic evaluation.  No recent stool studies. She had normal liver enzymes 06/23/2018 with an AST 25, ALT 29, alk phos 102, total bilirubin 0.3.   Quality: Amount: Duration: Timing: Progression: Chronicity: Context: Similar prior episodes: Relieved by: Worsened by: Effective treatments: Ineffective treatments: Associated symptoms: Risks factors:   Past Medical History:  Diagnosis Date  . Anxiety   . Arthritis   . Chronic systolic heart failure (HCC)    EF 20-25% ECHO 05/2015  . COPD (chronic obstructive pulmonary disease) ([place])   . Depression   . Diabetes mellitus without complication ([place])    Type II  . Dysrhythmia    pt unsure of name of arrythmia - " my heart rate will drop all of a sudden" -no current treatment - atrial flutter  . Fibrillation, atrial ([place])   . Gallstones   . GERD (gastroesophageal reflux disease)   . Headache(784.0)    migraines  . Hypertension   . Osteomyelitis (HCC)    R ankle  . Rash    arms    Past Surgical History:  Procedure Laterality Date  . ANKLE FRACTURE SURGERY Right 2015  . CARDIAC CATHETERIZATION N/A 06/24/2015   Procedure: Left Heart Cath and Coronary Angiography;  Surgeon: Belva Crome, MD;  Location: [facility];  Service: Cardiovascular;  Laterality: N/A;  . CARPAL TUNNEL RELEASE     bil  . CHOLECYSTECTOMY  11/24/2011   Procedure: LAPAROSCOPIC CHOLECYSTECTOMY WITH INTRAOPERATIVE CHOLANGIOGRAM;  Surgeon: Joyice Faster. Cornett, MD;  Location: [facility];  Service: General;  Laterality: N/A;  Laparoscopic Cholecystectomy with Cholangiogram  . COLONOSCOPY    . ECTOPIC PREGNANCY SURGERY  many yrs ago  . HARDWARE REMOVAL Right 07/15/2015   Procedure: RIGHT ANKLE HARDWARE REMOVAL, PLACEMENT OF STIMULAN ANTIBIOTIC BEADS AND PREVENA WOUND VAC.;  Surgeon: Meredith Pel, MD;  Location: [facility];  Service: Orthopedics;  Laterality: Right;  . TOOTH EXTRACTION N/A 12/03/2016   Procedure: DENTAL EXTRACTIONS with Alveoloplasty;  Surgeon: Diona Browner, DDS;  Location: [facility];  Service: Oral Surgery;  Laterality: N/A;    Current Outpatient Medications  Medication Sig Dispense Refill  . ACCU-CHEK FASTCLIX LANCETS MISC Use as directed to test blood sugar up to four times daily 100 each 12  . ACCU-CHEK SOFTCLIX LANCETS lancets USE AS DIRECTED DAILY UP TO THREE TIMES DAILY 100 each 0  . albuterol (PROVENTIL HFA;VENTOLIN HFA) 108 (90 Base) MCG/ACT inhaler Inhale 2 puffs into the lungs every 4 (four) hours as needed for wheezing or shortness of breath. 1 Inhaler 3  . Blood Glucose Monitoring Suppl (ACCU-CHEK GUIDE) w/Device KIT 1 each by Does not apply route 4 (four) times daily. 1 kit 0  . dicyclomine (BENTYL) 10 MG capsule TAKE ONE CAPSULE BY MOUTH THREE TIMES  DAILY BEFORE MEALS (Patient taking differently: Take 10 mg by mouth 3 (three) times daily before meals. ) 90 capsule 5  . diphenoxylate-atropine (LOMOTIL) 2.5-0.025 MG tablet Take 1 tablet by mouth 4 (four) times daily as needed for diarrhea or loose stools. 60 tablet 1  . doxycycline (VIBRA-TABS) 100 MG tablet Take 1 tablet (100 mg total) by mouth 2 (two) times daily. 20 tablet 0  . esomeprazole (NEXIUM) 20 MG capsule Take 1 capsule (20 mg total) by mouth  daily. 30 capsule 5  . fenofibrate (TRICOR) 48 MG tablet Take 1 tablet (48 mg total) by mouth daily. 90 tablet 3  . FLUoxetine (PROZAC) 40 MG capsule Take 1 capsule (40 mg total) by mouth at bedtime. 30 capsule 5  . glimepiride (AMARYL) 4 MG tablet Take 2 tablets (8 mg total) by mouth daily with breakfast. 60 tablet 6  . glucose blood (ACCU-CHEK GUIDE) test strip Use as instructed to test blood sugar up to four times daily 100 each 12  . hydrOXYzine (ATARAX/VISTARIL) 10 MG tablet Take 1 tablet (10 mg total) by mouth 2 (two) times daily as needed. 60 tablet 5  . Insulin Glargine (LANTUS SOLOSTAR) 100 UNIT/ML Solostar Pen Inject 35 Units into the skin at bedtime. I 3 pen 3  . Insulin Pen Needle 31G X 5 MM MISC 1 each by Does not apply route at bedtime. 30 each 5  . Lancet Devices (ACCU-CHEK SOFTCLIX) lancets Use as instructed daily. 1 each 5  . meclizine (ANTIVERT) 25 MG tablet Take 1 tablet (25 mg total) by mouth 3 (three) times daily as needed for dizziness. 60 tablet 3  . metoprolol succinate (TOPROL-XL) 25 MG 24 hr tablet Take 1 tablet (25 mg total) by mouth daily. 30 tablet 6  . orphenadrine (NORFLEX) 100 MG tablet Take 1 tablet (100 mg total) by mouth 2 (two) times daily. (Patient not taking: Reported on 06/02/2018) 30 tablet 0  . pravastatin (PRAVACHOL) 40 MG tablet Take 1 tablet (40 mg total) by mouth daily. 30 tablet 6  . tiotropium (SPIRIVA) 18 MCG inhalation capsule Place 1 capsule (18 mcg total) into inhaler and inhale daily. 30 capsule 5   No current facility-administered medications for this visit.    Facility-Administered Medications Ordered in Other Visits  Medication Dose Route Frequency Provider Last Rate Last Dose  . regadenoson (LEXISCAN) injection SOLN 0.4 mg  0.4 mg Intravenous Once Dorothy Spark, MD      . technetium tetrofosmin (TC-MYOVIEW) injection 45.6 millicurie  25.6 millicurie Intravenous Once PRN Dorothy Spark, MD        Allergies as of 07/26/2018 - Review  Complete 06/02/2018  Allergen Reaction Noted  . Biaxin [clarithromycin] Anaphylaxis and Swelling 04/08/2013  . Clarithromycin Shortness Of Breath and Swelling 09/05/2011  . Lisinopril Swelling and Cough 09/05/2011  . Sulfa antibiotics Anaphylaxis, Shortness Of Breath, Swelling, and Hypertension 09/05/2011  . Chlorthalidone Nausea And Vomiting and Other (See Comments) 10/08/2011  . Daptomycin Nausea And Vomiting 08/07/2015  . Latex Hives and Rash 09/05/2011  . Amoxicillin-pot clavulanate Diarrhea 09/05/2011  . Aspirin Nausea And Vomiting and Rash 09/05/2011  . Cholestyramine Nausea And Vomiting 04/10/2015  . Dilaudid [hydromorphone hcl] Nausea And Vomiting 09/05/2011  . Lasix [furosemide] Nausea And Vomiting and Other (See Comments) 10/08/2011    Family History  Problem Relation Age of Onset  . Lung cancer Father   . Heart attack Father 39       CABG x4  . Breast cancer Sister  Survivor     Social History   Socioeconomic History  . Marital status: Divorced    Spouse name: Not on file  . Number of children: Not on file  . Years of education: Not on file  . Highest education level: Not on file  Occupational History  . Not on file  Social Needs  . Financial resource strain: Not on file  . Food insecurity:    Worry: Not on file    Inability: Not on file  . Transportation needs:    Medical: Not on file    Non-medical: Not on file  Tobacco Use  . Smoking status: Light Tobacco Smoker    Packs/day: 0.00    Years: 20.00    Pack years: 0.00    Types: Cigarettes, E-cigarettes    Start date: 09/19/2016  . Smokeless tobacco: Never Used  Substance and Sexual Activity  . Alcohol use: No    Alcohol/week: 0.0 standard drinks  . Drug use: No  . Sexual activity: Not Currently    Birth control/protection: Post-menopausal  Lifestyle  . Physical activity:    Days per week: Not on file    Minutes per session: Not on file  . Stress: Not on file  Relationships  . Social  connections:    Talks on phone: Not on file    Gets together: Not on file    Attends religious service: Not on file    Active member of club or organization: Not on file    Attends meetings of clubs or organizations: Not on file    Relationship status: Not on file  . Intimate partner violence:    Fear of current or ex partner: Not on file    Emotionally abused: Not on file    Physically abused: Not on file    Forced sexual activity: Not on file  Other Topics Concern  . Not on file  Social History Narrative  . Not on file    Review of Systems: 12 system ROS is negative except as noted above.  There were no vitals filed for this visit.  Physical Exam: Vital signs were reviewed. General:   Alert, well-nourished, pleasant and cooperative in NAD Head:  Normocephalic and atraumatic. Eyes:  Sclera clear, no icterus.   Conjunctiva pink. Mouth:  No deformity or lesions.   Neck:  Supple; no thyromegaly. Lungs:  Clear throughout to auscultation.   No wheezes.  Heart:  Regular rate and rhythm; no murmurs Abdomen:  Soft, nontender, normal bowel sounds. No rebound or guarding. No hepatosplenomegaly Rectal:  Deferred  Msk:  Symmetrical without gross deformities. Extremities:  No gross deformities or edema. Neurologic:  Alert and  oriented x4;  grossly nonfocal Skin:  No rash or bruise. Psych:  Alert and cooperative. Normal mood and affect.   Jahmani Staup L. Tarri Glenn, MD, MPH [facility] 07/26/2018, 10:25 AM

## 2018-08-18 ENCOUNTER — Ambulatory Visit: Payer: Medicaid Other | Admitting: Gastroenterology

## 2018-08-18 NOTE — Progress Notes (Deleted)
Referring Provider: Charlott Rakes, MD Primary Care Physician:  Charlott Rakes, MD   Reason for Consultation:  IBS diarrhea   IMPRESSION:  ***  PLAN: ***   HPI: [name] is a 58 y.o. female seen in consultation for IBS diarrhea.   Colonoscopy 2014. History of bleeding from internal hemorrhoids.  Quality: Amount: Duration: Timing: Progression: Chronicity: Context: Similar prior episodes: Relieved by: Worsened by: Effective treatments: Ineffective treatments: Associated symptoms: Risks factors:   Past Medical History:  Diagnosis Date  . Anxiety   . Arthritis   . Chronic systolic heart failure (HCC)    EF 20-25% ECHO 05/2015  . COPD (chronic obstructive pulmonary disease) ([place])   . Depression   . Diabetes mellitus without complication ([place])    Type II  . Dysrhythmia    pt unsure of name of arrythmia - " my heart rate will drop all of a sudden" -no current treatment - atrial flutter  . Fibrillation, atrial ([place])   . Gallstones   . GERD (gastroesophageal reflux disease)   . Headache(784.0)    migraines  . Hypertension   . Osteomyelitis (HCC)    R ankle  . Rash    arms    Past Surgical History:  Procedure Laterality Date  . ANKLE FRACTURE SURGERY Right 2015  . CARDIAC CATHETERIZATION N/A 06/24/2015   Procedure: Left Heart Cath and Coronary Angiography;  Surgeon: Belva Crome, MD;  Location: [facility];  Service: Cardiovascular;  Laterality: N/A;  . CARPAL TUNNEL RELEASE     bil  . CHOLECYSTECTOMY  11/24/2011   Procedure: LAPAROSCOPIC CHOLECYSTECTOMY WITH INTRAOPERATIVE CHOLANGIOGRAM;  Surgeon: Joyice Faster. Cornett, MD;  Location: [facility];  Service: General;  Laterality: N/A;  Laparoscopic Cholecystectomy with Cholangiogram  . COLONOSCOPY    . ECTOPIC PREGNANCY SURGERY  many yrs ago  . HARDWARE REMOVAL Right 07/15/2015   Procedure: RIGHT ANKLE HARDWARE REMOVAL, PLACEMENT OF STIMULAN ANTIBIOTIC BEADS AND PREVENA WOUND VAC.;  Surgeon:  Meredith Pel, MD;  Location: [facility];  Service: Orthopedics;  Laterality: Right;  . TOOTH EXTRACTION N/A 12/03/2016   Procedure: DENTAL EXTRACTIONS with Alveoloplasty;  Surgeon: Diona Browner, DDS;  Location: [facility];  Service: Oral Surgery;  Laterality: N/A;    Current Outpatient Medications  Medication Sig Dispense Refill  . ACCU-CHEK FASTCLIX LANCETS MISC Use as directed to test blood sugar up to four times daily 100 each 12  . ACCU-CHEK SOFTCLIX LANCETS lancets USE AS DIRECTED DAILY UP TO THREE TIMES DAILY 100 each 0  . albuterol (PROVENTIL HFA;VENTOLIN HFA) 108 (90 Base) MCG/ACT inhaler Inhale 2 puffs into the lungs every 4 (four) hours as needed for wheezing or shortness of breath. 1 Inhaler 3  . Blood Glucose Monitoring Suppl (ACCU-CHEK GUIDE) w/Device KIT 1 each by Does not apply route 4 (four) times daily. 1 kit 0  . dicyclomine (BENTYL) 10 MG capsule TAKE ONE CAPSULE BY MOUTH THREE TIMES DAILY BEFORE MEALS (Patient taking differently: Take 10 mg by mouth 3 (three) times daily before meals. ) 90 capsule 5  . diphenoxylate-atropine (LOMOTIL) 2.5-0.025 MG tablet Take 1 tablet by mouth 4 (four) times daily as needed for diarrhea or loose stools. 60 tablet 1  . doxycycline (VIBRA-TABS) 100 MG tablet Take 1 tablet (100 mg total) by mouth 2 (two) times daily. 20 tablet 0  . esomeprazole (NEXIUM) 20 MG capsule Take 1 capsule (20 mg total) by mouth daily. 30 capsule 5  . fenofibrate (TRICOR) 48 MG tablet Take 1  tablet (48 mg total) by mouth daily. 90 tablet 3  . FLUoxetine (PROZAC) 40 MG capsule Take 1 capsule (40 mg total) by mouth at bedtime. 30 capsule 5  . glimepiride (AMARYL) 4 MG tablet Take 2 tablets (8 mg total) by mouth daily with breakfast. 60 tablet 6  . glucose blood (ACCU-CHEK GUIDE) test strip Use as instructed to test blood sugar up to four times daily 100 each 12  . hydrOXYzine (ATARAX/VISTARIL) 10 MG tablet Take 1 tablet (10 mg total) by mouth 2 (two) times daily as needed. 60  tablet 5  . Insulin Glargine (LANTUS SOLOSTAR) 100 UNIT/ML Solostar Pen Inject 35 Units into the skin at bedtime. I 3 pen 3  . Insulin Pen Needle 31G X 5 MM MISC 1 each by Does not apply route at bedtime. 30 each 5  . Lancet Devices (ACCU-CHEK SOFTCLIX) lancets Use as instructed daily. 1 each 5  . meclizine (ANTIVERT) 25 MG tablet Take 1 tablet (25 mg total) by mouth 3 (three) times daily as needed for dizziness. 60 tablet 3  . metoprolol succinate (TOPROL-XL) 25 MG 24 hr tablet Take 1 tablet (25 mg total) by mouth daily. 30 tablet 6  . orphenadrine (NORFLEX) 100 MG tablet Take 1 tablet (100 mg total) by mouth 2 (two) times daily. (Patient not taking: Reported on 06/02/2018) 30 tablet 0  . pravastatin (PRAVACHOL) 40 MG tablet Take 1 tablet (40 mg total) by mouth daily. 30 tablet 6  . tiotropium (SPIRIVA) 18 MCG inhalation capsule Place 1 capsule (18 mcg total) into inhaler and inhale daily. 30 capsule 5   No current facility-administered medications for this visit.    Facility-Administered Medications Ordered in Other Visits  Medication Dose Route Frequency Provider Last Rate Last Dose  . regadenoson (LEXISCAN) injection SOLN 0.4 mg  0.4 mg Intravenous Once Dorothy Spark, MD      . technetium tetrofosmin (TC-MYOVIEW) injection 72.8 millicurie  20.6 millicurie Intravenous Once PRN Dorothy Spark, MD        Allergies as of 08/18/2018 - Review Complete 06/02/2018  Allergen Reaction Noted  . Biaxin [clarithromycin] Anaphylaxis and Swelling 04/08/2013  . Clarithromycin Shortness Of Breath and Swelling 09/05/2011  . Lisinopril Swelling and Cough 09/05/2011  . Sulfa antibiotics Anaphylaxis, Shortness Of Breath, Swelling, and Hypertension 09/05/2011  . Chlorthalidone Nausea And Vomiting and Other (See Comments) 10/08/2011  . Daptomycin Nausea And Vomiting 08/07/2015  . Latex Hives and Rash 09/05/2011  . Amoxicillin-pot clavulanate Diarrhea 09/05/2011  . Aspirin Nausea And Vomiting and  Rash 09/05/2011  . Cholestyramine Nausea And Vomiting 04/10/2015  . Dilaudid [hydromorphone hcl] Nausea And Vomiting 09/05/2011  . Lasix [furosemide] Nausea And Vomiting and Other (See Comments) 10/08/2011    Family History  Problem Relation Age of Onset  . Lung cancer Father   . Heart attack Father 25       CABG x4  . Breast cancer Sister        Survivor     Social History   Socioeconomic History  . Marital status: Divorced    Spouse name: Not on file  . Number of children: Not on file  . Years of education: Not on file  . Highest education level: Not on file  Occupational History  . Not on file  Social Needs  . Financial resource strain: Not on file  . Food insecurity:    Worry: Not on file    Inability: Not on file  . Transportation needs:  Medical: Not on file    Non-medical: Not on file  Tobacco Use  . Smoking status: Light Tobacco Smoker    Packs/day: 0.00    Years: 20.00    Pack years: 0.00    Types: Cigarettes, E-cigarettes    Start date: 09/19/2016  . Smokeless tobacco: Never Used  Substance and Sexual Activity  . Alcohol use: No    Alcohol/week: 0.0 standard drinks  . Drug use: No  . Sexual activity: Not Currently    Birth control/protection: Post-menopausal  Lifestyle  . Physical activity:    Days per week: Not on file    Minutes per session: Not on file  . Stress: Not on file  Relationships  . Social connections:    Talks on phone: Not on file    Gets together: Not on file    Attends religious service: Not on file    Active member of club or organization: Not on file    Attends meetings of clubs or organizations: Not on file    Relationship status: Not on file  . Intimate partner violence:    Fear of current or ex partner: Not on file    Emotionally abused: Not on file    Physically abused: Not on file    Forced sexual activity: Not on file  Other Topics Concern  . Not on file  Social History Narrative  . Not on file    Review of  Systems: 12 system ROS is negative except as noted above.  There were no vitals filed for this visit.  Physical Exam: Vital signs were reviewed. General:   Alert, well-nourished, pleasant and cooperative in NAD Head:  Normocephalic and atraumatic. Eyes:  Sclera clear, no icterus.   Conjunctiva pink. Mouth:  No deformity or lesions.   Neck:  Supple; no thyromegaly. Lungs:  Clear throughout to auscultation.   No wheezes.  Heart:  Regular rate and rhythm; no murmurs Abdomen:  Soft, nontender, normal bowel sounds. No rebound or guarding. No hepatosplenomegaly Rectal:  Deferred  Msk:  Symmetrical without gross deformities. Extremities:  No gross deformities or edema. Neurologic:  Alert and  oriented x4;  grossly nonfocal Skin:  No rash or bruise. Psych:  Alert and cooperative. Normal mood and affect.   Tavon Corriher L. Tarri Glenn, MD, MPH [facility] 08/18/2018, 10:37 AM

## 2018-08-22 ENCOUNTER — Encounter: Payer: Self-pay | Admitting: Family Medicine

## 2018-08-28 ENCOUNTER — Other Ambulatory Visit: Payer: Self-pay | Admitting: Family Medicine

## 2018-08-28 DIAGNOSIS — K580 Irritable bowel syndrome with diarrhea: Secondary | ICD-10-CM

## 2018-08-29 NOTE — Telephone Encounter (Signed)
Pt last seen: 06/02/18 Next appt: 09/05/18 Last RX written on: 07/03/18 Date of original fill: 07/03/18 Date of refill(s): 07/26/18  No other controlled substances were filled during this time, please refill if appropriate.

## 2018-09-05 ENCOUNTER — Ambulatory Visit: Payer: Medicaid Other | Admitting: Family Medicine

## 2018-09-08 ENCOUNTER — Other Ambulatory Visit: Payer: Self-pay | Admitting: Family Medicine

## 2018-09-08 DIAGNOSIS — H8113 Benign paroxysmal vertigo, bilateral: Secondary | ICD-10-CM

## 2018-09-11 ENCOUNTER — Other Ambulatory Visit: Payer: Self-pay | Admitting: Family Medicine

## 2018-09-11 DIAGNOSIS — K580 Irritable bowel syndrome with diarrhea: Secondary | ICD-10-CM

## 2018-09-15 IMAGING — CR DG CHEST 2V
2 series · 2 of 2 positions shown · non-contrast
Comparison: Radiographs 07/20/2017

CLINICAL DATA: Left-sided chest pain.

EXAM:
CHEST  2 VIEW

[chest pa]
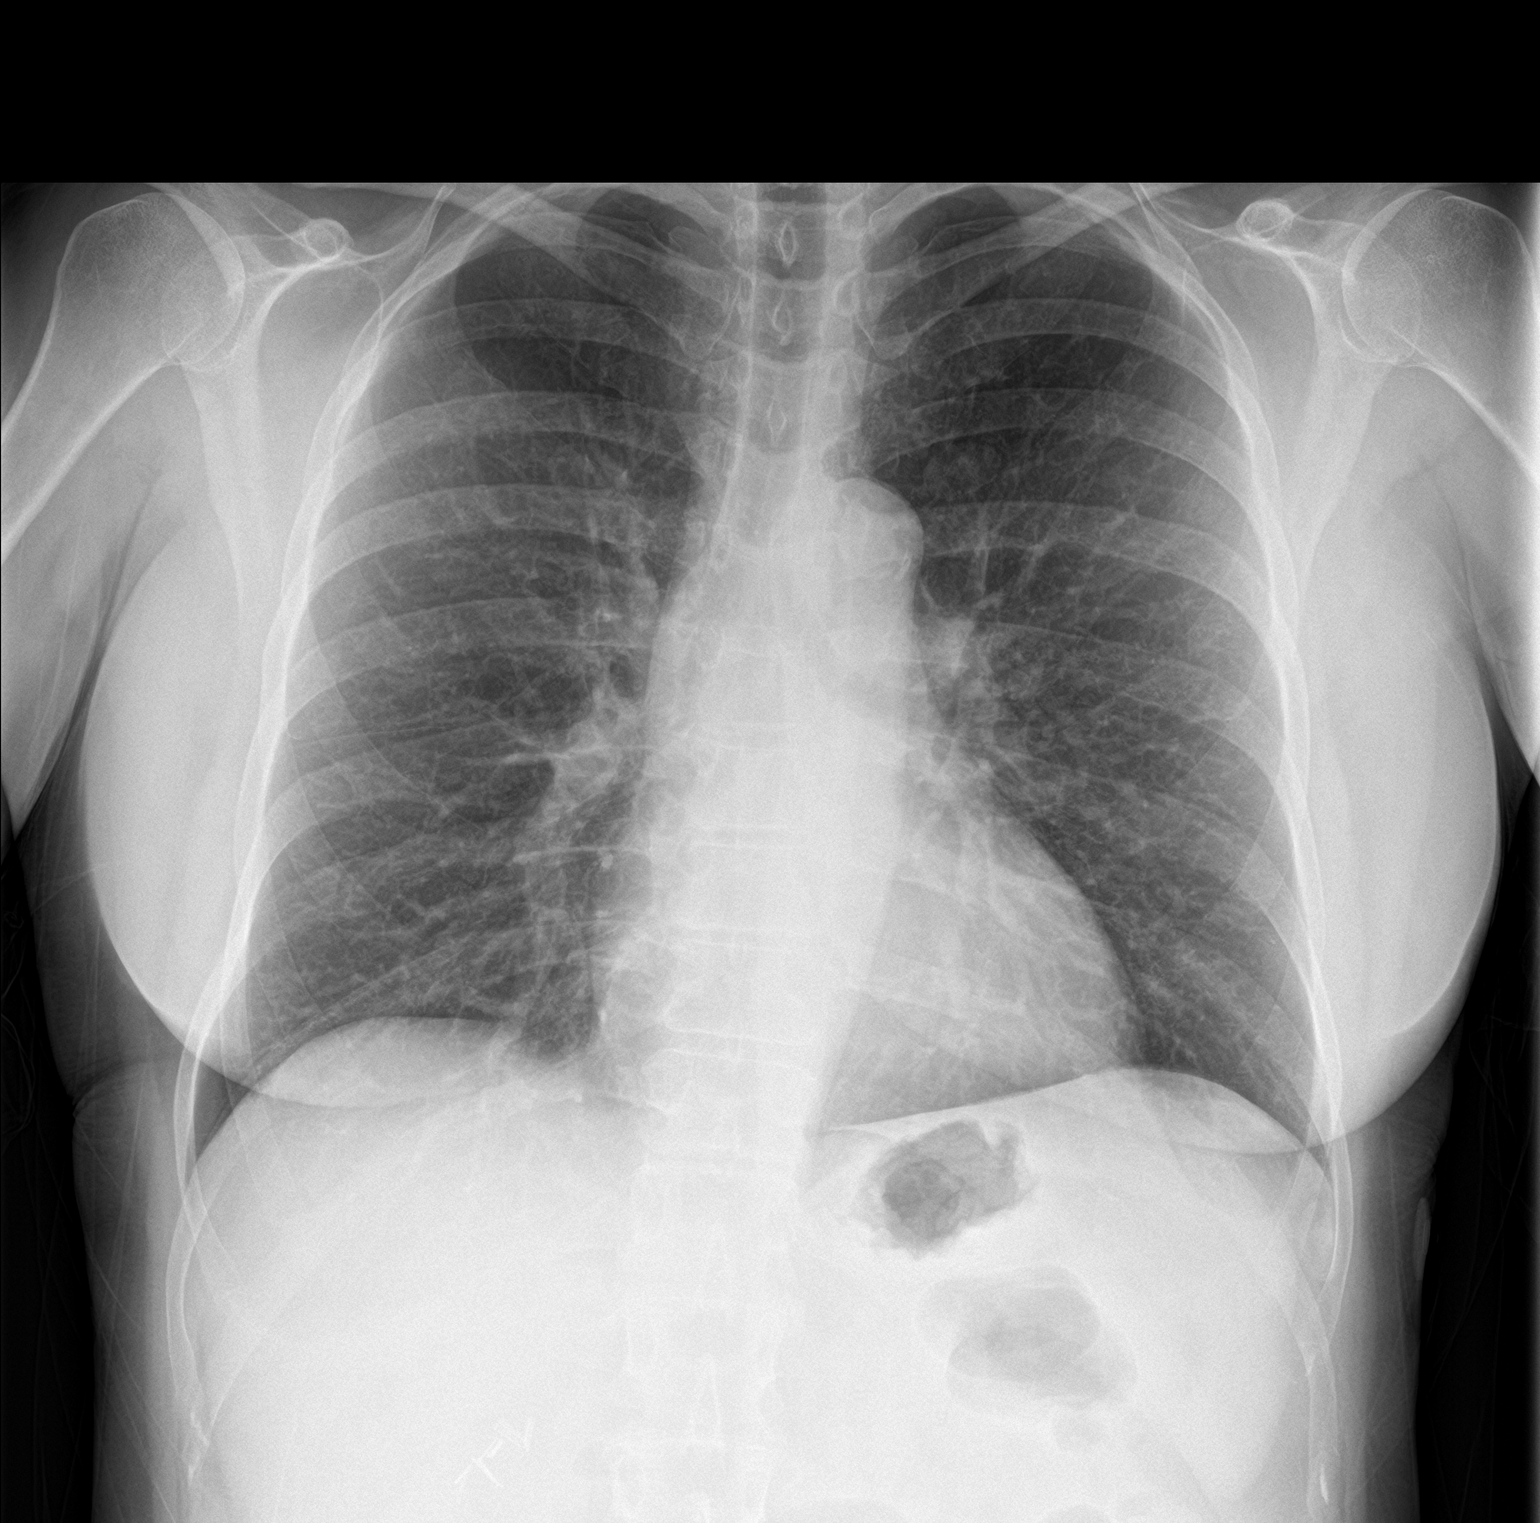

[chest lat]
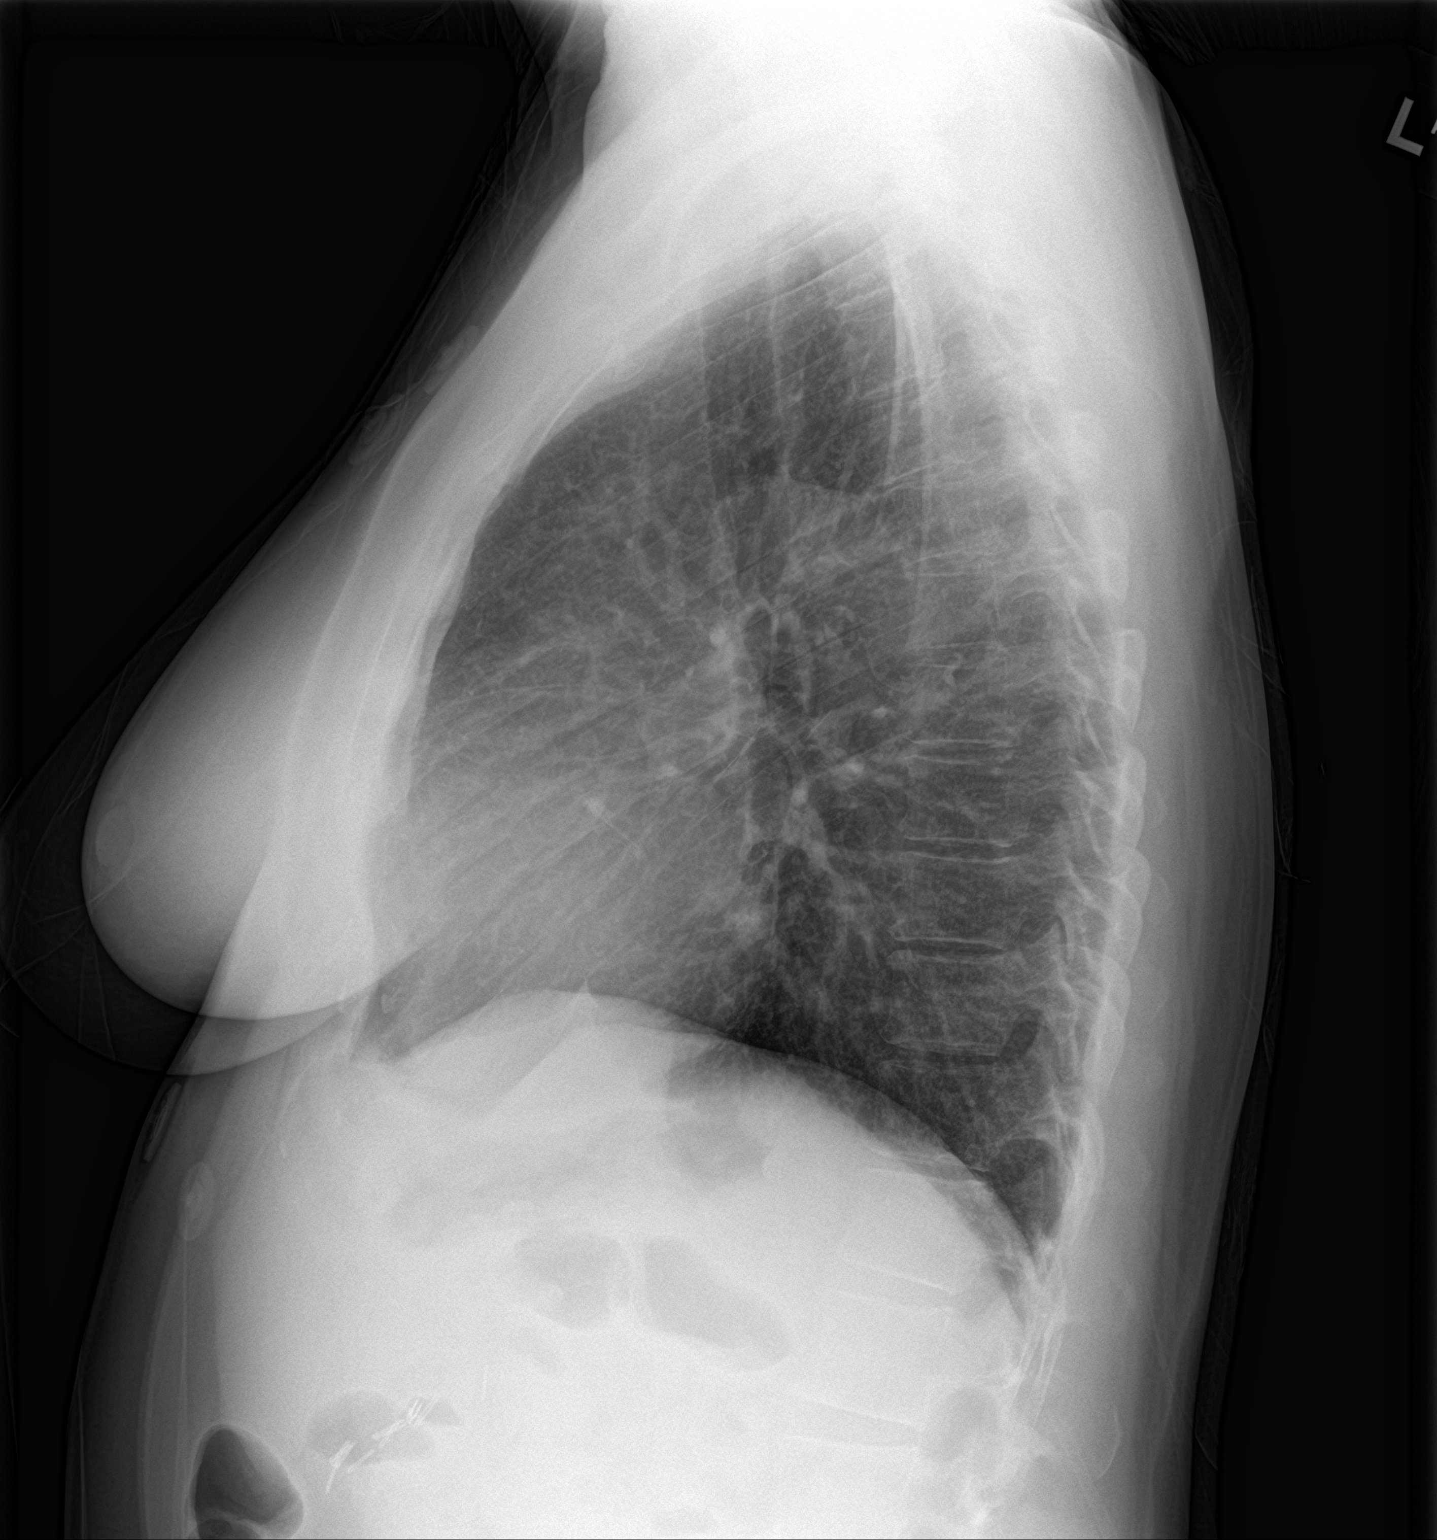

[2 of 2 positions shown; findings below may reference images not displayed]

FINDINGS: The cardiomediastinal contours are normal. The lungs are clear.
Pulmonary vasculature is normal. No consolidation, pleural effusion,
or pneumothorax. No acute osseous abnormalities are seen.
IMPRESSION: No acute abnormality.

## 2018-09-22 ENCOUNTER — Ambulatory Visit (INDEPENDENT_AMBULATORY_CARE_PROVIDER_SITE_OTHER): Payer: Medicaid Other | Admitting: Orthopedic Surgery

## 2018-09-22 ENCOUNTER — Encounter (INDEPENDENT_AMBULATORY_CARE_PROVIDER_SITE_OTHER): Payer: Self-pay | Admitting: Orthopedic Surgery

## 2018-09-22 ENCOUNTER — Other Ambulatory Visit: Payer: Self-pay

## 2018-09-22 ENCOUNTER — Ambulatory Visit (INDEPENDENT_AMBULATORY_CARE_PROVIDER_SITE_OTHER): Payer: Medicaid Other

## 2018-09-22 DIAGNOSIS — M25511 Pain in right shoulder: Secondary | ICD-10-CM

## 2018-09-22 NOTE — Progress Notes (Signed)
Office Visit Note   Patient: [name]           Date of Birth: 18-May-1961           MRN: [id number] Visit Date: 09/22/2018 Requested by: Ebony Register, MD [address] [name], [name] [address] PCP: Ebony Register, MD  Subjective: Chief Complaint  Patient presents with  . Right Shoulder - Pain    HPI: [name] is a 58 year old female with right shoulder pain of 1 weeks duration.  Denies any history of injury.  Localizes the pain primarily to the anterior aspect of the shoulder.  Denies any neck pain or radicular type symptoms.  Does report some degree of decreased range of motion.  She does have a 26-year-old grandchild at home.              ROS: All systems reviewed are negative as they relate to the chief complaint within the history of present illness.  Patient denies  fevers or chills.   Assessment & Plan: Visit Diagnoses:  1. Right shoulder pain, unspecified chronicity     Plan: Impression is right shoulder pain possible early bursitis does not look like frozen shoulder or rotator cuff pathology.  Plan is right shoulder continued conservative treatment.  I think she should try ibuprofen for 3 weeks and then if that does not help come back in for repeat evaluation and likely subacromial injection.  Follow-Up Instructions: No follow-ups on file.   Orders:  Orders Placed This Encounter  Procedures  . XR Shoulder Right   No orders of the defined types were placed in this encounter.     Procedures: No procedures performed   Clinical Data: No additional findings.  Objective: Vital Signs: LMP 07/24/2011   Physical Exam:   Constitutional: Patient appears well-developed HEENT:  Head: Normocephalic Eyes:EOM are normal Neck: Normal range of motion Cardiovascular: Normal rate Pulmonary/chest: Effort normal Neurologic: Patient is alert Skin: Skin is warm Psychiatric: Patient has normal mood and affect    Ortho Exam: Ortho exam demonstrates full  active and passive range of motion of her neck.  She has 5 out of 5 grip EPL FPL interosseous wrist flexion extension bicep triceps and deltoid strength.  She has palpable radial pulses bilaterally.  No masses lymphadenopathy or skin changes noted in that shoulder girdle region.  Rotator cuff strength is good on the right-hand side.  No restriction of passive external rotation of 15 degrees of abduction.  Specialty Comments:  No specialty comments available.  Imaging: Xr Shoulder Right  Result Date: 09/22/2018 AP lateral outlet right shoulder reviewed.  Shoulder is located.  No fracture dislocation is present.  No AC joint arthritis or glenohumeral arthritis is present.  Visualized lung fields clear.    PMFS History: Patient Active Problem List   Diagnosis Date Noted  . 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 Primary osteoarthritis of both hands 10/06/2016  . History of gastroesophageal reflux (GERD) 10/06/2016  . History of COPD 10/06/2016  . History of anxiety 10/06/2016  . History of CHF (congestive heart failure) 10/06/2016  . History of hypertension 10/06/2016  . History of cardiac dysrhythmia 10/06/2016  . Primary osteoarthritis of both feet 10/06/2016  . Primary osteoarthritis of both knees 10/06/2016  . History of infection/ ankle post operative  10/06/2016  . Rheumatoid factor positive 10/06/2016  . De Quervain's disease (radial styloid tenosynovitis) 06/09/2016  . Tobacco abuse 05/23/2016  . Hyperglycemia without ketosis   . Bleeding hemorrhoids 11/03/2015  . Acute  blood loss anemia 11/03/2015  . Hematochezia 11/01/2015  . Back pain with sciatica 09/08/2015  . Infection of bone of ankle ([name]) 07/15/2015  . Chronic systolic CHF (congestive heart failure) ([name]) 06/24/2015  . Chest pain 06/03/2015  . Hypertension   . Anxiety   . Gallstones   . GERD (gastroesophageal reflux disease)   . COPD (chronic obstructive pulmonary disease) ([name])   . Dysrhythmia   . Rash   . Depression    . Diabetes mellitus without complication ([name])   . Post-operative state 12/10/2011   Past Medical History:  Diagnosis Date  . Anxiety   . Arthritis   . Chronic systolic heart failure ([name])    EF 20-25% ECHO 05/2015  . COPD (chronic obstructive pulmonary disease) ([name])   . Depression   . Diabetes mellitus without complication ([name])    Type II  . Dysrhythmia    pt unsure of name of arrythmia - " my heart rate will drop all of a sudden" -no current treatment - atrial flutter  . Fibrillation, atrial ([name])   . Gallstones   . GERD (gastroesophageal reflux disease)   . Headache(784.0)    migraines  . Hypertension   . Osteomyelitis ([name])    R ankle  . Rash    arms    Family History  Problem Relation Age of Onset  . Lung cancer Father   . Heart attack Father 62       CABG x4  . Breast cancer Sister        Survivor     Past Surgical History:  Procedure Laterality Date  . ANKLE FRACTURE SURGERY Right 2015  . CARDIAC CATHETERIZATION N/A 06/24/2015   Procedure: Left Heart Cath and Coronary Angiography;  Surgeon: [name], MD;  Location: [facility];  Service: Cardiovascular;  Laterality: N/A;  . CARPAL TUNNEL RELEASE     bil  . CHOLECYSTECTOMY  11/24/2011   Procedure: LAPAROSCOPIC CHOLECYSTECTOMY WITH INTRAOPERATIVE CHOLANGIOGRAM;  Surgeon: Clovis Pu. Cornett, MD;  Location: [facility];  Service: General;  Laterality: N/A;  Laparoscopic Cholecystectomy with Cholangiogram  . COLONOSCOPY    . ECTOPIC PREGNANCY SURGERY  many yrs ago  . HARDWARE REMOVAL Right 07/15/2015   Procedure: RIGHT ANKLE HARDWARE REMOVAL, PLACEMENT OF STIMULAN ANTIBIOTIC BEADS AND PREVENA WOUND VAC.;  Surgeon: [name], MD;  Location: [facility];  Service: Orthopedics;  Laterality: Right;  . TOOTH EXTRACTION N/A 12/03/2016   Procedure: DENTAL EXTRACTIONS with Alveoloplasty;  Surgeon: Ocie Doyne, DDS;  Location: [facility];  Service: Oral Surgery;  Laterality: N/A;   Social History   Occupational  History  . Not on file  Tobacco Use  . Smoking status: Light Tobacco Smoker    Packs/day: 0.00    Years: 20.00    Pack years: 0.00    Types: Cigarettes, E-cigarettes    Start date: 09/19/2016  . Smokeless tobacco: Never Used  Substance and Sexual Activity  . Alcohol use: No    Alcohol/week: 0.0 standard drinks  . Drug use: No  . Sexual activity: Not Currently    Birth control/protection: Post-menopausal

## 2018-10-02 ENCOUNTER — Other Ambulatory Visit: Payer: Self-pay

## 2018-10-02 ENCOUNTER — Encounter: Payer: Self-pay | Admitting: Family Medicine

## 2018-10-02 ENCOUNTER — Ambulatory Visit: Payer: Medicaid Other | Attending: Family Medicine | Admitting: Family Medicine

## 2018-10-02 VITALS — BP 142/70 | HR 64 | Temp 98.2°F | Ht 67.0 in | Wt 166.0 lb

## 2018-10-02 DIAGNOSIS — Z794 Long term (current) use of insulin: Secondary | ICD-10-CM | POA: Diagnosis not present

## 2018-10-02 DIAGNOSIS — K580 Irritable bowel syndrome with diarrhea: Secondary | ICD-10-CM | POA: Diagnosis not present

## 2018-10-02 DIAGNOSIS — M546 Pain in thoracic spine: Secondary | ICD-10-CM

## 2018-10-02 DIAGNOSIS — E1165 Type 2 diabetes mellitus with hyperglycemia: Secondary | ICD-10-CM

## 2018-10-02 DIAGNOSIS — Z8719 Personal history of other diseases of the digestive system: Secondary | ICD-10-CM

## 2018-10-02 DIAGNOSIS — I10 Essential (primary) hypertension: Secondary | ICD-10-CM

## 2018-10-02 DIAGNOSIS — J410 Simple chronic bronchitis: Secondary | ICD-10-CM

## 2018-10-02 DIAGNOSIS — R21 Rash and other nonspecific skin eruption: Secondary | ICD-10-CM

## 2018-10-02 DIAGNOSIS — F419 Anxiety disorder, unspecified: Secondary | ICD-10-CM

## 2018-10-02 DIAGNOSIS — H8113 Benign paroxysmal vertigo, bilateral: Secondary | ICD-10-CM

## 2018-10-02 LAB — GLUCOSE, POCT (MANUAL RESULT ENTRY)
POC Glucose: 396 mg/dl — AB (ref 70–99)
POC Glucose: 315 mg/dl — AB (ref 70–99)

## 2018-10-02 LAB — POCT GLYCOSYLATED HEMOGLOBIN (HGB A1C): HbA1c, POC (controlled diabetic range): 10.6 % — AB (ref 0.0–7.0)

## 2018-10-02 MED ORDER — INSULIN ASPART 100 UNIT/ML ~~LOC~~ SOLN
10.0000 [IU] | Freq: Once | SUBCUTANEOUS | Status: AC
  Administered 2018-10-02: 10 [IU] via SUBCUTANEOUS

## 2018-10-02 MED ORDER — GLIMEPIRIDE 4 MG PO TABS: 8.0000 mg | ORAL_TABLET | Freq: Every day | ORAL | 1 refills | Status: DC

## 2018-10-02 MED ORDER — INSULIN GLARGINE 100 UNIT/ML SOLOSTAR PEN: 40.0000 [IU] | PEN_INJECTOR | Freq: Every day | SUBCUTANEOUS | 3 refills | Status: DC

## 2018-10-02 MED ORDER — TIOTROPIUM BROMIDE MONOHYDRATE 18 MCG IN CAPS: 18.0000 ug | ORAL_CAPSULE | Freq: Every day | RESPIRATORY_TRACT | 1 refills | Status: DC

## 2018-10-02 MED ORDER — FLUOXETINE HCL 40 MG PO CAPS: 40.0000 mg | ORAL_CAPSULE | Freq: Every day | ORAL | 1 refills | Status: DC

## 2018-10-02 MED ORDER — TIZANIDINE HCL 4 MG PO TABS: 4.0000 mg | ORAL_TABLET | Freq: Three times a day (TID) | ORAL | 1 refills | Status: DC | PRN

## 2018-10-02 MED ORDER — ESOMEPRAZOLE MAGNESIUM 20 MG PO CPDR: 20.0000 mg | DELAYED_RELEASE_CAPSULE | Freq: Every day | ORAL | 1 refills | Status: DC

## 2018-10-02 MED ORDER — DICYCLOMINE HCL 10 MG PO CAPS: 10.0000 mg | ORAL_CAPSULE | Freq: Three times a day (TID) | ORAL | 1 refills | Status: DC

## 2018-10-02 MED ORDER — TRIAMCINOLONE ACETONIDE 0.1 % EX CREA: 1.0000 "application " | TOPICAL_CREAM | Freq: Two times a day (BID) | CUTANEOUS | 0 refills | Status: DC

## 2018-10-02 MED ORDER — PRAVASTATIN SODIUM 40 MG PO TABS: 40.0000 mg | ORAL_TABLET | Freq: Every day | ORAL | 1 refills | Status: DC

## 2018-10-02 MED ORDER — FENOFIBRATE 48 MG PO TABS: 48.0000 mg | ORAL_TABLET | Freq: Every day | ORAL | 1 refills | Status: DC

## 2018-10-02 MED ORDER — MECLIZINE HCL 25 MG PO TABS: 25.0000 mg | ORAL_TABLET | Freq: Three times a day (TID) | ORAL | 1 refills | Status: DC | PRN

## 2018-10-02 MED ORDER — DIPHENOXYLATE-ATROPINE 2.5-0.025 MG PO TABS: 1.0000 | ORAL_TABLET | Freq: Four times a day (QID) | ORAL | 1 refills | Status: DC | PRN

## 2018-10-02 MED ORDER — HYDROXYZINE HCL 10 MG PO TABS: 10.0000 mg | ORAL_TABLET | Freq: Two times a day (BID) | ORAL | 1 refills | Status: DC | PRN

## 2018-10-02 MED ORDER — METOPROLOL SUCCINATE ER 25 MG PO TB24: 25.0000 mg | ORAL_TABLET | Freq: Every day | ORAL | 1 refills | Status: DC

## 2018-10-02 NOTE — Progress Notes (Signed)
Subjective:  Patient ID: [name], female    DOB: [birth year]  Age: 58 y.o. MRN: [id number]  CC: Diabetes   HPI [name] is a 58 year old female with a history of type 2 diabetes mellitus (A1c 10.6), hypertension, CHF (EF 55-60%  2-D echo of 09/2016 which has improved from 25%-30% previously), anxiety, GERD who presents today for follow-up visit. She complains of a pruritic rash on her upper extremities which she states usually comes up in the summer and resolves when she receives a cortisone injection. Rash is not present in other body parts and she denies production of new soaps or creams.  With regards to her diabetes mellitus her A1c is 10.6 which has improved from 12.6 previously and she endorses adherence to her 35 units of Lantus except last night when she forgot to take it as she was caring for a sick family member who is terminally ill.  Her blood sugar is 396 in the clinic today and she has received 10 units of NovoLog.  Last meal was 3 hours ago. She denies shortness of breath, chest pains, pedal edema. Her thoracolumbar region also hurts and sometimes radiates to her left buttock.  This has been present for the last 3 to 4 weeks and she thinks it radiates also around her abdomen with resulting hardness in her stomach but she states at this time the hardness has resolved.  Denies recent back trauma, heavy lifting.  Past Medical History:  Diagnosis Date  . Anxiety   . Arthritis   . Chronic systolic heart failure (HCC)    EF 20-25% ECHO 05/2015  . COPD (chronic obstructive pulmonary disease) ([place])   . Depression   . Diabetes mellitus without complication ([place])    Type II  . Dysrhythmia    pt unsure of name of arrythmia - " my heart rate will drop all of a sudden" -no current treatment - atrial flutter  . Fibrillation, atrial ([place])   . Gallstones   . GERD (gastroesophageal reflux disease)   . Headache(784.0)    migraines  .  Hypertension   . Osteomyelitis (HCC)    R ankle  . Rash    arms    Past Surgical History:  Procedure Laterality Date  . ANKLE FRACTURE SURGERY Right 2015  . CARDIAC CATHETERIZATION N/A 06/24/2015   Procedure: Left Heart Cath and Coronary Angiography;  Surgeon: Belva Crome, MD;  Location: [facility];  Service: Cardiovascular;  Laterality: N/A;  . CARPAL TUNNEL RELEASE     bil  . CHOLECYSTECTOMY  11/24/2011   Procedure: LAPAROSCOPIC CHOLECYSTECTOMY WITH INTRAOPERATIVE CHOLANGIOGRAM;  Surgeon: Joyice Faster. Cornett, MD;  Location: [facility];  Service: General;  Laterality: N/A;  Laparoscopic Cholecystectomy with Cholangiogram  . COLONOSCOPY    . ECTOPIC PREGNANCY SURGERY  many yrs ago  . HARDWARE REMOVAL Right 07/15/2015   Procedure: RIGHT ANKLE HARDWARE REMOVAL, PLACEMENT OF STIMULAN ANTIBIOTIC BEADS AND PREVENA WOUND VAC.;  Surgeon: Meredith Pel, MD;  Location: [facility];  Service: Orthopedics;  Laterality: Right;  . TOOTH EXTRACTION N/A 12/03/2016   Procedure: DENTAL EXTRACTIONS with Alveoloplasty;  Surgeon: Diona Browner, DDS;  Location: [facility];  Service: Oral Surgery;  Laterality: N/A;    Family History  Problem Relation Age of Onset  . Lung cancer Father   . Heart attack Father 55       CABG x4  . Breast cancer Sister        Survivor  Allergies  Allergen Reactions  . Biaxin [Clarithromycin] Anaphylaxis and Swelling  . Clarithromycin Shortness Of Breath and Swelling  . Lisinopril Swelling and Cough    Lip edema  . Sulfa Antibiotics Anaphylaxis, Shortness Of Breath, Swelling and Hypertension  . Chlorthalidone Nausea And Vomiting and Other (See Comments)    Clammy, Tachycardia, Headache   . Daptomycin Nausea And Vomiting  . Latex Hives and Rash  . Amoxicillin-Pot Clavulanate Diarrhea    Has patient had a PCN reaction causing immediate rash, facial/tongue/throat swelling, SOB or lightheadedness with hypotension:No Has patient had a PCN reaction causing severe rash  involving mucus membranes or skin necrosis:No Has patient had a PCN reaction that required hospitalization:No Has patient had a PCN reaction occurring within THE LAST 10 YEARS.  #  #  #  YES  #  #  #  If all of the above answers are "NO", then may proceed with Cephalosporin use.   . Aspirin Nausea And Vomiting and Rash    On '325mg'$  dosage  . Cholestyramine Nausea And Vomiting  . Dilaudid [Hydromorphone Hcl] Nausea And Vomiting  . Lasix [Furosemide] Nausea And Vomiting and Other (See Comments)    Headache     Outpatient Medications Prior to Visit  Medication Sig Dispense Refill  . ACCU-CHEK FASTCLIX LANCETS MISC Use as directed to test blood sugar up to four times daily 100 each 12  . ACCU-CHEK SOFTCLIX LANCETS lancets USE AS DIRECTED DAILY UP TO THREE TIMES DAILY 100 each 0  . albuterol (PROVENTIL HFA;VENTOLIN HFA) 108 (90 Base) MCG/ACT inhaler Inhale 2 puffs into the lungs every 4 (four) hours as needed for wheezing or shortness of breath. 1 Inhaler 3  . Blood Glucose Monitoring Suppl (ACCU-CHEK GUIDE) w/Device KIT 1 each by Does not apply route 4 (four) times daily. 1 kit 0  . glucose blood (ACCU-CHEK GUIDE) test strip Use as instructed to test blood sugar up to four times daily 100 each 12  . Insulin Pen Needle 31G X 5 MM MISC 1 each by Does not apply route at bedtime. 30 each 5  . Lancet Devices (ACCU-CHEK SOFTCLIX) lancets Use as instructed daily. 1 each 5  . orphenadrine (NORFLEX) 100 MG tablet Take 1 tablet (100 mg total) by mouth 2 (two) times daily. 30 tablet 0  . dicyclomine (BENTYL) 10 MG capsule TAKE ONE CAPSULE BY MOUTH THREE TIMES DAILY BEFORE MEALS (Patient taking differently: Take 10 mg by mouth 3 (three) times daily before meals. ) 90 capsule 5  . diphenoxylate-atropine (LOMOTIL) 2.5-0.025 MG tablet Take 1 tablet by mouth 4 (four) times daily as needed for diarrhea or loose stools. 60 tablet 1  . doxycycline (VIBRA-TABS) 100 MG tablet Take 1 tablet (100 mg total) by mouth 2  (two) times daily. 20 tablet 0  . esomeprazole (NEXIUM) 20 MG capsule Take 1 capsule (20 mg total) by mouth daily. 30 capsule 5  . fenofibrate (TRICOR) 48 MG tablet Take 1 tablet (48 mg total) by mouth daily. 90 tablet 3  . FLUoxetine (PROZAC) 40 MG capsule Take 1 capsule (40 mg total) by mouth at bedtime. 30 capsule 5  . glimepiride (AMARYL) 4 MG tablet Take 2 tablets (8 mg total) by mouth daily with breakfast. 60 tablet 6  . hydrOXYzine (ATARAX/VISTARIL) 10 MG tablet Take 1 tablet (10 mg total) by mouth 2 (two) times daily as needed. 60 tablet 5  . Insulin Glargine (LANTUS SOLOSTAR) 100 UNIT/ML Solostar Pen Inject 35 Units into the skin at bedtime. I  3 pen 3  . meclizine (ANTIVERT) 25 MG tablet Take 1 tablet (25 mg total) by mouth 3 (three) times daily as needed for dizziness. 60 tablet 3  . metoprolol succinate (TOPROL-XL) 25 MG 24 hr tablet Take 1 tablet (25 mg total) by mouth daily. 30 tablet 6  . pravastatin (PRAVACHOL) 40 MG tablet Take 1 tablet (40 mg total) by mouth daily. 30 tablet 6  . tiotropium (SPIRIVA) 18 MCG inhalation capsule Place 1 capsule (18 mcg total) into inhaler and inhale daily. 30 capsule 5   Facility-Administered Medications Prior to Visit  Medication Dose Route Frequency Provider Last Rate Last Dose  . regadenoson (LEXISCAN) injection SOLN 0.4 mg  0.4 mg Intravenous Once Dorothy Spark, MD      . technetium tetrofosmin (TC-MYOVIEW) injection 38.4 millicurie  53.6 millicurie Intravenous Once PRN Dorothy Spark, MD         ROS Review of Systems  Constitutional: Negative for activity change, appetite change and fatigue.  HENT: Negative for congestion, sinus pressure and sore throat.   Eyes: Negative for visual disturbance.  Respiratory: Negative for cough, chest tightness, shortness of breath and wheezing.   Cardiovascular: Negative for chest pain and palpitations.  Gastrointestinal: Negative for abdominal distention, abdominal pain and constipation.   Endocrine: Negative for polydipsia.  Genitourinary: Negative for dysuria and frequency.  Musculoskeletal: Positive for back pain. Negative for arthralgias.  Skin: Positive for rash.  Neurological: Negative for tremors, light-headedness and numbness.  Hematological: Does not bruise/bleed easily.  Psychiatric/Behavioral: Negative for agitation and behavioral problems.    Objective:  BP (!) 142/70   Pulse 64   Temp 98.2 F (36.8 C) (Oral)   Ht '5\' 7"'$  (1.702 m)   Wt 166 lb (75.3 kg)   LMP 07/24/2011   SpO2 97%   BMI 26.00 kg/m   BP/Weight 10/02/2018 06/02/2018 46/01/320  Systolic BP 224 825 003  Diastolic BP 70 78 90  Wt. (Lbs) 166 161.6 159  BMI 26 25.31 24.9      Physical Exam Constitutional:      Appearance: She is well-developed.  Cardiovascular:     Rate and Rhythm: Normal rate.     Heart sounds: Normal heart sounds. No murmur.  Pulmonary:     Effort: Pulmonary effort is normal.     Breath sounds: Normal breath sounds. No wheezing or rales.  Chest:     Chest wall: No tenderness.  Abdominal:     General: Bowel sounds are normal. There is no distension.     Palpations: Abdomen is soft. There is no mass.     Tenderness: There is no abdominal tenderness.  Musculoskeletal: Normal range of motion.     Comments: No tenderness on palpation of thoracolumbar region Negative straight leg raise bilaterally  Neurological:     Mental Status: She is alert and oriented to person, place, and time.  Psychiatric:        Mood and Affect: Mood normal.        Behavior: Behavior normal.     CMP Latest Ref Rng & Units 06/23/2018 04/20/2018 04/17/2018  Glucose 65 - 99 mg/dL - 285(H) 519(HH)  BUN 6 - 24 mg/dL - 8 8  Creatinine 0.57 - 1.00 mg/dL - 0.60 0.54  Sodium 134 - 144 mmol/L - 140 130(L)  Potassium 3.5 - 5.2 mmol/L - 4.0 3.8  Chloride 96 - 106 mmol/L - 102 95(L)  CO2 20 - 29 mmol/L - 19(L) 23  Calcium 8.7 - 10.2 mg/dL -  9.3 9.0  Total Protein 6.0 - 8.5 g/dL 6.6 7.2 7.3   Total Bilirubin 0.0 - 1.2 mg/dL 0.3 0.4 0.5  Alkaline Phos 39 - 117 IU/L 102 138(H) 131(H)  AST 0 - 40 IU/L 25 34 32  ALT 0 - 32 IU/L 29 37(H) 37    Lipid Panel     Component Value Date/Time   CHOL 150 06/23/2018 1228   TRIG 178 (H) 06/23/2018 1228   HDL 31 (L) 06/23/2018 1228   CHOLHDL 4.8 (H) 06/23/2018 1228   CHOLHDL 5.1 (H) 07/05/2016 0947   VLDL UNABLE TO CALCULATE IF TRIGLYCERIDE OVER 400 mg/dL 05/24/2016 0415   LDLCALC 83 06/23/2018 1228    CBC    Component Value Date/Time   WBC 9.8 04/17/2018 2043   RBC 5.47 (H) 04/17/2018 2043   HGB 15.8 (H) 04/17/2018 2043   HCT 46.4 (H) 04/17/2018 2043   PLT 152 04/17/2018 2043   MCV 84.8 04/17/2018 2043   MCH 28.9 04/17/2018 2043   MCHC 34.1 04/17/2018 2043   RDW 12.9 04/17/2018 2043   LYMPHSABS 2.6 04/01/2016 2144   MONOABS 0.4 04/01/2016 2144   EOSABS 0.3 04/01/2016 2144   BASOSABS 0.0 04/01/2016 2144    Lab Results  Component Value Date   HGBA1C 10.6 (A) 10/02/2018    Assessment & Plan:   1. Irritable bowel syndrome with diarrhea Improved - dicyclomine (BENTYL) 10 MG capsule; Take 1 capsule (10 mg total) by mouth 3 (three) times daily before meals.  Dispense: 270 capsule; Refill: 1 - diphenoxylate-atropine (LOMOTIL) 2.5-0.025 MG tablet; Take 1 tablet by mouth 4 (four) times daily as needed for diarrhea or loose stools.  Dispense: 60 tablet; Refill: 1  2. History of gastroesophageal reflux (GERD) Controlled - esomeprazole (NEXIUM) 20 MG capsule; Take 1 capsule (20 mg total) by mouth daily.  Dispense: 90 capsule; Refill: 1  3. Anxiety Controlled - FLUoxetine (PROZAC) 40 MG capsule; Take 1 capsule (40 mg total) by mouth at bedtime.  Dispense: 90 capsule; Refill: 1 - hydrOXYzine (ATARAX/VISTARIL) 10 MG tablet; Take 1 tablet (10 mg total) by mouth 2 (two) times daily as needed.  Dispense: 180 tablet; Refill: 1  4. Type 2 diabetes mellitus with hyperglycemia, with long-term current use of insulin (HCC) Uncontrolled  with A1c of 10.6 which has improved from 12.6 previously NovoLog 10 units administered due to elevated CBG of 396 and blood sugar repeated after 30 minutes was 315 Increase Lantus dose Counseled on Diabetic diet, my plate method, 088 minutes of moderate intensity exercise/week Keep blood sugar logs with fasting goals of 80-120 mg/dl, random of less than 180 and in the event of sugars less than 60 mg/dl or greater than 400 mg/dl please notify the clinic ASAP. It is recommended that you undergo annual eye exams and annual foot exams. Pneumonia vaccine is recommended. - POCT glycosylated hemoglobin (Hb A1C) - POCT glucose (manual entry) - glimepiride (AMARYL) 4 MG tablet; Take 2 tablets (8 mg total) by mouth daily with breakfast.  Dispense: 180 tablet; Refill: 1 - Insulin Glargine (LANTUS SOLOSTAR) 100 UNIT/ML Solostar Pen; Inject 40 Units into the skin at bedtime. I  Dispense: 5 pen; Refill: 3 - pravastatin (PRAVACHOL) 40 MG tablet; Take 1 tablet (40 mg total) by mouth daily.  Dispense: 90 tablet; Refill: 1 - insulin aspart (novoLOG) injection 10 Units - POCT glucose (manual entry) - CMP14+EGFR  5. Rash Unknown etiology Requests cortisone injection however I have informed that this will increase her sugar and it is  not indicated at this time She did well on triamcinolone in the past-we will try that again - hydrOXYzine (ATARAX/VISTARIL) 10 MG tablet; Take 1 tablet (10 mg total) by mouth 2 (two) times daily as needed.  Dispense: 180 tablet; Refill: 1 - triamcinolone cream (KENALOG) 0.1 %; Apply 1 application topically 2 (two) times daily.  Dispense: 30 g; Refill: 0  6. Benign paroxysmal positional vertigo due to bilateral vestibular disorder Stable - meclizine (ANTIVERT) 25 MG tablet; Take 1 tablet (25 mg total) by mouth 3 (three) times daily as needed for dizziness.  Dispense: 180 tablet; Refill: 1  7. Essential hypertension Controlled Counseled on blood pressure goal of less than 130/80,  low-sodium, DASH diet, medication compliance, 150 minutes of moderate intensity exercise per week. Discussed medication compliance, adverse effects. - metoprolol succinate (TOPROL-XL) 25 MG 24 hr tablet; Take 1 tablet (25 mg total) by mouth daily.  Dispense: 90 tablet; Refill: 1  8. Simple chronic bronchitis (HCC) Controlled - tiotropium (SPIRIVA) 18 MCG inhalation capsule; Place 1 capsule (18 mcg total) into inhaler and inhale daily.  Dispense: 90 capsule; Refill: 1   Meds ordered this encounter  Medications  . dicyclomine (BENTYL) 10 MG capsule    Sig: Take 1 capsule (10 mg total) by mouth 3 (three) times daily before meals.    Dispense:  270 capsule    Refill:  1  . diphenoxylate-atropine (LOMOTIL) 2.5-0.025 MG tablet    Sig: Take 1 tablet by mouth 4 (four) times daily as needed for diarrhea or loose stools.    Dispense:  60 tablet    Refill:  1  . esomeprazole (NEXIUM) 20 MG capsule    Sig: Take 1 capsule (20 mg total) by mouth daily.    Dispense:  90 capsule    Refill:  1  . fenofibrate (TRICOR) 48 MG tablet    Sig: Take 1 tablet (48 mg total) by mouth daily.    Dispense:  90 tablet    Refill:  1  . FLUoxetine (PROZAC) 40 MG capsule    Sig: Take 1 capsule (40 mg total) by mouth at bedtime.    Dispense:  90 capsule    Refill:  1  . glimepiride (AMARYL) 4 MG tablet    Sig: Take 2 tablets (8 mg total) by mouth daily with breakfast.    Dispense:  180 tablet    Refill:  1  . hydrOXYzine (ATARAX/VISTARIL) 10 MG tablet    Sig: Take 1 tablet (10 mg total) by mouth 2 (two) times daily as needed.    Dispense:  180 tablet    Refill:  1  . Insulin Glargine (LANTUS SOLOSTAR) 100 UNIT/ML Solostar Pen    Sig: Inject 40 Units into the skin at bedtime. I    Dispense:  5 pen    Refill:  3    Discontinue previous dose  . meclizine (ANTIVERT) 25 MG tablet    Sig: Take 1 tablet (25 mg total) by mouth 3 (three) times daily as needed for dizziness.    Dispense:  180 tablet    Refill:  1     This prescription was filled on 06/20/2018. Any refills authorized will be placed on file.  . metoprolol succinate (TOPROL-XL) 25 MG 24 hr tablet    Sig: Take 1 tablet (25 mg total) by mouth daily.    Dispense:  90 tablet    Refill:  1  . pravastatin (PRAVACHOL) 40 MG tablet    Sig: Take 1 tablet (  40 mg total) by mouth daily.    Dispense:  90 tablet    Refill:  1  . tiotropium (SPIRIVA) 18 MCG inhalation capsule    Sig: Place 1 capsule (18 mcg total) into inhaler and inhale daily.    Dispense:  90 capsule    Refill:  1  . tiZANidine (ZANAFLEX) 4 MG tablet    Sig: Take 1 tablet (4 mg total) by mouth every 8 (eight) hours as needed for muscle spasms.    Dispense:  90 tablet    Refill:  1  . triamcinolone cream (KENALOG) 0.1 %    Sig: Apply 1 application topically 2 (two) times daily.    Dispense:  30 g    Refill:  0  . insulin aspart (novoLOG) injection 10 Units    Follow-up: Return in about 3 months (around 01/01/2019) for Follow-up of chronic medical conditions.       Charlott Rakes, MD, FAAFP. [facility] [place], [place]   10/02/2018, 2:51 PM

## 2018-10-03 LAB — CMP14+EGFR
ALT: 21 IU/L (ref 0–32)
AST: 17 IU/L (ref 0–40)
Albumin/Globulin Ratio: 1.8 (ref 1.2–2.2)
Albumin: 4.3 g/dL (ref 3.8–4.9)
Alkaline Phosphatase: 118 IU/L — ABNORMAL HIGH (ref 39–117)
BUN/Creatinine Ratio: 8 — ABNORMAL LOW (ref 9–23)
BUN: 5 mg/dL — ABNORMAL LOW (ref 6–24)
Bilirubin Total: <0.2 mg/dL (ref 0.0–1.2)
CO2: 23 mmol/L (ref 20–29)
Calcium: 9.1 mg/dL (ref 8.7–10.2)
Chloride: 103 mmol/L (ref 96–106)
Creatinine, Ser: 0.61 mg/dL (ref 0.57–1.00)
GFR calc Af Amer: 116 mL/min/{1.73_m2} (ref >59)
GFR calc non Af Amer: 101 mL/min/{1.73_m2} (ref >59)
Globulin, Total: 2.4 g/dL (ref 1.5–4.5)
Glucose: 318 mg/dL — ABNORMAL HIGH (ref 65–99)
Potassium: 4 mmol/L (ref 3.5–5.2)
Sodium: 141 mmol/L (ref 134–144)
Total Protein: 6.7 g/dL (ref 6.0–8.5)

## 2018-10-05 ENCOUNTER — Telehealth: Payer: Self-pay

## 2018-10-05 NOTE — Telephone Encounter (Signed)
-----   Message from Hoy Register, MD sent at 10/04/2018  1:24 PM EDT ----- Labs are stable except for hyperglycemia.  Please advise to comply with new regimen of Lantus discussed at her most recent office visit.

## 2018-10-05 NOTE — Telephone Encounter (Signed)
Patient name and DOB has been verified Patient was informed of lab results. Patient had no questions.  

## 2018-10-16 ENCOUNTER — Other Ambulatory Visit: Payer: Self-pay | Admitting: Family Medicine

## 2018-10-16 DIAGNOSIS — K580 Irritable bowel syndrome with diarrhea: Secondary | ICD-10-CM

## 2018-10-24 ENCOUNTER — Telehealth: Payer: Self-pay | Admitting: Family Medicine

## 2018-10-24 IMAGING — DX DG CHEST 2V
2 series · 2 of 2 positions shown · non-contrast
Comparison: 12/13/2017

CLINICAL DATA: Cough with back pain

EXAM:
CHEST - 2 VIEW

[w chest pa]
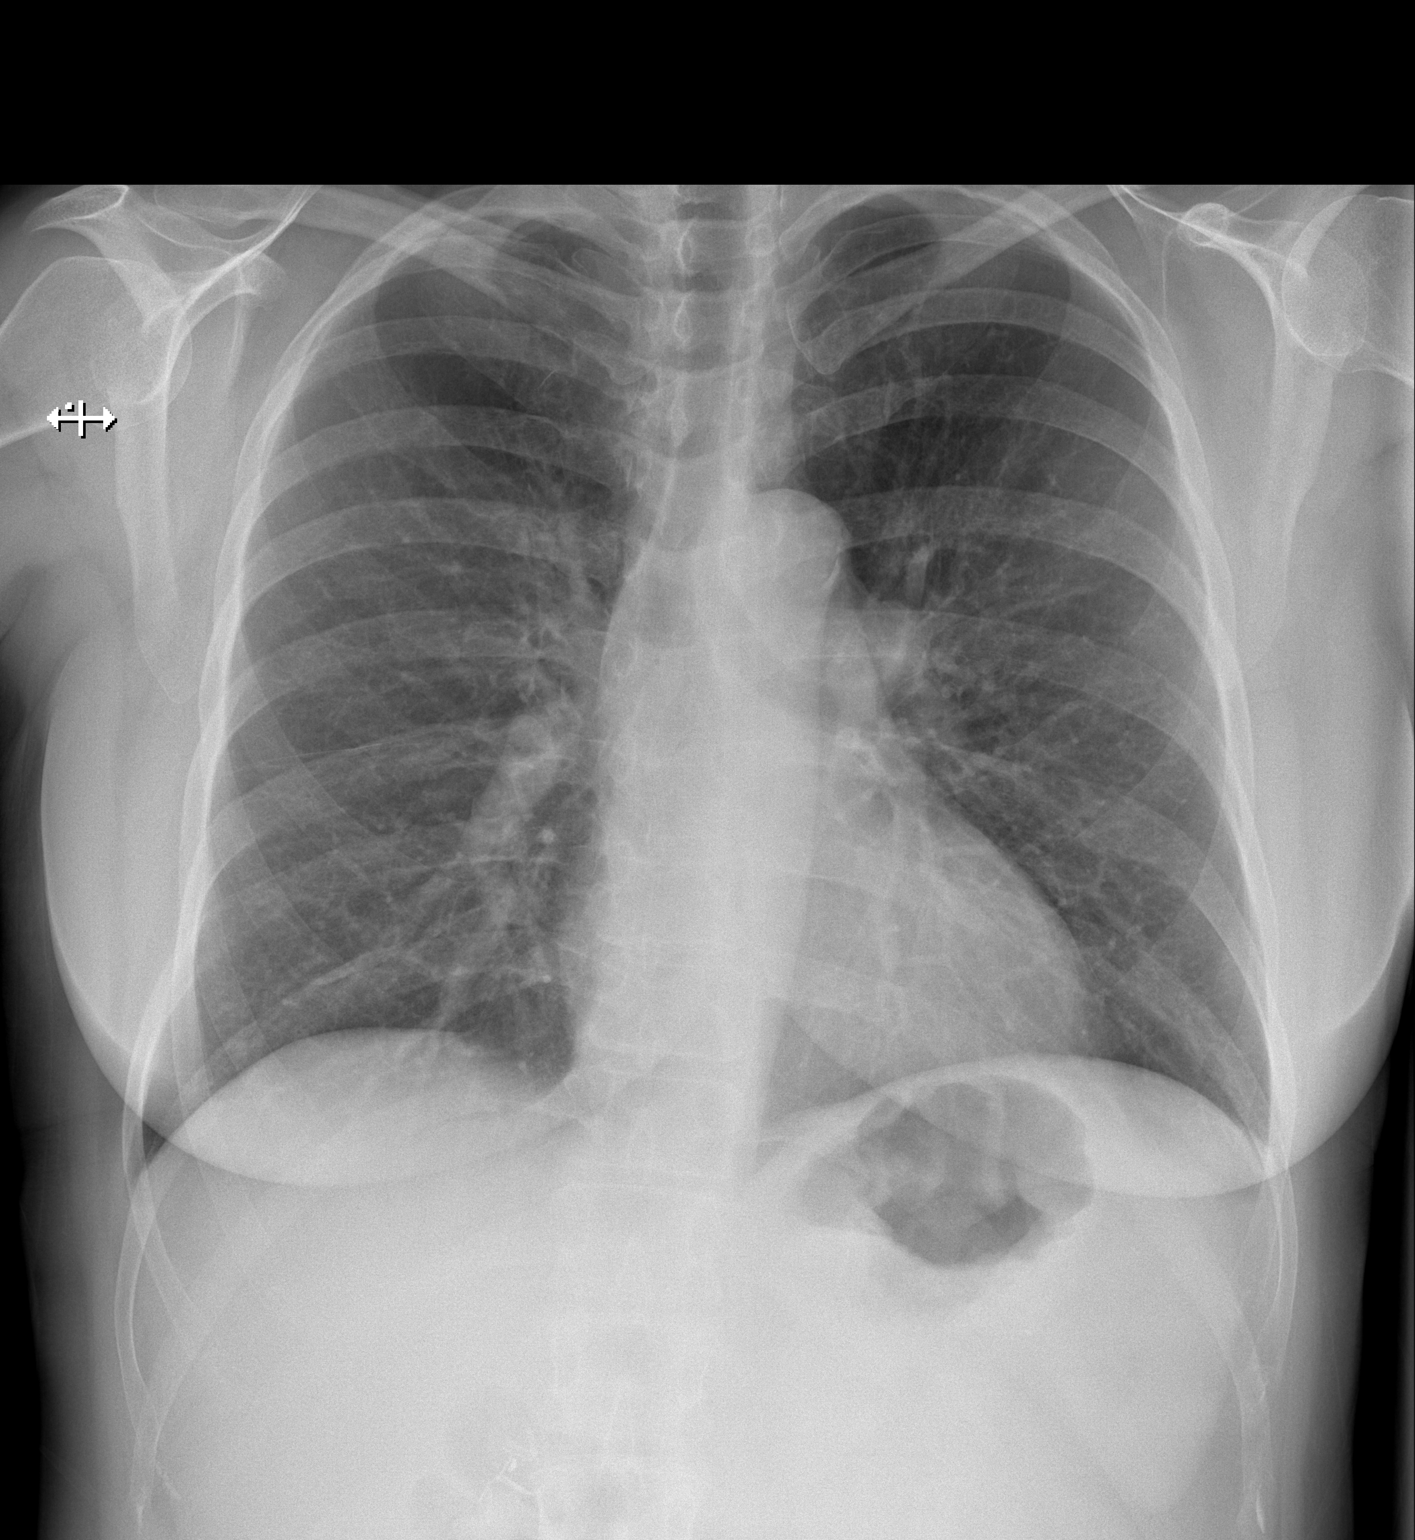

[w chest lat]
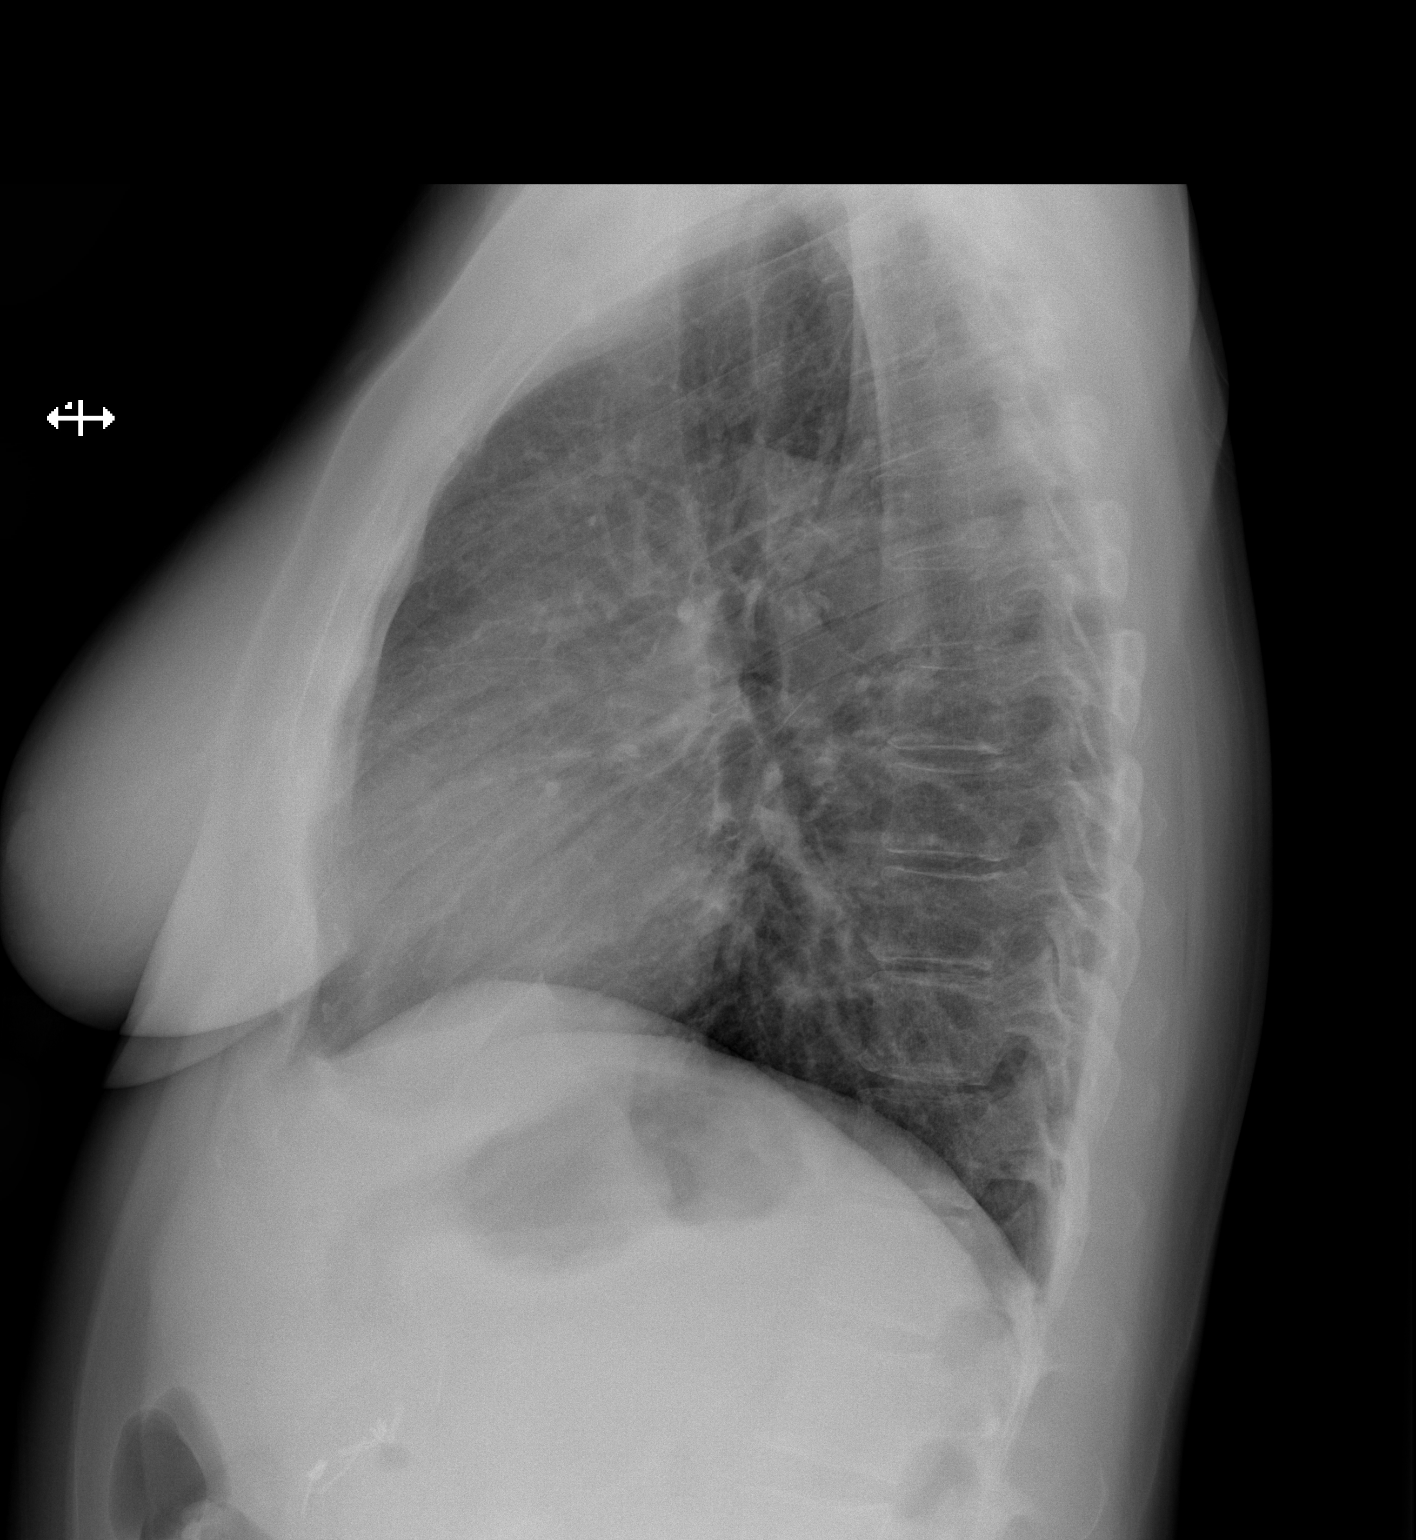

[2 of 2 positions shown; findings below may reference images not displayed]

FINDINGS: Normal heart size and mediastinal contours. Atherosclerotic
calcification. There is no edema, consolidation, effusion, or
pneumothorax. Presumed cholecystectomy clips. No acute osseous
finding.
IMPRESSION: No evidence of active disease.

## 2018-10-24 NOTE — Telephone Encounter (Signed)
Paperwork has been refaxed over to be filled out by PCP

## 2018-10-24 NOTE — Telephone Encounter (Signed)
New Message   [name] is calling from [employer], she states she faxed a form last week regarding the pt and wants to makes sure it was received. Please f/u

## 2018-10-26 ENCOUNTER — Other Ambulatory Visit: Payer: Self-pay | Admitting: Family Medicine

## 2018-10-26 DIAGNOSIS — F419 Anxiety disorder, unspecified: Secondary | ICD-10-CM

## 2018-10-26 NOTE — Telephone Encounter (Signed)
90 day supply of fluoxetine 40 mg sent on 10/02/18.

## 2018-10-27 ENCOUNTER — Other Ambulatory Visit: Payer: Self-pay | Admitting: Family Medicine

## 2018-10-27 DIAGNOSIS — H8113 Benign paroxysmal vertigo, bilateral: Secondary | ICD-10-CM

## 2018-10-28 ENCOUNTER — Other Ambulatory Visit: Payer: Self-pay | Admitting: Family Medicine

## 2018-10-28 DIAGNOSIS — H8113 Benign paroxysmal vertigo, bilateral: Secondary | ICD-10-CM

## 2018-10-31 ENCOUNTER — Other Ambulatory Visit: Payer: Self-pay | Admitting: Family Medicine

## 2018-10-31 DIAGNOSIS — K580 Irritable bowel syndrome with diarrhea: Secondary | ICD-10-CM

## 2018-11-01 ENCOUNTER — Telehealth: Payer: Self-pay | Admitting: Family Medicine

## 2018-11-01 NOTE — Telephone Encounter (Signed)
[name] with [employer] called to check on the status of the forms to be filled out by PCP informed that it has already been faxed over and [name] states she has not received them. Sharyl Nimrod provided the fax #[phone] please follow up

## 2018-11-02 NOTE — Telephone Encounter (Signed)
Paperwork has not been completed by PCP.

## 2018-11-23 ENCOUNTER — Ambulatory Visit: Payer: Medicaid Other | Admitting: Orthopedic Surgery

## 2018-11-29 ENCOUNTER — Ambulatory Visit: Payer: Medicaid Other | Admitting: Orthopedic Surgery

## 2018-12-11 ENCOUNTER — Other Ambulatory Visit: Payer: Self-pay

## 2018-12-11 ENCOUNTER — Encounter (HOSPITAL_COMMUNITY): Payer: Self-pay | Admitting: *Deleted

## 2018-12-11 ENCOUNTER — Emergency Department (HOSPITAL_COMMUNITY)
Admission: EM | Admit: 2018-12-11 | Discharge: 2018-12-12 | Disposition: A | Discharge status: Home / Self Care | Payer: Medicaid Other | Attending: Emergency Medicine | Admitting: Emergency Medicine

## 2018-12-11 DIAGNOSIS — J449 Chronic obstructive pulmonary disease, unspecified: Secondary | ICD-10-CM | POA: Insufficient documentation

## 2018-12-11 DIAGNOSIS — Z79899 Other long term (current) drug therapy: Secondary | ICD-10-CM | POA: Diagnosis not present

## 2018-12-11 DIAGNOSIS — Z794 Long term (current) use of insulin: Secondary | ICD-10-CM | POA: Insufficient documentation

## 2018-12-11 DIAGNOSIS — R519 Headache, unspecified: Secondary | ICD-10-CM

## 2018-12-11 DIAGNOSIS — I4891 Unspecified atrial fibrillation: Secondary | ICD-10-CM | POA: Diagnosis not present

## 2018-12-11 DIAGNOSIS — F17210 Nicotine dependence, cigarettes, uncomplicated: Secondary | ICD-10-CM | POA: Insufficient documentation

## 2018-12-11 DIAGNOSIS — I110 Hypertensive heart disease with heart failure: Secondary | ICD-10-CM | POA: Diagnosis not present

## 2018-12-11 DIAGNOSIS — R51 Headache: Secondary | ICD-10-CM | POA: Diagnosis present

## 2018-12-11 DIAGNOSIS — I5022 Chronic systolic (congestive) heart failure: Secondary | ICD-10-CM | POA: Diagnosis not present

## 2018-12-11 DIAGNOSIS — E119 Type 2 diabetes mellitus without complications: Secondary | ICD-10-CM | POA: Diagnosis not present

## 2018-12-11 LAB — BASIC METABOLIC PANEL
Anion gap: 10 (ref 5–15)
BUN: 5 mg/dL — ABNORMAL LOW (ref 6–20)
CO2: 24 mmol/L (ref 22–32)
Calcium: 9.2 mg/dL (ref 8.9–10.3)
Chloride: 103 mmol/L (ref 98–111)
Creatinine, Ser: 0.53 mg/dL (ref 0.44–1.00)
GFR calc Af Amer: >60 mL/min (ref >60)
GFR calc non Af Amer: >60 mL/min (ref >60)
Glucose, Bld: 290 mg/dL — ABNORMAL HIGH (ref 70–99)
Potassium: 4 mmol/L (ref 3.5–5.1)
Sodium: 137 mmol/L (ref 135–145)

## 2018-12-11 LAB — CBC
HCT: 45.8 % (ref 36.0–46.0)
Hemoglobin: 14.6 g/dL (ref 12.0–15.0)
MCH: 27.4 pg (ref 26.0–34.0)
MCHC: 31.9 g/dL (ref 30.0–36.0)
MCV: 86.1 fL (ref 80.0–100.0)
Platelets: 182 10*3/uL (ref 150–400)
RBC: 5.32 MIL/uL — ABNORMAL HIGH (ref 3.87–5.11)
RDW: 13 % (ref 11.5–15.5)
WBC: 9.5 10*3/uL (ref 4.0–10.5)
nRBC: 0 % (ref 0.0–0.2)

## 2018-12-11 MED ORDER — PROCHLORPERAZINE EDISYLATE 10 MG/2ML IJ SOLN
10.0000 mg | Freq: Once | INTRAMUSCULAR | Status: AC
  Administered 2018-12-11: 10 mg via INTRAVENOUS
  Filled 2018-12-11: qty 2

## 2018-12-11 MED ORDER — SUMATRIPTAN SUCCINATE 50 MG PO TABS: ORAL_TABLET | ORAL | 0 refills | Status: DC

## 2018-12-11 MED ORDER — SODIUM CHLORIDE 0.9% FLUSH: 3.0000 mL | Freq: Once | INTRAVENOUS | Status: DC

## 2018-12-11 MED ORDER — KETOROLAC TROMETHAMINE 30 MG/ML IJ SOLN
30.0000 mg | Freq: Once | INTRAMUSCULAR | Status: AC
  Administered 2018-12-11: 30 mg via INTRAVENOUS
  Filled 2018-12-11: qty 1

## 2018-12-11 MED ORDER — SODIUM CHLORIDE 0.9 % IV BOLUS
500.0000 mL | Freq: Once | INTRAVENOUS | Status: AC
  Administered 2018-12-11: 500 mL via INTRAVENOUS

## 2018-12-11 MED ORDER — DIPHENHYDRAMINE HCL 50 MG/ML IJ SOLN
25.0000 mg | Freq: Once | INTRAMUSCULAR | Status: AC
  Administered 2018-12-11: 23:00:00 25 mg via INTRAVENOUS
  Filled 2018-12-11: qty 1

## 2018-12-11 NOTE — ED Triage Notes (Addendum)
Pt says for several days she has had a posterior headache. Today, she woke up and it feels like her neck is stiff and she cannot move it left or right without pain. She reports this evening she had an episode where the "room started spinning around" and then she vomited. The dizziness subsided after that. BP 190's at home.  A/O, clear speech.

## 2018-12-11 NOTE — Discharge Instructions (Addendum)
Thank you for allowing me to care for you today in the Emergency Department.   You can take 1 tablet of Imitrex if you develop another headache.  You may take 1 additional tablet in 2 hours if the headache does not resolve.  Follow-up with your primary care provider for recheck of your symptoms within the next week.  Return to the emergency department if you develop a severe headache with new changes in your vision, difficulty walking, new numbness or weakness, if you start peeing or pooping on yourself, with severe chest pain or shortness of breath, or other new, concerning symptoms.

## 2018-12-11 NOTE — ED Provider Notes (Signed)
[facility] EMERGENCY DEPARTMENT Provider Note   CSN: [id number] Arrival date & time: 12/11/18  2139    History   Chief Complaint Chief Complaint  Patient presents with  . Headache    HPI [name] is a 58 y.o. female with a history of hypertension, osteomyelitis, COPD, diabetes mellitus type 2, paroxysmal atrial fibrillation, migraines, chronic systolic heart failure, and GERD who presents to the emergency department with a chief complaint of headache.  The patient endorses a right-sided posterior headache that began when she awoke approximately 8 to 10 days ago.  The headache is nonradiating and has been constant since onset, but gradually worsening in intensity.  She reports that she has a remote history of migraines, but has not had a migraine in several years.  She reports that this morning that she developed pain when rotating her neck to the right as well as room spinning dizziness when she would turn her head to the left.  She reports that the dizziness will persist as long as her neck is rotated to the left, but resolves instantly when she rotates her neck back to midline.  She reports a remote history of migraines that were typically present in the posterior part of her head on either the right or left side.  Reports the headache she currently has it feels similar to previous migraines, but reports that she has not had a migraine an episode several years.  She treated her symptoms with 3 tablets of extra strength Tylenol and 800 mg of ibuprofen earlier today without improvement in her symptoms.  She reports that she did have an episode of chest pressure earlier tonight, but reports that this felt similar to previous episodes of A. fib.  She is not having any chest pain currently.  She reports that she did have an episode where she felt as though she might pass out, felt her heart start racing, and had an episode of vomiting earlier today.  She reports  that she has had similar episodes almost daily for years.  She denies fever, chills, new numbness or weakness, gait instability, visual changes, or confusion.  No recent falls or trauma.  Reports that she was diagnosed with vertigo several years ago and takes meclizine twice daily.  She has been compliant with her home metoprolol with no recent missed doses.  She reports that she was diagnosed with bursitis in her right shoulder approximately 1 month ago and is due for a corticosteroid injection.     The history is provided by the patient. No language interpreter was used.    Past Medical History:  Diagnosis Date  . Anxiety   . Arthritis   . Chronic systolic heart failure (HCC)    EF 20-25% ECHO 05/2015  . COPD (chronic obstructive pulmonary disease) ([place])   . Depression   . Diabetes mellitus without complication ([place])    Type II  . Dysrhythmia    pt unsure of name of arrythmia - " my heart rate will drop all of a sudden" -no current treatment - atrial flutter  . Fibrillation, atrial ([place])   . Gallstones   . GERD (gastroesophageal reflux disease)   . Headache(784.0)    migraines  . Hypertension   . Osteomyelitis (HCC)    R ankle  . Rash    arms    Patient Active Problem List   Diagnosis Date Noted  . 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 Primary osteoarthritis of both hands 10/06/2016  .  History of gastroesophageal reflux (GERD) 10/06/2016  . History of COPD 10/06/2016  . History of anxiety 10/06/2016  . History of CHF (congestive heart failure) 10/06/2016  . History of hypertension 10/06/2016  . History of cardiac dysrhythmia 10/06/2016  . Primary osteoarthritis of both feet 10/06/2016  . Primary osteoarthritis of both knees 10/06/2016  . History of infection/ ankle post operative  10/06/2016  . Rheumatoid factor positive 10/06/2016  . De Quervain's disease (radial styloid tenosynovitis) 06/09/2016  . Tobacco abuse 05/23/2016  . Hyperglycemia without ketosis   . Bleeding  hemorrhoids 11/03/2015  . Acute blood loss anemia 11/03/2015  . Hematochezia 11/01/2015  . Back pain with sciatica 09/08/2015  . Infection of bone of ankle ([place]) 07/15/2015  . Chronic systolic CHF (congestive heart failure) ([place]) 06/24/2015  . Chest pain 06/03/2015  . Hypertension   . Anxiety   . Gallstones   . GERD (gastroesophageal reflux disease)   . COPD (chronic obstructive pulmonary disease) ([place])   . Dysrhythmia   . Rash   . Depression   . Diabetes mellitus without complication ([place])   . Post-operative state 12/10/2011    Past Surgical History:  Procedure Laterality Date  . ANKLE FRACTURE SURGERY Right 2015  . CARDIAC CATHETERIZATION N/A 06/24/2015   Procedure: Left Heart Cath and Coronary Angiography;  Surgeon: Belva Crome, MD;  Location: [facility];  Service: Cardiovascular;  Laterality: N/A;  . CARPAL TUNNEL RELEASE     bil  . CHOLECYSTECTOMY  11/24/2011   Procedure: LAPAROSCOPIC CHOLECYSTECTOMY WITH INTRAOPERATIVE CHOLANGIOGRAM;  Surgeon: Joyice Faster. Cornett, MD;  Location: [facility];  Service: General;  Laterality: N/A;  Laparoscopic Cholecystectomy with Cholangiogram  . COLONOSCOPY    . ECTOPIC PREGNANCY SURGERY  many yrs ago  . HARDWARE REMOVAL Right 07/15/2015   Procedure: RIGHT ANKLE HARDWARE REMOVAL, PLACEMENT OF STIMULAN ANTIBIOTIC BEADS AND PREVENA WOUND VAC.;  Surgeon: Meredith Pel, MD;  Location: [facility];  Service: Orthopedics;  Laterality: Right;  . TOOTH EXTRACTION N/A 12/03/2016   Procedure: DENTAL EXTRACTIONS with Alveoloplasty;  Surgeon: Diona Browner, DDS;  Location: [facility];  Service: Oral Surgery;  Laterality: N/A;     OB History    Gravida  9   Para      Term      Preterm      AB  5   Living  4     SAB  4   TAB      Ectopic  1   Multiple      Live Births  4            Home Medications    Prior to Admission medications   Medication Sig Start Date End Date Taking? Authorizing Provider  ACCU-CHEK FASTCLIX LANCETS MISC  Use as directed to test blood sugar up to four times daily 05/22/18   Charlott Rakes, MD  ACCU-CHEK SOFTCLIX LANCETS lancets USE AS DIRECTED DAILY UP TO THREE TIMES DAILY 04/18/18   Antonietta Breach, [name]  albuterol (PROVENTIL HFA;VENTOLIN HFA) 108 (90 Base) MCG/ACT inhaler Inhale 2 puffs into the lungs every 4 (four) hours as needed for wheezing or shortness of breath. 09/12/17   Noemi Chapel, MD  Blood Glucose Monitoring Suppl (ACCU-CHEK GUIDE) w/Device KIT 1 each by Does not apply route 4 (four) times daily. 05/22/18   Charlott Rakes, MD  dicyclomine (BENTYL) 10 MG capsule Take 1 capsule (10 mg total) by mouth 3 (three) times daily before meals. 10/02/18   Charlott Rakes, MD  diphenoxylate-atropine (LOMOTIL) 2.5-0.025 MG tablet Take 1 tablet by mouth 4 (four) times daily as needed for diarrhea or loose stools. 10/18/18   Charlott Rakes, MD  esomeprazole (NEXIUM) 20 MG capsule Take 1 capsule (20 mg total) by mouth daily. 10/02/18   Charlott Rakes, MD  fenofibrate (TRICOR) 48 MG tablet Take 1 tablet (48 mg total) by mouth daily. 10/02/18   Charlott Rakes, MD  FLUoxetine (PROZAC) 40 MG capsule Take 1 capsule (40 mg total) by mouth at bedtime. 10/02/18   Charlott Rakes, MD  glimepiride (AMARYL) 4 MG tablet Take 2 tablets (8 mg total) by mouth daily with breakfast. 10/02/18   Charlott Rakes, MD  glucose blood (ACCU-CHEK GUIDE) test strip Use as instructed to test blood sugar up to four times daily 05/22/18   Charlott Rakes, MD  hydrOXYzine (ATARAX/VISTARIL) 10 MG tablet Take 1 tablet (10 mg total) by mouth 2 (two) times daily as needed. 10/02/18   Charlott Rakes, MD  Insulin Glargine (LANTUS SOLOSTAR) 100 UNIT/ML Solostar Pen Inject 40 Units into the skin at bedtime. I 10/02/18   Charlott Rakes, MD  Insulin Pen Needle 31G X 5 MM MISC 1 each by Does not apply route at bedtime. 03/29/17   Charlott Rakes, MD  Lancet Devices [facility]) lancets Use as instructed daily. 11/24/16   Charlott Rakes, MD   meclizine (ANTIVERT) 25 MG tablet Take 1 tablet (25 mg total) by mouth 3 (three) times daily as needed for dizziness. 10/31/18   Charlott Rakes, MD  metoprolol succinate (TOPROL-XL) 25 MG 24 hr tablet Take 1 tablet (25 mg total) by mouth daily. 10/02/18   Charlott Rakes, MD  orphenadrine (NORFLEX) 100 MG tablet Take 1 tablet (100 mg total) by mouth 2 (two) times daily. 05/04/18   Charlesetta Shanks, MD  pravastatin (PRAVACHOL) 40 MG tablet Take 1 tablet (40 mg total) by mouth daily. 10/02/18   Charlott Rakes, MD  SUMAtriptan (IMITREX) 50 MG tablet May repeat once in 2 hours if headache persists or recurs. 12/11/18   Staceyann Knouff A, [name]  tiotropium (SPIRIVA) 18 MCG inhalation capsule Place 1 capsule (18 mcg total) into inhaler and inhale daily. 10/02/18   Charlott Rakes, MD  tiZANidine (ZANAFLEX) 4 MG tablet Take 1 tablet (4 mg total) by mouth every 8 (eight) hours as needed for muscle spasms. 10/30/18   Charlott Rakes, MD  triamcinolone cream (KENALOG) 0.1 % Apply 1 application topically 2 (two) times daily. 10/02/18   Charlott Rakes, MD    Family History Family History  Problem Relation Age of Onset  . Lung cancer Father   . Heart attack Father 50       CABG x4  . Breast cancer Sister        Survivor     Social History Social History   Tobacco Use  . Smoking status: Light Tobacco Smoker    Packs/day: 0.00    Years: 20.00    Pack years: 0.00    Types: Cigarettes, E-cigarettes    Start date: 09/19/2016  . Smokeless tobacco: Never Used  Substance Use Topics  . Alcohol use: No    Alcohol/week: 0.0 standard drinks  . Drug use: No     Allergies   Biaxin [clarithromycin], Clarithromycin, Lisinopril, Sulfa antibiotics, Chlorthalidone, Daptomycin, Latex, Amoxicillin-pot clavulanate, Aspirin, Cholestyramine, Dilaudid [hydromorphone hcl], and Lasix [furosemide]   Review of Systems Review of Systems  Constitutional: Negative for activity change, chills and fever.  Eyes: Negative for  photophobia and visual disturbance.  Respiratory: Negative for  shortness of breath.   Cardiovascular: Positive for chest pain (resolved) and palpitations (resolved).  Gastrointestinal: Positive for vomiting (resolved). Negative for abdominal pain and nausea.  Genitourinary: Negative for dysuria.  Musculoskeletal: Positive for neck pain. Negative for back pain and neck stiffness.  Skin: Negative for rash.  Allergic/Immunologic: Negative for immunocompromised state.  Neurological: Positive for dizziness, syncope (near syncope, chronic) and headaches. Negative for seizures, speech difficulty, weakness, light-headedness and numbness.  Psychiatric/Behavioral: Negative for confusion.   Physical Exam Updated Vital Signs BP (!) 148/74   Pulse 73   Temp 98.5 F (36.9 C) (Oral)   Resp 14   LMP 07/24/2011   SpO2 96%   Physical Exam Vitals signs and nursing note reviewed.  Constitutional:      General: She is not in acute distress. HENT:     Head: Normocephalic.  Eyes:     Extraocular Movements: Extraocular movements intact.     Right eye: No nystagmus.     Left eye: No nystagmus.     Conjunctiva/sclera: Conjunctivae normal.     Pupils: Pupils are equal, round, and reactive to light.  Neck:     Musculoskeletal: Neck supple.     Comments: No meningismus. Cardiovascular:     Rate and Rhythm: Normal rate and regular rhythm.     Heart sounds: Normal heart sounds. No murmur. No friction rub. No gallop.   Pulmonary:     Effort: Pulmonary effort is normal. No respiratory distress.  Abdominal:     General: Bowel sounds are normal. There is no distension.     Palpations: Abdomen is soft.     Tenderness: There is no abdominal tenderness.  Musculoskeletal: Normal range of motion.     Comments: No tenderness to the cervical, thoracic, or lumbar spinous processes or bilateral paraspinal muscles.  No crepitus or step-offs.  Skin:    General: Skin is warm.     Capillary Refill: Capillary refill  takes less than 2 seconds.     Findings: No rash.  Neurological:     Mental Status: She is alert.     GCS: GCS eye subscore is 4. GCS verbal subscore is 5. GCS motor subscore is 6.     Sensory: No sensory deficit.     Comments: Cranial nerves II through XII are grossly intact.  5-5 strength against resistance of the bilateral upper and lower extremities.  Gait is not ataxic.  No pronator drift.  Sensation is intact and equal throughout the bilateral upper and lower extremities.  Finger-to-nose is intact bilaterally. Negative Romberg.   Psychiatric:        Behavior: Behavior normal.    ED Treatments / Results  Labs (all labs ordered are listed, but only abnormal results are displayed) Labs Reviewed  BASIC METABOLIC PANEL - Abnormal; Notable for the following components:      Result Value   Glucose, Bld 290 (*)    BUN 5 (*)    All other components within normal limits  CBC - Abnormal; Notable for the following components:   RBC 5.32 (*)    All other components within normal limits    EKG None  Radiology No results found.  Procedures Procedures (including critical care time)  Medications Ordered in ED Medications  sodium chloride flush (NS) 0.9 % injection 3 mL (has no administration in time range)  sodium chloride 0.9 % bolus 500 mL (500 mLs Intravenous New Bag/Given 12/11/18 2320)  diphenhydrAMINE (BENADRYL) injection 25 mg (25 mg Intravenous Given 12/11/18  2311)  prochlorperazine (COMPAZINE) injection 10 mg (10 mg Intravenous Given 12/11/18 2311)  ketorolac (TORADOL) 30 MG/ML injection 30 mg (30 mg Intravenous Given 12/11/18 2311)     Initial Impression / Assessment and Plan / ED Course  I have reviewed the triage vital signs and the nursing notes.  Pertinent labs & imaging results that were available during my care of the patient were reviewed by me and considered in my medical decision making (see chart for details).        58 year old female with a history of  hypertension, osteomyelitis, COPD, diabetes mellitus type 2, paroxysmal atrial fibrillation, chronic systolic heart failure, and GERD presenting with a headache for the last 8 to 10 days and dizziness with rotation of her neck to the left, onset this morning.   The patient did mention that she had an episode of near syncope, palpitations, and vomiting earlier today, but reports this is chronic she has frequent episodes and has for several years.  No change in symptoms today.  The patient was discussed with Dr. Regenia Skeeter, attending physician.  BP 168/90 arrival improving to 148/74 without treatment in the ER.  Vitals are otherwise unremarkable.  Neuro exam is unremarkable.    She underwent an angio of the neck in January 2019 with bilateral patent vertebral arteries no evidence of aneurysm.  She had a normal MRI brain in 2016. Labs today are notable for hyperglycemia of 290 with normal anion gap and bicarb.  She was given a migraine cocktail and on re-evaluation dizziness and headache had both resolved.   I have a low suspicion for vertebral artery dissection, CVA, meningitis, temporal arteritis, or SAH. Pt is to follow up with PCP.  We will discharge the patient with a trial of Imitrex.  Return precautions to the ER given.  Pt verbalizes understanding and is agreeable with plan to dc.   Final Clinical Impressions(s) / ED Diagnoses   Final diagnoses:  Bad headache    ED Discharge Orders         Ordered    SUMAtriptan (IMITREX) 50 MG tablet     12/11/18 2349           Clete Kuch A, [name] 12/12/18 0000    Sherwood Gambler, MD 12/12/18 1243

## 2018-12-12 NOTE — ED Notes (Signed)
Patient verbalizes understanding of discharge instructions. Opportunity for questioning and answers were provided. Armband removed by staff, pt discharged from ED.  

## 2018-12-14 ENCOUNTER — Other Ambulatory Visit: Payer: Self-pay | Admitting: Family Medicine

## 2018-12-14 DIAGNOSIS — Z794 Long term (current) use of insulin: Secondary | ICD-10-CM

## 2018-12-14 DIAGNOSIS — E1165 Type 2 diabetes mellitus with hyperglycemia: Secondary | ICD-10-CM

## 2018-12-22 ENCOUNTER — Other Ambulatory Visit: Payer: Self-pay | Admitting: Family Medicine

## 2019-01-01 ENCOUNTER — Ambulatory Visit: Payer: Medicaid Other | Attending: Family Medicine | Admitting: Family Medicine

## 2019-01-01 ENCOUNTER — Other Ambulatory Visit: Payer: Self-pay

## 2019-01-10 ENCOUNTER — Other Ambulatory Visit: Payer: Self-pay | Admitting: Family Medicine

## 2019-01-10 DIAGNOSIS — E1165 Type 2 diabetes mellitus with hyperglycemia: Secondary | ICD-10-CM

## 2019-01-10 DIAGNOSIS — Z794 Long term (current) use of insulin: Secondary | ICD-10-CM

## 2019-01-13 ENCOUNTER — Emergency Department (HOSPITAL_COMMUNITY)
Admission: EM | Admit: 2019-01-13 | Discharge: 2019-01-14 | Disposition: A | Discharge status: Home / Self Care | Payer: Medicaid Other | Attending: Emergency Medicine | Admitting: Emergency Medicine

## 2019-01-13 ENCOUNTER — Other Ambulatory Visit: Payer: Self-pay

## 2019-01-13 DIAGNOSIS — I4891 Unspecified atrial fibrillation: Secondary | ICD-10-CM | POA: Diagnosis not present

## 2019-01-13 DIAGNOSIS — I5022 Chronic systolic (congestive) heart failure: Secondary | ICD-10-CM | POA: Insufficient documentation

## 2019-01-13 DIAGNOSIS — J449 Chronic obstructive pulmonary disease, unspecified: Secondary | ICD-10-CM | POA: Diagnosis not present

## 2019-01-13 DIAGNOSIS — Z79899 Other long term (current) drug therapy: Secondary | ICD-10-CM | POA: Insufficient documentation

## 2019-01-13 DIAGNOSIS — L03211 Cellulitis of face: Secondary | ICD-10-CM

## 2019-01-13 DIAGNOSIS — E119 Type 2 diabetes mellitus without complications: Secondary | ICD-10-CM | POA: Diagnosis not present

## 2019-01-13 DIAGNOSIS — I110 Hypertensive heart disease with heart failure: Secondary | ICD-10-CM | POA: Insufficient documentation

## 2019-01-13 DIAGNOSIS — Z7984 Long term (current) use of oral hypoglycemic drugs: Secondary | ICD-10-CM | POA: Diagnosis not present

## 2019-01-13 DIAGNOSIS — M7989 Other specified soft tissue disorders: Secondary | ICD-10-CM | POA: Diagnosis present

## 2019-01-13 DIAGNOSIS — R220 Localized swelling, mass and lump, head: Secondary | ICD-10-CM

## 2019-01-13 DIAGNOSIS — F17210 Nicotine dependence, cigarettes, uncomplicated: Secondary | ICD-10-CM | POA: Insufficient documentation

## 2019-01-13 NOTE — ED Triage Notes (Signed)
Per pt she has a boil on the left temple area that showed up two days ago and then under her eye started swelling today. Pt said tender to touch. Pt has had several areas on her face that she said she has been coming up and healing.

## 2019-01-14 MED ORDER — DOXYCYCLINE HYCLATE 100 MG PO CAPS: 100.0000 mg | ORAL_CAPSULE | Freq: Two times a day (BID) | ORAL | 0 refills | Status: DC

## 2019-01-14 NOTE — ED Provider Notes (Signed)
[facility] EMERGENCY DEPARTMENT Provider Note   CSN: [id number] Arrival date & time: 01/13/19  2243     History   Chief Complaint Chief Complaint  Patient presents with  . Facial Swelling    HPI [name] is a 58 y.o. female.     The history is provided by the patient.  Abscess Location:  Face Facial abscess location:  L cheek Abscess quality: induration, painful and redness   Progression:  Worsening Pain details:    Quality:  Dull   Severity:  Moderate   Timing:  Constant   Progression:  Worsening Chronicity:  New Context: diabetes   Relieved by:  Nothing Worsened by:  Nothing Associated symptoms: no fever and no vomiting    Patient reports boil to left side of her face over the past 2 days.  She reports some swelling to her face.  This is similar to prior wounds she has had on her arms Past Medical History:  Diagnosis Date  . Anxiety   . Arthritis   . Chronic systolic heart failure (HCC)    EF 20-25% ECHO 05/2015  . COPD (chronic obstructive pulmonary disease) ([place])   . Depression   . Diabetes mellitus without complication ([place])    Type II  . Dysrhythmia    pt unsure of name of arrythmia - " my heart rate will drop all of a sudden" -no current treatment - atrial flutter  . Fibrillation, atrial ([place])   . Gallstones   . GERD (gastroesophageal reflux disease)   . Headache(784.0)    migraines  . Hypertension   . Osteomyelitis (HCC)    R ankle  . Rash    arms    Patient Active Problem List   Diagnosis Date Noted  . 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 Primary osteoarthritis of both hands 10/06/2016  . History of gastroesophageal reflux (GERD) 10/06/2016  . History of COPD 10/06/2016  . History of anxiety 10/06/2016  . History of CHF (congestive heart failure) 10/06/2016  . History of hypertension 10/06/2016  . History of cardiac dysrhythmia 10/06/2016  . Primary osteoarthritis of both feet 10/06/2016  . Primary  osteoarthritis of both knees 10/06/2016  . History of infection/ ankle post operative  10/06/2016  . Rheumatoid factor positive 10/06/2016  . De Quervain's disease (radial styloid tenosynovitis) 06/09/2016  . Tobacco abuse 05/23/2016  . Hyperglycemia without ketosis   . Bleeding hemorrhoids 11/03/2015  . Acute blood loss anemia 11/03/2015  . Hematochezia 11/01/2015  . Back pain with sciatica 09/08/2015  . Infection of bone of ankle ([place]) 07/15/2015  . Chronic systolic CHF (congestive heart failure) ([place]) 06/24/2015  . Chest pain 06/03/2015  . Hypertension   . Anxiety   . Gallstones   . GERD (gastroesophageal reflux disease)   . COPD (chronic obstructive pulmonary disease) ([place])   . Dysrhythmia   . Rash   . Depression   . Diabetes mellitus without complication ([place])   . Post-operative state 12/10/2011    Past Surgical History:  Procedure Laterality Date  . ANKLE FRACTURE SURGERY Right 2015  . CARDIAC CATHETERIZATION N/A 06/24/2015   Procedure: Left Heart Cath and Coronary Angiography;  Surgeon: Belva Crome, MD;  Location: [facility];  Service: Cardiovascular;  Laterality: N/A;  . CARPAL TUNNEL RELEASE     bil  . CHOLECYSTECTOMY  11/24/2011   Procedure: LAPAROSCOPIC CHOLECYSTECTOMY WITH INTRAOPERATIVE CHOLANGIOGRAM;  Surgeon: Joyice Faster. Cornett, MD;  Location: [facility];  Service: General;  Laterality: N/A;  Laparoscopic Cholecystectomy with Cholangiogram  . COLONOSCOPY    . ECTOPIC PREGNANCY SURGERY  many yrs ago  . HARDWARE REMOVAL Right 07/15/2015   Procedure: RIGHT ANKLE HARDWARE REMOVAL, PLACEMENT OF STIMULAN ANTIBIOTIC BEADS AND PREVENA WOUND VAC.;  Surgeon: Meredith Pel, MD;  Location: [facility];  Service: Orthopedics;  Laterality: Right;  . TOOTH EXTRACTION N/A 12/03/2016   Procedure: DENTAL EXTRACTIONS with Alveoloplasty;  Surgeon: Diona Browner, DDS;  Location: [facility];  Service: Oral Surgery;  Laterality: N/A;     OB History    Gravida  9   Para      Term       Preterm      AB  5   Living  4     SAB  4   TAB      Ectopic  1   Multiple      Live Births  4            Home Medications    Prior to Admission medications   Medication Sig Start Date End Date Taking? Authorizing Provider  ACCU-CHEK FASTCLIX LANCETS MISC Use as directed to test blood sugar up to four times daily 05/22/18   Charlott Rakes, MD  ACCU-CHEK SOFTCLIX LANCETS lancets USE AS DIRECTED DAILY UP TO THREE TIMES DAILY 04/18/18   Antonietta Breach, [name]  albuterol (PROVENTIL HFA;VENTOLIN HFA) 108 (90 Base) MCG/ACT inhaler Inhale 2 puffs into the lungs every 4 (four) hours as needed for wheezing or shortness of breath. 09/12/17   Noemi Chapel, MD  Blood Glucose Monitoring Suppl (ACCU-CHEK GUIDE) w/Device KIT 1 each by Does not apply route 4 (four) times daily. 05/22/18   Charlott Rakes, MD  dicyclomine (BENTYL) 10 MG capsule Take 1 capsule (10 mg total) by mouth 3 (three) times daily before meals. 10/02/18   Charlott Rakes, MD  diphenoxylate-atropine (LOMOTIL) 2.5-0.025 MG tablet Take 1 tablet by mouth 4 (four) times daily as needed for diarrhea or loose stools. 10/18/18   Charlott Rakes, MD  doxycycline (VIBRAMYCIN) 100 MG capsule Take 1 capsule (100 mg total) by mouth 2 (two) times daily. One po bid x 7 days 01/14/19   Ripley Fraise, MD  esomeprazole (NEXIUM) 20 MG capsule Take 1 capsule (20 mg total) by mouth daily. 10/02/18   Charlott Rakes, MD  fenofibrate (TRICOR) 48 MG tablet Take 1 tablet (48 mg total) by mouth daily. 10/02/18   Charlott Rakes, MD  FLUoxetine (PROZAC) 40 MG capsule Take 1 capsule (40 mg total) by mouth at bedtime. 10/02/18   Charlott Rakes, MD  glimepiride (AMARYL) 4 MG tablet Take 2 tablets (8 mg total) by mouth daily with breakfast. 10/02/18   Charlott Rakes, MD  glucose blood (ACCU-CHEK GUIDE) test strip Use as instructed to test blood sugar up to four times daily 05/22/18   Charlott Rakes, MD  hydrOXYzine (ATARAX/VISTARIL) 10 MG tablet Take 1  tablet (10 mg total) by mouth 2 (two) times daily as needed. 10/02/18   Charlott Rakes, MD  Lancet Devices [facility]) lancets Use as instructed daily. 11/24/16   Charlott Rakes, MD  LANTUS SOLOSTAR 100 UNIT/ML Solostar Pen Inject 40 Units into the skin at bedtime. I 01/11/19   Charlott Rakes, MD  meclizine (ANTIVERT) 25 MG tablet Take 1 tablet (25 mg total) by mouth 3 (three) times daily as needed for dizziness. 10/31/18   Charlott Rakes, MD  metoprolol succinate (TOPROL-XL) 25 MG 24 hr tablet Take 1 tablet (25 mg total) by mouth daily.  10/02/18   Charlott Rakes, MD  orphenadrine (NORFLEX) 100 MG tablet Take 1 tablet (100 mg total) by mouth 2 (two) times daily. 05/04/18   Charlesetta Shanks, MD  pravastatin (PRAVACHOL) 40 MG tablet Take 1 tablet (40 mg total) by mouth daily. 01/11/19   Charlott Rakes, MD  SUMAtriptan (IMITREX) 50 MG tablet May repeat once in 2 hours if headache persists or recurs. 12/11/18   McDonald, Mia A, [name]  SURE COMFORT PEN NEEDLES 31G X 5 MM MISC USE ONCE A DAY 12/14/18   Charlott Rakes, MD  tiotropium (SPIRIVA) 18 MCG inhalation capsule Place 1 capsule (18 mcg total) into inhaler and inhale daily. 10/02/18   Charlott Rakes, MD  tiZANidine (ZANAFLEX) 4 MG tablet Take 1 tablet (4 mg total) by mouth every 8 (eight) hours as needed for muscle spasms. 12/25/18   Charlott Rakes, MD  triamcinolone cream (KENALOG) 0.1 % Apply 1 application topically 2 (two) times daily. 10/02/18   Charlott Rakes, MD    Family History Family History  Problem Relation Age of Onset  . Lung cancer Father   . Heart attack Father 51       CABG x4  . Breast cancer Sister        Survivor     Social History Social History   Tobacco Use  . Smoking status: Light Tobacco Smoker    Packs/day: 0.00    Years: 20.00    Pack years: 0.00    Types: Cigarettes, E-cigarettes    Start date: 09/19/2016  . Smokeless tobacco: Never Used  Substance Use Topics  . Alcohol use: No    Alcohol/week: 0.0  standard drinks  . Drug use: No     Allergies   Biaxin [clarithromycin], Clarithromycin, Lisinopril, Sulfa antibiotics, Chlorthalidone, Daptomycin, Latex, Amoxicillin-pot clavulanate, Aspirin, Cholestyramine, Dilaudid [hydromorphone hcl], and Lasix [furosemide]   Review of Systems Review of Systems  Constitutional: Negative for fever.  HENT: Positive for facial swelling. Negative for dental problem.   Eyes: Negative for visual disturbance.  Gastrointestinal: Negative for vomiting.  Skin: Positive for wound.  All other systems reviewed and are negative.    Physical Exam Updated Vital Signs BP (!) 151/80 (BP Location: Right Arm)   Pulse 84   Temp 98.6 F (37 C) (Oral)   Resp 19   LMP 07/24/2011   SpO2 100%   Physical Exam CONSTITUTIONAL: Well developed/well nourished HEAD: Normocephalic/atraumatic EYES: EOMI/PERRL, no proptosis ENMT: Mucous membranes moist, mild left facial swelling, see photo, no facial crepitus, no obvious gingival abscess NECK: supple no meningeal signs CV: S1/S2 noted, no murmurs/rubs/gallops noted LUNGS: Lungs are clear to auscultation bilaterally, no apparent distress ABDOMEN: soft, nontender  NEURO: Pt is awake/alert/appropriate, moves all extremitiesx4.  No facial droop.   EXTREMITIES: pulses normal/equal, full ROM SKIN: see photo  PSYCH: no abnormalities of mood noted, alert and oriented to situation    Patient gave verbal permission to utilize photo for medical documentation only The image was not stored on any personal device   ED Treatments / Results  Labs (all labs ordered are listed, but only abnormal results are displayed) Labs Reviewed - No data to display  EKG None  Radiology No results found.  Procedures Procedures Medications Ordered in ED Medications - No data to display   Initial Impression / Assessment and Plan / ED Course  I have reviewed the triage vital signs and the nursing notes.      Patient presents  with facial swelling, early abscess/cellulitis. She is well-appearing.  She is nontoxic.  She admits to not controlling her glucose.  Plan is to improve glucose control, will start on doxycycline. If no improvement in the next 7 to 10 days, follow-up with PCP for further evaluation.  She reports a long history of similar type lesions.  Final Clinical Impressions(s) / ED Diagnoses   Final diagnoses:  Facial swelling  Facial cellulitis    ED Discharge Orders         Ordered    doxycycline (VIBRAMYCIN) 100 MG capsule  2 times daily     01/14/19 0229           Ripley Fraise, MD 01/14/19 [phone]

## 2019-01-25 ENCOUNTER — Other Ambulatory Visit: Payer: Self-pay | Admitting: Family Medicine

## 2019-01-25 DIAGNOSIS — H8113 Benign paroxysmal vertigo, bilateral: Secondary | ICD-10-CM

## 2019-02-21 ENCOUNTER — Other Ambulatory Visit: Payer: Self-pay | Admitting: Family Medicine

## 2019-03-07 ENCOUNTER — Other Ambulatory Visit: Payer: Self-pay | Admitting: Family Medicine

## 2019-03-07 DIAGNOSIS — Z8719 Personal history of other diseases of the digestive system: Secondary | ICD-10-CM

## 2019-03-07 DIAGNOSIS — F419 Anxiety disorder, unspecified: Secondary | ICD-10-CM

## 2019-03-07 DIAGNOSIS — J410 Simple chronic bronchitis: Secondary | ICD-10-CM

## 2019-03-09 ENCOUNTER — Other Ambulatory Visit: Payer: Self-pay | Admitting: Family Medicine

## 2019-03-12 ENCOUNTER — Other Ambulatory Visit: Payer: Self-pay | Admitting: Family Medicine

## 2019-03-12 DIAGNOSIS — I10 Essential (primary) hypertension: Secondary | ICD-10-CM

## 2019-03-12 DIAGNOSIS — K580 Irritable bowel syndrome with diarrhea: Secondary | ICD-10-CM

## 2019-03-29 ENCOUNTER — Other Ambulatory Visit: Payer: Self-pay | Admitting: Family Medicine

## 2019-03-29 DIAGNOSIS — Z8719 Personal history of other diseases of the digestive system: Secondary | ICD-10-CM

## 2019-03-29 DIAGNOSIS — F419 Anxiety disorder, unspecified: Secondary | ICD-10-CM

## 2019-03-29 DIAGNOSIS — H8113 Benign paroxysmal vertigo, bilateral: Secondary | ICD-10-CM

## 2019-03-29 DIAGNOSIS — K580 Irritable bowel syndrome with diarrhea: Secondary | ICD-10-CM

## 2019-04-03 ENCOUNTER — Other Ambulatory Visit: Payer: Self-pay | Admitting: Family Medicine

## 2019-04-03 DIAGNOSIS — F419 Anxiety disorder, unspecified: Secondary | ICD-10-CM

## 2019-04-03 DIAGNOSIS — R21 Rash and other nonspecific skin eruption: Secondary | ICD-10-CM

## 2019-04-30 ENCOUNTER — Other Ambulatory Visit: Payer: Self-pay | Admitting: Family Medicine

## 2019-04-30 DIAGNOSIS — Z794 Long term (current) use of insulin: Secondary | ICD-10-CM

## 2019-04-30 DIAGNOSIS — E1165 Type 2 diabetes mellitus with hyperglycemia: Secondary | ICD-10-CM

## 2019-05-01 ENCOUNTER — Other Ambulatory Visit: Payer: Self-pay | Admitting: Family Medicine

## 2019-05-01 DIAGNOSIS — K580 Irritable bowel syndrome with diarrhea: Secondary | ICD-10-CM

## 2019-05-01 DIAGNOSIS — F419 Anxiety disorder, unspecified: Secondary | ICD-10-CM

## 2019-05-01 DIAGNOSIS — Z8719 Personal history of other diseases of the digestive system: Secondary | ICD-10-CM

## 2019-05-01 DIAGNOSIS — E1165 Type 2 diabetes mellitus with hyperglycemia: Secondary | ICD-10-CM

## 2019-05-01 DIAGNOSIS — J410 Simple chronic bronchitis: Secondary | ICD-10-CM

## 2019-05-01 DIAGNOSIS — Z794 Long term (current) use of insulin: Secondary | ICD-10-CM

## 2019-05-01 DIAGNOSIS — R21 Rash and other nonspecific skin eruption: Secondary | ICD-10-CM

## 2019-05-03 ENCOUNTER — Other Ambulatory Visit: Payer: Self-pay | Admitting: Family Medicine

## 2019-05-03 DIAGNOSIS — F419 Anxiety disorder, unspecified: Secondary | ICD-10-CM

## 2019-05-03 DIAGNOSIS — J410 Simple chronic bronchitis: Secondary | ICD-10-CM

## 2019-05-03 DIAGNOSIS — K580 Irritable bowel syndrome with diarrhea: Secondary | ICD-10-CM

## 2019-05-03 DIAGNOSIS — Z8719 Personal history of other diseases of the digestive system: Secondary | ICD-10-CM

## 2019-05-07 ENCOUNTER — Other Ambulatory Visit: Payer: Self-pay | Admitting: Family Medicine

## 2019-05-07 DIAGNOSIS — E1165 Type 2 diabetes mellitus with hyperglycemia: Secondary | ICD-10-CM

## 2019-05-09 ENCOUNTER — Other Ambulatory Visit: Payer: Self-pay | Admitting: Family Medicine

## 2019-05-09 DIAGNOSIS — E1165 Type 2 diabetes mellitus with hyperglycemia: Secondary | ICD-10-CM

## 2019-05-09 DIAGNOSIS — F419 Anxiety disorder, unspecified: Secondary | ICD-10-CM

## 2019-05-09 DIAGNOSIS — R21 Rash and other nonspecific skin eruption: Secondary | ICD-10-CM

## 2019-05-15 ENCOUNTER — Other Ambulatory Visit: Payer: Self-pay | Admitting: Family Medicine

## 2019-05-15 DIAGNOSIS — R21 Rash and other nonspecific skin eruption: Secondary | ICD-10-CM

## 2019-05-15 DIAGNOSIS — E1165 Type 2 diabetes mellitus with hyperglycemia: Secondary | ICD-10-CM

## 2019-05-15 DIAGNOSIS — F419 Anxiety disorder, unspecified: Secondary | ICD-10-CM

## 2019-05-31 ENCOUNTER — Other Ambulatory Visit: Payer: Self-pay

## 2019-05-31 ENCOUNTER — Encounter (HOSPITAL_COMMUNITY): Payer: Self-pay | Admitting: Pharmacy Technician

## 2019-05-31 ENCOUNTER — Emergency Department (HOSPITAL_COMMUNITY)
Admission: EM | Admit: 2019-05-31 | Discharge: 2019-05-31 | Disposition: A | Discharge status: Home / Self Care | Payer: Medicaid Other | Attending: Emergency Medicine | Admitting: Emergency Medicine

## 2019-05-31 DIAGNOSIS — L539 Erythematous condition, unspecified: Secondary | ICD-10-CM | POA: Diagnosis present

## 2019-05-31 DIAGNOSIS — J449 Chronic obstructive pulmonary disease, unspecified: Secondary | ICD-10-CM | POA: Diagnosis not present

## 2019-05-31 DIAGNOSIS — I5022 Chronic systolic (congestive) heart failure: Secondary | ICD-10-CM | POA: Insufficient documentation

## 2019-05-31 DIAGNOSIS — E119 Type 2 diabetes mellitus without complications: Secondary | ICD-10-CM | POA: Diagnosis not present

## 2019-05-31 DIAGNOSIS — Z720 Tobacco use: Secondary | ICD-10-CM | POA: Insufficient documentation

## 2019-05-31 DIAGNOSIS — Z91040 Latex allergy status: Secondary | ICD-10-CM | POA: Insufficient documentation

## 2019-05-31 DIAGNOSIS — I10 Essential (primary) hypertension: Secondary | ICD-10-CM | POA: Insufficient documentation

## 2019-05-31 DIAGNOSIS — L03312 Cellulitis of back [any part except buttock]: Secondary | ICD-10-CM | POA: Diagnosis not present

## 2019-05-31 DIAGNOSIS — Z794 Long term (current) use of insulin: Secondary | ICD-10-CM | POA: Insufficient documentation

## 2019-05-31 DIAGNOSIS — Z79899 Other long term (current) drug therapy: Secondary | ICD-10-CM | POA: Insufficient documentation

## 2019-05-31 LAB — CBG MONITORING, ED: Glucose-Capillary: 319 mg/dL — ABNORMAL HIGH (ref 70–99)

## 2019-05-31 MED ORDER — DOXYCYCLINE HYCLATE 100 MG PO CAPS: 100.0000 mg | ORAL_CAPSULE | Freq: Two times a day (BID) | ORAL | 0 refills | Status: AC

## 2019-05-31 MED ORDER — FLUCONAZOLE 150 MG PO TABS: 150.0000 mg | ORAL_TABLET | Freq: Every day | ORAL | 0 refills | Status: DC

## 2019-05-31 MED ORDER — DOXYCYCLINE HYCLATE 100 MG PO TABS
100.0000 mg | ORAL_TABLET | Freq: Once | ORAL | Status: AC
  Administered 2019-05-31: 17:00:00 100 mg via ORAL
  Filled 2019-05-31: qty 1

## 2019-05-31 NOTE — ED Notes (Signed)
Patient Alert and oriented to baseline. Stable and ambulatory to baseline. Patient verbalized understanding of the discharge instructions.  Patient belongings were taken by the patient.   

## 2019-05-31 NOTE — ED Triage Notes (Signed)
Pt reports sore to her R shoulder. States been dealing with it X2 weeks with worsening. Pt now endorses low grade fever, chills, nausea.

## 2019-05-31 NOTE — ED Provider Notes (Signed)
[facility] EMERGENCY DEPARTMENT Provider Note   CSN: [id number] Arrival date & time: 05/31/19  1527     History   Chief Complaint Chief Complaint  Patient presents with   Rash    HPI [name] is a 58 y.o. female with a past medical history of IDDM, hypertension, COPD, CHF presenting to the ED with a chief complaint of skin infection.  For the past 2 weeks been having redness and irritation to her left shoulder as well as induration.  She has been trying to get in touch with her PCP for the past 2 weeks she has had these "sores" in other parts of her body which usually improved with p.o. antibiotics.  However her PCP has not been responsive to her and ultimately told her today that she needed to report to the emergency department.  She tried putting a potato on the area yesterday to "make it come to a head" but states that it was unable to do so.  She has been putting a homeopathic ointment in the area with no improvement.  Reports onset of subjective fever, chills and nausea today which she states can happen "if these get really bad."  Notes a history of MRSA in the past.  Denies any shortness of breath, vomiting, abdominal pain, trauma to the area.     HPI  Past Medical History:  Diagnosis Date   Anxiety    Arthritis    Chronic systolic heart failure ([place])    EF 20-25% ECHO 05/2015   COPD (chronic obstructive pulmonary disease) ([place])    Depression    Diabetes mellitus without complication ([place])    Type II   Dysrhythmia    pt unsure of name of arrythmia - " my heart rate will drop all of a sudden" -no current treatment - atrial flutter   Fibrillation, atrial (HCC)    Gallstones    GERD (gastroesophageal reflux disease)    Headache(784.0)    migraines   Hypertension    Osteomyelitis ([place])    R ankle   Rash    arms    Patient Active Problem List   Diagnosis Date Noted   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/18/2016   Primary osteoarthritis  of both hands 10/06/2016   History of gastroesophageal reflux (GERD) 10/06/2016   History of COPD 10/06/2016   History of anxiety 10/06/2016   History of CHF (congestive heart failure) 10/06/2016   History of hypertension 10/06/2016   History of cardiac dysrhythmia 10/06/2016   Primary osteoarthritis of both feet 10/06/2016   Primary osteoarthritis of both knees 10/06/2016   History of infection/ ankle post operative  10/06/2016   Rheumatoid factor positive 10/06/2016   De Quervain's disease (radial styloid tenosynovitis) 06/09/2016   Tobacco abuse 05/23/2016   Hyperglycemia without ketosis    Bleeding hemorrhoids 11/03/2015   Acute blood loss anemia 11/03/2015   Hematochezia 11/01/2015   Back pain with sciatica 09/08/2015   Infection of bone of ankle ([place]) 13/24/4010   Chronic systolic CHF (congestive heart failure) ([place]) 06/24/2015   Chest pain 06/03/2015   Hypertension    Anxiety    Gallstones    GERD (gastroesophageal reflux disease)    COPD (chronic obstructive pulmonary disease) ([place])    Dysrhythmia    Rash    Depression    Diabetes mellitus without complication ([place])    Post-operative state 12/10/2011    Past Surgical History:  Procedure Laterality Date   ANKLE FRACTURE SURGERY Right 2015  CARDIAC CATHETERIZATION N/A 06/24/2015   Procedure: Left Heart Cath and Coronary Angiography;  Surgeon: Belva Crome, MD;  Location: [facility];  Service: Cardiovascular;  Laterality: N/A;   CARPAL TUNNEL RELEASE     bil   CHOLECYSTECTOMY  11/24/2011   Procedure: LAPAROSCOPIC CHOLECYSTECTOMY WITH INTRAOPERATIVE CHOLANGIOGRAM;  Surgeon: Joyice Faster. Cornett, MD;  [facility];  Service: General;  Laterality: N/A;  Laparoscopic Cholecystectomy with Cholangiogram   COLONOSCOPY     ECTOPIC PREGNANCY SURGERY  many yrs ago   HARDWARE REMOVAL Right 07/15/2015   Procedure: RIGHT ANKLE HARDWARE REMOVAL, PLACEMENT OF STIMULAN ANTIBIOTIC BEADS  AND PREVENA WOUND VAC.;  Surgeon: Meredith Pel, MD;  Location: [facility];  Service: Orthopedics;  Laterality: Right;   TOOTH EXTRACTION N/A 12/03/2016   Procedure: DENTAL EXTRACTIONS with Alveoloplasty;  Surgeon: Diona Browner, DDS;  Location: [facility];  Service: Oral Surgery;  Laterality: N/A;     OB History    Gravida  9   Para      Term      Preterm      AB  5   Living  4     SAB  4   TAB      Ectopic  1   Multiple      Live Births  4            Home Medications    Prior to Admission medications   Medication Sig Start Date End Date Taking? Authorizing Provider  ACCU-CHEK FASTCLIX LANCETS MISC Use as directed to test blood sugar up to four times daily 05/22/18   Charlott Rakes, MD  ACCU-CHEK SOFTCLIX LANCETS lancets USE AS DIRECTED DAILY UP TO THREE TIMES DAILY 04/18/18   Antonietta Breach, [name]  albuterol (PROVENTIL HFA;VENTOLIN HFA) 108 (90 Base) MCG/ACT inhaler Inhale 2 puffs into the lungs every 4 (four) hours as needed for wheezing or shortness of breath. 09/12/17   Noemi Chapel, MD  Blood Glucose Monitoring Suppl (ACCU-CHEK GUIDE) w/Device KIT 1 each by Does not apply route 4 (four) times daily. 05/22/18   Charlott Rakes, MD  dicyclomine (BENTYL) 10 MG capsule Take 1 capsule (10 mg total) by mouth 3 (three) times daily before meals. 10/02/18   Charlott Rakes, MD  diphenoxylate-atropine (LOMOTIL) 2.5-0.025 MG tablet Take 1 tablet by mouth 4 (four) times daily as needed for diarrhea or loose stools. 10/18/18   Charlott Rakes, MD  doxycycline (VIBRAMYCIN) 100 MG capsule Take 1 capsule (100 mg total) by mouth 2 (two) times daily for 7 days. 05/31/19 06/07/19  Rinnah Peppel, [name]  esomeprazole (NEXIUM) 20 MG capsule Take 1 capsule (20 mg total) by mouth daily. 10/02/18   Charlott Rakes, MD  fenofibrate (TRICOR) 48 MG tablet Take 1 tablet (48 mg total) by mouth daily. 10/02/18   Charlott Rakes, MD  fluconazole (DIFLUCAN) 150 MG tablet Take 1 tablet (150 mg total) by mouth  daily. 05/31/19   Iann Rodier, [name]  FLUoxetine (PROZAC) 40 MG capsule Take 1 capsule (40 mg total) by mouth at bedtime. 10/02/18   Charlott Rakes, MD  glimepiride (AMARYL) 4 MG tablet Take 2 tablets (8 mg total) by mouth daily with breakfast. 10/02/18   Charlott Rakes, MD  glucose blood (ACCU-CHEK GUIDE) test strip Use as instructed to test blood sugar up to four times daily 05/22/18   Charlott Rakes, MD  hydrOXYzine (ATARAX/VISTARIL) 10 MG tablet Take 1 tablet (10 mg total) by mouth 2 (two) times daily as needed. 10/02/18   [place],  Charlane Ferretti, MD  Lancet Devices [facility]) lancets Use as instructed daily. 11/24/16   Charlott Rakes, MD  LANTUS SOLOSTAR 100 UNIT/ML Solostar Pen Inject 40 Units into the skin at bedtime. I 01/11/19   Charlott Rakes, MD  meclizine (ANTIVERT) 25 MG tablet Take 1 tablet (25 mg total) by mouth 3 (three) times daily as needed for dizziness. 10/31/18   Charlott Rakes, MD  metoprolol succinate (TOPROL-XL) 25 MG 24 hr tablet Take 1 tablet (25 mg total) by mouth daily. Must have office visit for refills 03/13/19   Charlott Rakes, MD  orphenadrine (NORFLEX) 100 MG tablet Take 1 tablet (100 mg total) by mouth 2 (two) times daily. 05/04/18   Charlesetta Shanks, MD  pravastatin (PRAVACHOL) 40 MG tablet Take 1 tablet (40 mg total) by mouth daily. 01/11/19   Charlott Rakes, MD  SUMAtriptan (IMITREX) 50 MG tablet May repeat once in 2 hours if headache persists or recurs. 12/11/18   McDonald, Mia A, [name]  SURE COMFORT PEN NEEDLES 31G X 5 MM MISC USE ONCE A DAY 12/14/18   Charlott Rakes, MD  tiotropium (SPIRIVA) 18 MCG inhalation capsule Place 1 capsule (18 mcg total) into inhaler and inhale daily. 10/02/18   Charlott Rakes, MD  tiZANidine (ZANAFLEX) 4 MG tablet Take 1 tablet (4 mg total) by mouth every 8 (eight) hours as needed for muscle spasms. 12/25/18   Charlott Rakes, MD  triamcinolone cream (KENALOG) 0.1 % Apply 1 application topically 2 (two) times daily. 10/02/18   Charlott Rakes,  MD    Family History Family History  Problem Relation Age of Onset   Lung cancer Father    Heart attack Father 70       CABG x4   Breast cancer Sister        Survivor     Social History Social History   Tobacco Use   Smoking status: Light Tobacco Smoker    Packs/day: 0.00    Years: 20.00    Pack years: 0.00    Types: Cigarettes, E-cigarettes    Start date: 09/19/2016   Smokeless tobacco: Never Used  Substance Use Topics   Alcohol use: No    Alcohol/week: 0.0 standard drinks   Drug use: No     Allergies   Biaxin [clarithromycin], Clarithromycin, Lisinopril, Sulfa antibiotics, Chlorthalidone, Daptomycin, Latex, Amoxicillin-pot clavulanate, Aspirin, Cholestyramine, Dilaudid [hydromorphone hcl], and Lasix [furosemide]   Review of Systems Review of Systems  Constitutional: Negative for appetite change, chills and fever.  HENT: Negative for ear pain, rhinorrhea, sneezing and sore throat.   Eyes: Negative for photophobia and visual disturbance.  Respiratory: Negative for cough, chest tightness, shortness of breath and wheezing.   Cardiovascular: Negative for chest pain and palpitations.  Gastrointestinal: Negative for abdominal pain, blood in stool, constipation, diarrhea, nausea and vomiting.  Genitourinary: Negative for dysuria, hematuria and urgency.  Musculoskeletal: Negative for myalgias.  Skin: Positive for wound. Negative for rash.  Neurological: Negative for dizziness, weakness and light-headedness.     Physical Exam Updated Vital Signs BP (!) 120/94    Pulse 85    Temp 98.3 F (36.8 C) (Oral)    Resp 18    Ht '5\' 7"'$  (1.702 m)    Wt 71.2 kg    LMP 07/24/2011    SpO2 98%    BMI 24.59 kg/m   Physical Exam Vitals signs and nursing note reviewed.  Constitutional:      General: She is not in acute distress.    Appearance: She is well-developed.  HENT:     Head: Normocephalic and atraumatic.     Nose: Nose normal.  Eyes:     General: No scleral  icterus.       Right eye: No discharge.        Left eye: No discharge.     Conjunctiva/sclera: Conjunctivae normal.  Neck:     Musculoskeletal: Normal range of motion and neck supple.  Cardiovascular:     Rate and Rhythm: Normal rate and regular rhythm.     Heart sounds: Normal heart sounds. No murmur. No friction rub. No gallop.   Pulmonary:     Effort: Pulmonary effort is normal. No respiratory distress.     Breath sounds: Normal breath sounds.  Abdominal:     General: Bowel sounds are normal. There is no distension.     Palpations: Abdomen is soft.     Tenderness: There is no abdominal tenderness. There is no guarding.  Musculoskeletal: Normal range of motion.  Skin:    General: Skin is warm and dry.     Findings: Erythema present. No rash.     Comments: 3 cm area of erythema on the left upper back with minimal purulent drainage, palpable induration without fluctuance.  Neurological:     Mental Status: She is alert.     Motor: No abnormal muscle tone.     Coordination: Coordination normal.      ED Treatments / Results  Labs (all labs ordered are listed, but only abnormal results are displayed) Labs Reviewed  CBG MONITORING, ED - Abnormal; Notable for the following components:      Result Value   Glucose-Capillary 319 (*)    All other components within normal limits    EKG None  Radiology No results found.  Procedures Procedures (including critical care time)  Medications Ordered in ED Medications  doxycycline (VIBRA-TABS) tablet 100 mg (100 mg Oral Given 05/31/19 1709)     Initial Impression / Assessment and Plan / ED Course  I have reviewed the triage vital signs and the nursing notes.  Pertinent labs & imaging results that were available during my care of the patient were reviewed by me and considered in my medical decision making (see chart for details).        58 year old female presents to the ED with a chief complaint of cellulitis.  History of  cellulitis and MRSA in the past.  She has been trying to get in touch with her PCP for 2 weeks regarding area of erythema on her left upper back.  She reports subjective fever, chills and diarrhea today.  On exam there is evidence of cellulitis on the left upper back.  Minimal purulent drainage noted however there is no palpable fluctuance or fluid collection noted on ultrasound.  Her vital signs are within normal limits.  States that doxycycline has helped her in the past.  Blood sugar is slightly elevated here, patient states that this is typical for her.  No evidence of sepsis.  She is requesting treatment for yeast infection which he is prone to getting while on antibiotics.  Will give Diflucan, doxycycline and have her follow-up with PCP.    Patient is hemodynamically stable, in NAD, and able to ambulate in the ED. Evaluation does not show pathology that would require ongoing emergent intervention or inpatient treatment. I explained the diagnosis to the patient. Pain has been managed and has no complaints prior to discharge. Patient is comfortable with above plan and is stable for discharge at  this time. All questions were answered prior to disposition. Strict return precautions for returning to the ED were discussed. Encouraged follow up with PCP.   An After Visit Summary was printed and given to the patient.   Portions of this note were generated with [employer]. Dictation errors may occur despite best attempts at proofreading.  Final Clinical Impressions(s) / ED Diagnoses   Final diagnoses:  Cellulitis of back except buttock    ED Discharge Orders         Ordered    doxycycline (VIBRAMYCIN) 100 MG capsule  2 times daily     05/31/19 1645    fluconazole (DIFLUCAN) 150 MG tablet  Daily     05/31/19 1645           Delia Heady, [name] 05/31/19 1712    Valarie Merino, MD 06/04/19 1038

## 2019-05-31 NOTE — Discharge Instructions (Signed)
Take the antibiotics as directed.  You will need to take the entire course of antibiotics regardless of symptom improvement to prevent worsening or recurrence of your infection. Return to the ED if you start to have worsening symptoms, develop worsening swelling, drainage, red streaks, shortness of breath.

## 2019-06-06 ENCOUNTER — Other Ambulatory Visit: Payer: Self-pay | Admitting: Family Medicine

## 2019-06-06 DIAGNOSIS — E1165 Type 2 diabetes mellitus with hyperglycemia: Secondary | ICD-10-CM

## 2019-06-07 ENCOUNTER — Other Ambulatory Visit (HOSPITAL_COMMUNITY): Payer: Self-pay

## 2019-06-07 DIAGNOSIS — R21 Rash and other nonspecific skin eruption: Secondary | ICD-10-CM

## 2019-06-07 DIAGNOSIS — F419 Anxiety disorder, unspecified: Secondary | ICD-10-CM

## 2019-06-07 DIAGNOSIS — E119 Type 2 diabetes mellitus without complications: Secondary | ICD-10-CM

## 2019-06-07 DIAGNOSIS — Z794 Long term (current) use of insulin: Secondary | ICD-10-CM

## 2019-06-07 DIAGNOSIS — E1165 Type 2 diabetes mellitus with hyperglycemia: Secondary | ICD-10-CM

## 2019-06-19 ENCOUNTER — Telehealth: Payer: Self-pay | Admitting: Family Medicine

## 2019-06-19 NOTE — Telephone Encounter (Signed)
Pt called to request a refill for the following med LANTUS SOLOSTAR 100 UNIT/ML Solostar Pen  Please sent to [facility] - [place], [place] - [address]  [address], [place] [address]  Phone:  [phone] Fax:  [phone]  

## 2019-06-26 ENCOUNTER — Telehealth: Payer: Self-pay | Admitting: Family Medicine

## 2019-06-26 DIAGNOSIS — E1165 Type 2 diabetes mellitus with hyperglycemia: Secondary | ICD-10-CM

## 2019-06-26 DIAGNOSIS — Z794 Long term (current) use of insulin: Secondary | ICD-10-CM

## 2019-06-26 MED ORDER — LANTUS SOLOSTAR 100 UNIT/ML ~~LOC~~ SOPN: 40.0000 [IU] | PEN_INJECTOR | Freq: Every day | SUBCUTANEOUS | 0 refills | Status: DC

## 2019-06-26 NOTE — Telephone Encounter (Signed)
Rx sent for 1 month supply. 

## 2019-06-26 NOTE — Telephone Encounter (Signed)
Pt called to request a refill for the following med LANTUS SOLOSTAR 100 UNIT/ML Solostar Pen  Please sent to [place], [place]  [address], [place] [address]  Phone:  [phone] Fax:  [phone]

## 2019-07-17 ENCOUNTER — Ambulatory Visit: Payer: Medicaid Other | Admitting: Family Medicine

## 2019-07-30 ENCOUNTER — Other Ambulatory Visit: Payer: Self-pay

## 2019-07-30 ENCOUNTER — Ambulatory Visit: Payer: Medicaid Other | Attending: Family Medicine | Admitting: Family Medicine

## 2019-07-30 VITALS — BP 103/69 | HR 95 | Temp 98.2°F | Resp 18 | Ht 67.0 in | Wt 166.0 lb

## 2019-07-30 DIAGNOSIS — I10 Essential (primary) hypertension: Secondary | ICD-10-CM

## 2019-07-30 DIAGNOSIS — K580 Irritable bowel syndrome with diarrhea: Secondary | ICD-10-CM | POA: Insufficient documentation

## 2019-07-30 DIAGNOSIS — Z8249 Family history of ischemic heart disease and other diseases of the circulatory system: Secondary | ICD-10-CM | POA: Insufficient documentation

## 2019-07-30 DIAGNOSIS — K219 Gastro-esophageal reflux disease without esophagitis: Secondary | ICD-10-CM | POA: Insufficient documentation

## 2019-07-30 DIAGNOSIS — Z79899 Other long term (current) drug therapy: Secondary | ICD-10-CM | POA: Insufficient documentation

## 2019-07-30 DIAGNOSIS — M1990 Unspecified osteoarthritis, unspecified site: Secondary | ICD-10-CM | POA: Diagnosis not present

## 2019-07-30 DIAGNOSIS — R200 Anesthesia of skin: Secondary | ICD-10-CM | POA: Insufficient documentation

## 2019-07-30 DIAGNOSIS — J449 Chronic obstructive pulmonary disease, unspecified: Secondary | ICD-10-CM | POA: Diagnosis not present

## 2019-07-30 DIAGNOSIS — F329 Major depressive disorder, single episode, unspecified: Secondary | ICD-10-CM | POA: Insufficient documentation

## 2019-07-30 DIAGNOSIS — I5022 Chronic systolic (congestive) heart failure: Secondary | ICD-10-CM | POA: Insufficient documentation

## 2019-07-30 DIAGNOSIS — Z8719 Personal history of other diseases of the digestive system: Secondary | ICD-10-CM

## 2019-07-30 DIAGNOSIS — E1165 Type 2 diabetes mellitus with hyperglycemia: Secondary | ICD-10-CM | POA: Diagnosis present

## 2019-07-30 DIAGNOSIS — M549 Dorsalgia, unspecified: Secondary | ICD-10-CM | POA: Insufficient documentation

## 2019-07-30 DIAGNOSIS — H8113 Benign paroxysmal vertigo, bilateral: Secondary | ICD-10-CM | POA: Diagnosis not present

## 2019-07-30 DIAGNOSIS — Z7901 Long term (current) use of anticoagulants: Secondary | ICD-10-CM | POA: Insufficient documentation

## 2019-07-30 DIAGNOSIS — Z794 Long term (current) use of insulin: Secondary | ICD-10-CM | POA: Insufficient documentation

## 2019-07-30 DIAGNOSIS — F419 Anxiety disorder, unspecified: Secondary | ICD-10-CM | POA: Diagnosis not present

## 2019-07-30 DIAGNOSIS — J410 Simple chronic bronchitis: Secondary | ICD-10-CM

## 2019-07-30 DIAGNOSIS — I110 Hypertensive heart disease with heart failure: Secondary | ICD-10-CM | POA: Insufficient documentation

## 2019-07-30 DIAGNOSIS — M5430 Sciatica, unspecified side: Secondary | ICD-10-CM

## 2019-07-30 LAB — GLUCOSE, POCT (MANUAL RESULT ENTRY): POC Glucose: 326 mg/dl — AB (ref 70–99)

## 2019-07-30 LAB — POCT GLYCOSYLATED HEMOGLOBIN (HGB A1C): Hemoglobin A1C: 13.4 % — AB (ref 4.0–5.6)

## 2019-07-30 MED ORDER — MECLIZINE HCL 25 MG PO TABS: 25.0000 mg | ORAL_TABLET | Freq: Three times a day (TID) | ORAL | 1 refills | Status: DC | PRN

## 2019-07-30 MED ORDER — TIOTROPIUM BROMIDE MONOHYDRATE 18 MCG IN CAPS: 18.0000 ug | ORAL_CAPSULE | Freq: Every day | RESPIRATORY_TRACT | 1 refills | Status: DC

## 2019-07-30 MED ORDER — LANTUS SOLOSTAR 100 UNIT/ML ~~LOC~~ SOPN: PEN_INJECTOR | SUBCUTANEOUS | 6 refills | Status: DC

## 2019-07-30 MED ORDER — GABAPENTIN 300 MG PO CAPS: 300.0000 mg | ORAL_CAPSULE | Freq: Every day | ORAL | 3 refills | Status: DC

## 2019-07-30 MED ORDER — ATORVASTATIN CALCIUM 20 MG PO TABS: 20.0000 mg | ORAL_TABLET | Freq: Every day | ORAL | 1 refills | Status: DC

## 2019-07-30 MED ORDER — LANTUS SOLOSTAR 100 UNIT/ML ~~LOC~~ SOPN: PEN_INJECTOR | SUBCUTANEOUS | 0 refills | Status: DC

## 2019-07-30 MED ORDER — ESOMEPRAZOLE MAGNESIUM 20 MG PO CPDR: 20.0000 mg | DELAYED_RELEASE_CAPSULE | Freq: Every day | ORAL | 1 refills | Status: DC

## 2019-07-30 MED ORDER — FLUOXETINE HCL 40 MG PO CAPS: 40.0000 mg | ORAL_CAPSULE | Freq: Every day | ORAL | 1 refills | Status: DC

## 2019-07-30 MED ORDER — METOPROLOL SUCCINATE ER 25 MG PO TB24: 25.0000 mg | ORAL_TABLET | Freq: Every day | ORAL | 1 refills | Status: DC

## 2019-07-30 MED ORDER — HYDROXYZINE HCL 10 MG PO TABS: 10.0000 mg | ORAL_TABLET | Freq: Two times a day (BID) | ORAL | 1 refills | Status: DC | PRN

## 2019-07-30 MED ORDER — TIZANIDINE HCL 4 MG PO TABS: 4.0000 mg | ORAL_TABLET | Freq: Three times a day (TID) | ORAL | 3 refills | Status: DC | PRN

## 2019-07-30 MED ORDER — DICYCLOMINE HCL 10 MG PO CAPS: 10.0000 mg | ORAL_CAPSULE | Freq: Three times a day (TID) | ORAL | 1 refills | Status: DC

## 2019-07-30 MED ORDER — GLIMEPIRIDE 4 MG PO TABS: 8.0000 mg | ORAL_TABLET | Freq: Every day | ORAL | 1 refills | Status: DC

## 2019-07-30 NOTE — Patient Instructions (Signed)
Back Exercises These exercises help to make your trunk and back strong. They also help to keep the lower back flexible. Doing these exercises can help to prevent back pain or lessen existing pain.  If you have back pain, try to do these exercises 2-3 times each day or as told by your doctor.  As you get better, do the exercises once each day. Repeat the exercises more often as told by your doctor.  To stop back pain from coming back, do the exercises once each day, or as told by your doctor. Exercises Single knee to chest Do these steps 3-5 times in a row for each leg: 1. Lie on your back on a firm bed or the floor with your legs stretched out. 2. Bring one knee to your chest. 3. Grab your knee or thigh with both hands and hold them it in place. 4. Pull on your knee until you feel a gentle stretch in your lower back or buttocks. 5. Keep doing the stretch for 10-30 seconds. 6. Slowly let go of your leg and straighten it. Pelvic tilt Do these steps 5-10 times in a row: 1. Lie on your back on a firm bed or the floor with your legs stretched out. 2. Bend your knees so they point up to the ceiling. Your feet should be flat on the floor. 3. Tighten your lower belly (abdomen) muscles to press your lower back against the floor. This will make your tailbone point up to the ceiling instead of pointing down to your feet or the floor. 4. Stay in this position for 5-10 seconds while you gently tighten your muscles and breathe evenly. Cat-cow Do these steps until your lower back bends more easily: 1. Get on your hands and knees on a firm surface. Keep your hands under your shoulders, and keep your knees under your hips. You may put padding under your knees. 2. Let your head hang down toward your chest. Tighten (contract) the muscles in your belly. Point your tailbone toward the floor so your lower back becomes rounded like the back of a cat. 3. Stay in this position for 5 seconds. 4. Slowly lift your  head. Let the muscles of your belly relax. Point your tailbone up toward the ceiling so your back forms a sagging arch like the back of a cow. 5. Stay in this position for 5 seconds.  Press-ups Do these steps 5-10 times in a row: 1. Lie on your belly (face-down) on the floor. 2. Place your hands near your head, about shoulder-width apart. 3. While you keep your back relaxed and keep your hips on the floor, slowly straighten your arms to raise the top half of your body and lift your shoulders. Do not use your back muscles. You may change where you place your hands in order to make yourself more comfortable. 4. Stay in this position for 5 seconds. 5. Slowly return to lying flat on the floor.  Bridges Do these steps 10 times in a row: 1. Lie on your back on a firm surface. 2. Bend your knees so they point up to the ceiling. Your feet should be flat on the floor. Your arms should be flat at your sides, next to your body. 3. Tighten your butt muscles and lift your butt off the floor until your waist is almost as high as your knees. If you do not feel the muscles working in your butt and the back of your thighs, slide your feet 1-2 inches   farther away from your butt. 4. Stay in this position for 3-5 seconds. 5. Slowly lower your butt to the floor, and let your butt muscles relax. If this exercise is too easy, try doing it with your arms crossed over your chest. Belly crunches Do these steps 5-10 times in a row: 1. Lie on your back on a firm bed or the floor with your legs stretched out. 2. Bend your knees so they point up to the ceiling. Your feet should be flat on the floor. 3. Cross your arms over your chest. 4. Tip your chin a little bit toward your chest but do not bend your neck. 5. Tighten your belly muscles and slowly raise your chest just enough to lift your shoulder blades a tiny bit off of the floor. Avoid raising your body higher than that, because it can put too much stress on your low  back. 6. Slowly lower your chest and your head to the floor. Back lifts Do these steps 5-10 times in a row: 1. Lie on your belly (face-down) with your arms at your sides, and rest your forehead on the floor. 2. Tighten the muscles in your legs and your butt. 3. Slowly lift your chest off of the floor while you keep your hips on the floor. Keep the back of your head in line with the curve in your back. Look at the floor while you do this. 4. Stay in this position for 3-5 seconds. 5. Slowly lower your chest and your face to the floor. Contact a doctor if:  Your back pain gets a lot worse when you do an exercise.  Your back pain does not get better 2 hours after you exercise. If you have any of these problems, stop doing the exercises. Do not do them again unless your doctor says it is okay. Get help right away if:  You have sudden, very bad back pain. If this happens, stop doing the exercises. Do not do them again unless your doctor says it is okay. This information is not intended to replace advice given to you by your health care provider. Make sure you discuss any questions you have with your health care provider. Document Revised: 03/09/2018 Document Reviewed: 03/09/2018 Elsevier Patient Education  2020 [employer] Sciatica  Sciatica is pain, weakness, tingling, or loss of feeling (numbness) along the sciatic nerve. The sciatic nerve starts in the lower back and goes down the back of each leg. Sciatica usually goes away on its own or with treatment. Sometimes, sciatica may come back (recur). What are the causes? This condition happens when the sciatic nerve is pinched or has pressure put on it. This may be the result of:  A disk in between the bones of the spine bulging out too far (herniated disk).  Changes in the spinal disks that occur with aging.  A condition that affects a muscle in the butt.  Extra bone growth near the sciatic nerve.  A break (fracture) of the area  between your hip bones (pelvis).  Pregnancy.  Tumor. This is rare. What increases the risk? You are more likely to develop this condition if you:  Play sports that put pressure or stress on the spine.  Have poor strength and ease of movement (flexibility).  Have had a back injury in the past.  Have had back surgery.  Sit for long periods of time.  Do activities that involve bending or lifting over and over again.  Are very overweight (obese). What   are the signs or symptoms? Symptoms can vary from mild to very bad. They may include:  Any of these problems in the lower back, leg, hip, or butt: ? Mild tingling, loss of feeling, or dull aches. ? Burning sensations. ? Sharp pains.  Loss of feeling in the back of the calf or the sole of the foot.  Leg weakness.  Very bad back pain that makes it hard to move. These symptoms may get worse when you cough, sneeze, or laugh. They may also get worse when you sit or stand for long periods of time. How is this treated? This condition often gets better without any treatment. However, treatment may include:  Changing or cutting back on physical activity when you have pain.  Doing exercises and stretching.  Putting ice or heat on the affected area.  Medicines that help: ? To relieve pain and swelling. ? To relax your muscles.  Shots (injections) of medicines that help to relieve pain, irritation, and swelling.  Surgery. Follow these instructions at home: Medicines  Take over-the-counter and prescription medicines only as told by your doctor.  Ask your doctor if the medicine prescribed to you: ? Requires you to avoid driving or using heavy machinery. ? Can cause trouble pooping (constipation). You may need to take these steps to prevent or treat trouble pooping:  Drink enough fluids to keep your pee (urine) pale yellow.  Take over-the-counter or prescription medicines.  Eat foods that are high in fiber. These include  beans, whole grains, and fresh fruits and vegetables.  Limit foods that are high in fat and sugar. These include fried or sweet foods. Managing pain      If told, put ice on the affected area. ? Put ice in a plastic bag. ? Place a towel between your skin and the bag. ? Leave the ice on for 20 minutes, 2-3 times a day.  If told, put heat on the affected area. Use the heat source that your doctor tells you to use, such as a moist heat pack or a heating pad. ? Place a towel between your skin and the heat source. ? Leave the heat on for 20-30 minutes. ? Remove the heat if your skin turns bright red. This is very important if you are unable to feel pain, heat, or cold. You may have a greater risk of getting burned. Activity   Return to your normal activities as told by your doctor. Ask your doctor what activities are safe for you.  Avoid activities that make your symptoms worse.  Take short rests during the day. ? When you rest for a long time, do some physical activity or stretching between periods of rest. ? Avoid sitting for a long time without moving. Get up and move around at least one time each hour.  Exercise and stretch regularly, as told by your doctor.  Do not lift anything that is heavier than 10 lb (4.5 kg) while you have symptoms of sciatica. ? Avoid lifting heavy things even when you do not have symptoms. ? Avoid lifting heavy things over and over.  When you lift objects, always lift in a way that is safe for your body. To do this, you should: ? Bend your knees. ? Keep the object close to your body. ? Avoid twisting. General instructions  Stay at a healthy weight.  Wear comfortable shoes that support your feet. Avoid wearing high heels.  Avoid sleeping on a mattress that is too soft or too hard.   You might have less pain if you sleep on a mattress that is firm enough to support your back.  Keep all follow-up visits as told by your doctor. This is  important. Contact a doctor if:  You have pain that: ? Wakes you up when you are sleeping. ? Gets worse when you lie down. ? Is worse than the pain you have had in the past. ? Lasts longer than 4 weeks.  You lose weight without trying. Get help right away if:  You cannot control when you pee (urinate) or poop (have a bowel movement).  You have weakness in any of these areas and it gets worse: ? Lower back. ? The area between your hip bones. ? Butt. ? Legs.  You have redness or swelling of your back.  You have a burning feeling when you pee. Summary  Sciatica is pain, weakness, tingling, or loss of feeling (numbness) along the sciatic nerve.  This condition happens when the sciatic nerve is pinched or has pressure put on it.  Sciatica can cause pain, tingling, or loss of feeling (numbness) in the lower back, legs, hips, and butt.  Treatment often includes rest, exercise, medicines, and putting ice or heat on the affected area. This information is not intended to replace advice given to you by your health care provider. Make sure you discuss any questions you have with your health care provider. Document Revised: 07/03/2018 Document Reviewed: 07/03/2018 Elsevier Patient Education  2020 [employer]  

## 2019-07-30 NOTE — Progress Notes (Signed)
Subjective:  Patient ID: [name], female    DOB: [birth year]  Age: 59 y.o. MRN: [id number]  CC:  Chief Complaint  Patient presents with  . Diabetes Mellitus     HPI [name] is a 59 year old female with a history of type 2 diabetes mellitus (A1c 13.4), hypertension, CHF (EF 55-60%  2-D echo of 09/2016 which has improved from 25%-30% previously), anxiety, GERD who presents today for follow-up visit.   Over the last year she has had trouble lying on either left or right hip and this radiates down her lower extremities. It is is 8/10. Her fasting sugars at home have been 160 and random sugars have gotten up to 400. It is 326 today and her last meal was 6 hours ago.  She endorses compliance with 40 units of Lantus. Complains of numbness in her extremities; she didn't like Gabapentin in the past because it made her dizzy.  Denies presence of visual concerns.  With regards to her CHF she denies dyspnea, pedal edema, chest pain. Her anxiety is stable and vertigo is controlled on meclizine. She has no additional concerns today.  Past Medical History:  Diagnosis Date  . Anxiety   . Arthritis   . Chronic systolic heart failure (HCC)    EF 20-25% ECHO 05/2015  . COPD (chronic obstructive pulmonary disease) ([place])   . Depression   . Diabetes mellitus without complication ([place])    Type II  . Dysrhythmia    pt unsure of name of arrythmia - " my heart rate will drop all of a sudden" -no current treatment - atrial flutter  . Fibrillation, atrial ([place])   . Gallstones   . GERD (gastroesophageal reflux disease)   . Headache(784.0)    migraines  . Hypertension   . Osteomyelitis (HCC)    R ankle  . Rash    arms    Past Surgical History:  Procedure Laterality Date  . ANKLE FRACTURE SURGERY Right 2015  . CARDIAC CATHETERIZATION N/A 06/24/2015   Procedure: Left Heart Cath and Coronary Angiography;  Surgeon: Belva Crome, MD;  Location: [facility];  Service:  Cardiovascular;  Laterality: N/A;  . CARPAL TUNNEL RELEASE     bil  . CHOLECYSTECTOMY  11/24/2011   Procedure: LAPAROSCOPIC CHOLECYSTECTOMY WITH INTRAOPERATIVE CHOLANGIOGRAM;  Surgeon: Joyice Faster. Cornett, MD;  Location: [facility];  Service: General;  Laterality: N/A;  Laparoscopic Cholecystectomy with Cholangiogram  . COLONOSCOPY    . ECTOPIC PREGNANCY SURGERY  many yrs ago  . HARDWARE REMOVAL Right 07/15/2015   Procedure: RIGHT ANKLE HARDWARE REMOVAL, PLACEMENT OF STIMULAN ANTIBIOTIC BEADS AND PREVENA WOUND VAC.;  Surgeon: Meredith Pel, MD;  Location: [facility];  Service: Orthopedics;  Laterality: Right;  . TOOTH EXTRACTION N/A 12/03/2016   Procedure: DENTAL EXTRACTIONS with Alveoloplasty;  Surgeon: Diona Browner, DDS;  Location: [facility];  Service: Oral Surgery;  Laterality: N/A;    Family History  Problem Relation Age of Onset  . Lung cancer Father   . Heart attack Father 36       CABG x4  . Breast cancer Sister        Survivor     Allergies  Allergen Reactions  . Biaxin [Clarithromycin] Anaphylaxis and Swelling  . Clarithromycin Shortness Of Breath and Swelling  . Lisinopril Swelling and Cough    Lip edema  . Sulfa Antibiotics Anaphylaxis, Shortness Of Breath, Swelling and Hypertension  . Chlorthalidone Nausea And Vomiting and Other (See Comments)  Clammy, Tachycardia, Headache   . Daptomycin Nausea And Vomiting  . Latex Hives and Rash  . Amoxicillin-Pot Clavulanate Diarrhea    Has patient had a PCN reaction causing immediate rash, facial/tongue/throat swelling, SOB or lightheadedness with hypotension:No Has patient had a PCN reaction causing severe rash involving mucus membranes or skin necrosis:No Has patient had a PCN reaction that required hospitalization:No Has patient had a PCN reaction occurring within THE LAST 10 YEARS.  #  #  #  YES  #  #  #  If all of the above answers are "NO", then may proceed with Cephalosporin use.   . Aspirin Nausea And Vomiting and Rash    On  38m dosage  . Cholestyramine Nausea And Vomiting  . Dilaudid [Hydromorphone Hcl] Nausea And Vomiting  . Lasix [Furosemide] Nausea And Vomiting and Other (See Comments)    Headache     Outpatient Medications Prior to Visit  Medication Sig Dispense Refill  . ACCU-CHEK FASTCLIX LANCETS MISC Use as directed to test blood sugar up to four times daily 100 each 12  . ACCU-CHEK SOFTCLIX LANCETS lancets USE AS DIRECTED DAILY UP TO THREE TIMES DAILY 100 each 0  . albuterol (PROVENTIL HFA;VENTOLIN HFA) 108 (90 Base) MCG/ACT inhaler Inhale 2 puffs into the lungs every 4 (four) hours as needed for wheezing or shortness of breath. 1 Inhaler 3  . Blood Glucose Monitoring Suppl (ACCU-CHEK GUIDE) w/Device KIT 1 each by Does not apply route 4 (four) times daily. 1 kit 0  . dicyclomine (BENTYL) 10 MG capsule Take 1 capsule (10 mg total) by mouth 3 (three) times daily before meals. 270 capsule 1  . diphenoxylate-atropine (LOMOTIL) 2.5-0.025 MG tablet Take 1 tablet by mouth 4 (four) times daily as needed for diarrhea or loose stools. 60 tablet 1  . esomeprazole (NEXIUM) 20 MG capsule Take 1 capsule (20 mg total) by mouth daily. 90 capsule 1  . fenofibrate (TRICOR) 48 MG tablet Take 1 tablet (48 mg total) by mouth daily. 90 tablet 1  . FLUoxetine (PROZAC) 40 MG capsule Take 1 capsule (40 mg total) by mouth at bedtime. 90 capsule 1  . glimepiride (AMARYL) 4 MG tablet Take 2 tablets (8 mg total) by mouth daily with breakfast. 180 tablet 1  . glucose blood (ACCU-CHEK GUIDE) test strip Use as instructed to test blood sugar up to four times daily 100 each 12  . hydrOXYzine (ATARAX/VISTARIL) 10 MG tablet Take 1 tablet (10 mg total) by mouth 2 (two) times daily as needed. 180 tablet 1  . Insulin Glargine (LANTUS SOLOSTAR) 100 UNIT/ML Solostar Pen Inject 40 Units into the skin at bedtime. 15 mL 0  . Lancet Devices (ACCU-CHEK SOFTCLIX) lancets Use as instructed daily. 1 each 5  . meclizine (ANTIVERT) 25 MG tablet Take 1  tablet (25 mg total) by mouth 3 (three) times daily as needed for dizziness. 180 tablet 1  . metoprolol succinate (TOPROL-XL) 25 MG 24 hr tablet Take 1 tablet (25 mg total) by mouth daily. Must have office visit for refills 30 tablet 0  . orphenadrine (NORFLEX) 100 MG tablet Take 1 tablet (100 mg total) by mouth 2 (two) times daily. 30 tablet 0  . pravastatin (PRAVACHOL) 40 MG tablet Take 1 tablet (40 mg total) by mouth daily. 90 tablet 0  . SURE COMFORT PEN NEEDLES 31G X 5 MM MISC USE ONCE A DAY 100 each 5  . tiotropium (SPIRIVA) 18 MCG inhalation capsule Place 1 capsule (18 mcg total) into inhaler  and inhale daily. 90 capsule 1  . tiZANidine (ZANAFLEX) 4 MG tablet Take 1 tablet (4 mg total) by mouth every 8 (eight) hours as needed for muscle spasms. 90 tablet 1  . triamcinolone cream (KENALOG) 0.1 % Apply 1 application topically 2 (two) times daily. 30 g 0  . SUMAtriptan (IMITREX) 50 MG tablet May repeat once in 2 hours if headache persists or recurs. (Patient not taking: Reported on 07/30/2019) 10 tablet 0  . fluconazole (DIFLUCAN) 150 MG tablet Take 1 tablet (150 mg total) by mouth daily. 2 tablet 0   Facility-Administered Medications Prior to Visit  Medication Dose Route Frequency Provider Last Rate Last Admin  . regadenoson (LEXISCAN) injection SOLN 0.4 mg  0.4 mg Intravenous Once Dorothy Spark, MD      . technetium tetrofosmin (TC-MYOVIEW) injection 01.6 millicurie  01.0 millicurie Intravenous Once PRN Dorothy Spark, MD         ROS Review of Systems  Constitutional: Negative for activity change, appetite change and fatigue.  HENT: Negative for congestion, sinus pressure and sore throat.   Eyes: Negative for visual disturbance.  Respiratory: Negative for cough, chest tightness, shortness of breath and wheezing.   Cardiovascular: Negative for chest pain and palpitations.  Gastrointestinal: Negative for abdominal distention, abdominal pain and constipation.  Endocrine: Negative  for polydipsia.  Genitourinary: Negative for dysuria and frequency.  Musculoskeletal:       See HPI  Skin: Negative for rash.  Neurological: Negative for tremors, light-headedness and numbness.  Hematological: Does not bruise/bleed easily.  Psychiatric/Behavioral: Negative for agitation and behavioral problems.    Objective:  BP 103/69 (BP Location: Left Arm, Patient Position: Sitting, Cuff Size: Normal)   Pulse 95   Temp 98.2 F (36.8 C) (Oral)   Resp 18   Ht _0  (1.702 m)   Wt 166 lb (75.3 kg)   LMP 07/24/2011   SpO2 97%   BMI 26.00 kg/m   BP/Weight 07/30/2019 05/31/2019 9/32/3557  Systolic BP 322 025 427  Diastolic BP 69 94 80  Wt. (Lbs) 166 157 -  BMI 26 24.59 -      Physical Exam Constitutional:      Appearance: She is well-developed.  Neck:     Vascular: No JVD.  Cardiovascular:     Rate and Rhythm: Normal rate.     Heart sounds: Normal heart sounds. No murmur.  Pulmonary:     Effort: Pulmonary effort is normal.     Breath sounds: Normal breath sounds. No wheezing or rales.  Chest:     Chest wall: No tenderness.  Abdominal:     General: Bowel sounds are normal. There is no distension.     Palpations: Abdomen is soft. There is no mass.     Tenderness: There is no abdominal tenderness.  Musculoskeletal:        General: Normal range of motion.     Right lower leg: No edema.     Left lower leg: No edema.     Comments: Positive straight leg raise bilaterally Tenderness on palpation of bilateral hip  Neurological:     Mental Status: She is alert and oriented to person, place, and time.  Psychiatric:        Mood and Affect: Mood normal.     CMP Latest Ref Rng & Units 12/11/2018 10/02/2018 06/23/2018  Glucose 70 - 99 mg/dL 290(H) 318(H) -  BUN 6 - 20 mg/dL 5(L) 5(L) -  Creatinine 0.44 - 1.00 mg/dL 0.53 0.61 -  Sodium 135 - 145 mmol/L 137 141 -  Potassium 3.5 - 5.1 mmol/L 4.0 4.0 -  Chloride 98 - 111 mmol/L 103 103 -  CO2 22 - 32 mmol/L 24 23 -  Calcium  8.9 - 10.3 mg/dL 9.2 9.1 -  Total Protein 6.0 - 8.5 g/dL - 6.7 6.6  Total Bilirubin 0.0 - 1.2 mg/dL - <0.2 0.3  Alkaline Phos 39 - 117 IU/L - 118(H) 102  AST 0 - 40 IU/L - 17 25  ALT 0 - 32 IU/L - 21 29    Lipid Panel     Component Value Date/Time   CHOL 150 06/23/2018 1228   TRIG 178 (H) 06/23/2018 1228   HDL 31 (L) 06/23/2018 1228   CHOLHDL 4.8 (H) 06/23/2018 1228   CHOLHDL 5.1 (H) 07/05/2016 0947   VLDL UNABLE TO CALCULATE IF TRIGLYCERIDE OVER 400 mg/dL 05/24/2016 0415   LDLCALC 83 06/23/2018 1228    CBC    Component Value Date/Time   WBC 9.5 12/11/2018 2209   RBC 5.32 (H) 12/11/2018 2209   HGB 14.6 12/11/2018 2209   HCT 45.8 12/11/2018 2209   PLT 182 12/11/2018 2209   MCV 86.1 12/11/2018 2209   MCH 27.4 12/11/2018 2209   MCHC 31.9 12/11/2018 2209   RDW 13.0 12/11/2018 2209   LYMPHSABS 2.6 04/01/2016 2144   MONOABS 0.4 04/01/2016 2144   EOSABS 0.3 04/01/2016 2144   BASOSABS 0.0 04/01/2016 2144   Lab Results  Component Value Date   HGBA1C 13.4 (A) 07/30/2019     Assessment & Plan:    1. Type 2 diabetes mellitus with hyperglycemia, with long-term current use of insulin (HCC) Uncontrolled with A1c of 13.4; goal is less than 7 We will switch from 40 units of Lantus daily to 30 units in the morning and 20 units in the evening Gabapentin added for neuropathy-advised to take this at night due to dizziness and if unable to tolerate will consider switching to Lyrica Counseled on Diabetic diet, my plate method, 300 minutes of moderate intensity exercise/week Blood sugar logs with fasting goals of 80-120 mg/dl, random of less than 180 and in the event of sugars less than 60 mg/dl or greater than 400 mg/dl encouraged to notify the clinic. Advised on the need for annual eye exams, annual foot exams, Pneumonia vaccine. - HgB A1c - Glucose (CBG) - CMP14+EGFR; Future - Lipid panel; Future - Microalbumin / creatinine urine ratio; Future - gabapentin (NEURONTIN) 300 MG  capsule; Take 1 capsule (300 mg total) by mouth at bedtime.  Dispense: 60 capsule; Refill: 3 - atorvastatin (LIPITOR) 20 MG tablet; Take 1 tablet (20 mg total) by mouth daily.  Dispense: 90 tablet; Refill: 1 - glimepiride (AMARYL) 4 MG tablet; Take 2 tablets (8 mg total) by mouth daily with breakfast.  Dispense: 180 tablet; Refill: 1 - Insulin Glargine (LANTUS SOLOSTAR) 100 UNIT/ML Solostar Pen; Inject subcutaneously twice daily 30 units in the morning and 20 units in the evening.  Dispense: 30 mL; Refill: 6  2. History of gastroesophageal reflux (GERD) Stable - esomeprazole (NEXIUM) 20 MG capsule; Take 1 capsule (20 mg total) by mouth daily.  Dispense: 90 capsule; Refill: 1  3. Anxiety Controlled - hydrOXYzine (ATARAX/VISTARIL) 10 MG tablet; Take 1 tablet (10 mg total) by mouth 2 (two) times daily as needed.  Dispense: 180 tablet; Refill: 1 - FLUoxetine (PROZAC) 40 MG capsule; Take 1 capsule (40 mg total) by mouth at bedtime.  Dispense: 90 capsule; Refill: 1  4. Back pain  with sciatica Uncontrolled Tizanidine added to regimen and gabapentin has been added as well - tiZANidine (ZANAFLEX) 4 MG tablet; Take 1 tablet (4 mg total) by mouth every 8 (eight) hours as needed for muscle spasms.  Dispense: 90 tablet; Refill: 3  5. Simple chronic bronchitis (HCC) Stable - tiotropium (SPIRIVA) 18 MCG inhalation capsule; Place 1 capsule (18 mcg total) into inhaler and inhale daily.  Dispense: 90 capsule; Refill: 1  6. Essential hypertension Controlled - metoprolol succinate (TOPROL-XL) 25 MG 24 hr tablet; Take 1 tablet (25 mg total) by mouth daily.  Dispense: 90 tablet; Refill: 1  7. Irritable bowel syndrome with diarrhea Stable - dicyclomine (BENTYL) 10 MG capsule; Take 1 capsule (10 mg total) by mouth 3 (three) times daily before meals.  Dispense: 270 capsule; Refill: 1  8. Benign paroxysmal positional vertigo due to bilateral vestibular disorder Controlled - meclizine (ANTIVERT) 25 MG tablet;  Take 1 tablet (25 mg total) by mouth 3 (three) times daily as needed for dizziness.  Dispense: 180 tablet; Refill: 1  9. Chronic systolic CHF (congestive heart failure) (HCC) Euvolemic with EF of 55 to 60% Continue beta-blocker Not on ACE inhibitor due to allergy   Return in about 3 months (around 10/27/2019) for Chronic medical conditions-in person.  Charlott Rakes, MD, FAAFP. [facility] [place], [place]   07/30/2019, 4:25 PM

## 2019-07-31 ENCOUNTER — Encounter: Payer: Self-pay | Admitting: Family Medicine

## 2019-07-31 ENCOUNTER — Ambulatory Visit: Payer: Medicaid Other | Attending: Family Medicine

## 2019-07-31 ENCOUNTER — Other Ambulatory Visit: Payer: Self-pay

## 2019-07-31 DIAGNOSIS — Z794 Long term (current) use of insulin: Secondary | ICD-10-CM

## 2019-07-31 DIAGNOSIS — E1165 Type 2 diabetes mellitus with hyperglycemia: Secondary | ICD-10-CM

## 2019-08-01 LAB — CMP14+EGFR
ALT: 52 IU/L — ABNORMAL HIGH (ref 0–32)
AST: 46 IU/L — ABNORMAL HIGH (ref 0–40)
Albumin/Globulin Ratio: 1.6 (ref 1.2–2.2)
Albumin: 4.2 g/dL (ref 3.8–4.9)
Alkaline Phosphatase: 151 IU/L — ABNORMAL HIGH (ref 39–117)
BUN/Creatinine Ratio: 15 (ref 9–23)
BUN: 8 mg/dL (ref 6–24)
Bilirubin Total: 0.3 mg/dL (ref 0.0–1.2)
CO2: 21 mmol/L (ref 20–29)
Calcium: 9.1 mg/dL (ref 8.7–10.2)
Chloride: 100 mmol/L (ref 96–106)
Creatinine, Ser: 0.55 mg/dL — ABNORMAL LOW (ref 0.57–1.00)
GFR calc Af Amer: 120 mL/min/{1.73_m2} (ref >59)
GFR calc non Af Amer: 104 mL/min/{1.73_m2} (ref >59)
Globulin, Total: 2.6 g/dL (ref 1.5–4.5)
Glucose: 245 mg/dL — ABNORMAL HIGH (ref 65–99)
Potassium: 4.2 mmol/L (ref 3.5–5.2)
Sodium: 137 mmol/L (ref 134–144)
Total Protein: 6.8 g/dL (ref 6.0–8.5)

## 2019-08-01 LAB — LIPID PANEL
Chol/HDL Ratio: 5.6 ratio — ABNORMAL HIGH (ref 0.0–4.4)
Cholesterol, Total: 151 mg/dL (ref 100–199)
HDL: 27 mg/dL — ABNORMAL LOW (ref >39)
LDL Chol Calc (NIH): 77 mg/dL (ref 0–99)
Triglycerides: 287 mg/dL — ABNORMAL HIGH (ref 0–149)
VLDL Cholesterol Cal: 47 mg/dL — ABNORMAL HIGH (ref 5–40)

## 2019-08-01 LAB — MICROALBUMIN / CREATININE URINE RATIO
Creatinine, Urine: 227.9 mg/dL
Microalb/Creat Ratio: 13 mg/g creat (ref 0–29)
Microalbumin, Urine: 28.9 ug/mL

## 2019-08-05 ENCOUNTER — Other Ambulatory Visit: Payer: Self-pay

## 2019-08-05 ENCOUNTER — Encounter (HOSPITAL_COMMUNITY): Payer: Self-pay | Admitting: Emergency Medicine

## 2019-08-05 ENCOUNTER — Emergency Department (HOSPITAL_COMMUNITY)
Admission: EM | Admit: 2019-08-05 | Discharge: 2019-08-05 | Disposition: A | Discharge status: Home / Self Care | Payer: Medicaid Other | Attending: Emergency Medicine | Admitting: Emergency Medicine

## 2019-08-05 DIAGNOSIS — Z7984 Long term (current) use of oral hypoglycemic drugs: Secondary | ICD-10-CM | POA: Insufficient documentation

## 2019-08-05 DIAGNOSIS — L03312 Cellulitis of back [any part except buttock]: Secondary | ICD-10-CM | POA: Diagnosis not present

## 2019-08-05 DIAGNOSIS — F17210 Nicotine dependence, cigarettes, uncomplicated: Secondary | ICD-10-CM | POA: Insufficient documentation

## 2019-08-05 DIAGNOSIS — E119 Type 2 diabetes mellitus without complications: Secondary | ICD-10-CM | POA: Diagnosis not present

## 2019-08-05 DIAGNOSIS — R222 Localized swelling, mass and lump, trunk: Secondary | ICD-10-CM | POA: Diagnosis present

## 2019-08-05 LAB — POC URINE PREG, ED: Preg Test, Ur: NEGATIVE

## 2019-08-05 MED ORDER — LIDOCAINE HCL (PF) 1 % IJ SOLN
5.0000 mL | Freq: Once | INTRAMUSCULAR | Status: DC
  Filled 2019-08-05: qty 5

## 2019-08-05 MED ORDER — ACETAMINOPHEN 325 MG PO TABS
650.0000 mg | ORAL_TABLET | Freq: Once | ORAL | Status: AC
  Administered 2019-08-05: 650 mg via ORAL
  Filled 2019-08-05: qty 2

## 2019-08-05 MED ORDER — OXYCODONE HCL 5 MG PO TABS
5.0000 mg | ORAL_TABLET | Freq: Once | ORAL | Status: AC
  Administered 2019-08-05: 5 mg via ORAL
  Filled 2019-08-05: qty 1

## 2019-08-05 MED ORDER — DOXYCYCLINE HYCLATE 100 MG PO CAPS: 100.0000 mg | ORAL_CAPSULE | Freq: Two times a day (BID) | ORAL | 0 refills | Status: AC

## 2019-08-05 MED ORDER — DOXYCYCLINE HYCLATE 100 MG PO TABS
100.0000 mg | ORAL_TABLET | Freq: Once | ORAL | Status: AC
  Administered 2019-08-05: 100 mg via ORAL
  Filled 2019-08-05: qty 1

## 2019-08-05 NOTE — ED Triage Notes (Signed)
Pt in POV. Presents with abscess to L upper back, present since Thursday. States history of same.

## 2019-08-05 NOTE — ED Notes (Signed)
Discharge education provided; pt verbalized understanding. Pt reports her daughter will be providing ride home. Pt ambulated with a steady gait out of the ER into the waiting room  Until her daughter arrived.

## 2019-08-05 NOTE — Discharge Instructions (Addendum)
You have been diagnosed today with Cellulitis of the back.  At this time there does not appear to be the presence of an emergent medical condition, however there is always the potential for conditions to change. Please read and follow the below instructions.  Please return to the Emergency Department immediately for any new or worsening symptoms or if your symptoms do not improve within 2 days. Please be sure to follow up with your Primary Care Provider within one week regarding your visit today; please call their office to schedule an appointment even if you are feeling better for a follow-up visit. Please take the antibiotic doxycycline as prescribed for treatment of your infection.  Please use warm compresses on the area multiple times a day to help facilitate continued drainage.  Please drink plenty of water and get plenty of rest.  Please see your primary care provider in 2-3 days for recheck of the area to ensure improvement.  If you are unable to see your primary care provider in 2-3 days you may follow-up at an urgent care or return here to the emergency department for reevaluation of the area. You have been given a pain medication today.  Do not drive or perform any dangerous activities for the rest the day as it may make you drowsy.  Do not drink alcohol or take any sedating medication for the rest the day as this will worsen side effects.  Get help right away if: Your symptoms get worse. You feel very sleepy. You throw up (vomit) or have watery poop (diarrhea) for a long time. You see red streaks coming from the area. Your red area gets larger. Your red area turns dark in color. You have fever or chills You have any new/concerning or worsening of symptoms  Please read the additional information packets attached to your discharge summary.  Do not take your medicine if  develop an itchy rash, swelling in your mouth or lips, or difficulty breathing; call 911 and seek immediate emergency  medical attention if this occurs.  Note: Portions of this text may have been transcribed using voice recognition software. Every effort was made to ensure accuracy; however, inadvertent computerized transcription errors may still be present.

## 2019-08-05 NOTE — ED Provider Notes (Addendum)
[facility] EMERGENCY DEPARTMENT Provider Note   CSN: [id number] Arrival date & time: 08/05/19  1517     History Chief Complaint  Patient presents with  . Abscess    [name] is a 59 y.o. female history of CHF, COPD, diabetes, A. fib, GERD, hypertension.  Patient presents today for abscess of the left upper back.  She reports 3 days ago 08/02/2019 she noticed a mild pain and swelling of her left upper back.  She reports that she has a history of ingrown hairs and thought this was the same and attempted to pick at the area to remove the hair unsuccessfully.  She reports that the past 3 days of pain and swelling have continued to worsen.  She describes them moderate-severe in intensity throbbing pain constant radiating around her shoulder and upper back worsened with palpation no alleviating factors.  She reports intermittent scant drainage of the area.  She denies fever/chills, headache, neck pain, numbness/tingling, weakness, vomiting/diarrhea, abdominal pain, cough/shortness of breath, chest pain or any additional concerns.  HPI     Past Medical History:  Diagnosis Date  . Anxiety   . Arthritis   . Chronic systolic heart failure (HCC)    EF 20-25% ECHO 05/2015  . COPD (chronic obstructive pulmonary disease) ([place])   . Depression   . Diabetes mellitus without complication ([place])    Type II  . Dysrhythmia    pt unsure of name of arrythmia - " my heart rate will drop all of a sudden" -no current treatment - atrial flutter  . Fibrillation, atrial ([place])   . Gallstones   . GERD (gastroesophageal reflux disease)   . Headache(784.0)    migraines  . Hypertension   . Osteomyelitis (HCC)    R ankle  . Rash    arms    Patient Active Problem List   Diagnosis Date Noted  . 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 Primary osteoarthritis of both hands 10/06/2016  . History of gastroesophageal reflux (GERD) 10/06/2016  . History of COPD 10/06/2016  . History of  anxiety 10/06/2016  . History of CHF (congestive heart failure) 10/06/2016  . History of hypertension 10/06/2016  . History of cardiac dysrhythmia 10/06/2016  . Primary osteoarthritis of both feet 10/06/2016  . Primary osteoarthritis of both knees 10/06/2016  . History of infection/ ankle post operative  10/06/2016  . Rheumatoid factor positive 10/06/2016  . De Quervain's disease (radial styloid tenosynovitis) 06/09/2016  . Tobacco abuse 05/23/2016  . Hyperglycemia without ketosis   . Bleeding hemorrhoids 11/03/2015  . Acute blood loss anemia 11/03/2015  . Hematochezia 11/01/2015  . Back pain with sciatica 09/08/2015  . Infection of bone of ankle ([place]) 07/15/2015  . Chronic systolic CHF (congestive heart failure) ([place]) 06/24/2015  . Chest pain 06/03/2015  . Hypertension   . Anxiety   . Gallstones   . GERD (gastroesophageal reflux disease)   . COPD (chronic obstructive pulmonary disease) ([place])   . Dysrhythmia   . Rash   . Depression   . Diabetes mellitus without complication ([place])   . Post-operative state 12/10/2011    Past Surgical History:  Procedure Laterality Date  . ANKLE FRACTURE SURGERY Right 2015  . CARDIAC CATHETERIZATION N/A 06/24/2015   Procedure: Left Heart Cath and Coronary Angiography;  Surgeon: Belva Crome, MD;  Location: [facility];  Service: Cardiovascular;  Laterality: N/A;  . CARPAL TUNNEL RELEASE     bil  . CHOLECYSTECTOMY  11/24/2011  Procedure: LAPAROSCOPIC CHOLECYSTECTOMY WITH INTRAOPERATIVE CHOLANGIOGRAM;  Surgeon: Joyice Faster. Cornett, MD;  Location: [facility];  Service: General;  Laterality: N/A;  Laparoscopic Cholecystectomy with Cholangiogram  . COLONOSCOPY    . ECTOPIC PREGNANCY SURGERY  many yrs ago  . HARDWARE REMOVAL Right 07/15/2015   Procedure: RIGHT ANKLE HARDWARE REMOVAL, PLACEMENT OF STIMULAN ANTIBIOTIC BEADS AND PREVENA WOUND VAC.;  Surgeon: Meredith Pel, MD;  Location: [facility];  Service: Orthopedics;  Laterality: Right;  . TOOTH  EXTRACTION N/A 12/03/2016   Procedure: DENTAL EXTRACTIONS with Alveoloplasty;  Surgeon: Diona Browner, DDS;  Location: [facility];  Service: Oral Surgery;  Laterality: N/A;     OB History    Gravida  9   Para      Term      Preterm      AB  5   Living  4     SAB  4   TAB      Ectopic  1   Multiple      Live Births  4           Family History  Problem Relation Age of Onset  . Lung cancer Father   . Heart attack Father 49       CABG x4  . Breast cancer Sister        Survivor     Social History   Tobacco Use  . Smoking status: Light Tobacco Smoker    Packs/day: 0.00    Years: 20.00    Pack years: 0.00    Types: Cigarettes, E-cigarettes    Start date: 09/19/2016  . Smokeless tobacco: Never Used  Substance Use Topics  . Alcohol use: No    Alcohol/week: 0.0 standard drinks  . Drug use: No    Home Medications Prior to Admission medications   Medication Sig Start Date End Date Taking? Authorizing Provider  ACCU-CHEK FASTCLIX LANCETS MISC Use as directed to test blood sugar up to four times daily 05/22/18   Charlott Rakes, MD  ACCU-CHEK SOFTCLIX LANCETS lancets USE AS DIRECTED DAILY UP TO THREE TIMES DAILY 04/18/18   Antonietta Breach, [name]  albuterol (PROVENTIL HFA;VENTOLIN HFA) 108 (90 Base) MCG/ACT inhaler Inhale 2 puffs into the lungs every 4 (four) hours as needed for wheezing or shortness of breath. 09/12/17   Noemi Chapel, MD  atorvastatin (LIPITOR) 20 MG tablet Take 1 tablet (20 mg total) by mouth daily. 07/30/19   Charlott Rakes, MD  Blood Glucose Monitoring Suppl (ACCU-CHEK GUIDE) w/Device KIT 1 each by Does not apply route 4 (four) times daily. 05/22/18   Charlott Rakes, MD  dicyclomine (BENTYL) 10 MG capsule Take 1 capsule (10 mg total) by mouth 3 (three) times daily before meals. 07/30/19   Charlott Rakes, MD  diphenoxylate-atropine (LOMOTIL) 2.5-0.025 MG tablet Take 1 tablet by mouth 4 (four) times daily as needed for diarrhea or loose stools. 10/18/18    Charlott Rakes, MD  doxycycline (VIBRAMYCIN) 100 MG capsule Take 1 capsule (100 mg total) by mouth 2 (two) times daily for 7 days. 08/05/19 08/12/19  Nuala Alpha A, [name]  esomeprazole (NEXIUM) 20 MG capsule Take 1 capsule (20 mg total) by mouth daily. 07/30/19   Charlott Rakes, MD  fenofibrate (TRICOR) 48 MG tablet Take 1 tablet (48 mg total) by mouth daily. 10/02/18   Charlott Rakes, MD  FLUoxetine (PROZAC) 40 MG capsule Take 1 capsule (40 mg total) by mouth at bedtime. 07/30/19   Charlott Rakes, MD  gabapentin (NEURONTIN) 300 MG capsule Take  1 capsule (300 mg total) by mouth at bedtime. 07/30/19   Charlott Rakes, MD  glimepiride (AMARYL) 4 MG tablet Take 2 tablets (8 mg total) by mouth daily with breakfast. 07/30/19   Charlott Rakes, MD  glucose blood (ACCU-CHEK GUIDE) test strip Use as instructed to test blood sugar up to four times daily 05/22/18   Charlott Rakes, MD  hydrOXYzine (ATARAX/VISTARIL) 10 MG tablet Take 1 tablet (10 mg total) by mouth 2 (two) times daily as needed. 07/30/19   Charlott Rakes, MD  Insulin Glargine (LANTUS SOLOSTAR) 100 UNIT/ML Solostar Pen Inject subcutaneously twice daily 30 units in the morning and 20 units in the evening. 07/30/19   Charlott Rakes, MD  Lancet Devices [facility]) lancets Use as instructed daily. 11/24/16   Charlott Rakes, MD  meclizine (ANTIVERT) 25 MG tablet Take 1 tablet (25 mg total) by mouth 3 (three) times daily as needed for dizziness. 07/30/19   Charlott Rakes, MD  metoprolol succinate (TOPROL-XL) 25 MG 24 hr tablet Take 1 tablet (25 mg total) by mouth daily. 07/30/19   Charlott Rakes, MD  orphenadrine (NORFLEX) 100 MG tablet Take 1 tablet (100 mg total) by mouth 2 (two) times daily. 05/04/18   Charlesetta Shanks, MD  SUMAtriptan (IMITREX) 50 MG tablet May repeat once in 2 hours if headache persists or recurs. Patient not taking: Reported on 07/30/2019 12/11/18   McDonald, Mia A, [name]  SURE COMFORT PEN NEEDLES 31G X 5 MM MISC USE ONCE A DAY  12/14/18   Charlott Rakes, MD  tiotropium (SPIRIVA) 18 MCG inhalation capsule Place 1 capsule (18 mcg total) into inhaler and inhale daily. 07/30/19   Charlott Rakes, MD  tiZANidine (ZANAFLEX) 4 MG tablet Take 1 tablet (4 mg total) by mouth every 8 (eight) hours as needed for muscle spasms. 07/30/19   Charlott Rakes, MD  triamcinolone cream (KENALOG) 0.1 % Apply 1 application topically 2 (two) times daily. 10/02/18   Charlott Rakes, MD    Allergies    Biaxin [clarithromycin], Clarithromycin, Lisinopril, Sulfa antibiotics, Chlorthalidone, Daptomycin, Latex, Amoxicillin-pot clavulanate, Aspirin, Cholestyramine, Dilaudid [hydromorphone hcl], and Lasix [furosemide]  Review of Systems   Review of Systems Ten systems are reviewed and are negative for acute change except as noted in the HPI  Physical Exam Updated Vital Signs BP (!) 141/81   Pulse 97   Temp 98.9 F (37.2 C) (Oral)   Resp 16   Ht '5\' 7"'$  (1.702 m)   Wt 74.8 kg   LMP 07/24/2011   SpO2 96%   BMI 25.84 kg/m   Physical Exam Constitutional:      General: She is not in acute distress.    Appearance: Normal appearance. She is well-developed. She is not ill-appearing or diaphoretic.  HENT:     Head: Normocephalic and atraumatic.     Right Ear: External ear normal.     Left Ear: External ear normal.     Nose: Nose normal.  Eyes:     General: Vision grossly intact. Gaze aligned appropriately.     Pupils: Pupils are equal, round, and reactive to light.  Neck:     Trachea: Trachea and phonation normal. No tracheal deviation.  Pulmonary:     Effort: Pulmonary effort is normal. No respiratory distress.  Abdominal:     General: There is no distension.     Palpations: Abdomen is soft.     Tenderness: There is no abdominal tenderness. There is no guarding or rebound.  Musculoskeletal:  General: Normal range of motion.     Cervical back: Normal range of motion.  Skin:    General: Skin is warm and dry.          Comments: 1  cm diameter area of induration to the left upper back.  Appears to have been recently draining and has scabbed over centrally.  Moderate amount of surrounding cellulitis.  See picture below.  Neurological:     Mental Status: She is alert.     GCS: GCS eye subscore is 4. GCS verbal subscore is 5. GCS motor subscore is 6.     Comments: Speech is clear and goal oriented, follows commands Major Cranial nerves without deficit, no facial droop Moves extremities without ataxia, coordination intact  Psychiatric:        Behavior: Behavior normal.       ED Results / Procedures / Treatments   Labs (all labs ordered are listed, but only abnormal results are displayed) Labs Reviewed  POC URINE PREG, ED    EKG None  Radiology No results found.  Procedures Ultrasound ED Soft Tissue  Date/Time: 08/05/2019 6:55 PM Performed by: Deliah Boston, [name] Authorized by: Deliah Boston, [name]   Procedure details:    Indications: localization of abscess and evaluate for cellulitis     Transverse view:  Visualized   Longitudinal view:  Visualized   Images: archived   Location:    Location: upper back     Side:  Left Findings:     no abscess present    cellulitis present    no foreign body present   (including critical care time)  Medications Ordered in ED Medications  lidocaine (PF) (XYLOCAINE) 1 % injection 5 mL (has no administration in time range)  acetaminophen (TYLENOL) tablet 650 mg (650 mg Oral Given 08/05/19 1702)  oxyCODONE (Oxy IR/ROXICODONE) immediate release tablet 5 mg (5 mg Oral Given 08/05/19 1921)  doxycycline (VIBRA-TABS) tablet 100 mg (100 mg Oral Given 08/05/19 1921)    ED Course  I have reviewed the triage vital signs and the nursing notes.  Pertinent labs & imaging results that were available during my care of the patient were reviewed by me and considered in my medical decision making (see chart for details).    MDM Rules/Calculators/A&P                        59 year old female presented today for 3-day history of pain swelling and erythema to her left upper back.  She had picked at the area prior to arrival and had scant drainage.  Physical examination today was concerning for developing abscess of the area it was indurated but no fluctuance was felt.  There was a moderate amount of surrounding cellulitis.  Soft tissue ultrasound was performed as above, there is no evidence of a fluid collection that would be amendable to incision and drainage.  Discussed with patient and she is in agreement she wishes to try p.o. antibiotics for infection and will continue home compresses and will follow up in 2-3 days for recheck of the area.  She is aware that a abscess may be developing and she may need incision and drainage in the future.  We will start patient on doxycycline 100 mg twice daily as she reports she has taken this medication in the past with improvement of a similar infection in December of last year.  On reexamination she is well-appearing and in no acute distress has no  questions or complaints.  She is mildly tachycardic on arrival which have improved on my examination, suspect this to be due to her pain.  She is not appear toxic or septic.  No indication for blood work or further work-up in the emergency department today.  Her daughter is coming to drive her home today, she states understanding that she has been given a pain medication today and she is not to drive or perform any dangerous activities for the rest the day.  She denies any allergy to hydrocodone and reports she is taking in the past without reaction.  Additionally on review of PDMP patient has received Percocet prescriptions in the past in 2019 and denied reaction to those.  At this time there does not appear to be any evidence of an acute emergency medical condition and the patient appears stable for discharge with appropriate outpatient follow up. Diagnosis was discussed with patient who  verbalizes understanding of care plan and is agreeable to discharge. I have discussed return precautions with patient who verbalizes understanding of return precautions. Patient encouraged to follow-up with their PCP. All questions answered. Patient has been discharged in good condition.  Note: Portions of this report may have been transcribed using voice recognition software. Every effort was made to ensure accuracy; however, inadvertent computerized transcription errors may still be present. Final Clinical Impression(s) / ED Diagnoses Final diagnoses:  Cellulitis of back except buttock    Rx / DC Orders ED Discharge Orders         Ordered    doxycycline (VIBRAMYCIN) 100 MG capsule  2 times daily     08/05/19 1901           Gari Crown 08/05/19 1903    Deliah Boston, [name] 08/05/19 1936    Drenda Freeze, MD 08/05/19 [phone]

## 2019-08-08 NOTE — Progress Notes (Deleted)
Cardiology Office Note  Date: 08/08/2019   ID: [name], DOB 01-Oct-1960, MRN [id number]  PCP:  Charlott Rakes, MD  Cardiologist:  No primary care provider on file. Electrophysiologist:  None   No chief complaint on file.   History of Present Illness: [name] is a 59 y.o. female Hx of IDDM, HLD (mixed), HTN, Tobacco abuse, HFrEF EF 25-30% 2016. Cath NML CORs, NICM, LBBB. Echo 09/2016 EF 55-60%. Feb 2019 CP/ Palp. NEG stress, Monitor no arrhythmias, TRIG 300, Started fenofibrate. Non complaint with Insulin therapy.   Recent labs. LFTS ELEVATED, TRIGS, GLUCOSE A1C  Past Medical History:  Diagnosis Date  . Anxiety   . Arthritis   . Chronic systolic heart failure (HCC)    EF 20-25% ECHO 05/2015  . COPD (chronic obstructive pulmonary disease) ([place])   . Depression   . Diabetes mellitus without complication ([place])    Type II  . Dysrhythmia    pt unsure of name of arrythmia - " my heart rate will drop all of a sudden" -no current treatment - atrial flutter  . Fibrillation, atrial ([place])   . Gallstones   . GERD (gastroesophageal reflux disease)   . Headache(784.0)    migraines  . Hypertension   . Osteomyelitis (HCC)    R ankle  . Rash    arms    Past Surgical History:  Procedure Laterality Date  . ANKLE FRACTURE SURGERY Right 2015  . CARDIAC CATHETERIZATION N/A 06/24/2015   Procedure: Left Heart Cath and Coronary Angiography;  Surgeon: Belva Crome, MD;  Location: [facility];  Service: Cardiovascular;  Laterality: N/A;  . CARPAL TUNNEL RELEASE     bil  . CHOLECYSTECTOMY  11/24/2011   Procedure: LAPAROSCOPIC CHOLECYSTECTOMY WITH INTRAOPERATIVE CHOLANGIOGRAM;  Surgeon: Joyice Faster. Cornett, MD;  Location: [facility];  Service: General;  Laterality: N/A;  Laparoscopic Cholecystectomy with Cholangiogram  . COLONOSCOPY    . ECTOPIC PREGNANCY SURGERY  many yrs ago  . HARDWARE REMOVAL Right 07/15/2015   Procedure: RIGHT ANKLE HARDWARE REMOVAL, PLACEMENT OF  STIMULAN ANTIBIOTIC BEADS AND PREVENA WOUND VAC.;  Surgeon: Meredith Pel, MD;  Location: [place];  Service: Orthopedics;  Laterality: Right;  . TOOTH EXTRACTION N/A 12/03/2016   Procedure: DENTAL EXTRACTIONS with Alveoloplasty;  Surgeon: Diona Browner, DDS;  Location: [facility];  Service: Oral Surgery;  Laterality: N/A;    Current Outpatient Medications  Medication Sig Dispense Refill  . ACCU-CHEK FASTCLIX LANCETS MISC Use as directed to test blood sugar up to four times daily 100 each 12  . ACCU-CHEK SOFTCLIX LANCETS lancets USE AS DIRECTED DAILY UP TO THREE TIMES DAILY 100 each 0  . albuterol (PROVENTIL HFA;VENTOLIN HFA) 108 (90 Base) MCG/ACT inhaler Inhale 2 puffs into the lungs every 4 (four) hours as needed for wheezing or shortness of breath. 1 Inhaler 3  . atorvastatin (LIPITOR) 20 MG tablet Take 1 tablet (20 mg total) by mouth daily. 90 tablet 1  . Blood Glucose Monitoring Suppl (ACCU-CHEK GUIDE) w/Device KIT 1 each by Does not apply route 4 (four) times daily. 1 kit 0  . dicyclomine (BENTYL) 10 MG capsule Take 1 capsule (10 mg total) by mouth 3 (three) times daily before meals. 270 capsule 1  . diphenoxylate-atropine (LOMOTIL) 2.5-0.025 MG tablet Take 1 tablet by mouth 4 (four) times daily as needed for diarrhea or loose stools. 60 tablet 1  . doxycycline (VIBRAMYCIN) 100 MG capsule Take 1 capsule (100 mg total) by mouth 2 (two)  times daily for 7 days. 14 capsule 0  . esomeprazole (NEXIUM) 20 MG capsule Take 1 capsule (20 mg total) by mouth daily. 90 capsule 1  . fenofibrate (TRICOR) 48 MG tablet Take 1 tablet (48 mg total) by mouth daily. 90 tablet 1  . FLUoxetine (PROZAC) 40 MG capsule Take 1 capsule (40 mg total) by mouth at bedtime. 90 capsule 1  . gabapentin (NEURONTIN) 300 MG capsule Take 1 capsule (300 mg total) by mouth at bedtime. 60 capsule 3  . glimepiride (AMARYL) 4 MG tablet Take 2 tablets (8 mg total) by mouth daily with breakfast. 180 tablet 1  . glucose blood (ACCU-CHEK  GUIDE) test strip Use as instructed to test blood sugar up to four times daily 100 each 12  . hydrOXYzine (ATARAX/VISTARIL) 10 MG tablet Take 1 tablet (10 mg total) by mouth 2 (two) times daily as needed. 180 tablet 1  . Insulin Glargine (LANTUS SOLOSTAR) 100 UNIT/ML Solostar Pen Inject subcutaneously twice daily 30 units in the morning and 20 units in the evening. 30 mL 6  . Lancet Devices (ACCU-CHEK SOFTCLIX) lancets Use as instructed daily. 1 each 5  . meclizine (ANTIVERT) 25 MG tablet Take 1 tablet (25 mg total) by mouth 3 (three) times daily as needed for dizziness. 180 tablet 1  . metoprolol succinate (TOPROL-XL) 25 MG 24 hr tablet Take 1 tablet (25 mg total) by mouth daily. 90 tablet 1  . orphenadrine (NORFLEX) 100 MG tablet Take 1 tablet (100 mg total) by mouth 2 (two) times daily. 30 tablet 0  . SUMAtriptan (IMITREX) 50 MG tablet May repeat once in 2 hours if headache persists or recurs. (Patient not taking: Reported on 07/30/2019) 10 tablet 0  . SURE COMFORT PEN NEEDLES 31G X 5 MM MISC USE ONCE A DAY 100 each 5  . tiotropium (SPIRIVA) 18 MCG inhalation capsule Place 1 capsule (18 mcg total) into inhaler and inhale daily. 90 capsule 1  . tiZANidine (ZANAFLEX) 4 MG tablet Take 1 tablet (4 mg total) by mouth every 8 (eight) hours as needed for muscle spasms. 90 tablet 3  . triamcinolone cream (KENALOG) 0.1 % Apply 1 application topically 2 (two) times daily. 30 g 0   No current facility-administered medications for this visit.   Facility-Administered Medications Ordered in Other Visits  Medication Dose Route Frequency Provider Last Rate Last Admin  . regadenoson (LEXISCAN) injection SOLN 0.4 mg  0.4 mg Intravenous Once Dorothy Spark, MD      . technetium tetrofosmin (TC-MYOVIEW) injection 84.1 millicurie  32.4 millicurie Intravenous Once PRN Dorothy Spark, MD       Allergies:  Biaxin [clarithromycin], Clarithromycin, Lisinopril, Sulfa antibiotics, Chlorthalidone, Daptomycin, Latex,  Amoxicillin-pot clavulanate, Aspirin, Cholestyramine, Dilaudid [hydromorphone hcl], and Lasix [furosemide]   Social History: The patient  reports that she has been smoking cigarettes and e-cigarettes. She started smoking about 2 years ago. She has been smoking about 0.00 packs per day for the past 20.00 years. She has never used smokeless tobacco. She reports that she does not drink alcohol or use drugs.   Family History: The patient's family history includes Breast cancer in her sister; Heart attack (age of onset: 56) in her father; Lung cancer in her father.   ROS:  Please see the history of present illness. Otherwise, complete review of systems is positive for {NONE DEFAULTED:18576::"none"}.  All other systems are reviewed and negative.   Physical Exam: VS:  LMP 07/24/2011 , BMI There is no height or weight  on file to calculate BMI.  Wt Readings from Last 3 Encounters:  08/05/19 165 lb (74.8 kg)  07/30/19 166 lb (75.3 kg)  05/31/19 157 lb (71.2 kg)    General: Patient appears comfortable at rest. HEENT: Conjunctiva and lids normal, oropharynx clear with moist mucosa. Neck: Supple, no elevated JVP or carotid bruits, no thyromegaly. Lungs: Clear to auscultation, nonlabored breathing at rest. Cardiac: Regular rate and rhythm, no S3 or significant systolic murmur, no pericardial rub. Abdomen: Soft, nontender, no hepatomegaly, bowel sounds present, no guarding or rebound. Extremities: No pitting edema, distal pulses 2+. Skin: Warm and dry. Musculoskeletal: No kyphosis. Neuropsychiatric: Alert and oriented x3, affect grossly appropriate.  ECG:  {EKG/Telemetry Strips Reviewed:[phone]}  Recent Labwork: 12/11/2018: Hemoglobin 14.6; Platelets 182 07/31/2019: ALT 52; AST 46; BUN 8; Creatinine, Ser 0.55; Potassium 4.2; Sodium 137     Component Value Date/Time   CHOL 151 07/31/2019 1004   TRIG 287 (H) 07/31/2019 1004   HDL 27 (L) 07/31/2019 1004   CHOLHDL 5.6 (H) 07/31/2019 1004   CHOLHDL  5.1 (H) 07/05/2016 0947   VLDL UNABLE TO CALCULATE IF TRIGLYCERIDE OVER 400 mg/dL 05/24/2016 0415   LDLCALC 77 07/31/2019 1004    Other Studies Reviewed Today:  Nuclear myoview stress 2019  Nuclear stress EF: 51%.  There was no ST segment deviation noted during stress.  The study is normal.  This is a low risk study.  Normal pharmacologic nuclear stress test with no evidence for prior infarct or ischemia.  LVEF calculated at 51% but appears better, correlation with echocardiogram is recommended.   Echocardiogram 09/29/2016 Study Conclusions   - Left ventricle: The cavity size was normal. Wall thickness was  increased in a pattern of moderate LVH. Systolic function was  normal. The estimated ejection fraction was in the range of 55%  to 60%. Wall motion was normal; there were no regional wall  motion abnormalities. Doppler parameters are consistent with  abnormal left ventricular relaxation (grade 1 diastolic  dysfunction). The E&'/e&' ratio is between 8-15, suggesting  indeterminate LV filling pressure.  - Aortic valve: Trileaflet. Sclerosis without stenosis. There was  trivial regurgitation.  - Mitral valve: Mildly thickened leaflets . There was trivial  regurgitation.  - Left atrium: The atrium was normal in size.  - Tricuspid valve: There was trivial regurgitation.  - Pulmonary arteries: PA peak pressure: 13 mm Hg (S).  - Inferior vena cava: The vessel was normal in size. The  respirophasic diameter changes were in the normal range (>= 50%),  consistent with normal central venous pressure.   Impressions:  - Compared to a prior study in 2016, the LVEF has improved from 25-30% up to 55-60%.   Assessment and Plan:  1. Essential hypertension   2. Hypertriglyceridemia   3. Tobacco abuse disorder   4. History of ischemic cardiomyopathy   5. Type 2 diabetes mellitus without complication, with long-term current use of insulin ([place])    1. Essential  hypertension ***  2. Hypertriglyceridemia ***  3. Tobacco abuse disorder ***  4. History of ischemic cardiomyopathy ***  5. Type 2 diabetes mellitus without complication, with long-term current use of insulin (HCC) ***  Medication Adjustments/Labs and Tests Ordered: Current medicines are reviewed at length with the patient today.  Concerns regarding medicines are outlined above.    There are no Patient Instructions on file for this visit.       Signed, Levell July, NP 08/08/2019 11:09 PM    [facility]

## 2019-08-09 ENCOUNTER — Ambulatory Visit: Payer: Medicaid Other | Admitting: Cardiology

## 2019-08-09 ENCOUNTER — Telehealth: Payer: Self-pay

## 2019-08-09 NOTE — Telephone Encounter (Signed)
-----   Message from Hoy Register, MD sent at 08/06/2019 12:43 PM EST ----- Total cholesterol is normal however triglycerides which is a type of cholesterol is slightly elevated.  Please advise her to take fish oil capsules in addition to her regular medications.  Liver enzymes are also slightly elevated compared to previous labs.  We will monitor this and if elevation persists we will consider reducing the dose of her cholesterol pill as that could be a culprit.  Glucose is elevated.  Please encourage to adhere to prescribed regimen for diabetes.

## 2019-08-09 NOTE — Telephone Encounter (Signed)
Patient name and DOB has been verified Patient was informed of lab results. Patient had no questions.  

## 2019-08-10 ENCOUNTER — Ambulatory Visit: Payer: Medicaid Other | Admitting: Cardiology

## 2019-09-17 ENCOUNTER — Other Ambulatory Visit: Payer: Self-pay

## 2019-09-17 ENCOUNTER — Ambulatory Visit: Payer: Medicaid Other | Admitting: Cardiology

## 2019-09-17 ENCOUNTER — Encounter: Payer: Self-pay | Admitting: Cardiology

## 2019-09-17 VITALS — BP 118/78 | HR 88 | Ht 67.0 in | Wt 166.5 lb

## 2019-09-17 DIAGNOSIS — Z720 Tobacco use: Secondary | ICD-10-CM | POA: Diagnosis not present

## 2019-09-17 DIAGNOSIS — I10 Essential (primary) hypertension: Secondary | ICD-10-CM | POA: Diagnosis not present

## 2019-09-17 DIAGNOSIS — E119 Type 2 diabetes mellitus without complications: Secondary | ICD-10-CM | POA: Diagnosis not present

## 2019-09-17 DIAGNOSIS — I5022 Chronic systolic (congestive) heart failure: Secondary | ICD-10-CM

## 2019-09-17 DIAGNOSIS — E781 Pure hyperglyceridemia: Secondary | ICD-10-CM | POA: Diagnosis not present

## 2019-09-17 MED ORDER — FENOFIBRATE 48 MG PO TABS: 48.0000 mg | ORAL_TABLET | Freq: Every day | ORAL | 3 refills | Status: DC

## 2019-09-17 NOTE — Patient Instructions (Addendum)
Medication Instructions:  Your physician recommends that you continue on your current medications as directed. Please refer to the Current Medication list given to you today.  *If you need a refill on your cardiac medications before your next appointment, please call your pharmacy*   Lab Work: None ordered  If you have labs (blood work) drawn today and your tests are completely normal, you will receive your results only by: Marland Kitchen MyChart Message (if you have MyChart) OR . A paper copy in the mail If you have any lab test that is abnormal or we need to change your treatment, we will call you to review the results.   Testing/Procedures: None ordered   Follow-Up: At [facility], you and your health needs are our priority.  As part of our continuing mission to provide you with exceptional heart care, we have created designated Provider Care Teams.  These Care Teams include your primary Cardiologist (physician) and Advanced Practice Providers (APPs -  Physician Assistants and Nurse Practitioners) who all work together to provide you with the care you need, when you need it.  We recommend signing up for the patient portal called "MyChart".  Sign up information is provided on this After Visit Summary.  MyChart is used to connect with patients for Virtual Visits (Telemedicine).  Patients are able to view lab/test results, encounter notes, upcoming appointments, etc.  Non-urgent messages can be sent to your provider as well.   To learn more about what you can do with MyChart, go to [email or web address].    Your next appointment:   6 month(s)  The format for your next appointment:   In Person  Provider:   You may see Kristeen Miss, MD or one of the following Advanced Practice Providers on your designated Care Team:    Tereso Newcomer, [name]  Vin Baneberry, [place]  Berton Bon, NP    Other Instructions  Lifestyle Modifications to Prevent and Treat Heart Disease -Recommend heart  healthy/Mediterranean diet, with whole grains, fruits, vegetables, fish, lean meats, nuts, olive oil and avocado oil.  -Limit salt intake to less than 2000 mg per day.  -Recommend moderate walking, starting slowly with a few minutes and working up to 3-5 times/week for 30-50 minutes each session. Aim for at least 150 minutes.week. Goal should be pace of 3 miles/hours, or walking 1.5 miles in 30 minutes -Recommend avoidance of tobacco products. Avoid excess alcohol. -Keep blood pressure well controlled, ideally less than 130/80.  ======================================   Diabetes Mellitus and Nutrition, Adult When you have diabetes (diabetes mellitus), it is very important to have healthy eating habits because your blood sugar (glucose) levels are greatly affected by what you eat and drink. Eating healthy foods in the appropriate amounts, at about the same times every day, can help you:  Control your blood glucose.  Lower your risk of heart disease.  Improve your blood pressure.  Reach or maintain a healthy weight. Every person with diabetes is different, and each person has different needs for a meal plan. Your health care provider may recommend that you work with a diet and nutrition specialist (dietitian) to make a meal plan that is best for you. Your meal plan may vary depending on factors such as:  The calories you need.  The medicines you take.  Your weight.  Your blood glucose, blood pressure, and cholesterol levels.  Your activity level.  Other health conditions you have, such as heart or kidney disease. How do carbohydrates affect me? Carbohydrates, also called carbs,  affect your blood glucose level more than any other type of food. Eating carbs naturally raises the amount of glucose in your blood. Carb counting is a method for keeping track of how many carbs you eat. Counting carbs is important to keep your blood glucose at a healthy level, especially if you use insulin or  take certain oral diabetes medicines. It is important to know how many carbs you can safely have in each meal. This is different for every person. Your dietitian can help you calculate how many carbs you should have at each meal and for each snack. Foods that contain carbs include:  Bread, cereal, rice, pasta, and crackers.  Potatoes and corn.  Peas, beans, and lentils.  Milk and yogurt.  Fruit and juice.  Desserts, such as cakes, cookies, ice cream, and candy. How does alcohol affect me? Alcohol can cause a sudden decrease in blood glucose (hypoglycemia), especially if you use insulin or take certain oral diabetes medicines. Hypoglycemia can be a life-threatening condition. Symptoms of hypoglycemia (sleepiness, dizziness, and confusion) are similar to symptoms of having too much alcohol. If your health care provider says that alcohol is safe for you, follow these guidelines:  Limit alcohol intake to no more than 1 drink per day for nonpregnant women and 2 drinks per day for men. One drink equals 12 oz of beer, 5 oz of wine, or 1 oz of hard liquor.  Do not drink on an empty stomach.  Keep yourself hydrated with water, diet soda, or unsweetened iced tea.  Keep in mind that regular soda, juice, and other mixers may contain a lot of sugar and must be counted as carbs. What are tips for following this plan?  Reading food labels  Start by checking the serving size on the "Nutrition Facts" label of packaged foods and drinks. The amount of calories, carbs, fats, and other nutrients listed on the label is based on one serving of the item. Many items contain more than one serving per package.  Check the total grams (g) of carbs in one serving. You can calculate the number of servings of carbs in one serving by dividing the total carbs by 15. For example, if a food has 30 g of total carbs, it would be equal to 2 servings of carbs.  Check the number of grams (g) of saturated and trans fats in  one serving. Choose foods that have low or no amount of these fats.  Check the number of milligrams (mg) of salt (sodium) in one serving. Most people should limit total sodium intake to less than 2,300 mg per day.  Always check the nutrition information of foods labeled as "low-fat" or "nonfat". These foods may be higher in added sugar or refined carbs and should be avoided.  Talk to your dietitian to identify your daily goals for nutrients listed on the label. Shopping  Avoid buying canned, premade, or processed foods. These foods tend to be high in fat, sodium, and added sugar.  Shop around the outside edge of the grocery store. This includes fresh fruits and vegetables, bulk grains, fresh meats, and fresh dairy. Cooking  Use low-heat cooking methods, such as baking, instead of high-heat cooking methods like deep frying.  Cook using healthy oils, such as olive, canola, or sunflower oil.  Avoid cooking with butter, cream, or high-fat meats. Meal planning  Eat meals and snacks regularly, preferably at the same times every day. Avoid going long periods of time without eating.  Eat foods  high in fiber, such as fresh fruits, vegetables, beans, and whole grains. Talk to your dietitian about how many servings of carbs you can eat at each meal.  Eat 4-6 ounces (oz) of lean protein each day, such as lean meat, chicken, fish, eggs, or tofu. One oz of lean protein is equal to: ? 1 oz of meat, chicken, or fish. ? 1 egg. ?  cup of tofu.  Eat some foods each day that contain healthy fats, such as avocado, nuts, seeds, and fish. Lifestyle  Check your blood glucose regularly.  Exercise regularly as told by your health care provider. This may include: ? 150 minutes of moderate-intensity or vigorous-intensity exercise each week. This could be brisk walking, biking, or water aerobics. ? Stretching and doing strength exercises, such as yoga or weightlifting, at least 2 times a week.  Take  medicines as told by your health care provider.  Do not use any products that contain nicotine or tobacco, such as cigarettes and e-cigarettes. If you need help quitting, ask your health care provider.  Work with a Social worker or diabetes educator to identify strategies to manage stress and any emotional and social challenges. Questions to ask a health care provider  Do I need to meet with a diabetes educator?  Do I need to meet with a dietitian?  What number can I call if I have questions?  When are the best times to check my blood glucose? Where to find more information:  American Diabetes Association: diabetes.org  Academy of Nutrition and Dietetics: www.eatright.[employer] of Diabetes and Digestive and Kidney Diseases (NIH): [employer] Summary  A healthy meal plan will help you control your blood glucose and maintain a healthy lifestyle.  Working with a diet and nutrition specialist (dietitian) can help you make a meal plan that is best for you.  Keep in mind that carbohydrates (carbs) and alcohol have immediate effects on your blood glucose levels. It is important to count carbs and to use alcohol carefully. This information is not intended to replace advice given to you by your health care provider. Make sure you discuss any questions you have with your health care provider. Document Revised: 05/27/2017 Document Reviewed: 07/19/2016 Elsevier Patient Education  2020 [employer] with Quitting Smoking  Quitting smoking is a physical and mental challenge. You will face cravings, withdrawal symptoms, and temptation. Before quitting, work with your health care provider to make a plan that can help you cope. Preparation can help you quit and keep you from giving in. How can I cope with cravings? Cravings usually last for 5-10 minutes. If you get through it, the craving will pass. Consider taking the following actions to help you cope with  cravings:  Keep your mouth busy: ? Chew sugar-free gum. ? Suck on hard candies or a straw. ? Brush your teeth.  Keep your hands and body busy: ? Immediately change to a different activity when you feel a craving. ? Squeeze or play with a ball. ? Do an activity or a hobby, like making bead jewelry, practicing needlepoint, or working with wood. ? Mix up your normal routine. ? Take a short exercise break. Go for a quick walk or run up and down stairs. ? Spend time in public places where smoking is not allowed.  Focus on doing something kind or helpful for someone else.  Call a friend or family member to talk during a craving.  Join a support group.  Call a  quit line, such as 1-800-QUIT-NOW.  Talk with your health care provider about medicines that might help you cope with cravings and make quitting easier for you. How can I deal with withdrawal symptoms? Your body may experience negative effects as it tries to get used to not having nicotine in the system. These effects are called withdrawal symptoms. They may include:  Feeling hungrier than normal.  Trouble concentrating.  Irritability.  Trouble sleeping.  Feeling depressed.  Restlessness and agitation.  Craving a cigarette. To manage withdrawal symptoms:  Avoid places, people, and activities that trigger your cravings.  Remember why you want to quit.  Get plenty of sleep.  Avoid coffee and other caffeinated drinks. These may worsen some of your symptoms. How can I handle social situations? Social situations can be difficult when you are quitting smoking, especially in the first few weeks. To manage this, you can:  Avoid parties, bars, and other social situations where people might be smoking.  Avoid alcohol.  Leave right away if you have the urge to smoke.  Explain to your family and friends that you are quitting smoking. Ask for understanding and support.  Plan activities with friends or family where  smoking is not an option. What are some ways I can cope with stress? Wanting to smoke may cause stress, and stress can make you want to smoke. Find ways to manage your stress. Relaxation techniques can help. For example:  Breathe slowly and deeply, in through your nose and out through your mouth.  Listen to soothing, relaxing music.  Talk with a family member or friend about your stress.  Light a candle.  Soak in a bath or take a shower.  Think about a peaceful place. What are some ways I can prevent weight gain? Be aware that many people gain weight after they quit smoking. However, not everyone does. To keep from gaining weight, have a plan in place before you quit and stick to the plan after you quit. Your plan should include:  Having healthy snacks. When you have a craving, it may help to: ? Eat plain popcorn, crunchy carrots, celery, or other cut vegetables. ? Chew sugar-free gum.  Changing how you eat: ? Eat small portion sizes at meals. ? Eat 4-6 small meals throughout the day instead of 1-2 large meals a day. ? Be mindful when you eat. Do not watch television or do other things that might distract you as you eat.  Exercising regularly: ? Make time to exercise each day. If you do not have time for a long workout, do short bouts of exercise for 5-10 minutes several times a day. ? Do some form of strengthening exercise, like weight lifting, and some form of aerobic exercise, like running or swimming.  Drinking plenty of water or other low-calorie or no-calorie drinks. Drink 6-8 glasses of water daily, or as much as instructed by your health care provider. Summary  Quitting smoking is a physical and mental challenge. You will face cravings, withdrawal symptoms, and temptation to smoke again. Preparation can help you as you go through these challenges.  You can cope with cravings by keeping your mouth busy (such as by chewing gum), keeping your body and hands busy, and making  calls to family, friends, or a helpline for people who want to quit smoking.  You can cope with withdrawal symptoms by avoiding places where people smoke, avoiding drinks with caffeine, and getting plenty of rest.  Ask your health care provider about the  different ways to prevent weight gain, avoid stress, and handle social situations. This information is not intended to replace advice given to you by your health care provider. Make sure you discuss any questions you have with your health care provider. Document Revised: 05/27/2017 Document Reviewed: 06/11/2016 Elsevier Patient Education  2020 [employer].

## 2019-09-17 NOTE — Progress Notes (Signed)
Cardiology Office Note:    Date:  09/17/2019   ID:  [name], DOB 01-Jan-1961, MRN [id number]  PCP:  Ebony Rakes, MD  Cardiologist:  Ebony Moores, MD  Referring MD: Ebony Rakes, MD   Chief Complaint  Patient presents with  . Follow-up    prior heart failure    History of Present Illness:    [name] is a 59 y.o. female with a past medical history significant for hypertriglyceridemia, poorly controlled insulin-dependent diabetes, hypertension and tobacco abuse.  She has a history of systolic heart failure with EF 25-30% in 2016.  Subsequent cardiac catheterization at that time revealed widely patent coronary arteries.  She was determined to have a nonischemic cardiomyopathy which improved with medical therapy.  Repeat echocardiogram in 09/2016 showed normalization of EF to 55-60%.  She was evaluated in 07/2017 for pressure-like chest pain and palpitations.  Stress test showed no ischemia and monitor showed no arrhythmias.  Her triglycerides were however found to be markedly elevated in the 300 range and she was started on fenofibrate.  She was last seen in the office on 04/27/2018 by [name], [name].  It was noted that the patient had previously admitted to being noncompliant with her insulin regimen.  She was seen just prior to that in the ED with a glucose level in the 500 range, fortunately no signs of DKA.  During that appointment she was not having any chest discomfort or dyspnea.  She was not taking her fenofibrate at that time and had run out.  Her prescription was refilled.  [name] is here today as she is out of her fenofibrate. She has not been active due to Covid restrictions. She feels like her heart beats hard when she has to walk a distance and she feels that she is very deconditioned. She has an occasional "bee sting" quick stab in her chest, nothing prolonged. She has some mild edema only after being on her feet for a prolonged time, none at  present. No othopnea. She does have an issue with dizziness that has improved with meclizine that she takes every day.   She is trying to quit smoking. She is down from 1.5 PPD to about 3-4 cigarettes per day. She says that she has switched to diet green tea and denies any other sugar sweetened beverages.    Cardiac studies   Stress Myoview 08/11/2017 Study Highlights    Nuclear stress EF: 51%.  There was no ST segment deviation noted during stress.  The study is normal.  This is a low risk study.   Normal pharmacologic nuclear stress test with no evidence for prior infarct or ischemia.  LVEF calculated at 51% but appears better, correlation with echocardiogram is recommended.      Echocardiogram 09/29/2016 Study Conclusions   - Left ventricle: The cavity size was normal. Wall thickness was  increased in a pattern of moderate LVH. Systolic function was  normal. The estimated ejection fraction was in the range of 55%  to 60%. Wall motion was normal; there were no regional wall  motion abnormalities. Doppler parameters are consistent with  abnormal left ventricular relaxation (grade 1 diastolic  dysfunction). The E&'/e&' ratio is between 8-15, suggesting  indeterminate LV filling pressure.  - Aortic valve: Trileaflet. Sclerosis without stenosis. There was  trivial regurgitation.  - Mitral valve: Mildly thickened leaflets . There was trivial  regurgitation.  - Left atrium: The atrium was normal in size.  - Tricuspid valve: There was  trivial regurgitation.  - Pulmonary arteries: PA peak pressure: 13 mm Hg (S).  - Inferior vena cava: The vessel was normal in size. The  respirophasic diameter changes were in the normal range (>= 50%),  consistent with normal central venous pressure.   Impressions:  - Compared to a prior study in 2016, the LVEF has improved from  25-30% up to 55-60%.   Left Heart Cath and Coronary Angiography 06/24/15   Conclusion  1. There is severe left ventricular systolic dysfunction.    Widely patent coronary arteries. Right dominant coronary circulation anatomy.  Severe global left ventricular dysfunction with EF less than 20%. Upper normal left ventricular filling pressures with EDP less than 18 mmHg.  RECOMMENDATIONS:   Medical therapy  Consider resynchronization therapy if there is no improvement in LV function on meds.      Past Medical History:  Diagnosis Date  . Anxiety   . Arthritis   . Chronic systolic heart failure (HCC)    EF 20-25% ECHO 05/2015  . COPD (chronic obstructive pulmonary disease) ([place])   . Depression   . Diabetes mellitus without complication ([place])    Type II  . Dysrhythmia    pt unsure of name of arrythmia - " my heart rate will drop all of a sudden" -no current treatment - atrial flutter  . Fibrillation, atrial ([place])   . Gallstones   . GERD (gastroesophageal reflux disease)   . Headache(784.0)    migraines  . Hypertension   . Osteomyelitis (HCC)    R ankle  . Rash    arms    Past Surgical History:  Procedure Laterality Date  . ANKLE FRACTURE SURGERY Right 2015  . CARDIAC CATHETERIZATION N/A 06/24/2015   Procedure: Left Heart Cath and Coronary Angiography;  Surgeon: Belva Crome, MD;  Location: [facility];  Service: Cardiovascular;  Laterality: N/A;  . CARPAL TUNNEL RELEASE     bil  . CHOLECYSTECTOMY  11/24/2011   Procedure: LAPAROSCOPIC CHOLECYSTECTOMY WITH INTRAOPERATIVE CHOLANGIOGRAM;  Surgeon: Joyice Faster. Cornett, MD;  Location: [facility];  Service: General;  Laterality: N/A;  Laparoscopic Cholecystectomy with Cholangiogram  . COLONOSCOPY    . ECTOPIC PREGNANCY SURGERY  many yrs ago  . HARDWARE REMOVAL Right 07/15/2015   Procedure: RIGHT ANKLE HARDWARE REMOVAL, PLACEMENT OF STIMULAN ANTIBIOTIC BEADS AND PREVENA WOUND VAC.;  Surgeon: Meredith Pel, MD;  Location: [facility];  Service: Orthopedics;  Laterality: Right;  . TOOTH EXTRACTION  N/A 12/03/2016   Procedure: DENTAL EXTRACTIONS with Alveoloplasty;  Surgeon: Diona Browner, DDS;  Location: [facility];  Service: Oral Surgery;  Laterality: N/A;    Current Medications: Current Meds  Medication Sig  . albuterol (PROVENTIL HFA;VENTOLIN HFA) 108 (90 Base) MCG/ACT inhaler Inhale 2 puffs into the lungs every 4 (four) hours as needed for wheezing or shortness of breath.  Marland Kitchen atorvastatin (LIPITOR) 20 MG tablet Take 1 tablet (20 mg total) by mouth daily.  . Blood Glucose Monitoring Suppl (ACCU-CHEK GUIDE) w/Device KIT 1 each by Does not apply route 4 (four) times daily.  Marland Kitchen dicyclomine (BENTYL) 10 MG capsule Take 1 capsule (10 mg total) by mouth 3 (three) times daily before meals.  Marland Kitchen esomeprazole (NEXIUM) 20 MG capsule Take 1 capsule (20 mg total) by mouth daily.  . fenofibrate (TRICOR) 48 MG tablet Take 1 tablet (48 mg total) by mouth daily.  Marland Kitchen FLUoxetine (PROZAC) 40 MG capsule Take 1 capsule (40 mg total) by mouth at bedtime.  Marland Kitchen glimepiride (AMARYL) 4 MG  tablet Take 2 tablets (8 mg total) by mouth daily with breakfast.  . glucose blood (ACCU-CHEK GUIDE) test strip Use as instructed to test blood sugar up to four times daily  . Insulin Glargine (LANTUS SOLOSTAR) 100 UNIT/ML Solostar Pen Inject subcutaneously twice daily 30 units in the morning and 20 units in the evening.  Elmore Guise Devices (ACCU-CHEK SOFTCLIX) lancets Use as instructed daily.  . meclizine (ANTIVERT) 25 MG tablet Take 1 tablet (25 mg total) by mouth 3 (three) times daily as needed for dizziness.  . metoprolol succinate (TOPROL-XL) 25 MG 24 hr tablet Take 1 tablet (25 mg total) by mouth daily.  . SURE COMFORT PEN NEEDLES 31G X 5 MM MISC USE ONCE A DAY  . tiotropium (SPIRIVA) 18 MCG inhalation capsule Place 1 capsule (18 mcg total) into inhaler and inhale daily.  . [DISCONTINUED] fenofibrate (TRICOR) 48 MG tablet Take 1 tablet (48 mg total) by mouth daily.     Allergies:   Biaxin [clarithromycin], Clarithromycin, Lisinopril,  Sulfa antibiotics, Chlorthalidone, Daptomycin, Latex, Amoxicillin-pot clavulanate, Aspirin, Cholestyramine, Dilaudid [hydromorphone hcl], and Lasix [furosemide]   Social History   Socioeconomic History  . Marital status: Divorced    Spouse name: Not on file  . Number of children: Not on file  . Years of education: Not on file  . Highest education level: Not on file  Occupational History  . Not on file  Tobacco Use  . Smoking status: Light Tobacco Smoker    Packs/day: 0.00    Years: 20.00    Pack years: 0.00    Types: Cigarettes, E-cigarettes    Start date: 09/19/2016  . Smokeless tobacco: Never Used  Substance and Sexual Activity  . Alcohol use: No    Alcohol/week: 0.0 standard drinks  . Drug use: No  . Sexual activity: Not Currently    Birth control/protection: Post-menopausal  Other Topics Concern  . Not on file  Social History Narrative  . Not on file   Social Determinants of Health   Financial Resource Strain:   . Difficulty of Paying Living Expenses:   Food Insecurity:   . Worried About Charity fundraiser in the Last Year:   . Arboriculturist in the Last Year:   Transportation Needs:   . Film/video editor (Medical):   Marland Kitchen Lack of Transportation (Non-Medical):   Physical Activity:   . Days of Exercise per Week:   . Minutes of Exercise per Session:   Stress:   . Feeling of Stress :   Social Connections:   . Frequency of Communication with Friends and Family:   . Frequency of Social Gatherings with Friends and Family:   . Attends Religious Services:   . Active Member of Clubs or Organizations:   . Attends Archivist Meetings:   Marland Kitchen Marital Status:      Family History: The patient's family history includes Breast cancer in her sister; Heart attack (age of onset: 1) in her father; Lung cancer in her father. ROS:   Please see the history of present illness.     All other systems reviewed and are negative.   EKG:  EKG is not ordered today.    Recent Labs: 12/11/2018: Hemoglobin 14.6; Platelets 182 07/31/2019: ALT 52; BUN 8; Creatinine, Ser 0.55; Potassium 4.2; Sodium 137   Recent Lipid Panel    Component Value Date/Time   CHOL 151 07/31/2019 1004   TRIG 287 (H) 07/31/2019 1004   HDL 27 (L) 07/31/2019 1004  CHOLHDL 5.6 (H) 07/31/2019 1004   CHOLHDL 5.1 (H) 07/05/2016 0947   VLDL UNABLE TO CALCULATE IF TRIGLYCERIDE OVER 400 mg/dL 05/24/2016 0415   LDLCALC 77 07/31/2019 1004    Physical Exam:    VS:  BP 118/78   Pulse 88   Ht '5\' 7"'$  (1.702 m)   Wt 166 lb 8 oz (75.5 kg)   LMP 07/24/2011   SpO2 97%   BMI 26.08 kg/m     Wt Readings from Last 6 Encounters:  09/17/19 166 lb 8 oz (75.5 kg)  08/05/19 165 lb (74.8 kg)  07/30/19 166 lb (75.3 kg)  05/31/19 157 lb (71.2 kg)  10/02/18 166 lb (75.3 kg)  06/02/18 161 lb 9.6 oz (73.3 kg)     Physical Exam  Constitutional: She is oriented to person, place, and time. She appears well-developed and well-nourished. No distress.  HENT:  Head: Normocephalic and atraumatic.  Neck: No JVD present.  Cardiovascular: Normal rate, regular rhythm, normal heart sounds and intact distal pulses. Exam reveals no gallop and no friction rub.  No murmur heard. Pulmonary/Chest: Effort normal and breath sounds normal. No respiratory distress. She has no wheezes. She has no rales.  Abdominal: Soft. Bowel sounds are normal.  Central obesity  Musculoskeletal:        General: No edema. Normal range of motion.     Cervical back: Normal range of motion and neck supple.  Neurological: She is alert and oriented to person, place, and time.  Skin: Skin is warm and dry.  Psychiatric: She has a normal mood and affect. Her behavior is normal. Judgment and thought content normal.  Vitals reviewed.    ASSESSMENT:    1. Diabetes mellitus without complication ([place])   2. Hypertriglyceridemia   3. Essential (primary) hypertension   4. Tobacco abuse   5. Chronic systolic CHF (congestive heart failure)  (HCC)    PLAN:    In order of problems listed above:  Poorly controlled diabetes on insulin -A1c is at its highest at 13.4 per labs 07/30/2019 -She really needs much better blood sugar control.  I would recommend referral to the diabetes education center if she has not been in the last year. -I had a long discussion with her on better food choices and increasing her activity with brisk walking, starting with 5 minutes and building up to 30 minutes daily. This will help her blood sugar as well as her vascular health.   Hypertriglyceridemia -On fenofibrate 48 mg daily- she has been out for 2 weeks. Her PCP switched her from pravastatin to atorvastatin in February.  -Recent lipid panel 07/2019: TC 151, HDL 27, triglycerides 287, LDL 77. -Getting her blood sugar under control would be the best thing to help her triglycerides. -Has f/u with her PCP next month.   Hypertension -On Toprol-XL 25 mg daily.  History of allergy to ACE inhibitor -BP well controlled.   Tobacco use -20+ year history of smoking. -Down from 1.5 PPD to 3-4 cigarettes per day.  -Pt strongly advised on cessation to reduce her CV risk.   History of nonischemic cardiomyopathy -EF down to 25-30% in 2016.  Cardiac cath showed normal coronaries.  EF improved to 50-55% with medical therapy -Patient continues on beta-blocker.  Noted allergy to ACE inhibitor. -Volume stable. No HF symptoms.    Medication Adjustments/Labs and Tests Ordered: Current medicines are reviewed at length with the patient today.  Concerns regarding medicines are outlined above. Labs and tests ordered and medication changes are  outlined in the patient instructions below:  Patient Instructions  Medication Instructions:  Your physician recommends that you continue on your current medications as directed. Please refer to the Current Medication list given to you today.  *If you need a refill on your cardiac medications before your next appointment, please  call your pharmacy*   Lab Work: None ordered  If you have labs (blood work) drawn today and your tests are completely normal, you will receive your results only by: Marland Kitchen MyChart Message (if you have MyChart) OR . A paper copy in the mail If you have any lab test that is abnormal or we need to change your treatment, we will call you to review the results.   Testing/Procedures: None ordered   Follow-Up: At [facility], you and your health needs are our priority.  As part of our continuing mission to provide you with exceptional heart care, we have created designated Provider Care Teams.  These Care Teams include your primary Cardiologist (physician) and Advanced Practice Providers (APPs -  Physician Assistants and Nurse Practitioners) who all work together to provide you with the care you need, when you need it.  We recommend signing up for the patient portal called "MyChart".  Sign up information is provided on this After Visit Summary.  MyChart is used to connect with patients for Virtual Visits (Telemedicine).  Patients are able to view lab/test results, encounter notes, upcoming appointments, etc.  Non-urgent messages can be sent to your provider as well.   To learn more about what you can do with MyChart, go to [email or web address].    Your next appointment:   6 month(s)  The format for your next appointment:   In Person  Provider:   You may see Ebony Moores, MD or one of the following Advanced Practice Providers on your designated Care Team:    Richardson Dopp, [name]  [place], [place]  Daune Perch, NP    Other Instructions  Lifestyle Modifications to Prevent and Treat Heart Disease -Recommend heart healthy/Mediterranean diet, with whole grains, fruits, vegetables, fish, lean meats, nuts, olive oil and avocado oil.  -Limit salt intake to less than 2000 mg per day.  -Recommend moderate walking, starting slowly with a few minutes and working up to 3-5 times/week for  30-50 minutes each session. Aim for at least 150 minutes.week. Goal should be pace of 3 miles/hours, or walking 1.5 miles in 30 minutes -Recommend avoidance of tobacco products. Avoid excess alcohol. -Keep blood pressure well controlled, ideally less than 130/80.  ======================================   Diabetes Mellitus and Nutrition, Adult When you have diabetes (diabetes mellitus), it is very important to have healthy eating habits because your blood sugar (glucose) levels are greatly affected by what you eat and drink. Eating healthy foods in the appropriate amounts, at about the same times every day, can help you:  Control your blood glucose.  Lower your risk of heart disease.  Improve your blood pressure.  Reach or maintain a healthy weight. Every person with diabetes is different, and each person has different needs for a meal plan. Your health care provider may recommend that you work with a diet and nutrition specialist (dietitian) to make a meal plan that is best for you. Your meal plan may vary depending on factors such as:  The calories you need.  The medicines you take.  Your weight.  Your blood glucose, blood pressure, and cholesterol levels.  Your activity level.  Other health conditions you have, such as heart or  kidney disease. How do carbohydrates affect me? Carbohydrates, also called carbs, affect your blood glucose level more than any other type of food. Eating carbs naturally raises the amount of glucose in your blood. Carb counting is a method for keeping track of how many carbs you eat. Counting carbs is important to keep your blood glucose at a healthy level, especially if you use insulin or take certain oral diabetes medicines. It is important to know how many carbs you can safely have in each meal. This is different for every person. Your dietitian can help you calculate how many carbs you should have at each meal and for each snack. Foods that contain carbs  include:  Bread, cereal, rice, pasta, and crackers.  Potatoes and corn.  Peas, beans, and lentils.  Milk and yogurt.  Fruit and juice.  Desserts, such as cakes, cookies, ice cream, and candy. How does alcohol affect me? Alcohol can cause a sudden decrease in blood glucose (hypoglycemia), especially if you use insulin or take certain oral diabetes medicines. Hypoglycemia can be a life-threatening condition. Symptoms of hypoglycemia (sleepiness, dizziness, and confusion) are similar to symptoms of having too much alcohol. If your health care provider says that alcohol is safe for you, follow these guidelines:  Limit alcohol intake to no more than 1 drink per day for nonpregnant women and 2 drinks per day for men. One drink equals 12 oz of beer, 5 oz of wine, or 1 oz of hard liquor.  Do not drink on an empty stomach.  Keep yourself hydrated with water, diet soda, or unsweetened iced tea.  Keep in mind that regular soda, juice, and other mixers may contain a lot of sugar and must be counted as carbs. What are tips for following this plan?  Reading food labels  Start by checking the serving size on the "Nutrition Facts" label of packaged foods and drinks. The amount of calories, carbs, fats, and other nutrients listed on the label is based on one serving of the item. Many items contain more than one serving per package.  Check the total grams (g) of carbs in one serving. You can calculate the number of servings of carbs in one serving by dividing the total carbs by 15. For example, if a food has 30 g of total carbs, it would be equal to 2 servings of carbs.  Check the number of grams (g) of saturated and trans fats in one serving. Choose foods that have low or no amount of these fats.  Check the number of milligrams (mg) of salt (sodium) in one serving. Most people should limit total sodium intake to less than 2,300 mg per day.  Always check the nutrition information of foods labeled  as "low-fat" or "nonfat". These foods may be higher in added sugar or refined carbs and should be avoided.  Talk to your dietitian to identify your daily goals for nutrients listed on the label. Shopping  Avoid buying canned, premade, or processed foods. These foods tend to be high in fat, sodium, and added sugar.  Shop around the outside edge of the grocery store. This includes fresh fruits and vegetables, bulk grains, fresh meats, and fresh dairy. Cooking  Use low-heat cooking methods, such as baking, instead of high-heat cooking methods like deep frying.  Cook using healthy oils, such as olive, canola, or sunflower oil.  Avoid cooking with butter, cream, or high-fat meats. Meal planning  Eat meals and snacks regularly, preferably at the same times every day.  Avoid going long periods of time without eating.  Eat foods high in fiber, such as fresh fruits, vegetables, beans, and whole grains. Talk to your dietitian about how many servings of carbs you can eat at each meal.  Eat 4-6 ounces (oz) of lean protein each day, such as lean meat, chicken, fish, eggs, or tofu. One oz of lean protein is equal to: ? 1 oz of meat, chicken, or fish. ? 1 egg. ?  cup of tofu.  Eat some foods each day that contain healthy fats, such as avocado, nuts, seeds, and fish. Lifestyle  Check your blood glucose regularly.  Exercise regularly as told by your health care provider. This may include: ? 150 minutes of moderate-intensity or vigorous-intensity exercise each week. This could be brisk walking, biking, or water aerobics. ? Stretching and doing strength exercises, such as yoga or weightlifting, at least 2 times a week.  Take medicines as told by your health care provider.  Do not use any products that contain nicotine or tobacco, such as cigarettes and e-cigarettes. If you need help quitting, ask your health care provider.  Work with a Social worker or diabetes educator to identify strategies to  manage stress and any emotional and social challenges. Questions to ask a health care provider  Do I need to meet with a diabetes educator?  Do I need to meet with a dietitian?  What number can I call if I have questions?  When are the best times to check my blood glucose? Where to find more information:  American Diabetes Association: diabetes.org  Academy of Nutrition and Dietetics: www.eatright.[employer] of Diabetes and Digestive and Kidney Diseases (NIH): [employer] Summary  A healthy meal plan will help you control your blood glucose and maintain a healthy lifestyle.  Working with a diet and nutrition specialist (dietitian) can help you make a meal plan that is best for you.  Keep in mind that carbohydrates (carbs) and alcohol have immediate effects on your blood glucose levels. It is important to count carbs and to use alcohol carefully. This information is not intended to replace advice given to you by your health care provider. Make sure you discuss any questions you have with your health care provider. Document Revised: 05/27/2017 Document Reviewed: 07/19/2016 Elsevier Patient Education  2020 [place] with Quitting Smoking  Quitting smoking is a physical and mental challenge. You will face cravings, withdrawal symptoms, and temptation. Before quitting, work with your health care provider to make a plan that can help you cope. Preparation can help you quit and keep you from giving in. How can I cope with cravings? Cravings usually last for 5-10 minutes. If you get through it, the craving will pass. Consider taking the following actions to help you cope with cravings:  Keep your mouth busy: ? Chew sugar-free gum. ? Suck on hard candies or a straw. ? Brush your teeth.  Keep your hands and body busy: ? Immediately change to a different activity when you feel a craving. ? Squeeze or play with a ball. ? Do an activity or a hobby,  like making bead jewelry, practicing needlepoint, or working with wood. ? Mix up your normal routine. ? Take a short exercise break. Go for a quick walk or run up and down stairs. ? Spend time in public places where smoking is not allowed.  Focus on doing something kind or helpful for someone else.  Call a friend or family member to talk  during a craving.  Join a support group.  Call a quit line, such as 1-800-QUIT-NOW.  Talk with your health care provider about medicines that might help you cope with cravings and make quitting easier for you. How can I deal with withdrawal symptoms? Your body may experience negative effects as it tries to get used to not having nicotine in the system. These effects are called withdrawal symptoms. They may include:  Feeling hungrier than normal.  Trouble concentrating.  Irritability.  Trouble sleeping.  Feeling depressed.  Restlessness and agitation.  Craving a cigarette. To manage withdrawal symptoms:  Avoid places, people, and activities that trigger your cravings.  Remember why you want to quit.  Get plenty of sleep.  Avoid coffee and other caffeinated drinks. These may worsen some of your symptoms. How can I handle social situations? Social situations can be difficult when you are quitting smoking, especially in the first few weeks. To manage this, you can:  Avoid parties, bars, and other social situations where people might be smoking.  Avoid alcohol.  Leave right away if you have the urge to smoke.  Explain to your family and friends that you are quitting smoking. Ask for understanding and support.  Plan activities with friends or family where smoking is not an option. What are some ways I can cope with stress? Wanting to smoke may cause stress, and stress can make you want to smoke. Find ways to manage your stress. Relaxation techniques can help. For example:  Breathe slowly and deeply, in through your nose and out through  your mouth.  Listen to soothing, relaxing music.  Talk with a family member or friend about your stress.  Light a candle.  Soak in a bath or take a shower.  Think about a peaceful place. What are some ways I can prevent weight gain? Be aware that many people gain weight after they quit smoking. However, not everyone does. To keep from gaining weight, have a plan in place before you quit and stick to the plan after you quit. Your plan should include:  Having healthy snacks. When you have a craving, it may help to: ? Eat plain popcorn, crunchy carrots, celery, or other cut vegetables. ? Chew sugar-free gum.  Changing how you eat: ? Eat small portion sizes at meals. ? Eat 4-6 small meals throughout the day instead of 1-2 large meals a day. ? Be mindful when you eat. Do not watch television or do other things that might distract you as you eat.  Exercising regularly: ? Make time to exercise each day. If you do not have time for a long workout, do short bouts of exercise for 5-10 minutes several times a day. ? Do some form of strengthening exercise, like weight lifting, and some form of aerobic exercise, like running or swimming.  Drinking plenty of water or other low-calorie or no-calorie drinks. Drink 6-8 glasses of water daily, or as much as instructed by your health care provider. Summary  Quitting smoking is a physical and mental challenge. You will face cravings, withdrawal symptoms, and temptation to smoke again. Preparation can help you as you go through these challenges.  You can cope with cravings by keeping your mouth busy (such as by chewing gum), keeping your body and hands busy, and making calls to family, friends, or a helpline for people who want to quit smoking.  You can cope with withdrawal symptoms by avoiding places where people smoke, avoiding drinks with caffeine, and getting plenty  of rest.  Ask your health care provider about the different ways to prevent weight  gain, avoid stress, and handle social situations. This information is not intended to replace advice given to you by your health care provider. Make sure you discuss any questions you have with your health care provider. Document Revised: 05/27/2017 Document Reviewed: 06/11/2016 Elsevier Patient Education  2020 Car[place], Daune Perch, NP  09/17/2019 4:18 PM    [facility]

## 2019-09-27 ENCOUNTER — Other Ambulatory Visit: Payer: Self-pay | Admitting: Family Medicine

## 2019-09-27 DIAGNOSIS — H8113 Benign paroxysmal vertigo, bilateral: Secondary | ICD-10-CM

## 2019-10-10 ENCOUNTER — Other Ambulatory Visit: Payer: Self-pay | Admitting: Family Medicine

## 2019-10-10 DIAGNOSIS — E119 Type 2 diabetes mellitus without complications: Secondary | ICD-10-CM

## 2019-10-24 ENCOUNTER — Other Ambulatory Visit: Payer: Self-pay | Admitting: Family Medicine

## 2019-10-24 DIAGNOSIS — M5430 Sciatica, unspecified side: Secondary | ICD-10-CM

## 2019-10-24 DIAGNOSIS — Z794 Long term (current) use of insulin: Secondary | ICD-10-CM

## 2019-10-24 DIAGNOSIS — M549 Dorsalgia, unspecified: Secondary | ICD-10-CM

## 2019-10-24 DIAGNOSIS — E1165 Type 2 diabetes mellitus with hyperglycemia: Secondary | ICD-10-CM

## 2019-10-29 ENCOUNTER — Ambulatory Visit: Payer: Medicaid Other | Admitting: Family Medicine

## 2019-11-16 ENCOUNTER — Encounter: Payer: Self-pay | Admitting: Family Medicine

## 2019-11-16 ENCOUNTER — Ambulatory Visit: Payer: Medicaid Other | Attending: Family Medicine | Admitting: Family Medicine

## 2019-11-16 ENCOUNTER — Other Ambulatory Visit: Payer: Self-pay

## 2019-11-16 VITALS — BP 135/79 | HR 79 | Ht 67.0 in | Wt 163.0 lb

## 2019-11-16 DIAGNOSIS — L739 Follicular disorder, unspecified: Secondary | ICD-10-CM | POA: Diagnosis not present

## 2019-11-16 DIAGNOSIS — T148XXA Other injury of unspecified body region, initial encounter: Secondary | ICD-10-CM

## 2019-11-16 DIAGNOSIS — Z794 Long term (current) use of insulin: Secondary | ICD-10-CM | POA: Diagnosis not present

## 2019-11-16 DIAGNOSIS — E1165 Type 2 diabetes mellitus with hyperglycemia: Secondary | ICD-10-CM

## 2019-11-16 LAB — POCT GLYCOSYLATED HEMOGLOBIN (HGB A1C): HbA1c, POC (controlled diabetic range): 12.4 % — AB (ref 0.0–7.0)

## 2019-11-16 LAB — GLUCOSE, POCT (MANUAL RESULT ENTRY): POC Glucose: 211 mg/dl — AB (ref 70–99)

## 2019-11-16 MED ORDER — DOXYCYCLINE HYCLATE 100 MG PO TABS: 100.0000 mg | ORAL_TABLET | Freq: Two times a day (BID) | ORAL | 0 refills | Status: DC

## 2019-11-16 MED ORDER — NOVOLOG MIX 70/30 FLEXPEN (70-30) 100 UNIT/ML ~~LOC~~ SUPN: PEN_INJECTOR | SUBCUTANEOUS | 6 refills | Status: DC

## 2019-11-16 NOTE — Patient Instructions (Signed)

## 2019-11-16 NOTE — Progress Notes (Signed)
Subjective:  Patient ID: [name], female    DOB: 01-Jun-1961  Age: 59 y.o. MRN: [id number]  CC:  Chief Complaint  Patient presents with  . Diabetes     HPI [name]  is a 59 year old female with a history of type 2 diabetes mellitus (A1c 12.4), hypertension, CHF (EF 55-60% 2-D echo of 09/2016 which has improved from 25%-30% previously), anxiety, GERD who presents today for follow-up visit.  Fasting labs are in the 160s and she endorses noncompliance with a diabetic diet but has cut out sodas. She endorses compliance with Lantus as prescribed; she is due for an eye exam. She was seeing a Dermatologist for facial ingrown hair on the right side of her face but when she lost her Medicaid she could no longer see one.  She received cortisone injection twice a year as per patient from dermatology. She sustained a puncture wound to her left middle finger while trying to lift up some pieces of wood and is wondering if she can receive a tetanus shot today. With regards to all CHF she denies dyspnea, chest pain and all other medical conditions are stable. Past Medical History:  Diagnosis Date  . Anxiety   . Arthritis   . Chronic systolic heart failure (HCC)    EF 20-25% ECHO 05/2015  . COPD (chronic obstructive pulmonary disease) ([place])   . Depression   . Diabetes mellitus without complication ([place])    Type II  . Dysrhythmia    pt unsure of name of arrythmia - " my heart rate will drop all of a sudden" -no current treatment - atrial flutter  . Fibrillation, atrial ([place])   . Gallstones   . GERD (gastroesophageal reflux disease)   . Headache(784.0)    migraines  . Hypertension   . Osteomyelitis (HCC)    R ankle  . Rash    arms    Past Surgical History:  Procedure Laterality Date  . ANKLE FRACTURE SURGERY Right 2015  . CARDIAC CATHETERIZATION N/A 06/24/2015   Procedure: Left Heart Cath and Coronary Angiography;  Surgeon: Belva Crome, MD;  Location: [facility];  Service: Cardiovascular;  Laterality: N/A;  . CARPAL TUNNEL RELEASE     bil  . CHOLECYSTECTOMY  11/24/2011   Procedure: LAPAROSCOPIC CHOLECYSTECTOMY WITH INTRAOPERATIVE CHOLANGIOGRAM;  Surgeon: Joyice Faster. Cornett, MD;  Location: [facility];  Service: General;  Laterality: N/A;  Laparoscopic Cholecystectomy with Cholangiogram  . COLONOSCOPY    . ECTOPIC PREGNANCY SURGERY  many yrs ago  . HARDWARE REMOVAL Right 07/15/2015   Procedure: RIGHT ANKLE HARDWARE REMOVAL, PLACEMENT OF STIMULAN ANTIBIOTIC BEADS AND PREVENA WOUND VAC.;  Surgeon: Meredith Pel, MD;  Location: [facility];  Service: Orthopedics;  Laterality: Right;  . TOOTH EXTRACTION N/A 12/03/2016   Procedure: DENTAL EXTRACTIONS with Alveoloplasty;  Surgeon: Diona Browner, DDS;  Location: [facility];  Service: Oral Surgery;  Laterality: N/A;    Family History  Problem Relation Age of Onset  . Lung cancer Father   . Heart attack Father 32       CABG x4  . Breast cancer Sister        Survivor     Allergies  Allergen Reactions  . Biaxin [Clarithromycin] Anaphylaxis and Swelling  . Clarithromycin Shortness Of Breath and Swelling  . Lisinopril Swelling and Cough    Lip edema  . Sulfa Antibiotics Anaphylaxis, Shortness Of Breath, Swelling and Hypertension  . Chlorthalidone Nausea And Vomiting and Other (See Comments)  Clammy, Tachycardia, Headache   . Daptomycin Nausea And Vomiting  . Latex Hives and Rash  . Amoxicillin-Pot Clavulanate Diarrhea    Has patient had a PCN reaction causing immediate rash, facial/tongue/throat swelling, SOB or lightheadedness with hypotension:No Has patient had a PCN reaction causing severe rash involving mucus membranes or skin necrosis:No Has patient had a PCN reaction that required hospitalization:No Has patient had a PCN reaction occurring within THE LAST 10 YEARS.  #  #  #  YES  #  #  #  If all of the above answers are "NO", then may proceed with Cephalosporin use.   . Aspirin Nausea And Vomiting  and Rash    On 321m dosage  . Cholestyramine Nausea And Vomiting  . Dilaudid [Hydromorphone Hcl] Nausea And Vomiting  . Lasix [Furosemide] Nausea And Vomiting and Other (See Comments)    Headache     Outpatient Medications Prior to Visit  Medication Sig Dispense Refill  . ACCU-CHEK AVIVA PLUS test strip USE ONE TEST SSTRIP FOR BLOOD SUGAR FOUR TIMES DAILY 100 strip 6  . albuterol (PROVENTIL HFA;VENTOLIN HFA) 108 (90 Base) MCG/ACT inhaler Inhale 2 puffs into the lungs every 4 (four) hours as needed for wheezing or shortness of breath. 1 Inhaler 3  . atorvastatin (LIPITOR) 20 MG tablet Take 1 tablet (20 mg total) by mouth daily. 90 tablet 1  . Blood Glucose Monitoring Suppl (ACCU-CHEK GUIDE) w/Device KIT 1 each by Does not apply route 4 (four) times daily. 1 kit 0  . dicyclomine (BENTYL) 10 MG capsule Take 1 capsule (10 mg total) by mouth 3 (three) times daily before meals. 270 capsule 1  . esomeprazole (NEXIUM) 20 MG capsule Take 1 capsule (20 mg total) by mouth daily. 90 capsule 1  . fenofibrate (TRICOR) 48 MG tablet Take 1 tablet (48 mg total) by mouth daily. 90 tablet 3  . FLUoxetine (PROZAC) 40 MG capsule Take 1 capsule (40 mg total) by mouth at bedtime. 90 capsule 1  . glimepiride (AMARYL) 4 MG tablet Take 2 tablets (8 mg total) by mouth daily with breakfast. 180 tablet 1  . Insulin Glargine (LANTUS SOLOSTAR) 100 UNIT/ML Solostar Pen Inject subcutaneously twice daily 30 units in the morning and 20 units in the evening. 30 mL 6  . Lancet Devices (ACCU-CHEK SOFTCLIX) lancets Use as instructed daily. 1 each 5  . meclizine (ANTIVERT) 25 MG tablet Take 1 tablet (25 mg total) by mouth 3 (three) times daily as needed for dizziness. 180 tablet 1  . metoprolol succinate (TOPROL-XL) 25 MG 24 hr tablet Take 1 tablet (25 mg total) by mouth daily. 90 tablet 1  . SURE COMFORT PEN NEEDLES 31G X 5 MM MISC USE ONCE A DAY 100 each 5  . tiotropium (SPIRIVA) 18 MCG inhalation capsule Place 1 capsule (18 mcg  total) into inhaler and inhale daily. 90 capsule 1  . tiZANidine (ZANAFLEX) 4 MG tablet Take 1 tablet (4 mg total) by mouth every 8 (eight) hours as needed for muscle spasms. 90 tablet 3   Facility-Administered Medications Prior to Visit  Medication Dose Route Frequency Provider Last Rate Last Admin  . regadenoson (LEXISCAN) injection SOLN 0.4 mg  0.4 mg Intravenous Once NDorothy Spark MD      . technetium tetrofosmin (TC-MYOVIEW) injection 363.0millicurie  316.0millicurie Intravenous Once PRN NDorothy Spark MD         ROS Review of Systems  Constitutional: Negative for activity change, appetite change and fatigue.  HENT:  Negative for congestion, sinus pressure and sore throat.   Eyes: Negative for visual disturbance.  Respiratory: Negative for cough, chest tightness, shortness of breath and wheezing.   Cardiovascular: Negative for chest pain and palpitations.  Gastrointestinal: Negative for abdominal distention, abdominal pain and constipation.  Endocrine: Negative for polydipsia.  Genitourinary: Negative for dysuria and frequency.  Musculoskeletal: Negative for arthralgias and back pain.  Skin: Negative for rash.  Neurological: Negative for tremors, light-headedness and numbness.  Hematological: Does not bruise/bleed easily.  Psychiatric/Behavioral: Negative for agitation and behavioral problems.    Objective:  LMP 07/24/2011   BP/Weight 09/17/2019 01/26/1913 01/03/2955  Systolic BP 213 086 578  Diastolic BP 78 81 69  Wt. (Lbs) 166.5 165 166  BMI 26.08 25.84 26      Physical Exam Constitutional:      Appearance: She is well-developed.  Neck:     Vascular: No JVD.  Cardiovascular:     Rate and Rhythm: Normal rate.     Heart sounds: Normal heart sounds. No murmur.  Pulmonary:     Effort: Pulmonary effort is normal.     Breath sounds: Normal breath sounds. No wheezing or rales.  Chest:     Chest wall: No tenderness.  Abdominal:     General: Bowel sounds are  normal. There is no distension.     Palpations: Abdomen is soft. There is no mass.     Tenderness: There is no abdominal tenderness.  Musculoskeletal:        General: Normal range of motion.     Right lower leg: No edema.     Left lower leg: No edema.  Neurological:     Mental Status: She is alert and oriented to person, place, and time.  Psychiatric:        Mood and Affect: Mood normal.     CMP Latest Ref Rng & Units 07/31/2019 12/11/2018 10/02/2018  Glucose 65 - 99 mg/dL 245(H) 290(H) 318(H)  BUN 6 - 24 mg/dL 8 5(L) 5(L)  Creatinine 0.57 - 1.00 mg/dL 0.55(L) 0.53 0.61  Sodium 134 - 144 mmol/L 137 137 141  Potassium 3.5 - 5.2 mmol/L 4.2 4.0 4.0  Chloride 96 - 106 mmol/L 100 103 103  CO2 20 - 29 mmol/L _0 Calcium 8.7 - 10.2 mg/dL 9.1 9.2 9.1  Total Protein 6.0 - 8.5 g/dL 6.8 - 6.7  Total Bilirubin 0.0 - 1.2 mg/dL 0.3 - <0.2  Alkaline Phos 39 - 117 IU/L 151(H) - 118(H)  AST 0 - 40 IU/L 46(H) - 17  ALT 0 - 32 IU/L 52(H) - 21    Lipid Panel     Component Value Date/Time   CHOL 151 07/31/2019 1004   TRIG 287 (H) 07/31/2019 1004   HDL 27 (L) 07/31/2019 1004   CHOLHDL 5.6 (H) 07/31/2019 1004   CHOLHDL 5.1 (H) 07/05/2016 0947   VLDL UNABLE TO CALCULATE IF TRIGLYCERIDE OVER 400 mg/dL 05/24/2016 0415   LDLCALC 77 07/31/2019 1004    CBC    Component Value Date/Time   WBC 9.5 12/11/2018 2209   RBC 5.32 (H) 12/11/2018 2209   HGB 14.6 12/11/2018 2209   HCT 45.8 12/11/2018 2209   PLT 182 12/11/2018 2209   MCV 86.1 12/11/2018 2209   MCH 27.4 12/11/2018 2209   MCHC 31.9 12/11/2018 2209   RDW 13.0 12/11/2018 2209   LYMPHSABS 2.6 04/01/2016 2144   MONOABS 0.4 04/01/2016 2144   EOSABS 0.3 04/01/2016 2144   BASOSABS 0.0 04/01/2016 2144  Lab Results  Component Value Date   HGBA1C 12.4 (A) 11/16/2019     Assessment & Plan:   1. Type 2 diabetes mellitus with hyperglycemia, with long-term current use of insulin (HCC) Uncontrolled with A1c of 12.4 We will switch from  Lantus to NovoLog 70/30 Counseled on Diabetic diet, my plate method, 615 minutes of moderate intensity exercise/week Blood sugar logs with fasting goals of 80-120 mg/dl, random of less than 180 and in the event of sugars less than 60 mg/dl or greater than 400 mg/dl encouraged to notify the clinic. Advised on the need for annual eye exams, annual foot exams, Pneumonia vaccine. Her ophthalmologist is Dr. Katy Fitch and she will be calling you to schedule an appointment - POCT glucose (manual entry) - POCT glycosylated hemoglobin (Hb A1C) - insulin aspart protamine - aspart (NOVOLOG MIX 70/30 FLEXPEN) (70-30) 100 UNIT/ML FlexPen; Inject subcutaneously 35 units in the morning and 25 units in the evening  Dispense: 30 mL; Refill: 6  2. Folliculitis Placed on doxycycline - Ambulatory referral to Dermatology - doxycycline (VIBRA-TABS) 100 MG tablet; Take 1 tablet (100 mg total) by mouth 2 (two) times daily.  Dispense: 20 tablet; Refill: 0  3. Puncture wound Tdap administered    Charlott Rakes, MD, FAAFP. [facility], [place]   11/16/2019, 11:40 AM

## 2019-11-29 ENCOUNTER — Ambulatory Visit: Payer: Medicaid Other | Admitting: Family Medicine

## 2020-01-03 ENCOUNTER — Other Ambulatory Visit: Payer: Self-pay | Admitting: Family Medicine

## 2020-01-03 DIAGNOSIS — Z794 Long term (current) use of insulin: Secondary | ICD-10-CM

## 2020-02-12 ENCOUNTER — Other Ambulatory Visit: Payer: Self-pay | Admitting: Family Medicine

## 2020-02-12 NOTE — Telephone Encounter (Signed)
Patient requesting PCP to refill all medications (patient states she does not know the name of medication) but albuterol (PROVENTIL HFA;VENTOLIN HFA) 108 (90 Base) MCG/ACT inhaler  Informed patient in the future PCP requires the name of each medication. Patient contacted pharmacy and was advised to reach out to PCP. Patient scheduled follow up with PCP for 04/02/2020. Patient was dx with COVID 8/11 and really needs are her inhaler which is running out of   [facility] - [place], [place] - [address] Phone:  [phone]  Fax:  [phone]

## 2020-02-12 NOTE — Telephone Encounter (Signed)
Requested medication (s) are due for refill today:  Yes  Requested medication (s) are on the active medication list:  Yes  Future visit scheduled:  Yes  Last Refill: 09/12/17; #1 inhaler/ RF x 3  Notes to clinic: This medication was prescribed in 08/2017 by an ER Physician for COPD exacerbation.  Pt. Is requesting refills; please advised.  Requested Prescriptions  Pending Prescriptions Disp Refills   albuterol (VENTOLIN HFA) 108 (90 Base) MCG/ACT inhaler      Sig: Inhale 2 puffs into the lungs every 4 (four) hours as needed for wheezing or shortness of breath.      Pulmonology:  Beta Agonists Failed - 02/12/2020 11:41 AM      Failed - One inhaler should last at least one month. If the patient is requesting refills earlier, contact the patient to check for uncontrolled symptoms.      Passed - Valid encounter within last 12 months    Recent Outpatient Visits           2 months ago Type 2 diabetes mellitus with hyperglycemia, with long-term current use of insulin (HCC)   [facility] [place], [place], MD   6 months ago Type 2 diabetes mellitus with hyperglycemia, with long-term current use of insulin (HCC)   [facility] Hoy Register, MD   1 year ago Irritable bowel syndrome with diarrhea   [facility] [place], Odette Horns, MD   1 year ago Type 2 diabetes mellitus with hyperglycemia, with long-term current use of insulin (HCC)   [facility] Hoy Register, MD   1 year ago Irritable bowel syndrome with diarrhea   [facility] Hoy Register, MD       Future Appointments             In 1 month Hoy Register, MD [facility]

## 2020-02-14 MED ORDER — ALBUTEROL SULFATE HFA 108 (90 BASE) MCG/ACT IN AERS: 2.0000 | INHALATION_SPRAY | RESPIRATORY_TRACT | 2 refills | Status: DC | PRN

## 2020-02-19 ENCOUNTER — Other Ambulatory Visit: Payer: Self-pay | Admitting: Family Medicine

## 2020-02-19 DIAGNOSIS — H8113 Benign paroxysmal vertigo, bilateral: Secondary | ICD-10-CM

## 2020-02-19 DIAGNOSIS — Z8719 Personal history of other diseases of the digestive system: Secondary | ICD-10-CM

## 2020-02-19 DIAGNOSIS — E1165 Type 2 diabetes mellitus with hyperglycemia: Secondary | ICD-10-CM

## 2020-02-19 DIAGNOSIS — F419 Anxiety disorder, unspecified: Secondary | ICD-10-CM

## 2020-02-19 DIAGNOSIS — K580 Irritable bowel syndrome with diarrhea: Secondary | ICD-10-CM

## 2020-02-19 DIAGNOSIS — I10 Essential (primary) hypertension: Secondary | ICD-10-CM

## 2020-02-19 MED ORDER — DICYCLOMINE HCL 10 MG PO CAPS: 10.0000 mg | ORAL_CAPSULE | Freq: Three times a day (TID) | ORAL | 0 refills | Status: DC

## 2020-02-19 MED ORDER — SURE COMFORT PEN NEEDLES 31G X 5 MM MISC: 5 refills | Status: DC

## 2020-02-19 MED ORDER — ESOMEPRAZOLE MAGNESIUM 20 MG PO CPDR: 20.0000 mg | DELAYED_RELEASE_CAPSULE | Freq: Every day | ORAL | 0 refills | Status: DC

## 2020-02-19 MED ORDER — FLUOXETINE HCL 40 MG PO CAPS: 40.0000 mg | ORAL_CAPSULE | Freq: Every day | ORAL | 0 refills | Status: DC

## 2020-02-19 MED ORDER — ATORVASTATIN CALCIUM 20 MG PO TABS: 20.0000 mg | ORAL_TABLET | Freq: Every day | ORAL | 0 refills | Status: DC

## 2020-02-19 MED ORDER — MECLIZINE HCL 25 MG PO TABS: 25.0000 mg | ORAL_TABLET | Freq: Three times a day (TID) | ORAL | 1 refills | Status: DC | PRN

## 2020-02-19 MED ORDER — FENOFIBRATE 48 MG PO TABS: 48.0000 mg | ORAL_TABLET | Freq: Every day | ORAL | 0 refills | Status: DC

## 2020-02-19 MED ORDER — GLIMEPIRIDE 4 MG PO TABS: 8.0000 mg | ORAL_TABLET | Freq: Every day | ORAL | 1 refills | Status: DC

## 2020-02-19 MED ORDER — METOPROLOL SUCCINATE ER 25 MG PO TB24: 25.0000 mg | ORAL_TABLET | Freq: Every day | ORAL | 0 refills | Status: DC

## 2020-02-19 NOTE — Telephone Encounter (Signed)
Medication Refill - Medication: dicyclomine, esomeprazole, fenofibrate, fluoxetine, glimepiride, hydroxyzine, lantus solastar pen, meclizine, metoprolol, sure comfort pen needles, technetium tetrofosmin, albuterol   Has the patient contacted their pharmacy? Yes.   Pt states that she is out of some of these medications and is needing them refilled ASAP. Please advise. (Agent: If no, request that the patient contact the pharmacy for the refill.) (Agent: If yes, when and what did the pharmacy advise?)  Preferred Pharmacy (with phone number or street name):  [facility] - [place], [place] - [address]  [address] [place] [address]  Phone: [phone] Fax: [phone]  Hours: Not open 24 hours     Agent: Please be advised that RX refills may take up to 3 business days. We ask that you follow-up with your pharmacy.

## 2020-02-19 NOTE — Telephone Encounter (Signed)
Requested medication (s) are due for refill today: no  Requested medication (s) are on the active medication list: yes  Last refill:  07/30/2019  Future visit scheduled: yes  Notes to clinic:  this refill cannot be delegated    Requested Prescriptions  Pending Prescriptions Disp Refills   meclizine (ANTIVERT) 25 MG tablet 180 tablet 1    Sig: Take 1 tablet (25 mg total) by mouth 3 (three) times daily as needed for dizziness.      Not Delegated - Gastroenterology: Antiemetics Failed - 02/19/2020  1:37 PM      Failed - This refill cannot be delegated      Passed - Valid encounter within last 6 months    Recent Outpatient Visits           3 months ago Type 2 diabetes mellitus with hyperglycemia, with long-term current use of insulin ([place])   [place], [place], MD   6 months ago Type 2 diabetes mellitus with hyperglycemia, with long-term current use of insulin ([place])   [place], Enobong, MD   1 year ago Irritable bowel syndrome with diarrhea   [place], Charlane Ferretti, MD   1 year ago Type 2 diabetes mellitus with hyperglycemia, with long-term current use of insulin ([place])   [place], Enobong, MD   1 year ago Irritable bowel syndrome with diarrhea   [place], Charlane Ferretti, MD       Future Appointments             In 1 month Charlott Rakes, MD [place]             Signed Prescriptions Disp Refills   dicyclomine (BENTYL) 10 MG capsule 270 capsule 0    Sig: Take 1 capsule (10 mg total) by mouth 3 (three) times daily before meals.      Gastroenterology:  Antispasmodic Agents Passed - 02/19/2020  1:37 PM      Passed - Last Heart Rate in normal range    Pulse Readings from Last 1 Encounters:  11/16/19 79          Passed - Valid encounter within last 12  months    Recent Outpatient Visits           3 months ago Type 2 diabetes mellitus with hyperglycemia, with long-term current use of insulin ([place])   [place]illin, [place], MD   6 months ago Type 2 diabetes mellitus with hyperglycemia, with long-term current use of insulin ([place])   [place], Enobong, MD   1 year ago Irritable bowel syndrome with diarrhea   [place], Charlane Ferretti, MD   1 year ago Type 2 diabetes mellitus with hyperglycemia, with long-term current use of insulin ([place])   [place], Enobong, MD   1 year ago Irritable bowel syndrome with diarrhea   [place], Enobong, MD       Future Appointments             In 1 month Charlott Rakes, MD [place]              esomeprazole (NEXIUM) 20 MG capsule 90 capsule 0    Sig: Take 1  capsule (20 mg total) by mouth daily.      Gastroenterology: Proton Pump Inhibitors Passed - 02/19/2020  1:37 PM      Passed - Valid encounter within last 12 months    Recent Outpatient Visits           3 months ago Type 2 diabetes mellitus with hyperglycemia, with long-term current use of insulin ([place])   [place], Sonoita, MD   6 months ago Type 2 diabetes mellitus with hyperglycemia, with long-term current use of insulin ([place])   [place], Enobong, MD   1 year ago Irritable bowel syndrome with diarrhea   [place], Charlane Ferretti, MD   1 year ago Type 2 diabetes mellitus with hyperglycemia, with long-term current use of insulin ([place])   [place], Enobong, MD   1 year ago Irritable bowel syndrome with diarrhea   [place], Charlane Ferretti, MD        Future Appointments             In 1 month Charlott Rakes, MD [place]              fenofibrate (TRICOR) 48 MG tablet 90 tablet 0    Sig: Take 1 tablet (48 mg total) by mouth daily.      Cardiovascular:  Antilipid - Fibric Acid Derivatives Failed - 02/19/2020  1:37 PM      Failed - LDL in normal range and within 360 days    LDL Chol Calc (NIH)  Date Value Ref Range Status  07/31/2019 77 0 - 99 mg/dL Final          Failed - HDL in normal range and within 360 days    HDL  Date Value Ref Range Status  07/31/2019 27 (L) >39 mg/dL Final          Failed - Triglycerides in normal range and within 360 days    Triglycerides  Date Value Ref Range Status  07/31/2019 287 (H) 0 - 149 mg/dL Final          Failed - ALT in normal range and within 180 days    ALT  Date Value Ref Range Status  07/31/2019 52 (H) 0 - 32 IU/L Final          Failed - AST in normal range and within 180 days    AST  Date Value Ref Range Status  07/31/2019 46 (H) 0 - 40 IU/L Final          Failed - Cr in normal range and within 180 days    Creat  Date Value Ref Range Status  07/05/2016 0.59 0.50 - 1.05 mg/dL Final    Comment:      For patients > or = 58 years of age: The upper reference limit for Creatinine is approximately 13% higher for people identified as African-American.      Creatinine, Ser  Date Value Ref Range Status  07/31/2019 0.55 (L) 0.57 - 1.00 mg/dL Final          Failed - eGFR in normal range and within 180 days    GFR, Est African American  Date Value Ref Range Status  07/05/2016 >89 >=60 mL/min Final   GFR calc Af Amer  Date Value Ref Range Status  07/31/2019 120 >59 mL/min/1.73 Final   GFR,  Est Non African American  Date Value Ref Range Status  07/05/2016 >89 >=60 mL/min Final   GFR calc non Af Amer  Date Value Ref Range Status  07/31/2019 104 >59 mL/min/1.73 Final          Passed - Total Cholesterol in normal range  and within 360 days    Cholesterol, Total  Date Value Ref Range Status  07/31/2019 151 100 - 199 mg/dL Final          Passed - Valid encounter within last 12 months    Recent Outpatient Visits           3 months ago Type 2 diabetes mellitus with hyperglycemia, with long-term current use of insulin ([place])   [place]aker, [place] Valley, MD   6 months ago Type 2 diabetes mellitus with hyperglycemia, with long-term current use of insulin ([place])   [place], Charlane Ferretti, MD   1 year ago Irritable bowel syndrome with diarrhea   [place], [place], MD   1 year ago Type 2 diabetes mellitus with hyperglycemia, with long-term current use of insulin ([place])   [place], Enobong, MD   1 year ago Irritable bowel syndrome with diarrhea   [place], Charlane Ferretti, MD       Future Appointments             In 1 month Charlott Rakes, MD [place]              FLUoxetine (PROZAC) 40 MG capsule 90 capsule 0    Sig: Take 1 capsule (40 mg total) by mouth at bedtime.      Psychiatry:  Antidepressants - SSRI Failed - 02/19/2020  1:37 PM      Failed - Completed PHQ-2 or PHQ-9 in the last 360 days.      Passed - Valid encounter within last 6 months    Recent Outpatient Visits           3 months ago Type 2 diabetes mellitus with hyperglycemia, with long-term current use of insulin ([place])   [place]en, [place], MD   6 months ago Type 2 diabetes mellitus with hyperglycemia, with long-term current use of insulin ([place])   [place], [place], MD   1 year ago Irritable bowel syndrome with diarrhea   [place], Charlane Ferretti, MD   1 year ago Type 2 diabetes mellitus with hyperglycemia, with  long-term current use of insulin ([place])   [place], Charlane Ferretti, MD   1 year ago Irritable bowel syndrome with diarrhea   [place], Charlane Ferretti, MD       Future Appointments             In 1 month Charlott Rakes, MD [place]              glimepiride (AMARYL) 4 MG tablet 180 tablet 1    Sig: Take 2 tablets (8 mg total) by mouth daily with breakfast.      Endocrinology:  Diabetes - Sulfonylureas Failed - 02/19/2020  1:37 PM      Failed - HBA1C is between 0 and 7.9 and within 180 days    HbA1c, POC (controlled diabetic range)  Date Value Ref Range Status  11/16/2019 12.4 (A) 0.0 - 7.0 % Final          Passed - Valid encounter within last 6 months    Recent Outpatient Visits           3 months ago Type 2 diabetes mellitus with hyperglycemia, with long-term current use of insulin ([place])   [place], Souris, MD   6 months ago Type 2 diabetes mellitus with hyperglycemia, with long-term current use of insulin ([place])   [place], Enobong, MD   1 year ago Irritable bowel syndrome with diarrhea   [place], Charlane Ferretti, MD   1 year ago Type 2 diabetes mellitus with hyperglycemia, with long-term current use of insulin ([place])   [place], Enobong, MD   1 year ago Irritable bowel syndrome with diarrhea   [place], Charlane Ferretti, MD       Future Appointments             In 1 month Charlott Rakes, MD [place]              atorvastatin (LIPITOR) 20 MG tablet 90 tablet 0    Sig: Take 1 tablet (20 mg total) by mouth daily.      Cardiovascular:  Antilipid - Statins Failed - 02/19/2020  1:37 PM      Failed - LDL in normal range and within 360 days    LDL Chol  Calc (NIH)  Date Value Ref Range Status  07/31/2019 77 0 - 99 mg/dL Final          Failed - HDL in normal range and within 360 days    HDL  Date Value Ref Range Status  07/31/2019 27 (L) >39 mg/dL Final          Failed - Triglycerides in normal range and within 360 days    Triglycerides  Date Value Ref Range Status  07/31/2019 287 (H) 0 - 149 mg/dL Final          Passed - Total Cholesterol in normal range and within 360 days    Cholesterol, Total  Date Value Ref Range Status  07/31/2019 151 100 - 199 mg/dL Final          Passed - Patient is not pregnant      Passed - Valid encounter within last 12 months    Recent Outpatient Visits           3 months ago Type 2 diabetes mellitus with hyperglycemia, with long-term current use of insulin ([place])   Morrison, [place], MD   6 months ago Type 2 diabetes mellitus with hyperglycemia, with long-term current use of insulin ([place])   [place], Charlane Ferretti, MD   1 year ago Irritable bowel syndrome with diarrhea   [place], [place], MD   1 year ago Type 2 diabetes mellitus with hyperglycemia, with long-term current use of insulin ([place])   Leith, Enobong, MD   1 year ago Irritable bowel syndrome with diarrhea   [place]iss, Enobong, MD       Future Appointments             In 1 month  Charlott Rakes, MD [place]              metoprolol succinate (TOPROL-XL) 25 MG 24 hr tablet 90 tablet 0    Sig: Take 1 tablet (25 mg total) by mouth daily.      Cardiovascular:  Beta Blockers Passed - 02/19/2020  1:37 PM      Passed - Last BP in normal range    BP Readings from Last 1 Encounters:  11/16/19 135/79          Passed - Last Heart Rate in normal range    Pulse Readings from Last 1 Encounters:   11/16/19 79          Passed - Valid encounter within last 6 months    Recent Outpatient Visits           3 months ago Type 2 diabetes mellitus with hyperglycemia, with long-term current use of insulin ([place])   [place], Hastings, MD   6 months ago Type 2 diabetes mellitus with hyperglycemia, with long-term current use of insulin ([place])   [place], Charlane Ferretti, MD   1 year ago Irritable bowel syndrome with diarrhea   [place]rod, S[place], MD   1 year ago Type 2 diabetes mellitus with hyperglycemia, with long-term current use of insulin ([place])   [place], Enobong, MD   1 year ago Irritable bowel syndrome with diarrhea   [place] Charlott Rakes, MD       Future Appointments             In 1 month Charlott Rakes, MD [place]              Insulin Pen Needle (SURE COMFORT PEN NEEDLES) 31G X 5 MM MISC 100 each 5    Sig: USE ONCE A DAY      Endocrinology: Diabetes - Testing Supplies Passed - 02/19/2020  1:37 PM      Passed - Valid encounter within last 12 months    Recent Outpatient Visits           3 months ago Type 2 diabetes mellitus with hyperglycemia, with long-term current use of insulin ([place])   [place], Stacyville, MD   6 months ago Type 2 diabetes mellitus with hyperglycemia, with long-term current use of insulin ([place])   Franquez, Enobong, MD   1 year ago Irritable bowel syndrome with diarrhea   [place], Charlane Ferretti, MD   1 year ago Type 2 diabetes mellitus with hyperglycemia, with long-term current use of insulin ([place])   McCleary, Enobong, MD   1 year ago Irritable bowel syndrome with diarrhea   [place], MD       Future Appointments             In 1 month Charlott Rakes, MD [place]

## 2020-02-28 ENCOUNTER — Other Ambulatory Visit: Payer: Self-pay | Admitting: Family Medicine

## 2020-02-28 DIAGNOSIS — E119 Type 2 diabetes mellitus without complications: Secondary | ICD-10-CM

## 2020-02-28 DIAGNOSIS — M549 Dorsalgia, unspecified: Secondary | ICD-10-CM

## 2020-02-28 NOTE — Telephone Encounter (Signed)
Requested medication (s) are due for refill today: yes  Requested medication (s) are on the active medication list: yes  Last refill: 11/16/19  #90  3 refills  Future visit scheduled: yes  Notes to clinic:  Not delegated    Requested Prescriptions  Pending Prescriptions Disp Refills   tiZANidine (ZANAFLEX) 4 MG tablet [Pharmacy Med Name: tizanidine 4 mg tablet] 90 tablet 3    Sig: Take 1 tablet (4 mg total) by mouth every 8 (eight) hours as needed for muscle spasms.      Not Delegated - Cardiovascular:  Alpha-2 Agonists - tizanidine Failed - 02/28/2020 12:32 PM      Failed - This refill cannot be delegated      Passed - Valid encounter within last 6 months    Recent Outpatient Visits           3 months ago Type 2 diabetes mellitus with hyperglycemia, with long-term current use of insulin (HCC)   [facility] [place], [place], MD   7 months ago Type 2 diabetes mellitus with hyperglycemia, with long-term current use of insulin (HCC)   [facility] Hoy Register, MD   1 year ago Irritable bowel syndrome with diarrhea   [facility] [place], Odette Horns, MD   1 year ago Type 2 diabetes mellitus with hyperglycemia, with long-term current use of insulin (HCC)   [facility] Hoy Register, MD   1 year ago Irritable bowel syndrome with diarrhea   [facility] Hoy Register, MD       Future Appointments             In 1 month Hoy Register, MD [facility]             Signed Prescriptions Disp Refills   ACCU-CHEK AVIVA PLUS test strip 100 strip 6    Sig: USE ONE TEST SSTRIP FOR BLOOD SUGAR FOUR TIMES DAILY      Endocrinology: Diabetes - Testing Supplies Passed - 02/28/2020 12:32 PM      Passed - Valid encounter within last 12 months    Recent Outpatient Visits           3 months ago Type 2 diabetes  mellitus with hyperglycemia, with long-term current use of insulin (HCC)   [facility] [place], [place], MD   7 months ago Type 2 diabetes mellitus with hyperglycemia, with long-term current use of insulin (HCC)   [facility] Hoy Register, MD   1 year ago Irritable bowel syndrome with diarrhea   [facility] [place], Odette Horns, MD   1 year ago Type 2 diabetes mellitus with hyperglycemia, with long-term current use of insulin (HCC)   [facility] Hoy Register, MD   1 year ago Irritable bowel syndrome with diarrhea   [place] [facility] Hoy Register, MD       Future Appointments             In 1 month Hoy Register, MD [facility]

## 2020-02-28 NOTE — Telephone Encounter (Signed)
Requested Prescriptions  Pending Prescriptions Disp Refills   ACCU-CHEK AVIVA PLUS test strip [Pharmacy Med Name: Accu-Chek Aviva Plus test strips] 100 strip 6    Sig: USE ONE TEST SSTRIP FOR BLOOD SUGAR FOUR TIMES DAILY     Endocrinology: Diabetes - Testing Supplies Passed - 02/28/2020 12:32 PM      Passed - Valid encounter within last 12 months    Recent Outpatient Visits          3 months ago Type 2 diabetes mellitus with hyperglycemia, with long-term current use of insulin (HCC)   [facility] [place], [place], MD   7 months ago Type 2 diabetes mellitus with hyperglycemia, with long-term current use of insulin (HCC)   [facility] [place], Odette Horns, MD   1 year ago Irritable bowel syndrome with diarrhea   [facility] [place], Edgar, MD   1 year ago Type 2 diabetes mellitus with hyperglycemia, with long-term current use of insulin (HCC)   [facility] [place], [place], MD   1 year ago Irritable bowel syndrome with diarrhea   [facility] [place], Odette Horns, MD      Future Appointments            In 1 month Hoy Register, MD [facility]            tiZANidine (ZANAFLEX) 4 MG tablet [Pharmacy Med Name: tizanidine 4 mg tablet] 90 tablet 3    Sig: Take 1 tablet (4 mg total) by mouth every 8 (eight) hours as needed for muscle spasms.     Not Delegated - Cardiovascular:  Alpha-2 Agonists - tizanidine Failed - 02/28/2020 12:32 PM      Failed - This refill cannot be delegated      Passed - Valid encounter within last 6 months    Recent Outpatient Visits          3 months ago Type 2 diabetes mellitus with hyperglycemia, with long-term current use of insulin (HCC)   [facility] [place], [place], MD   7 months ago Type 2 diabetes mellitus with hyperglycemia, with long-term current use of  insulin (HCC)   [facility] Hoy Register, MD   1 year ago Irritable bowel syndrome with diarrhea   [facility] [place], Odette Horns, MD   1 year ago Type 2 diabetes mellitus with hyperglycemia, with long-term current use of insulin (HCC)   [place] [facility] Hoy Register, MD   1 year ago Irritable bowel syndrome with diarrhea   [place] [facility] Hoy Register, MD      Future Appointments            In 1 month Hoy Register, MD [facility]

## 2020-03-12 ENCOUNTER — Other Ambulatory Visit: Payer: Self-pay | Admitting: Family Medicine

## 2020-03-12 DIAGNOSIS — Z8719 Personal history of other diseases of the digestive system: Secondary | ICD-10-CM

## 2020-03-12 DIAGNOSIS — Z794 Long term (current) use of insulin: Secondary | ICD-10-CM

## 2020-03-12 DIAGNOSIS — F419 Anxiety disorder, unspecified: Secondary | ICD-10-CM

## 2020-03-12 DIAGNOSIS — I10 Essential (primary) hypertension: Secondary | ICD-10-CM

## 2020-03-20 ENCOUNTER — Other Ambulatory Visit: Payer: Self-pay | Admitting: Family Medicine

## 2020-03-20 DIAGNOSIS — I10 Essential (primary) hypertension: Secondary | ICD-10-CM

## 2020-03-20 DIAGNOSIS — F419 Anxiety disorder, unspecified: Secondary | ICD-10-CM

## 2020-03-20 DIAGNOSIS — E1165 Type 2 diabetes mellitus with hyperglycemia: Secondary | ICD-10-CM

## 2020-03-20 DIAGNOSIS — Z8719 Personal history of other diseases of the digestive system: Secondary | ICD-10-CM

## 2020-03-20 NOTE — Telephone Encounter (Signed)
Attempted to notify pt to contact pharmacy for refills; left message on voicemal;

## 2020-03-20 NOTE — Telephone Encounter (Signed)
Refill requests for metoprolol, atorvastatin, fluoxetine, and esomeprazole all filled 02/19/20, # 90; contacted pharmacy to verify dates of refill; spoke with Doristine Mango, pharmacist; he states the pt did receive a 90 day supply medication, but she would like refills for next time; scripts written to expire 02/18/21; will notify pt to contact her pharmacy for refills

## 2020-03-28 ENCOUNTER — Other Ambulatory Visit: Payer: Self-pay | Admitting: Family Medicine

## 2020-03-28 DIAGNOSIS — Z794 Long term (current) use of insulin: Secondary | ICD-10-CM

## 2020-03-28 DIAGNOSIS — Z8719 Personal history of other diseases of the digestive system: Secondary | ICD-10-CM

## 2020-03-28 DIAGNOSIS — I10 Essential (primary) hypertension: Secondary | ICD-10-CM

## 2020-03-28 DIAGNOSIS — F419 Anxiety disorder, unspecified: Secondary | ICD-10-CM

## 2020-04-02 ENCOUNTER — Ambulatory Visit: Payer: Medicaid Other | Attending: Family Medicine | Admitting: Family Medicine

## 2020-04-02 ENCOUNTER — Encounter: Payer: Self-pay | Admitting: Family Medicine

## 2020-04-02 ENCOUNTER — Other Ambulatory Visit: Payer: Self-pay

## 2020-04-02 VITALS — BP 125/79 | HR 88 | Ht 67.0 in | Wt 158.0 lb

## 2020-04-02 DIAGNOSIS — I152 Hypertension secondary to endocrine disorders: Secondary | ICD-10-CM

## 2020-04-02 DIAGNOSIS — Z8719 Personal history of other diseases of the digestive system: Secondary | ICD-10-CM

## 2020-04-02 DIAGNOSIS — F419 Anxiety disorder, unspecified: Secondary | ICD-10-CM

## 2020-04-02 DIAGNOSIS — B373 Candidiasis of vulva and vagina: Secondary | ICD-10-CM

## 2020-04-02 DIAGNOSIS — E1159 Type 2 diabetes mellitus with other circulatory complications: Secondary | ICD-10-CM

## 2020-04-02 DIAGNOSIS — Z794 Long term (current) use of insulin: Secondary | ICD-10-CM

## 2020-04-02 DIAGNOSIS — B3731 Acute candidiasis of vulva and vagina: Secondary | ICD-10-CM

## 2020-04-02 DIAGNOSIS — L739 Follicular disorder, unspecified: Secondary | ICD-10-CM

## 2020-04-02 DIAGNOSIS — E1142 Type 2 diabetes mellitus with diabetic polyneuropathy: Secondary | ICD-10-CM

## 2020-04-02 DIAGNOSIS — I110 Hypertensive heart disease with heart failure: Secondary | ICD-10-CM

## 2020-04-02 DIAGNOSIS — M7501 Adhesive capsulitis of right shoulder: Secondary | ICD-10-CM

## 2020-04-02 DIAGNOSIS — K580 Irritable bowel syndrome with diarrhea: Secondary | ICD-10-CM

## 2020-04-02 DIAGNOSIS — I5089 Other heart failure: Secondary | ICD-10-CM

## 2020-04-02 DIAGNOSIS — E1165 Type 2 diabetes mellitus with hyperglycemia: Secondary | ICD-10-CM

## 2020-04-02 DIAGNOSIS — M5432 Sciatica, left side: Secondary | ICD-10-CM

## 2020-04-02 LAB — POCT GLYCOSYLATED HEMOGLOBIN (HGB A1C): HbA1c, POC (controlled diabetic range): 11.6 % — AB (ref 0.0–7.0)

## 2020-04-02 LAB — GLUCOSE, POCT (MANUAL RESULT ENTRY): POC Glucose: 186 mg/dl — AB (ref 70–99)

## 2020-04-02 MED ORDER — HYDROXYZINE HCL 10 MG PO TABS: 10.0000 mg | ORAL_TABLET | Freq: Three times a day (TID) | ORAL | 3 refills | Status: DC | PRN

## 2020-04-02 MED ORDER — FLUCONAZOLE 150 MG PO TABS: 150.0000 mg | ORAL_TABLET | Freq: Once | ORAL | 0 refills | Status: AC

## 2020-04-02 MED ORDER — ESOMEPRAZOLE MAGNESIUM 20 MG PO CPDR: 20.0000 mg | DELAYED_RELEASE_CAPSULE | Freq: Every day | ORAL | 1 refills | Status: DC

## 2020-04-02 MED ORDER — METOPROLOL SUCCINATE ER 25 MG PO TB24: 25.0000 mg | ORAL_TABLET | Freq: Every day | ORAL | 1 refills | Status: DC

## 2020-04-02 MED ORDER — FLUOXETINE HCL 40 MG PO CAPS: 40.0000 mg | ORAL_CAPSULE | Freq: Every day | ORAL | 1 refills | Status: DC

## 2020-04-02 MED ORDER — DIPHENOXYLATE-ATROPINE 2.5-0.025 MG PO TABS: 1.0000 | ORAL_TABLET | Freq: Four times a day (QID) | ORAL | 2 refills | Status: DC | PRN

## 2020-04-02 MED ORDER — NOVOLOG MIX 70/30 FLEXPEN (70-30) 100 UNIT/ML ~~LOC~~ SUPN: PEN_INJECTOR | SUBCUTANEOUS | 6 refills | Status: DC

## 2020-04-02 MED ORDER — ATORVASTATIN CALCIUM 40 MG PO TABS: 40.0000 mg | ORAL_TABLET | Freq: Every day | ORAL | 1 refills | Status: DC

## 2020-04-02 MED ORDER — GABAPENTIN 300 MG PO CAPS: 300.0000 mg | ORAL_CAPSULE | Freq: Two times a day (BID) | ORAL | 3 refills | Status: DC

## 2020-04-02 MED ORDER — DICYCLOMINE HCL 10 MG PO CAPS: 10.0000 mg | ORAL_CAPSULE | Freq: Three times a day (TID) | ORAL | 1 refills | Status: DC

## 2020-04-02 NOTE — Progress Notes (Signed)
Subjective:  Patient ID: [name], female    DOB: 1960-12-07  Age: 59 y.o. MRN: [id number]  CC: Diabetes   HPI [name] is a 59 year old female with a history of type 2 diabetes mellitus (A1c 11.6), hypertension, CHF (EF 55-60% 2-D echo of 09/2016 which has improved from 25%-30% previously), anxiety, GERD, COVID-19 in 01/2020 who presents today for follow-up visit. Nuclear stress test from 07/2017 revealed EF of 51%, no ST deviation, low risk study. Last seen by the cardiology PA in 08/2019  With regards to her diabetes mellitus she has not been compliant with a diabetic diet but endorses compliance with her medications [place] sugars are 150 -200.  She endorses neuropathy but has not been taking her gabapentin.  Requests Lomotil which he uses in addition to Bentyl for her IBS.  I had referred her to GI however she never received an appointment.  Complains of a R groin swelling which she noticed a couple weeks ago but has now resolved.  Complains of vaginal itching and burning  She is requesting hydroxyzine as her Prozac has been insufficient with regards to her anxiety. Lost bofriend 4 months ago then daughter had baby 3 months ago and had complications postpartum then she had COVID-19 shortly after.  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  She did not present to the hospital when she had COVID-19 as she was afraid they would admit her and she needed to be present to help her daughter and grandchildren. Request referral to dermatology for recurrent facial folliculitis.  Review of her chart indicates referral was closed as [facility] dermatology was unable to reach the patient.  She complains of right shoulder stiffness and left leg pain that radiates from her buttock down her left lower extremity. Past Medical History:  Diagnosis Date  . Anxiety   . Arthritis   . Chronic systolic heart failure (HCC)    EF 20-25% ECHO 05/2015  . COPD (chronic  obstructive pulmonary disease) ([place])   . Depression   . Diabetes mellitus without complication ([place])    Type II  . Dysrhythmia    pt unsure of name of arrythmia - " my heart rate will drop all of a sudden" -no current treatment - atrial flutter  . Fibrillation, atrial ([place])   . Gallstones   . GERD (gastroesophageal reflux disease)   . Headache(784.0)    migraines  . Hypertension   . Osteomyelitis (HCC)    R ankle  . Rash    arms    Past Surgical History:  Procedure Laterality Date  . ANKLE FRACTURE SURGERY Right 2015  . CARDIAC CATHETERIZATION N/A 06/24/2015   Procedure: Left Heart Cath and Coronary Angiography;  Surgeon: Belva Crome, MD;  Location: [facility];  Service: Cardiovascular;  Laterality: N/A;  . CARPAL TUNNEL RELEASE     bil  . CHOLECYSTECTOMY  11/24/2011   Procedure: LAPAROSCOPIC CHOLECYSTECTOMY WITH INTRAOPERATIVE CHOLANGIOGRAM;  Surgeon: Joyice Faster. Cornett, MD;  Location: [facility];  Service: General;  Laterality: N/A;  Laparoscopic Cholecystectomy with Cholangiogram  . COLONOSCOPY    . ECTOPIC PREGNANCY SURGERY  many yrs ago  . HARDWARE REMOVAL Right 07/15/2015   Procedure: RIGHT ANKLE HARDWARE REMOVAL, PLACEMENT OF STIMULAN ANTIBIOTIC BEADS AND PREVENA WOUND VAC.;  Surgeon: Meredith Pel, MD;  Location: [facility];  Service: Orthopedics;  Laterality: Right;  . TOOTH EXTRACTION N/A 12/03/2016   Procedure: DENTAL EXTRACTIONS with Alveoloplasty;  Surgeon: Diona Browner, DDS;  Location: [facility];  Service: Oral Surgery;  Laterality: N/A;    Family History  Problem Relation Age of Onset  . Lung cancer Father   . Heart attack Father 51       CABG x4  . Breast cancer Sister        Survivor     Allergies  Allergen Reactions  . Biaxin [Clarithromycin] Anaphylaxis and Swelling  . Clarithromycin Shortness Of Breath and Swelling  . Lisinopril Swelling and Cough    Lip edema  . Sulfa Antibiotics Anaphylaxis, Shortness Of Breath, Swelling and Hypertension  .  Chlorthalidone Nausea And Vomiting and Other (See Comments)    Clammy, Tachycardia, Headache   . Daptomycin Nausea And Vomiting  . Latex Hives and Rash  . Amoxicillin-Pot Clavulanate Diarrhea    Has patient had a PCN reaction causing immediate rash, facial/tongue/throat swelling, SOB or lightheadedness with hypotension:No Has patient had a PCN reaction causing severe rash involving mucus membranes or skin necrosis:No Has patient had a PCN reaction that required hospitalization:No Has patient had a PCN reaction occurring within THE LAST 10 YEARS.  #  #  #  YES  #  #  #  If all of the above answers are "NO", then may proceed with Cephalosporin use.   . Aspirin Nausea And Vomiting and Rash    On $R'325mg'GW$  dosage  . Cholestyramine Nausea And Vomiting  . Dilaudid [Hydromorphone Hcl] Nausea And Vomiting  . Lasix [Furosemide] Nausea And Vomiting and Other (See Comments)    Headache     Outpatient Medications Prior to Visit  Medication Sig Dispense Refill  . ACCU-CHEK AVIVA PLUS test strip USE ONE TEST SSTRIP FOR BLOOD SUGAR FOUR TIMES DAILY 100 strip 6  . albuterol (VENTOLIN HFA) 108 (90 Base) MCG/ACT inhaler Inhale 2 puffs into the lungs every 4 (four) hours as needed for wheezing or shortness of breath. 8.5 g 2  . Blood Glucose Monitoring Suppl (ACCU-CHEK GUIDE) w/Device KIT 1 each by Does not apply route 4 (four) times daily. 1 kit 0  . glimepiride (AMARYL) 4 MG tablet Take 2 tablets (8 mg total) by mouth daily with breakfast. 180 tablet 1  . Insulin Pen Needle (SURE COMFORT PEN NEEDLES) 31G X 5 MM MISC USE ONCE A DAY 100 each 5  . Lancet Devices (ACCU-CHEK SOFTCLIX) lancets Use as instructed daily. 1 each 5  . meclizine (ANTIVERT) 25 MG tablet Take 1 tablet (25 mg total) by mouth 3 (three) times daily as needed for dizziness. 180 tablet 1  . atorvastatin (LIPITOR) 20 MG tablet Take 1 tablet (20 mg total) by mouth daily. 90 tablet 0  . dicyclomine (BENTYL) 10 MG capsule Take 1 capsule (10 mg  total) by mouth 3 (three) times daily before meals. 270 capsule 0  . esomeprazole (NEXIUM) 20 MG capsule Take 1 capsule (20 mg total) by mouth daily. 90 capsule 0  . fenofibrate (TRICOR) 48 MG tablet Take 1 tablet (48 mg total) by mouth daily. 90 tablet 0  . FLUoxetine (PROZAC) 40 MG capsule Take 1 capsule (40 mg total) by mouth at bedtime. 90 capsule 0  . insulin aspart protamine - aspart (NOVOLOG MIX 70/30 FLEXPEN) (70-30) 100 UNIT/ML FlexPen Inject subcutaneously 35 units in the morning and 25 units in the evening 30 mL 6  . metoprolol succinate (TOPROL-XL) 25 MG 24 hr tablet Take 1 tablet (25 mg total) by mouth daily. 90 tablet 0  . tiZANidine (ZANAFLEX) 4 MG tablet Take 1 tablet (4 mg  total) by mouth every 8 (eight) hours as needed for muscle spasms. 90 tablet 3  . doxycycline (VIBRA-TABS) 100 MG tablet Take 1 tablet (100 mg total) by mouth 2 (two) times daily. (Patient not taking: Reported on 04/02/2020) 20 tablet 0  . tiotropium (SPIRIVA) 18 MCG inhalation capsule Place 1 capsule (18 mcg total) into inhaler and inhale daily. (Patient not taking: Reported on 11/16/2019) 90 capsule 1   Facility-Administered Medications Prior to Visit  Medication Dose Route Frequency Provider Last Rate Last Admin  . regadenoson (LEXISCAN) injection SOLN 0.4 mg  0.4 mg Intravenous Once Ena Dawley H, MD      . technetium tetrofosmin (TC-MYOVIEW) injection 02.4 millicurie  09.7 millicurie Intravenous Once PRN Dorothy Spark, MD         ROS Review of Systems  Constitutional: Negative for activity change, appetite change and fatigue.  HENT: Negative for congestion, sinus pressure and sore throat.   Eyes: Negative for visual disturbance.  Respiratory: Negative for cough, chest tightness, shortness of breath and wheezing.   Cardiovascular: Negative for chest pain and palpitations.  Gastrointestinal: Negative for abdominal distention, abdominal pain and constipation.  Endocrine: Negative for polydipsia.    Genitourinary: Negative for dysuria and frequency.  Musculoskeletal:       See HPI  Skin: Positive for rash.  Neurological: Negative for tremors, light-headedness and numbness.  Hematological: Does not bruise/bleed easily.  Psychiatric/Behavioral: Negative for agitation and behavioral problems.    Objective:  BP 125/79   Pulse 88   Ht $R'5\' 7"'AV$  (1.702 m)   Wt 158 lb (71.7 kg)   LMP 07/24/2011   SpO2 97%   BMI 24.75 kg/m   BP/Weight 04/02/2020 11/16/2019 3/53/2992  Systolic BP 426 834 196  Diastolic BP 79 79 78  Wt. (Lbs) 158 163 166.5  BMI 24.75 25.53 26.08      Physical Exam Constitutional:      Appearance: She is well-developed.  Neck:     Vascular: No JVD.  Cardiovascular:     Rate and Rhythm: Normal rate.     Heart sounds: Normal heart sounds. No murmur heard.   Pulmonary:     Effort: Pulmonary effort is normal.     Breath sounds: Normal breath sounds. No wheezing or rales.  Chest:     Chest wall: No tenderness.  Abdominal:     General: Bowel sounds are normal. There is no distension.     Palpations: Abdomen is soft. There is no mass.     Tenderness: There is no abdominal tenderness.  Musculoskeletal:     Right lower leg: No edema.     Left lower leg: No edema.     Comments: Abduction of right upper extremity limited to 90 degrees With associated pain on active range of motion. Positive straight leg raise on the left lower extremity.  No back tenderness  Skin:    Findings: Rash (follicular rash in different stages on healing on face and extremities) present.  Neurological:     Mental Status: She is alert and oriented to person, place, and time.  Psychiatric:        Mood and Affect: Mood normal.     CMP Latest Ref Rng & Units 07/31/2019 12/11/2018 10/02/2018  Glucose 65 - 99 mg/dL 245(H) 290(H) 318(H)  BUN 6 - 24 mg/dL 8 5(L) 5(L)  Creatinine 0.57 - 1.00 mg/dL 0.55(L) 0.53 0.61  Sodium 134 - 144 mmol/L 137 137 141  Potassium 3.5 - 5.2 mmol/L 4.2 4.0 4.0  Chloride 96 - 106 mmol/L 100 103 103  CO2 20 - 29 mmol/L _0 Calcium 8.7 - 10.2 mg/dL 9.1 9.2 9.1  Total Protein 6.0 - 8.5 g/dL 6.8 - 6.7  Total Bilirubin 0.0 - 1.2 mg/dL 0.3 - <0.2  Alkaline Phos 39 - 117 IU/L 151(H) - 118(H)  AST 0 - 40 IU/L 46(H) - 17  ALT 0 - 32 IU/L 52(H) - 21    Lipid Panel     Component Value Date/Time   CHOL 151 07/31/2019 1004   TRIG 287 (H) 07/31/2019 1004   HDL 27 (L) 07/31/2019 1004   CHOLHDL 5.6 (H) 07/31/2019 1004   CHOLHDL 5.1 (H) 07/05/2016 0947   VLDL UNABLE TO CALCULATE IF TRIGLYCERIDE OVER 400 mg/dL 05/24/2016 0415   LDLCALC 77 07/31/2019 1004    CBC    Component Value Date/Time   WBC 9.5 12/11/2018 2209   RBC 5.32 (H) 12/11/2018 2209   HGB 14.6 12/11/2018 2209   HCT 45.8 12/11/2018 2209   PLT 182 12/11/2018 2209   MCV 86.1 12/11/2018 2209   MCH 27.4 12/11/2018 2209   MCHC 31.9 12/11/2018 2209   RDW 13.0 12/11/2018 2209   LYMPHSABS 2.6 04/01/2016 2144   MONOABS 0.4 04/01/2016 2144   EOSABS 0.3 04/01/2016 2144   BASOSABS 0.0 04/01/2016 2144    Lab Results  Component Value Date   HGBA1C 11.6 (A) 04/02/2020    Assessment & Plan:  1. Type 2 diabetes mellitus with hyperglycemia, with long-term current use of insulin (HCC) Uncontrolled with A1c of 11.6 Increased dose of NovoLog 70/30 Counseled on Diabetic diet, my plate method, 922 minutes of moderate intensity exercise/week Blood sugar logs with fasting goals of 80-120 mg/dl, random of less than 180 and in the event of sugars less than 60 mg/dl or greater than 400 mg/dl encouraged to notify the clinic. Advised on the need for annual eye exams, annual foot exams, Pneumonia vaccine. - POCT glucose (manual entry) - POCT glycosylated hemoglobin (Hb H0O) - Basic Metabolic Panel - insulin aspart protamine - aspart (NOVOLOG MIX 70/30 FLEXPEN) (70-30) 100 UNIT/ML FlexPen; Inject subcutaneously 40 units in the morning and 30 units in the evening  Dispense: 30 mL; Refill: 6 -  gabapentin (NEURONTIN) 300 MG capsule; Take 1 capsule (300 mg total) by mouth 2 (two) times daily.  Dispense: 60 capsule; Refill: 3 - atorvastatin (LIPITOR) 40 MG tablet; Take 1 tablet (40 mg total) by mouth daily.  Dispense: 90 tablet; Refill: 1  2. Vaginal candidiasis Likely due to hyperglycemia - fluconazole (DIFLUCAN) 150 MG tablet; Take 1 tablet (150 mg total) by mouth once for 1 dose.  Dispense: 1 tablet; Refill: 0  3. Adhesive capsulitis of right shoulder Discussed physical therapy but she will be unable to commit to this given she has a 46-monthold grandchild Advised to obtain Voltaren gel OTC Perform home exercises  4. Sciatica of left side See #3 above  5. Hypertension complicating diabetes ([place]rolled Counseled on blood pressure goal of less than 130/80, low-sodium, DASH diet, medication compliance, 150 minutes of moderate intensity exercise per week. Discussed medication compliance, adverse effects. - metoprolol succinate (TOPROL-XL) 25 MG 24 hr tablet; Take 1 tablet (25 mg total) by mouth daily.  Dispense: 90 tablet; Refill: 1  6. Anxiety Uncontrolled Hydroxyzine added to regimen - hydrOXYzine (ATARAX/VISTARIL) 10 MG tablet; Take 1 tablet (10 mg total) by mouth 3 (three) times daily as needed.  Dispense: 60 tablet; Refill: 3 - FLUoxetine (PROZAC) 40 MG  capsule; Take 1 capsule (40 mg total) by mouth at bedtime.  Dispense: 90 capsule; Refill: 1  7. History of gastroesophageal reflux (GERD) - esomeprazole (NEXIUM) 20 MG capsule; Take 1 capsule (20 mg total) by mouth daily.  Dispense: 90 capsule; Refill: 1  8. Irritable bowel syndrome with diarrhea Uncontrolled Referred to GI - diphenoxylate-atropine (LOMOTIL) 2.5-0.025 MG tablet; Take 1 tablet by mouth 4 (four) times daily as needed for diarrhea or loose stools.  Dispense: 30 tablet; Refill: 2 - Ambulatory referral to Gastroenterology - dicyclomine (BENTYL) 10 MG capsule; Take 1 capsule (10 mg total) by mouth 3  (three) times daily before meals.  Dispense: 270 capsule; Refill: 1  9. Folliculitis - Ambulatory referral to Dermatology  10. Diabetic polyneuropathy associated with type 2 diabetes mellitus (HCC) Restarted gabapentin  11. Hypertensive heart disease with other congestive heart failure (HCC) EF of 51% from nuclear stress test of 2019 CHF is stable and she is euvolemic    Meds ordered this encounter  Medications  . diphenoxylate-atropine (LOMOTIL) 2.5-0.025 MG tablet    Sig: Take 1 tablet by mouth 4 (four) times daily as needed for diarrhea or loose stools.    Dispense:  30 tablet    Refill:  2  . hydrOXYzine (ATARAX/VISTARIL) 10 MG tablet    Sig: Take 1 tablet (10 mg total) by mouth 3 (three) times daily as needed.    Dispense:  60 tablet    Refill:  3  . fluconazole (DIFLUCAN) 150 MG tablet    Sig: Take 1 tablet (150 mg total) by mouth once for 1 dose.    Dispense:  1 tablet    Refill:  0  . insulin aspart protamine - aspart (NOVOLOG MIX 70/30 FLEXPEN) (70-30) 100 UNIT/ML FlexPen    Sig: Inject subcutaneously 40 units in the morning and 30 units in the evening    Dispense:  30 mL    Refill:  6    Dose increase  . gabapentin (NEURONTIN) 300 MG capsule    Sig: Take 1 capsule (300 mg total) by mouth 2 (two) times daily.    Dispense:  60 capsule    Refill:  3  . metoprolol succinate (TOPROL-XL) 25 MG 24 hr tablet    Sig: Take 1 tablet (25 mg total) by mouth daily.    Dispense:  90 tablet    Refill:  1  . FLUoxetine (PROZAC) 40 MG capsule    Sig: Take 1 capsule (40 mg total) by mouth at bedtime.    Dispense:  90 capsule    Refill:  1  . esomeprazole (NEXIUM) 20 MG capsule    Sig: Take 1 capsule (20 mg total) by mouth daily.    Dispense:  90 capsule    Refill:  1  . dicyclomine (BENTYL) 10 MG capsule    Sig: Take 1 capsule (10 mg total) by mouth 3 (three) times daily before meals.    Dispense:  270 capsule    Refill:  1  . atorvastatin (LIPITOR) 40 MG tablet    Sig:  Take 1 tablet (40 mg total) by mouth daily.    Dispense:  90 tablet    Refill:  1    Discontinue fenofibrate.  Dose increased    Follow-up: Return in about 3 months (around 07/03/2020) for Chronic disease management.       Charlott Rakes, MD, FAAFP. [facility] [place], [place]   04/02/2020, 5:34 PM

## 2020-04-02 NOTE — Patient Instructions (Signed)
Adhesive Capsulitis  Adhesive capsulitis, also called frozen shoulder, causes the shoulder to become stiff and painful to move. This condition happens when there is inflammation of the tendons and ligaments that surround the shoulder joint (shoulder capsule). What are the causes? This condition may be caused by:  An injury to your shoulder joint.  Straining your shoulder.  Not moving your shoulder for a period of time. This can happen if your arm was injured or in a sling.  Long-standing conditions, such as: ? Diabetes. ? Thyroid problems. ? Heart disease. ? Stroke. ? Rheumatoid arthritis. ? Lung disease. In some cases, the cause is not known. What increases the risk? You are more likely to develop this condition if you are:  A woman.  Older than 58 years of age. What are the signs or symptoms? Symptoms of this condition include:  Pain in your shoulder when you move your arm. There may also be pain when parts of your shoulder are touched. The pain may be worse at night or when you are resting.  A sore or aching shoulder.  The inability to move your shoulder normally.  Muscle spasms. How is this diagnosed? This condition is diagnosed with a physical exam and imaging tests, such as an X-ray or MRI. How is this treated? This condition may be treated with:  Treatment of the underlying cause or condition.  Medicine. Medicine may be given to relieve pain, inflammation, or muscle spasms.  Steroid injections into the shoulder joint.  Physical therapy. This involves performing exercises to get the shoulder moving again.  Acupuncture. This is a type of treatment that involves stimulating specific points on your body by inserting thin needles through your skin.  Shoulder manipulation. This is a procedure to move the shoulder into another position. It is done after you are given a medicine to make you fall asleep (general anesthetic). The joint may also be injected with salt  water at high pressure to break down scarring.  Surgery. This may be done in severe cases when other treatments have failed. Although most people recover completely from adhesive capsulitis, some may not regain full shoulder movement. Follow these instructions at home: Managing pain, stiffness, and swelling      If directed, put ice on the injured area: ? Put ice in a plastic bag. ? Place a towel between your skin and the bag. ? Leave the ice on for 20 minutes, 2-3 times per day.  If directed, apply heat to the affected area before you exercise. Use the heat source that your health care provider recommends, such as a moist heat pack or a heating pad. ? Place a towel between your skin and the heat source. ? Leave the heat on for 20-30 minutes. ? Remove the heat if your skin turns bright red. This is especially important if you are unable to feel pain, heat, or cold. You may have a greater risk of getting burned. General instructions  Take over-the-counter and prescription medicines only as told by your health care provider.  If you are being treated with physical therapy, follow instructions from your physical therapist.  Avoid exercises that put a lot of demand on your shoulder, such as throwing. These exercises can make pain worse.  Keep all follow-up visits as told by your health care provider. This is important. Contact a health care provider if:  You develop new symptoms.  Your symptoms get worse. Summary  Adhesive capsulitis, also called frozen shoulder, causes the shoulder to become   stiff and painful to move.  You are more likely to have this condition if you are a woman and over age 40.  It is treated with physical therapy, medicines, and sometimes surgery. This information is not intended to replace advice given to you by your health care provider. Make sure you discuss any questions you have with your health care provider. Document Revised: 11/18/2017 Document  Reviewed: 11/18/2017 Elsevier Patient Education  2020 [employer]  

## 2020-04-02 NOTE — Progress Notes (Signed)
Has a knot in groin area states that it has gone down and its very tender.  She is having burning urination.

## 2020-04-03 ENCOUNTER — Encounter: Payer: Self-pay | Admitting: *Deleted

## 2020-04-03 LAB — BASIC METABOLIC PANEL
BUN/Creatinine Ratio: 15 (ref 9–23)
BUN: 8 mg/dL (ref 6–24)
CO2: 22 mmol/L (ref 20–29)
Calcium: 9.5 mg/dL (ref 8.7–10.2)
Chloride: 102 mmol/L (ref 96–106)
Creatinine, Ser: 0.52 mg/dL — ABNORMAL LOW (ref 0.57–1.00)
GFR calc Af Amer: 122 mL/min/{1.73_m2} (ref >59)
GFR calc non Af Amer: 106 mL/min/{1.73_m2} (ref >59)
Glucose: 121 mg/dL — ABNORMAL HIGH (ref 65–99)
Potassium: 3.9 mmol/L (ref 3.5–5.2)
Sodium: 139 mmol/L (ref 134–144)

## 2020-05-08 ENCOUNTER — Other Ambulatory Visit: Payer: Self-pay | Admitting: Family Medicine

## 2020-05-08 DIAGNOSIS — H8113 Benign paroxysmal vertigo, bilateral: Secondary | ICD-10-CM

## 2020-05-08 NOTE — Telephone Encounter (Signed)
Requested medication (s) are due for refill today: no  Requested medication (s) are on the active medication list: yes   Last refill: 05/08/2020  Future visit scheduled:yes   Notes to clinic:  this refill cannot be delegated  Requesting for future refills   Requested Prescriptions  Pending Prescriptions Disp Refills   meclizine (ANTIVERT) 25 MG tablet [Pharmacy Med Name: meclizine 25 mg tablet] 180 tablet 1    Sig: Take 1 tablet (25 mg total) by mouth 3 (three) times daily as needed for dizziness.      Not Delegated - Gastroenterology: Antiemetics Failed - 05/08/2020  2:46 PM      Failed - This refill cannot be delegated      Passed - Valid encounter within last 6 months    Recent Outpatient Visits           1 month ago Type 2 diabetes mellitus with hyperglycemia, with long-term current use of insulin (HCC)   [facility] [place], Van, MD   5 months ago Type 2 diabetes mellitus with hyperglycemia, with long-term current use of insulin (HCC)   [facility] [place], [place], MD   9 months ago Type 2 diabetes mellitus with hyperglycemia, with long-term current use of insulin (HCC)   [facility] Hoy Register, MD   1 year ago Irritable bowel syndrome with diarrhea   [facility] [place], Odette Horns, MD   1 year ago Type 2 diabetes mellitus with hyperglycemia, with long-term current use of insulin (HCC)   [facility] Hoy Register, MD       Future Appointments             In 2 months Hoy Register, MD [name]

## 2020-05-09 ENCOUNTER — Encounter: Payer: Self-pay | Admitting: Nurse Practitioner

## 2020-05-16 ENCOUNTER — Other Ambulatory Visit: Payer: Self-pay | Admitting: Family Medicine

## 2020-05-16 DIAGNOSIS — K580 Irritable bowel syndrome with diarrhea: Secondary | ICD-10-CM

## 2020-05-21 ENCOUNTER — Other Ambulatory Visit: Payer: Self-pay | Admitting: Family Medicine

## 2020-05-21 DIAGNOSIS — K580 Irritable bowel syndrome with diarrhea: Secondary | ICD-10-CM

## 2020-05-21 DIAGNOSIS — E1165 Type 2 diabetes mellitus with hyperglycemia: Secondary | ICD-10-CM

## 2020-05-21 DIAGNOSIS — Z794 Long term (current) use of insulin: Secondary | ICD-10-CM

## 2020-05-21 NOTE — Telephone Encounter (Signed)
Requested medication (s) are due for refill today:   Provider to decide  Requested medication (s) are on the active medication list:   Yes  Future visit scheduled:   Yes   Last ordered: 04/02/2020 #30, R 2  Non delegated    Requested Prescriptions  Pending Prescriptions Disp Refills   diphenoxylate-atropine (LOMOTIL) 2.5-0.025 MG tablet [Pharmacy Med Name: diphenoxylate-atropine 2.5 mg-0.025 mg tablet] 30 tablet 2    Sig: Take 1 tablet by mouth 4 (four) times daily as needed for diarrhea or loose stools.      Not Delegated - Gastroenterology:  Antidiarrheals Failed - 05/21/2020  2:18 PM      Failed - This refill cannot be delegated      Passed - Valid encounter within last 12 months    Recent Outpatient Visits           1 month ago Type 2 diabetes mellitus with hyperglycemia, with long-term current use of insulin (HCC)   [facility] [place], Odette Horns, MD   6 months ago Type 2 diabetes mellitus with hyperglycemia, with long-term current use of insulin (HCC)   [facility] [place], Odette Horns, MD   9 months ago Type 2 diabetes mellitus with hyperglycemia, with long-term current use of insulin (HCC)   [facility] [place], Odette Horns, MD   1 year ago Irritable bowel syndrome with diarrhea   [facility] [place], Odette Horns, MD   1 year ago Type 2 diabetes mellitus with hyperglycemia, with long-term current use of insulin (HCC)   [facility] [place], Odette Horns, MD       Future Appointments             In 1 month Hoy Register, MD [facility]              glimepiride (AMARYL) 4 MG tablet [Pharmacy Med Name: glimepiride 4 mg tablet] 180 tablet 1    Sig: Take 2 tablets (8 mg total) by mouth daily with breakfast.      Endocrinology:  Diabetes - Sulfonylureas Failed - 05/21/2020  2:18 PM      Failed - HBA1C is  between 0 and 7.9 and within 180 days    HbA1c, POC (controlled diabetic range)  Date Value Ref Range Status  04/02/2020 11.6 (A) 0.0 - 7.0 % Final          Passed - Valid encounter within last 6 months    Recent Outpatient Visits           1 month ago Type 2 diabetes mellitus with hyperglycemia, with long-term current use of insulin (HCC)   [facility] [place], [place], MD   6 months ago Type 2 diabetes mellitus with hyperglycemia, with long-term current use of insulin (HCC)   [facility] [place], [place], MD   9 months ago Type 2 diabetes mellitus with hyperglycemia, with long-term current use of insulin (HCC)   [facility] Hoy Register, MD   1 year ago Irritable bowel syndrome with diarrhea   [facility] [place], Odette Horns, MD   1 year ago Type 2 diabetes mellitus with hyperglycemia, with long-term current use of insulin (HCC)   [place] [facility] Hoy Register, MD       Future Appointments  In 1 month Hoy Register, MD [facility]

## 2020-06-03 ENCOUNTER — Ambulatory Visit: Payer: Medicaid Other | Admitting: Nurse Practitioner

## 2020-06-14 ENCOUNTER — Other Ambulatory Visit: Payer: Self-pay | Admitting: Family Medicine

## 2020-06-14 DIAGNOSIS — K580 Irritable bowel syndrome with diarrhea: Secondary | ICD-10-CM

## 2020-06-19 ENCOUNTER — Ambulatory Visit: Payer: Medicaid Other | Admitting: Physician Assistant

## 2020-06-30 ENCOUNTER — Other Ambulatory Visit: Payer: Self-pay | Admitting: Family Medicine

## 2020-06-30 DIAGNOSIS — E1165 Type 2 diabetes mellitus with hyperglycemia: Secondary | ICD-10-CM

## 2020-06-30 DIAGNOSIS — Z794 Long term (current) use of insulin: Secondary | ICD-10-CM

## 2020-06-30 NOTE — Telephone Encounter (Signed)
Requested Prescriptions  Pending Prescriptions Disp Refills  . gabapentin (NEURONTIN) 300 MG capsule [Pharmacy Med Name: gabapentin 300 mg capsule] 180 capsule 3    Sig: Take 1 capsule (300 mg total) by mouth 2 (two) times daily.     Neurology: Anticonvulsants - gabapentin Passed - 06/30/2020 10:59 AM      Passed - Valid encounter within last 12 months    Recent Outpatient Visits          2 months ago Type 2 diabetes mellitus with hyperglycemia, with long-term current use of insulin (HCC)   [facility] [place], [place], MD   7 months ago Type 2 diabetes mellitus with hyperglycemia, with long-term current use of insulin (HCC)   [facility] [place], [place], MD   11 months ago Type 2 diabetes mellitus with hyperglycemia, with long-term current use of insulin (HCC)   [facility] Hoy Register, MD   1 year ago Irritable bowel syndrome with diarrhea   [facility] [facility] [place], Odette Horns, MD   2 years ago Type 2 diabetes mellitus with hyperglycemia, with long-term current use of insulin (HCC)   [place] [facility] Hoy Register, MD      Future Appointments            In 1 week Hoy Register, MD [facility]

## 2020-07-04 ENCOUNTER — Other Ambulatory Visit: Payer: Self-pay | Admitting: Family Medicine

## 2020-07-04 DIAGNOSIS — F419 Anxiety disorder, unspecified: Secondary | ICD-10-CM

## 2020-07-07 ENCOUNTER — Other Ambulatory Visit: Payer: Self-pay | Admitting: Family Medicine

## 2020-07-07 DIAGNOSIS — M5430 Sciatica, unspecified side: Secondary | ICD-10-CM

## 2020-07-07 DIAGNOSIS — M549 Dorsalgia, unspecified: Secondary | ICD-10-CM

## 2020-07-09 ENCOUNTER — Other Ambulatory Visit: Payer: Medicaid Other

## 2020-07-10 ENCOUNTER — Ambulatory Visit: Payer: Medicaid Other | Admitting: Physician Assistant

## 2020-07-10 ENCOUNTER — Ambulatory Visit: Payer: Medicaid Other | Attending: Family Medicine | Admitting: Family Medicine

## 2020-07-10 ENCOUNTER — Other Ambulatory Visit: Payer: Self-pay | Admitting: Family Medicine

## 2020-07-10 ENCOUNTER — Other Ambulatory Visit: Payer: Self-pay

## 2020-07-10 DIAGNOSIS — E1159 Type 2 diabetes mellitus with other circulatory complications: Secondary | ICD-10-CM

## 2020-07-11 ENCOUNTER — Ambulatory Visit: Payer: Medicaid Other | Attending: Family Medicine

## 2020-07-11 ENCOUNTER — Other Ambulatory Visit: Payer: Self-pay

## 2020-07-11 DIAGNOSIS — E1165 Type 2 diabetes mellitus with hyperglycemia: Secondary | ICD-10-CM

## 2020-07-11 DIAGNOSIS — E1159 Type 2 diabetes mellitus with other circulatory complications: Secondary | ICD-10-CM

## 2020-07-11 DIAGNOSIS — Z794 Long term (current) use of insulin: Secondary | ICD-10-CM

## 2020-07-11 DIAGNOSIS — I152 Hypertension secondary to endocrine disorders: Secondary | ICD-10-CM

## 2020-07-12 LAB — CMP14+EGFR
ALT: 23 IU/L (ref 0–32)
AST: 19 IU/L (ref 0–40)
Albumin/Globulin Ratio: 1.3 (ref 1.2–2.2)
Albumin: 4.1 g/dL (ref 3.8–4.9)
Alkaline Phosphatase: 122 IU/L — ABNORMAL HIGH (ref 44–121)
BUN/Creatinine Ratio: 14 (ref 9–23)
BUN: 9 mg/dL (ref 6–24)
Bilirubin Total: 0.4 mg/dL (ref 0.0–1.2)
CO2: 22 mmol/L (ref 20–29)
Calcium: 9.4 mg/dL (ref 8.7–10.2)
Chloride: 98 mmol/L (ref 96–106)
Creatinine, Ser: 0.63 mg/dL (ref 0.57–1.00)
GFR calc Af Amer: 114 mL/min/{1.73_m2} (ref >59)
GFR calc non Af Amer: 99 mL/min/{1.73_m2} (ref >59)
Globulin, Total: 3.2 g/dL (ref 1.5–4.5)
Glucose: 337 mg/dL — ABNORMAL HIGH (ref 65–99)
Potassium: 4.4 mmol/L (ref 3.5–5.2)
Sodium: 136 mmol/L (ref 134–144)
Total Protein: 7.3 g/dL (ref 6.0–8.5)

## 2020-07-15 ENCOUNTER — Other Ambulatory Visit: Payer: Self-pay | Admitting: Family Medicine

## 2020-07-15 DIAGNOSIS — E1165 Type 2 diabetes mellitus with hyperglycemia: Secondary | ICD-10-CM

## 2020-07-15 LAB — POCT GLYCOSYLATED HEMOGLOBIN (HGB A1C): Hemoglobin A1C: 13.4 % — AB (ref 4.0–5.6)

## 2020-07-15 MED ORDER — NOVOLOG MIX 70/30 FLEXPEN (70-30) 100 UNIT/ML ~~LOC~~ SUPN: PEN_INJECTOR | SUBCUTANEOUS | 1 refills | Status: DC

## 2020-07-15 NOTE — Progress Notes (Signed)
Patient tolerated stick well.

## 2020-07-21 ENCOUNTER — Ambulatory Visit: Payer: Self-pay | Admitting: *Deleted

## 2020-07-21 ENCOUNTER — Other Ambulatory Visit: Payer: Medicaid Other

## 2020-07-21 DIAGNOSIS — Z20822 Contact with and (suspected) exposure to covid-19: Secondary | ICD-10-CM

## 2020-07-21 NOTE — Telephone Encounter (Signed)
Summary: possible covid symptoms    Pt called and is experiencing Nausea with her mouth watering, she also mentioned she has a fever and chills /Pt has a covid test appt at 11:45/ please advise      Called patient , no answer, LVMTCB.

## 2020-07-21 NOTE — Telephone Encounter (Signed)
FYI

## 2020-07-21 NOTE — Telephone Encounter (Signed)
Third attempt to reach pt. LM on VM to call back. Will forward to office.  Per [facility] agent: Summary: possible covid symptoms    Pt called and is experiencing Nausea with her mouth watering, she also mentioned she has a fever and chills /Pt has a covid test appt at 11:45/ please advise

## 2020-07-21 NOTE — Telephone Encounter (Signed)
2 nd attempt to contact patient. No answer, left VMTCB.

## 2020-07-22 LAB — NOVEL CORONAVIRUS, NAA: SARS-CoV-2, NAA: DETECTED — AB

## 2020-07-22 LAB — SARS-COV-2, NAA 2 DAY TAT

## 2020-07-23 ENCOUNTER — Telehealth: Payer: Self-pay | Admitting: Unknown Physician Specialty

## 2020-07-23 ENCOUNTER — Other Ambulatory Visit: Payer: Self-pay | Admitting: Family

## 2020-07-23 DIAGNOSIS — U071 COVID-19: Secondary | ICD-10-CM

## 2020-07-23 NOTE — Telephone Encounter (Signed)
Sx onset 07/20/20. Positive test 07/21/20. Unvaccinated.   I connected by phone with [name] on 07/23/2020 at 4:09 PM to discuss the potential use of a new treatment for mild to moderate COVID-19 viral infection in non-hospitalized patients.  This patient is a 60 y.o. female that meets the FDA criteria for Emergency Use Authorization of COVID monoclonal antibody sotrovimab.  Has a (+) direct SARS-CoV-2 viral test result  Has mild or moderate COVID-19   Is NOT hospitalized due to COVID-19  Is within 10 days of symptom onset  Has at least one of the high risk factor(s) for progression to severe COVID-19 and/or hospitalization as defined in EUA.  Specific high risk criteria : BMI > 25, Diabetes and Cardiovascular disease or hypertension   I have spoken and communicated the following to the patient or parent/caregiver regarding COVID monoclonal antibody treatment:  1. FDA has authorized the emergency use for the treatment of mild to moderate COVID-19 in adults and pediatric patients with positive results of direct SARS-CoV-2 viral testing who are 57 years of age and older weighing at least 40 kg, and who are at high risk for progressing to severe COVID-19 and/or hospitalization.  2. The significant known and potential risks and benefits of COVID monoclonal antibody, and the extent to which such potential risks and benefits are unknown.  3. Information on available alternative treatments and the risks and benefits of those alternatives, including clinical trials.  4. Patients treated with COVID monoclonal antibody should continue to self-isolate and use infection control measures (e.g., wear mask, isolate, social distance, avoid sharing personal items, clean and disinfect "high touch" surfaces, and frequent handwashing) according to CDC guidelines.   5. The patient or parent/caregiver has the option to accept or refuse COVID monoclonal antibody treatment.  After reviewing this  information with the patient, the patient has agreed to receive one of the available covid 19 monoclonal antibodies and will be provided an appropriate fact sheet prior to infusion. Alver Sorrow, NP 07/23/2020 4:09 PM

## 2020-07-23 NOTE — Telephone Encounter (Signed)
Called to discuss with patient about COVID-19 symptoms and the use of one of the available treatments for those with mild to moderate Covid symptoms and at a high risk of hospitalization.  Pt appears to qualify for outpatient treatment due to co-morbid conditions and/or a member of an at-risk group in accordance with the FDA Emergency Use Authorization.    Symptom onset: ? Vaccinated: ? Booster?  Immunocompromised? No Qualifiers: Htn, CHF  Unable to reach pt - LMOM  Gabriel Cirri

## 2020-07-24 ENCOUNTER — Ambulatory Visit (HOSPITAL_COMMUNITY)
Admission: RE | Admit: 2020-07-24 | Discharge: 2020-07-24 | Disposition: A | Discharge status: Home / Self Care | Payer: Medicaid Other | Source: Ambulatory Visit | Attending: Pulmonary Disease | Admitting: Pulmonary Disease

## 2020-07-24 DIAGNOSIS — Z6825 Body mass index (BMI) 25.0-25.9, adult: Secondary | ICD-10-CM | POA: Insufficient documentation

## 2020-07-24 DIAGNOSIS — E119 Type 2 diabetes mellitus without complications: Secondary | ICD-10-CM | POA: Diagnosis not present

## 2020-07-24 DIAGNOSIS — U071 COVID-19: Secondary | ICD-10-CM | POA: Diagnosis present

## 2020-07-24 DIAGNOSIS — I10 Essential (primary) hypertension: Secondary | ICD-10-CM | POA: Diagnosis not present

## 2020-07-24 MED ORDER — FAMOTIDINE IN NACL 20-0.9 MG/50ML-% IV SOLN: 20.0000 mg | Freq: Once | INTRAVENOUS | Status: DC | PRN

## 2020-07-24 MED ORDER — SOTROVIMAB 500 MG/8ML IV SOLN
500.0000 mg | Freq: Once | INTRAVENOUS | Status: AC
  Administered 2020-07-24: 500 mg via INTRAVENOUS

## 2020-07-24 MED ORDER — SODIUM CHLORIDE 0.9 % IV SOLN: INTRAVENOUS | Status: DC | PRN

## 2020-07-24 MED ORDER — ALBUTEROL SULFATE HFA 108 (90 BASE) MCG/ACT IN AERS
2.0000 | INHALATION_SPRAY | Freq: Once | RESPIRATORY_TRACT | Status: DC | PRN
Start: 2020-07-24 — End: 2020-07-25

## 2020-07-24 MED ORDER — METHYLPREDNISOLONE SODIUM SUCC 125 MG IJ SOLR: 125.0000 mg | Freq: Once | INTRAMUSCULAR | Status: DC | PRN

## 2020-07-24 MED ORDER — DIPHENHYDRAMINE HCL 50 MG/ML IJ SOLN
50.0000 mg | Freq: Once | INTRAMUSCULAR | Status: DC | PRN
Start: 2020-07-24 — End: 2020-07-25

## 2020-07-24 MED ORDER — EPINEPHRINE 0.3 MG/0.3ML IJ SOAJ: 0.3000 mg | Freq: Once | INTRAMUSCULAR | Status: DC | PRN

## 2020-07-24 NOTE — Discharge Instructions (Signed)
10 Things You Can Do to Manage Your COVID-19 Symptoms at Home °If you have possible or confirmed COVID-19: °1. Stay home except to get medical care. °2. Monitor your symptoms carefully. If your symptoms get worse, call your healthcare provider immediately. °3. Get rest and stay hydrated. °4. If you have a medical appointment, call the healthcare provider ahead of time and tell them that you have or may have COVID-19. °5. For medical emergencies, call 911 and notify the dispatch personnel that you have or may have COVID-19. °6. Cover your cough and sneezes with a tissue or use the inside of your elbow. °7. Wash your hands often with soap and water for at least 20 seconds or clean your hands with an alcohol-based hand sanitizer that contains at least 60% alcohol. °8. As much as possible, stay in a specific room and away from other people in your home. Also, you should use a separate bathroom, if available. If you need to be around other people in or outside of the home, wear a mask. °9. Avoid sharing personal items with other people in your household, like dishes, towels, and bedding. °10. Clean all surfaces that are touched often, like counters, tabletops, and doorknobs. Use household cleaning sprays or wipes according to the label instructions. °cdc.gov/coronavirus °01/11/2020 °This information is not intended to replace advice given to you by your health care provider. Make sure you discuss any questions you have with your health care provider. °Document Revised: 04/28/2020 Document Reviewed: 04/28/2020 °Elsevier Patient Education © 2021 Elsevier Inc[employer] °What types of side effects do monoclonal antibody drugs cause?  °Common side effects ° °In general, the more common side effects caused by monoclonal antibody drugs include: °• Allergic reactions, such as hives or itching °• Flu-like signs and symptoms, including chills, fatigue, fever, and muscle aches and pains °• Nausea, vomiting °• Diarrhea °• Skin rashes °• Low  blood pressure ° ° °The CDC is recommending patients who receive monoclonal antibody treatments wait at least 90 days before being vaccinated. ° °Currently, there are no data on the safety and efficacy of mRNA COVID-19 vaccines in persons who received monoclonal antibodies or convalescent plasma as part of COVID-19 treatment. Based on the estimated half-life of such therapies as well as evidence suggesting that reinfection is uncommon in the 90 days after initial infection, vaccination should be deferred for at least 90 days, as a precautionary measure until additional information becomes available, to avoid interference of the antibody treatment with vaccine-induced immune responses. °Patient reviewed Fact Sheet for Patients, Parents, and Caregivers for Emergency Use Authorization (EUA) of sotrovimab for the Treatment of Coronavirus. Patient also reviewed and is agreeable to the estimated cost of treatment. Patient is agreeable to proceed.   ° °

## 2020-07-24 NOTE — Progress Notes (Signed)
Diagnosis: COVID-19  Physician: Dr. Patrick Wright  Procedure: Covid Infusion Clinic Med: Sotrovimab infusion - Provided patient with sotrovimab fact sheet for patients, parents, and caregivers prior to infusion.   Complications: No immediate complications noted  Discharge: Discharged home    

## 2020-07-24 NOTE — Progress Notes (Signed)
Patient reviewed Fact Sheet for Patients, Parents, and Caregivers for Emergency Use Authorization (EUA) of sotrovimab for the Treatment of Coronavirus. Patient also reviewed and is agreeable to the estimated cost of treatment. Patient is agreeable to proceed.   

## 2020-07-28 ENCOUNTER — Other Ambulatory Visit: Payer: Self-pay | Admitting: Family Medicine

## 2020-07-28 DIAGNOSIS — K580 Irritable bowel syndrome with diarrhea: Secondary | ICD-10-CM

## 2020-07-28 NOTE — Telephone Encounter (Signed)
Requested medication (s) are due for refill today: yes  Requested medication (s) are on the active medication list: no  Last refill:  05/26/20 #30  Future visit scheduled: yes  Notes to clinic:  Please review for refill. Prescription for pen tips not on current med list. Refill of diphenoxylate-atropine not delegated per protocol    Requested Prescriptions  Pending Prescriptions Disp Refills   UNIFINE PENTIPS PLUS 32G X 4 MM MISC [Pharmacy Med Name: Unifine Pentips Plus 32 gauge x 5/32" needle] 100 each 2    Sig: USE TO administer lantus AND humlog TWICE DAILY      Endocrinology: Diabetes - Testing Supplies Passed - 07/28/2020  9:45 AM      Passed - Valid encounter within last 12 months    Recent Outpatient Visits           3 months ago Type 2 diabetes mellitus with hyperglycemia, with long-term current use of insulin (HCC)   [facility] [place], [place], MD   8 months ago Type 2 diabetes mellitus with hyperglycemia, with long-term current use of insulin (HCC)   [facility] [place], [place], MD   12 months ago Type 2 diabetes mellitus with hyperglycemia, with long-term current use of insulin (HCC)   [facility] [place], Odette Horns, MD   1 year ago Irritable bowel syndrome with diarrhea   [facility] [place], [place], MD   2 years ago Type 2 diabetes mellitus with hyperglycemia, with long-term current use of insulin (HCC)   [facility] [place], Odette Horns, MD       Future Appointments             In 1 week Hoy Register, MD [facility]               diphenoxylate-atropine (LOMOTIL) 2.5-0.025 MG tablet [Pharmacy Med Name: diphenoxylate-atropine 2.5 mg-0.025 mg tablet] 30 tablet 2    Sig: Take 1 tablet by mouth 4 (four) times daily as needed for diarrhea or loose stools.      Not Delegated -  Gastroenterology:  Antidiarrheals Failed - 07/28/2020  9:45 AM      Failed - This refill cannot be delegated      Passed - Valid encounter within last 12 months    Recent Outpatient Visits           3 months ago Type 2 diabetes mellitus with hyperglycemia, with long-term current use of insulin (HCC)   [place] [facility] [facility] [place], [place], MD   8 months ago Type 2 diabetes mellitus with hyperglycemia, with long-term current use of insulin (HCC)   [facility] [place], [place], MD   12 months ago Type 2 diabetes mellitus with hyperglycemia, with long-term current use of insulin (HCC)   [facility] Hoy Register, MD   1 year ago Irritable bowel syndrome with diarrhea   [facility] [place], Odette Horns, MD   2 years ago Type 2 diabetes mellitus with hyperglycemia, with long-term current use of insulin (HCC)   [facility] Hoy Register, MD       Future Appointments             In 1 week Hoy Register, MD [facility]

## 2020-07-31 ENCOUNTER — Other Ambulatory Visit: Payer: Self-pay | Admitting: Family Medicine

## 2020-07-31 ENCOUNTER — Ambulatory Visit: Payer: Medicaid Other | Admitting: Physician Assistant

## 2020-07-31 DIAGNOSIS — E1165 Type 2 diabetes mellitus with hyperglycemia: Secondary | ICD-10-CM

## 2020-07-31 DIAGNOSIS — M5430 Sciatica, unspecified side: Secondary | ICD-10-CM

## 2020-07-31 DIAGNOSIS — M549 Dorsalgia, unspecified: Secondary | ICD-10-CM

## 2020-08-05 ENCOUNTER — Ambulatory Visit: Payer: Medicaid Other | Admitting: Family Medicine

## 2020-08-06 ENCOUNTER — Other Ambulatory Visit: Payer: Self-pay | Admitting: Family Medicine

## 2020-08-06 DIAGNOSIS — E1165 Type 2 diabetes mellitus with hyperglycemia: Secondary | ICD-10-CM

## 2020-08-06 DIAGNOSIS — M549 Dorsalgia, unspecified: Secondary | ICD-10-CM

## 2020-08-06 DIAGNOSIS — Z794 Long term (current) use of insulin: Secondary | ICD-10-CM

## 2020-08-09 ENCOUNTER — Other Ambulatory Visit: Payer: Self-pay | Admitting: Family Medicine

## 2020-08-09 DIAGNOSIS — Z8719 Personal history of other diseases of the digestive system: Secondary | ICD-10-CM

## 2020-08-09 DIAGNOSIS — Z794 Long term (current) use of insulin: Secondary | ICD-10-CM

## 2020-08-09 DIAGNOSIS — E1159 Type 2 diabetes mellitus with other circulatory complications: Secondary | ICD-10-CM

## 2020-08-09 DIAGNOSIS — M5430 Sciatica, unspecified side: Secondary | ICD-10-CM

## 2020-08-09 DIAGNOSIS — M549 Dorsalgia, unspecified: Secondary | ICD-10-CM

## 2020-08-09 DIAGNOSIS — E1165 Type 2 diabetes mellitus with hyperglycemia: Secondary | ICD-10-CM

## 2020-08-14 ENCOUNTER — Ambulatory Visit: Payer: Medicaid Other | Admitting: Physician Assistant

## 2020-08-14 ENCOUNTER — Other Ambulatory Visit: Payer: Self-pay | Admitting: Family Medicine

## 2020-08-14 DIAGNOSIS — F419 Anxiety disorder, unspecified: Secondary | ICD-10-CM

## 2020-08-26 ENCOUNTER — Other Ambulatory Visit: Payer: Self-pay | Admitting: Family Medicine

## 2020-08-26 DIAGNOSIS — E119 Type 2 diabetes mellitus without complications: Secondary | ICD-10-CM

## 2020-09-03 ENCOUNTER — Other Ambulatory Visit: Payer: Self-pay | Admitting: Family Medicine

## 2020-09-03 DIAGNOSIS — H8113 Benign paroxysmal vertigo, bilateral: Secondary | ICD-10-CM

## 2020-09-03 NOTE — Telephone Encounter (Signed)
Requested medication (s) are due for refill today: no  Requested medication (s) are on the active medication list: yes  Last refill:  09/03/2020  Future visit scheduled: yes  Notes to clinic: this refill cannot be delegated    Requested Prescriptions  Pending Prescriptions Disp Refills   meclizine (ANTIVERT) 25 MG tablet [Pharmacy Med Name: meclizine 25 mg tablet] 180 tablet 1    Sig: Take 1 tablet (25 mg total) by mouth 3 (three) times daily as needed for dizziness.      Not Delegated - Gastroenterology: Antiemetics Failed - 09/03/2020 11:06 AM      Failed - This refill cannot be delegated      Passed - Valid encounter within last 6 months    Recent Outpatient Visits           5 months ago Type 2 diabetes mellitus with hyperglycemia, with long-term current use of insulin (HCC)   [facility] [place], [place], MD   9 months ago Type 2 diabetes mellitus with hyperglycemia, with long-term current use of insulin (HCC)   [facility] [place], [place], MD   1 year ago Type 2 diabetes mellitus with hyperglycemia, with long-term current use of insulin (HCC)   [facility] Hoy Register, MD   1 year ago Irritable bowel syndrome with diarrhea   [place] [facility] [place], Odette Horns, MD   2 years ago Type 2 diabetes mellitus with hyperglycemia, with long-term current use of insulin [place])   [place] [facility] Hoy Register, MD

## 2020-09-08 ENCOUNTER — Other Ambulatory Visit: Payer: Self-pay | Admitting: Family Medicine

## 2020-09-08 DIAGNOSIS — E1165 Type 2 diabetes mellitus with hyperglycemia: Secondary | ICD-10-CM

## 2020-09-12 ENCOUNTER — Ambulatory Visit: Payer: Medicaid Other | Admitting: Internal Medicine

## 2020-09-12 NOTE — Progress Notes (Deleted)
Name: [name]  MRN/ DOB: [id number], April 08, 1961   Age/ Sex: 60 y.o., female    PCP: Charlott Rakes, MD   Reason for Endocrinology Evaluation: Type 2 Diabetes Mellitus     Date of Initial Endocrinology Visit: 09/12/2020     PATIENT IDENTIFIER: [name] is a 60 y.o. female with a past medical history of HTN, CHF, COPD and T2DM. The patient presented for initial endocrinology clinic visit on 09/12/2020 for consultative assistance with her diabetes management.    HPI: Ms. Arnall was    Diagnosed with DM in 2017 Prior Medications tried/Intolerance: *** Currently checking blood sugars *** x / day,  before breakfast and ***.  Hypoglycemia episodes : ***               Symptoms: ***              Frequency: ***/  Hemoglobin A1c has ranged from 9.0% in 2018, peaking at 13.4% in 2022. Patient required assistance for hypoglycemia:  Patient has required hospitalization within the last 1 year from hyper or hypoglycemia:   In terms of diet, the patient ***   HOME DIABETES REGIMEN: Glimepiride 4 mg  Novolog MIx   Statin: {Yes/No:11203} ACE-I/ARB: {YES/NO:17245} Prior Diabetic Education: {Yes/No:11203}   METER DOWNLOAD SUMMARY: Date range evaluated: *** Fingerstick Blood Glucose Tests = *** Average Number Tests/Day = *** Overall Mean FS Glucose = *** Standard Deviation = ***  BG Ranges: Low = *** High = ***   Hypoglycemic Events/30 Days: BG < 50 = *** Episodes of symptomatic severe hypoglycemia = ***   DIABETIC COMPLICATIONS: Microvascular complications:   ***  Denies: CKD  Last eye exam: Completed   Macrovascular complications:   CHF  Denies: CAD, PVD, CVA   PAST HISTORY: Past Medical History:  Past Medical History:  Diagnosis Date  . Anxiety   . Arthritis   . Chronic systolic heart failure (HCC)    EF 20-25% ECHO 05/2015  . COPD (chronic obstructive pulmonary disease) ([place])   . Depression   . Diabetes mellitus without  complication ([place])    Type II  . Dysrhythmia    pt unsure of name of arrythmia - " my heart rate will drop all of a sudden" -no current treatment - atrial flutter  . Fibrillation, atrial ([place])   . Gallstones   . GERD (gastroesophageal reflux disease)   . Headache(784.0)    migraines  . Hypertension   . Osteomyelitis (HCC)    R ankle  . Rash    arms    Past Surgical History:  Past Surgical History:  Procedure Laterality Date  . ANKLE FRACTURE SURGERY Right 2015  . CARDIAC CATHETERIZATION N/A 06/24/2015   Procedure: Left Heart Cath and Coronary Angiography;  Surgeon: Belva Crome, MD;  Location: [facility];  Service: Cardiovascular;  Laterality: N/A;  . CARPAL TUNNEL RELEASE     bil  . CHOLECYSTECTOMY  11/24/2011   Procedure: LAPAROSCOPIC CHOLECYSTECTOMY WITH INTRAOPERATIVE CHOLANGIOGRAM;  Surgeon: Joyice Faster. Cornett, MD;  Location: [facility];  Service: General;  Laterality: N/A;  Laparoscopic Cholecystectomy with Cholangiogram  . COLONOSCOPY    . ECTOPIC PREGNANCY SURGERY  many yrs ago  . HARDWARE REMOVAL Right 07/15/2015   Procedure: RIGHT ANKLE HARDWARE REMOVAL, PLACEMENT OF STIMULAN ANTIBIOTIC BEADS AND PREVENA WOUND VAC.;  Surgeon: Meredith Pel, MD;  Location: [facility];  Service: Orthopedics;  Laterality: Right;  . TOOTH EXTRACTION N/A 12/03/2016   Procedure: DENTAL EXTRACTIONS with Alveoloplasty;  Surgeon: Diona Browner, DDS;  Location: [facility];  Service: Oral Surgery;  Laterality: N/A;      Social History:  reports that she has been smoking cigarettes and e-cigarettes. She started smoking about 3 years ago. She has been smoking about 0.00 packs per day for the past 20.00 years. She has never used smokeless tobacco. She reports that she does not drink alcohol and does not use drugs. Family History:  Family History  Problem Relation Age of Onset  . Lung cancer Father   . Heart attack Father 50       CABG x4  . Breast cancer Sister        Survivor       HOME  MEDICATIONS: Allergies as of 09/12/2020      Reactions   Biaxin [clarithromycin] Anaphylaxis, Swelling   Clarithromycin Shortness Of Breath, Swelling   Lisinopril Swelling, Cough   Lip edema   Sulfa Antibiotics Anaphylaxis, Shortness Of Breath, Swelling, Hypertension   Chlorthalidone Nausea And Vomiting, Other (See Comments)   Clammy, Tachycardia, Headache   Daptomycin Nausea And Vomiting   Latex Hives, Rash   Amoxicillin-pot Clavulanate Diarrhea   Has patient had a PCN reaction causing immediate rash, facial/tongue/throat swelling, SOB or lightheadedness with hypotension:No Has patient had a PCN reaction causing severe rash involving mucus membranes or skin necrosis:No Has patient had a PCN reaction that required hospitalization:No Has patient had a PCN reaction occurring within THE LAST 10 YEARS.  #  #  #  YES  #  #  #  If all of the above answers are "NO", then may proceed with Cephalosporin use.   Aspirin Nausea And Vomiting, Rash   On $R'325mg'bv$  dosage   Cholestyramine Nausea And Vomiting   Dilaudid [hydromorphone Hcl] Nausea And Vomiting   Lasix [furosemide] Nausea And Vomiting, Other (See Comments)   Headache       Medication List       Accurate as of September 12, 2020  7:27 AM. If you have any questions, ask your nurse or doctor.        Accu-Chek Aviva Plus test strip Generic drug: glucose blood USE ONE TEST SSTRIP FOR BLOOD SUGAR FOUR TIMES DAILY   Accu-Chek Guide w/Device Kit 1 each by Does not apply route 4 (four) times daily.   accu-chek softclix lancets Use as instructed daily.   albuterol 108 (90 Base) MCG/ACT inhaler Commonly known as: VENTOLIN HFA Inhale 2 puffs into the lungs every 4 (four) hours as needed for wheezing or shortness of breath.   atorvastatin 40 MG tablet Commonly known as: LIPITOR Take 1 tablet (40 mg total) by mouth daily.   dicyclomine 10 MG capsule Commonly known as: BENTYL Take 1 capsule (10 mg total) by mouth 3 (three) times daily  before meals.   diphenoxylate-atropine 2.5-0.025 MG tablet Commonly known as: LOMOTIL Take 1 tablet by mouth 4 (four) times daily as needed for diarrhea or loose stools.   doxycycline 100 MG tablet Commonly known as: VIBRA-TABS Take 1 tablet (100 mg total) by mouth 2 (two) times daily.   esomeprazole 20 MG capsule Commonly known as: NEXIUM Take 1 capsule (20 mg total) by mouth daily.   FLUoxetine 40 MG capsule Commonly known as: PROZAC Take 1 capsule (40 mg total) by mouth at bedtime.   gabapentin 300 MG capsule Commonly known as: NEURONTIN Take 1 capsule (300 mg total) by mouth 2 (two) times daily.   glimepiride 4 MG tablet Commonly known as: AMARYL Take 2  tablets (8 mg total) by mouth daily with breakfast.   hydrOXYzine 10 MG tablet Commonly known as: ATARAX/VISTARIL Take 1 tablet (10 mg total) by mouth 3 (three) times daily as needed.   meclizine 25 MG tablet Commonly known as: ANTIVERT Take 1 tablet (25 mg total) by mouth 3 (three) times daily as needed for dizziness.   metoprolol succinate 25 MG 24 hr tablet Commonly known as: TOPROL-XL Take 1 tablet (25 mg total) by mouth daily.   NovoLOG Mix 70/30 FlexPen (70-30) 100 UNIT/ML FlexPen Generic drug: insulin aspart protamine - aspart Inject subcutaneously 45 units in the morning and 35 units in the evening   tiotropium 18 MCG inhalation capsule Commonly known as: SPIRIVA Place 1 capsule (18 mcg total) into inhaler and inhale daily.   Unifine Pentips Plus 32G X 4 MM Misc Generic drug: Insulin Pen Needle USE TO administer lantus AND humlog TWICE DAILY        ALLERGIES: Allergies  Allergen Reactions  . Biaxin [Clarithromycin] Anaphylaxis and Swelling  . Clarithromycin Shortness Of Breath and Swelling  . Lisinopril Swelling and Cough    Lip edema  . Sulfa Antibiotics Anaphylaxis, Shortness Of Breath, Swelling and Hypertension  . Chlorthalidone Nausea And Vomiting and Other (See Comments)    Clammy,  Tachycardia, Headache   . Daptomycin Nausea And Vomiting  . Latex Hives and Rash  . Amoxicillin-Pot Clavulanate Diarrhea    Has patient had a PCN reaction causing immediate rash, facial/tongue/throat swelling, SOB or lightheadedness with hypotension:No Has patient had a PCN reaction causing severe rash involving mucus membranes or skin necrosis:No Has patient had a PCN reaction that required hospitalization:No Has patient had a PCN reaction occurring within THE LAST 10 YEARS.  #  #  #  YES  #  #  #  If all of the above answers are "NO", then may proceed with Cephalosporin use.   . Aspirin Nausea And Vomiting and Rash    On $R'325mg'oz$  dosage  . Cholestyramine Nausea And Vomiting  . Dilaudid [Hydromorphone Hcl] Nausea And Vomiting  . Lasix [Furosemide] Nausea And Vomiting and Other (See Comments)    Headache      REVIEW OF SYSTEMS: A comprehensive ROS was conducted with the patient and is negative except as per HPI and below:  ROS    OBJECTIVE:   VITAL SIGNS: LMP 07/24/2011    PHYSICAL EXAM:  General: Pt appears well and is in NAD  Hydration: Well-hydrated with moist mucous membranes and good skin turgor  HEENT: Head: Unremarkable with good dentition. Oropharynx clear without exudate.  Eyes: External eye exam normal without stare, lid lag or exophthalmos.  EOM intact.  PERRL.  Neck: General: Supple without adenopathy or carotid bruits. Thyroid: Thyroid size normal.  No goiter or nodules appreciated. No thyroid bruit.  Lungs: Clear with good BS bilat with no rales, rhonchi, or wheezes  Heart: RRR with normal S1 and S2 and no gallops; no murmurs; no rub  Abdomen: Normoactive bowel sounds, soft, nontender, without masses or organomegaly palpable  Extremities:  Lower extremities - No pretibial edema. No lesions.  Skin: Normal texture and temperature to palpation. No rash noted. No Acanthosis nigricans/skin tags. No lipohypertrophy.  Neuro: MS is good with appropriate affect, pt is  alert and Ox3    DM foot exam:    DATA REVIEWED:  Lab Results  Component Value Date   HGBA1C 13.4 (A) 07/15/2020   HGBA1C 11.6 (A) 04/02/2020   HGBA1C 12.4 (A) 11/16/2019  Lab Results  Component Value Date   LDLCALC 77 07/31/2019   CREATININE 0.63 07/11/2020   Lab Results  Component Value Date   MICRALBCREAT 13 07/31/2019    Lab Results  Component Value Date   CHOL 151 07/31/2019   HDL 27 (L) 07/31/2019   LDLCALC 77 07/31/2019   TRIG 287 (H) 07/31/2019   CHOLHDL 5.6 (H) 07/31/2019        ASSESSMENT / PLAN / RECOMMENDATIONS:   1) Type *** Diabetes Mellitus, ***controlled, With*** complications - Most recent A1c of *** %. Goal A1c < *** %.  ***  Plan: GENERAL:  ***  MEDICATIONS:  ***  EDUCATION / INSTRUCTIONS:  BG monitoring instructions: Patient is instructed to check her blood sugars *** times a day, ***.  Call [facility] if: BG persistently < 70 or > 300. . I reviewed the Rule of 15 for the treatment of hypoglycemia in detail with the patient. Literature supplied.   2) Diabetic complications:   Eye: Does *** have known diabetic retinopathy.   Neuro/ Feet: Does *** have known diabetic peripheral neuropathy.  Renal: Patient does not have known baseline CKD. She is *** on an ACEI/ARB at present.  3) Mixed Dyslipidemia:   - LDL at goa but Tg elevated   - Patient is on Atorvastatin 40 mg daily         Signed electronically by: Mack Guise, MD  [facility] End[facility]  [facility] [address] [place], [place] [address] Phone: [phone] FAX: [phone]   CC: Charlott Rakes, [place] [address] Phone: [phone]  Fax: [phone]    Return to Endocrinology clinic as below: Future Appointments  Date Time Provider [place]  09/12/2020 10:30 AM Hadlee Burback, Melanie Crazier, MD [id number] None

## 2020-09-22 ENCOUNTER — Other Ambulatory Visit: Payer: Self-pay | Admitting: Family Medicine

## 2020-09-22 DIAGNOSIS — K580 Irritable bowel syndrome with diarrhea: Secondary | ICD-10-CM

## 2020-09-22 DIAGNOSIS — F419 Anxiety disorder, unspecified: Secondary | ICD-10-CM

## 2020-09-22 NOTE — Telephone Encounter (Signed)
Requested Prescriptions  Pending Prescriptions Disp Refills  . diphenoxylate-atropine (LOMOTIL) 2.5-0.025 MG tablet [Pharmacy Med Name: diphenoxylate-atropine 2.5 mg-0.025 mg tablet] 30 tablet     Sig: Take 1 tablet by mouth 4 (four) times daily as needed for diarrhea or loose stools.     Not Delegated - Gastroenterology:  Antidiarrheals Failed - 09/22/2020 12:01 PM      Failed - This refill cannot be delegated      Passed - Valid encounter within last 12 months    Recent Outpatient Visits          5 months ago Type 2 diabetes mellitus with hyperglycemia, with long-term current use of insulin (HCC)   [facility] [place], [place], MD   10 months ago Type 2 diabetes mellitus with hyperglycemia, with long-term current use of insulin (HCC)   [facility] [place], [place], MD   1 year ago Type 2 diabetes mellitus with hyperglycemia, with long-term current use of insulin (HCC)   [facility] [place], Odette Horns, MD   1 year ago Irritable bowel syndrome with diarrhea   [facility] [place], Odette Horns, MD   2 years ago Type 2 diabetes mellitus with hyperglycemia, with long-term current use of insulin (HCC)   [facility] [place], [place], MD             . hydrOXYzine (ATARAX/VISTARIL) 10 MG tablet [Pharmacy Med Name: hydroxyzine HCl 10 mg tablet] 60 tablet 1    Sig: Take 1 tablet (10 mg total) by mouth 3 (three) times daily as needed.     Ear, Nose, and Throat:  Antihistamines Passed - 09/22/2020 12:01 PM      Passed - Valid encounter within last 12 months    Recent Outpatient Visits          5 months ago Type 2 diabetes mellitus with hyperglycemia, with long-term current use of insulin (HCC)   [facility] [place], [place], MD   10 months ago Type 2 diabetes mellitus with hyperglycemia, with long-term current use of insulin  (HCC)   [facility] [place], [place], MD   1 year ago Type 2 diabetes mellitus with hyperglycemia, with long-term current use of insulin (HCC)   [facility] Hoy Register, MD   1 year ago Irritable bowel syndrome with diarrhea    [facility] [place], Odette Horns, MD   2 years ago Type 2 diabetes mellitus with hyperglycemia, with long-term current use of insulin [facility])    [facility] Hoy Register, MD

## 2020-09-22 NOTE — Telephone Encounter (Signed)
Requested medication (s) are due for refill today: yes  Requested medication (s) are on the active medication list: yes  Last refill:  07/30/20  Future visit scheduled: no  Notes to clinic:  med not delegated to NT to RF   Requested Prescriptions  Pending Prescriptions Disp Refills   diphenoxylate-atropine (LOMOTIL) 2.5-0.025 MG tablet [Pharmacy Med Name: diphenoxylate-atropine 2.5 mg-0.025 mg tablet] 30 tablet     Sig: Take 1 tablet by mouth 4 (four) times daily as needed for diarrhea or loose stools.      Not Delegated - Gastroenterology:  Antidiarrheals Failed - 09/22/2020 12:01 PM      Failed - This refill cannot be delegated      Passed - Valid encounter within last 12 months    Recent Outpatient Visits           5 months ago Type 2 diabetes mellitus with hyperglycemia, with long-term current use of insulin (HCC)   [facility] [place], [place], MD   10 months ago Type 2 diabetes mellitus with hyperglycemia, with long-term current use of insulin (HCC)   [facility] [place], [place], MD   1 year ago Type 2 diabetes mellitus with hyperglycemia, with long-term current use of insulin (HCC)   [facility] [place], Odette Horns, MD   1 year ago Irritable bowel syndrome with diarrhea   [facility] [place], Odette Horns, MD   2 years ago Type 2 diabetes mellitus with hyperglycemia, with long-term current use of insulin (HCC)   [facility] [place], Odette Horns, MD                 Signed Prescriptions Disp Refills   hydrOXYzine (ATARAX/VISTARIL) 10 MG tablet 60 tablet 1    Sig: Take 1 tablet (10 mg total) by mouth 3 (three) times daily as needed.      Ear, Nose, and Throat:  Antihistamines Passed - 09/22/2020 12:01 PM      Passed - Valid encounter within last 12 months    Recent Outpatient Visits           5 months ago Type 2 diabetes mellitus  with hyperglycemia, with long-term current use of insulin (HCC)   [facility] [place], [place], MD   10 months ago Type 2 diabetes mellitus with hyperglycemia, with long-term current use of insulin (HCC)   [facility] [place], [place], MD   1 year ago Type 2 diabetes mellitus with hyperglycemia, with long-term current use of insulin (HCC)   [facility] Hoy Register, MD   1 year ago Irritable bowel syndrome with diarrhea   [facility] [place], Odette Horns, MD   2 years ago Type 2 diabetes mellitus with hyperglycemia, with long-term current use of insulin [facility])   [facility] Hoy Register, MD

## 2020-10-08 ENCOUNTER — Other Ambulatory Visit: Payer: Self-pay | Admitting: Family Medicine

## 2020-10-08 DIAGNOSIS — K580 Irritable bowel syndrome with diarrhea: Secondary | ICD-10-CM

## 2020-10-25 ENCOUNTER — Encounter (HOSPITAL_COMMUNITY): Payer: Self-pay | Admitting: Emergency Medicine

## 2020-10-25 ENCOUNTER — Emergency Department (HOSPITAL_COMMUNITY)
Admission: EM | Admit: 2020-10-25 | Discharge: 2020-10-25 | Disposition: A | Discharge status: Home / Self Care | Payer: Medicaid Other | Attending: Emergency Medicine | Admitting: Emergency Medicine

## 2020-10-25 ENCOUNTER — Emergency Department (HOSPITAL_COMMUNITY): Payer: Medicaid Other

## 2020-10-25 DIAGNOSIS — Z91040 Latex allergy status: Secondary | ICD-10-CM | POA: Diagnosis not present

## 2020-10-25 DIAGNOSIS — Z79899 Other long term (current) drug therapy: Secondary | ICD-10-CM | POA: Insufficient documentation

## 2020-10-25 DIAGNOSIS — R0789 Other chest pain: Secondary | ICD-10-CM | POA: Diagnosis not present

## 2020-10-25 DIAGNOSIS — R059 Cough, unspecified: Secondary | ICD-10-CM | POA: Insufficient documentation

## 2020-10-25 DIAGNOSIS — R062 Wheezing: Secondary | ICD-10-CM | POA: Diagnosis not present

## 2020-10-25 DIAGNOSIS — Z7984 Long term (current) use of oral hypoglycemic drugs: Secondary | ICD-10-CM | POA: Diagnosis not present

## 2020-10-25 DIAGNOSIS — J449 Chronic obstructive pulmonary disease, unspecified: Secondary | ICD-10-CM | POA: Insufficient documentation

## 2020-10-25 DIAGNOSIS — R202 Paresthesia of skin: Secondary | ICD-10-CM | POA: Diagnosis not present

## 2020-10-25 DIAGNOSIS — F17210 Nicotine dependence, cigarettes, uncomplicated: Secondary | ICD-10-CM | POA: Diagnosis not present

## 2020-10-25 DIAGNOSIS — I5022 Chronic systolic (congestive) heart failure: Secondary | ICD-10-CM | POA: Diagnosis not present

## 2020-10-25 DIAGNOSIS — R079 Chest pain, unspecified: Secondary | ICD-10-CM | POA: Diagnosis present

## 2020-10-25 DIAGNOSIS — I110 Hypertensive heart disease with heart failure: Secondary | ICD-10-CM | POA: Insufficient documentation

## 2020-10-25 DIAGNOSIS — E119 Type 2 diabetes mellitus without complications: Secondary | ICD-10-CM | POA: Diagnosis not present

## 2020-10-25 DIAGNOSIS — Z20822 Contact with and (suspected) exposure to covid-19: Secondary | ICD-10-CM | POA: Insufficient documentation

## 2020-10-25 DIAGNOSIS — Z794 Long term (current) use of insulin: Secondary | ICD-10-CM | POA: Diagnosis not present

## 2020-10-25 LAB — CBC
HCT: 46.4 % — ABNORMAL HIGH (ref 36.0–46.0)
Hemoglobin: 15.2 g/dL — ABNORMAL HIGH (ref 12.0–15.0)
MCH: 28.7 pg (ref 26.0–34.0)
MCHC: 32.8 g/dL (ref 30.0–36.0)
MCV: 87.7 fL (ref 80.0–100.0)
Platelets: 215 10*3/uL (ref 150–400)
RBC: 5.29 MIL/uL — ABNORMAL HIGH (ref 3.87–5.11)
RDW: 12.3 % (ref 11.5–15.5)
WBC: 12.5 10*3/uL — ABNORMAL HIGH (ref 4.0–10.5)
nRBC: 0 % (ref 0.0–0.2)

## 2020-10-25 LAB — BASIC METABOLIC PANEL
Anion gap: 9 (ref 5–15)
BUN: 11 mg/dL (ref 6–20)
CO2: 25 mmol/L (ref 22–32)
Calcium: 8.9 mg/dL (ref 8.9–10.3)
Chloride: 97 mmol/L — ABNORMAL LOW (ref 98–111)
Creatinine, Ser: 0.64 mg/dL (ref 0.44–1.00)
GFR, Estimated: >60 mL/min (ref >60)
Glucose, Bld: 497 mg/dL — ABNORMAL HIGH (ref 70–99)
Potassium: 3.8 mmol/L (ref 3.5–5.1)
Sodium: 131 mmol/L — ABNORMAL LOW (ref 135–145)

## 2020-10-25 LAB — RESP PANEL BY RT-PCR (FLU A&B, COVID) ARPGX2
Influenza A by PCR: NEGATIVE
Influenza B by PCR: NEGATIVE
SARS Coronavirus 2 by RT PCR: NEGATIVE

## 2020-10-25 LAB — TROPONIN I (HIGH SENSITIVITY)
Troponin I (High Sensitivity): 3 ng/L (ref <18)
Troponin I (High Sensitivity): 3 ng/L (ref <18)

## 2020-10-25 LAB — D-DIMER, QUANTITATIVE: D-Dimer, Quant: 3.01 ug/mL-FEU — ABNORMAL HIGH (ref 0.00–0.50)

## 2020-10-25 LAB — I-STAT BETA HCG BLOOD, ED (MC, WL, AP ONLY): I-stat hCG, quantitative: <5 m[IU]/mL (ref <5)

## 2020-10-25 MED ORDER — ONDANSETRON HCL 4 MG/2ML IJ SOLN
4.0000 mg | Freq: Once | INTRAMUSCULAR | Status: AC
Start: 2020-10-25 — End: 2020-10-25
  Administered 2020-10-25: 4 mg via INTRAVENOUS
  Filled 2020-10-25: qty 2

## 2020-10-25 MED ORDER — BENZONATATE 100 MG PO CAPS
200.0000 mg | ORAL_CAPSULE | Freq: Once | ORAL | Status: AC
  Administered 2020-10-25: 200 mg via ORAL
  Filled 2020-10-25: qty 2

## 2020-10-25 MED ORDER — SODIUM CHLORIDE 0.9 % IV BOLUS
1000.0000 mL | Freq: Once | INTRAVENOUS | Status: AC
  Administered 2020-10-25: 1000 mL via INTRAVENOUS

## 2020-10-25 MED ORDER — ALBUTEROL SULFATE HFA 108 (90 BASE) MCG/ACT IN AERS
4.0000 | INHALATION_SPRAY | Freq: Once | RESPIRATORY_TRACT | Status: AC
  Administered 2020-10-25: 4 via RESPIRATORY_TRACT
  Filled 2020-10-25: qty 6.7

## 2020-10-25 MED ORDER — BENZONATATE 200 MG PO CAPS: 200.0000 mg | ORAL_CAPSULE | Freq: Three times a day (TID) | ORAL | 0 refills | Status: DC

## 2020-10-25 MED ORDER — IOHEXOL 350 MG/ML SOLN
65.0000 mL | Freq: Once | INTRAVENOUS | Status: AC | PRN
  Administered 2020-10-25: 65 mL via INTRAVENOUS

## 2020-10-25 NOTE — ED Triage Notes (Signed)
C/o cough with green phlegm since Friday.  Seen at an urgent care center and taking a steroid and antibiotic.  Reports L sided thoracic back pain and L sided chest pain today with SOB.

## 2020-10-25 NOTE — ED Notes (Signed)
Pt transported to CT ?

## 2020-10-25 NOTE — Discharge Instructions (Signed)
Your evaluation was reassuring today, CT shows no evidence of blood clot or pneumonia, cardiac work-up was reassuring, suspect pain and paresthesias over your back and chest wall are likely due to recurrent cough.  Suspect likely viral bronchitis worsened by your history of COPD.  Complete antibiotics that were prescribed by urgent care.  To help with cough use prescribed Tessalon Perles.  To help with nasal congestion you can use saline rinses and Flonase nasal spray.  If symptoms or not improving over the next week please follow-up with your primary care doctor.  Return if you develop worsening chest pain or shortness of breath, fevers, or any other new or concerning symptoms.

## 2020-10-25 NOTE — ED Notes (Signed)
Pt returned from CT °

## 2020-10-25 NOTE — ED Notes (Signed)
Patient verbalizes understanding of discharge instructions. Prescriptions and follow-up care reviewed. Opportunity for questioning and answers were provided. Armband removed by staff, pt discharged from ED ambulatory.  

## 2020-10-25 NOTE — ED Provider Notes (Signed)
[name] EMERGENCY DEPARTMENT Provider Note   CSN: [id number] Arrival date & time: 10/25/20  1702     History Chief Complaint  Patient presents with  . Cough  . Chest Pain    [name] is a 60 y.o. female.  [name] is a 60 y.o. female with a history of atrial fibrillation, CHF, diabetes, GERD, hypertension, osteomyelitis presents to the ED for evaluation of cough and chest pain.  Symptoms started about 1 week ago.  Patient reports frequent cough with green sputum as well as nasal congestion and rhinorrhea.  She reports she also started having pain over the left chest and left thoracic back.  She reports a mild tingling sensation over the left thoracic back that started shortly after her cough.  She has not had fevers or chills.  Reports pain has not been moving and she has a persistent on feeling in her chest.  No palpitations.  No lightheadedness or syncope.  No abdominal pain.  Does report some nausea but no vomiting and no diarrhea.  No known sick contacts.  Went to urgent care a few days ago and was prescribed doxycycline and steroids, she reports despite taking these and Mucinex she has had no improvement in her symptoms.  No other aggravating or alleviating factors.        Past Medical History:  Diagnosis Date  . Anxiety   . Arthritis   . Chronic systolic heart failure (HCC)    EF 20-25% ECHO 05/2015  . COPD (chronic obstructive pulmonary disease) ([place])   . Depression   . Diabetes mellitus without complication ([place])    Type II  . Dysrhythmia    pt unsure of name of arrythmia - " my heart rate will drop all of a sudden" -no current treatment - atrial flutter  . Fibrillation, atrial ([place])   . Gallstones   . GERD (gastroesophageal reflux disease)   . Headache(784.0)    migraines  . Hypertension   . Osteomyelitis (HCC)    R ankle  . Rash    arms    Patient Active Problem List   Diagnosis Date Noted  . DDD lumbar spine  10/18/2016  . Primary osteoarthritis of both hands 10/06/2016  . History of gastroesophageal reflux (GERD) 10/06/2016  . History of COPD 10/06/2016  . History of anxiety 10/06/2016  . History of CHF (congestive heart failure) 10/06/2016  . History of hypertension 10/06/2016  . History of cardiac dysrhythmia 10/06/2016  . Primary osteoarthritis of both feet 10/06/2016  . Primary osteoarthritis of both knees 10/06/2016  . History of infection/ ankle post operative  10/06/2016  . Rheumatoid factor positive 10/06/2016  . De Quervain's disease (radial styloid tenosynovitis) 06/09/2016  . Tobacco abuse 05/23/2016  . Hyperglycemia without ketosis   . Bleeding hemorrhoids 11/03/2015  . Acute blood loss anemia 11/03/2015  . Hematochezia 11/01/2015  . Back pain with sciatica 09/08/2015  . Infection of bone of ankle ([place]) 07/15/2015  . Chronic systolic CHF (congestive heart failure) ([place]) 06/24/2015  . Chest pain 06/03/2015  . Hypertension   . Anxiety   . Gallstones   . GERD (gastroesophageal reflux disease)   . COPD (chronic obstructive pulmonary disease) ([place])   . Dysrhythmia   . Rash   . Depression   . Diabetes mellitus without complication ([place])   . Post-operative state 12/10/2011    Past Surgical History:  Procedure Laterality Date  . ANKLE FRACTURE SURGERY Right 2015  . CARDIAC CATHETERIZATION  N/A 06/24/2015   Procedure: Left Heart Cath and Coronary Angiography;  Surgeon: Belva Crome, MD;  Location: [facility];  Service: Cardiovascular;  Laterality: N/A;  . CARPAL TUNNEL RELEASE     bil  . CHOLECYSTECTOMY  11/24/2011   Procedure: LAPAROSCOPIC CHOLECYSTECTOMY WITH INTRAOPERATIVE CHOLANGIOGRAM;  Surgeon: Joyice Faster. [place], MD;  Location: [facility];  Service: General;  Laterality: N/A;  Laparoscopic Cholecystectomy with Cholangiogram  . COLONOSCOPY    . ECTOPIC PREGNANCY SURGERY  many yrs ago  . HARDWARE REMOVAL Right 07/15/2015   Procedure: RIGHT ANKLE HARDWARE REMOVAL,  PLACEMENT OF STIMULAN ANTIBIOTIC BEADS AND PREVENA WOUND VAC.;  Surgeon: Meredith Pel, MD;  Location: [facility];  Service: Orthopedics;  Laterality: Right;  . TOOTH EXTRACTION N/A 12/03/2016   Procedure: DENTAL EXTRACTIONS with Alveoloplasty;  Surgeon: Diona Browner, DDS;  Location: [facility];  Service: Oral Surgery;  Laterality: N/A;     OB History    Gravida  9   Para      Term      Preterm      AB  5   Living  4     SAB  4   IAB      Ectopic  1   Multiple      Live Births  4           Family History  Problem Relation Age of Onset  . Lung cancer Father   . Heart attack Father 4       CABG x4  . Breast cancer Sister        Survivor     Social History   Tobacco Use  . Smoking status: Light Tobacco Smoker    Packs/day: 0.00    Years: 20.00    Pack years: 0.00    Types: Cigarettes, E-cigarettes    Start date: 09/19/2016  . Smokeless tobacco: Never Used  Vaping Use  . Vaping Use: Every day  . Start date: 10/12/2016  Substance Use Topics  . Alcohol use: No    Alcohol/week: 0.0 standard drinks  . Drug use: No    Home Medications Prior to Admission medications   Medication Sig Start Date End Date Taking? Authorizing Provider  benzonatate (TESSALON) 200 MG capsule Take 1 capsule (200 mg total) by mouth every 8 (eight) hours. 10/25/20  Yes Chaunice Obie N, [name]  ACCU-CHEK AVIVA PLUS test strip USE ONE TEST SSTRIP FOR BLOOD SUGAR FOUR TIMES DAILY 08/26/20   Charlott Rakes, MD  albuterol (VENTOLIN HFA) 108 (90 Base) MCG/ACT inhaler Inhale 2 puffs into the lungs every 4 (four) hours as needed for wheezing or shortness of breath. 02/14/20   Charlott Rakes, MD  atorvastatin (LIPITOR) 40 MG tablet Take 1 tablet (40 mg total) by mouth daily. 09/08/20   Charlott Rakes, MD  Blood Glucose Monitoring Suppl (ACCU-CHEK GUIDE) w/Device KIT 1 each by Does not apply route 4 (four) times daily. 05/22/18   Charlott Rakes, MD  dicyclomine (BENTYL) 10 MG capsule Take 1 capsule (10  mg total) by mouth 3 (three) times daily before meals. 04/02/20   Charlott Rakes, MD  diphenoxylate-atropine (LOMOTIL) 2.5-0.025 MG tablet Take 1 tablet by mouth 4 (four) times daily as needed for diarrhea or loose stools. 09/23/20   Charlott Rakes, MD  doxycycline (VIBRA-TABS) 100 MG tablet Take 1 tablet (100 mg total) by mouth 2 (two) times daily. Patient not taking: Reported on 04/02/2020 11/16/19   Charlott Rakes, MD  esomeprazole (NEXIUM) 20 MG capsule  Take 1 capsule (20 mg total) by mouth daily. 04/02/20   Charlott Rakes, MD  FLUoxetine (PROZAC) 40 MG capsule Take 1 capsule (40 mg total) by mouth at bedtime. 08/14/20   Charlott Rakes, MD  gabapentin (NEURONTIN) 300 MG capsule Take 1 capsule (300 mg total) by mouth 2 (two) times daily. 06/30/20   Charlott Rakes, MD  glimepiride (AMARYL) 4 MG tablet Take 2 tablets (8 mg total) by mouth daily with breakfast. 09/08/20   Charlott Rakes, MD  hydrOXYzine (ATARAX/VISTARIL) 10 MG tablet Take 1 tablet (10 mg total) by mouth 3 (three) times daily as needed. 09/22/20   Charlott Rakes, MD  insulin aspart protamine - aspart (NOVOLOG MIX 70/30 FLEXPEN) (70-30) 100 UNIT/ML FlexPen Inject subcutaneously 45 units in the morning and 35 units in the evening 07/15/20   Charlott Rakes, MD  Lancet Devices [facility]) lancets Use as instructed daily. 11/24/16   Charlott Rakes, MD  meclizine (ANTIVERT) 25 MG tablet Take 1 tablet (25 mg total) by mouth 3 (three) times daily as needed for dizziness. 09/04/20   Charlott Rakes, MD  metoprolol succinate (TOPROL-XL) 25 MG 24 hr tablet Take 1 tablet (25 mg total) by mouth daily. 04/02/20   Charlott Rakes, MD  tiotropium (SPIRIVA) 18 MCG inhalation capsule Place 1 capsule (18 mcg total) into inhaler and inhale daily. Patient not taking: Reported on 11/16/2019 07/30/19   Charlott Rakes, MD  UNIFINE PENTIPS PLUS 32G X 4 MM MISC USE TO administer lantus AND humlog TWICE DAILY 07/30/20   Charlott Rakes, MD    Allergies     Biaxin [clarithromycin], Clarithromycin, Lisinopril, Sulfa antibiotics, Chlorthalidone, Daptomycin, Latex, Amoxicillin-pot clavulanate, Aspirin, Cholestyramine, Dilaudid [hydromorphone hcl], and Lasix [furosemide]  Review of Systems   Review of Systems  Constitutional: Negative for chills and fever.  HENT: Positive for congestion and rhinorrhea. Negative for sore throat.   Respiratory: Positive for cough and shortness of breath.   Cardiovascular: Positive for chest pain. Negative for palpitations and leg swelling.  Gastrointestinal: Positive for nausea. Negative for abdominal pain, diarrhea and vomiting.  Genitourinary: Negative for dysuria.  Musculoskeletal: Positive for myalgias. Negative for arthralgias.  Skin: Negative for color change and rash.  Neurological: Positive for headaches. Negative for dizziness, syncope and light-headedness.    Physical Exam Updated Vital Signs BP (!) 141/80 (BP Location: Right Arm)   Pulse (!) 101   Temp 99.4 F (37.4 C) (Oral)   Resp (!) 24   LMP 07/24/2011   SpO2 98%   Physical Exam Vitals and nursing note reviewed.  Constitutional:      General: She is not in acute distress.    Appearance: She is well-developed. She is not ill-appearing or diaphoretic.  HENT:     Head: Normocephalic and atraumatic.  Eyes:     General:        Right eye: No discharge.        Left eye: No discharge.     Pupils: Pupils are equal, round, and reactive to light.  Cardiovascular:     Rate and Rhythm: Normal rate and regular rhythm.     Pulses:          Radial pulses are 2+ on the right side and 2+ on the left side.     Heart sounds: Normal heart sounds. No murmur heard. No friction rub. No gallop.   Pulmonary:     Effort: Pulmonary effort is normal. No respiratory distress.     Breath sounds: Wheezing present. No rales.  Comments: Respirations are equal and unlabored, frequent cough during exam, patient able to speak in full sentences, on auscultation  some scattered wheezes noted, no rales or rhonchi Chest:     Chest wall: No tenderness.     Comments: No anterior chest wall tenderness Abdominal:     General: Bowel sounds are normal. There is no distension.     Palpations: Abdomen is soft. There is no mass.     Tenderness: There is no abdominal tenderness. There is no guarding.     Comments: Abdomen soft, nondistended, nontender to palpation in all quadrants without guarding or peritoneal signs   Musculoskeletal:        General: No deformity.     Cervical back: Neck supple.     Right lower leg: No tenderness. No edema.     Left lower leg: No tenderness. No edema.     Comments: Patient reports some paresthesias over the left thoracic back, mild tenderness to palpation without overlying skin changes or palpable deformity  Skin:    General: Skin is warm and dry.     Capillary Refill: Capillary refill takes less than 2 seconds.  Neurological:     Mental Status: She is alert.     Coordination: Coordination normal.     Comments: Speech is clear, able to follow commands Moves extremities without ataxia, coordination intact  Psychiatric:        Mood and Affect: Mood normal.        Behavior: Behavior normal.     ED Results / Procedures / Treatments   Labs (all labs ordered are listed, but only abnormal results are displayed) Labs Reviewed  BASIC METABOLIC PANEL - Abnormal; Notable for the following components:      Result Value   Sodium 131 (*)    Chloride 97 (*)    Glucose, Bld 497 (*)    All other components within normal limits  CBC - Abnormal; Notable for the following components:   WBC 12.5 (*)    RBC 5.29 (*)    Hemoglobin 15.2 (*)    HCT 46.4 (*)    All other components within normal limits  D-DIMER, QUANTITATIVE - Abnormal; Notable for the following components:   D-Dimer, Quant 3.01 (*)    All other components within normal limits  RESP PANEL BY RT-PCR (FLU A&B, COVID) ARPGX2  I-STAT BETA HCG BLOOD, ED ([facility], [facility], [facility]  ONLY)  TROPONIN I (HIGH SENSITIVITY)  TROPONIN I (HIGH SENSITIVITY)    EKG EKG Interpretation  Date/Time:  Saturday October 25 2020 20:46:02 EDT Ventricular Rate:  81 PR Interval:  150 QRS Duration: 130 QT Interval:  418 QTC Calculation: 486 R Axis:   -25 Text Interpretation: Sinus rhythm Probable left atrial enlargement Left bundle branch block No significant change from Dec 11 2018 ecg Confirmed by Octaviano Glow [phone]) on 10/25/2020 9:19:06 PM   Radiology DG Chest 2 View  Result Date: 10/25/2020 CLINICAL DATA:  Chest pain and shortness of breath EXAM: CHEST - 2 VIEW COMPARISON:  Chest x-ray dated 10/08/2017. FINDINGS: Heart size and mediastinal contours are within normal limits. Lungs are clear. No pleural effusion or pneumothorax is seen. Osseous structures about the chest are unremarkable. IMPRESSION: No active cardiopulmonary disease. No evidence of pneumonia or pulmonary edema. Electronically Signed   By: Franki Cabot M.D.   On: 10/25/2020 17:47   CT Angio Chest PE W and/or Wo Contrast  Result Date: 10/25/2020 CLINICAL DATA:  Left-sided chest numbness x2 days with shortness  of breath. EXAM: CT ANGIOGRAPHY CHEST WITH CONTRAST TECHNIQUE: Multidetector CT imaging of the chest was performed using the standard protocol during bolus administration of intravenous contrast. Multiplanar CT image reconstructions and MIPs were obtained to evaluate the vascular anatomy. CONTRAST:  36m OMNIPAQUE IOHEXOL 350 MG/ML SOLN COMPARISON:  October 25, 2013 FINDINGS: Cardiovascular: There is mild to moderate severity calcification of the aortic arch without evidence of aortic aneurysm. Satisfactory opacification of the pulmonary arteries to the segmental level. No evidence of pulmonary embolism. Normal heart size. No pericardial effusion. Mediastinum/Nodes: No enlarged mediastinal, hilar, or axillary lymph nodes. Thyroid gland, trachea, and esophagus demonstrate no significant findings. Lungs/Pleura: Very  mild atelectatic changes are seen within the posterior aspects of both lungs. There is no evidence of acute infiltrate, pleural effusion or pneumothorax. Upper Abdomen: Surgical clips are seen within the gallbladder fossa. Musculoskeletal: No chest wall abnormality. No acute or significant osseous findings. Review of the MIP images confirms the above findings. IMPRESSION: 1. No evidence of pulmonary embolism. 2. Very mild bilateral atelectatic changes. 3. Evidence of prior cholecystectomy. Electronically Signed   By: TVirgina NorfolkM.D.   On: 10/25/2020 23:05    Procedures Procedures   Medications Ordered in ED Medications  albuterol (VENTOLIN HFA) 108 (90 Base) MCG/ACT inhaler 4 puff (4 puffs Inhalation Given 10/25/20 2110)  benzonatate (TESSALON) capsule 200 mg (200 mg Oral Given 10/25/20 2110)  sodium chloride 0.9 % bolus 1,000 mL (0 mLs Intravenous Stopped 10/25/20 2219)  ondansetron (ZOFRAN) injection 4 mg (4 mg Intravenous Given 10/25/20 2110)  iohexol (OMNIPAQUE) 350 MG/ML injection 65 mL (65 mLs Intravenous Contrast Given 10/25/20 2228)    ED Course  I have reviewed the triage vital signs and the nursing notes.  Pertinent labs & imaging results that were available during my care of the patient were reviewed by me and considered in my medical decision making (see chart for details).    MDM Rules/Calculators/A&P                         Patient presents to the emergency department with chest pain, shortness of breath, cough. Patient nontoxic appearing, in no apparent distress, patient mildly tachycardic on arrival and mildly tachypneic. Fairly benign physical exam aside from frequent cough and faint wheezes.    DDX: ACS, pulmonary embolism, dissection, pneumothorax, pneumonia, COPD, bronchitis, arrhythmia, severe anemia, MSK, GERD, anxiety, abdominal process.   Additional history obtained:  Additional history obtained from chart review & nursing note review.   EKG: Sinus rhythm with  no significant change when compared to previous  Lab Tests:  I Ordered, reviewed, and interpreted labs, which included:  CBC: Mild leukocytosis of 12.5, but patient recently on steroids, hemoglobin mildly elevated, she has had decreased p.o. intake and may also be a bit hemoconcentrated BMP: Mild hyponatremia and hypochloremia, IV fluids given, glucose of 497 on arrival, IV fluid should help to improve this as well, normal function, no anion gap and CO2 normal, no concern for DKA Troponin: Negative x2 D-dimer: Elevated at 3.01  Imaging Studies ordered:  I ordered imaging studies which included CXR & CTA PE study I independently reviewed, formal radiology impression shows:  CXR: No evidence of pneumonia or pulmonary edema, no other active cardiopulmonary disease CTA: No evidence of PE, mild bilateral atelectasis, no other acute findings  ED Course:   EKG without obvious acute ischemia, delta troponin negative, doubt ACS.  D-dimer was elevated but CTA without evidence of PE, no  evidence of pneumonia. Pain is not a tearing sensation, symmetric pulses, no widening of mediastinum on CXR, doubt dissection. Cardiac monitor reviewed, no notable arrhythmias or tachycardia. Patient has appeared hemodynamically stable throughout ER visit and appears safe for discharge with close PCP/cardiology follow up.  Suspect bronchitis we will continue supportive treatment.  Suspect paresthesias over the left side of the back in setting of recurrent cough and chest wall inflammation.  I discussed results, treatment plan, need for PCP follow-up, and return precautions with the patient. Provided opportunity for questions, patient confirmed understanding and is in agreement with plan.    Portions of this note were generated with [employer]. Dictation errors may occur despite best attempts at proofreading.   Final Clinical Impression(s) / ED Diagnoses Final diagnoses:  Cough  Chest wall pain   Paresthesia    Rx / DC Orders ED Discharge Orders         Ordered    benzonatate (TESSALON) 200 MG capsule  Every 8 hours        10/25/20 2331           Jacqlyn Larsen, [name] 10/27/20 0153    Wyvonnia Dusky, MD 10/27/20 [phone]

## 2020-11-04 ENCOUNTER — Other Ambulatory Visit: Payer: Self-pay | Admitting: Family Medicine

## 2020-11-04 DIAGNOSIS — F419 Anxiety disorder, unspecified: Secondary | ICD-10-CM

## 2020-11-07 ENCOUNTER — Other Ambulatory Visit: Payer: Self-pay | Admitting: Family Medicine

## 2020-11-07 DIAGNOSIS — Z794 Long term (current) use of insulin: Secondary | ICD-10-CM

## 2020-11-07 NOTE — Telephone Encounter (Signed)
Requested medication (s) are due for refill today: yes  Requested medication (s) are on the active medication list: yes  Last refill:  08/09/2020  Future visit scheduled: no  Notes to clinic:  due for follow uup Vm left for patient    Requested Prescriptions  Pending Prescriptions Disp Refills   atorvastatin (LIPITOR) 40 MG tablet [Pharmacy Med Name: atorvastatin 40 mg tablet] 90 tablet 1    Sig: Take 1 tablet (40 mg total) by mouth daily.      Cardiovascular:  Antilipid - Statins Failed - 11/07/2020  9:37 AM      Failed - Total Cholesterol in normal range and within 360 days    Cholesterol, Total  Date Value Ref Range Status  07/31/2019 151 100 - 199 mg/dL Final          Failed - LDL in normal range and within 360 days    LDL Chol Calc (NIH)  Date Value Ref Range Status  07/31/2019 77 0 - 99 mg/dL Final          Failed - HDL in normal range and within 360 days    HDL  Date Value Ref Range Status  07/31/2019 27 (L) >39 mg/dL Final          Failed - Triglycerides in normal range and within 360 days    Triglycerides  Date Value Ref Range Status  07/31/2019 287 (H) 0 - 149 mg/dL Final          Passed - Patient is not pregnant      Passed - Valid encounter within last 12 months    Recent Outpatient Visits           7 months ago Type 2 diabetes mellitus with hyperglycemia, with long-term current use of insulin (HCC)   [facility] [place], [place], [place]   11 months ago Type 2 diabetes mellitus with hyperglycemia, with long-term current use of insulin (HCC)   [facility] [place], [place], MD   1 year ago Type 2 diabetes mellitus with hyperglycemia, with long-term current use of insulin (HCC)   [facility] [place], [place]ette Horns, MD   2 years ago Irritable bowel syndrome with diarrhea   [facility] [place], Odette Horns, MD   2 years ago Type 2 diabetes mellitus  with hyperglycemia, with long-term current use of insulin (HCC)   [facility] [place], MD

## 2020-11-22 ENCOUNTER — Other Ambulatory Visit: Payer: Self-pay | Admitting: Family Medicine

## 2020-11-22 DIAGNOSIS — H8113 Benign paroxysmal vertigo, bilateral: Secondary | ICD-10-CM

## 2020-11-22 DIAGNOSIS — Z8719 Personal history of other diseases of the digestive system: Secondary | ICD-10-CM

## 2020-11-22 DIAGNOSIS — I152 Hypertension secondary to endocrine disorders: Secondary | ICD-10-CM

## 2020-11-22 DIAGNOSIS — K580 Irritable bowel syndrome with diarrhea: Secondary | ICD-10-CM

## 2020-11-22 DIAGNOSIS — E1159 Type 2 diabetes mellitus with other circulatory complications: Secondary | ICD-10-CM

## 2020-11-22 DIAGNOSIS — E1165 Type 2 diabetes mellitus with hyperglycemia: Secondary | ICD-10-CM

## 2020-11-22 DIAGNOSIS — F419 Anxiety disorder, unspecified: Secondary | ICD-10-CM

## 2020-11-22 NOTE — Telephone Encounter (Signed)
Requested Prescriptions  Pending Prescriptions Disp Refills  . dicyclomine (BENTYL) 10 MG capsule [Pharmacy Med Name: dicyclomine 10 mg capsule] 270 capsule 1    Sig: Take 1 capsule (10 mg total) by mouth 3 (three) times daily before meals.     Gastroenterology:  Antispasmodic Agents Passed - 11/22/2020  9:32 AM      Passed - Last Heart Rate in normal range    Pulse Readings from Last 1 Encounters:  10/25/20 60         Passed - Valid encounter within last 12 months    Recent Outpatient Visits          7 months ago Type 2 diabetes mellitus with hyperglycemia, with long-term current use of insulin (HCC)   [facility] [place], Odette Horns, MD   1 year ago Type 2 diabetes mellitus with hyperglycemia, with long-term current use of insulin (HCC)   [facility] [place], Odette Horns, MD   1 year ago Type 2 diabetes mellitus with hyperglycemia, with long-term current use of insulin (HCC)   [facility] [place], Odette Horns, MD   2 years ago Irritable bowel syndrome with diarrhea   [facility] [place], Odette Horns, MD   2 years ago Type 2 diabetes mellitus with hyperglycemia, with long-term current use of insulin (HCC)   [facility] [place], [place], MD             . metoprolol succinate (TOPROL-XL) 25 MG 24 hr tablet [Pharmacy Med Name: metoprolol succinate ER 25 mg tablet,extended release 24 hr] 30 tablet 0    Sig: Take 1 tablet (25 mg total) by mouth daily.     Cardiovascular:  Beta Blockers Passed - 11/22/2020  9:32 AM      Passed - Last BP in normal range    BP Readings from Last 1 Encounters:  10/25/20 123/72         Passed - Last Heart Rate in normal range    Pulse Readings from Last 1 Encounters:  10/25/20 60         Passed - Valid encounter within last 6 months    Recent Outpatient Visits          7 months ago Type 2 diabetes mellitus with  hyperglycemia, with long-term current use of insulin (HCC)   [facility] [place] , [place], MD   1 year ago Type 2 diabetes mellitus with hyperglycemia, with long-term current use of insulin (HCC)   [facility] [place], [place], MD   1 year ago Type 2 diabetes mellitus with hyperglycemia, with long-term current use of insulin (HCC)   [facility] [place], [place], MD   2 years ago Irritable bowel syndrome with diarrhea   [facility] [place], Odette Horns, MD   2 years ago Type 2 diabetes mellitus with hyperglycemia, with long-term current use of insulin (HCC)    [facility] [place], [place], MD             . esomeprazole (NEXIUM) 20 MG capsule [Pharmacy Med Name: esomeprazole magnesium 20 mg capsule,delayed release] 90 capsule 1    Sig: Take 1 capsule (20 mg total) by mouth daily.     Gastroenterology: Proton Pump Inhibitors Passed - 11/22/2020  9:32 AM      Passed - Valid encounter within last 12 months  Recent Outpatient Visits          7 months ago Type 2 diabetes mellitus with hyperglycemia, with long-term current use of insulin (HCC)   [facility] [place], [place], MD   1 year ago Type 2 diabetes mellitus with hyperglycemia, with long-term current use of insulin (HCC)   [facility] [place], Odette Horns, MD   1 year ago Type 2 diabetes mellitus with hyperglycemia, with long-term current use of insulin (HCC)   [facility] [place], Odette Horns, MD   2 years ago Irritable bowel syndrome with diarrhea   [facility] [place], Odette Horns, MD   2 years ago Type 2 diabetes mellitus with hyperglycemia, with long-term current use of insulin (HCC)   [facility] [place], [place], MD             . atorvastatin (LIPITOR)  40 MG tablet [Pharmacy Med Name: atorvastatin 40 mg tablet] 90 tablet     Sig: Take 1 tablet (40 mg total) by mouth daily.     Cardiovascular:  Antilipid - Statins Failed - 11/22/2020  9:32 AM      Failed - Total Cholesterol in normal range and within 360 days    Cholesterol, Total  Date Value Ref Range Status  07/31/2019 151 100 - 199 mg/dL Final         Failed - LDL in normal range and within 360 days    LDL Chol Calc (NIH)  Date Value Ref Range Status  07/31/2019 77 0 - 99 mg/dL Final         Failed - HDL in normal range and within 360 days    HDL  Date Value Ref Range Status  07/31/2019 27 (L) >39 mg/dL Final         Failed - Triglycerides in normal range and within 360 days    Triglycerides  Date Value Ref Range Status  07/31/2019 287 (H) 0 - 149 mg/dL Final         Passed - Patient is not pregnant      Passed - Valid encounter within last 12 months    Recent Outpatient Visits          7 months ago Type 2 diabetes mellitus with hyperglycemia, with long-term current use of insulin (HCC)   [facility] [place], [place], MD   1 year ago Type 2 diabetes mellitus with hyperglycemia, with long-term current use of insulin (HCC)   [facility] [place], [place], MD   1 year ago Type 2 diabetes mellitus with hyperglycemia, with long-term current use of insulin (HCC)   [facility] [place], Odette Horns, MD   2 years ago Irritable bowel syndrome with diarrhea   [facility] [place], Odette Horns, MD   2 years ago Type 2 diabetes mellitus with hyperglycemia, with long-term current use of insulin (HCC)   [facility] Hoy Register, MD

## 2020-11-22 NOTE — Telephone Encounter (Signed)
Requested Prescriptions  Pending Prescriptions Disp Refills  . meclizine (ANTIVERT) 25 MG tablet [Pharmacy Med Name: meclizine 25 mg tablet] 90 tablet     Sig: Take 1 tablet (25 mg total) by mouth 3 (three) times daily as needed for dizziness.     Not Delegated - Gastroenterology: Antiemetics Failed - 11/22/2020 11:18 AM      Failed - This refill cannot be delegated      Passed - Valid encounter within last 6 months    Recent Outpatient Visits          7 months ago Type 2 diabetes mellitus with hyperglycemia, with long-term current use of insulin (HCC)   [facility] [place], [place], MD   1 year ago Type 2 diabetes mellitus with hyperglycemia, with long-term current use of insulin (HCC)   [facility] [place], [place], MD   1 year ago Type 2 diabetes mellitus with hyperglycemia, with long-term current use of insulin (HCC)   [facility] [place], [place], MD   2 years ago Irritable bowel syndrome with diarrhea   [facility] [place], Odette Horns, MD   2 years ago Type 2 diabetes mellitus with hyperglycemia, with long-term current use of insulin (HCC)   [facility] [place], [place]un[place], MD             . atorvastatin (LIPITOR) 40 MG tablet [Pharmacy Med Name: atorvastatin 40 mg tablet] 30 tablet     Sig: Take 1 tablet (40 mg total) by mouth daily.     Cardiovascular:  Antilipid - Statins Failed - 11/22/2020 11:18 AM      Failed - Total Cholesterol in normal range and within 360 days    Cholesterol, Total  Date Value Ref Range Status  07/31/2019 151 100 - 199 mg/dL Final         Failed - LDL in normal range and within 360 days    LDL Chol Calc (NIH)  Date Value Ref Range Status  07/31/2019 77 0 - 99 mg/dL Final         Failed - HDL in normal range and within 360 days    HDL  Date Value Ref Range Status  07/31/2019 27 (L) >39 mg/dL Final          Failed - Triglycerides in normal range and within 360 days    Triglycerides  Date Value Ref Range Status  07/31/2019 287 (H) 0 - 149 mg/dL Final         Passed - Patient is not pregnant      Passed - Valid encounter within last 12 months    Recent Outpatient Visits          7 months ago Type 2 diabetes mellitus with hyperglycemia, with long-term current use of insulin (HCC)   [facility] [place], [place], MD   1 year ago Type 2 diabetes mellitus with hyperglycemia, with long-term current use of insulin (HCC)   [facility] [place], [place], MD   1 year ago Type 2 diabetes mellitus with hyperglycemia, with long-term current use of insulin (HCC)   [facility] [place], [place], MD   2 years ago Irritable bowel syndrome with diarrhea   [place] [facility] [place], Odette Horns, MD   2 years ago Type 2 diabetes mellitus with hyperglycemia, with long-term current use of insulin (  HCC)   [facility] [place], [place], MD             . hydrOXYzine (ATARAX/VISTARIL) 10 MG tablet [Pharmacy Med Name: hydroxyzine HCl 10 mg tablet] 60 tablet 0    Sig: Take 1 tablet (10 mg total) by mouth 3 (three) times daily as needed.     Ear, Nose, and Throat:  Antihistamines Passed - 11/22/2020 11:18 AM      Passed - Valid encounter within last 12 months    Recent Outpatient Visits          7 months ago Type 2 diabetes mellitus with hyperglycemia, with long-term current use of insulin (HCC)   [facility] [place], [place], MD   1 year ago Type 2 diabetes mellitus with hyperglycemia, with long-term current use of insulin (HCC)   [facility] [place], [place], MD   1 year ago Type 2 diabetes mellitus with hyperglycemia, with long-term current use of insulin (HCC)   [facility] [place],  Odette Horns, MD   2 years ago Irritable bowel syndrome with diarrhea   [facility] [place], Odette Horns, MD   2 years ago Type 2 diabetes mellitus with hyperglycemia, with long-term current use of insulin [facility])   [facility] Hoy Register, MD

## 2020-11-22 NOTE — Telephone Encounter (Signed)
Requested medication (s) are due for refill today: yes  Requested medication (s) are on the active medication list: yes  Last refill:  meclizine: 09/04/20                          Atorvastatin: 09/08/20  Future visit scheduled: no  Notes to clinic:  called and LM on VM to call office to make appt- call back number provided   Requested Prescriptions  Pending Prescriptions Disp Refills   meclizine (ANTIVERT) 25 MG tablet [Pharmacy Med Name: meclizine 25 mg tablet] 90 tablet     Sig: Take 1 tablet (25 mg total) by mouth 3 (three) times daily as needed for dizziness.      Not Delegated - Gastroenterology: Antiemetics Failed - 11/22/2020 11:18 AM      Failed - This refill cannot be delegated      Passed - Valid encounter within last 6 months    Recent Outpatient Visits           7 months ago Type 2 diabetes mellitus with hyperglycemia, with long-term current use of insulin (HCC)   [facility] [place], [place], MD   1 year ago Type 2 diabetes mellitus with hyperglycemia, with long-term current use of insulin (HCC)   [facility] [place], [place], MD   1 year ago Type 2 diabetes mellitus with hyperglycemia, with long-term current use of insulin (HCC)   [facility] [place], [place], MD   2 years ago Irritable bowel syndrome with diarrhea   [facility] [place], Odette Horns, MD   2 years ago Type 2 diabetes mellitus with hyperglycemia, with long-term current use of insulin (HCC)   [facility] [place], [place], MD                  atorvastatin (LIPITOR) 40 MG tablet [Pharmacy Med Name: atorvastatin 40 mg tablet] 30 tablet     Sig: Take 1 tablet (40 mg total) by mouth daily.      Cardiovascular:  Antilipid - Statins Failed - 11/22/2020 11:18 AM      Failed - Total Cholesterol in normal range and within 360 days    Cholesterol, Total  Date Value  Ref Range Status  07/31/2019 151 100 - 199 mg/dL Final          Failed - LDL in normal range and within 360 days    LDL Chol Calc (NIH)  Date Value Ref Range Status  07/31/2019 77 0 - 99 mg/dL Final          Failed - HDL in normal range and within 360 days    HDL  Date Value Ref Range Status  07/31/2019 27 (L) >39 mg/dL Final          Failed - Triglycerides in normal range and within 360 days    Triglycerides  Date Value Ref Range Status  07/31/2019 287 (H) 0 - 149 mg/dL Final          Passed - Patient is not pregnant      Passed - Valid encounter within last 12 months    Recent Outpatient Visits           7 months ago Type 2 diabetes mellitus with hyperglycemia, with long-term current use of insulin (HCC)    [facility] Hoy Register, MD   1 year  ago Type 2 diabetes mellitus with hyperglycemia, with long-term current use of insulin (HCC)   [facility] [place], Candelaria Arenas, MD   1 year ago Type 2 diabetes mellitus with hyperglycemia, with long-term current use of insulin (HCC)   [facility] [place], Odette Horns, MD   2 years ago Irritable bowel syndrome with diarrhea   [facility] [place], Odette Horns, MD   2 years ago Type 2 diabetes mellitus with hyperglycemia, with long-term current use of insulin (HCC)   [facility] [place], Odette Horns, MD                 Signed Prescriptions Disp Refills   hydrOXYzine (ATARAX/VISTARIL) 10 MG tablet 60 tablet 0    Sig: Take 1 tablet (10 mg total) by mouth 3 (three) times daily as needed.      Ear, Nose, and Throat:  Antihistamines Passed - 11/22/2020 11:18 AM      Passed - Valid encounter within last 12 months    Recent Outpatient Visits           7 months ago Type 2 diabetes mellitus with hyperglycemia, with long-term current use of insulin (HCC)   [facility] [place], [place], MD   1 year ago Type 2 diabetes mellitus with hyperglycemia, with long-term current use of insulin (HCC)   [facility] [place], [place], MD   1 year ago Type 2 diabetes mellitus with hyperglycemia, with long-term current use of insulin (HCC)   [facility] [place], Odette Horns, MD   2 years ago Irritable bowel syndrome with diarrhea   [facility] [place], Odette Horns, MD   2 years ago Type 2 diabetes mellitus with hyperglycemia, with long-term current use of insulin [facility])   [facility] Hoy Register, MD

## 2020-11-22 NOTE — Telephone Encounter (Signed)
Requested medication (s) are due for refill today: yes  Requested medication (s) are on the active medication list: yes  Last refill:  09/08/20   Future visit scheduled: no  Notes to clinic:  called pt and LM on VM to call office to schedule appt.   Requested Prescriptions  Pending Prescriptions Disp Refills   atorvastatin (LIPITOR) 40 MG tablet [Pharmacy Med Name: atorvastatin 40 mg tablet] 90 tablet     Sig: Take 1 tablet (40 mg total) by mouth daily.      Cardiovascular:  Antilipid - Statins Failed - 11/22/2020  9:32 AM      Failed - Total Cholesterol in normal range and within 360 days    Cholesterol, Total  Date Value Ref Range Status  07/31/2019 151 100 - 199 mg/dL Final          Failed - LDL in normal range and within 360 days    LDL Chol Calc (NIH)  Date Value Ref Range Status  07/31/2019 77 0 - 99 mg/dL Final          Failed - HDL in normal range and within 360 days    HDL  Date Value Ref Range Status  07/31/2019 27 (L) >39 mg/dL Final          Failed - Triglycerides in normal range and within 360 days    Triglycerides  Date Value Ref Range Status  07/31/2019 287 (H) 0 - 149 mg/dL Final          Passed - Patient is not pregnant      Passed - Valid encounter within last 12 months    Recent Outpatient Visits           7 months ago Type 2 diabetes mellitus with hyperglycemia, with long-term current use of insulin (HCC)   [facility] [place], [place]ta, MD   1 year ago Type 2 diabetes mellitus with hyperglycemia, with long-term current use of insulin (HCC)   [facility] [place], [place], MD   1 year ago Type 2 diabetes mellitus with hyperglycemia, with long-term current use of insulin (HCC)   [facility] [place], Anthony, MD   2 years ago Irritable bowel syndrome with diarrhea   [facility] [place], Odette Horns, MD   2 years ago Type 2 diabetes  mellitus with hyperglycemia, with long-term current use of insulin (HCC)   [facility] [place], Odette Horns, MD                 Signed Prescriptions Disp Refills   dicyclomine (BENTYL) 10 MG capsule 270 capsule 1    Sig: Take 1 capsule (10 mg total) by mouth 3 (three) times daily before meals.      Gastroenterology:  Antispasmodic Agents Passed - 11/22/2020  9:32 AM      Passed - Last Heart Rate in normal range    Pulse Readings from Last 1 Encounters:  10/25/20 60          Passed - Valid encounter within last 12 months    Recent Outpatient Visits           7 months ago Type 2 diabetes mellitus with hyperglycemia, with long-term current use of insulin (HCC)   [facility] [place], Odette Horns, MD   1 year ago Type 2 diabetes mellitus with hyperglycemia, with long-term current use of insulin (HCC)  [facility] [place], [place], MD   1 year ago Type 2 diabetes mellitus with hyperglycemia, with long-term current use of insulin (HCC)   [facility] [place], Bar[place], MD   2 years ago Irritable bowel syndrome with diarrhea   [facility] [place], Odette Horns, MD   2 years ago Type 2 diabetes mellitus with hyperglycemia, with long-term current use of insulin (HCC)   [facility] [place], Odette Horns, MD                  metoprolol succinate (TOPROL-XL) 25 MG 24 hr tablet 30 tablet 0    Sig: Take 1 tablet (25 mg total) by mouth daily.      Cardiovascular:  Beta Blockers Passed - 11/22/2020  9:32 AM      Passed - Last BP in normal range    BP Readings from Last 1 Encounters:  10/25/20 123/72          Passed - Last Heart Rate in normal range    Pulse Readings from Last 1 Encounters:  10/25/20 60          Passed - Valid encounter within last 6 months    Recent Outpatient Visits           7 months ago Type 2  diabetes mellitus with hyperglycemia, with long-term current use of insulin (HCC)   [facility] [place], [place], MD   1 year ago Type 2 diabetes mellitus with hyperglycemia, with long-term current use of insulin (HCC)   [facility] [place], [place], MD   1 year ago Type 2 diabetes mellitus with hyperglycemia, with long-term current use of insulin (HCC)   [facility] [place], [place], MD   2 years ago Irritable bowel syndrome with diarrhea   [facility] [place], Odette Horns, MD   2 years ago Type 2 diabetes mellitus with hyperglycemia, with long-term current use of insulin (HCC)   [facility] [place], Odette Horns, MD                  esomeprazole (NEXIUM) 20 MG capsule 90 capsule 1    Sig: Take 1 capsule (20 mg total) by mouth daily.      Gastroenterology: Proton Pump Inhibitors Passed - 11/22/2020  9:32 AM      Passed - Valid encounter within last 12 months    Recent Outpatient Visits           7 months ago Type 2 diabetes mellitus with hyperglycemia, with long-term current use of insulin (HCC)   [facility] [employer], [place], MD   1 year ago Type 2 diabetes mellitus with hyperglycemia, with long-term current use of insulin (HCC)   [facility] [place], [place], MD   1 year ago Type 2 diabetes mellitus with hyperglycemia, with long-term current use of insulin (HCC)   [facility] [place], Odette Horns, MD   2 years ago Irritable bowel syndrome with diarrhea    [facility] [place], Odette Horns, MD   2 years ago Type 2 diabetes mellitus with hyperglycemia, with long-term current use of insulin [facility])    [facility] Hoy Register, MD

## 2020-11-25 ENCOUNTER — Other Ambulatory Visit: Payer: Self-pay | Admitting: Family Medicine

## 2020-11-25 DIAGNOSIS — K580 Irritable bowel syndrome with diarrhea: Secondary | ICD-10-CM

## 2020-11-27 ENCOUNTER — Other Ambulatory Visit: Payer: Self-pay | Admitting: Family Medicine

## 2020-11-27 DIAGNOSIS — Z794 Long term (current) use of insulin: Secondary | ICD-10-CM

## 2020-11-27 DIAGNOSIS — E1165 Type 2 diabetes mellitus with hyperglycemia: Secondary | ICD-10-CM

## 2020-11-27 MED ORDER — GLIMEPIRIDE 4 MG PO TABS: 8.0000 mg | ORAL_TABLET | Freq: Every day | ORAL | 1 refills | Status: DC

## 2020-11-27 NOTE — Telephone Encounter (Signed)
Copied from CRM [id number]. Topic: Quick Communication - Rx Refill/Question >> Nov 27, 2020 10:16 AM Jaquita Rector A wrote: Medication: glimepiride (AMARYL) 4 MG tablet  Has the patient contacted their pharmacy? Yes.   (Agent: If no, request that the patient contact the pharmacy for the refill.) (Agent: If yes, when and what did the pharmacy advise?)  Preferred Pharmacy (with phone number or street name): [facility] - [place], [place] - [address]  Phone:  [phone] Fax:  [phone]     Agent: Please be advised that RX refills may take up to 3 business days. We ask that you follow-up with your pharmacy.

## 2020-11-27 NOTE — Telephone Encounter (Signed)
   Notes to clinic:  Patient is scheduled for a follow up on 01/28/2021 Review for enough medication until appt    Requested Prescriptions  Pending Prescriptions Disp Refills   glimepiride (AMARYL) 4 MG tablet 30 tablet 0    Sig: Take 2 tablets (8 mg total) by mouth daily with breakfast.      Endocrinology:  Diabetes - Sulfonylureas Failed - 11/27/2020 10:21 AM      Failed - HBA1C is between 0 and 7.9 and within 180 days    Hemoglobin A1C  Date Value Ref Range Status  07/15/2020 13.4 (A) 4.0 - 5.6 % Final   HbA1c, POC (controlled diabetic range)  Date Value Ref Range Status  04/02/2020 11.6 (A) 0.0 - 7.0 % Final          Passed - Valid encounter within last 6 months    Recent Outpatient Visits           7 months ago Type 2 diabetes mellitus with hyperglycemia, with long-term current use of insulin (HCC)   [facility] [place], [place], MD   1 year ago Type 2 diabetes mellitus with hyperglycemia, with long-term current use of insulin (HCC)   [facility] [place], [place], MD   1 year ago Type 2 diabetes mellitus with hyperglycemia, with long-term current use of insulin (HCC)   [facility] [place], Odette Horns, MD   2 years ago Irritable bowel syndrome with diarrhea   [facility] [place], Odette Horns, MD   2 years ago Type 2 diabetes mellitus with hyperglycemia, with long-term current use of insulin (HCC)    [facility] Hoy Register, MD       Future Appointments             In 2 months Hoy Register, MD [facility]

## 2020-12-06 ENCOUNTER — Other Ambulatory Visit: Payer: Self-pay | Admitting: Family Medicine

## 2020-12-06 DIAGNOSIS — K580 Irritable bowel syndrome with diarrhea: Secondary | ICD-10-CM

## 2020-12-06 DIAGNOSIS — Z794 Long term (current) use of insulin: Secondary | ICD-10-CM

## 2020-12-06 NOTE — Telephone Encounter (Signed)
Requested medication (s) are due for refill today: yes  Requested medication (s) are on the active medication list: yes  Last refill:  09/08/20 #30  Future visit scheduled: yes  Notes to clinic:  overdue lab work   Requested Prescriptions  Pending Prescriptions Disp Refills   atorvastatin (LIPITOR) 40 MG tablet [Pharmacy Med Name: atorvastatin 40 mg tablet] 90 tablet     Sig: Take 1 tablet (40 mg total) by mouth daily.      Cardiovascular:  Antilipid - Statins Failed - 12/06/2020  9:50 AM      Failed - Total Cholesterol in normal range and within 360 days    Cholesterol, Total  Date Value Ref Range Status  07/31/2019 151 100 - 199 mg/dL Final          Failed - LDL in normal range and within 360 days    LDL Chol Calc (NIH)  Date Value Ref Range Status  07/31/2019 77 0 - 99 mg/dL Final          Failed - HDL in normal range and within 360 days    HDL  Date Value Ref Range Status  07/31/2019 27 (L) >39 mg/dL Final          Failed - Triglycerides in normal range and within 360 days    Triglycerides  Date Value Ref Range Status  07/31/2019 287 (H) 0 - 149 mg/dL Final          Passed - Patient is not pregnant      Passed - Valid encounter within last 12 months    Recent Outpatient Visits           8 months ago Type 2 diabetes mellitus with hyperglycemia, with long-term current use of insulin (HCC)   [facility] [place], [place], MD   1 year ago Type 2 diabetes mellitus with hyperglycemia, with long-term current use of insulin (HCC)   [facility] [place], [place], MD   1 year ago Type 2 diabetes mellitus with hyperglycemia, with long-term current use of insulin (HCC)   [facility] [place], [place], MD   2 years ago Irritable bowel syndrome with diarrhea   [facility] [place], Leonard, MD   2 years ago Type 2 diabetes mellitus with hyperglycemia, with  long-term current use of insulin (HCC)   [facility] [place], Odette Horns, MD       Future Appointments             In 1 month Hoy Register, MD [facility]              Refused Prescriptions Disp Refills   dicyclomine (BENTYL) 10 MG capsule [Pharmacy Med Name: dicyclomine 10 mg capsule] 270 capsule     Sig: Take 1 capsule (10 mg total) by mouth 3 (three) times daily before meals.      Gastroenterology:  Antispasmodic Agents Passed - 12/06/2020  9:50 AM      Passed - Last Heart Rate in normal range    Pulse Readings from Last 1 Encounters:  10/25/20 60          Passed - Valid encounter within last 12 months    Recent Outpatient Visits           8 months ago Type 2 diabetes mellitus with hyperglycemia, with long-term current use of insulin (HCC)   [facility] [place], [place], MD   1 year ago Type 2 diabetes mellitus with hyperglycemia, with long-term current use of insulin (HCC)   [facility] [place], [address], MD   1 year ago Type 2 diabetes mellitus with hyperglycemia, with long-term current use of insulin (HCC)   [facility] [place], Odette Horns, MD   2 years ago Irritable bowel syndrome with diarrhea   [place] [facility] [place], Odette Horns, MD   2 years ago Type 2 diabetes mellitus with hyperglycemia, with long-term current use of insulin (HCC)   [facility] Hoy Register, MD       Future Appointments             In 1 month Hoy Register, MD [facility]

## 2020-12-06 NOTE — Telephone Encounter (Signed)
Requested Prescriptions  Pending Prescriptions Disp Refills  . dicyclomine (BENTYL) 10 MG capsule [Pharmacy Med Name: dicyclomine 10 mg capsule] 270 capsule     Sig: Take 1 capsule (10 mg total) by mouth 3 (three) times daily before meals.     Gastroenterology:  Antispasmodic Agents Passed - 12/06/2020  9:50 AM      Passed - Last Heart Rate in normal range    Pulse Readings from Last 1 Encounters:  10/25/20 60         Passed - Valid encounter within last 12 months    Recent Outpatient Visits          8 months ago Type 2 diabetes mellitus with hyperglycemia, with long-term current use of insulin (HCC)   [facility] [place], [place], MD   1 year ago Type 2 diabetes mellitus with hyperglycemia, with long-term current use of insulin (HCC)   [facility] [place], [place], MD   1 year ago Type 2 diabetes mellitus with hyperglycemia, with long-term current use of insulin (HCC)   [facility] [place], Odette Horns, MD   2 years ago Irritable bowel syndrome with diarrhea   [place] [facility] [place], Odette Horns, MD   2 years ago Type 2 diabetes mellitus with hyperglycemia, with long-term current use of insulin (HCC)   [place] [facility] Hoy Register, MD      Future Appointments            In 1 month Hoy Register, MD [facility]           . atorvastatin (LIPITOR) 40 MG tablet [Pharmacy Med Name: atorvastatin 40 mg tablet] 90 tablet     Sig: Take 1 tablet (40 mg total) by mouth daily.     Cardiovascular:  Antilipid - Statins Failed - 12/06/2020  9:50 AM      Failed - Total Cholesterol in normal range and within 360 days    Cholesterol, Total  Date Value Ref Range Status  07/31/2019 151 100 - 199 mg/dL Final         Failed - LDL in normal range and within 360 days    LDL Chol Calc (NIH)  Date Value Ref Range Status  07/31/2019  77 0 - 99 mg/dL Final         Failed - HDL in normal range and within 360 days    HDL  Date Value Ref Range Status  07/31/2019 27 (L) >39 mg/dL Final         Failed - Triglycerides in normal range and within 360 days    Triglycerides  Date Value Ref Range Status  07/31/2019 287 (H) 0 - 149 mg/dL Final         Passed - Patient is not pregnant      Passed - Valid encounter within last 12 months    Recent Outpatient Visits          8 months ago Type 2 diabetes mellitus with hyperglycemia, with long-term current use of insulin (HCC)   [place] [place], [place], MD   1 year ago Type 2 diabetes mellitus with hyperglycemia, with long-term current use of insulin (HCC)   [place] [facility] [place], [place], MD   1 year ago Type 2 diabetes mellitus with hyperglycemia, with long-term current use of insulin (HCC)   [facility] Hoy Register, MD   2 years ago Irritable bowel syndrome with diarrhea   [place] [facility] [place], Herman, MD   2 years ago Type 2 diabetes mellitus with hyperglycemia, with long-term current use of insulin [facility])   [place] Hoy Register, MD      Future Appointments            In 1 month Hoy Register, MD [facility]

## 2020-12-08 ENCOUNTER — Other Ambulatory Visit: Payer: Self-pay | Admitting: Family Medicine

## 2020-12-08 DIAGNOSIS — Z794 Long term (current) use of insulin: Secondary | ICD-10-CM

## 2020-12-08 NOTE — Telephone Encounter (Signed)
Requested medication (s) are due for refill today: yes  Requested medication (s) are on the active medication list: yes  Last refill:  09/08/20  Future visit scheduled: yes 01/28/21  Notes to clinic:  overdue lab work- pt has upcoming appt   Requested Prescriptions  Pending Prescriptions Disp Refills   atorvastatin (LIPITOR) 40 MG tablet [Pharmacy Med Name: atorvastatin 40 mg tablet] 30 tablet 0    Sig: Take 1 tablet (40 mg total) by mouth daily.      Cardiovascular:  Antilipid - Statins Failed - 12/08/2020  9:11 AM      Failed - Total Cholesterol in normal range and within 360 days    Cholesterol, Total  Date Value Ref Range Status  07/31/2019 151 100 - 199 mg/dL Final          Failed - LDL in normal range and within 360 days    LDL Chol Calc (NIH)  Date Value Ref Range Status  07/31/2019 77 0 - 99 mg/dL Final          Failed - HDL in normal range and within 360 days    HDL  Date Value Ref Range Status  07/31/2019 27 (L) >39 mg/dL Final          Failed - Triglycerides in normal range and within 360 days    Triglycerides  Date Value Ref Range Status  07/31/2019 287 (H) 0 - 149 mg/dL Final          Passed - Patient is not pregnant      Passed - Valid encounter within last 12 months    Recent Outpatient Visits           8 months ago Type 2 diabetes mellitus with hyperglycemia, with long-term current use of insulin (HCC)   [facility] [place], [place], MD   1 year ago Type 2 diabetes mellitus with hyperglycemia, with long-term current use of insulin (HCC)   [facility] [place], [place], MD   1 year ago Type 2 diabetes mellitus with hyperglycemia, with long-term current use of insulin (HCC)   [place] [place], Odette Horns, MD   2 years ago Irritable bowel syndrome with diarrhea   [place] [place], Odette Horns, MD   2 years ago Type 2 diabetes mellitus  with hyperglycemia, with long-term current use of insulin (HCC)   [place] [facility] Hoy Register, MD       Future Appointments             In 1 month Hoy Register, MD [facility]

## 2020-12-10 ENCOUNTER — Other Ambulatory Visit: Payer: Self-pay | Admitting: Family Medicine

## 2020-12-10 DIAGNOSIS — Z794 Long term (current) use of insulin: Secondary | ICD-10-CM

## 2020-12-17 ENCOUNTER — Other Ambulatory Visit: Payer: Self-pay | Admitting: Family Medicine

## 2020-12-17 DIAGNOSIS — K580 Irritable bowel syndrome with diarrhea: Secondary | ICD-10-CM

## 2020-12-17 NOTE — Telephone Encounter (Signed)
Requested medication (s) are due for refill : yes   Requested medication (s) are on the active medication list:yes   Last refill:  09/23/20 #30 1 refill  Future visit scheduled: yes in 1 month  Notes to clinic:  not delegated per protocol     Requested Prescriptions  Pending Prescriptions Disp Refills   diphenoxylate-atropine (LOMOTIL) 2.5-0.025 MG tablet [Pharmacy Med Name: diphenoxylate-atropine 2.5 mg-0.025 mg tablet] 30 tablet 1    Sig: Take 1 tablet by mouth 4 (four) times daily as needed for diarrhea or loose stools.      Not Delegated - Gastroenterology:  Antidiarrheals Failed - 12/17/2020 11:50 AM      Failed - This refill cannot be delegated      Passed - Valid encounter within last 12 months    Recent Outpatient Visits           8 months ago Type 2 diabetes mellitus with hyperglycemia, with long-term current use of insulin (HCC)   [facility] [place], [place], MD   1 year ago Type 2 diabetes mellitus with hyperglycemia, with long-term current use of insulin (HCC)   [place] [place], [place], MD   1 year ago Type 2 diabetes mellitus with hyperglycemia, with long-term current use of insulin (HCC)   [place] [facility] [facility] [place], Odette Horns, MD   2 years ago Irritable bowel syndrome with diarrhea   [facility] [place], Odette Horns, MD   2 years ago Type 2 diabetes mellitus with hyperglycemia, with long-term current use of insulin (HCC)    [facility] Hoy Register, MD       Future Appointments             In 1 month Hoy Register, MD [facility]

## 2020-12-19 ENCOUNTER — Other Ambulatory Visit: Payer: Self-pay | Admitting: Family Medicine

## 2020-12-19 ENCOUNTER — Other Ambulatory Visit: Payer: Self-pay

## 2020-12-19 ENCOUNTER — Ambulatory Visit (INDEPENDENT_AMBULATORY_CARE_PROVIDER_SITE_OTHER): Payer: Medicaid Other | Admitting: Internal Medicine

## 2020-12-19 ENCOUNTER — Encounter: Payer: Self-pay | Admitting: Internal Medicine

## 2020-12-19 VITALS — BP 130/80 | HR 76 | Ht 67.0 in | Wt 152.4 lb

## 2020-12-19 DIAGNOSIS — B373 Candidiasis of vulva and vagina: Secondary | ICD-10-CM | POA: Diagnosis not present

## 2020-12-19 DIAGNOSIS — E1142 Type 2 diabetes mellitus with diabetic polyneuropathy: Secondary | ICD-10-CM

## 2020-12-19 DIAGNOSIS — B3731 Acute candidiasis of vulva and vagina: Secondary | ICD-10-CM

## 2020-12-19 DIAGNOSIS — Z794 Long term (current) use of insulin: Secondary | ICD-10-CM | POA: Diagnosis not present

## 2020-12-19 DIAGNOSIS — I152 Hypertension secondary to endocrine disorders: Secondary | ICD-10-CM

## 2020-12-19 DIAGNOSIS — E1165 Type 2 diabetes mellitus with hyperglycemia: Secondary | ICD-10-CM

## 2020-12-19 DIAGNOSIS — E1159 Type 2 diabetes mellitus with other circulatory complications: Secondary | ICD-10-CM

## 2020-12-19 LAB — POCT GLYCOSYLATED HEMOGLOBIN (HGB A1C): Hemoglobin A1C: 11.9 % — AB (ref 4.0–5.6)

## 2020-12-19 MED ORDER — TRULICITY 0.75 MG/0.5ML ~~LOC~~ SOAJ: 0.7500 mg | SUBCUTANEOUS | 4 refills | Status: DC

## 2020-12-19 MED ORDER — INSULIN PEN NEEDLE 32G X 4 MM MISC: 1.0000 | Freq: Every day | 2 refills | Status: DC

## 2020-12-19 MED ORDER — FLUCONAZOLE 150 MG PO TABS: 150.0000 mg | ORAL_TABLET | Freq: Once | ORAL | 0 refills | Status: AC

## 2020-12-19 MED ORDER — ONETOUCH VERIO VI STRP: 1.0000 | ORAL_STRIP | Freq: Every day | 6 refills | Status: DC

## 2020-12-19 MED ORDER — LANTUS SOLOSTAR 100 UNIT/ML ~~LOC~~ SOPN: 18.0000 [IU] | PEN_INJECTOR | Freq: Every day | SUBCUTANEOUS | 6 refills | Status: DC

## 2020-12-19 NOTE — Progress Notes (Signed)
Name: [name]  MRN/ DOB: [id number], 1961-05-21   Age/ Sex: 60 y.o., female    PCP: Charlott Rakes, MD   Reason for Endocrinology Evaluation: Type 2 Diabetes Mellitus     Date of Initial Endocrinology Visit: 12/19/2020     PATIENT IDENTIFIER: [name] is a 60 y.o. female with a past medical history of T2DM, HTN, COPD  and CHF. The patient presented for initial endocrinology clinic visit on 12/19/2020 for consultative assistance with her diabetes management.    HPI: Ms. Ericsson was    Diagnosed with DM 2017 Prior Medications tried/Intolerance: Metformin - diarrhea.  Currently checking blood sugars 0 x / day,   Hypoglycemia episodes : Unknown                 Hemoglobin A1c has ranged from 9.0% in 2018, peaking at 13.4% in 2022. Patient required assistance for hypoglycemia: no  Patient has required hospitalization within the last 1 year from hyper or hypoglycemia:   In terms of diet, the patient does not eat actual meal, snacks occasionally . Drinks sweet tea and diet Dr. Malachi Bonds   Had COVID twice 05/2020 and 07/2020. She has stopped insulin due to hypoglycemia due to being sick at the time COVID in 05/2020  She has concurrent vaginal yeast infection with irritation     HOME DIABETES REGIMEN: Novolog Mix  Glimepiride 4 mg two tablets daily   Statin: yes ACE-I/ARB: Allergic to lisinopril - swelling  Prior Diabetic Education: no   METER DOWNLOAD SUMMARY: Did not bring    DIABETIC COMPLICATIONS: Microvascular complications:  Neuropathy Denies: CKD,retinopathy  Last eye exam: pending 7-/2022  Macrovascular complications:  CHF  Denies: CAD, PVD, CVA   PAST HISTORY: Past Medical History:  Past Medical History:  Diagnosis Date   Anxiety    Arthritis    Chronic systolic heart failure (HCC)    EF 20-25% ECHO 05/2015   COPD (chronic obstructive pulmonary disease) ([place])    Depression    Diabetes mellitus without complication ([place])    Type  II   Dysrhythmia    pt unsure of name of arrythmia - " my heart rate will drop all of a sudden" -no current treatment - atrial flutter   Fibrillation, atrial (HCC)    Gallstones    GERD (gastroesophageal reflux disease)    Headache(784.0)    migraines   Hypertension    Osteomyelitis (HCC)    R ankle   Rash    arms   Past Surgical History:  Past Surgical History:  Procedure Laterality Date   ANKLE FRACTURE SURGERY Right 2015   CARDIAC CATHETERIZATION N/A 06/24/2015   Procedure: Left Heart Cath and Coronary Angiography;  Surgeon: Belva Crome, MD;  Location: [facility];  Service: Cardiovascular;  Laterality: N/A;   CARPAL TUNNEL RELEASE     bil   CHOLECYSTECTOMY  11/24/2011   Procedure: LAPAROSCOPIC CHOLECYSTECTOMY WITH INTRAOPERATIVE CHOLANGIOGRAM;  Surgeon: Joyice Faster. Cornett, MD;  Location: [facility];  Service: General;  Laterality: N/A;  Laparoscopic Cholecystectomy with Cholangiogram   COLONOSCOPY     ECTOPIC PREGNANCY SURGERY  many yrs ago   HARDWARE REMOVAL Right 07/15/2015   Procedure: RIGHT ANKLE HARDWARE REMOVAL, PLACEMENT OF STIMULAN ANTIBIOTIC BEADS AND PREVENA WOUND VAC.;  Surgeon: Meredith Pel, MD;  Location: [facility];  Service: Orthopedics;  Laterality: Right;   TOOTH EXTRACTION N/A 12/03/2016   Procedure: DENTAL EXTRACTIONS with Alveoloplasty;  Surgeon: Diona Browner, DDS;  Location: [facility];  Service: Oral Surgery;  Laterality: N/A;    Social History:  reports that she has been smoking cigarettes and e-cigarettes. She started smoking about 4 years ago. She has never used smokeless tobacco. She reports that she does not drink alcohol and does not use drugs. Family History:  Family History  Problem Relation Age of Onset   Diabetes Mother    Lung cancer Father    Heart attack Father 73       CABG x4   Breast cancer Sister        Survivor      HOME MEDICATIONS: Allergies as of 12/19/2020       Reactions   Biaxin [clarithromycin] Anaphylaxis, Swelling    Clarithromycin Shortness Of Breath, Swelling   Lisinopril Swelling, Cough   Lip edema   Sulfa Antibiotics Anaphylaxis, Shortness Of Breath, Swelling, Hypertension   Chlorthalidone Nausea And Vomiting, Other (See Comments)   Clammy, Tachycardia, Headache   Daptomycin Nausea And Vomiting   Latex Hives, Rash   Amoxicillin-pot Clavulanate Diarrhea   Has patient had a PCN reaction causing immediate rash, facial/tongue/throat swelling, SOB or lightheadedness with hypotension:No Has patient had a PCN reaction causing severe rash involving mucus membranes or skin necrosis:No Has patient had a PCN reaction that required hospitalization:No Has patient had a PCN reaction occurring within THE LAST 10 YEARS.  #  #  #  YES  #  #  #  If all of the above answers are "NO", then may proceed with Cephalosporin use.   Aspirin Nausea And Vomiting, Rash   On $R'325mg'ck$  dosage   Cholestyramine Nausea And Vomiting   Dilaudid [hydromorphone Hcl] Nausea And Vomiting   Lasix [furosemide] Nausea And Vomiting, Other (See Comments)   Headache         Medication List        Accurate as of December 19, 2020 11:10 AM. If you have any questions, ask your nurse or doctor.          Accu-Chek Aviva Plus test strip Generic drug: glucose blood USE ONE TEST SSTRIP FOR BLOOD SUGAR FOUR TIMES DAILY   Accu-Chek Guide w/Device Kit 1 each by Does not apply route 4 (four) times daily.   accu-chek softclix lancets Use as instructed daily.   albuterol 108 (90 Base) MCG/ACT inhaler Commonly known as: VENTOLIN HFA Inhale 2 puffs into the lungs every 4 (four) hours as needed for wheezing or shortness of breath.   atorvastatin 40 MG tablet Commonly known as: LIPITOR Take 1 tablet (40 mg total) by mouth daily.   benzonatate 200 MG capsule Commonly known as: TESSALON Take 1 capsule (200 mg total) by mouth every 8 (eight) hours.   dicyclomine 10 MG capsule Commonly known as: BENTYL Take 1 capsule (10 mg total) by mouth 3  (three) times daily before meals.   diphenoxylate-atropine 2.5-0.025 MG tablet Commonly known as: LOMOTIL Take 1 tablet by mouth 4 (four) times daily as needed for diarrhea or loose stools.   doxycycline 100 MG tablet Commonly known as: VIBRA-TABS Take 1 tablet (100 mg total) by mouth 2 (two) times daily.   esomeprazole 20 MG capsule Commonly known as: NEXIUM Take 1 capsule (20 mg total) by mouth daily.   FLUoxetine 40 MG capsule Commonly known as: PROZAC Take 1 capsule (40 mg total) by mouth at bedtime.   gabapentin 300 MG capsule Commonly known as: NEURONTIN Take 1 capsule (300 mg total) by mouth 2 (two) times daily.   glimepiride 4 MG tablet  Commonly known as: AMARYL Take 2 tablets (8 mg total) by mouth daily with breakfast.   hydrOXYzine 10 MG tablet Commonly known as: ATARAX/VISTARIL Take 1 tablet (10 mg total) by mouth 3 (three) times daily as needed.   meclizine 25 MG tablet Commonly known as: ANTIVERT Take 1 tablet (25 mg total) by mouth 3 (three) times daily as needed for dizziness.   metoprolol succinate 25 MG 24 hr tablet Commonly known as: TOPROL-XL Take 1 tablet (25 mg total) by mouth daily.   NovoLOG Mix 70/30 FlexPen (70-30) 100 UNIT/ML FlexPen Generic drug: insulin aspart protamine - aspart Inject subcutaneously 45 units in the morning and 35 units in the evening   tiotropium 18 MCG inhalation capsule Commonly known as: SPIRIVA Place 1 capsule (18 mcg total) into inhaler and inhale daily.   Unifine Pentips Plus 32G X 4 MM Misc Generic drug: Insulin Pen Needle USE TO administer lantus AND humlog TWICE DAILY         ALLERGIES: Allergies  Allergen Reactions   Biaxin [Clarithromycin] Anaphylaxis and Swelling   Clarithromycin Shortness Of Breath and Swelling   Lisinopril Swelling and Cough    Lip edema   Sulfa Antibiotics Anaphylaxis, Shortness Of Breath, Swelling and Hypertension   Chlorthalidone Nausea And Vomiting and Other (See Comments)     Clammy, Tachycardia, Headache    Daptomycin Nausea And Vomiting   Latex Hives and Rash   Amoxicillin-Pot Clavulanate Diarrhea    Has patient had a PCN reaction causing immediate rash, facial/tongue/throat swelling, SOB or lightheadedness with hypotension:No Has patient had a PCN reaction causing severe rash involving mucus membranes or skin necrosis:No Has patient had a PCN reaction that required hospitalization:No Has patient had a PCN reaction occurring within THE LAST 10 YEARS.  #  #  #  YES  #  #  #  If all of the above answers are "NO", then may proceed with Cephalosporin use.    Aspirin Nausea And Vomiting and Rash    On $R'325mg'xP$  dosage   Cholestyramine Nausea And Vomiting   Dilaudid [Hydromorphone Hcl] Nausea And Vomiting   Lasix [Furosemide] Nausea And Vomiting and Other (See Comments)    Headache      REVIEW OF SYSTEMS: A comprehensive ROS was conducted with the patient and is negative except as per HPI and below:  Review of Systems  Gastrointestinal:  Positive for nausea and vomiting.  Neurological:  Positive for tingling.  Endo/Heme/Allergies:  Positive for polydipsia.     OBJECTIVE:   VITAL SIGNS: BP 130/80 (BP Location: Right Arm, Patient Position: Sitting, Cuff Size: Normal)   Pulse 76   Ht $R'5\' 7"'sv$  (1.702 m)   Wt 152 lb 6.4 oz (69.1 kg)   LMP 07/24/2011   SpO2 97%   BMI 23.87 kg/m    PHYSICAL EXAM:  General: Pt appears well and is in NAD  Neck: General: Supple without adenopathy or carotid bruits. Thyroid: Thyroid size normal.  No goiter or nodules appreciated.   Lungs: Clear with good BS bilat with no rales, rhonchi, or wheezes  Heart: RRR  Abdomen: Normoactive bowel sounds, soft, nontender, without masses or organomegaly palpable  Extremities:  Lower extremities - No pretibial edema. No lesions.  Skin: Normal texture and temperature to palpation. No rash noted.  Neuro: MS is good with appropriate affect, pt is alert and Ox3     DATA REVIEWED:  Lab  Results  Component Value Date   HGBA1C 11.9 (A) 12/19/2020   HGBA1C 13.4 (A)  07/15/2020   HGBA1C 11.6 (A) 04/02/2020   Lab Results  Component Value Date   LDLCALC 77 07/31/2019   CREATININE 0.64 10/25/2020   Lab Results  Component Value Date   MICRALBCREAT 13 07/31/2019    Lab Results  Component Value Date   CHOL 151 07/31/2019   HDL 27 (L) 07/31/2019   LDLCALC 77 07/31/2019   TRIG 287 (H) 07/31/2019   CHOLHDL 5.6 (H) 07/31/2019        ASSESSMENT / PLAN / RECOMMENDATIONS:   1) Type 2 Diabetes Mellitus, Poorly controlled, With neuropathic  complications - Most recent A1c of 11.9 %. Goal A1c < 7.0 %.    Plan: GENERAL: I have discussed with the patient the pathophysiology of diabetes. We went over the natural progression of the disease. We talked about both insulin resistance and insulin deficiency. We stressed the importance of lifestyle changes including diet and exercise. I explained the complications associated with diabetes including retinopathy, nephropathy, neuropathy as well as increased risk of cardiovascular disease. We went over the benefit seen with glycemic control.   I explained to the patient that diabetic patients are at higher than normal risk for amputations.  She has not been on insulin in > 6 months, will switch insulin mix to basal insulin and stop Glimepiride and start GLP-1 agnonist, cautioned again GI side effects of GLP-1 agonist, no prior hx of pancreatitis   MEDICATIONS: STOP Glimepiride  STOP Novolog Mix  Start Lantus ( long acting insulin ) 18 units once daily  Start Trulicity 5.18 mg ONCE WEEKLY  EDUCATION / INSTRUCTIONS: BG monitoring instructions: Patient is instructed to check her blood sugars 1 times a day, fasting. Call [facility] if: BG persistently < 70 or > 300. I reviewed the Rule of 15 for the treatment of hypoglycemia in detail with the patient. Literature supplied.   2) Diabetic complications:  Eye: Does not  have known diabetic retinopathy.  Neuro/ Feet: Does  have known diabetic peripheral neuropathy. Renal: Patient does not have known baseline CKD. She is not on an ACEI/ARB at present.Check urine albumin/creatinine ratio yearly starting at time of diagnosis. If albuminuria is positive, treatment is geared toward better glucose, blood pressure control and use of ACE inhibitors or ARBs. Monitor electrolytes and creatinine once to twice yearly.   3) Lipids: Patient is  on a Atorvastatin   4) Candida Vaginitis:   - Will prescribe Fluconazole   F/U in 3 months  Signed electronically by: Mack Guise, MD  [facility]  [address] [place], [place] [address] Phone: [phone] FAX: [phone]   CC: Charlott Rakes, [place] [address] Phone: [phone]  Fax: [phone]    Return to Endocrinology clinic as below: Future Appointments  Date Time Provider [place]  01/07/2021 11:10 AM Daryel November, MD [facility]  01/28/2021  4:10 PM Charlott Rakes, MD [id number] None

## 2020-12-19 NOTE — Patient Instructions (Addendum)
-   STOP Glimepiride  - STOP Novolog Mix  - Start Lantus ( long acting insulin ) 18 units once daily  - Start Trulicity 0.75 mg ONCE WEEKLY    HOW TO TREAT LOW BLOOD SUGARS (Blood sugar LESS THAN 70 MG/DL) Please follow the RULE OF 15 for the treatment of hypoglycemia treatment (when your (blood sugars are less than 70 mg/dL)   STEP 1: Take 15 grams of carbohydrates when your blood sugar is low, which includes:  3-4 GLUCOSE TABS  OR 3-4 OZ OF JUICE OR REGULAR SODA OR ONE TUBE OF GLUCOSE GEL    STEP 2: RECHECK blood sugar in 15 MINUTES STEP 3: If your blood sugar is still low at the 15 minute recheck --> then, go back to STEP 1 and treat AGAIN with another 15 grams of carbohydrates.

## 2020-12-21 DIAGNOSIS — E1142 Type 2 diabetes mellitus with diabetic polyneuropathy: Secondary | ICD-10-CM | POA: Insufficient documentation

## 2020-12-21 DIAGNOSIS — B3731 Acute candidiasis of vulva and vagina: Secondary | ICD-10-CM | POA: Insufficient documentation

## 2020-12-21 DIAGNOSIS — E1165 Type 2 diabetes mellitus with hyperglycemia: Secondary | ICD-10-CM | POA: Insufficient documentation

## 2020-12-21 DIAGNOSIS — Z794 Long term (current) use of insulin: Secondary | ICD-10-CM | POA: Insufficient documentation

## 2020-12-23 ENCOUNTER — Telehealth: Payer: Self-pay | Admitting: Pharmacy Technician

## 2020-12-23 NOTE — Telephone Encounter (Addendum)
Patient Advocate Encounter   Received notification from [facility] MEDICAID that prior authorization for TRULICITY is required.   PA submitted on 12/23/2020 Key [id number] Status is APPROVED through 12/23/2021  [id number]    [facility] will continue to follow.   Netty Starring. Dimas Aguas, CPhT Patient Advocate [facility] Phone: [phone] Fax:  [phone]

## 2021-01-02 ENCOUNTER — Telehealth: Payer: Self-pay | Admitting: Internal Medicine

## 2021-01-02 MED ORDER — NOVOLOG FLEXPEN 100 UNIT/ML ~~LOC~~ SOPN: 6.0000 [IU] | PEN_INJECTOR | Freq: Three times a day (TID) | SUBCUTANEOUS | 11 refills | Status: DC

## 2021-01-02 NOTE — Telephone Encounter (Signed)
Patient just called stating her sugar is over 600 right now and she did not know if she should go to hospital or if she should take more Lantus? She has not been able to take the Trulicity because they pharmacy won't have it until Monday. She also has dry mouth and is feeling dizzy. Patient asks to be called back at [phone]

## 2021-01-02 NOTE — Telephone Encounter (Signed)
Please advise 

## 2021-01-06 NOTE — Telephone Encounter (Signed)
LVM for pt regarding medication change/dosage. Ask that she cb to the office if she has any questions regarding the message with the instructions

## 2021-01-07 ENCOUNTER — Ambulatory Visit: Payer: Medicaid Other | Admitting: Gastroenterology

## 2021-01-19 ENCOUNTER — Other Ambulatory Visit: Payer: Self-pay | Admitting: Family Medicine

## 2021-01-19 DIAGNOSIS — I152 Hypertension secondary to endocrine disorders: Secondary | ICD-10-CM

## 2021-01-19 NOTE — Telephone Encounter (Signed)
   Notes to clinic: Patient has appt on 01/28/2021 Review for another refill   Requested Prescriptions  Pending Prescriptions Disp Refills   metoprolol succinate (TOPROL-XL) 25 MG 24 hr tablet [Pharmacy Med Name: metoprolol succinate ER 25 mg tablet,extended release 24 hr] 30 tablet 0    Sig: Take 1 tablet (25 mg total) by mouth daily.      Cardiovascular:  Beta Blockers Failed - 01/19/2021  9:05 AM      Failed - Valid encounter within last 6 months    Recent Outpatient Visits           9 months ago Type 2 diabetes mellitus with hyperglycemia, with long-term current use of insulin (HCC)   [facility] [place], [place], MD   1 year ago Type 2 diabetes mellitus with hyperglycemia, with long-term current use of insulin (HCC)   [facility] [place], [place], MD   1 year ago Type 2 diabetes mellitus with hyperglycemia, with long-term current use of insulin (HCC)   [facility] [place], Odette Horns, MD   2 years ago Irritable bowel syndrome with diarrhea   [facility] [place], Odette Horns, MD   2 years ago Type 2 diabetes mellitus with hyperglycemia, with long-term current use of insulin (HCC)   [facility] Hoy Register, MD       Future Appointments             In 1 week Hoy Register, MD [facility]             Passed - Last BP in normal range    BP Readings from Last 1 Encounters:  12/19/20 130/80          Passed - Last Heart Rate in normal range    Pulse Readings from Last 1 Encounters:  12/19/20 76

## 2021-01-28 ENCOUNTER — Ambulatory Visit: Payer: Medicaid Other | Admitting: Family Medicine

## 2021-01-28 ENCOUNTER — Ambulatory Visit: Payer: Medicaid Other | Attending: Family Medicine | Admitting: Family Medicine

## 2021-01-28 ENCOUNTER — Other Ambulatory Visit: Payer: Self-pay

## 2021-01-28 ENCOUNTER — Encounter: Payer: Self-pay | Admitting: Family Medicine

## 2021-01-28 VITALS — BP 107/69 | HR 79 | Ht 67.0 in | Wt 146.8 lb

## 2021-01-28 DIAGNOSIS — Z09 Encounter for follow-up examination after completed treatment for conditions other than malignant neoplasm: Secondary | ICD-10-CM | POA: Diagnosis not present

## 2021-01-28 DIAGNOSIS — Z8249 Family history of ischemic heart disease and other diseases of the circulatory system: Secondary | ICD-10-CM | POA: Diagnosis not present

## 2021-01-28 DIAGNOSIS — R197 Diarrhea, unspecified: Secondary | ICD-10-CM | POA: Insufficient documentation

## 2021-01-28 DIAGNOSIS — B373 Candidiasis of vulva and vagina: Secondary | ICD-10-CM | POA: Insufficient documentation

## 2021-01-28 DIAGNOSIS — I110 Hypertensive heart disease with heart failure: Secondary | ICD-10-CM | POA: Insufficient documentation

## 2021-01-28 DIAGNOSIS — K580 Irritable bowel syndrome with diarrhea: Secondary | ICD-10-CM

## 2021-01-28 DIAGNOSIS — F419 Anxiety disorder, unspecified: Secondary | ICD-10-CM | POA: Diagnosis not present

## 2021-01-28 DIAGNOSIS — Z886 Allergy status to analgesic agent status: Secondary | ICD-10-CM | POA: Insufficient documentation

## 2021-01-28 DIAGNOSIS — Z79899 Other long term (current) drug therapy: Secondary | ICD-10-CM | POA: Diagnosis not present

## 2021-01-28 DIAGNOSIS — Z8616 Personal history of COVID-19: Secondary | ICD-10-CM | POA: Insufficient documentation

## 2021-01-28 DIAGNOSIS — I10 Essential (primary) hypertension: Secondary | ICD-10-CM

## 2021-01-28 DIAGNOSIS — K219 Gastro-esophageal reflux disease without esophagitis: Secondary | ICD-10-CM | POA: Diagnosis not present

## 2021-01-28 DIAGNOSIS — M65341 Trigger finger, right ring finger: Secondary | ICD-10-CM | POA: Diagnosis not present

## 2021-01-28 DIAGNOSIS — B3731 Acute candidiasis of vulva and vagina: Secondary | ICD-10-CM

## 2021-01-28 DIAGNOSIS — Z713 Dietary counseling and surveillance: Secondary | ICD-10-CM | POA: Insufficient documentation

## 2021-01-28 DIAGNOSIS — Z794 Long term (current) use of insulin: Secondary | ICD-10-CM | POA: Insufficient documentation

## 2021-01-28 DIAGNOSIS — E1169 Type 2 diabetes mellitus with other specified complication: Secondary | ICD-10-CM

## 2021-01-28 DIAGNOSIS — Z7901 Long term (current) use of anticoagulants: Secondary | ICD-10-CM | POA: Insufficient documentation

## 2021-01-28 DIAGNOSIS — Z888 Allergy status to other drugs, medicaments and biological substances status: Secondary | ICD-10-CM | POA: Diagnosis not present

## 2021-01-28 DIAGNOSIS — I5022 Chronic systolic (congestive) heart failure: Secondary | ICD-10-CM | POA: Insufficient documentation

## 2021-01-28 DIAGNOSIS — E1159 Type 2 diabetes mellitus with other circulatory complications: Secondary | ICD-10-CM

## 2021-01-28 DIAGNOSIS — Z7182 Exercise counseling: Secondary | ICD-10-CM | POA: Insufficient documentation

## 2021-01-28 DIAGNOSIS — E1149 Type 2 diabetes mellitus with other diabetic neurological complication: Secondary | ICD-10-CM

## 2021-01-28 DIAGNOSIS — E785 Hyperlipidemia, unspecified: Secondary | ICD-10-CM

## 2021-01-28 DIAGNOSIS — I152 Hypertension secondary to endocrine disorders: Secondary | ICD-10-CM

## 2021-01-28 MED ORDER — METOPROLOL SUCCINATE ER 25 MG PO TB24: 25.0000 mg | ORAL_TABLET | Freq: Every day | ORAL | 1 refills | Status: DC

## 2021-01-28 MED ORDER — GABAPENTIN 300 MG PO CAPS: 600.0000 mg | ORAL_CAPSULE | Freq: Two times a day (BID) | ORAL | 1 refills | Status: DC

## 2021-01-28 MED ORDER — ROSUVASTATIN CALCIUM 10 MG PO TABS: 10.0000 mg | ORAL_TABLET | Freq: Every day | ORAL | 1 refills | Status: DC

## 2021-01-28 MED ORDER — FLUCONAZOLE 150 MG PO TABS: 150.0000 mg | ORAL_TABLET | Freq: Once | ORAL | 0 refills | Status: AC

## 2021-01-28 MED ORDER — DIPHENOXYLATE-ATROPINE 2.5-0.025 MG PO TABS
1.0000 | ORAL_TABLET | Freq: Four times a day (QID) | ORAL | 1 refills | Status: DC | PRN
Start: 2021-01-28 — End: 2021-02-19

## 2021-01-28 MED ORDER — DICYCLOMINE HCL 10 MG PO CAPS: 10.0000 mg | ORAL_CAPSULE | Freq: Three times a day (TID) | ORAL | 1 refills | Status: DC

## 2021-01-28 NOTE — Progress Notes (Signed)
Needs refills on medications. 

## 2021-01-28 NOTE — Progress Notes (Signed)
Subjective:  Patient ID: [name], female    DOB: 1961-03-07  Age: 60 y.o. MRN: [id number]  CC: Hypertension   HPI [name]  is a 60 year old female with a history of type 2 diabetes mellitus (A1c 11.9), hypertension, CHF (EF 55-60%  2-D echo of 09/2016 which has improved from 25%-30% previously), anxiety, GERD, COVID-19 in 01/2020 who presents today for follow-up visit.  Interval History: Complains of burning in her hands and they feel like she has a cut in them and at night she feels the numbness in her legs. Gabapentin is ineffective. Hands burn and she has to run it under cold water. She is unable to completely flex her right 4th and 5th fingers.  Not taking Lipitor as it causes her to cough and cough resolved when she stopped Lipitor  Still has Diarrhea and has to use Lomotil.  Has upcoming appointment with GI.  Does not need Spiriva as much and just uses albuterol as she had headache with Spiriva  Complains of a yeast infection. Diabetes is still uncontrolled and is managed by endocrinology.  She endorses compliance with her medications Past Medical History:  Diagnosis Date   Anxiety    Arthritis    Chronic systolic heart failure (HCC)    EF 20-25% ECHO 05/2015   COPD (chronic obstructive pulmonary disease) (HCC)    Depression    Diabetes mellitus without complication (HCC)    Type II   Dysrhythmia    pt unsure of name of arrythmia - " my heart rate will drop all of a sudden" -no current treatment - atrial flutter   Fibrillation, atrial (HCC)    Gallstones    GERD (gastroesophageal reflux disease)    Headache(784.0)    migraines   Hypertension    Osteomyelitis (HCC)    R ankle   Rash    arms    Past Surgical History:  Procedure Laterality Date   ANKLE FRACTURE SURGERY Right 2015   CARDIAC CATHETERIZATION N/A 06/24/2015   Procedure: Left Heart Cath and Coronary Angiography;  Surgeon: Belva Crome, MD;  Location: [facility] [facility];   Service: Cardiovascular;  Laterality: N/A;   CARPAL TUNNEL RELEASE     bil   CHOLECYSTECTOMY  11/24/2011   Procedure: LAPAROSCOPIC CHOLECYSTECTOMY WITH INTRAOPERATIVE CHOLANGIOGRAM;  Surgeon: Joyice Faster. Cornett, MD;  Location: [facility];  Service: General;  Laterality: N/A;  Laparoscopic Cholecystectomy with Cholangiogram   COLONOSCOPY     ECTOPIC PREGNANCY SURGERY  many yrs ago   HARDWARE REMOVAL Right 07/15/2015   Procedure: RIGHT ANKLE HARDWARE REMOVAL, PLACEMENT OF STIMULAN ANTIBIOTIC BEADS AND PREVENA WOUND VAC.;  Surgeon: Meredith Pel, MD;  Location: [facility];  Service: Orthopedics;  Laterality: Right;   TOOTH EXTRACTION N/A 12/03/2016   Procedure: DENTAL EXTRACTIONS with Alveoloplasty;  Surgeon: Diona Browner, DDS;  Location: [facility];  Service: Oral Surgery;  Laterality: N/A;    Family History  Problem Relation Age of Onset   Diabetes Mother    Lung cancer Father    Heart attack Father 75       CABG x4   Breast cancer Sister        Survivor     Allergies  Allergen Reactions   Biaxin [Clarithromycin] Anaphylaxis and Swelling   Clarithromycin Shortness Of Breath and Swelling   Lisinopril Swelling and Cough    Lip edema   Sulfa Antibiotics Anaphylaxis, Shortness Of Breath, Swelling and Hypertension   Chlorthalidone Nausea And Vomiting and Other (  See Comments)    Clammy, Tachycardia, Headache    Daptomycin Nausea And Vomiting   Latex Hives and Rash   Amoxicillin-Pot Clavulanate Diarrhea    Has patient had a PCN reaction causing immediate rash, facial/tongue/throat swelling, SOB or lightheadedness with hypotension:No Has patient had a PCN reaction causing severe rash involving mucus membranes or skin necrosis:No Has patient had a PCN reaction that required hospitalization:No Has patient had a PCN reaction occurring within THE LAST 10 YEARS.  #  #  #  YES  #  #  #  If all of the above answers are "NO", then may proceed with Cephalosporin use.    Aspirin Nausea And Vomiting and  Rash    On 388m dosage   Cholestyramine Nausea And Vomiting   Dilaudid [Hydromorphone Hcl] Nausea And Vomiting   Lasix [Furosemide] Nausea And Vomiting and Other (See Comments)    Headache     Outpatient Medications Prior to Visit  Medication Sig Dispense Refill   albuterol (VENTOLIN HFA) 108 (90 Base) MCG/ACT inhaler Inhale 2 puffs into the lungs every 4 (four) hours as needed for wheezing or shortness of breath. 8.5 g 2   benzonatate (TESSALON) 200 MG capsule Take 1 capsule (200 mg total) by mouth every 8 (eight) hours. 21 capsule 0   Blood Glucose Monitoring Suppl (ACCU-CHEK GUIDE) w/Device KIT 1 each by Does not apply route 4 (four) times daily. 1 kit 0   Dulaglutide (TRULICITY) 05.46MTK/3.5WSSOPN Inject 0.75 mg into the skin once a week. 2 mL 4   esomeprazole (NEXIUM) 20 MG capsule Take 1 capsule (20 mg total) by mouth daily. 90 capsule 1   FLUoxetine (PROZAC) 40 MG capsule Take 1 capsule (40 mg total) by mouth at bedtime. 90 capsule 1   glucose blood (ONETOUCH VERIO) test strip 1 each by Other route daily in the afternoon. Use as instructed 100 each 6   hydrOXYzine (ATARAX/VISTARIL) 10 MG tablet Take 1 tablet (10 mg total) by mouth 3 (three) times daily as needed. 60 tablet 0   insulin aspart (NOVOLOG FLEXPEN) 100 UNIT/ML FlexPen Inject 6 Units into the skin 3 (three) times daily with meals. 15 mL 11   insulin glargine (LANTUS SOLOSTAR) 100 UNIT/ML Solostar Pen Inject 18 Units into the skin daily. 15 mL 6   Insulin Pen Needle 32G X 4 MM MISC 1 Device by Does not apply route daily. 100 each 2   Lancet Devices (ACCU-CHEK SOFTCLIX) lancets Use as instructed daily. 1 each 5   meclizine (ANTIVERT) 25 MG tablet Take 1 tablet (25 mg total) by mouth 3 (three) times daily as needed for dizziness. 90 tablet 0   tiotropium (SPIRIVA) 18 MCG inhalation capsule Place 1 capsule (18 mcg total) into inhaler and inhale daily. (Patient not taking: No sig reported) 90 capsule 1   atorvastatin (LIPITOR) 40  MG tablet Take 1 tablet (40 mg total) by mouth daily. (Patient not taking: Reported on 12/19/2020) 30 tablet 1   dicyclomine (BENTYL) 10 MG capsule Take 1 capsule (10 mg total) by mouth 3 (three) times daily before meals. 270 capsule 1   diphenoxylate-atropine (LOMOTIL) 2.5-0.025 MG tablet Take 1 tablet by mouth 4 (four) times daily as needed for diarrhea or loose stools. 30 tablet 1   doxycycline (VIBRA-TABS) 100 MG tablet Take 1 tablet (100 mg total) by mouth 2 (two) times daily. (Patient not taking: No sig reported) 20 tablet 0   gabapentin (NEURONTIN) 300 MG capsule Take 1 capsule (300 mg  total) by mouth 2 (two) times daily. (Patient not taking: Reported on 12/19/2020) 180 capsule 3   metoprolol succinate (TOPROL-XL) 25 MG 24 hr tablet Take 1 tablet (25 mg total) by mouth daily. 30 tablet 0   Facility-Administered Medications Prior to Visit  Medication Dose Route Frequency Provider Last Rate Last Admin   regadenoson (LEXISCAN) injection SOLN 0.4 mg  0.4 mg Intravenous Once Dorothy Spark, MD       technetium tetrofosmin (TC-MYOVIEW) injection 43.8 millicurie  88.7 millicurie Intravenous Once PRN Dorothy Spark, MD         ROS Review of Systems  Constitutional:  Negative for activity change, appetite change and fatigue.  HENT:  Negative for congestion, sinus pressure and sore throat.   Eyes:  Negative for visual disturbance.  Respiratory:  Negative for cough, chest tightness, shortness of breath and wheezing.   Cardiovascular:  Negative for chest pain and palpitations.  Gastrointestinal:  Positive for diarrhea. Negative for abdominal distention, abdominal pain and constipation.  Endocrine: Negative for polydipsia.  Genitourinary:  Positive for vaginal discharge. Negative for dysuria and frequency.  Musculoskeletal:        See HPI  Skin:  Negative for rash.  Neurological:  Negative for tremors, light-headedness and numbness.  Hematological:  Does not bruise/bleed easily.   Psychiatric/Behavioral:  Negative for agitation and behavioral problems.    Objective:  BP 107/69   Pulse 79   Ht _0  (1.702 m)   Wt 146 lb 12.8 oz (66.6 kg)   LMP 07/24/2011   SpO2 98%   BMI 22.99 kg/m   BP/Weight 01/28/2021 12/19/2020 5/79/7282  Systolic BP 060 156 153  Diastolic BP 69 80 72  Wt. (Lbs) 146.8 152.4 148  BMI 22.99 23.87 23.18      Physical Exam Constitutional:      Appearance: She is well-developed.  Cardiovascular:     Rate and Rhythm: Normal rate.     Heart sounds: Normal heart sounds. No murmur heard. Pulmonary:     Effort: Pulmonary effort is normal.     Breath sounds: Normal breath sounds. No wheezing or rales.  Chest:     Chest wall: No tenderness.  Abdominal:     General: Bowel sounds are normal. There is no distension.     Palpations: Abdomen is soft. There is no mass.     Tenderness: There is no abdominal tenderness.  Musculoskeletal:        General: Normal range of motion.     Right lower leg: No edema.     Left lower leg: No edema.     Comments: Unable to make a complete fist with right ring and fifth fingers  Neurological:     Mental Status: She is alert and oriented to person, place, and time.  Psychiatric:        Mood and Affect: Mood normal.    CMP Latest Ref Rng & Units 10/25/2020 07/11/2020 04/02/2020  Glucose 70 - 99 mg/dL 497(H) 337(H) 121(H)  BUN 6 - 20 mg/dL _1 Creatinine 0.44 - 1.00 mg/dL 0.64 0.63 0.52(L)  Sodium 135 - 145 mmol/L 131(L) 136 139  Potassium 3.5 - 5.1 mmol/L 3.8 4.4 3.9  Chloride 98 - 111 mmol/L 97(L) 98 102  CO2 22 - 32 mmol/L _2 Calcium 8.9 - 10.3 mg/dL 8.9 9.4 9.5  Total Protein 6.0 - 8.5 g/dL - 7.3 -  Total Bilirubin 0.0 - 1.2 mg/dL - 0.4 -  Alkaline Phos  44 - 121 IU/L - 122(H) -  AST 0 - 40 IU/L - 19 -  ALT 0 - 32 IU/L - 23 -    Lipid Panel     Component Value Date/Time   CHOL 151 07/31/2019 1004   TRIG 287 (H) 07/31/2019 1004   HDL 27 (L) 07/31/2019 1004   CHOLHDL 5.6 (H)  07/31/2019 1004   CHOLHDL 5.1 (H) 07/05/2016 0947   VLDL UNABLE TO CALCULATE IF TRIGLYCERIDE OVER 400 mg/dL 05/24/2016 0415   LDLCALC 77 07/31/2019 1004    CBC    Component Value Date/Time   WBC 12.5 (H) 10/25/2020 1722   RBC 5.29 (H) 10/25/2020 1722   HGB 15.2 (H) 10/25/2020 1722   HCT 46.4 (H) 10/25/2020 1722   PLT 215 10/25/2020 1722   MCV 87.7 10/25/2020 1722   MCH 28.7 10/25/2020 1722   MCHC 32.8 10/25/2020 1722   RDW 12.3 10/25/2020 1722   LYMPHSABS 2.6 04/01/2016 2144   MONOABS 0.4 04/01/2016 2144   EOSABS 0.3 04/01/2016 2144   BASOSABS 0.0 04/01/2016 2144    Lab Results  Component Value Date   HGBA1C 11.9 (A) 12/19/2020    Assessment & Plan:  1. 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 Advised on the need for annual eye exams, annual foot exams, Pneumonia vaccine. - gabapentin (NEURONTIN) 300 MG capsule; Take 2 capsules (600 mg total) by mouth 2 (two) times daily.  Dispense: 180 capsule; Refill: 1 - rosuvastatin (CRESTOR) 10 MG tablet; Take 1 tablet (10 mg total) by mouth daily.  Dispense: 90 tablet; Refill: 1  2. Irritable bowel syndrome with diarrhea Uncontrolled She has upcoming appointment with GI - dicyclomine (BENTYL) 10 MG capsule; Take 1 capsule (10 mg total) by mouth 3 (three) times daily before meals.  Dispense: 270 capsule; Refill: 1 - diphenoxylate-atropine (LOMOTIL) 2.5-0.025 MG tablet; Take 1 tablet by mouth 4 (four) times daily as needed for diarrhea or loose stools.  Dispense: 60 tablet; Refill: 1  3.  Hypertension complicating diabetes ([place]) Controlled Counseled on blood pressure goal of less than 130/80, low-sodium, DASH diet, medication compliance, 150 minutes of moderate intensity exercise per week. Discussed medication compliance, adverse effects. - metoprolol succinate (TOPROL-XL) 25 MG 24 hr tablet; Take 1 tablet (25 mg total) by mouth daily.  Dispense: 90 tablet; Refill: 1  4. Trigger ring finger of right hand - AMB referral to orthopedics  5. Vaginal candidiasis Likely due to hyperglycemic state - fluconazole (DIFLUCAN) 150 MG tablet; Take 1 tablet (150 mg total) by mouth once for 1 dose.  Dispense: 1 tablet; Refill: 0  6.  Hyperlipidemia associated with type 2 diabetes mellitus Unable to tolerate Lipitor Switch to Crestor Low-cholesterol diet Meds ordered this encounter  Medications   gabapentin (NEURONTIN) 300 MG capsule    Sig: Take 2 capsules (600 mg total) by mouth 2 (two) times daily.    Dispense:  180 capsule    Refill:  1    Dose increase   rosuvastatin (CRESTOR) 10 MG tablet    Sig: Take 1 tablet (10 mg total) by mouth daily.  Dispense:  90 tablet    Refill:  1    Discontinue Lipitor   dicyclomine (BENTYL) 10 MG capsule    Sig: Take 1 capsule (10 mg total) by mouth 3 (three) times daily before meals.    Dispense:  270 capsule    Refill:  1   diphenoxylate-atropine (LOMOTIL) 2.5-0.025 MG tablet    Sig: Take 1 tablet by mouth 4 (four) times daily as needed for diarrhea or loose stools.    Dispense:  60 tablet    Refill:  1    This prescription was filled on 12/17/2020. Any refills authorized will be placed on file.   metoprolol succinate (TOPROL-XL) 25 MG 24 hr tablet    Sig: Take 1 tablet (25 mg total) by mouth daily.    Dispense:  90 tablet    Refill:  1   fluconazole (DIFLUCAN) 150 MG tablet    Sig: Take 1 tablet (150 mg total) by mouth once for 1 dose.    Dispense:  1 tablet    Refill:  0    Follow-up: Return in about 1 month (around 02/28/2021)  for complete physical exam.       Charlott Rakes, MD, FAAFP. [facility] [place], [place]   01/28/2021, 5:45 PM

## 2021-01-29 ENCOUNTER — Telehealth: Payer: Self-pay | Admitting: Family Medicine

## 2021-01-29 ENCOUNTER — Other Ambulatory Visit: Payer: Self-pay | Admitting: Family Medicine

## 2021-01-29 DIAGNOSIS — F419 Anxiety disorder, unspecified: Secondary | ICD-10-CM

## 2021-01-29 MED ORDER — ONETOUCH VERIO W/DEVICE KIT: PACK | 0 refills | Status: DC

## 2021-01-29 NOTE — Telephone Encounter (Signed)
[facility]ique calling from [facility] is calling to request a change in prescription pt has medicaid - accu check guide And will need a script for lancets.. Please advise Cb- [phone]

## 2021-01-30 ENCOUNTER — Other Ambulatory Visit: Payer: Self-pay | Admitting: Pharmacist

## 2021-01-30 DIAGNOSIS — E1149 Type 2 diabetes mellitus with other diabetic neurological complication: Secondary | ICD-10-CM

## 2021-01-30 MED ORDER — ACCU-CHEK GUIDE ME W/DEVICE KIT: PACK | 0 refills | Status: DC

## 2021-01-30 MED ORDER — ACCU-CHEK GUIDE VI STRP: ORAL_STRIP | 2 refills | Status: DC

## 2021-01-30 MED ORDER — ACCU-CHEK SOFTCLIX LANCETS MISC: 2 refills | Status: DC

## 2021-01-30 NOTE — Telephone Encounter (Signed)
Rx sent 

## 2021-02-03 ENCOUNTER — Ambulatory Visit: Payer: Medicaid Other | Admitting: Nutrition

## 2021-02-11 ENCOUNTER — Ambulatory Visit: Payer: Medicaid Other | Admitting: Orthopedic Surgery

## 2021-02-16 ENCOUNTER — Ambulatory Visit: Payer: Medicaid Other | Admitting: Internal Medicine

## 2021-02-17 ENCOUNTER — Ambulatory Visit: Payer: Medicaid Other | Admitting: Gastroenterology

## 2021-02-17 ENCOUNTER — Other Ambulatory Visit: Payer: Self-pay | Admitting: Family Medicine

## 2021-02-17 DIAGNOSIS — K580 Irritable bowel syndrome with diarrhea: Secondary | ICD-10-CM

## 2021-02-17 DIAGNOSIS — Z8719 Personal history of other diseases of the digestive system: Secondary | ICD-10-CM

## 2021-02-17 NOTE — Telephone Encounter (Signed)
  Notes to clinic:  This prescription was filled on 02/17/2021. Any refills authorized will be placed on file   Requested Prescriptions  Pending Prescriptions Disp Refills   esomeprazole (NEXIUM) 20 MG capsule [Pharmacy Med Name: esomeprazole magnesium 20 mg capsule,delayed release] 90 capsule 1    Sig: Take 1 capsule (20 mg total) by mouth daily.     Gastroenterology: Proton Pump Inhibitors Passed - 02/17/2021  9:41 AM      Passed - Valid encounter within last 12 months    Recent Outpatient Visits           2 weeks ago Trigger ring finger of right hand   [facility] [place], [place], MD   10 months ago Type 2 diabetes mellitus with hyperglycemia, with long-term current use of insulin (HCC)   [facility] [place], [place], MD   1 year ago Type 2 diabetes mellitus with hyperglycemia, with long-term current use of insulin (HCC)   [facility] [place], [place], MD   1 year ago Type 2 diabetes mellitus with hyperglycemia, with long-term current use of insulin (HCC)   [facility] [place], Odette Horns, MD   2 years ago Irritable bowel syndrome with diarrhea   [facility] [place], Odette Horns, MD       Future Appointments             In 2 weeks Hoy Register, MD [facility]             diphenoxylate-atropine (LOMOTIL) 2.5-0.025 MG tablet [Pharmacy Med Name: diphenoxylate-atropine 2.5 mg-0.025 mg tablet] 60 tablet 1    Sig: Take 1 tablet by mouth 4 (four) times daily as needed for diarrhea or loose stools.     Not Delegated - Gastroenterology:  Antidiarrheals Failed - 02/17/2021  9:41 AM      Failed - This refill cannot be delegated      Passed - Valid encounter within last 12 months    Recent Outpatient Visits           2 weeks ago Trigger ring finger of right hand   [facility]  [place], [place], MD   10 months ago Type 2 diabetes mellitus with hyperglycemia, with long-term current use of insulin (HCC)   [facility] [place], [place], MD   1 year ago Type 2 diabetes mellitus with hyperglycemia, with long-term current use of insulin (HCC)   [facility] [place], Odette Horns, MD   1 year ago Type 2 diabetes mellitus with hyperglycemia, with long-term current use of insulin (HCC)    [facility] [place], Odette Horns, MD   2 years ago Irritable bowel syndrome with diarrhea    [facility] Hoy Register, MD       Future Appointments             In 2 weeks Hoy Register, MD [facility]

## 2021-03-09 ENCOUNTER — Ambulatory Visit: Payer: Medicaid Other | Attending: Family Medicine | Admitting: Family Medicine

## 2021-03-09 ENCOUNTER — Encounter: Payer: Self-pay | Admitting: Family Medicine

## 2021-03-09 ENCOUNTER — Other Ambulatory Visit (HOSPITAL_COMMUNITY)
Admission: RE | Admit: 2021-03-09 | Discharge: 2021-03-09 | Disposition: A | Discharge status: Home / Self Care | Payer: Medicaid Other | Source: Ambulatory Visit | Attending: Family Medicine | Admitting: Family Medicine

## 2021-03-09 ENCOUNTER — Other Ambulatory Visit: Payer: Self-pay

## 2021-03-09 VITALS — BP 119/82 | HR 115 | Resp 16 | Wt 141.6 lb

## 2021-03-09 DIAGNOSIS — Z1231 Encounter for screening mammogram for malignant neoplasm of breast: Secondary | ICD-10-CM

## 2021-03-09 DIAGNOSIS — F17210 Nicotine dependence, cigarettes, uncomplicated: Secondary | ICD-10-CM

## 2021-03-09 DIAGNOSIS — Z124 Encounter for screening for malignant neoplasm of cervix: Secondary | ICD-10-CM

## 2021-03-09 DIAGNOSIS — R5383 Other fatigue: Secondary | ICD-10-CM | POA: Diagnosis not present

## 2021-03-09 DIAGNOSIS — Z1211 Encounter for screening for malignant neoplasm of colon: Secondary | ICD-10-CM

## 2021-03-09 DIAGNOSIS — Z0000 Encounter for general adult medical examination without abnormal findings: Secondary | ICD-10-CM | POA: Diagnosis not present

## 2021-03-09 DIAGNOSIS — Z23 Encounter for immunization: Secondary | ICD-10-CM | POA: Diagnosis not present

## 2021-03-09 DIAGNOSIS — R634 Abnormal weight loss: Secondary | ICD-10-CM

## 2021-03-09 DIAGNOSIS — Z794 Long term (current) use of insulin: Secondary | ICD-10-CM

## 2021-03-09 DIAGNOSIS — E1149 Type 2 diabetes mellitus with other diabetic neurological complication: Secondary | ICD-10-CM

## 2021-03-09 DIAGNOSIS — L0292 Furuncle, unspecified: Secondary | ICD-10-CM | POA: Diagnosis not present

## 2021-03-09 MED ORDER — CEPHALEXIN 500 MG PO CAPS: 500.0000 mg | ORAL_CAPSULE | Freq: Four times a day (QID) | ORAL | 0 refills | Status: DC

## 2021-03-09 NOTE — Progress Notes (Signed)
 Subjective:  Patient ID: [name], female    DOB: 05/23/1961  Age: 60 y.o. MRN: [id number]  CC: Annual Exam and Gynecologic Exam   HPI Lyric Booker Sakurai is a 60 y.o. year old female with a history of type 2 diabetes mellitus (A1c 11.9), hypertension, CHF (EF 55-60%  2-D echo of 09/2016 which has improved from 25%-30% previously), anxiety, GERD, COVID-19 in 01/2020. She presents for a complete physical exam.  Interval History: She does not feel good and has been loosing weight . Complains of fatigue for the last 2 months. She has urinary frequency and has a sense of incomplete voiding.  Type 2 diabetes mellitus is managed by endocrine-Dr. Shamleffer. She complains her bowel movement 'feels like jelo'.currently followed by GI but is yet to undergo colonoscopy.  Describes it as slime that is not colored. It also sometimes comes out without her going to the bathroom. She does have chronic diarrhea and hemorrhoids and I had referred her to general surgery but she had to cancel her appointment as she had COVID. She has a >20 pack year history of smoking.  Also complains of erythematous lesions on her face. Past Medical History:  Diagnosis Date   Anxiety    Arthritis    Chronic systolic heart failure (HCC)    EF 20-25% ECHO 05/2015   COPD (chronic obstructive pulmonary disease) (HCC)    Depression    Diabetes mellitus without complication (HCC)    Type II   Dysrhythmia    pt unsure of name of arrythmia - " my heart rate will drop all of a sudden" -no current treatment - atrial flutter   Fibrillation, atrial (HCC)    Gallstones    GERD (gastroesophageal reflux disease)    Headache(784.0)    migraines   Hypertension    Osteomyelitis (HCC)    R ankle   Rash    arms    Past Surgical History:  Procedure Laterality Date   ANKLE FRACTURE SURGERY Right 2015   CARDIAC CATHETERIZATION N/A 06/24/2015   Procedure: Left Heart Cath and Coronary Angiography;  Surgeon: Henry W  Smith, MD;  Location: [facility];  Service: Cardiovascular;  Laterality: N/A;   CARPAL TUNNEL RELEASE     bil   CHOLECYSTECTOMY  11/24/2011   Procedure: LAPAROSCOPIC CHOLECYSTECTOMY WITH INTRAOPERATIVE CHOLANGIOGRAM;  Surgeon: Thomas A. Cornett, MD;  Location: [facility];  Service: General;  Laterality: N/A;  Laparoscopic Cholecystectomy with Cholangiogram   COLONOSCOPY     ECTOPIC PREGNANCY SURGERY  many yrs ago   HARDWARE REMOVAL Right 07/15/2015   Procedure: RIGHT ANKLE HARDWARE REMOVAL, PLACEMENT OF STIMULAN ANTIBIOTIC BEADS AND PREVENA WOUND VAC.;  Surgeon: Scott Gregory Dean, MD;  Location: [facility];  Service: Orthopedics;  Laterality: Right;   TOOTH EXTRACTION N/A 12/03/2016   Procedure: DENTAL EXTRACTIONS with Alveoloplasty;  Surgeon: Jensen, Scott, DDS;  Location: [facility];  Service: Oral Surgery;  Laterality: N/A;    Family History  Problem Relation Age of Onset   Diabetes Mother    Lung cancer Father    Heart attack Father 49       CABG x4   Breast cancer Sister        Survivor     Allergies  Allergen Reactions   Biaxin [Clarithromycin] Anaphylaxis and Swelling   Clarithromycin Shortness Of Breath and Swelling   Lisinopril Swelling and Cough    Lip edema   Sulfa Antibiotics Anaphylaxis, Shortness Of Breath, Swelling and Hypertension   Chlorthalidone   Nausea And Vomiting and Other (See Comments)    Clammy, Tachycardia, Headache    Daptomycin Nausea And Vomiting   Latex Hives and Rash   Amoxicillin-Pot Clavulanate Diarrhea    Has patient had a PCN reaction causing immediate rash, facial/tongue/throat swelling, SOB or lightheadedness with hypotension:No Has patient had a PCN reaction causing severe rash involving mucus membranes or skin necrosis:No Has patient had a PCN reaction that required hospitalization:No Has patient had a PCN reaction occurring within THE LAST 10 YEARS.  #  #  #  YES  #  #  #  If all of the above answers are "NO", then may proceed with Cephalosporin  use.    Aspirin Nausea And Vomiting and Rash    On 358m dosage   Cholestyramine Nausea And Vomiting   Dilaudid [Hydromorphone Hcl] Nausea And Vomiting   Lasix [Furosemide] Nausea And Vomiting and Other (See Comments)    Headache     Outpatient Medications Prior to Visit  Medication Sig Dispense Refill   Accu-Chek Softclix Lancets lancets Use to check blood sugar TID. 100 each 2   albuterol (VENTOLIN HFA) 108 (90 Base) MCG/ACT inhaler Inhale 2 puffs into the lungs every 4 (four) hours as needed for wheezing or shortness of breath. 8.5 g 2   Blood Glucose Monitoring Suppl (ACCU-CHEK GUIDE ME) w/Device KIT Use to check blood sugar TID. 1 kit 0   dicyclomine (BENTYL) 10 MG capsule Take 1 capsule (10 mg total) by mouth 3 (three) times daily before meals. 270 capsule 1   diphenoxylate-atropine (LOMOTIL) 2.5-0.025 MG tablet Take 1 tablet by mouth 4 (four) times daily as needed for diarrhea or loose stools. 60 tablet 1   esomeprazole (NEXIUM) 20 MG capsule Take 1 capsule (20 mg total) by mouth daily. 90 capsule 1   FLUoxetine (PROZAC) 40 MG capsule Take 1 capsule (40 mg total) by mouth at bedtime. 90 capsule 1   gabapentin (NEURONTIN) 300 MG capsule Take 2 capsules (600 mg total) by mouth 2 (two) times daily. 180 capsule 1   glucose blood (ACCU-CHEK GUIDE) test strip Use to check blood sugar TID. 100 each 2   hydrOXYzine (ATARAX/VISTARIL) 10 MG tablet Take 1 tablet (10 mg total) by mouth 3 (three) times daily as needed. 60 tablet 0   insulin aspart (NOVOLOG FLEXPEN) 100 UNIT/ML FlexPen Inject 6 Units into the skin 3 (three) times daily with meals. 15 mL 11   insulin glargine (LANTUS SOLOSTAR) 100 UNIT/ML Solostar Pen Inject 18 Units into the skin daily. 15 mL 6   Insulin Pen Needle 32G X 4 MM MISC 1 Device by Does not apply route daily. 100 each 2   meclizine (ANTIVERT) 25 MG tablet Take 1 tablet (25 mg total) by mouth 3 (three) times daily as needed for dizziness. 90 tablet 0   metoprolol  succinate (TOPROL-XL) 25 MG 24 hr tablet Take 1 tablet (25 mg total) by mouth daily. 90 tablet 1   rosuvastatin (CRESTOR) 10 MG tablet Take 1 tablet (10 mg total) by mouth daily. 90 tablet 1   benzonatate (TESSALON) 200 MG capsule Take 1 capsule (200 mg total) by mouth every 8 (eight) hours. (Patient not taking: Reported on 03/09/2021) 21 capsule 0   Dulaglutide (TRULICITY) 03.00MPQ/3.3AQSOPN Inject 0.75 mg into the skin once a week. (Patient not taking: Reported on 03/09/2021) 2 mL 4   Lancet Devices (ACCU-CHEK SOFTCLIX) lancets Use as instructed daily. (Patient not taking: Reported on 03/09/2021) 1 each 5  tiotropium (SPIRIVA) 18 MCG inhalation capsule Place 1 capsule (18 mcg total) into inhaler and inhale daily. (Patient not taking: No sig reported) 90 capsule 1   Facility-Administered Medications Prior to Visit  Medication Dose Route Frequency Provider Last Rate Last Admin   regadenoson (LEXISCAN) injection SOLN 0.4 mg  0.4 mg Intravenous Once Dorothy Spark, MD       technetium tetrofosmin (TC-MYOVIEW) injection 60.6 millicurie  30.1 millicurie Intravenous Once PRN Dorothy Spark, MD         ROS Review of Systems  Constitutional:  Positive for unexpected weight change. Negative for activity change, appetite change and fatigue.  HENT:  Negative for congestion, sinus pressure and sore throat.   Eyes:  Negative for visual disturbance.  Respiratory:  Negative for cough, chest tightness, shortness of breath and wheezing.   Cardiovascular:  Negative for chest pain and palpitations.  Gastrointestinal:  Positive for diarrhea. Negative for abdominal distention, abdominal pain and constipation.  Endocrine: Negative for polydipsia.  Genitourinary:  Positive for frequency. Negative for dysuria.  Musculoskeletal:  Negative for arthralgias and back pain.  Skin:  Positive for rash.  Neurological:  Negative for tremors, light-headedness and numbness.  Hematological:  Does not bruise/bleed  easily.  Psychiatric/Behavioral:  Negative for agitation and behavioral problems.    Objective:  BP 119/82   Pulse (!) 115   Resp 16   Wt 141 lb 9.6 oz (64.2 kg)   LMP 07/24/2011   SpO2 94%   BMI 22.18 kg/m   BP/Weight 03/09/2021 01/28/2021 11/27/930  Systolic BP 355 732 202  Diastolic BP 82 69 80  Wt. (Lbs) 141.6 146.8 152.4  BMI 22.18 22.99 23.87      Physical Exam Exam conducted with a chaperone present.  Constitutional:      General: She is not in acute distress.    Appearance: She is well-developed. She is not diaphoretic.  HENT:     Head: Normocephalic.     Right Ear: External ear normal.     Left Ear: External ear normal.     Nose: Nose normal.  Eyes:     Conjunctiva/sclera: Conjunctivae normal.     Pupils: Pupils are equal, round, and reactive to light.  Neck:     Vascular: No JVD.  Cardiovascular:     Rate and Rhythm: Normal rate and regular rhythm.     Heart sounds: Normal heart sounds. No murmur heard.   No gallop.  Pulmonary:     Effort: Pulmonary effort is normal. No respiratory distress.     Breath sounds: Normal breath sounds. No wheezing or rales.  Chest:     Chest wall: No tenderness.  Breasts:    Right: Normal. No mass, nipple discharge or tenderness.     Left: Normal. No mass, nipple discharge or tenderness.  Abdominal:     General: Bowel sounds are normal. There is no distension.     Palpations: Abdomen is soft. There is no mass.     Tenderness: There is no abdominal tenderness.     Hernia: There is no hernia in the left inguinal area or right inguinal area.  Genitourinary:    General: Normal vulva.     Pubic Area: No rash.      Labia:        Right: No rash.        Left: No rash.      Vagina: Normal.     Cervix: Normal.     Uterus: Normal. With uterine  prolapse.      Adnexa: Right adnexa normal and left adnexa normal.       Right: No tenderness.         Left: No tenderness.    Musculoskeletal:        General: No tenderness. Normal  range of motion.     Cervical back: Normal range of motion. No tenderness.  Lymphadenopathy:     Upper Body:     Right upper body: No supraclavicular or axillary adenopathy.     Left upper body: No supraclavicular or axillary adenopathy.  Skin:    Comments: Furuncles on face  Neurological:     Mental Status: She is alert and oriented to person, place, and time.     Deep Tendon Reflexes: Reflexes are normal and symmetric.    CMP Latest Ref Rng & Units 10/25/2020 07/11/2020 04/02/2020  Glucose 70 - 99 mg/dL 497(H) 337(H) 121(H)  BUN 6 - 20 mg/dL 11 9 8  Creatinine 0.44 - 1.00 mg/dL 0.64 0.63 0.52(L)  Sodium 135 - 145 mmol/L 131(L) 136 139  Potassium 3.5 - 5.1 mmol/L 3.8 4.4 3.9  Chloride 98 - 111 mmol/L 97(L) 98 102  CO2 22 - 32 mmol/L 25 22 22  Calcium 8.9 - 10.3 mg/dL 8.9 9.4 9.5  Total Protein 6.0 - 8.5 g/dL - 7.3 -  Total Bilirubin 0.0 - 1.2 mg/dL - 0.4 -  Alkaline Phos 44 - 121 IU/L - 122(H) -  AST 0 - 40 IU/L - 19 -  ALT 0 - 32 IU/L - 23 -    Lipid Panel     Component Value Date/Time   CHOL 151 07/31/2019 1004   TRIG 287 (H) 07/31/2019 1004   HDL 27 (L) 07/31/2019 1004   CHOLHDL 5.6 (H) 07/31/2019 1004   CHOLHDL 5.1 (H) 07/05/2016 0947   VLDL UNABLE TO CALCULATE IF TRIGLYCERIDE OVER 400 mg/dL 05/24/2016 0415   LDLCALC 77 07/31/2019 1004    CBC    Component Value Date/Time   WBC 12.5 (H) 10/25/2020 1722   RBC 5.29 (H) 10/25/2020 1722   HGB 15.2 (H) 10/25/2020 1722   HCT 46.4 (H) 10/25/2020 1722   PLT 215 10/25/2020 1722   MCV 87.7 10/25/2020 1722   MCH 28.7 10/25/2020 1722   MCHC 32.8 10/25/2020 1722   RDW 12.3 10/25/2020 1722   LYMPHSABS 2.6 04/01/2016 2144   MONOABS 0.4 04/01/2016 2144   EOSABS 0.3 04/01/2016 2144   BASOSABS 0.0 04/01/2016 2144    Lab Results  Component Value Date   HGBA1C 11.9 (A) 12/19/2020    Assessment & Plan:  1. Annual physical exam Counseled on 150 minutes of exercise per week, healthy eating (including decreased daily intake  of saturated fats, cholesterol, added sugars, sodium), routine healthcare maintenance. - Flu Vaccine QUAD 6mo+IM (Fluarix, Fluzone & Alfiuria Quad PF)  2. Screening for cervical cancer - Cytology - PAP([place])  3. Encounter for screening mammogram for malignant neoplasm of breast - MM 3D SCREEN BREAST BILATERAL; Future  4. Screening for colon cancer Chronic diarrhea with presence of hemorrhoids She is currently under GI care but has been advised to reschedule her appointment with general surgery for management of her hemorrhoids - Ambulatory referral to Gastroenterology  5. Other fatigue We will check thyroid level and vitamin D - T4, free - TSH - VITAMIN D 25 Hydroxy (Vit-D Deficiency, Fractures)  6. Weight loss Currently on a GLP-1 which could account for weight loss We will also perform thyroid labs   to exclude etiologies Will also need to exclude malignancy - T4, free - TSH  7. Furunculosis Would love to place on clindamycin because she is allergic  Placed on Keflex - cephALEXin (KEFLEX) 500 MG capsule; Take 1 capsule (500 mg total) by mouth 4 (four) times daily.  Dispense: 14 capsule; Refill: 0  8. Smoking greater than 20 pack years Spent 3 minutes counseling on smoking cessation but she is not ready to quit yet - CT CHEST LUNG CA SCREEN LOW DOSE W/O CM; Future  9. Type 2 diabetes mellitus with other neurologic complication, with long-term current use of insulin (HCC) Hyperglycemia could explain urinary frequency Management per endocrine - Microalbumin / creatinine urine ratio  10. Need for pneumococcal vaccine - Pneumococcal conjugate vaccine 20-valent    No orders of the defined types were placed in this encounter.   Follow-up: No follow-ups on file.       Charlott Rakes, MD, FAAFP. [facility] and [place], [place]   03/09/2021, 3:29 PM

## 2021-03-09 NOTE — Patient Instructions (Signed)
Health Maintenance, Female Adopting a healthy lifestyle and getting preventive care are important in promoting health and wellness. Ask your health care provider about: The right schedule for you to have regular tests and exams. Things you can do on your own to prevent diseases and keep yourself healthy. What should I know about diet, weight, and exercise? Eat a healthy diet  Eat a diet that includes plenty of vegetables, fruits, low-fat dairy products, and lean protein. Do not eat a lot of foods that are high in solid fats, added sugars, or sodium. Maintain a healthy weight Body mass index (BMI) is used to identify weight problems. It estimates body fat based on height and weight. Your health care provider can help determine your BMI and help you achieve or maintain a healthy weight. Get regular exercise Get regular exercise. This is one of the most important things you can do for your health. Most adults should: Exercise for at least 150 minutes each week. The exercise should increase your heart rate and make you sweat (moderate-intensity exercise). Do strengthening exercises at least twice a week. This is in addition to the moderate-intensity exercise. Spend less time sitting. Even light physical activity can be beneficial. Watch cholesterol and blood lipids Have your blood tested for lipids and cholesterol at 59 years of age, then have this test every 5 years. Have your cholesterol levels checked more often if: Your lipid or cholesterol levels are high. You are older than 59 years of age. You are at high risk for heart disease. What should I know about cancer screening? Depending on your health history and family history, you may need to have cancer screening at various ages. This may include screening for: Breast cancer. Cervical cancer. Colorectal cancer. Skin cancer. Lung cancer. What should I know about heart disease, diabetes, and high blood pressure? Blood pressure and heart  disease High blood pressure causes heart disease and increases the risk of stroke. This is more likely to develop in people who have high blood pressure readings, are of African descent, or are overweight. Have your blood pressure checked: Every 3-5 years if you are 18-39 years of age. Every year if you are 40 years old or older. Diabetes Have regular diabetes screenings. This checks your fasting blood sugar level. Have the screening done: Once every three years after age 40 if you are at a normal weight and have a low risk for diabetes. More often and at a younger age if you are overweight or have a high risk for diabetes. What should I know about preventing infection? Hepatitis B If you have a higher risk for hepatitis B, you should be screened for this virus. Talk with your health care provider to find out if you are at risk for hepatitis B infection. Hepatitis C Testing is recommended for: Everyone born from 1945 through 1965. Anyone with known risk factors for hepatitis C. Sexually transmitted infections (STIs) Get screened for STIs, including gonorrhea and chlamydia, if: You are sexually active and are younger than 59 years of age. You are older than 59 years of age and your health care provider tells you that you are at risk for this type of infection. Your sexual activity has changed since you were last screened, and you are at increased risk for chlamydia or gonorrhea. Ask your health care provider if you are at risk. Ask your health care provider about whether you are at high risk for HIV. Your health care provider may recommend a prescription medicine   to help prevent HIV infection. If you choose to take medicine to prevent HIV, you should first get tested for HIV. You should then be tested every 3 months for as long as you are taking the medicine. Pregnancy If you are about to stop having your period (premenopausal) and you may become pregnant, seek counseling before you get  pregnant. Take 400 to 800 micrograms (mcg) of folic acid every day if you become pregnant. Ask for birth control (contraception) if you want to prevent pregnancy. Osteoporosis and menopause Osteoporosis is a disease in which the bones lose minerals and strength with aging. This can result in bone fractures. If you are 65 years old or older, or if you are at risk for osteoporosis and fractures, ask your health care provider if you should: Be screened for bone loss. Take a calcium or vitamin D supplement to lower your risk of fractures. Be given hormone replacement therapy (HRT) to treat symptoms of menopause. Follow these instructions at home: Lifestyle Do not use any products that contain nicotine or tobacco, such as cigarettes, e-cigarettes, and chewing tobacco. If you need help quitting, ask your health care provider. Do not use street drugs. Do not share needles. Ask your health care provider for help if you need support or information about quitting drugs. Alcohol use Do not drink alcohol if: Your health care provider tells you not to drink. You are pregnant, may be pregnant, or are planning to become pregnant. If you drink alcohol: Limit how much you use to 0-1 drink a day. Limit intake if you are breastfeeding. Be aware of how much alcohol is in your drink. In the [place] one drink equals one 12 oz bottle of beer (355 mL), one 5 oz glass of wine (148 mL), or one 1 oz glass of hard liquor (44 mL). General instructions Schedule regular health, dental, and eye exams. Stay current with your vaccines. Tell your health care provider if: You often feel depressed. You have ever been abused or do not feel safe at home. Summary Adopting a healthy lifestyle and getting preventive care are important in promoting health and wellness. Follow your health care provider's instructions about healthy diet, exercising, and getting tested or screened for diseases. Follow your health care provider's  instructions on monitoring your cholesterol and blood pressure. This information is not intended to replace advice given to you by your health care provider. Make sure you discuss any questions you have with your health care provider. Document Revised: 08/22/2020 Document Reviewed: 06/07/2018 Elsevier Patient Education  2022 [employer] Inc.  

## 2021-03-09 NOTE — Progress Notes (Signed)
Patient says she feels fatigue

## 2021-03-10 LAB — MICROALBUMIN / CREATININE URINE RATIO
Creatinine, Urine: 21.7 mg/dL
Microalb/Creat Ratio: 21 mg/g creat (ref 0–29)
Microalbumin, Urine: 4.6 ug/mL

## 2021-03-10 LAB — T4, FREE: Free T4: 1.22 ng/dL (ref 0.82–1.77)

## 2021-03-10 LAB — TSH: TSH: 0.997 u[IU]/mL (ref 0.450–4.500)

## 2021-03-10 LAB — VITAMIN D 25 HYDROXY (VIT D DEFICIENCY, FRACTURES): Vit D, 25-Hydroxy: 22.3 ng/mL — ABNORMAL LOW (ref 30.0–100.0)

## 2021-03-11 ENCOUNTER — Other Ambulatory Visit: Payer: Self-pay | Admitting: Family Medicine

## 2021-03-11 DIAGNOSIS — E559 Vitamin D deficiency, unspecified: Secondary | ICD-10-CM

## 2021-03-11 MED ORDER — ERGOCALCIFEROL 1.25 MG (50000 UT) PO CAPS: 50000.0000 [IU] | ORAL_CAPSULE | ORAL | 1 refills | Status: DC

## 2021-03-13 ENCOUNTER — Telehealth: Payer: Self-pay

## 2021-03-13 MED ORDER — FLUCONAZOLE 150 MG PO TABS: 150.0000 mg | ORAL_TABLET | Freq: Once | ORAL | 0 refills | Status: AC

## 2021-03-13 NOTE — Telephone Encounter (Signed)
Requesting Diflucan pill due to yeast infection after taking antibiotic.  Requesting cream for rash on face.

## 2021-03-13 NOTE — Telephone Encounter (Signed)
-----   Message from Hoy Register, MD sent at 03/11/2021  8:43 AM EDT ----- Please inform her that her thyroid labs are normal, so is her kidney function.  Vitamin D is low and this could explain fatigue.  I have sent a prescription for replacement to her pharmacy and she would remain on this for a course of 3 months after which she would need a repeat vitamin D level.

## 2021-03-13 NOTE — Telephone Encounter (Signed)
Patient name and DOB has been verified Patient was informed of lab results. Patient had no questions.  

## 2021-03-13 NOTE — Telephone Encounter (Signed)
Done

## 2021-03-16 ENCOUNTER — Ambulatory Visit (HOSPITAL_COMMUNITY): Payer: Medicaid Other

## 2021-03-16 ENCOUNTER — Encounter: Payer: Self-pay | Admitting: Gastroenterology

## 2021-03-16 LAB — CYTOLOGY - PAP
Comment: NEGATIVE
Diagnosis: UNDETERMINED — AB
High risk HPV: NEGATIVE

## 2021-03-20 ENCOUNTER — Ambulatory Visit: Payer: Medicaid Other | Admitting: Internal Medicine

## 2021-03-20 NOTE — Progress Notes (Deleted)
Name: [name]  Age/ Sex: 60 y.o., female   MRN/ DOB: [id number], March 19, 1961     PCP: Charlott Rakes, MD   Reason for Endocrinology Evaluation: Type 2 Diabetes Mellitus  Initial Endocrine Consultative Visit: 12/19/2020    PATIENT IDENTIFIER: [name] is a 60 y.o. female with a past medical history of T2DM, HTN, COPD  and CHF. The patient has followed with Endocrinology clinic since 12/19/2020 for consultative assistance with management of her diabetes.  DIABETIC HISTORY:  [name] was diagnosed with DM 2017, Metformin - diarrhea . Her hemoglobin A1c has ranged from 9.0% in 2018, peaking at 13.4% in 2022   On her initial visit to our clinic she had an A1c of  11.9%  , she was on Novolog mix and Glimepiride which we stopped, started trulicity and basal insulin   SUBJECTIVE:   During the last visit (12/19/2020): A1c 11.9% Stopped Novolog mix and Glimepiride. Started basal insulin and Trulicity   Today (2/87/6811): [name]  is here for a follow up on diabetes management. She checks her blood sugars *** times daily, preprandial to breakfast and ***. The patient has *** had hypoglycemic episodes since the last clinic visit, which typically occur *** x / - most often occuring ***. The patient is *** symptomatic with these episodes, with symptoms of {symptoms; hypoglycemia:9084048}.   HOME DIABETES REGIMEN:  Lantus ( long acting insulin ) 18 units once daily  Trulicity 5.72 mg ONCE WEEKLY     Statin: yes ACE-I/ARB: Allergic to lisinopril- swelling     [name] DOWNLOAD SUMMARY: Date range evaluated: *** Fingerstick Blood Glucose Tests = *** Average Number Tests/Day = *** Overall Mean FS Glucose = *** Standard Deviation = ***  BG Ranges: Low = *** High = ***   Hypoglycemic Events/30 Days: BG < 50 = *** Episodes of symptomatic severe hypoglycemia = ***    DIABETIC COMPLICATIONS: Microvascular complications:  Neuropathy Denies: CKD,  retinopathy Last Eye Exam: Completed 12/2020  Macrovascular complications:  CHF Denies: CAD, CVA, PVD   HISTORY:  Past Medical History:  Past Medical History:  Diagnosis Date   Anxiety    Arthritis    Chronic systolic heart failure (HCC)    EF 20-25% ECHO 05/2015   COPD (chronic obstructive pulmonary disease) ([place])    Depression    Diabetes mellitus without complication ([place])    Type II   Dysrhythmia    pt unsure of name of arrythmia - " my heart rate will drop all of a sudden" -no current treatment - atrial flutter   Fibrillation, atrial (HCC)    Gallstones    GERD (gastroesophageal reflux disease)    Headache(784.0)    migraines   Hypertension    Osteomyelitis (HCC)    R ankle   Rash    arms   Past Surgical History:  Past Surgical History:  Procedure Laterality Date   ANKLE FRACTURE SURGERY Right 2015   CARDIAC CATHETERIZATION N/A 06/24/2015   Procedure: Left Heart Cath and Coronary Angiography;  Surgeon: Belva Crome, MD;  Location: [facility];  Service: Cardiovascular;  Laterality: N/A;   CARPAL TUNNEL RELEASE     bil   CHOLECYSTECTOMY  11/24/2011   Procedure: LAPAROSCOPIC CHOLECYSTECTOMY WITH INTRAOPERATIVE CHOLANGIOGRAM;  Surgeon: Joyice Faster. Cornett, MD;  Location[facility];  Service: General;  Laterality: N/A;  Laparoscopic Cholecystectomy with Cholangiogram   COLONOSCOPY     ECTOPIC PREGNANCY SURGERY  many yrs ago   HARDWARE REMOVAL Right  07/15/2015   Procedure: RIGHT ANKLE HARDWARE REMOVAL, PLACEMENT OF STIMULAN ANTIBIOTIC BEADS AND PREVENA WOUND VAC.;  Surgeon: Meredith Pel, MD;  Location: [facility];  Service: Orthopedics;  Laterality: Right;   TOOTH EXTRACTION N/A 12/03/2016   Procedure: DENTAL EXTRACTIONS with Alveoloplasty;  Surgeon: Diona Browner, DDS;  Location: [facility];  Service: Oral Surgery;  Laterality: N/A;   Social History:  reports that she has been smoking cigarettes and e-cigarettes. She started smoking about 4 years ago. She has a 400.00  pack-year smoking history. She has never used smokeless tobacco. She reports that she does not drink alcohol and does not use drugs. Family History:  Family History  Problem Relation Age of Onset   Diabetes Mother    Lung cancer Father    Heart attack Father 56       CABG x4   Breast cancer Sister        Survivor      HOME MEDICATIONS: Allergies as of 03/20/2021       Reactions   Biaxin [clarithromycin] Anaphylaxis, Swelling   Clarithromycin Shortness Of Breath, Swelling   Lisinopril Swelling, Cough   Lip edema   Sulfa Antibiotics Anaphylaxis, Shortness Of Breath, Swelling, Hypertension   Chlorthalidone Nausea And Vomiting, Other (See Comments)   Clammy, Tachycardia, Headache   Daptomycin Nausea And Vomiting   Latex Hives, Rash   Amoxicillin-pot Clavulanate Diarrhea   Has patient had a PCN reaction causing immediate rash, facial/tongue/throat swelling, SOB or lightheadedness with hypotension:No Has patient had a PCN reaction causing severe rash involving mucus membranes or skin necrosis:No Has patient had a PCN reaction that required hospitalization:No Has patient had a PCN reaction occurring within THE LAST 10 YEARS.  #  #  #  YES  #  #  #  If all of the above answers are "NO", then may proceed with Cephalosporin use.   Aspirin Nausea And Vomiting, Rash   On $R'325mg'Hv$  dosage   Cholestyramine Nausea And Vomiting   Dilaudid [hydromorphone Hcl] Nausea And Vomiting   Lasix [furosemide] Nausea And Vomiting, Other (See Comments)   Headache         Medication List        Accurate as of March 20, 2021  7:11 AM. If you have any questions, ask your nurse or doctor.          Accu-Chek Guide Me w/Device Kit Use to check blood sugar TID.   Accu-Chek Guide test strip Generic drug: glucose blood Use to check blood sugar TID.   accu-chek softclix lancets Use as instructed daily.   Accu-Chek Softclix Lancets lancets Use to check blood sugar TID.   albuterol 108 (90  Base) MCG/ACT inhaler Commonly known as: VENTOLIN HFA Inhale 2 puffs into the lungs every 4 (four) hours as needed for wheezing or shortness of breath.   benzonatate 200 MG capsule Commonly known as: TESSALON Take 1 capsule (200 mg total) by mouth every 8 (eight) hours.   cephALEXin 500 MG capsule Commonly known as: KEFLEX Take 1 capsule (500 mg total) by mouth 4 (four) times daily.   dicyclomine 10 MG capsule Commonly known as: BENTYL Take 1 capsule (10 mg total) by mouth 3 (three) times daily before meals.   diphenoxylate-atropine 2.5-0.025 MG tablet Commonly known as: LOMOTIL Take 1 tablet by mouth 4 (four) times daily as needed for diarrhea or loose stools.   ergocalciferol 1.25 MG (50000 UT) capsule Commonly known as: Drisdol Take 1 capsule (50,000 Units total) by mouth  once a week.   esomeprazole 20 MG capsule Commonly known as: NEXIUM Take 1 capsule (20 mg total) by mouth daily.   FLUoxetine 40 MG capsule Commonly known as: PROZAC Take 1 capsule (40 mg total) by mouth at bedtime.   gabapentin 300 MG capsule Commonly known as: NEURONTIN Take 2 capsules (600 mg total) by mouth 2 (two) times daily.   hydrOXYzine 10 MG tablet Commonly known as: ATARAX/VISTARIL Take 1 tablet (10 mg total) by mouth 3 (three) times daily as needed.   Insulin Pen Needle 32G X 4 MM Misc 1 Device by Does not apply route daily.   Lantus SoloStar 100 UNIT/ML Solostar Pen Generic drug: insulin glargine Inject 18 Units into the skin daily.   meclizine 25 MG tablet Commonly known as: ANTIVERT Take 1 tablet (25 mg total) by mouth 3 (three) times daily as needed for dizziness.   metoprolol succinate 25 MG 24 hr tablet Commonly known as: TOPROL-XL Take 1 tablet (25 mg total) by mouth daily.   NovoLOG FlexPen 100 UNIT/ML FlexPen Generic drug: insulin aspart Inject 6 Units into the skin 3 (three) times daily with meals.   rosuvastatin 10 MG tablet Commonly known as: Crestor Take 1  tablet (10 mg total) by mouth daily.   tiotropium 18 MCG inhalation capsule Commonly known as: SPIRIVA Place 1 capsule (18 mcg total) into inhaler and inhale daily.   Trulicity 1.70 YF/7.4BS Sopn Generic drug: Dulaglutide Inject 0.75 mg into the skin once a week.         OBJECTIVE:   Vital Signs: LMP 07/24/2011   Wt Readings from Last 3 Encounters:  03/09/21 141 lb 9.6 oz (64.2 kg)  01/28/21 146 lb 12.8 oz (66.6 kg)  12/19/20 152 lb 6.4 oz (69.1 kg)     Exam: General: Pt appears well and is in NAD  Lungs: Clear with good BS bilat with no rales, rhonchi, or wheezes  Heart: RRR with normal S1 and S2 and no gallops; no murmurs; no rub  Abdomen: Normoactive bowel sounds, soft, nontender, without masses or organomegaly palpable  Extremities: No pretibial edema.   Neuro: MS is good with appropriate affect, pt is alert and Ox3    DM foot exam: Please see diabetic assessment flow-sheet detailed below:           DATA REVIEWED:  Lab Results  Component Value Date   HGBA1C 11.9 (A) 12/19/2020   HGBA1C 13.4 (A) 07/15/2020   HGBA1C 11.6 (A) 04/02/2020   Lab Results  Component Value Date   LDLCALC 77 07/31/2019   CREATININE 0.64 10/25/2020   Lab Results  Component Value Date   MICRALBCREAT 21 03/09/2021     Lab Results  Component Value Date   CHOL 151 07/31/2019   HDL 27 (L) 07/31/2019   LDLCALC 77 07/31/2019   TRIG 287 (H) 07/31/2019   CHOLHDL 5.6 (H) 07/31/2019         ASSESSMENT / PLAN / RECOMMENDATIONS:   1) Type 2 Diabetes Mellitus, ***controlled, With Neuropathic complications - Most recent A1c of *** %. Goal A1c < 7.0 %.    Plan: MEDICATIONS: ***  EDUCATION / INSTRUCTIONS: BG monitoring instructions: Patient is instructed to check her blood sugars *** times a day, ***. Call [facility] if: BG persistently < 70 I reviewed the Rule of 15 for the treatment of hypoglycemia in detail with the patient. Literature supplied.   2)  Diabetic complications:  Eye: Does not have known diabetic retinopathy.  Neuro/ Feet:  Does have known diabetic peripheral neuropathy .  Renal: Patient does not have known baseline CKD. She   is not on an ACEI/ARB at present.   3) Lipids: Patient is *** on a statin.    F/U in ***    Signed electronically by: Mack Guise, MD  [facility]  [address] [place], [place] [address] Phone: [phone] FAX: [phone]   CC: Charlott Rakes, [place] [address] Phone: [phone]  Fax: [phone]  Return to Endocrinology clinic as below: Future Appointments  Date Time Provider [place]  03/20/2021 10:30 AM Shandel Busic, Melanie Crazier, MD [facility] None  03/23/2021 12:30 PM [facility] 2 [facility] [place]  04/09/2021  4:20 PM [facility] MM 2 GI-BCGMM [facility]  04/14/2021  2:10 PM Daryel November, MD [facility]  06/09/2021  1:30 PM Charlott Rakes, MD [facility] None

## 2021-03-23 ENCOUNTER — Encounter (HOSPITAL_COMMUNITY): Payer: Self-pay

## 2021-03-23 ENCOUNTER — Ambulatory Visit (HOSPITAL_COMMUNITY)
Admission: RE | Admit: 2021-03-23 | Discharge: 2021-03-23 | Disposition: A | Discharge status: Home / Self Care | Payer: Medicaid Other | Source: Ambulatory Visit | Attending: Family Medicine | Admitting: Family Medicine

## 2021-03-23 ENCOUNTER — Other Ambulatory Visit: Payer: Self-pay

## 2021-03-23 DIAGNOSIS — F17210 Nicotine dependence, cigarettes, uncomplicated: Secondary | ICD-10-CM | POA: Diagnosis present

## 2021-03-26 ENCOUNTER — Telehealth: Payer: Self-pay | Admitting: Family Medicine

## 2021-03-26 NOTE — Telephone Encounter (Signed)
Patient is inquiring about his CT Results and would like a follow up call.

## 2021-03-26 NOTE — Telephone Encounter (Signed)
Pt was called and informed of CT scan results. 

## 2021-03-27 ENCOUNTER — Telehealth: Payer: Self-pay | Admitting: Cardiovascular Disease

## 2021-03-27 ENCOUNTER — Other Ambulatory Visit: Payer: Self-pay | Admitting: Family Medicine

## 2021-03-27 DIAGNOSIS — E1149 Type 2 diabetes mellitus with other diabetic neurological complication: Secondary | ICD-10-CM

## 2021-03-27 DIAGNOSIS — Z794 Long term (current) use of insulin: Secondary | ICD-10-CM

## 2021-03-27 MED ORDER — ROSUVASTATIN CALCIUM 10 MG PO TABS
10.0000 mg | ORAL_TABLET | Freq: Every day | ORAL | 1 refills | Status: DC
Start: 2021-03-27 — End: 2021-06-09

## 2021-03-27 NOTE — Telephone Encounter (Unsigned)
Copied from CRM [id number]. Topic: Quick Communication - Rx Refill/Question >> Mar 27, 2021  3:01 PM [name], [name] wrote: Medication: albuterol (VENTOLIN HFA) 108 (90 Base) MCG/ACT inhaler [[id number]]   rosuvastatin (CRESTOR) 10 MG tablet [[id number]]   Has the patient contacted their pharmacy? Yes.   (Agent: If no, request that the patient contact the pharmacy for the refill.) (Agent: If yes, when and what did the pharmacy advise?)  Preferred Pharmacy (with phone number or street name): [facility] - [place], [place] - [address]  Phone:  [phone] Fax:  [phone]  Has the patient been seen for an appointment in the last year OR does the patient have an upcoming appointment? Yes.    Agent: Please be advised that RX refills may take up to 3 business days. We ask that you follow-up with your pharmacy.

## 2021-03-27 NOTE — Telephone Encounter (Signed)
*  STAT* If patient is at the pharmacy, call can be transferred to refill team.   1. Which medications need to be refilled? (please list name of each medication and dose if known) Fenofibrate 48 mg  2. Which pharmacy/location (including street and city if local pharmacy) is medication to be sent to? [facility] - [place], [place] - [address]  3. Do they need a 30 day or 90 day supply? 90 day supply

## 2021-03-27 NOTE — Telephone Encounter (Signed)
Requested medications are due for refill today NO  Requested medications are on the active medication list NO  Last refill 08/09/20  Last visit 03/09/21  Future visit scheduled no  Notes to clinic This med is not on the current med list.

## 2021-03-30 NOTE — Telephone Encounter (Signed)
Called pt to inform her that the medication fenofibrate was D/C by Dr. Alvis Lemmings and that he is the one that has been prescribing this medication and that she needed to contact his office concerning this medication. I advised the pt that if she has any other problems, questions or concerns, to give our office a call back. Pt verbalized understanding.

## 2021-03-31 ENCOUNTER — Encounter: Payer: Medicaid Other | Admitting: Cardiovascular Disease

## 2021-03-31 ENCOUNTER — Encounter: Payer: Self-pay | Admitting: Cardiovascular Disease

## 2021-03-31 NOTE — Progress Notes (Signed)
This encounter was created in error - please disregard.

## 2021-04-07 ENCOUNTER — Other Ambulatory Visit: Payer: Self-pay | Admitting: Family Medicine

## 2021-04-07 DIAGNOSIS — Z794 Long term (current) use of insulin: Secondary | ICD-10-CM

## 2021-04-07 DIAGNOSIS — E1149 Type 2 diabetes mellitus with other diabetic neurological complication: Secondary | ICD-10-CM

## 2021-04-07 NOTE — Telephone Encounter (Signed)
Requested Prescriptions  Pending Prescriptions Disp Refills  . glucose blood (ACCU-CHEK GUIDE) test strip [Pharmacy Med Name: Accu-Chek Guide test strips] 100 strip 2    Sig: Use to check blood sugar THREE TIMES DAILY     Endocrinology: Diabetes - Testing Supplies Passed - 04/07/2021 10:27 AM      Passed - Valid encounter within last 12 months    Recent Outpatient Visits          4 weeks ago Annual physical exam   [facility] Hoy Register, MD   2 months ago Trigger ring finger of right hand   [facility] [place], [place], MD   1 year ago Type 2 diabetes mellitus with hyperglycemia, with long-term current use of insulin (HCC)   [facility] [place], [place], MD   1 year ago Type 2 diabetes mellitus with hyperglycemia, with long-term current use of insulin (HCC)   [place] [place], [place], MD   1 year ago Type 2 diabetes mellitus with hyperglycemia, with long-term current use of insulin (HCC)   [place] Hoy Register, MD      Future Appointments            In 2 months Hoy Register, MD [facility]   In 2 months Nahser, Deloris Ping, MD [facility] [address] Office, [facility]           . Accu-Chek Softclix Lancets lancets [facility] Med Name: Accu-Chek Softclix Lancets] 100 each 2    Sig: Use to check blood sugar THREE TIMES DAILY     Endocrinology: Diabetes - Testing Supplies Passed - 04/07/2021 10:27 AM      Passed - Valid encounter within last 12 months    Recent Outpatient Visits          4 weeks ago Annual physical exam   [facility] Hoy Register, MD   2 months ago Trigger ring finger of right hand   [place] [facility] [place], Downing, MD   1 year ago Type 2 diabetes mellitus with hyperglycemia, with long-term current use of  insulin (HCC)   [place] [place], [place], MD   1 year ago Type 2 diabetes mellitus with hyperglycemia, with long-term current use of insulin (HCC)   [facility] [place], [place], MD   1 year ago Type 2 diabetes mellitus with hyperglycemia, with long-term current use of insulin [facility])   [place] [facility] Hoy Register, MD      Future Appointments            In 2 months Hoy Register, MD [facility]   In 2 months Nahser, Deloris Ping, MD [facility] Office, [facility]

## 2021-04-09 ENCOUNTER — Ambulatory Visit: Payer: Medicaid Other

## 2021-04-14 ENCOUNTER — Ambulatory Visit: Payer: Medicaid Other | Admitting: Gastroenterology

## 2021-04-14 ENCOUNTER — Other Ambulatory Visit: Payer: Self-pay | Admitting: Family Medicine

## 2021-04-14 DIAGNOSIS — K580 Irritable bowel syndrome with diarrhea: Secondary | ICD-10-CM

## 2021-04-14 NOTE — Telephone Encounter (Signed)
Requested Prescriptions  Pending Prescriptions Disp Refills  . dicyclomine (BENTYL) 10 MG capsule [Pharmacy Med Name: dicyclomine 10 mg capsule] 270 capsule 1    Sig: Take 1 capsule (10 mg total) by mouth 3 (three) times daily before meals.     Gastroenterology:  Antispasmodic Agents Failed - 04/14/2021  2:38 PM      Failed - Last Heart Rate in normal range    Pulse Readings from Last 1 Encounters:  03/09/21 (!) 115         Passed - Valid encounter within last 12 months    Recent Outpatient Visits          1 month ago Annual physical exam   [facility] [place], [place], MD   2 months ago Trigger ring finger of right hand   [place] [facility] [place], [place], MD   1 year ago Type 2 diabetes mellitus with hyperglycemia, with long-term current use of insulin (HCC)   [facility] [place], [place], MD   1 year ago Type 2 diabetes mellitus with hyperglycemia, with long-term current use of insulin (HCC)   [facility] [facility] [place], [place], MD   1 year ago Type 2 diabetes mellitus with hyperglycemia, with long-term current use of insulin (HCC)    [facility] Hoy [facility], MD      Future Appointments            In 1 month [facility], [facility]   In 2 months Nahser, Deloris Ping, MD [facility] Office, [facility]

## 2021-04-22 ENCOUNTER — Other Ambulatory Visit: Payer: Self-pay | Admitting: Internal Medicine

## 2021-05-05 ENCOUNTER — Encounter: Payer: Self-pay | Admitting: Family Medicine

## 2021-05-10 ENCOUNTER — Encounter: Payer: Self-pay | Admitting: Cardiovascular Disease

## 2021-05-10 NOTE — Progress Notes (Signed)
This encounter was created in error - please disregard.

## 2021-05-11 ENCOUNTER — Encounter: Payer: Medicaid Other | Admitting: Cardiovascular Disease

## 2021-05-12 ENCOUNTER — Ambulatory Visit: Payer: Medicaid Other

## 2021-05-18 ENCOUNTER — Other Ambulatory Visit: Payer: Self-pay | Admitting: Family Medicine

## 2021-05-18 DIAGNOSIS — H8113 Benign paroxysmal vertigo, bilateral: Secondary | ICD-10-CM

## 2021-05-18 DIAGNOSIS — F419 Anxiety disorder, unspecified: Secondary | ICD-10-CM

## 2021-05-18 DIAGNOSIS — K580 Irritable bowel syndrome with diarrhea: Secondary | ICD-10-CM

## 2021-05-18 NOTE — Telephone Encounter (Signed)
Requested medications are due for refill today.  Yes - both  Requested medications are on the active medications list.  Yes - both  Last refill. 09/04/2020 and 02/19/2021  Future visit scheduled.   yes  Notes to clinic.  Medications are not delegated.

## 2021-05-18 NOTE — Telephone Encounter (Signed)
Requested Prescriptions  Pending Prescriptions Disp Refills  . meclizine (ANTIVERT) 25 MG tablet [Pharmacy Med Name: meclizine 25 mg tablet] 180 tablet 1    Sig: Take 1 tablet (25 mg total) by mouth 3 (three) times daily as needed for dizziness.     Not Delegated - Gastroenterology: Antiemetics Failed - 05/18/2021  3:10 PM      Failed - This refill cannot be delegated      Passed - Valid encounter within last 6 months    Recent Outpatient Visits          2 months ago Annual physical exam   [facility] Hoy Register, MD   3 months ago Trigger ring finger of right hand   [place] [facility] [place], Odette Horns, MD   1 year ago Type 2 diabetes mellitus with hyperglycemia, with long-term current use of insulin (HCC)   [place] [facility] [place], Odette Horns, MD   1 year ago Type 2 diabetes mellitus with hyperglycemia, with long-term current use of insulin (HCC)   [place] [facility] [place], Odette Horns, MD   1 year ago Type 2 diabetes mellitus with hyperglycemia, with long-term current use of insulin (HCC)   [place] [facility] Hoy Register, MD      Future Appointments            In 3 weeks Hoy Register, MD [facility]           . hydrOXYzine (ATARAX/VISTARIL) 10 MG tablet [Pharmacy Med Name: hydroxyzine HCl 10 mg tablet] 60 tablet 1    Sig: Take 1 tablet (10 mg total) by mouth 3 (three) times daily as needed.     Ear, Nose, and Throat:  Antihistamines Passed - 05/18/2021  3:10 PM      Passed - Valid encounter within last 12 months    Recent Outpatient Visits          2 months ago Annual physical exam   [place] [facility] Hoy Register, MD   3 months ago Trigger ring finger of right hand   [place] [facility] [place], Odette Horns, MD   1 year ago Type 2 diabetes mellitus with hyperglycemia,  with long-term current use of insulin (HCC)   [place] [facility] [place], Odette Horns, MD   1 year ago Type 2 diabetes mellitus with hyperglycemia, with long-term current use of insulin (HCC)   [place] [facility] [place], Odette Horns, MD   1 year ago Type 2 diabetes mellitus with hyperglycemia, with long-term current use of insulin (HCC)   [place] [facility] Hoy Register, MD      Future Appointments            In 3 weeks Hoy Register, MD [facility]           . diphenoxylate-atropine (LOMOTIL) 2.5-0.025 MG tablet [Pharmacy Med Name: diphenoxylate-atropine 2.5 mg-0.025 mg tablet] 60 tablet 1    Sig: Take 1 tablet by mouth 4 (four) times daily as needed for diarrhea or loose stools.     Not Delegated - Gastroenterology:  Antidiarrheals Failed - 05/18/2021  3:10 PM      Failed - This refill cannot be delegated      Passed - Valid encounter within last 12 months    Recent Outpatient Visits  2 months ago Annual physical exam   [place] [facility] [place], Odette Horns, MD   3 months ago Trigger ring finger of right hand   [place] [facility] [place], [place], MD   1 year ago Type 2 diabetes mellitus with hyperglycemia, with long-term current use of insulin (HCC)   [place] [facility] [place], Odette Horns, MD   1 year ago Type 2 diabetes mellitus with hyperglycemia, with long-term current use of insulin (HCC)   [place] [facility] [place], Odette Horns, MD   1 year ago Type 2 diabetes mellitus with hyperglycemia, with long-term current use of insulin (HCC)   [place] [facility] Hoy Register, MD      Future Appointments            In 3 weeks Hoy Register, MD [facility]

## 2021-05-20 ENCOUNTER — Ambulatory Visit: Payer: Self-pay | Admitting: *Deleted

## 2021-05-20 NOTE — Telephone Encounter (Signed)
I returned pt's call.  She had called in earlier today c/o having flu symptoms.   Her grandson stays with her and he tested positive for influenza A.   This morning the pt woke up with symptoms the same as his.   See triage notes.  I went over the home care advice with her and the s/s to go to the urgent care or ED if they develop since we are going into the Thanksgiving holiday weekend.  She verbalized understanding and was agreeable to this plan.   She's sending her family member to get needed medications from the store.   Her grandson is still with her.

## 2021-05-20 NOTE — Telephone Encounter (Signed)
Reason for Disposition  [1] Probable influenza (fever) with no complications AND [2] NOT HIGH RISK    Grandson tested positive for Influenza A and he stays with pt.   Now she is having the same symptoms.  Answer Assessment - Initial Assessment Questions 1. WORST SYMPTOM: "What is your worst symptom?" (e.g., cough, runny nose, muscle aches, headache, sore throat, fever)     My grandson has flu A.  Now I'm aching, coughing, shortness of breath, runny nose, fever and I hurt from my head to my toes.   I'm taking Coricedin.      I have high BP and diabetes.   I taking the Coricedin HBP Fever 100.1 No diarrhea.   No vomiting but nausea  Aching of my body and headache. 2. ONSET: "When did your flu symptoms start?"      This morning  3. COUGH: "How bad is the cough?"       No coughing up anything.   4. RESPIRATORY DISTRESS: "Describe your breathing."      No shortness of breath or wheezing. 5. FEVER: "Do you have a fever?" If Yes, ask: "What is your temperature, how was it measured, and when did it start?"     100.1 6. EXPOSURE: "Were you exposed to someone with influenza?"       My grandson 7. FLU VACCINE: "Did you get a flu shot this year?"     Yes in Sept.     8. HIGH RISK DISEASE: "Do you have any chronic medical problems?" (e.g., heart or lung disease, asthma, weak immune system, or other HIGH RISK conditions)     I COPD and emphysema.   I have A. Fib.   9. PREGNANCY: "Is there any chance you are pregnant?" "When was your last menstrual period?"     N/A 10. OTHER SYMPTOMS: "Do you have any other symptoms?"  (e.g., runny nose, muscle aches, headache, sore throat)       Runny nose, sore throat, muscle aches, non productive cough, fever 100.1 and just "feel like I've been run over by a truck".   "I hurt from my head to my toes".    No diarrhea but is having nausea.  Protocols used: Influenza - [facility]

## 2021-06-02 ENCOUNTER — Other Ambulatory Visit: Payer: Self-pay | Admitting: Family Medicine

## 2021-06-02 NOTE — Telephone Encounter (Signed)
Requested medications are due for refill today.  Unsure    Requested medications are on the active medications list.  yes  Last refill. 03/11/2021  Future visit scheduled.   yes  Notes to clinic.  Medication not delegated    Requested Prescriptions  Pending Prescriptions Disp Refills   Vitamin D, Ergocalciferol, (DRISDOL) 1.25 MG (50000 UNIT) CAPS capsule [Pharmacy Med Name: ergocalciferol (vitamin D2) 1,250 mcg (50,000 unit) capsule] 12 capsule 1    Sig: Take 1 capsule (50,000 Units total) by mouth once a week.     Endocrinology:  Vitamins - Vitamin D Supplementation Failed - 06/02/2021  3:52 PM      Failed - 50,000 IU strengths are not delegated      Failed - Phosphate in normal range and within 360 days    No results found for: PHOS        Failed - Vitamin D in normal range and within 360 days    Vit D, 25-Hydroxy  Date Value Ref Range Status  03/09/2021 22.3 (L) 30.0 - 100.0 ng/mL Final    Comment:    Vitamin D deficiency has been defined by the Institute of Medicine and an Endocrine Society practice guideline as a level of serum 25-OH vitamin D less than 20 ng/mL (1,2). The Endocrine Society went on to further define vitamin D insufficiency as a level between 21 and 29 ng/mL (2). 1. IOM (Institute of Medicine). 2010. Dietary reference    intakes for calcium and D. [place]: The    [employer]. 2. Holick MF, Binkley Etna, Bischoff-Ferrari HA, et al.    Evaluation, treatment, and prevention of vitamin D    deficiency: an Endocrine Society clinical practice    guideline. JCEM. 2011 Jul; 96(7):1911-30.           Passed - Ca in normal range and within 360 days    Calcium  Date Value Ref Range Status  10/25/2020 8.9 8.9 - 10.3 mg/dL Final   Calcium, Ion  Date Value Ref Range Status  04/05/2009 1.11 (L) 1.12 - 1.32 mmol/L Final          Passed - Valid encounter within last 12 months    Recent Outpatient Visits           2 months ago Annual  physical exam   [facility] [place], Odette Horns, MD   4 months ago Trigger ring finger of right hand   [facility] [place], [place], MD   1 year ago Type 2 diabetes mellitus with hyperglycemia, with long-term current use of insulin (HCC)   [facility] [place], [place], MD   1 year ago Type 2 diabetes mellitus with hyperglycemia, with long-term current use of insulin (HCC)   [facility] [place], Odette Horns, MD   1 year ago Type 2 diabetes mellitus with hyperglycemia, with long-term current use of insulin (HCC)   [facility] Hoy Register, MD       Future Appointments             In 1 week Hoy Register, MD [facility]

## 2021-06-09 ENCOUNTER — Other Ambulatory Visit: Payer: Self-pay

## 2021-06-09 ENCOUNTER — Emergency Department (HOSPITAL_COMMUNITY)
Admission: EM | Admit: 2021-06-09 | Discharge: 2021-06-09 | Disposition: A | Discharge status: Home / Self Care | Payer: Medicaid Other | Attending: Emergency Medicine | Admitting: Emergency Medicine

## 2021-06-09 ENCOUNTER — Encounter (HOSPITAL_COMMUNITY): Payer: Self-pay | Admitting: Emergency Medicine

## 2021-06-09 ENCOUNTER — Emergency Department (HOSPITAL_COMMUNITY): Payer: Medicaid Other

## 2021-06-09 ENCOUNTER — Ambulatory Visit: Payer: Medicaid Other | Attending: Family Medicine | Admitting: Family Medicine

## 2021-06-09 VITALS — BP 122/78 | HR 73 | Ht 67.0 in | Wt 129.6 lb

## 2021-06-09 DIAGNOSIS — Z79899 Other long term (current) drug therapy: Secondary | ICD-10-CM | POA: Insufficient documentation

## 2021-06-09 DIAGNOSIS — J40 Bronchitis, not specified as acute or chronic: Secondary | ICD-10-CM | POA: Insufficient documentation

## 2021-06-09 DIAGNOSIS — F17210 Nicotine dependence, cigarettes, uncomplicated: Secondary | ICD-10-CM | POA: Insufficient documentation

## 2021-06-09 DIAGNOSIS — R058 Other specified cough: Secondary | ICD-10-CM

## 2021-06-09 DIAGNOSIS — K580 Irritable bowel syndrome with diarrhea: Secondary | ICD-10-CM | POA: Diagnosis not present

## 2021-06-09 DIAGNOSIS — R739 Hyperglycemia, unspecified: Secondary | ICD-10-CM

## 2021-06-09 DIAGNOSIS — J449 Chronic obstructive pulmonary disease, unspecified: Secondary | ICD-10-CM | POA: Diagnosis not present

## 2021-06-09 DIAGNOSIS — Z20822 Contact with and (suspected) exposure to covid-19: Secondary | ICD-10-CM | POA: Diagnosis not present

## 2021-06-09 DIAGNOSIS — E1165 Type 2 diabetes mellitus with hyperglycemia: Secondary | ICD-10-CM | POA: Insufficient documentation

## 2021-06-09 DIAGNOSIS — Z794 Long term (current) use of insulin: Secondary | ICD-10-CM

## 2021-06-09 DIAGNOSIS — Z91040 Latex allergy status: Secondary | ICD-10-CM | POA: Diagnosis not present

## 2021-06-09 DIAGNOSIS — F419 Anxiety disorder, unspecified: Secondary | ICD-10-CM | POA: Diagnosis not present

## 2021-06-09 DIAGNOSIS — E1159 Type 2 diabetes mellitus with other circulatory complications: Secondary | ICD-10-CM | POA: Diagnosis not present

## 2021-06-09 DIAGNOSIS — I152 Hypertension secondary to endocrine disorders: Secondary | ICD-10-CM

## 2021-06-09 DIAGNOSIS — I5022 Chronic systolic (congestive) heart failure: Secondary | ICD-10-CM | POA: Insufficient documentation

## 2021-06-09 DIAGNOSIS — E1149 Type 2 diabetes mellitus with other diabetic neurological complication: Secondary | ICD-10-CM

## 2021-06-09 DIAGNOSIS — I110 Hypertensive heart disease with heart failure: Secondary | ICD-10-CM | POA: Insufficient documentation

## 2021-06-09 DIAGNOSIS — R059 Cough, unspecified: Secondary | ICD-10-CM | POA: Diagnosis present

## 2021-06-09 DIAGNOSIS — Z8719 Personal history of other diseases of the digestive system: Secondary | ICD-10-CM

## 2021-06-09 LAB — CBC
HCT: 48.2 % — ABNORMAL HIGH (ref 36.0–46.0)
Hemoglobin: 16.5 g/dL — ABNORMAL HIGH (ref 12.0–15.0)
MCH: 29.5 pg (ref 26.0–34.0)
MCHC: 34.2 g/dL (ref 30.0–36.0)
MCV: 86.1 fL (ref 80.0–100.0)
Platelets: 207 10*3/uL (ref 150–400)
RBC: 5.6 MIL/uL — ABNORMAL HIGH (ref 3.87–5.11)
RDW: 12.4 % (ref 11.5–15.5)
WBC: 10.3 10*3/uL (ref 4.0–10.5)
nRBC: 0 % (ref 0.0–0.2)

## 2021-06-09 LAB — POCT URINALYSIS DIP (CLINITEK)
Bilirubin, UA: NEGATIVE
Blood, UA: NEGATIVE
Glucose, UA: NEGATIVE mg/dL
Ketones, POC UA: NEGATIVE mg/dL
Leukocytes, UA: NEGATIVE
Nitrite, UA: NEGATIVE
POC PROTEIN,UA: NEGATIVE
Spec Grav, UA: 1.01 (ref 1.010–1.025)
Urobilinogen, UA: 0.2 E.U./dL
pH, UA: 6.5 (ref 5.0–8.0)

## 2021-06-09 LAB — COMPREHENSIVE METABOLIC PANEL
ALT: 14 U/L (ref 0–44)
AST: 15 U/L (ref 15–41)
Albumin: 3.4 g/dL — ABNORMAL LOW (ref 3.5–5.0)
Alkaline Phosphatase: 97 U/L (ref 38–126)
Anion gap: 10 (ref 5–15)
BUN: 10 mg/dL (ref 6–20)
CO2: 25 mmol/L (ref 22–32)
Calcium: 9.4 mg/dL (ref 8.9–10.3)
Chloride: 95 mmol/L — ABNORMAL LOW (ref 98–111)
Creatinine, Ser: 0.57 mg/dL (ref 0.44–1.00)
GFR, Estimated: >60 mL/min (ref >60)
Glucose, Bld: 486 mg/dL — ABNORMAL HIGH (ref 70–99)
Potassium: 4.4 mmol/L (ref 3.5–5.1)
Sodium: 130 mmol/L — ABNORMAL LOW (ref 135–145)
Total Bilirubin: 0.6 mg/dL (ref 0.3–1.2)
Total Protein: 7.2 g/dL (ref 6.5–8.1)

## 2021-06-09 LAB — POCT GLYCOSYLATED HEMOGLOBIN (HGB A1C): HbA1c POC (<> result, manual entry): >15 % (ref 4.0–5.6)

## 2021-06-09 LAB — RESP PANEL BY RT-PCR (FLU A&B, COVID) ARPGX2
Influenza A by PCR: NEGATIVE
Influenza B by PCR: NEGATIVE
SARS Coronavirus 2 by RT PCR: NEGATIVE

## 2021-06-09 LAB — CBG MONITORING, ED
Glucose-Capillary: 466 mg/dL — ABNORMAL HIGH (ref 70–99)
Glucose-Capillary: 352 mg/dL — ABNORMAL HIGH (ref 70–99)

## 2021-06-09 LAB — GLUCOSE, POCT (MANUAL RESULT ENTRY): POC Glucose: 559 mg/dl — AB (ref 70–99)

## 2021-06-09 MED ORDER — ACCU-CHEK SOFTCLIX LANCETS MISC: 2 refills | Status: DC

## 2021-06-09 MED ORDER — ROSUVASTATIN CALCIUM 10 MG PO TABS
10.0000 mg | ORAL_TABLET | Freq: Every day | ORAL | 1 refills | Status: DC
Start: 2021-06-09 — End: 2021-12-09

## 2021-06-09 MED ORDER — INSULIN ASPART 100 UNIT/ML FLEXPEN: 6.0000 [IU] | PEN_INJECTOR | Freq: Once | SUBCUTANEOUS | Status: DC

## 2021-06-09 MED ORDER — ESOMEPRAZOLE MAGNESIUM 20 MG PO CPDR
20.0000 mg | DELAYED_RELEASE_CAPSULE | Freq: Every day | ORAL | 1 refills | Status: DC
Start: 2021-06-09 — End: 2021-12-09

## 2021-06-09 MED ORDER — FLUOXETINE HCL 40 MG PO CAPS: 40.0000 mg | ORAL_CAPSULE | Freq: Every day | ORAL | 1 refills | Status: DC

## 2021-06-09 MED ORDER — HYDROXYZINE HCL 10 MG PO TABS: 10.0000 mg | ORAL_TABLET | Freq: Three times a day (TID) | ORAL | 3 refills | Status: DC | PRN

## 2021-06-09 MED ORDER — NOVOLOG FLEXPEN 100 UNIT/ML ~~LOC~~ SOPN: 6.0000 [IU] | PEN_INJECTOR | Freq: Three times a day (TID) | SUBCUTANEOUS | 11 refills | Status: DC

## 2021-06-09 MED ORDER — GABAPENTIN 300 MG PO CAPS
600.0000 mg | ORAL_CAPSULE | Freq: Two times a day (BID) | ORAL | 1 refills | Status: DC
Start: 2021-06-09 — End: 2021-07-23

## 2021-06-09 MED ORDER — INSULIN ASPART 100 UNIT/ML IJ SOLN
6.0000 [IU] | Freq: Once | INTRAMUSCULAR | Status: AC
  Administered 2021-06-09: 6 [IU] via SUBCUTANEOUS

## 2021-06-09 MED ORDER — DIPHENOXYLATE-ATROPINE 2.5-0.025 MG PO TABS: 1.0000 | ORAL_TABLET | Freq: Four times a day (QID) | ORAL | 1 refills | Status: DC | PRN

## 2021-06-09 MED ORDER — ALBUTEROL SULFATE HFA 108 (90 BASE) MCG/ACT IN AERS: 2.0000 | INHALATION_SPRAY | RESPIRATORY_TRACT | 2 refills | Status: DC | PRN

## 2021-06-09 MED ORDER — LACTATED RINGERS IV BOLUS
1000.0000 mL | Freq: Once | INTRAVENOUS | Status: AC
  Administered 2021-06-09: 1000 mL via INTRAVENOUS

## 2021-06-09 MED ORDER — FENOFIBRATE 48 MG PO TABS: 48.0000 mg | ORAL_TABLET | Freq: Every day | ORAL | 2 refills | Status: DC

## 2021-06-09 MED ORDER — METOPROLOL SUCCINATE ER 25 MG PO TB24: 25.0000 mg | ORAL_TABLET | Freq: Every day | ORAL | 1 refills | Status: DC

## 2021-06-09 MED ORDER — LANTUS SOLOSTAR 100 UNIT/ML ~~LOC~~ SOPN: 18.0000 [IU] | PEN_INJECTOR | Freq: Every day | SUBCUTANEOUS | 6 refills | Status: DC

## 2021-06-09 NOTE — ED Provider Notes (Signed)
[facility] EMERGENCY DEPARTMENT Provider Note  CSN: [id number] Arrival date & time: 06/09/21 1451    History Chief Complaint  Patient presents with   Hyperglycemia   Shortness of Breath    [name] is a 60 y.o. female with history of poorly controlled DM has been out of her insulin for 2 months, missed an Endocrine appointment due to influenza about 2 weeks ago, had a fever initially but none now. Continues to have a productive cough. She has COPD with an inhaler at home. Continues to smoke.Seen at [facility] and sent to the ED for evaluation today.    Past Medical History:  Diagnosis Date   Anxiety    Arthritis    Chronic systolic heart failure (HCC)    EF 20-25% ECHO 05/2015   COPD (chronic obstructive pulmonary disease) (HCC)    Depression    Diabetes mellitus without complication (HCC)    Type II   Dysrhythmia    pt unsure of name of arrythmia - " my heart rate will drop all of a sudden" -no current treatment - atrial flutter   Fibrillation, atrial (HCC)    Gallstones    GERD (gastroesophageal reflux disease)    Headache(784.0)    migraines   Hypertension    Osteomyelitis (HCC)    R ankle   Rash    arms    Past Surgical History:  Procedure Laterality Date   ANKLE FRACTURE SURGERY Right 2015   CARDIAC CATHETERIZATION N/A 06/24/2015   Procedure: Left Heart Cath and Coronary Angiography;  Surgeon: Belva Crome, MD;  Location: [facility];  Service: Cardiovascular;  Laterality: N/A;   CARPAL TUNNEL RELEASE     bil   CHOLECYSTECTOMY  11/24/2011   Procedure: LAPAROSCOPIC CHOLECYSTECTOMY WITH INTRAOPERATIVE CHOLANGIOGRAM;  Surgeon: Joyice Faster. Cornett, MD;  Location: [facility];  Service: General;  Laterality: N/A;  Laparoscopic Cholecystectomy with Cholangiogram   COLONOSCOPY     ECTOPIC PREGNANCY SURGERY  many yrs ago   HARDWARE REMOVAL Right 07/15/2015   Procedure: RIGHT ANKLE HARDWARE REMOVAL, PLACEMENT OF STIMULAN ANTIBIOTIC BEADS AND PREVENA WOUND  VAC.;  Surgeon: Meredith Pel, MD;  Location: [facility];  Service: Orthopedics;  Laterality: Right;   TOOTH EXTRACTION N/A 12/03/2016   Procedure: DENTAL EXTRACTIONS with Alveoloplasty;  Surgeon: Diona Browner, DDS;  Location: [facility];  Service: Oral Surgery;  Laterality: N/A;    Family History  Problem Relation Age of Onset   Diabetes Mother    Lung cancer Father    Heart attack Father 82       CABG x4   Breast cancer Sister        Survivor     Social History   Tobacco Use   Smoking status: Light Smoker    Packs/day: 1.00    Years: 20.00    Pack years: 20.00    Types: Cigarettes, E-cigarettes    Start date: 09/19/2016   Smokeless tobacco: Never  Vaping Use   Vaping Use: Every day   Start date: 10/12/2016  Substance Use Topics   Alcohol use: No    Alcohol/week: 0.0 standard drinks   Drug use: No     Home Medications Prior to Admission medications   Medication Sig Start Date End Date Taking? Authorizing Provider  albuterol (VENTOLIN HFA) 108 (90 Base) MCG/ACT inhaler Inhale 2 puffs into the lungs every 4 (four) hours as needed for wheezing or shortness of breath. 06/09/21  Yes Charlott Rakes, MD  dextromethorphan-guaiFENesin [facility]  DM) 30-600 MG 12hr tablet Take 1 tablet by mouth 2 (two) times daily as needed for cough.   Yes [provider]  dicyclomine (BENTYL) 10 MG capsule Take 1 capsule (10 mg total) by mouth 3 (three) times daily before meals. Patient taking differently: Take 10 mg by mouth 2 (two) times daily. 04/14/21  Yes Charlott Rakes, MD  diphenoxylate-atropine (LOMOTIL) 2.5-0.025 MG tablet Take 1 tablet by mouth 4 (four) times daily as needed for diarrhea or loose stools. Patient taking differently: Take 1 tablet by mouth 2 (two) times daily. 06/09/21  Yes Charlott Rakes, MD  ergocalciferol (DRISDOL) 1.25 MG (50000 UT) capsule Take 1 capsule (50,000 Units total) by mouth once a week. Patient taking differently: Take 50,000 Units by mouth once a week.  Fridays 03/11/21  Yes Charlott Rakes, MD  esomeprazole (NEXIUM) 20 MG capsule Take 1 capsule (20 mg total) by mouth daily. 06/09/21  Yes Charlott Rakes, MD  fenofibrate (TRICOR) 48 MG tablet Take 1 tablet (48 mg total) by mouth daily. 06/09/21  Yes Charlott Rakes, MD  FLUoxetine (PROZAC) 40 MG capsule Take 1 capsule (40 mg total) by mouth at bedtime. 06/09/21  Yes Charlott Rakes, MD  hydrOXYzine (ATARAX) 10 MG tablet Take 1 tablet (10 mg total) by mouth 3 (three) times daily as needed. Patient taking differently: Take 10 mg by mouth 2 (two) times daily. 06/09/21  Yes Charlott Rakes, MD  meclizine (ANTIVERT) 25 MG tablet Take 1 tablet (25 mg total) by mouth 3 (three) times daily as needed for dizziness. Patient taking differently: Take 25 mg by mouth daily. 05/19/21  Yes Charlott Rakes, MD  metoprolol succinate (TOPROL-XL) 25 MG 24 hr tablet Take 1 tablet (25 mg total) by mouth daily. 06/09/21  Yes Charlott Rakes, MD  rosuvastatin (CRESTOR) 10 MG tablet Take 1 tablet (10 mg total) by mouth daily. 06/09/21  Yes Charlott Rakes, MD  Accu-Chek Softclix Lancets lancets Use to check blood sugar THREE TIMES DAILY 06/09/21   Truddie Hidden, MD  benzonatate (TESSALON) 200 MG capsule Take 1 capsule (200 mg total) by mouth every 8 (eight) hours. Patient not taking: Reported on 03/09/2021 10/25/20   Jacqlyn Larsen, [name]  Blood Glucose Monitoring Suppl (ACCU-CHEK GUIDE ME) w/Device KIT Use to check blood sugar TID. 01/30/21   Charlott Rakes, MD  cephALEXin (KEFLEX) 500 MG capsule Take 1 capsule (500 mg total) by mouth 4 (four) times daily. Patient not taking: Reported on 06/09/2021 03/09/21   Charlott Rakes, MD  gabapentin (NEURONTIN) 300 MG capsule Take 2 capsules (600 mg total) by mouth 2 (two) times daily. Patient not taking: Reported on 06/09/2021 06/09/21   Charlott Rakes, MD  glucose blood (ACCU-CHEK GUIDE) test strip Use to check blood sugar THREE TIMES DAILY 04/07/21   Charlott Rakes, MD   insulin aspart (NOVOLOG FLEXPEN) 100 UNIT/ML FlexPen Inject 6 Units into the skin 3 (three) times daily with meals. 06/09/21   Truddie Hidden, MD  insulin glargine (LANTUS SOLOSTAR) 100 UNIT/ML Solostar Pen Inject 18 Units into the skin daily. 06/09/21   Truddie Hidden, MD  Insulin Pen Needle 32G X 4 MM MISC 1 Device by Does not apply route daily. 12/19/20   Shamleffer, Melanie Crazier, MD  Lancet Devices [facility]) lancets Use as instructed daily. 11/24/16   Charlott Rakes, MD  TRULICITY 1.69 CV/8.9FY SOPN Inject 0.75 mg into the skin once a week. Patient not taking: Reported on 06/09/2021 04/22/21   Shamleffer, Melanie Crazier, MD  Allergies    Biaxin [clarithromycin], Clarithromycin, Lisinopril, Sulfa antibiotics, Chlorthalidone, Daptomycin, Latex, Amoxicillin-pot clavulanate, Aspirin, Cholestyramine, Dilaudid [hydromorphone hcl], and Lasix [furosemide]   Review of Systems   Review of Systems A comprehensive review of systems was completed and negative except as noted in HPI.    Physical Exam BP 125/80    Pulse 70    Temp 98.3 F (36.8 C) (Oral)    Resp 19    Ht $R'5\' 7"'iJ$  (1.702 m)    Wt 58.8 kg    LMP 07/24/2011    SpO2 96%    BMI 20.30 kg/m   Physical Exam Vitals and nursing note reviewed.  Constitutional:      Appearance: Normal appearance.  HENT:     Head: Normocephalic and atraumatic.     Nose: Nose normal.     Mouth/Throat:     Mouth: Mucous membranes are moist.  Eyes:     Extraocular Movements: Extraocular movements intact.     Conjunctiva/sclera: Conjunctivae normal.  Cardiovascular:     Rate and Rhythm: Normal rate.  Pulmonary:     Effort: Pulmonary effort is normal.     Breath sounds: Rhonchi present.  Abdominal:     General: Abdomen is flat.     Palpations: Abdomen is soft.     Tenderness: There is no abdominal tenderness.  Musculoskeletal:        General: No swelling. Normal range of motion.     Cervical back: Neck supple.  Skin:     General: Skin is warm and dry.  Neurological:     General: No focal deficit present.     Mental Status: She is alert.  Psychiatric:        Mood and Affect: Mood normal.     ED Results / Procedures / Treatments   Labs (all labs ordered are listed, but only abnormal results are displayed) Labs Reviewed  CBC - Abnormal; Notable for the following components:      Result Value   RBC 5.60 (*)    Hemoglobin 16.5 (*)    HCT 48.2 (*)    All other components within normal limits  COMPREHENSIVE METABOLIC PANEL - Abnormal; Notable for the following components:   Sodium 130 (*)    Chloride 95 (*)    Glucose, Bld 486 (*)    Albumin 3.4 (*)    All other components within normal limits  CBG MONITORING, ED - Abnormal; Notable for the following components:   Glucose-Capillary 466 (*)    All other components within normal limits  CBG MONITORING, ED - Abnormal; Notable for the following components:   Glucose-Capillary 352 (*)    All other components within normal limits  RESP PANEL BY RT-PCR (FLU A&B, COVID) [id number]    EKG EKG Interpretation  Date/Time:  Tuesday June 09 2021 14:59:07 EST Ventricular Rate:  67 PR Interval:  148 QRS Duration: 120 QT Interval:  444 QTC Calculation: 469 R Axis:   9 Text Interpretation: Normal sinus rhythm with sinus arrhythmia Minimal voltage criteria for LVH, may be normal variant ( Cornell product ) Non-specific intra-ventricular conduction delay Septal infarct , age undetermined Abnormal ECG No significant change since last tracing Confirmed by Calvert Cantor [phone]) on 06/09/2021 5:18:20 PM  Radiology DG Chest 2 View  Result Date: 06/09/2021 CLINICAL DATA:  Hyperglycemia Cough Fatigue Dyspnea EXAM: CHEST - 2 VIEW COMPARISON:  10/25/2020 FINDINGS: The heart size and mediastinal contours are within normal limits. Both lungs are clear. The visualized skeletal structures are  unremarkable. IMPRESSION: No active cardiopulmonary disease. Electronically  Signed   By: Miachel Roux M.D.   On: 06/09/2021 16:05    Procedures Procedures  Medications Ordered in the ED Medications  lactated ringers bolus 1,000 mL (0 mLs Intravenous Stopped 06/09/21 1859)  insulin aspart (novoLOG) injection 6 Units (6 Units Subcutaneous Given 06/09/21 1928)     MDM Rules/Calculators/A&P MDM Patient with hyperglycemia and cough after recent flu-like illness here from [place] for evaluation. Labs done in triage show a normal WBC, CMP with hyperglycemia without DKA. CXR is clear. She was advised to stop smoking, cannot use steroids for her cough, advised to continue inhaler at home, symptoms should improving in the next few days/weeks. Plan IVF and recheck of CBG.   ED Course  I have reviewed the triage vital signs and the nursing notes.  Pertinent labs & imaging results that were available during my care of the patient were reviewed by me and considered in my medical decision making (see chart for details).     Final Clinical Impression(s) / ED Diagnoses Final diagnoses:  Hyperglycemia  Bronchitis    Rx / DC Orders ED Discharge Orders          Ordered    Accu-Chek Softclix Lancets lancets       Note to Pharmacy: This prescription was filled on 04/07/2021. Any refills authorized will be placed on file.   06/09/21 1918    insulin aspart (NOVOLOG FLEXPEN) 100 UNIT/ML FlexPen  3 times daily with meals        06/09/21 1918    insulin glargine (LANTUS SOLOSTAR) 100 UNIT/ML Solostar Pen  Daily        06/09/21 1918             Truddie Hidden, MD 06/09/21 2151

## 2021-06-09 NOTE — ED Provider Notes (Signed)
Emergency Medicine Provider Triage Evaluation Note  [name] , a 60 y.o. female  was evaluated in triage.  Pt complains of shortness of breath and chest pain as well as lightheadedness.  She endorses recent diagnosis of flu and persistent ongoing cough since then.  She was sent in for hyperglycemia from primary care's office.  She states she has been noncompliant with her insulin for 2 months.  She is a type II diabetic.  Review of Systems  Positive: Chest pain, shortness of breath, lightheadedness Negative: Abdominal pain, nausea, vomiting  Physical Exam  BP (!) 126/91 (BP Location: Left Arm)    Pulse 76    Temp 98 F (36.7 C) (Oral)    Resp 16    Ht 5\' 7"  (1.702 m)    Wt 58.8 kg    LMP 07/24/2011    SpO2 99%    BMI 20.30 kg/m  Gen:   Awake, no distress   Resp:  Normal effort  MSK:   Moves extremities without difficulty  Other:    Medical Decision Making  Medically screening exam initiated at 3:20 PM.  Appropriate orders placed.  Nakia Remmers was informed that the remainder of the evaluation will be completed by another provider, this initial triage assessment does not replace that evaluation, and the importance of remaining in the ED until their evaluation is complete.     Kathlen Mody, [name] 06/09/21 1521    06/11/21, MD 06/10/21 [phone]

## 2021-06-09 NOTE — ED Triage Notes (Signed)
Patient sent from [facility] for hyperglycemia. Patient states she has been out of her insulin for 2 months. States recent flu and unable to get rid of cough. Feels like her heart is fluttering. C/o weight loss, fatigue and dyspnea. She was told her A1c is 15 and blood sugar is 559.

## 2021-06-09 NOTE — Progress Notes (Signed)
Had flu 2 weeks ago and still has cough.  Weight loss.

## 2021-06-09 NOTE — Progress Notes (Signed)
Subjective:  Patient ID: [name], female    DOB: 1961/03/01  Age: 60 y.o. MRN: [id number]  CC: Diabetes   HPI [name] is a 60 y.o. year old female with a history of type 2 diabetes mellitus (A1c >15), hypertension, CHF (EF 55-60%  2-D echo of 09/2016 which has improved from 25%-30% previously), anxiety, GERD, COVID-19 in 01/2020. Type 2 diabetes mellitus is managed by endocrine- Dr Lonzo Cloud; last visit was in 11/2020.  Interval History:  She has been coughing after she had the Flu 2 weeks ago and has not felt well for a while. Her CBG is 559 and she has been without insulin x2 months and she missed her appointment with endocrinology when she had the Flu.  She states her endocrinologist would not refill her insulin. She complains of malaise, fatigue, weight loss, dyspnea and her cough is productive.  She has also had chest pain. She also continues to have diarrhea which has improved with Bentyl and Imodium and I had referred her to GI but she is yet to see them.I had referred her for colonoscopy in 02/2021 and she no showed for appointment per notes in epic. Past Medical History:  Diagnosis Date   Anxiety    Arthritis    Chronic systolic heart failure (HCC)    EF 20-25% ECHO 05/2015   COPD (chronic obstructive pulmonary disease) (HCC)    Depression    Diabetes mellitus without complication (HCC)    Type II   Dysrhythmia    pt unsure of name of arrythmia - " my heart rate will drop all of a sudden" -no current treatment - atrial flutter   Fibrillation, atrial (HCC)    Gallstones    GERD (gastroesophageal reflux disease)    Headache(784.0)    migraines   Hypertension    Osteomyelitis (HCC)    R ankle   Rash    arms    Past Surgical History:  Procedure Laterality Date   ANKLE FRACTURE SURGERY Right 2015   CARDIAC CATHETERIZATION N/A 06/24/2015   Procedure: Left Heart Cath and Coronary Angiography;  Surgeon: [name], MD;  Location: [facility] INVASIVE CV  LAB;  Service: Cardiovascular;  Laterality: N/A;   CARPAL TUNNEL RELEASE     bil   CHOLECYSTECTOMY  11/24/2011   Procedure: LAPAROSCOPIC CHOLECYSTECTOMY WITH INTRAOPERATIVE CHOLANGIOGRAM;  Surgeon: Clovis Pu. Cornett, MD;  Location: [facility] ORS;  Service: General;  Laterality: N/A;  Laparoscopic Cholecystectomy with Cholangiogram   COLONOSCOPY     ECTOPIC PREGNANCY SURGERY  many yrs ago   HARDWARE REMOVAL Right 07/15/2015   Procedure: RIGHT ANKLE HARDWARE REMOVAL, PLACEMENT OF STIMULAN ANTIBIOTIC BEADS AND PREVENA WOUND VAC.;  Surgeon: [name], MD;  Location: [facility] OR;  Service: Orthopedics;  Laterality: Right;   TOOTH EXTRACTION N/A 12/03/2016   Procedure: DENTAL EXTRACTIONS with Alveoloplasty;  Surgeon: Ocie Doyne, DDS;  Location[facility] OR;  Service: Oral Surgery;  Laterality: N/A;    Family History  Problem Relation Age of Onset   Diabetes Mother    Lung cancer Father    Heart attack Father 38       CABG x4   Breast cancer Sister        Survivor     Allergies  Allergen Reactions   Biaxin [Clarithromycin] Anaphylaxis and Swelling   Clarithromycin Shortness Of Breath and Swelling   Lisinopril Swelling and Cough    Lip edema   Sulfa Antibiotics Anaphylaxis, Shortness Of Breath, Swelling and Hypertension  Chlorthalidone Nausea And Vomiting and Other (See Comments)    Clammy, Tachycardia, Headache    Daptomycin Nausea And Vomiting   Latex Hives and Rash   Amoxicillin-Pot Clavulanate Diarrhea    Has patient had a PCN reaction causing immediate rash, facial/tongue/throat swelling, SOB or lightheadedness with hypotension:No Has patient had a PCN reaction causing severe rash involving mucus membranes or skin necrosis:No Has patient had a PCN reaction that required hospitalization:No Has patient had a PCN reaction occurring within THE LAST 10 YEARS.  #  #  #  YES  #  #  #  If all of the above answers are "NO", then may proceed with Cephalosporin use.    Aspirin Nausea And Vomiting  and Rash    On $R'325mg'IQ$  dosage   Cholestyramine Nausea And Vomiting   Dilaudid [Hydromorphone Hcl] Nausea And Vomiting   Lasix [Furosemide] Nausea And Vomiting and Other (See Comments)    Headache     Outpatient Medications Prior to Visit  Medication Sig Dispense Refill   Accu-Chek Softclix Lancets lancets Use to check blood sugar THREE TIMES DAILY 100 each 2   Blood Glucose Monitoring Suppl (ACCU-CHEK GUIDE ME) w/Device KIT Use to check blood sugar TID. 1 kit 0   dicyclomine (BENTYL) 10 MG capsule Take 1 capsule (10 mg total) by mouth 3 (three) times daily before meals. 270 capsule 1   ergocalciferol (DRISDOL) 1.25 MG (50000 UT) capsule Take 1 capsule (50,000 Units total) by mouth once a week. 12 capsule 1   glucose blood (ACCU-CHEK GUIDE) test strip Use to check blood sugar THREE TIMES DAILY 100 strip 2   insulin glargine (LANTUS SOLOSTAR) 100 UNIT/ML Solostar Pen Inject 18 Units into the skin daily. 15 mL 6   Insulin Pen Needle 32G X 4 MM MISC 1 Device by Does not apply route daily. 100 each 2   Lancet Devices (ACCU-CHEK SOFTCLIX) lancets Use as instructed daily. 1 each 5   meclizine (ANTIVERT) 25 MG tablet Take 1 tablet (25 mg total) by mouth 3 (three) times daily as needed for dizziness. 180 tablet 0   albuterol (VENTOLIN HFA) 108 (90 Base) MCG/ACT inhaler Inhale 2 puffs into the lungs every 4 (four) hours as needed for wheezing or shortness of breath. 8.5 g 2   diphenoxylate-atropine (LOMOTIL) 2.5-0.025 MG tablet Take 1 tablet by mouth 4 (four) times daily as needed for diarrhea or loose stools. 60 tablet 1   esomeprazole (NEXIUM) 20 MG capsule Take 1 capsule (20 mg total) by mouth daily. 90 capsule 1   fenofibrate (TRICOR) 48 MG tablet Take 1 tablet (48 mg total) by mouth daily. 30 tablet 2   FLUoxetine (PROZAC) 40 MG capsule Take 1 capsule (40 mg total) by mouth at bedtime. 90 capsule 1   gabapentin (NEURONTIN) 300 MG capsule Take 2 capsules (600 mg total) by mouth 2 (two) times daily.  180 capsule 1   hydrOXYzine (ATARAX/VISTARIL) 10 MG tablet Take 1 tablet (10 mg total) by mouth 3 (three) times daily as needed. 60 tablet 1   metoprolol succinate (TOPROL-XL) 25 MG 24 hr tablet Take 1 tablet (25 mg total) by mouth daily. 90 tablet 1   rosuvastatin (CRESTOR) 10 MG tablet Take 1 tablet (10 mg total) by mouth daily. 90 tablet 1   tiotropium (SPIRIVA) 18 MCG inhalation capsule Place 1 capsule (18 mcg total) into inhaler and inhale daily. 90 capsule 1   benzonatate (TESSALON) 200 MG capsule Take 1 capsule (200 mg total) by mouth every  8 (eight) hours. (Patient not taking: Reported on 03/09/2021) 21 capsule 0   cephALEXin (KEFLEX) 500 MG capsule Take 1 capsule (500 mg total) by mouth 4 (four) times daily. (Patient not taking: Reported on 06/09/2021) 14 capsule 0   insulin aspart (NOVOLOG FLEXPEN) 100 UNIT/ML FlexPen Inject 6 Units into the skin 3 (three) times daily with meals. (Patient not taking: Reported on 43/32/9518) 15 mL 11   TRULICITY 8.41 YS/0.6TK SOPN Inject 0.75 mg into the skin once a week. (Patient not taking: Reported on 06/09/2021) 2 mL 4   Facility-Administered Medications Prior to Visit  Medication Dose Route Frequency Provider Last Rate Last Admin   regadenoson (LEXISCAN) injection SOLN 0.4 mg  0.4 mg Intravenous Once Dorothy Spark, MD       technetium tetrofosmin (TC-MYOVIEW) injection 16.0 millicurie  10.9 millicurie Intravenous Once PRN Dorothy Spark, MD         ROS Review of Systems  Constitutional:  Positive for fatigue and unexpected weight change. Negative for activity change and appetite change.  HENT:  Negative for congestion, sinus pressure and sore throat.   Eyes:  Negative for visual disturbance.  Respiratory:  Positive for cough and shortness of breath. Negative for chest tightness and wheezing.   Cardiovascular:  Positive for chest pain. Negative for palpitations.  Gastrointestinal:  Negative for abdominal distention, abdominal pain and  constipation.  Endocrine: Negative for polydipsia.  Genitourinary:  Negative for dysuria and frequency.  Musculoskeletal:  Negative for arthralgias and back pain.  Skin:  Negative for rash.  Neurological:  Negative for tremors, light-headedness and numbness.  Hematological:  Does not bruise/bleed easily.  Psychiatric/Behavioral:  Negative for agitation and behavioral problems.    Objective:  BP 122/78    Pulse 73    Ht $R'5\' 7"'ZR$  (1.702 m)    Wt 129 lb 9.6 oz (58.8 kg)    LMP 07/24/2011    SpO2 99%    BMI 20.30 kg/m   BP/Weight 06/09/2021 09/18/5571 07/30/252  Systolic BP 270 623 762  Diastolic BP 78 82 69  Wt. (Lbs) 129.6 141.6 146.8  BMI 20.3 22.18 22.99      Physical Exam Constitutional:      Appearance: She is well-developed. She is ill-appearing.  Cardiovascular:     Rate and Rhythm: Normal rate.     Heart sounds: Normal heart sounds. No murmur heard. Pulmonary:     Effort: Pulmonary effort is normal.     Breath sounds: Rales present. No wheezing.  Chest:     Chest wall: No tenderness.  Abdominal:     General: Bowel sounds are normal. There is no distension.     Palpations: Abdomen is soft. There is no mass.     Tenderness: There is no abdominal tenderness.  Musculoskeletal:        General: Normal range of motion.     Right lower leg: No edema.     Left lower leg: No edema.  Neurological:     Mental Status: She is alert and oriented to person, place, and time.  Psychiatric:        Mood and Affect: Mood normal.    CMP Latest Ref Rng & Units 10/25/2020 07/11/2020 04/02/2020  Glucose 70 - 99 mg/dL 497(H) 337(H) 121(H)  BUN 6 - 20 mg/dL $Remove'11 9 8  'BHocgKL$ Creatinine 0.44 - 1.00 mg/dL 0.64 0.63 0.52(L)  Sodium 135 - 145 mmol/L 131(L) 136 139  Potassium 3.5 - 5.1 mmol/L 3.8 4.4 3.9  Chloride 98 -  111 mmol/L 97(L) 98 102  CO2 22 - 32 mmol/L $RemoveB'25 22 22  'TVvEXGJe$ Calcium 8.9 - 10.3 mg/dL 8.9 9.4 9.5  Total Protein 6.0 - 8.5 g/dL - 7.3 -  Total Bilirubin 0.0 - 1.2 mg/dL - 0.4 -  Alkaline Phos 44  - 121 IU/L - 122(H) -  AST 0 - 40 IU/L - 19 -  ALT 0 - 32 IU/L - 23 -    Lipid Panel     Component Value Date/Time   CHOL 151 07/31/2019 1004   TRIG 287 (H) 07/31/2019 1004   HDL 27 (L) 07/31/2019 1004   CHOLHDL 5.6 (H) 07/31/2019 1004   CHOLHDL 5.1 (H) 07/05/2016 0947   VLDL UNABLE TO CALCULATE IF TRIGLYCERIDE OVER 400 mg/dL 05/24/2016 0415   LDLCALC 77 07/31/2019 1004    CBC    Component Value Date/Time   WBC 12.5 (H) 10/25/2020 1722   RBC 5.29 (H) 10/25/2020 1722   HGB 15.2 (H) 10/25/2020 1722   HCT 46.4 (H) 10/25/2020 1722   PLT 215 10/25/2020 1722   MCV 87.7 10/25/2020 1722   MCH 28.7 10/25/2020 1722   MCHC 32.8 10/25/2020 1722   RDW 12.3 10/25/2020 1722   LYMPHSABS 2.6 04/01/2016 2144   MONOABS 0.4 04/01/2016 2144   EOSABS 0.3 04/01/2016 2144   BASOSABS 0.0 04/01/2016 2144    Lab Results  Component Value Date   HGBA1C >15 06/09/2021    Assessment & Plan:  1. Type 2 diabetes mellitus with other neurologic complication, with long-term current use of insulin (HCC) Uncontrolled with A1c of greater than 15 with severe hyperglycemia due to being out of insulin x2 months with underlying acute illness CBG of 559 in the clinic; high risk of progression to DKA She needs IV fluids and insulin-referred to ED stat Diabetes is managed by endocrine-Dr. Kelton Pillar and she needs to schedule an urgent appointment for optimization of management - POCT glucose (manual entry) - POCT glycosylated hemoglobin (Hb A1C) - gabapentin (NEURONTIN) 300 MG capsule; Take 2 capsules (600 mg total) by mouth 2 (two) times daily.  Dispense: 180 capsule; Refill: 1 - rosuvastatin (CRESTOR) 10 MG tablet; Take 1 tablet (10 mg total) by mouth daily.  Dispense: 90 tablet; Refill: 1 - LP+Non-HDL Cholesterol; Future  2. History of gastroesophageal reflux (GERD) Stable - esomeprazole (NEXIUM) 20 MG capsule; Take 1 capsule (20 mg total) by mouth daily.  Dispense: 90 capsule; Refill: 1  3.  Anxiety Controlled - FLUoxetine (PROZAC) 40 MG capsule; Take 1 capsule (40 mg total) by mouth at bedtime.  Dispense: 90 capsule; Refill: 1 - hydrOXYzine (ATARAX) 10 MG tablet; Take 1 tablet (10 mg total) by mouth 3 (three) times daily as needed.  Dispense: 60 tablet; Refill: 3  4. Hypertension complicating diabetes ([place]) Controlled Counseled on blood pressure goal of less than 130/80, low-sodium, DASH diet, medication compliance, 150 minutes of moderate intensity exercise per week. Discussed medication compliance, adverse effects. - metoprolol succinate (TOPROL-XL) 25 MG 24 hr tablet; Take 1 tablet (25 mg total) by mouth daily.  Dispense: 90 tablet; Refill: 1  5. Irritable bowel syndrome with diarrhea Improving In the light of ongoing weight loss she needs to schedule appointment with GI and has been encouraged to do so as I have worked up her weight loss so far and it has been negative.  She needs a colonoscopy - diphenoxylate-atropine (LOMOTIL) 2.5-0.025 MG tablet; Take 1 tablet by mouth 4 (four) times daily as needed for diarrhea or loose stools.  Dispense:  60 tablet; Refill: 1  6. Other cough Initially started out as the flu but suspicious for progression to pneumonia She needs chest x-ray and additional work-up given she is high risk due to underlying cardiac condition and diabetes mellitus She will be seen in the ED for further evaluation.   Health Care Maintenance: No showed for colonoscopy appointment Meds ordered this encounter  Medications   albuterol (VENTOLIN HFA) 108 (90 Base) MCG/ACT inhaler    Sig: Inhale 2 puffs into the lungs every 4 (four) hours as needed for wheezing or shortness of breath.    Dispense:  8.5 g    Refill:  2   esomeprazole (NEXIUM) 20 MG capsule    Sig: Take 1 capsule (20 mg total) by mouth daily.    Dispense:  90 capsule    Refill:  1    This prescription was filled on 02/17/2021. Any refills authorized will be placed on file.   fenofibrate  (TRICOR) 48 MG tablet    Sig: Take 1 tablet (48 mg total) by mouth daily.    Dispense:  30 tablet    Refill:  2   FLUoxetine (PROZAC) 40 MG capsule    Sig: Take 1 capsule (40 mg total) by mouth at bedtime.    Dispense:  90 capsule    Refill:  1    This prescription was filled on 01/29/2021. Any refills authorized will be placed on file.   gabapentin (NEURONTIN) 300 MG capsule    Sig: Take 2 capsules (600 mg total) by mouth 2 (two) times daily.    Dispense:  180 capsule    Refill:  1   metoprolol succinate (TOPROL-XL) 25 MG 24 hr tablet    Sig: Take 1 tablet (25 mg total) by mouth daily.    Dispense:  90 tablet    Refill:  1   rosuvastatin (CRESTOR) 10 MG tablet    Sig: Take 1 tablet (10 mg total) by mouth daily.    Dispense:  90 tablet    Refill:  1   diphenoxylate-atropine (LOMOTIL) 2.5-0.025 MG tablet    Sig: Take 1 tablet by mouth 4 (four) times daily as needed for diarrhea or loose stools.    Dispense:  60 tablet    Refill:  1    This prescription was filled on 05/18/2021. Any refills authorized will be placed on file.   hydrOXYzine (ATARAX) 10 MG tablet    Sig: Take 1 tablet (10 mg total) by mouth 3 (three) times daily as needed.    Dispense:  60 tablet    Refill:  3    This prescription was filled on 05/08/2020. Any refills authorized will be placed on file.    Follow-up: No follow-ups on file.       Charlott Rakes, MD, FAAFP. [facility] [place], [place]   06/09/2021, 2:04 PM

## 2021-06-09 NOTE — Patient Instructions (Addendum)
Please go to the ED right away for further evaluation.   Please call [facility] to schedule your Colonoscopy; [address] [place] [address]  Ph# [phone]

## 2021-06-09 NOTE — ED Notes (Signed)
Patient transported to X-ray 

## 2021-06-09 NOTE — ED Notes (Signed)
Pt provided discharge instructions and prescription information. Pt was given the opportunity to ask questions and questions were answered. Discharge signature not obtained in the setting of the COVID-19 pandemic in order to reduce high touch surfaces.  ° °

## 2021-06-11 ENCOUNTER — Other Ambulatory Visit: Payer: Self-pay

## 2021-06-11 ENCOUNTER — Ambulatory Visit: Payer: Medicaid Other | Attending: Family Medicine

## 2021-06-11 DIAGNOSIS — Z794 Long term (current) use of insulin: Secondary | ICD-10-CM

## 2021-06-12 ENCOUNTER — Telehealth: Payer: Self-pay

## 2021-06-12 ENCOUNTER — Other Ambulatory Visit: Payer: Self-pay | Admitting: Family Medicine

## 2021-06-12 LAB — LP+NON-HDL CHOLESTEROL
Cholesterol, Total: 114 mg/dL (ref 100–199)
HDL: 28 mg/dL — ABNORMAL LOW (ref >39)
LDL Chol Calc (NIH): 39 mg/dL (ref 0–99)
Total Non-HDL-Chol (LDL+VLDL): 86 mg/dL (ref 0–129)
Triglycerides: 316 mg/dL — ABNORMAL HIGH (ref 0–149)
VLDL Cholesterol Cal: 47 mg/dL — ABNORMAL HIGH (ref 5–40)

## 2021-06-12 MED ORDER — FENOFIBRATE 145 MG PO TABS: 145.0000 mg | ORAL_TABLET | Freq: Every day | ORAL | 1 refills | Status: DC

## 2021-06-12 NOTE — Telephone Encounter (Signed)
Patient name and DOB has been verified Patient was informed of lab results. Patient had no questions.  

## 2021-06-12 NOTE — Telephone Encounter (Signed)
-----   Message from Hoy Register, MD sent at 06/12/2021  9:36 AM EST ----- Please inform her that her total cholesterol is normal but triglyceride which is a type of cholesterol is elevated and I have increased her fenofibrate dose to 145 mcg with a new prescription sent to her pharmacy.  Compliance with a low-cholesterol diet will also be beneficial.  Thank you

## 2021-06-16 ENCOUNTER — Encounter: Payer: Medicaid Other | Attending: Internal Medicine | Admitting: Nutrition

## 2021-07-01 ENCOUNTER — Ambulatory Visit: Payer: Medicaid Other | Admitting: Cardiovascular Disease

## 2021-07-01 ENCOUNTER — Other Ambulatory Visit: Payer: Self-pay

## 2021-07-01 ENCOUNTER — Other Ambulatory Visit: Payer: Self-pay | Admitting: Family Medicine

## 2021-07-01 ENCOUNTER — Encounter: Payer: Medicaid Other | Attending: Family Medicine | Admitting: Nutrition

## 2021-07-01 DIAGNOSIS — E1165 Type 2 diabetes mellitus with hyperglycemia: Secondary | ICD-10-CM | POA: Insufficient documentation

## 2021-07-01 DIAGNOSIS — Z794 Long term (current) use of insulin: Secondary | ICD-10-CM | POA: Insufficient documentation

## 2021-07-01 DIAGNOSIS — K580 Irritable bowel syndrome with diarrhea: Secondary | ICD-10-CM

## 2021-07-02 NOTE — Telephone Encounter (Signed)
Requested medications are due for refill today.  A little too soon  Requested medications are on the active medications list.  yes  Last refill. 06/09/2021  Future visit scheduled.   yes  Notes to clinic.  Medication not delegated.    Requested Prescriptions  Pending Prescriptions Disp Refills   diphenoxylate-atropine (LOMOTIL) 2.5-0.025 MG tablet [Pharmacy Med Name: diphenoxylate-atropine 2.5 mg-0.025 mg tablet] 60 tablet 1    Sig: Take 1 tablet by mouth 4 (four) times daily as needed for diarrhea or loose stools.     Not Delegated - Gastroenterology:  Antidiarrheals Failed - 07/01/2021 11:57 AM      Failed - This refill cannot be delegated      Passed - Valid encounter within last 12 months    Recent Outpatient Visits           3 weeks ago Type 2 diabetes mellitus with other neurologic complication, with long-term current use of insulin (HCC)   [facility] [place], Odette Horns, MD   3 months ago Annual physical exam   [facility] Hoy Register, MD   5 months ago Trigger ring finger of right hand   [facility] [place], Odette Horns, MD   1 year ago Type 2 diabetes mellitus with hyperglycemia, with long-term current use of insulin (HCC)   [facility] [place], Odette Horns, MD   1 year ago Type 2 diabetes mellitus with hyperglycemia, with long-term current use of insulin (HCC)   [facility] Hoy Register, MD       Future Appointments             In 2 months Hoy Register, MD [facility]

## 2021-07-04 NOTE — Patient Instructions (Signed)
Stop eating cold cereal and milk, Stop drink fruit juices and switch to non caloric drinks. Limit HS snacking to no more than 250 calories.   Take Novolog as directed before all meals Call MD and ask to start Novolog 3u ac HS snack, if you can not reduce amount of food you are eating at this time.

## 2021-07-04 NOTE — Progress Notes (Signed)
Patient is here with her daughter to "learn how to eat" Insulin dose:  Lantus 18 at noon.                   Taking no Novolog     SBGM:  FBSs 260-600.   Typical day: 8AM: up.  Water to drinkl 9AM: honeynut cereal, or Coco puffs with whole milk, or 2           Pieces of toast with jelly and peanut butter , or sausage          Biscuit from [place] 2-3 PM: package of Nabs-6, or Ritz crackers with peanut.  Pinapple juice--3 glasses per day.          Butter. 6-7PM: Pizza 2 slices, diet drink, or meat 3-4 ounces with              Donzetta Sprung, yams sweetened, and corn bread 9PM: 4-5 scoups of ice cream, with milk, or 4 small choc. Chip           Cookies with milk, or coffee, or cheese and 11PM:  ritz crackers-8 with cheese          8-10 Discussion:  Need for balanced meal of carbs, protein and fats and which foods are in each group. Need for Novolog ac meals and HS snacks. Need to stop eating cold cereal and milk, stop drink fruit juices and switch to non caloric drinks,and limit HS snacking to no more than 250 calories.

## 2021-07-21 ENCOUNTER — Other Ambulatory Visit: Payer: Self-pay | Admitting: Family Medicine

## 2021-07-21 DIAGNOSIS — Z794 Long term (current) use of insulin: Secondary | ICD-10-CM

## 2021-07-21 NOTE — Telephone Encounter (Signed)
Requested medication (s) are due for refill today:   Provider to review  Requested medication (s) are on the active medication list:   Yes  Future visit scheduled:   Yes in 1 mo. With [place]   Last ordered: 06/09/2021 #180, 1 refill however pt was sent to ED from her appt so there is a note stating she is not taking this.   But pharmacy noting it was refilled today 07/21/2021.    Provider to review for further refills.   Requested Prescriptions  Pending Prescriptions Disp Refills   gabapentin (NEURONTIN) 300 MG capsule [Pharmacy Med Name: gabapentin 300 mg capsule] 180 capsule 1    Sig: Take 2 capsules (600 mg total) by mouth 2 (two) times daily.     Neurology: Anticonvulsants - gabapentin Passed - 07/21/2021  9:25 AM      Passed - Valid encounter within last 12 months    Recent Outpatient Visits           1 month ago Type 2 diabetes mellitus with other neurologic complication, with long-term current use of insulin (HCC)   [facility] [place], Odette Horns, MD   4 months ago Annual physical exam   [facility] Hoy Register, MD   5 months ago Trigger ring finger of right hand   [facility] [place], Odette Horns, MD   1 year ago Type 2 diabetes mellitus with hyperglycemia, with long-term current use of insulin (HCC)   [facility] [place], Odette Horns, MD   1 year ago Type 2 diabetes mellitus with hyperglycemia, with long-term current use of insulin (HCC)   [facility] Hoy Register, MD       Future Appointments             In 1 month Hoy Register, MD [facility]

## 2021-07-28 NOTE — Progress Notes (Signed)
Name: [name]  MRN/ DOB: [id number], 01-Nov-1960   Age/ Sex: 61 y.o., female    PCP: Charlott Rakes, MD   Reason for Endocrinology Evaluation: Type 2 Diabetes Mellitus     Date of Initial Endocrinology Visit: 12/19/2020    PATIENT IDENTIFIER: [name] is a 61 y.o. female with a past medical history of T2DM, HTN, COPD  , retinitis pigmentosa ,and CHF. The patient presented for initial endocrinology clinic visit on 12/19/2020 for consultative assistance with her diabetes management.    HPI: [name] was    Diagnosed with DM 2017 Prior Medications tried/Intolerance: Metformin - diarrhea, Trulicity - nausea  Currently checking blood sugars 0 x / day,   Hypoglycemia episodes : Unknown                 Hemoglobin A1c has ranged from 9.0% in 2018, peaking at 13.4% in 2022. Patient required assistance for hypoglycemia: no  Patient has required hospitalization within the last 1 year from hyper or hypoglycemia:   In terms of diet, the patient does not eat actual meal, snacks occasionally . Drinks sweet tea and diet Dr. Malachi Bonds   Had COVID twice 05/2020 and 07/2020. She has stopped insulin due to hypoglycemia due to being sick at the time COVID in 05/2020  She has concurrent vaginal yeast infection with irritation   On her initial visit to our clinic her A1c was 11.9% , she was on insulin mix and Glimepiride , which we stopped and started her on Trulicity and Lantus    SUBJECTIVE:      During the last visit (12/19/2020): A1c 11.9% We stopped insulin mix and Glimepiride and started Lantus and Trulicity   Today (98/92/11): [name] is here for a follow up on diabetes management. She is accompanied by her daughter today .She has not been to our clinic in 8 months.   . She checks her blood sugars 4-5  times daily. The patient has not had hypoglycemic episodes since the last clinic visit.   She presented to the ED 06/09/2021 with hyperglycemia   She has  nausea, vomiting and diarrhea    HOME DIABETES REGIMEN: Novolog 7 units with each meal  Lantus 18 units     Statin: yes ACE-I/ARB: Allergic to lisinopril - swelling  Prior Diabetic Education: no   METER DOWNLOAD SUMMARY:   190 - 486 mg/dL    DIABETIC COMPLICATIONS: Microvascular complications:  Neuropathy Denies: CKD,retinopathy  Last eye exam: 12/2020  Macrovascular complications:  CHF  Denies: CAD, PVD, CVA   PAST HISTORY: Past Medical History:  Past Medical History:  Diagnosis Date   Anxiety    Arthritis    Chronic systolic heart failure (HCC)    EF 20-25% ECHO 05/2015   COPD (chronic obstructive pulmonary disease) ([place])    Depression    Diabetes mellitus without complication ([place])    Type II   Dysrhythmia    pt unsure of name of arrythmia - " my heart rate will drop all of a sudden" -no current treatment - atrial flutter   Fibrillation, atrial (HCC)    Gallstones    GERD (gastroesophageal reflux disease)    Headache(784.0)    migraines   Hypertension    Osteomyelitis (HCC)    R ankle   Rash    arms   Past Surgical History:  Past Surgical History:  Procedure Laterality Date   ANKLE FRACTURE SURGERY Right 2015   CARDIAC CATHETERIZATION N/A 06/24/2015  Procedure: Left Heart Cath and Coronary Angiography;  Surgeon: Belva Crome, MD;  Location: [facility];  Service: Cardiovascular;  Laterality: N/A;   CARPAL TUNNEL RELEASE     bil   CHOLECYSTECTOMY  11/24/2011   Procedure: LAPAROSCOPIC CHOLECYSTECTOMY WITH INTRAOPERATIVE CHOLANGIOGRAM;  Surgeon: Joyice Faster. Cornett, MD;  Location: [facility];  Service: General;  Laterality: N/A;  Laparoscopic Cholecystectomy with Cholangiogram   COLONOSCOPY     ECTOPIC PREGNANCY SURGERY  many yrs ago   HARDWARE REMOVAL Right 07/15/2015   Procedure: RIGHT ANKLE HARDWARE REMOVAL, PLACEMENT OF STIMULAN ANTIBIOTIC BEADS AND PREVENA WOUND VAC.;  Surgeon: Meredith Pel, MD;  Location: [facility];  Service: Orthopedics;   Laterality: Right;   TOOTH EXTRACTION N/A 12/03/2016   Procedure: DENTAL EXTRACTIONS with Alveoloplasty;  Surgeon: Diona Browner, DDS;  Location: [facility];  Service: Oral Surgery;  Laterality: N/A;    Social History:  reports that she has been smoking cigarettes and e-cigarettes. She started smoking about 4 years ago. She has a 20.00 pack-year smoking history. She has never used smokeless tobacco. She reports that she does not drink alcohol and does not use drugs. Family History:  Family History  Problem Relation Age of Onset   Diabetes Mother    Lung cancer Father    Heart attack Father 65       CABG x4   Breast cancer Sister        Survivor      HOME MEDICATIONS: Allergies as of 07/29/2021       Reactions   Biaxin [clarithromycin] Anaphylaxis, Swelling   Clarithromycin Shortness Of Breath, Swelling   Lisinopril Swelling, Cough   Lip edema   Sulfa Antibiotics Anaphylaxis, Shortness Of Breath, Swelling, Hypertension   Chlorthalidone Nausea And Vomiting, Other (See Comments)   Clammy, Tachycardia, Headache   Daptomycin Nausea And Vomiting   Latex Hives, Rash   Amoxicillin-pot Clavulanate Diarrhea   Has patient had a PCN reaction causing immediate rash, facial/tongue/throat swelling, SOB or lightheadedness with hypotension:No Has patient had a PCN reaction causing severe rash involving mucus membranes or skin necrosis:No Has patient had a PCN reaction that required hospitalization:No Has patient had a PCN reaction occurring within THE LAST 10 YEARS.  #  #  #  YES  #  #  #  If all of the above answers are "NO", then may proceed with Cephalosporin use.   Aspirin Nausea And Vomiting, Rash   On 354m dosage   Cholestyramine Nausea And Vomiting   Dilaudid [hydromorphone Hcl] Nausea And Vomiting   Lasix [furosemide] Nausea And Vomiting, Other (See Comments)   Headache         Medication List        Accurate as of July 29, 2021  1:16 PM. If you have any questions, ask your nurse  or doctor.          STOP taking these medications    benzonatate 200 MG capsule Commonly known as: TESSALON Stopped by: IDorita Sciara MD   cephALEXin 500 MG capsule Commonly known as: KEFLEX Stopped by: IDorita Sciara MD   dextromethorphan-guaiFENesin 30-600 MG 12hr tablet Commonly known as: MUCINEX DM Stopped by: IDorita Sciara MD   Trulicity 00.98MJX/9.1YNSopn Generic drug: Dulaglutide Stopped by: IDorita Sciara MD       TAKE these medications    Accu-Chek Guide Me w/Device Kit Use to check blood sugar TID.   Accu-Chek Guide test strip Generic drug: glucose blood Use  to check blood sugar THREE TIMES DAILY   accu-chek softclix lancets Use as instructed daily.   Accu-Chek Softclix Lancets lancets Use to check blood sugar THREE TIMES DAILY   albuterol 108 (90 Base) MCG/ACT inhaler Commonly known as: VENTOLIN HFA Inhale 2 puffs into the lungs every 4 (four) hours as needed for wheezing or shortness of breath.   dicyclomine 10 MG capsule Commonly known as: BENTYL Take 1 capsule (10 mg total) by mouth 3 (three) times daily before meals. What changed: when to take this   diphenoxylate-atropine 2.5-0.025 MG tablet Commonly known as: LOMOTIL Take 1 tablet by mouth 2 (two) times daily.   ergocalciferol 1.25 MG (50000 UT) capsule Commonly known as: Drisdol Take 1 capsule (50,000 Units total) by mouth once a week. What changed: additional instructions   esomeprazole 20 MG capsule Commonly known as: NEXIUM Take 1 capsule (20 mg total) by mouth daily.   fenofibrate 145 MG tablet Commonly known as: TRICOR Take 1 tablet (145 mg total) by mouth daily.   FLUoxetine 40 MG capsule Commonly known as: PROZAC Take 1 capsule (40 mg total) by mouth at bedtime.   gabapentin 300 MG capsule Commonly known as: NEURONTIN Take 2 capsules (600 mg total) by mouth 2 (two) times daily.   hydrOXYzine 10 MG tablet Commonly known as: ATARAX Take  1 tablet (10 mg total) by mouth 3 (three) times daily as needed. What changed: when to take this   Insulin Pen Needle 32G X 4 MM Misc 1 Device by Does not apply route daily.   Lantus SoloStar 100 UNIT/ML Solostar Pen Generic drug: insulin glargine Inject 18 Units into the skin daily.   meclizine 25 MG tablet Commonly known as: ANTIVERT Take 1 tablet (25 mg total) by mouth 3 (three) times daily as needed for dizziness. What changed: when to take this   metoprolol succinate 25 MG 24 hr tablet Commonly known as: TOPROL-XL Take 1 tablet (25 mg total) by mouth daily.   NovoLOG FlexPen 100 UNIT/ML FlexPen Generic drug: insulin aspart Inject 6 Units into the skin 3 (three) times daily with meals. What changed: how much to take   rosuvastatin 10 MG tablet Commonly known as: Crestor Take 1 tablet (10 mg total) by mouth daily.         ALLERGIES: Allergies  Allergen Reactions   Biaxin [Clarithromycin] Anaphylaxis and Swelling   Clarithromycin Shortness Of Breath and Swelling   Lisinopril Swelling and Cough    Lip edema   Sulfa Antibiotics Anaphylaxis, Shortness Of Breath, Swelling and Hypertension   Chlorthalidone Nausea And Vomiting and Other (See Comments)    Clammy, Tachycardia, Headache    Daptomycin Nausea And Vomiting   Latex Hives and Rash   Amoxicillin-Pot Clavulanate Diarrhea    Has patient had a PCN reaction causing immediate rash, facial/tongue/throat swelling, SOB or lightheadedness with hypotension:No Has patient had a PCN reaction causing severe rash involving mucus membranes or skin necrosis:No Has patient had a PCN reaction that required hospitalization:No Has patient had a PCN reaction occurring within THE LAST 10 YEARS.  #  #  #  YES  #  #  #  If all of the above answers are "NO", then may proceed with Cephalosporin use.    Aspirin Nausea And Vomiting and Rash    On 336m dosage   Cholestyramine Nausea And Vomiting   Dilaudid [Hydromorphone Hcl] Nausea  And Vomiting   Lasix [Furosemide] Nausea And Vomiting and Other (See Comments)    Headache  REVIEW OF SYSTEMS: A comprehensive ROS was conducted with the patient and is negative except as per HPI and below:  Review of Systems  Gastrointestinal:  Positive for nausea and vomiting.  Neurological:  Positive for tingling.  Endo/Heme/Allergies:  Positive for polydipsia.     OBJECTIVE:   VITAL SIGNS: BP 104/70 (BP Location: Left Arm, Patient Position: Sitting, Cuff Size: Small)    Pulse 69    Ht 5' 7" (1.702 m)    Wt 134 lb (60.8 kg)    LMP 07/24/2011    SpO2 99%    BMI 20.99 kg/m    PHYSICAL EXAM:  General: Pt appears well and is in NAD  Neck: General: Supple without adenopathy or carotid bruits. Thyroid: Thyroid size normal.  No goiter or nodules appreciated.   Lungs: Clear with good BS bilat with no rales, rhonchi, or wheezes  Heart: RRR  Abdomen: Normoactive bowel sounds, soft, nontender, without masses or organomegaly palpable  Extremities:  Lower extremities - No pretibial edema. No lesions.  Skin: Normal texture and temperature to palpation. No rash noted.  Neuro: MS is good with appropriate affect, pt is alert and Ox3     DATA REVIEWED:  Lab Results  Component Value Date   HGBA1C 13.9 (A) 07/29/2021   HGBA1C >15 06/09/2021   HGBA1C 11.9 (A) 12/19/2020   Lab Results  Component Value Date   LDLCALC 39 06/11/2021   CREATININE 0.57 06/09/2021   Lab Results  Component Value Date   MICRALBCREAT 21 03/09/2021    Lab Results  Component Value Date   CHOL 114 06/11/2021   HDL 28 (L) 06/11/2021   LDLCALC 39 06/11/2021   TRIG 316 (H) 06/11/2021   CHOLHDL 5.6 (H) 07/31/2019        ASSESSMENT / PLAN / RECOMMENDATIONS:   1) Type 2 Diabetes Mellitus, Poorly controlled, With neuropathic  complications - Most recent A1c of 13.9%. Goal A1c < 7.0 %.     -Unfortunately she continues to show up for her endocrinology visits on average once a year, we discussed the  importance of keeping up with her visitations and managing hyperglycemia sooner than later. -Intolerant to Trulicity 8.29 mg weekly -She is on less insulin doses than previously prescribed -I will adjust her MDI regimen as below -We will prescribe Dexcom, patient to contact our office when she receives it so I can refer her to our educator for training.  We discussed the importance of using the Dexcom 80% of the time for her follow-up visit otherwise Medicaid will not continue to cover CGM  MEDICATIONS: Increase Lantus ( long acting insulin ) 24 units once daily  Increase NovoLog to 10 units 3 times daily before every meal Correction factor: NovoLog (BG -130/30)   EDUCATION / INSTRUCTIONS: BG monitoring instructions: Patient is instructed to check her blood sugars 1 times a day, fasting. Call [facility] if: BG persistently < 70  I reviewed the Rule of 15 for the treatment of hypoglycemia in detail with the patient. Literature supplied.   2) Diabetic complications:  Eye: Does not have known diabetic retinopathy.  Neuro/ Feet: Does  have known diabetic peripheral neuropathy. Renal: Patient does not have known baseline CKD. She is not on an ACEI/ARB at present.     F/U in 4 months    Signed electronically by: Mack Guise, MD  [facility]  [facility] [address] [place], [place] [address] Phone: [phone] FAX: [phone]   CC:  Charlott Rakes, MD [place] [address] Phone: [phone]  Fax: [phone]    Return to Endocrinology clinic as below: Future Appointments  Date Time Provider [place]  09/07/2021  1:30 PM Charlott Rakes, MD [id number] None

## 2021-07-29 ENCOUNTER — Other Ambulatory Visit: Payer: Self-pay

## 2021-07-29 ENCOUNTER — Ambulatory Visit (INDEPENDENT_AMBULATORY_CARE_PROVIDER_SITE_OTHER): Payer: Medicaid Other | Admitting: Internal Medicine

## 2021-07-29 ENCOUNTER — Encounter: Payer: Self-pay | Admitting: Internal Medicine

## 2021-07-29 VITALS — BP 104/70 | HR 69 | Ht 67.0 in | Wt 134.0 lb

## 2021-07-29 DIAGNOSIS — E1142 Type 2 diabetes mellitus with diabetic polyneuropathy: Secondary | ICD-10-CM | POA: Diagnosis not present

## 2021-07-29 DIAGNOSIS — Z794 Long term (current) use of insulin: Secondary | ICD-10-CM

## 2021-07-29 DIAGNOSIS — E1149 Type 2 diabetes mellitus with other diabetic neurological complication: Secondary | ICD-10-CM | POA: Diagnosis not present

## 2021-07-29 DIAGNOSIS — E1165 Type 2 diabetes mellitus with hyperglycemia: Secondary | ICD-10-CM

## 2021-07-29 LAB — POCT GLYCOSYLATED HEMOGLOBIN (HGB A1C): Hemoglobin A1C: 13.9 % — AB (ref 4.0–5.6)

## 2021-07-29 MED ORDER — DEXCOM G6 TRANSMITTER MISC: 1.0000 | 3 refills | Status: DC

## 2021-07-29 MED ORDER — DEXCOM G6 SENSOR MISC: 1.0000 | 3 refills | Status: DC

## 2021-07-29 MED ORDER — ACCU-CHEK GUIDE VI STRP: 1.0000 | ORAL_STRIP | Freq: Four times a day (QID) | 3 refills | Status: DC

## 2021-07-29 MED ORDER — DEXCOM G6 RECEIVER DEVI: 1.0000 | 0 refills | Status: DC

## 2021-07-29 NOTE — Patient Instructions (Signed)
-   INcrease Lantus to 24 units daily  - Increase Novolog to 10 units with each meal  - NOvolog correctional insulin: ADD extra units on insulin to your meal-time Novolog dose if your blood sugars are higher than 160. Use the scale below to help guide you:   Blood sugar before meal Number of units to inject  Less than 160 0 unit  161 -  190 1 units  191 -  220 2 units  221 -  250 3 units  251 -  280 4 units  281 -  310 5 units  310 -  340 6 units  341 -  370 7 units  371 -  400 8 units     HOW TO TREAT LOW BLOOD SUGARS (Blood sugar LESS THAN 70 MG/DL) Please follow the RULE OF 15 for the treatment of hypoglycemia treatment (when your (blood sugars are less than 70 mg/dL)   STEP 1: Take 15 grams of carbohydrates when your blood sugar is low, which includes:  3-4 GLUCOSE TABS  OR 3-4 OZ OF JUICE OR REGULAR SODA OR ONE TUBE OF GLUCOSE GEL    STEP 2: RECHECK blood sugar in 15 MINUTES STEP 3: If your blood sugar is still low at the 15 minute recheck --> then, go back to STEP 1 and treat AGAIN with another 15 grams of carbohydrates.

## 2021-07-30 MED ORDER — LANTUS SOLOSTAR 100 UNIT/ML ~~LOC~~ SOPN: 24.0000 [IU] | PEN_INJECTOR | Freq: Every day | SUBCUTANEOUS | 2 refills | Status: DC

## 2021-07-30 MED ORDER — NOVOLOG FLEXPEN 100 UNIT/ML ~~LOC~~ SOPN
PEN_INJECTOR | SUBCUTANEOUS | 2 refills | Status: DC
Start: 2021-07-30 — End: 2022-04-09

## 2021-07-30 MED ORDER — INSULIN PEN NEEDLE 32G X 4 MM MISC: 1.0000 | Freq: Four times a day (QID) | 2 refills | Status: DC

## 2021-08-05 ENCOUNTER — Other Ambulatory Visit (HOSPITAL_COMMUNITY): Payer: Self-pay

## 2021-08-05 ENCOUNTER — Telehealth: Payer: Self-pay

## 2021-08-05 NOTE — Telephone Encounter (Signed)
Patient Advocate Encounter   Received notification from [facility] that prior authorization for Dexcom G6 Transmitter is required by his/her insurance [employer]   PA submitted on 08/05/21  Key#: [id number]  Status is pending    [facility] will continue to follow:  Patient Advocate Fax: [phone]

## 2021-08-05 NOTE — Telephone Encounter (Signed)
Patient Advocate Encounter  Prior Authorization for [employer] has been approved.    PA# [id number]  Effective dates: 08/05/21 through 02/02/22  Per Test Claim Patients co-pay is $0.   Spoke with Pharmacy to Process.  Patient Advocate Fax: [phone]

## 2021-08-10 ENCOUNTER — Other Ambulatory Visit: Payer: Self-pay | Admitting: Family Medicine

## 2021-08-10 DIAGNOSIS — F419 Anxiety disorder, unspecified: Secondary | ICD-10-CM

## 2021-08-11 NOTE — Telephone Encounter (Signed)
Refused, filled yesterday, duplicate request.  Requested Prescriptions  Pending Prescriptions Disp Refills   hydrOXYzine (ATARAX) 10 MG tablet [Pharmacy Med Name: hydroxyzine HCl 10 mg tablet] 60 tablet 3    Sig: Take 1 tablet (10 mg total) by mouth 3 (three) times daily as needed.     Ear, Nose, and Throat:  Antihistamines 2 Passed - 08/10/2021 10:27 AM      Passed - Cr in normal range and within 360 days    Creat  Date Value Ref Range Status  07/05/2016 0.59 0.50 - 1.05 mg/dL Final    Comment:      For patients > or = 60 years of age: The upper reference limit for Creatinine is approximately 13% higher for people identified as African-American.      Creatinine, Ser  Date Value Ref Range Status  06/09/2021 0.57 0.44 - 1.00 mg/dL Final          Passed - Valid encounter within last 12 months    Recent Outpatient Visits           2 months ago Type 2 diabetes mellitus with other neurologic complication, with long-term current use of insulin (HCC)   [facility] [place], Odette Horns, MD   5 months ago Annual physical exam   [facility] Hoy Register, MD   6 months ago Trigger ring finger of right hand   [facility] [place], Odette Horns, MD   1 year ago Type 2 diabetes mellitus with hyperglycemia, with long-term current use of insulin (HCC)   [place] [place], Odette Horns, MD   1 year ago Type 2 diabetes mellitus with hyperglycemia, with long-term current use of insulin (HCC)   [facility] Hoy Register, MD       Future Appointments             In 3 weeks Hoy Register, MD [facility]

## 2021-08-11 NOTE — Telephone Encounter (Signed)
Refused, filled yesterday, duplicate request. ° °Requested Prescriptions  °Pending Prescriptions Disp Refills  ° hydrOXYzine (ATARAX) 10 MG tablet [Pharmacy Med Name: hydroxyzine HCl 10 mg tablet] 60 tablet 3  °  Sig: Take 1 tablet (10 mg total) by mouth 3 (three) times daily as needed.  °  ° Ear, Nose, and Throat:  Antihistamines 2 Passed - 08/10/2021 10:27 AM  °  °  Passed - Cr in normal range and within 360 days  °  Creat  °Date Value Ref Range Status  °07/05/2016 0.59 0.50 - 1.05 mg/dL Final  °  Comment:  °    °For patients > or = 60 years of age: The upper reference limit for °Creatinine is approximately 13% higher for people identified as °African-American. °  °  ° °Creatinine, Ser  °Date Value Ref Range Status  °06/09/2021 0.57 0.44 - 1.00 mg/dL Final  °  °  °  °  Passed - Valid encounter within last 12 months  °  Recent Outpatient Visits   ° °      ° 2 months ago Type 2 diabetes mellitus with other neurologic complication, with long-term current use of insulin (HCC)  ° [facility] [place], Enobong, MD  ° 5 months ago Annual physical exam  ° [place] [facility] [place], Enobong, MD  ° 6 months ago Trigger ring finger of right hand  ° [facility] [place], Enobong, MD  ° 1 year ago Type 2 diabetes mellitus with hyperglycemia, with long-term current use of insulin (HCC)  ° [facility] [place], Enobong, MD  ° 1 year ago Type 2 diabetes mellitus with hyperglycemia, with long-term current use of insulin (HCC)  ° [place] [facility] [place], Enobong, MD  ° °  °  °Future Appointments   ° °        ° In 3 weeks [place], Enobong, MD [facility]  ° °  ° °  °  °  ° ° °

## 2021-08-12 ENCOUNTER — Telehealth: Payer: Self-pay | Admitting: Nutrition

## 2021-08-12 ENCOUNTER — Telehealth: Payer: Self-pay

## 2021-08-12 NOTE — Telephone Encounter (Signed)
While sceduling her Dexcom training, the patient reports that Dr. Lonzo Cloud had raised her insulin dose, and she will be out of both her Lantus and Novolog insulins tomorrow.  "Please have someone call in her new insulin doses to the pharmacy so that she can pick up some insulin.  She will be out tomorrow!

## 2021-08-12 NOTE — Telephone Encounter (Signed)
Spoke with [name] pharmacy and they have script from 08/05/21 and will get ready for patient.

## 2021-08-13 NOTE — Telephone Encounter (Signed)
Spoke with patient and Renaee Munda should be sending medication out today.

## 2021-08-17 ENCOUNTER — Encounter: Payer: Medicaid Other | Admitting: Nutrition

## 2021-08-26 ENCOUNTER — Other Ambulatory Visit: Payer: Self-pay | Admitting: Family Medicine

## 2021-08-26 DIAGNOSIS — H8113 Benign paroxysmal vertigo, bilateral: Secondary | ICD-10-CM

## 2021-08-26 NOTE — Telephone Encounter (Signed)
Requested medications are due for refill today.  yes ? ?Requested medications are on the active medications list.  yes ? ?Last refill. Meclizine 25 05/19/2021 #180 0 refills,  Vit D 03/11/2021 12 1 refill. ? ?Future visit scheduled.   yes ? ?Notes to clinic.  Medications not delegated. ? ? ? ?Requested Prescriptions  ?Pending Prescriptions Disp Refills  ? meclizine (ANTIVERT) 25 MG tablet [Pharmacy Med Name: meclizine 25 mg tablet] 180 tablet 0  ?  Sig: TAKE ONE TABLET BY MOUTH THREE TIMES DAILY AS NEEDED FOR DIZZINESS  ?  ? Not Delegated - Gastroenterology: Antiemetics Failed - 08/26/2021 11:05 AM  ?  ?  Failed - This refill cannot be delegated  ?  ?  Passed - Valid encounter within last 6 months  ?  Recent Outpatient Visits   ? ?      ? 2 months ago Type 2 diabetes mellitus with other neurologic complication, with long-term current use of insulin (HCC)  ? [facility] Hoy Register, MD  ? 5 months ago Annual physical exam  ? [facility] [place], Odette Horns, MD  ? 7 months ago Trigger ring finger of right hand  ? [place] [facility] [place], Odette Horns, MD  ? 1 year ago Type 2 diabetes mellitus with hyperglycemia, with long-term current use of insulin (HCC)  ? [place] [facility] [place], Odette Horns, MD  ? 1 year ago Type 2 diabetes mellitus with hyperglycemia, with long-term current use of insulin (HCC)  ? [facility] Hoy Register, MD  ? ?  ?  ?Future Appointments   ? ?        ? In 1 week Hoy Register, MD [facility]  ? ?  ? ?  ?  ?  ? Vitamin D, Ergocalciferol, (DRISDOL) 1.25 MG (50000 UNIT) CAPS capsule [Pharmacy Med Name: ergocalciferol (vitamin D2) 1,250 mcg (50,000 unit) capsule] 12 capsule 1  ?  Sig: Take 1 capsule (50,000 Units total) by mouth once a week.  ?  ? Endocrinology:  Vitamins - Vitamin D Supplementation 2 Failed - 08/26/2021 11:05 AM  ?  ?   Failed - Manual Review: Route requests for 50,000 IU strength to the provider  ?  ?  Failed - Vitamin D in normal range and within 360 days  ?  Vit D, 25-Hydroxy  ?Date Value Ref Range Status  ?03/09/2021 22.3 (L) 30.0 - 100.0 ng/mL Final  ?  Comment:  ?  Vitamin D deficiency has been defined by the Institute of ?Medicine and an Endocrine Society practice guideline as a ?level of serum 25-OH vitamin D less than 20 ng/mL (1,2). ?The Endocrine Society went on to further define vitamin D ?insufficiency as a level between 21 and 29 ng/mL (2). ?1. IOM (Institute of Medicine). 2010. Dietary reference ?   intakes for calcium and D. [place]: The ?   [employer]. ?2. Holick MF, [place], Bischoff-Ferrari HA, et al. ?   Evaluation, treatment, and prevention of vitamin D ?   deficiency: an Endocrine Society clinical practice ?   guideline. JCEM. 2011 Jul; 96(7):1911-30. ?  ?  ?  ?  ?  Passed - Ca in normal range and within 360 days  ?  Calcium  ?Date Value Ref Range Status  ?06/09/2021 9.4 8.9 - 10.3 mg/dL Final  ? ?Calcium, Ion  ?Date Value Ref Range Status  ?  04/05/2009 1.11 (L) 1.12 - 1.32 mmol/L Final  ?  ?  ?  ?  Passed - Valid encounter within last 12 months  ?  Recent Outpatient Visits   ? ?      ? 2 months ago Type 2 diabetes mellitus with other neurologic complication, with long-term current use of insulin (HCC)  ? [facility] Hoy Register, MD  ? 5 months ago Annual physical exam  ? [facility] [facility] [place], Odette Horns, MD  ? 7 months ago Trigger ring finger of right hand  ? [place] [facility] [place], Odette Horns, MD  ? 1 year ago Type 2 diabetes mellitus with hyperglycemia, with long-term current use of insulin (HCC)  ?  [facility] [place], Odette Horns, MD  ? 1 year ago Type 2 diabetes mellitus with hyperglycemia, with long-term current use of insulin (HCC)  ? [facility]  Hoy Register, MD  ? ?  ?  ?Future Appointments   ? ?        ? In 1 week Hoy Register, MD [facility]  ? ?  ? ?  ?  ?  ?  ?

## 2021-08-30 ENCOUNTER — Emergency Department (HOSPITAL_COMMUNITY)
Admission: EM | Admit: 2021-08-30 | Discharge: 2021-08-30 | Disposition: A | Discharge status: Home / Self Care | Payer: Medicaid Other | Attending: Emergency Medicine | Admitting: Emergency Medicine

## 2021-08-30 ENCOUNTER — Other Ambulatory Visit: Payer: Self-pay

## 2021-08-30 ENCOUNTER — Emergency Department (HOSPITAL_COMMUNITY): Payer: Medicaid Other

## 2021-08-30 ENCOUNTER — Encounter (HOSPITAL_COMMUNITY): Payer: Self-pay | Admitting: Emergency Medicine

## 2021-08-30 DIAGNOSIS — Z79899 Other long term (current) drug therapy: Secondary | ICD-10-CM | POA: Diagnosis not present

## 2021-08-30 DIAGNOSIS — R079 Chest pain, unspecified: Secondary | ICD-10-CM

## 2021-08-30 DIAGNOSIS — Z794 Long term (current) use of insulin: Secondary | ICD-10-CM | POA: Diagnosis not present

## 2021-08-30 DIAGNOSIS — R0789 Other chest pain: Secondary | ICD-10-CM | POA: Insufficient documentation

## 2021-08-30 DIAGNOSIS — Z91040 Latex allergy status: Secondary | ICD-10-CM | POA: Diagnosis not present

## 2021-08-30 LAB — CBC
HCT: 45 % (ref 36.0–46.0)
Hemoglobin: 14.9 g/dL (ref 12.0–15.0)
MCH: 29.2 pg (ref 26.0–34.0)
MCHC: 33.1 g/dL (ref 30.0–36.0)
MCV: 88.2 fL (ref 80.0–100.0)
Platelets: 175 10*3/uL (ref 150–400)
RBC: 5.1 MIL/uL (ref 3.87–5.11)
RDW: 12.4 % (ref 11.5–15.5)
WBC: 10 10*3/uL (ref 4.0–10.5)
nRBC: 0 % (ref 0.0–0.2)

## 2021-08-30 LAB — BASIC METABOLIC PANEL
Anion gap: 9 (ref 5–15)
BUN: 9 mg/dL (ref 6–20)
CO2: 23 mmol/L (ref 22–32)
Calcium: 9.2 mg/dL (ref 8.9–10.3)
Chloride: 104 mmol/L (ref 98–111)
Creatinine, Ser: 0.54 mg/dL (ref 0.44–1.00)
GFR, Estimated: >60 mL/min (ref >60)
Glucose, Bld: 239 mg/dL — ABNORMAL HIGH (ref 70–99)
Potassium: 3.7 mmol/L (ref 3.5–5.1)
Sodium: 136 mmol/L (ref 135–145)

## 2021-08-30 LAB — TROPONIN I (HIGH SENSITIVITY): Troponin I (High Sensitivity): 4 ng/L (ref <18)

## 2021-08-30 LAB — I-STAT BETA HCG BLOOD, ED (MC, WL, AP ONLY): I-stat hCG, quantitative: <5 m[IU]/mL (ref <5)

## 2021-08-30 NOTE — ED Provider Notes (Signed)
[facility] EMERGENCY DEPARTMENT Provider Note   CSN: [id number] Arrival date & time: 08/30/21  1335     History  Chief Complaint  Patient presents with   Chest Pain    [name] is a 61 y.o. female.   Chest Pain Associated symptoms: no abdominal pain, no fever and no weakness   Patient Zentz with chest pain.  Woke up this morning.  Anterior lower chest on left.  Feels that it is a sharp Taser like pain.  Will come and go.  Only lasts a second or 2.  Not exertional.   States she does have some pain on the right side of her chest from when her dog had stepped on her a few days ago.  States that is worse with palpation.  Worse with movement.  No swelling of legs.  No cough.  Pains are not exertional.    Home Medications Prior to Admission medications   Medication Sig Start Date End Date Taking? Authorizing Provider  Accu-Chek Softclix Lancets lancets Use to check blood sugar THREE TIMES DAILY Patient taking differently: 1 each by Other route See admin instructions. Use to check blood sugar THREE TIMES DAILY 06/09/21   Truddie Hidden, MD  albuterol (VENTOLIN HFA) 108 (90 Base) MCG/ACT inhaler Inhale 2 puffs into the lungs every 4 (four) hours as needed for wheezing or shortness of breath. 06/09/21   Charlott Rakes, MD  Blood Glucose Monitoring Suppl (ACCU-CHEK GUIDE ME) w/Device KIT Use to check blood sugar TID. 01/30/21   Charlott Rakes, MD  Continuous Blood Gluc Receiver (DEXCOM G6 RECEIVER) DEVI 1 Device by Does not apply route as directed. 07/29/21   Shamleffer, Melanie Crazier, MD  Continuous Blood Gluc Sensor (DEXCOM G6 SENSOR) MISC 1 Device by Does not apply route as directed. 07/29/21   Shamleffer, Melanie Crazier, MD  Continuous Blood Gluc Transmit (DEXCOM G6 TRANSMITTER) MISC 1 Device by Does not apply route as directed. 07/29/21   Shamleffer, Melanie Crazier, MD  dicyclomine (BENTYL) 10 MG capsule Take 1 capsule (10 mg total) by mouth 3 (three) times  daily before meals. Patient taking differently: Take 10 mg by mouth in the morning and at bedtime. 04/14/21   Charlott Rakes, MD  diphenoxylate-atropine (LOMOTIL) 2.5-0.025 MG tablet Take 1 tablet by mouth 2 (two) times daily. 07/03/21   Charlott Rakes, MD  ergocalciferol (DRISDOL) 1.25 MG (50000 UT) capsule Take 1 capsule (50,000 Units total) by mouth once a week. Patient taking differently: Take 50,000 Units by mouth once a week. Fridays 03/11/21   Charlott Rakes, MD  esomeprazole (NEXIUM) 20 MG capsule Take 1 capsule (20 mg total) by mouth daily. 06/09/21   Charlott Rakes, MD  fenofibrate (TRICOR) 145 MG tablet Take 1 tablet (145 mg total) by mouth daily. 06/12/21   Charlott Rakes, MD  FLUoxetine (PROZAC) 40 MG capsule Take 1 capsule (40 mg total) by mouth at bedtime. 06/09/21   Charlott Rakes, MD  gabapentin (NEURONTIN) 300 MG capsule Take 2 capsules (600 mg total) by mouth 2 (two) times daily. 07/23/21   Charlott Rakes, MD  glucose blood (ACCU-CHEK GUIDE) test strip 1 each by Other route in the morning, at noon, in the evening, and at bedtime. Use as instructed 07/29/21   Shamleffer, Melanie Crazier, MD  hydrOXYzine (ATARAX) 10 MG tablet Take 1 tablet (10 mg total) by mouth 3 (three) times daily as needed. Patient taking differently: Take 10 mg by mouth 2 (two) times daily. 06/09/21   Charlott Rakes,  MD  insulin aspart (NOVOLOG FLEXPEN) 100 UNIT/ML FlexPen Max Daily 50 units per correction scale 07/30/21   Shamleffer, Melanie Crazier, MD  insulin glargine (LANTUS SOLOSTAR) 100 UNIT/ML Solostar Pen Inject 24 Units into the skin daily. 07/30/21   Shamleffer, Melanie Crazier, MD  Insulin Pen Needle 32G X 4 MM MISC 1 Device by Does not apply route 4 (four) times daily. 07/30/21   Shamleffer, Melanie Crazier, MD  Lancet Devices [facility]) lancets Use as instructed daily. 11/24/16   Charlott Rakes, MD  meclizine (ANTIVERT) 25 MG tablet TAKE ONE TABLET BY MOUTH THREE TIMES DAILY AS NEEDED FOR  DIZZINESS Patient taking differently: Take 25 mg by mouth 3 (three) times daily as needed for dizziness. 08/26/21   Charlott Rakes, MD  metoprolol succinate (TOPROL-XL) 25 MG 24 hr tablet Take 1 tablet (25 mg total) by mouth daily. 06/09/21   Charlott Rakes, MD  rosuvastatin (CRESTOR) 10 MG tablet Take 1 tablet (10 mg total) by mouth daily. 06/09/21   Charlott Rakes, MD      Allergies    Biaxin [clarithromycin], Clarithromycin, Lisinopril, Sulfa antibiotics, Chlorthalidone, Daptomycin, Latex, Amoxicillin-pot clavulanate, Aspirin, Cholestyramine, Dilaudid [hydromorphone hcl], and Lasix [furosemide]    Review of Systems   Review of Systems  Constitutional:  Negative for fever.  HENT:  Negative for congestion.   Cardiovascular:  Positive for chest pain.  Gastrointestinal:  Negative for abdominal pain.  Genitourinary:  Negative for flank pain.  Neurological:  Negative for weakness.   Physical Exam Updated Vital Signs BP 113/71    Pulse 71    Temp 98.4 F (36.9 C) (Oral)    Resp (!) 21    LMP 07/24/2011    SpO2 96%  Physical Exam Vitals and nursing note reviewed.  Constitutional:      Appearance: She is well-developed.  Cardiovascular:     Rate and Rhythm: Normal rate.  Pulmonary:     Effort: No tachypnea.     Breath sounds: No stridor. No wheezing.  Chest:     Chest wall: Tenderness present.     Comments: Tenderness to lateral right somewhat lower chest wall.  No crepitance or deformity.  No rash. Abdominal:     Tenderness: There is no abdominal tenderness.  Musculoskeletal:     Cervical back: Neck supple.     Right lower leg: No edema.     Left lower leg: No edema.  Neurological:     Mental Status: She is alert.    ED Results / Procedures / Treatments   Labs (all labs ordered are listed, but only abnormal results are displayed) Labs Reviewed  BASIC METABOLIC PANEL - Abnormal; Notable for the following components:      Result Value   Glucose, Bld 239 (*)    All other  components within normal limits  CBC  I-STAT BETA HCG BLOOD, ED ([facility], [facility], [facility] ONLY)  TROPONIN I (HIGH SENSITIVITY)    EKG EKG Interpretation  Date/Time:  Sunday August 30 2021 13:44:48 EST Ventricular Rate:  85 PR Interval:  150 QRS Duration: 118 QT Interval:  422 QTC Calculation: 502 R Axis:   -11 Text Interpretation: Normal sinus rhythm Left ventricular hypertrophy with QRS widening and repolarization abnormality ( Cornell product ) Prolonged QT Abnormal ECG When compared with ECG of 09-Jun-2021 14:59, PREVIOUS ECG IS PRESENT No significant change since last tracing Confirmed by Regan Lemming ([id number]) on 08/30/2021 1:50:16 PM  Radiology DG Chest 2 View  Result Date: 08/30/2021 CLINICAL DATA:  Chest pain  EXAM: CHEST - 2 VIEW COMPARISON:  Chest radiograph dated June 09, 2021 FINDINGS: The heart size and mediastinal contours are within normal limits. Atherosclerotic calcification of the aortic arch. Both lungs are clear. The visualized skeletal structures are unremarkable. IMPRESSION: No active cardiopulmonary disease. Electronically Signed   By: Keane Police D.O.   On: 08/30/2021 14:24    Procedures Procedures    Medications Ordered in ED Medications - No data to display  ED Course/ Medical Decision Making/ A&P                           Medical Decision Making Amount and/or Complexity of Data Reviewed Labs: ordered. Radiology: ordered.   Patient with chest pain.  Both having left sharp chest pain and right-sided pain that is reproducible.  No EKG changes.  Chest x-ray independently interpreted and showed no pneumonia or rib fracture.  Did have some trauma to the chest on the right side previously.  No abdominal tenderness doubt liver injury.  With the brief pain on the left side of the chest that will only last a second or 2 and go away doubt this is cardiac ischemia as a cause.  Doubt pulm embolism.  Appears stable for discharge home.  Outpatient follow-up with PCP as  needed. Initial differential diagnosis for chest pain is broad and includes life-threatening conditions such as cardiac ischemia.  I reviewed PCPs previous office visit note        Final Clinical Impression(s) / ED Diagnoses Final diagnoses:  Nonspecific chest pain    Rx / DC Orders ED Discharge Orders     None         Davonna Belling, MD 08/30/21 1524

## 2021-08-30 NOTE — ED Triage Notes (Signed)
Woke up this morning with diarrhea, nausea, and feeling like L side of chest was being tazed.  States chest feels heavy now with SOB.  Pt states she believes symptoms may be related to her 50 lb german shepherd jumping on her R ribs a few days ago. ?

## 2021-08-31 ENCOUNTER — Telehealth: Payer: Self-pay | Admitting: Nutrition

## 2021-09-04 ENCOUNTER — Other Ambulatory Visit: Payer: Self-pay | Admitting: Family Medicine

## 2021-09-04 NOTE — Telephone Encounter (Signed)
Requested Prescriptions  ?Pending Prescriptions Disp Refills  ?? VENTOLIN HFA 108 (90 Base) MCG/ACT inhaler [Pharmacy Med Name: Ventolin HFA 90 mcg/actuation aerosol inhaler] 18 g 2  ?  Sig: Inhale 2 puffs into the lungs every 4 (four) hours as needed for wheezing or shortness of breath.  ?  ? Pulmonology:  Beta Agonists 2 Passed - 09/04/2021  9:58 AM  ?  ?  Passed - Last BP in normal range  ?  BP Readings from Last 1 Encounters:  ?08/30/21 113/71  ?   ?  ?  Passed - Last Heart Rate in normal range  ?  Pulse Readings from Last 1 Encounters:  ?08/30/21 71  ?   ?  ?  Passed - Valid encounter within last 12 months  ?  Recent Outpatient Visits   ?      ? 2 months ago Type 2 diabetes mellitus with other neurologic complication, with long-term current use of insulin (HCC)  ? [facility] Hoy Register, MD  ? 5 months ago Annual physical exam  ? [facility] [place], Odette Horns, MD  ? 7 months ago Trigger ring finger of right hand  ? [facility] [place], Odette Horns, MD  ? 1 year ago Type 2 diabetes mellitus with hyperglycemia, with long-term current use of insulin (HCC)  ? [facility] [place], Odette Horns, MD  ? 1 year ago Type 2 diabetes mellitus with hyperglycemia, with long-term current use of insulin (HCC)  ? [facility] Hoy Register, MD  ?  ?  ?Future Appointments   ?        ? In 3 days Hoy Register, MD [facility]  ?  ? ?  ?  ?  ? ?

## 2021-09-07 ENCOUNTER — Other Ambulatory Visit: Payer: Self-pay

## 2021-09-07 ENCOUNTER — Encounter: Payer: Medicaid Other | Admitting: Nutrition

## 2021-09-07 ENCOUNTER — Ambulatory Visit: Payer: Medicaid Other | Attending: Family Medicine | Admitting: Family Medicine

## 2021-09-07 ENCOUNTER — Encounter: Payer: Self-pay | Admitting: Family Medicine

## 2021-09-07 VITALS — BP 120/80 | HR 69 | Ht 67.0 in | Wt 138.0 lb

## 2021-09-07 DIAGNOSIS — E1159 Type 2 diabetes mellitus with other circulatory complications: Secondary | ICD-10-CM

## 2021-09-07 DIAGNOSIS — E1149 Type 2 diabetes mellitus with other diabetic neurological complication: Secondary | ICD-10-CM

## 2021-09-07 DIAGNOSIS — M940 Chondrocostal junction syndrome [Tietze]: Secondary | ICD-10-CM

## 2021-09-07 DIAGNOSIS — Z794 Long term (current) use of insulin: Secondary | ICD-10-CM

## 2021-09-07 DIAGNOSIS — I5022 Chronic systolic (congestive) heart failure: Secondary | ICD-10-CM

## 2021-09-07 DIAGNOSIS — R6889 Other general symptoms and signs: Secondary | ICD-10-CM

## 2021-09-07 DIAGNOSIS — E559 Vitamin D deficiency, unspecified: Secondary | ICD-10-CM

## 2021-09-07 DIAGNOSIS — I152 Hypertension secondary to endocrine disorders: Secondary | ICD-10-CM

## 2021-09-07 DIAGNOSIS — Z1211 Encounter for screening for malignant neoplasm of colon: Secondary | ICD-10-CM

## 2021-09-07 MED ORDER — CETIRIZINE HCL 10 MG PO TABS: 10.0000 mg | ORAL_TABLET | Freq: Every day | ORAL | 1 refills | Status: DC

## 2021-09-07 NOTE — Patient Instructions (Signed)
Costochondritis Costochondritis is irritation and swelling (inflammation) of the tissue that connects the ribs to the breastbone (sternum). This tissue is called cartilage. Costochondritis causes pain in the front of the chest. Usually, the pain: Starts slowly. Is in more than one rib. What are the causes? The exact cause of this condition is not always known. It results from stress on the tissue in the affected area. The cause of this stress could be: Chest injury. Exercise or activity, such as lifting. Very bad coughing. What increases the risk? You are more likely to develop this condition if you: Are female. Are 30-40 years old. Recently started a new exercise or work activity. Have low levels of vitamin D. Have a condition that makes you cough often. What are the signs or symptoms? The main symptom of this condition is chest pain. The pain: Usually starts slowly and can be sharp or dull. Gets worse with deep breathing, coughing, or exercise. Gets better with rest. May be worse when you press on the affected area of your ribs and breastbone. How is this treated? This condition usually goes away on its own over time. Your doctor may prescribe an NSAID, such as ibuprofen. This can help reduce pain and inflammation. Treatment may also include: Resting and avoiding activities that make pain worse. Putting heat or ice on the painful area. Doing exercises to stretch your chest muscles. If these treatments do not help, your doctor may inject a numbing medicine to help relieve the pain. Follow these instructions at home: Managing pain, stiffness, and swelling   If told, put ice on the painful area. To do this: Put ice in a plastic bag. Place a towel between your skin and the bag. Leave the ice on for 20 minutes, 2-3 times a day. If told, put heat on the affected area. Do this as often as told by your doctor. Use the heat source that your doctor recommends, such as a moist heat pack or  a heating pad. Place a towel between your skin and the heat source. Leave the heat on for 20-30 minutes. Take off the heat if your skin turns bright red. This is very important if you cannot feel pain, heat, or cold. You may have a greater risk of getting burned. Activity Rest as told by your doctor. Do not do anything that makes your pain worse. This includes any activities that use chest, belly (abdomen), and side muscles. Do not lift anything that is heavier than 10 lb (4.5 kg), or the limit that you are told, until your doctor says that it is safe. Return to your normal activities as told by your doctor. Ask your doctor what activities are safe for you. General instructions Take over-the-counter and prescription medicines only as told by your doctor. Keep all follow-up visits as told by your doctor. This is important. Contact a doctor if: You have chills or a fever. Your pain does not go away or it gets worse. You have a cough that does not go away. Get help right away if: You are short of breath. You have very bad chest pain that is not helped by medicines, heat, or ice. These symptoms may be an emergency. Do not wait to see if the symptoms will go away. Get medical help right away. Call your local emergency services (911 in the [place] Do not drive yourself to the hospital. Summary Costochondritis is irritation and swelling (inflammation) of the tissue that connects the ribs to the breastbone (sternum). This condition   causes pain in the front of the chest. Treatment may include medicines, rest, heat or ice, and exercises. This information is not intended to replace advice given to you by your health care provider. Make sure you discuss any questions you have with your health care provider. Document Revised: 04/27/2019 Document Reviewed: 04/27/2019 Else[employer] Patient Education  2022 [employer]  

## 2021-09-07 NOTE — Progress Notes (Signed)
Subjective:  Patient ID: [name], female    DOB: 07-Sep-1960  Age: 61 y.o. MRN: [id number]  CC: Hypertension   HPI [name] is a 61 y.o. year old female with a history of type 2 diabetes mellitus (A1c >13.9), hypertension, CHF (EF 55-60%  2-D echo of 09/2016 which has improved from 25%-30% previously), anxiety, GERD, COVID-19 in 01/2020, greater than 20-pack-year history. Last visit to endocrine was last month and she is currently being worked up for an insulin pump.  She reports a single episode of hypoglycemia of 40 but she did not inform her endocrinologist about this.  She had to eat just some chocolate to bring her sugars up.  She has had no repeat episodes  Interval History: She has pain in her bilateral subcostal region and also hurts in her thoracic region . The thoracic back pain feels like a pinching pain. The subcostal pain feels  like a 7/10 and gets worse up to a 10/10. Sitting straight up makes it better. Pushing against it also makes her short of breath. She had an ED visit for same and troponin was negative, chest x-ray negative for acute cardiopulmonary process.  EKG revealed LVH, prolonged QTc of 502.  Raising her hands above her head makes her dyspneic and she has to return her hands to the anatomical position for symptoms to resolve. Symptoms have been present for the last couple of weeks. She sleeps on 2 pillows and has plans of decreased exercise tolerance, weight gain or pedal edema and she has no orthopnea.  Her ears feel like she is speaking in a tunnel but she denies sinus symptoms or postnasal drip. She plans to quit smoking tomorrow on her birthday.  Past Medical History:  Diagnosis Date   Anxiety    Arthritis    Chronic systolic heart failure (HCC)    EF 20-25% ECHO 05/2015   COPD (chronic obstructive pulmonary disease) (HCC)    Depression    Diabetes mellitus without complication (HCC)    Type II   Dysrhythmia    pt unsure of name of  arrythmia - " my heart rate will drop all of a sudden" -no current treatment - atrial flutter   Fibrillation, atrial (HCC)    Gallstones    GERD (gastroesophageal reflux disease)    Headache(784.0)    migraines   Hypertension    Osteomyelitis (HCC)    R ankle   Rash    arms    Past Surgical History:  Procedure Laterality Date   ANKLE FRACTURE SURGERY Right 2015   CARDIAC CATHETERIZATION N/A 06/24/2015   Procedure: Left Heart Cath and Coronary Angiography;  Surgeon: [name], MD;  Location: [facility] INV[facility] CV LAB;  Service: Cardiovascular;  Laterality: N/A;   CARPAL TUNNEL RELEASE     bil   CHOLECYSTECTOMY  11/24/2011   Procedure: LAPAROSCOPIC CHOLECYSTECTOMY WITH INTRAOPERATIVE CHOLANGIOGRAM;  Surgeon: Clovis Pu. Cornett, MD;  Location: [facility] ORS;  Service: General;  Laterality: N/A;  Laparoscopic Cholecystectomy with Cholangiogram   COLONOSCOPY     ECTOPIC PREGNANCY SURGERY  many yrs ago   HARDWARE REMOVAL Right 07/15/2015   Procedure: RIGHT ANKLE HARDWARE REMOVAL, PLACEMENT OF STIMULAN ANTIBIOTIC BEADS AND PREVENA WOUND VAC.;  Surgeon: [name], MD;  Location: [facility] OR;  Service: Orthopedics;  Laterality: Right;   TOOTH EXTRACTION N/A 12/03/2016   Procedure: DENTAL EXTRACTIONS with Alveoloplasty;  Surgeon: Ocie Doyne, DDS;  Location: [facility] OR;  Service: Oral Surgery;  Laterality: N/A;  Family History  Problem Relation Age of Onset   Diabetes Mother    Lung cancer Father    Heart attack Father 58       CABG x4   Breast cancer Sister        Survivor     Social History   Socioeconomic History   Marital status: Divorced    Spouse name: Not on file   Number of children: Not on file   Years of education: Not on file   Highest education level: Not on file  Occupational History   Not on file  Tobacco Use   Smoking status: Every Day    Packs/day: 1.00    Years: 20.00    Pack years: 20.00    Types: Cigarettes, E-cigarettes    Start date: 09/19/2016   Smokeless  tobacco: Never  Vaping Use   Vaping Use: Every day   Start date: 10/12/2016  Substance and Sexual Activity   Alcohol use: No    Alcohol/week: 0.0 standard drinks   Drug use: No   Sexual activity: Not Currently    Birth control/protection: Post-menopausal  Other Topics Concern   Not on file  Social History Narrative   Not on file   Social Determinants of Health   Financial Resource Strain: Not on file  Food Insecurity: Not on file  Transportation Needs: Not on file  Physical Activity: Not on file  Stress: Not on file  Social Connections: Not on file    Allergies  Allergen Reactions   Biaxin [Clarithromycin] Anaphylaxis and Swelling   Clarithromycin Shortness Of Breath and Swelling   Lisinopril Swelling and Cough    Lip edema   Sulfa Antibiotics Anaphylaxis, Shortness Of Breath, Swelling and Hypertension   Chlorthalidone Nausea And Vomiting and Other (See Comments)    Clammy, Tachycardia, Headache    Daptomycin Nausea And Vomiting   Latex Hives and Rash   Amoxicillin-Pot Clavulanate Diarrhea    Has patient had a PCN reaction causing immediate rash, facial/tongue/throat swelling, SOB or lightheadedness with hypotension:No Has patient had a PCN reaction causing severe rash involving mucus membranes or skin necrosis:No Has patient had a PCN reaction that required hospitalization:No Has patient had a PCN reaction occurring within THE LAST 10 YEARS.  #  #  #  YES  #  #  #  If all of the above answers are "NO", then may proceed with Cephalosporin use.    Aspirin Nausea And Vomiting and Rash    On $R'325mg'aK$  dosage   Cholestyramine Nausea And Vomiting   Dilaudid [Hydromorphone Hcl] Nausea And Vomiting   Lasix [Furosemide] Nausea And Vomiting and Other (See Comments)    Headache     Outpatient Medications Prior to Visit  Medication Sig Dispense Refill   Accu-Chek Softclix Lancets lancets Use to check blood sugar THREE TIMES DAILY (Patient taking differently: 1 each by Other  route See admin instructions. Use to check blood sugar THREE TIMES DAILY) 100 each 2   Blood Glucose Monitoring Suppl (ACCU-CHEK GUIDE ME) w/Device KIT Use to check blood sugar TID. 1 kit 0   Continuous Blood Gluc Receiver (DEXCOM G6 RECEIVER) DEVI 1 Device by Does not apply route as directed. 1 each 0   Continuous Blood Gluc Sensor (DEXCOM G6 SENSOR) MISC 1 Device by Does not apply route as directed. 9 each 3   Continuous Blood Gluc Transmit (DEXCOM G6 TRANSMITTER) MISC 1 Device by Does not apply route as directed. 1 each 3   dicyclomine (BENTYL)  10 MG capsule Take 1 capsule (10 mg total) by mouth 3 (three) times daily before meals. (Patient taking differently: Take 10 mg by mouth in the morning and at bedtime.) 270 capsule 1   diphenoxylate-atropine (LOMOTIL) 2.5-0.025 MG tablet Take 1 tablet by mouth 2 (two) times daily. 30 tablet 1   esomeprazole (NEXIUM) 20 MG capsule Take 1 capsule (20 mg total) by mouth daily. 90 capsule 1   fenofibrate (TRICOR) 145 MG tablet Take 1 tablet (145 mg total) by mouth daily. 90 tablet 1   FLUoxetine (PROZAC) 40 MG capsule Take 1 capsule (40 mg total) by mouth at bedtime. 90 capsule 1   gabapentin (NEURONTIN) 300 MG capsule Take 2 capsules (600 mg total) by mouth 2 (two) times daily. 180 capsule 1   glucose blood (ACCU-CHEK GUIDE) test strip 1 each by Other route in the morning, at noon, in the evening, and at bedtime. Use as instructed 400 each 3   hydrOXYzine (ATARAX) 10 MG tablet Take 1 tablet (10 mg total) by mouth 3 (three) times daily as needed. (Patient taking differently: Take 10 mg by mouth 2 (two) times daily.) 60 tablet 3   insulin aspart (NOVOLOG FLEXPEN) 100 UNIT/ML FlexPen Max Daily 50 units per correction scale 45 mL 2   insulin glargine (LANTUS SOLOSTAR) 100 UNIT/ML Solostar Pen Inject 24 Units into the skin daily. 30 mL 2   Insulin Pen Needle 32G X 4 MM MISC 1 Device by Does not apply route 4 (four) times daily. 400 each 2   Lancet Devices  (ACCU-CHEK SOFTCLIX) lancets Use as instructed daily. 1 each 5   meclizine (ANTIVERT) 25 MG tablet TAKE ONE TABLET BY MOUTH THREE TIMES DAILY AS NEEDED FOR DIZZINESS (Patient taking differently: Take 25 mg by mouth 3 (three) times daily as needed for dizziness.) 90 tablet 0   metoprolol succinate (TOPROL-XL) 25 MG 24 hr tablet Take 1 tablet (25 mg total) by mouth daily. 90 tablet 1   rosuvastatin (CRESTOR) 10 MG tablet Take 1 tablet (10 mg total) by mouth daily. 90 tablet 1   VENTOLIN HFA 108 (90 Base) MCG/ACT inhaler Inhale 2 puffs into the lungs every 4 (four) hours as needed for wheezing or shortness of breath. 18 g 2   ergocalciferol (DRISDOL) 1.25 MG (50000 UT) capsule Take 1 capsule (50,000 Units total) by mouth once a week. (Patient not taking: Reported on 09/07/2021) 12 capsule 1   Facility-Administered Medications Prior to Visit  Medication Dose Route Frequency Provider Last Rate Last Admin   regadenoson (LEXISCAN) injection SOLN 0.4 mg  0.4 mg Intravenous Once Dorothy Spark, MD       technetium tetrofosmin (TC-MYOVIEW) injection 63.8 millicurie  45.3 millicurie Intravenous Once PRN Dorothy Spark, MD         ROS Review of Systems  Constitutional:  Negative for activity change, appetite change and fatigue.  HENT:  Negative for congestion, sinus pressure and sore throat.   Eyes:  Negative for visual disturbance.  Respiratory:  Negative for cough, chest tightness, shortness of breath and wheezing.   Cardiovascular:  Positive for chest pain. Negative for palpitations.  Gastrointestinal:  Negative for abdominal distention, abdominal pain and constipation.  Endocrine: Negative for polydipsia.  Genitourinary:  Negative for dysuria and frequency.  Musculoskeletal:  Negative for arthralgias and back pain.  Skin:  Negative for rash.  Neurological:  Negative for tremors, light-headedness and numbness.  Hematological:  Does not bruise/bleed easily.  Psychiatric/Behavioral:  Negative  for agitation and  behavioral problems.    Objective:  BP 120/80    Pulse 69    Ht $R'5\' 7"'mI$  (1.702 m)    Wt 138 lb (62.6 kg)    LMP 07/24/2011    SpO2 98%    BMI 21.61 kg/m   BP/Weight 09/07/2021 08/28/2023 09/27/7060  Systolic BP 376 283 151  Diastolic BP 80 71 70  Wt. (Lbs) 138 - 134  BMI 21.61 - 20.99      Physical Exam Constitutional:      Appearance: She is well-developed.  HENT:     Right Ear: There is no impacted cerumen.     Left Ear: There is impacted cerumen.  Neck:     Vascular: No carotid bruit.  Cardiovascular:     Rate and Rhythm: Normal rate.     Heart sounds: Normal heart sounds. No murmur heard. Pulmonary:     Effort: Pulmonary effort is normal.     Breath sounds: Normal breath sounds. No wheezing or rales.  Chest:     Chest wall: Tenderness (reproducible chestwall pain in subcostal region) present.  Abdominal:     General: Bowel sounds are normal. There is no distension.     Palpations: Abdomen is soft. There is no mass.     Tenderness: There is no abdominal tenderness.  Musculoskeletal:        General: Normal range of motion.     Right lower leg: No edema.     Left lower leg: No edema.  Neurological:     Mental Status: She is alert and oriented to person, place, and time.  Psychiatric:        Mood and Affect: Mood normal.    CMP Latest Ref Rng & Units 08/30/2021 06/09/2021 10/25/2020  Glucose 70 - 99 mg/dL 239(H) 486(H) 497(H)  BUN 6 - 20 mg/dL $Remove'9 10 11  'JuAklGa$ Creatinine 0.44 - 1.00 mg/dL 0.54 0.57 0.64  Sodium 135 - 145 mmol/L 136 130(L) 131(L)  Potassium 3.5 - 5.1 mmol/L 3.7 4.4 3.8  Chloride 98 - 111 mmol/L 104 95(L) 97(L)  CO2 22 - 32 mmol/L $RemoveB'23 25 25  'bjzpmBaX$ Calcium 8.9 - 10.3 mg/dL 9.2 9.4 8.9  Total Protein 6.5 - 8.1 g/dL - 7.2 -  Total Bilirubin 0.3 - 1.2 mg/dL - 0.6 -  Alkaline Phos 38 - 126 U/L - 97 -  AST 15 - 41 U/L - 15 -  ALT 0 - 44 U/L - 14 -    Lipid Panel     Component Value Date/Time   CHOL 114 06/11/2021 0842   TRIG 316 (H) 06/11/2021 0842    HDL 28 (L) 06/11/2021 0842   CHOLHDL 5.6 (H) 07/31/2019 1004   CHOLHDL 5.1 (H) 07/05/2016 0947   VLDL UNABLE TO CALCULATE IF TRIGLYCERIDE OVER 400 mg/dL 05/24/2016 0415   LDLCALC 39 06/11/2021 0842    CBC    Component Value Date/Time   WBC 10.0 08/30/2021 1353   RBC 5.10 08/30/2021 1353   HGB 14.9 08/30/2021 1353   HCT 45.0 08/30/2021 1353   PLT 175 08/30/2021 1353   MCV 88.2 08/30/2021 1353   MCH 29.2 08/30/2021 1353   MCHC 33.1 08/30/2021 1353   RDW 12.4 08/30/2021 1353   LYMPHSABS 2.6 04/01/2016 2144   MONOABS 0.4 04/01/2016 2144   EOSABS 0.3 04/01/2016 2144   BASOSABS 0.0 04/01/2016 2144    Lab Results  Component Value Date   HGBA1C 13.9 (A) 07/29/2021    Assessment & Plan:  1. Type 2 diabetes  mellitus with other neurologic complication, with long-term current use of insulin (HCC) Uncontrolled with A1c of 13.9; goal is less than 7.0 Continue management as per endocrine Counseled on Diabetic diet, my plate method, 824 minutes of moderate intensity exercise/week Blood sugar logs with fasting goals of 80-120 mg/dl, random of less than 180 and in the event of sugars less than 60 mg/dl or greater than 400 mg/dl encouraged to notify the clinic. Advised on the need for annual eye exams, annual foot exams, Pneumonia vaccine.  2. Hypertension complicating diabetes ([place]) Controlled Continue current antihypertensives Counseled on blood pressure goal of less than 130/80, low-sodium, DASH diet, medication compliance, 150 minutes of moderate intensity exercise per week. Discussed medication compliance, adverse effects.   3. Vitamin D deficiency Completed a course of Drisdol We will check vitamin D level - VITAMIN D 25 Hydroxy (Vit-D Deficiency, Fractures)  4. Chronic systolic CHF (congestive heart failure) (HCC) EF of 55 to 60% from echo of 2018 and stress test of 2019 We will repeat echocardiogram especially due to the complaints of dyspnea on elevation of her  extremities She has not been to see her cardiologist in over a year and has been advised to schedule appointment - ECHOCARDIOGRAM COMPLETE; Future  5. Screening for colon cancer Previously referred for colonoscopy which she had with [facility] physicians but is unable to retrieve report due to owing a balance and she is unable to secure an appointment [place] either We will proceed with Cologuard - Cologuard  6. Ear symptom Likely sinus related Component of cerumen impaction Advised to use topical ceruminolytic - cetirizine (ZYRTEC) 10 MG tablet; Take 1 tablet (10 mg total) by mouth daily.  Dispense: 30 tablet; Refill: 1  7. Costochondritis Troponin from recent ED visit was negative Advised to use NSAID   Health Care Maintenance: Referred for mammogram but she has not followed through with this.  Advised of the need to schedule her mammogram Meds ordered this encounter  Medications   cetirizine (ZYRTEC) 10 MG tablet    Sig: Take 1 tablet (10 mg total) by mouth daily.    Dispense:  30 tablet    Refill:  1    Follow-up: Return in about 3 months (around 12/08/2021) for Chronic medical conditions.       Charlott Rakes, MD, FAAFP. [facility] [place], [place]   09/07/2021, 2:29 PM

## 2021-09-07 NOTE — Progress Notes (Signed)
Having pain under both breast. ?Has SOB when reaching for things above her head. ?Check Vit D levels. ?

## 2021-09-08 ENCOUNTER — Telehealth: Payer: Self-pay

## 2021-09-08 LAB — VITAMIN D 25 HYDROXY (VIT D DEFICIENCY, FRACTURES): Vit D, 25-Hydroxy: 52.2 ng/mL (ref 30.0–100.0)

## 2021-09-08 NOTE — Telephone Encounter (Signed)
Patient contacted to schedule mammogram.  ? ?RE: Mobile Mammo event located at: ? ?[place]  [facility]  ?      ?[address]    ?[place] [address]    ? ?Date: April 7th at 10:10 am ? ?

## 2021-09-14 ENCOUNTER — Encounter: Payer: Medicaid Other | Admitting: Nutrition

## 2021-09-15 ENCOUNTER — Ambulatory Visit (HOSPITAL_COMMUNITY)
Admission: RE | Admit: 2021-09-15 | Discharge: 2021-09-15 | Disposition: A | Discharge status: Home / Self Care | Payer: Medicaid Other | Source: Ambulatory Visit | Attending: Family Medicine | Admitting: Family Medicine

## 2021-09-15 ENCOUNTER — Other Ambulatory Visit: Payer: Self-pay

## 2021-09-15 DIAGNOSIS — R0600 Dyspnea, unspecified: Secondary | ICD-10-CM | POA: Diagnosis not present

## 2021-09-15 DIAGNOSIS — I082 Rheumatic disorders of both aortic and tricuspid valves: Secondary | ICD-10-CM | POA: Diagnosis not present

## 2021-09-15 DIAGNOSIS — R0609 Other forms of dyspnea: Secondary | ICD-10-CM | POA: Diagnosis not present

## 2021-09-15 DIAGNOSIS — J449 Chronic obstructive pulmonary disease, unspecified: Secondary | ICD-10-CM | POA: Diagnosis not present

## 2021-09-15 DIAGNOSIS — E119 Type 2 diabetes mellitus without complications: Secondary | ICD-10-CM | POA: Diagnosis not present

## 2021-09-15 DIAGNOSIS — I10 Essential (primary) hypertension: Secondary | ICD-10-CM | POA: Insufficient documentation

## 2021-09-15 DIAGNOSIS — I5022 Chronic systolic (congestive) heart failure: Secondary | ICD-10-CM | POA: Diagnosis not present

## 2021-09-15 LAB — ECHOCARDIOGRAM COMPLETE
Area-P 1/2: 3.12 cm2
Calc EF: 58.6 %
P 1/2 time: 545 msec
S' Lateral: 3.4 cm
Single Plane A2C EF: 56.1 %
Single Plane A4C EF: 61.8 %

## 2021-09-15 NOTE — Progress Notes (Signed)
?  Echocardiogram ?2D Echocardiogram has been performed. ? ?[name] ?09/15/2021, 3:49 PM ?

## 2021-09-16 ENCOUNTER — Encounter: Payer: Medicaid Other | Attending: Internal Medicine | Admitting: Nutrition

## 2021-09-16 ENCOUNTER — Other Ambulatory Visit: Payer: Self-pay

## 2021-09-16 DIAGNOSIS — Z794 Long term (current) use of insulin: Secondary | ICD-10-CM | POA: Insufficient documentation

## 2021-09-16 DIAGNOSIS — E1165 Type 2 diabetes mellitus with hyperglycemia: Secondary | ICD-10-CM | POA: Diagnosis present

## 2021-09-17 ENCOUNTER — Telehealth: Payer: Self-pay | Admitting: Cardiovascular Disease

## 2021-09-17 NOTE — Progress Notes (Signed)
Patient is here with her mother to learn how to use the Dexcom CGM.  She was shown how to down the mobil app and the clarity app to her phone and how to insert the sensor, attach the transmitter and start the sensor.  The readings were linked to [place] and she had no final questions ?She and her mother were under the impression that she will be starting on an insulin pump and reports that her pharmacy does not have the order for the OmniPod 5 pump.  Note to Dr. Kelton Pillar about this.  ?

## 2021-09-17 NOTE — Telephone Encounter (Signed)
Hoy Register, MD  ?09/15/2021  7:11 PM EDT   ?  ?Echo reveals heart is pumping well, two of her heart valves demonstrate slight leaking. Please advise to follow up with her Cardiologist  ? ?Will route to Dr Elease Hashimoto for review and recommendations per request of patient. ?

## 2021-09-17 NOTE — Patient Instructions (Signed)
Read over instructions on how to use the Dexcom ?Change sensor every 10 days ?Change transmitter every 3 months and put transmitter number into G6 app with each new transmitter. ?Call Dexcom help line if questions. ?

## 2021-09-17 NOTE — Telephone Encounter (Signed)
Patient is calling stating she had an Echo done on 03/21. Her PCP advised her to call the office to have Dr. Elease Hashimoto review the results to see if he feels she needs to be seen sooner than what she is currently scheduled for. States two small leaks came back on the results.  ?

## 2021-09-18 NOTE — Telephone Encounter (Signed)
?  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  ?These are not likely to be the cause of her dyspnea.  ? ?She was last seen by Lizabeth Leyden, NP in 2021 and was last seen by me in 2018.  ?I suggest that she make an appt to be seen if she continues to be short of breath  ? ?PN  ? ?The patient has been notified of the result and verbalized understanding.  All questions (if any) were answered. ?Sampson Goon, RN 09/18/2021 8:10 AM   ?

## 2021-09-18 NOTE — Telephone Encounter (Signed)
Called and spoke directly to patient. Reviewed Dr Harvie Bridge interpretation and recommendations. Pt is scheduled for OV 10/22/21. ?

## 2021-09-21 NOTE — Telephone Encounter (Signed)
LVM that DR. Shamleffer does not want her on a pump just now.  Explained that she will need to be familiar with using the dexcom first, and that she will discuss this with her at the next  appointment ?

## 2021-09-22 ENCOUNTER — Encounter: Payer: Medicaid Other | Admitting: Nutrition

## 2021-09-29 ENCOUNTER — Other Ambulatory Visit: Payer: Self-pay | Admitting: Family Medicine

## 2021-09-29 ENCOUNTER — Other Ambulatory Visit: Payer: Self-pay | Admitting: Internal Medicine

## 2021-09-30 NOTE — Telephone Encounter (Signed)
LVM for pt to call back as soon as possible.  ? ?Patient contacted to schedule mammogram.  ? ?RE: Mobile Mammo event located at: ? ?[email or web address]  [facility]  ?      ?[address]    ?[place] [address]    ? ?Date: April 7th at 10:10 am.  ? ?

## 2021-10-02 ENCOUNTER — Inpatient Hospital Stay: Admission: RE | Admit: 2021-10-02 | Payer: Medicaid Other | Source: Ambulatory Visit

## 2021-10-08 ENCOUNTER — Encounter: Payer: Self-pay | Admitting: Internal Medicine

## 2021-10-08 ENCOUNTER — Ambulatory Visit (INDEPENDENT_AMBULATORY_CARE_PROVIDER_SITE_OTHER): Payer: Medicaid Other | Admitting: Internal Medicine

## 2021-10-08 VITALS — BP 112/70 | HR 72 | Ht 67.0 in | Wt 138.2 lb

## 2021-10-08 DIAGNOSIS — E1142 Type 2 diabetes mellitus with diabetic polyneuropathy: Secondary | ICD-10-CM | POA: Diagnosis not present

## 2021-10-08 DIAGNOSIS — E1165 Type 2 diabetes mellitus with hyperglycemia: Secondary | ICD-10-CM

## 2021-10-08 DIAGNOSIS — R739 Hyperglycemia, unspecified: Secondary | ICD-10-CM

## 2021-10-08 DIAGNOSIS — Z794 Long term (current) use of insulin: Secondary | ICD-10-CM

## 2021-10-08 DIAGNOSIS — E1149 Type 2 diabetes mellitus with other diabetic neurological complication: Secondary | ICD-10-CM | POA: Diagnosis not present

## 2021-10-08 LAB — POCT GLYCOSYLATED HEMOGLOBIN (HGB A1C): Hemoglobin A1C: 9.2 % — AB (ref 4.0–5.6)

## 2021-10-08 MED ORDER — OMNIPOD 5 DEXG7G6 PODS GEN 5 MISC: 1.0000 | 3 refills | Status: DC

## 2021-10-08 MED ORDER — DOXYCYCLINE MONOHYDRATE 100 MG PO TABS: 100.0000 mg | ORAL_TABLET | Freq: Two times a day (BID) | ORAL | 0 refills | Status: DC

## 2021-10-08 MED ORDER — OMNIPOD 5 DEXG7G6 INTRO GEN 5 KIT: 1.0000 | PACK | 0 refills | Status: DC

## 2021-10-08 NOTE — Progress Notes (Signed)
?Name: [name]  ?MRN/ DOB: [id number], 1961/04/16   ?Age/ Sex: 61 y.o., female   ? ?PCP: Charlott Rakes, MD   ?Reason for Endocrinology Evaluation: Type 2 Diabetes Mellitus  ?   ?Date of Initial Endocrinology Visit: 12/19/2020  ? ? ?PATIENT IDENTIFIER: [name] is a 61 y.o. female with a past medical history of T2DM, HTN, COPD  , retinitis pigmentosa ,and CHF. The patient presented for initial endocrinology clinic visit on 12/19/2020 for consultative assistance with her diabetes management.  ? ? ?HPI: ?Ms. Azpeitia was  ? ? ?Diagnosed with DM 2017 ?Prior Medications tried/Intolerance: Metformin - diarrhea, Trulicity - nausea  ?Currently checking blood sugars 0 x / day,   ?Hypoglycemia episodes : Unknown                 ?Hemoglobin A1c has ranged from 9.0% in 2018, peaking at 13.4% in 2022. ?Patient required assistance for hypoglycemia: no  ?Patient has required hospitalization within the last 1 year from hyper or hypoglycemia:  ? ?In terms of diet, the patient does not eat actual meal, snacks occasionally . Drinks sweet tea and diet Dr. Malachi Bonds  ? ?Had COVID twice 05/2020 and 07/2020. She has stopped insulin due to hypoglycemia due to being sick at the time COVID in 05/2020 ? ?She has concurrent vaginal yeast infection with irritation  ? ?On her initial visit to our clinic her A1c was 11.9% , she was on insulin mix and Glimepiride , which we stopped and started her on Trulicity and Lantus  ? ? ?SUBJECTIVE:  ? ?During the last visit (08/10/2021): A1c 13.9% We adjusted MDI regimen ? ? ? ? ? ?Today (10/08/21): [name] is here for a follow up on diabetes management. She is accompanied by her daughter today . She checks her blood sugars 4-5  times daily. The patient has  had hypoglycemic episodes since the last clinic visit, occur sporadically at night  ? ? ?She presented to the ED 06/09/2021 with hyperglycemia  ? ? ?She has a buttock and genital boil  as well as fever for the past 10 days  ?She  has nausea but no vomiting  ? ?HOME DIABETES REGIMEN: ?Novolog 10 units TIDQAC ?Lantus 24 units daily ?CF: Novolog (BG -130/30)  ? ? ? ?Statin: yes ?ACE-I/ARB: Allergic to lisinopril - swelling  ?Prior Diabetic Education: no ? ? ? ?CONTINUOUS GLUCOSE MONITORING RECORD INTERPRETATION   ? ?Dates of Recording: 3/31-4/13/2023 ? ?Sensor description: dexcom  ? ?Results statistics: ?  ?CGM use % of time 100  ?Average and SD 181/40  ?Time in range     52   %  ?% Time Above 180 43  ?% Time above 250 4  ?% Time Below target <1  ? ? ? ?Glycemic patterns summary: Bg's optimal overnight, hyperglycemia noted postprandial  ? ?Hyperglycemic episodes  post prandial  ? ?Hypoglycemic episodes occurred rare without a pattern  ? ?Overnight periods: stable  ? ? ? ? ? ? ?DIABETIC COMPLICATIONS: ?Microvascular complications:  ?Neuropathy ?Denies: CKD,retinopathy  ?Last eye exam: 12/2020 ? ?Macrovascular complications:  ?CHF  ?Denies: CAD, PVD, CVA ? ? ?PAST HISTORY: ?Past Medical History:  ?Past Medical History:  ?Diagnosis Date  ? Anxiety   ? Arthritis   ? Chronic systolic heart failure ([place])   ? EF 20-25% ECHO 05/2015  ? COPD (chronic obstructive pulmonary disease) ([place])   ? Depression   ? Diabetes mellitus without complication ([place])   ? Type II  ? Dysrhythmia   ?  pt unsure of name of arrythmia - " my heart rate will drop all of a sudden" -no current treatment - atrial flutter  ? Fibrillation, atrial ([place])   ? Gallstones   ? GERD (gastroesophageal reflux disease)   ? Headache(784.0)   ? migraines  ? Hypertension   ? Osteomyelitis ([place])   ? R ankle  ? Rash   ? arms  ? ?Past Surgical History:  ?Past Surgical History:  ?Procedure Laterality Date  ? ANKLE FRACTURE SURGERY Right 2015  ? CARDIAC CATHETERIZATION N/A 06/24/2015  ? Procedure: Left Heart Cath and Coronary Angiography;  Surgeon: Belva Crome, MD;  Location: [facility];  Service: Cardiovascular;  Laterality: N/A;  ? CARPAL TUNNEL RELEASE    ? bil  ? CHOLECYSTECTOMY   11/24/2011  ? Procedure: LAPAROSCOPIC CHOLECYSTECTOMY WITH INTRAOPERATIVE CHOLANGIOGRAM;  Surgeon: Joyice Faster. Cornett, MD;  Location: [facility];  Service: General;  Laterality: N/A;  Laparoscopic Cholecystectomy with Cholangiogram  ? COLONOSCOPY    ? ECTOPIC PREGNANCY SURGERY  many yrs ago  ? HARDWARE REMOVAL Right 07/15/2015  ? Procedure: RIGHT ANKLE HARDWARE REMOVAL, PLACEMENT OF STIMULAN ANTIBIOTIC BEADS AND PREVENA WOUND VAC.;  Surgeon: Meredith Pel, MD;  Location: [facility];  Service: Orthopedics;  Laterality: Right;  ? TOOTH EXTRACTION N/A 12/03/2016  ? Procedure: DENTAL EXTRACTIONS with Alveoloplasty;  Surgeon: Diona Browner, DDS;  Location: [facility];  Service: Oral Surgery;  Laterality: N/A;  ?  ?Social History:  reports that she has been smoking cigarettes and e-cigarettes. She started smoking about 5 years ago. She has a 20.00 pack-year smoking history. She has never used smokeless tobacco. She reports that she does not drink alcohol and does not use drugs. ?Family History:  ?Family History  ?Problem Relation Age of Onset  ? Diabetes Mother   ? Lung cancer Father   ? Heart attack Father 75  ?     CABG x4  ? Breast cancer Sister   ?     Survivor   ? ? ? ?HOME MEDICATIONS: ?Allergies as of 10/08/2021   ? ?   Reactions  ? Biaxin [clarithromycin] Anaphylaxis, Swelling  ? Clarithromycin Shortness Of Breath, Swelling  ? Lisinopril Swelling, Cough  ? Lip edema  ? Sulfa Antibiotics Anaphylaxis, Shortness Of Breath, Swelling, Hypertension  ? Chlorthalidone Nausea And Vomiting, Other (See Comments)  ? Clammy, Tachycardia, Headache  ? Daptomycin Nausea And Vomiting  ? Latex Hives, Rash  ? Amoxicillin-pot Clavulanate Diarrhea  ? Has patient had a PCN reaction causing immediate rash, facial/tongue/throat swelling, SOB or lightheadedness with hypotension:No ?Has patient had a PCN reaction causing severe rash involving mucus membranes or skin necrosis:No ?Has patient had a PCN reaction that required hospitalization:No ?Has patient  had a PCN reaction occurring within THE LAST 10 YEARS.  #  #  #  YES  #  #  #  ?If all of the above answers are "NO", then may proceed with Cephalosporin use.  ? Aspirin Nausea And Vomiting, Rash  ? On 364m dosage  ? Cholestyramine Nausea And Vomiting  ? Dilaudid [hydromorphone Hcl] Nausea And Vomiting  ? Lasix [furosemide] Nausea And Vomiting, Other (See Comments)  ? Headache   ? ?  ? ?  ?Medication List  ?  ? ?  ? Accurate as of October 08, 2021  4:12 PM. If you have any questions, ask your nurse or doctor.  ?  ?  ? ?  ? ?Accu-Chek Guide Me w/Device Kit ?Use to check blood  sugar TID. ?  ?Accu-Chek Guide test strip ?Generic drug: glucose blood ?1 each by Other route in the morning, at noon, in the evening, and at bedtime. Use as instructed ?  ?accu-chek softclix lancets ?Use as instructed daily. ?  ?Accu-Chek Softclix Lancets lancets ?Use to check blood sugar THREE TIMES DAILY ?What changed:  ?how much to take ?how to take this ?when to take this ?  ?cetirizine 10 MG tablet ?Commonly known as: ZYRTEC ?Take 1 tablet (10 mg total) by mouth daily. ?  ?Dexcom G6 Receiver Devi ?1 Device by Does not apply route as directed. ?  ?Dexcom G6 Sensor Misc ?1 Device by Does not apply route as directed. ?  ?Dexcom G6 Transmitter Misc ?1 Device by Does not apply route as directed. ?  ?dicyclomine 10 MG capsule ?Commonly known as: BENTYL ?Take 1 capsule (10 mg total) by mouth 3 (three) times daily before meals. ?What changed: when to take this ?  ?diphenoxylate-atropine 2.5-0.025 MG tablet ?Commonly known as: LOMOTIL ?Take 1 tablet by mouth 2 (two) times daily. ?  ?doxycycline 100 MG tablet ?Commonly known as: ADOXA ?Take 1 tablet (100 mg total) by mouth 2 (two) times daily. ?Started by: Dorita Sciara, MD ?  ?ergocalciferol 1.25 MG (50000 UT) capsule ?Commonly known as: Drisdol ?Take 1 capsule (50,000 Units total) by mouth once a week. ?  ?esomeprazole 20 MG capsule ?Commonly known as: [place] ?Take 1 capsule (20 mg total) by  mouth daily. ?  ?fenofibrate 145 MG tablet ?Commonly known as: TRICOR ?Take 1 tablet (145 mg total) by mouth daily. ?  ?FLUoxetine 40 MG capsule ?Commonly known as: PROZAC ?Take 1 capsule (40 mg total) by m

## 2021-10-08 NOTE — Patient Instructions (Addendum)
-   Decrease Lantus to 23 units daily  ?- Continue  Novolog to 10 units with each meal  ?- NOvolog correctional insulin: ADD extra units on insulin to your meal-time Novolog dose if your blood sugars are higher than 160. Use the scale below to help guide you:  ? ?Blood sugar before meal Number of units to inject  ?Less than 160 0 unit  ?161 -  190 1 units  ?191 -  220 2 units  ?221 -  250 3 units  ?251 -  280 4 units  ?281 -  310 5 units  ?310 -  340 6 units  ?341 -  370 7 units  ?371 -  400 8 units  ? ? ? ?HOW TO TREAT LOW BLOOD SUGARS (Blood sugar LESS THAN 70 MG/DL) ?Please follow the RULE OF 15 for the treatment of hypoglycemia treatment (when your (blood sugars are less than 70 mg/dL)  ? ?STEP 1: Take 15 grams of carbohydrates when your blood sugar is low, which includes:  ?3-4 GLUCOSE TABS  OR ?3-4 OZ OF JUICE OR REGULAR SODA OR ?ONE TUBE OF GLUCOSE GEL   ? ?STEP 2: RECHECK blood sugar in 15 MINUTES ?STEP 3: If your blood sugar is still low at the 15 minute recheck --> then, go back to STEP 1 and treat AGAIN with another 15 grams of carbohydrates. ? ? ?

## 2021-10-12 ENCOUNTER — Encounter: Payer: Self-pay | Admitting: Internal Medicine

## 2021-10-13 ENCOUNTER — Other Ambulatory Visit (HOSPITAL_COMMUNITY): Payer: Self-pay

## 2021-10-14 ENCOUNTER — Ambulatory Visit: Payer: Self-pay

## 2021-10-14 NOTE — Telephone Encounter (Signed)
? ? ?  Chief Complaint: Has 3 skin boils. Endocrinologist gave her 5 days of Doxycycline and told her to call Dr. Alvis Lemmings. Took last pill yesterday. Areas are still present. 2 on buttocks and 1 on her chin. Quarter size and hard to touch. Had a fever last week.Also has vaginal yeast from antibiotic.  ?Symptoms: Above ?Frequency: Last week ?Pertinent Negatives: Patient denies pain today ?Disposition: [] ED /[] Urgent Care (no appt availability in office) / [] Appointment(In office/virtual)/ []  [facility]/ [] Home Care/ [] Refused Recommended Disposition /[] [facility]/ []  Follow-up with PCP ?Additional Notes: Asking if Dr. wants to keep her on antibiotics or does she need to be seen. Please advise pt.  ?Answer Assessment - Initial Assessment Questions ?1. APPEARANCE of BOIL: "What does the boil look like?"  ?    Hard are ?2. LOCATION: "Where is the boil located?"  ?    2 on buttocks and 1 chin ?3. NUMBER: "How many boils are there?"  ?    3 ?4. SIZE: "How big is the boil?" (e.g., inches, cm; compare to size of a coin or other object) ?    Quarter size ?5. ONSET: "When did the boil start?" ?    Last week ?6. PAIN: "Is there any pain?" If Yes, ask: "How bad is the pain?"   (Scale 1-10; or mild, moderate, severe) ?    No pain now ?7. FEVER: "Do you have a fever?" If Yes, ask: "What is it, how was it measured, and when did it start?"  ?    Last week ?8. SOURCE: "Have you been around anyone with boils or other Staph infections?" "Have you ever had boils before?" ?    No ?9. OTHER SYMPTOMS: "Do you have any other symptoms?" (e.g., shaking chills, weakness, rash elsewhere on body) ?    No ?10. PREGNANCY: "Is there any chance you are pregnant?" "When was your last menstrual period?" ?      No ? ?Protocols used: Boil (Skin Abscess)-A-AH ? ?

## 2021-10-15 MED ORDER — FLUCONAZOLE 150 MG PO TABS: 150.0000 mg | ORAL_TABLET | Freq: Once | ORAL | 0 refills | Status: AC

## 2021-10-15 MED ORDER — DOXYCYCLINE MONOHYDRATE 100 MG PO TABS: 100.0000 mg | ORAL_TABLET | Freq: Two times a day (BID) | ORAL | 0 refills | Status: DC

## 2021-10-15 NOTE — Telephone Encounter (Signed)
Called pt made aware °

## 2021-10-15 NOTE — Addendum Note (Signed)
Addended byCharlott Rakes on: 10/15/2021 01:28 PM ? ? Modules accepted: Orders ? ?

## 2021-10-15 NOTE — Telephone Encounter (Signed)
Prescription for additional doxycycline pills have been sent to the pharmacy along with the Diflucan pill for yeast infection. ?

## 2021-10-16 ENCOUNTER — Telehealth: Payer: Self-pay | Admitting: Pharmacy Technician

## 2021-10-16 NOTE — Telephone Encounter (Signed)
Patient Advocate Encounter ? ?Received notification from [facility] that prior authorization for OMNIPOD 5 G6 KIT is required. ?  ?PA submitted on 4.21.23 ?[facility] [id number] ?Status is pending ?  ?[facility] will continue to follow ? ?Saher Davee R Rutha Melgoza, CPhT ?Patient Advocate ?[facility] ?Phone: [phone] ?Fax:  [phone] ? ?

## 2021-10-22 ENCOUNTER — Other Ambulatory Visit (HOSPITAL_COMMUNITY): Payer: Self-pay

## 2021-10-22 ENCOUNTER — Ambulatory Visit (INDEPENDENT_AMBULATORY_CARE_PROVIDER_SITE_OTHER): Payer: Medicaid Other | Admitting: Cardiovascular Disease

## 2021-10-22 ENCOUNTER — Encounter: Payer: Self-pay | Admitting: Cardiovascular Disease

## 2021-10-22 VITALS — BP 120/68 | HR 66 | Ht 67.0 in | Wt 138.4 lb

## 2021-10-22 DIAGNOSIS — I5022 Chronic systolic (congestive) heart failure: Secondary | ICD-10-CM

## 2021-10-22 NOTE — Progress Notes (Signed)
? ?Cardiology Office Note ? ? ?Date:  10/22/2021  ? ?ID:  [name], DOB 04-Jul-1960, MRN [id number] ? ?PCP:  Charlott Rakes, MD  ?Cardiologist:   Mertie Moores, MD  ? ?Chief Complaint  ?Patient presents with  ? Congestive Heart Failure  ?   ?  ? ? problem list ?1. Chronic systolic congestive heart failure ?2. Osteomyelitis of the right foot ?3. Essential hypertension ?4. COPD ?5. Diabetes Mellitus  ?  ?History of Present Illness: ?Dec. 6, 2016: ? ?[name] is a 61 y.o. female who presents for left arm tingling, heaviness ?Associated with left chest pressure - pushing sensation  ?Lasts from 2-4 minutes.   Occurs spontansously .  - at rest and exertion . ?Walks on occasion - not necessarily associated with walking  ?Have been going in since Oct.   ?Went to the ER on Oct. 12.  ? ?[facility] cardiology in 2010, ?Wore a monitor - showed some fluttering - she does not recall what it reported.  ?Had a cath in 2010 for similar issues.    Results and images are not found in epic.  ? ? ?Smokes - 5 cigarettes a day . ?No ETOH ?Father had CABG at age 6 ? ?Jul 25, 2015: ? ?[name] is seen back  For follow-up of her congestive heart failure. She had a cardiac catheterization which revealed no significant CAD. ? her ejection fraction is between 20 and 30%. She's had surgery on her right foot to remove hardware that was placed during a previous surgery. ? She has osteomyelitis of her right foot. She has a PICC line and gets antibiotics twice  a day. ? ?No CP  ?Breathing is ok. ?Still has palpitations .   ? ?September 10, 2016: ?[name] Is seen back after a year. ?She has a history of chronic systolic congestive heart failure. She has normal coronary arteries by heart cavitation in December, 2016. ? ?I saw her in January 2017 and set her up for a six-week return visit. She now returns after 14 months. ?She was hospitalized in Nov with CHF .  ?Is back on her meds  ?Still gets 2nd hand smoke.   Breathing has improved.   ? ? ?December 16, 2016: ? ?[name] is seen back for follow Up of her congestive heart failure. Echocardiogram in April, 2018 revealed normal left ventricular systolic function. ?She has grade 1 diastolic dysfunction. ? ?Has cut back on her salt . ?Trying to exercise - walks on occasion.  ? ?Recent labs shows markedly elevated glucose   ?HbA1C is 11.3  ? ?October 22, 2021 ?[name] is seen for follow up visit after a 5 year absence  ?She has a history of chronic combined congestive heart failure.  Her ejection fraction normalized on beta-blocker and valsartan. ? ?She had an echocardiogram in March, 2023.  She was found to have normal left ventricular systolic function with an EF of 60 to 65%.  Her diastolic parameters were normal.   ? ?Has been having left sided chest pan  ?When she reaches up in the kitchen, gets very short of breath  ?Pain in along her left rib - similar to shingles  ?Knife like or glass like symptoms  ?Did not have a shingles shot  ? ?Chol levels look  ?Trigs are 316.   She has greatly reduced her intake of carbs since that lab level .  ? ? ?Past Medical History:  ?Diagnosis Date  ? Anxiety   ? Arthritis   ?  Chronic systolic heart failure ([place])   ? EF 20-25% ECHO 05/2015  ? COPD (chronic obstructive pulmonary disease) ([place])   ? Depression   ? Diabetes mellitus without complication ([place])   ? Type II  ? Dysrhythmia   ? pt unsure of name of arrythmia - " my heart rate will drop all of a sudden" -no current treatment - atrial flutter  ? Fibrillation, atrial ([place])   ? Gallstones   ? GERD (gastroesophageal reflux disease)   ? Headache(784.0)   ? migraines  ? Hypertension   ? Osteomyelitis ([place])   ? R ankle  ? Rash   ? arms  ? ? ?Past Surgical History:  ?Procedure Laterality Date  ? ANKLE FRACTURE SURGERY Right 2015  ? CARDIAC CATHETERIZATION N/A 06/24/2015  ? Procedure: Left Heart Cath and Coronary Angiography;  Surgeon: Belva Crome, MD;  Location: [facility];  Service: Cardiovascular;  Laterality: N/A;   ? CARPAL TUNNEL RELEASE    ? bil  ? CHOLECYSTECTOMY  11/24/2011  ? Procedure: LAPAROSCOPIC CHOLECYSTECTOMY WITH INTRAOPERATIVE CHOLANGIOGRAM;  Surgeon: Joyice Faster. Cornett, MD;  [facility];  Service: General;  Laterality: N/A;  Laparoscopic Cholecystectomy with Cholangiogram  ? COLONOSCOPY    ? ECTOPIC PREGNANCY SURGERY  many yrs ago  ? HARDWARE REMOVAL Right 07/15/2015  ? Procedure: RIGHT ANKLE HARDWARE REMOVAL, PLACEMENT OF STIMULAN ANTIBIOTIC BEADS AND PREVENA WOUND VAC.;  Surgeon: Meredith Pel, MD;  Location: [facility];  Service: Orthopedics;  Laterality: Right;  ? TOOTH EXTRACTION N/A 12/03/2016  ? Procedure: DENTAL EXTRACTIONS with Alveoloplasty;  Surgeon: Diona Browner, DDS;  Location: [facility];  Service: Oral Surgery;  Laterality: N/A;  ? ? ? ?Current Outpatient Medications  ?Medication Sig Dispense Refill  ? Accu-Chek Softclix Lancets lancets Use to check blood sugar THREE TIMES DAILY 100 each 2  ? Blood Glucose Monitoring Suppl (ACCU-CHEK GUIDE ME) w/Device KIT Use to check blood sugar TID. 1 kit 0  ? cetirizine (ZYRTEC) 10 MG tablet Take 1 tablet (10 mg total) by mouth daily. 30 tablet 1  ? Continuous Blood Gluc Receiver (DEXCOM G6 RECEIVER) DEVI 1 Device by Does not apply route as directed. 1 each 0  ? Continuous Blood Gluc Sensor (DEXCOM G6 SENSOR) MISC 1 Device by Does not apply route as directed. 9 each 3  ? Continuous Blood Gluc Transmit (DEXCOM G6 TRANSMITTER) MISC 1 Device by Does not apply route as directed. 1 each 3  ? dicyclomine (BENTYL) 10 MG capsule Take 1 capsule (10 mg total) by mouth 3 (three) times daily before meals. 270 capsule 1  ? diphenoxylate-atropine (LOMOTIL) 2.5-0.025 MG tablet Take 1 tablet by mouth 2 (two) times daily. 30 tablet 1  ? ergocalciferol (DRISDOL) 1.25 MG (50000 UT) capsule Take 1 capsule (50,000 Units total) by mouth once a week. (Patient taking differently: Take 1,000 Units by mouth daily.) 12 capsule 1  ? esomeprazole (NEXIUM) 20 MG capsule Take 1 capsule (20 mg  total) by mouth daily. 90 capsule 1  ? fenofibrate (TRICOR) 145 MG tablet Take 1 tablet (145 mg total) by mouth daily. 90 tablet 1  ? FLUoxetine (PROZAC) 40 MG capsule Take 1 capsule (40 mg total) by mouth at bedtime. 90 capsule 1  ? glucose blood (ACCU-CHEK GUIDE) test strip 1 each by Other route in the morning, at noon, in the evening, and at bedtime. Use as instructed 400 each 3  ? hydrOXYzine (ATARAX) 10 MG tablet Take 1 tablet (10 mg total) by mouth 3 (  three) times daily as needed. 60 tablet 3  ? insulin aspart (NOVOLOG FLEXPEN) 100 UNIT/ML FlexPen Max Daily 50 units per correction scale 45 mL 2  ? Insulin Disposable Pump (OMNIPOD 5 G6 INTRO, GEN 5,) KIT 1 Device by Does not apply route every 3 (three) days. 1 kit 0  ? Insulin Disposable Pump (OMNIPOD 5 G6 POD, GEN 5,) MISC 1 Device by Does not apply route every 3 (three) days. 6 each 3  ? insulin glargine (LANTUS SOLOSTAR) 100 UNIT/ML Solostar Pen Inject 24 Units into the skin daily. 30 mL 2  ? Insulin Pen Needle 32G X 4 MM MISC 1 Device by Does not apply route 4 (four) times daily. 400 each 2  ? Lancet Devices (ACCU-CHEK SOFTCLIX) lancets Use as instructed daily. 1 each 5  ? meclizine (ANTIVERT) 25 MG tablet TAKE ONE TABLET BY MOUTH THREE TIMES DAILY AS NEEDED FOR DIZZINESS 90 tablet 0  ? metoprolol succinate (TOPROL-XL) 25 MG 24 hr tablet Take 1 tablet (25 mg total) by mouth daily. 90 tablet 1  ? rosuvastatin (CRESTOR) 10 MG tablet Take 1 tablet (10 mg total) by mouth daily. 90 tablet 1  ? VENTOLIN HFA 108 (90 Base) MCG/ACT inhaler Inhale 2 puffs into the lungs every 4 (four) hours as needed for wheezing or shortness of breath. 18 g 2  ? doxycycline (ADOXA) 100 MG tablet Take 1 tablet (100 mg total) by mouth 2 (two) times daily. (Patient not taking: Reported on 10/22/2021) 10 tablet 0  ? gabapentin (NEURONTIN) 300 MG capsule Take 2 capsules (600 mg total) by mouth 2 (two) times daily. (Patient not taking: Reported on 10/22/2021) 180 capsule 1  ? ?No current  facility-administered medications for this visit.  ? ?Facility-Administered Medications Ordered in Other Visits  ?Medication Dose Route Frequency Provider Last Rate Last Admin  ? regadenoson (LEXISCAN) injectio

## 2021-10-22 NOTE — Telephone Encounter (Signed)
Ran test claim for Omnipod 5 G6 kit & pods.  They each have a $4.00 copay. ?

## 2021-10-22 NOTE — Patient Instructions (Signed)
Medication Instructions:  ?Your physician recommends that you continue on your current medications as directed. Please refer to the Current Medication list given to you today. ? ?*If you need a refill on your cardiac medications before your next appointment, please call your pharmacy* ? ? ?Lab Work: ?NONE ?If you have labs (blood work) drawn today and your tests are completely normal, you will receive your results only by: ?MyChart Message (if you have MyChart) OR ?A paper copy in the mail ?If you have any lab test that is abnormal or we need to change your treatment, we will call you to review the results. ? ? ?Testing/Procedures: ?NONE ? ? ?Follow-Up: ?At [facility], you and your health needs are our priority.  As part of our continuing mission to provide you with exceptional heart care, we have created designated Provider Care Teams.  These Care Teams include your primary Cardiologist (physician) and Advanced Practice Providers (APPs -  Physician Assistants and Nurse Practitioners) who all work together to provide you with the care you need, when you need it. ? ?We recommend signing up for the patient portal called "MyChart".  Sign up information is provided on this After Visit Summary.  MyChart is used to connect with patients for Virtual Visits (Telemedicine).  Patients are able to view lab/test results, encounter notes, upcoming appointments, etc.  Non-urgent messages can be sent to your provider as well.   ?To learn more about what you can do with MyChart, go to [email or web address].   ? ?Your next appointment:   ?As needed ?Provider:   ?Kristeen Miss, MD ? ?Important Information About Sugar ? ? ? ? ?  ?

## 2021-10-23 ENCOUNTER — Other Ambulatory Visit: Payer: Self-pay | Admitting: Family Medicine

## 2021-10-23 DIAGNOSIS — H8113 Benign paroxysmal vertigo, bilateral: Secondary | ICD-10-CM

## 2021-10-26 ENCOUNTER — Other Ambulatory Visit: Payer: Self-pay | Admitting: Internal Medicine

## 2021-10-28 ENCOUNTER — Other Ambulatory Visit: Payer: Self-pay | Admitting: Family Medicine

## 2021-10-28 DIAGNOSIS — F419 Anxiety disorder, unspecified: Secondary | ICD-10-CM

## 2021-10-29 ENCOUNTER — Other Ambulatory Visit: Payer: Self-pay | Admitting: Internal Medicine

## 2021-10-30 ENCOUNTER — Ambulatory Visit: Payer: Self-pay

## 2021-10-30 MED ORDER — DEXCOM G6 RECEIVER DEVI: 1.0000 | 0 refills | Status: DC

## 2021-10-30 MED ORDER — FLUCONAZOLE 150 MG PO TABS: 150.0000 mg | ORAL_TABLET | Freq: Once | ORAL | 0 refills | Status: AC

## 2021-10-30 NOTE — Telephone Encounter (Signed)
Summary: boils/need for abx today  ? Pt states the dr is aware she gets hair follicles that get infected and causes a boil.  Pt has one on her nose, neck, and top of buttock. Pt just had this same thing on 4/19. She states it really never healed.  Pt thinks she may need a higher dose of abx, and a steroid along w/ diflucan for a yeast infection (that always comes after abx).  ?[facility] - [place], [place] - [id number]  [address]   ?  ?Called Pt  - LMOM ?

## 2021-10-30 NOTE — Telephone Encounter (Signed)
Pt states the dr is aware she gets hair follicles that get infected and causes a boil.  Pt has one on her nose, neck, and top of buttock. Pt just had this same thing on 4/19. She states it really never healed.  Pt thinks she may need a higher dose of abx, and a steroid along w/ diflucan for a yeast infection (that always comes after abx).  ?[facility] - [place], [place] - [address]  [address]  ? ?Left message to call back about symptoms. ?

## 2021-10-30 NOTE — Telephone Encounter (Signed)
Pt called, LVMTCB to discuss with a nurse. Pt called x 3, will route to practice per protocol.  ? ?Summary: boils/need for abx today  ?  Pt states the dr is aware she gets hair follicles that get infected and causes a boil.  Pt has one on her nose, neck, and top of buttock. Pt just had this same thing on 4/19. She states it really never healed.  Pt thinks she may need a higher dose of abx, and a steroid along w/ diflucan for a yeast infection (that always comes after abx).  ?[facility] - [place], [place] - [id number]  [address]   ?  ? ?

## 2021-10-30 NOTE — Addendum Note (Signed)
Addended by: Charlott Rakes on: 10/30/2021 09:20 PM ? ? Modules accepted: Orders ? ?

## 2021-10-30 NOTE — Telephone Encounter (Signed)
Done

## 2021-10-31 LAB — COLOGUARD: COLOGUARD: NEGATIVE

## 2021-11-02 ENCOUNTER — Telehealth: Payer: Self-pay | Admitting: Family Medicine

## 2021-11-02 NOTE — Telephone Encounter (Signed)
?  Chief Complaint: 3 boils ?Symptoms: red painful boils to side of nose, neck, and top of buttock also having a stinging sensation to left side breast to axilla area ?Frequency: 4 weeks ?Pertinent Negatives: Patient denies fever, rash,  ?Disposition: [] ED /[] Urgent Care (no appt availability in office) / [] Appointment(In office/virtual)/ []  [facility]/ [] Home Care/ [] Refused Recommended Disposition /[] [facility]/ []  Follow-up with PCP ?Additional Notes: pt stated that she would like to start steroids, a higher dose of medication. Attempted to make appt and none available- refused VV. No appt noted with PCP and the other providers  ?Pt stated her has 2 boils as well.  ?

## 2021-11-02 NOTE — Telephone Encounter (Signed)
Copied from CRM [id number]. Topic: General - Other ?>> Nov 02, 2021  9:06 AM Aretta Nip wrote: ?Reason for CRM: Pt calling in to let Dr Alvis Lemmings know that she did complete the colo-guard and it has been sent, ?

## 2021-11-02 NOTE — Telephone Encounter (Signed)
Pt has been informed of negative results

## 2021-11-02 NOTE — Telephone Encounter (Signed)
Reason for Disposition ?? 2 or more boils ? ?Answer Assessment - Initial Assessment Questions ?1. APPEARANCE of BOIL: "What does the boil look like?"  ?    Red raised ?2. LOCATION: "Where is the boil located?"  ?    *No Answer* ?3. NUMBER: "How many boils are there?"  ?    3 ?4. SIZE: "How big is the boil?" (e.g., inches, cm; compare to size of a coin or other object) ?    Nose- nickle neck buttocks: dimes ?5. ONSET: "When did the boil start?" ?    Since last round of antibiotics never went away ?6. PAIN: "Is there any pain?" If Yes, ask: "How bad is the pain?"   (Scale 1-10; or mild, moderate, severe) ?   Constant pain and feels like stabbing pain under arm to center of back- feels itchy ?7. FEVER: "Do you have a fever?" If Yes, ask: "What is it, how was it measured, and when did it start?"  ?    no ?8. SOURCE: "Have you been around anyone with boils or other Staph infections?" "Have you ever had boils before?" ?    Yes- boils  grandson has 2 boils 2 on buttocks ?9. OTHER SYMPTOMS: "Do you have any other symptoms?" (e.g., shaking chills, weakness, rash elsewhere on body) ?    Rash near axl ?10. PREGNANCY: "Is there any chance you are pregnant?" "When was your last menstrual period?" ?      N/a ? ?Protocols used: Boil (Skin Abscess)-A-AH ? ?

## 2021-11-04 NOTE — Telephone Encounter (Addendum)
Patient will need an appointment to f/u for concerns she expressed regarding boils.  ? ?Called and Left message on voicemail to return call.  Please schedule apt.  ? ?

## 2021-11-05 ENCOUNTER — Other Ambulatory Visit: Payer: Self-pay

## 2021-11-05 ENCOUNTER — Encounter (HOSPITAL_COMMUNITY): Payer: Self-pay | Admitting: Emergency Medicine

## 2021-11-05 ENCOUNTER — Emergency Department (HOSPITAL_COMMUNITY): Payer: Medicaid Other

## 2021-11-05 ENCOUNTER — Emergency Department (HOSPITAL_COMMUNITY)
Admission: EM | Admit: 2021-11-05 | Discharge: 2021-11-05 | Disposition: A | Discharge status: Home / Self Care | Payer: Medicaid Other | Attending: Emergency Medicine | Admitting: Emergency Medicine

## 2021-11-05 DIAGNOSIS — R42 Dizziness and giddiness: Secondary | ICD-10-CM | POA: Diagnosis not present

## 2021-11-05 DIAGNOSIS — R519 Headache, unspecified: Secondary | ICD-10-CM | POA: Diagnosis not present

## 2021-11-05 DIAGNOSIS — R3915 Urgency of urination: Secondary | ICD-10-CM | POA: Insufficient documentation

## 2021-11-05 DIAGNOSIS — R350 Frequency of micturition: Secondary | ICD-10-CM | POA: Insufficient documentation

## 2021-11-05 DIAGNOSIS — Z91040 Latex allergy status: Secondary | ICD-10-CM | POA: Diagnosis not present

## 2021-11-05 DIAGNOSIS — M549 Dorsalgia, unspecified: Secondary | ICD-10-CM | POA: Insufficient documentation

## 2021-11-05 DIAGNOSIS — R21 Rash and other nonspecific skin eruption: Secondary | ICD-10-CM | POA: Diagnosis not present

## 2021-11-05 DIAGNOSIS — R0789 Other chest pain: Secondary | ICD-10-CM | POA: Diagnosis present

## 2021-11-05 DIAGNOSIS — R81 Glycosuria: Secondary | ICD-10-CM | POA: Diagnosis not present

## 2021-11-05 DIAGNOSIS — F424 Excoriation (skin-picking) disorder: Secondary | ICD-10-CM | POA: Insufficient documentation

## 2021-11-05 DIAGNOSIS — M7918 Myalgia, other site: Secondary | ICD-10-CM | POA: Diagnosis not present

## 2021-11-05 LAB — CBC WITH DIFFERENTIAL/PLATELET
Abs Immature Granulocytes: 0.02 10*3/uL (ref 0.00–0.07)
Basophils Absolute: 0.1 10*3/uL (ref 0.0–0.1)
Basophils Relative: 1 %
Eosinophils Absolute: 0.3 10*3/uL (ref 0.0–0.5)
Eosinophils Relative: 3 %
HCT: 42.8 % (ref 36.0–46.0)
Hemoglobin: 13.8 g/dL (ref 12.0–15.0)
Immature Granulocytes: 0 %
Lymphocytes Relative: 31 %
Lymphs Abs: 2.8 10*3/uL (ref 0.7–4.0)
MCH: 28.5 pg (ref 26.0–34.0)
MCHC: 32.2 g/dL (ref 30.0–36.0)
MCV: 88.2 fL (ref 80.0–100.0)
Monocytes Absolute: 0.6 10*3/uL (ref 0.1–1.0)
Monocytes Relative: 7 %
Neutro Abs: 5.2 10*3/uL (ref 1.7–7.7)
Neutrophils Relative %: 58 %
Platelets: 166 10*3/uL (ref 150–400)
RBC: 4.85 MIL/uL (ref 3.87–5.11)
RDW: 12.8 % (ref 11.5–15.5)
WBC: 9 10*3/uL (ref 4.0–10.5)
nRBC: 0 % (ref 0.0–0.2)

## 2021-11-05 LAB — URINALYSIS, ROUTINE W REFLEX MICROSCOPIC
Bilirubin Urine: NEGATIVE
Glucose, UA: >=500 mg/dL — AB
Hgb urine dipstick: NEGATIVE
Ketones, ur: NEGATIVE mg/dL
Leukocytes,Ua: NEGATIVE
Nitrite: NEGATIVE
Protein, ur: NEGATIVE mg/dL
Specific Gravity, Urine: 1.023 (ref 1.005–1.030)
pH: 5 (ref 5.0–8.0)

## 2021-11-05 LAB — BASIC METABOLIC PANEL
Anion gap: 7 (ref 5–15)
BUN: 11 mg/dL (ref 6–20)
CO2: 23 mmol/L (ref 22–32)
Calcium: 9.1 mg/dL (ref 8.9–10.3)
Chloride: 108 mmol/L (ref 98–111)
Creatinine, Ser: 0.55 mg/dL (ref 0.44–1.00)
GFR, Estimated: >60 mL/min (ref >60)
Glucose, Bld: 174 mg/dL — ABNORMAL HIGH (ref 70–99)
Potassium: 3.7 mmol/L (ref 3.5–5.1)
Sodium: 138 mmol/L (ref 135–145)

## 2021-11-05 LAB — TROPONIN I (HIGH SENSITIVITY)
Troponin I (High Sensitivity): 4 ng/L (ref <18)
Troponin I (High Sensitivity): 4 ng/L (ref <18)

## 2021-11-05 MED ORDER — OXYCODONE-ACETAMINOPHEN 5-325 MG PO TABS: 1.0000 | ORAL_TABLET | ORAL | 0 refills | Status: DC | PRN

## 2021-11-05 MED ORDER — OXYCODONE-ACETAMINOPHEN 5-325 MG PO TABS
1.0000 | ORAL_TABLET | Freq: Once | ORAL | Status: AC
  Administered 2021-11-05: 1 via ORAL
  Filled 2021-11-05: qty 1

## 2021-11-05 NOTE — ED Triage Notes (Signed)
Pt presents for eval of left sided rib/chest/back pain.  Was evaluated by her cardiologist on Friday and told it could be shingles.  Rash developing on left back noted in triage.  Pt states she cannot get in to see her PCP until next week and pain is too bad to tolerate.  Pt reports pain that produces nausea and is not controlled with OTC meds.  Has had shingles in the past and reports this does feel similar but she would like to be sure.  ?

## 2021-11-05 NOTE — Telephone Encounter (Signed)
Scheduled apt with Dr. Alvis Lemmings 11/11/2021 ?

## 2021-11-05 NOTE — ED Notes (Signed)
Patient verbalizes understanding of discharge instructions. Opportunity for questioning and answers were provided. Armband removed by staff, pt discharged from ED ambulatory.   

## 2021-11-05 NOTE — ED Provider Notes (Signed)
?[facility] ?Provider Note ? ? ?CSN: [id number] ?Arrival date & time: 11/05/21  1504 ? ?  ? ?History ? ?Chief Complaint  ?Patient presents with  ? Side Pain  ? ? ?[name] is a 61 y.o. female. ? ?HPI ?Onset left chest pain, posterior, several weeks ago without trauma.  She has some sores on her back that her daughter states are from "picking."  No known trauma.  She sometimes has urinary urgency, but no frequency, dysuria or hematuria.  He denies fever, chills, nausea or vomiting.  She occasionally gets lightheaded when she moves her left arm and has had some catches in her left neck.  She also complains of headache on and off.  No prior similar problems.  There are no other known active modifying factors ?  ? ?Home Medications ?Prior to Admission medications   ?Medication Sig Start Date End Date Taking? Authorizing Provider  ?FLUoxetine (PROZAC) 40 MG capsule Take 1 capsule (40 mg total) by mouth at bedtime. 10/28/21   Charlott Rakes, MD  ?oxyCODONE-acetaminophen (PERCOCET) 5-325 MG tablet Take 1 tablet by mouth every 4 (four) hours as needed for severe pain or moderate pain. 11/05/21  Yes Daleen Bo, MD  ?Accu-Chek Softclix Lancets lancets Use to check blood sugar THREE TIMES DAILY 06/09/21   Truddie Hidden, MD  ?Blood Glucose Monitoring Suppl (ACCU-CHEK GUIDE ME) w/Device KIT Use to check blood sugar TID. 01/30/21   Charlott Rakes, MD  ?cetirizine (ZYRTEC) 10 MG tablet Take 1 tablet (10 mg total) by mouth daily. 09/07/21   Charlott Rakes, MD  ?Continuous Blood Gluc Receiver (DEXCOM G6 RECEIVER) DEVI 1 Device by Does not apply route as directed. 10/30/21   Charlott Rakes, MD  ?Continuous Blood Gluc Sensor (DEXCOM G6 SENSOR) MISC 1 Device by Does not apply route as directed. 07/29/21   Shamleffer, Melanie Crazier, MD  ?Continuous Blood Gluc Transmit (DEXCOM G6 TRANSMITTER) MISC 1 Device by Does not apply route as directed. 07/29/21   Shamleffer, Melanie Crazier, MD   ?dicyclomine (BENTYL) 10 MG capsule Take 1 capsule (10 mg total) by mouth 3 (three) times daily before meals. 04/14/21   Charlott Rakes, MD  ?diphenoxylate-atropine (LOMOTIL) 2.5-0.025 MG tablet Take 1 tablet by mouth 2 (two) times daily. 07/03/21   Charlott Rakes, MD  ?doxycycline (ADOXA) 100 MG tablet Take 1 tablet (100 mg total) by mouth 2 (two) times daily. ?Patient not taking: Reported on 10/22/2021 10/15/21   Charlott Rakes, MD  ?ergocalciferol (DRISDOL) 1.25 MG (50000 UT) capsule Take 1 capsule (50,000 Units total) by mouth once a week. ?Patient taking differently: Take 1,000 Units by mouth daily. 03/11/21   Charlott Rakes, MD  ?esomeprazole (NEXIUM) 20 MG capsule Take 1 capsule (20 mg total) by mouth daily. 06/09/21   Charlott Rakes, MD  ?fenofibrate (TRICOR) 145 MG tablet Take 1 tablet (145 mg total) by mouth daily. 09/30/21   Charlott Rakes, MD  ?gabapentin (NEURONTIN) 300 MG capsule Take 2 capsules (600 mg total) by mouth 2 (two) times daily. ?Patient not taking: Reported on 10/22/2021 07/23/21   Charlott Rakes, MD  ?glucose blood (ACCU-CHEK GUIDE) test strip 1 each by Other route in the morning, at noon, in the evening, and at bedtime. Use as instructed 07/29/21   Shamleffer, Melanie Crazier, MD  ?hydrOXYzine (ATARAX) 10 MG tablet Take 1 tablet (10 mg total) by mouth 3 (three) times daily as needed. 06/09/21   Charlott Rakes, MD  ?insulin aspart (NOVOLOG FLEXPEN) 100 UNIT/ML FlexPen Max  Daily 50 units per correction scale 07/30/21   Shamleffer, Melanie Crazier, MD  ?Insulin Disposable Pump (OMNIPOD 5 G6 INTRO, GEN 5,) KIT 1 Device by Does not apply route every 3 (three) days. 10/08/21   Shamleffer, Melanie Crazier, MD  ?Insulin Disposable Pump (OMNIPOD 5 G6 POD, GEN 5,) MISC 1 Device by Does not apply route every 3 (three) days. 10/08/21   Shamleffer, Melanie Crazier, MD  ?insulin glargine (LANTUS SOLOSTAR) 100 UNIT/ML Solostar Pen Inject 24 Units into the skin daily. 07/30/21   Shamleffer, Melanie Crazier, MD   ?Insulin Pen Needle 32G X 4 MM MISC 1 Device by Does not apply route 4 (four) times daily. 07/30/21   Shamleffer, Melanie Crazier, MD  ?Lancet Devices [facility]) lancets Use as instructed daily. 11/24/16   Charlott Rakes, MD  ?meclizine (ANTIVERT) 25 MG tablet TAKE ONE TABLET BY MOUTH THREE TIMES DAILY AS NEEDED dizziness 10/23/21   Charlott Rakes, MD  ?metoprolol succinate (TOPROL-XL) 25 MG 24 hr tablet Take 1 tablet (25 mg total) by mouth daily. 06/09/21   Charlott Rakes, MD  ?rosuvastatin (CRESTOR) 10 MG tablet Take 1 tablet (10 mg total) by mouth daily. 06/09/21   Charlott Rakes, MD  ?VENTOLIN HFA 108 (90 Base) MCG/ACT inhaler Inhale 2 puffs into the lungs every 4 (four) hours as needed for wheezing or shortness of breath. 09/04/21   Charlott Rakes, MD  ?   ? ?Allergies    ?Biaxin [clarithromycin], Clarithromycin, Lisinopril, Sulfa antibiotics, Chlorthalidone, Daptomycin, Latex, Amoxicillin-pot clavulanate, Aspirin, Cholestyramine, Dilaudid [hydromorphone hcl], and Lasix [furosemide]   ? ?Review of Systems   ?Review of Systems ? ?Physical Exam ?Updated Vital Signs ?BP (!) 153/83   Pulse 67   Temp 97.9 ?F (36.6 ?C) (Oral)   Resp 20   Ht _0  (1.626 m)   LMP 07/24/2011   SpO2 96%   BMI 23.76 kg/m?  ?Physical Exam ?Vitals and nursing note reviewed.  ?Constitutional:   ?   General: She is not in acute distress. ?   Appearance: She is well-developed. She is not ill-appearing or diaphoretic.  ?HENT:  ?   Head: Normocephalic and atraumatic.  ?   Right Ear: External ear normal.  ?   Left Ear: External ear normal.  ?Eyes:  ?   Conjunctiva/sclera: Conjunctivae normal.  ?   Pupils: Pupils are equal, round, and reactive to light.  ?Neck:  ?   Trachea: Phonation normal.  ?Cardiovascular:  ?   Rate and Rhythm: Normal rate and regular rhythm.  ?   Heart sounds: Normal heart sounds.  ?Pulmonary:  ?   Effort: Pulmonary effort is normal.  ?   Breath sounds: Normal breath sounds.  ?Abdominal:  ?   General: There  is no distension.  ?   Palpations: Abdomen is soft.  ?   Tenderness: There is no abdominal tenderness.  ?Musculoskeletal:     ?   General: Normal range of motion.  ?   Cervical back: Normal range of motion and neck supple.  ?Skin: ?   General: Skin is warm and dry.  ?   Comments: Few scattered small excoriations of the posterior thorax, not in a dermatomal pattern.  No areas of vesicles, petechiae or drainage.  The lesions are all approximately 2-3  mm.  ?Neurological:  ?   Mental Status: She is alert and oriented to person, place, and time.  ?   Cranial Nerves: No cranial nerve deficit.  ?   Sensory: No sensory deficit.  ?  Motor: No abnormal muscle tone.  ?   Coordination: Coordination normal.  ?Psychiatric:     ?   Mood and Affect: Mood normal.     ?   Behavior: Behavior normal.     ?   Thought Content: Thought content normal.     ?   Judgment: Judgment normal.  ? ? ?ED Results / Procedures / Treatments   ?Labs ?(all labs ordered are listed, but only abnormal results are displayed) ?Labs Reviewed  ?BASIC METABOLIC PANEL - Abnormal; Notable for the following components:  ?    Result Value  ? Glucose, Bld 174 (*)   ? All other components within normal limits  ?URINALYSIS, ROUTINE W REFLEX MICROSCOPIC - Abnormal; Notable for the following components:  ? APPearance HAZY (*)   ? Glucose, UA >=500 (*)   ? Bacteria, UA RARE (*)   ? All other components within normal limits  ?CBC WITH DIFFERENTIAL/PLATELET  ?TROPONIN I (HIGH SENSITIVITY)  ?TROPONIN I (HIGH SENSITIVITY)  ? ? ?EKG ?EKG Interpretation ? ?Date/Time:  Thursday Nov 05 2021 21:18:01 EDT ?Ventricular Rate:  62 ?PR Interval:  161 ?QRS Duration: 131 ?QT Interval:  486 ?QTC Calculation: 494 ?R Axis:   -80 ?Text Interpretation: Sinus rhythm Probable left atrial enlargement Left bundle branch block since last tracing no significant change Confirmed by Daleen Bo [phone]) on 11/05/2021 9:30:11 PM ? ?Radiology ?DG Chest 2 View ? ?Result Date: 11/05/2021 ?CLINICAL  DATA:  LEFT chest, rib, and back pain EXAM: CHEST - 2 VIEW COMPARISON:  08/30/2021 FINDINGS: Normal heart size, mediastinal contours, and pulmonary vascularity. Atherosclerotic calcification aorta. Lungs clear.

## 2021-11-05 NOTE — Discharge Instructions (Signed)
Try using heat on the sore area 3-4 times a day.  Do not drive or drink alcohol when taking the narcotic pain reliever.  Follow-up with your doctor as needed for problems ?

## 2021-11-05 NOTE — ED Provider Triage Note (Signed)
Emergency Medicine Provider Triage Evaluation Note ? ?Kathlen Mody , a 61 y.o. female  was evaluated in triage.  Pt complains of left-sided chest pain of about 2-week duration.  Patient was seen by her PCP and sent to cardiology.  Cardiology concerned it may be shingles.  She has not been able to follow-up with her PCP since.  She does have a few lesions on her back however not in the dermatomal region she is having pain. ? ?Review of Systems  ?Positive: As above ?Negative: As above ? ?Physical Exam  ?BP (!) 158/94 (BP Location: Left Arm)   Pulse 74   Temp 98.3 ?F (36.8 ?C) (Oral)   Resp 16   LMP 07/24/2011   SpO2 98%  ?Gen:   Awake, no distress   ?Resp:  Normal effort  ?MSK:   Moves extremities without difficulty  ?Other:   ? ?Medical Decision Making  ?Medically screening exam initiated at 3:42 PM.  Appropriate orders placed.  Katniss Weedman was informed that the remainder of the evaluation will be completed by another provider, this initial triage assessment does not replace that evaluation, and the importance of remaining in the ED until their evaluation is complete. ? ? ?  ?Marita Kansas, [name] ?11/05/21 1543 ? ?

## 2021-11-05 NOTE — ED Notes (Signed)
Urine culture sent down with specimen at this time  ?

## 2021-11-11 ENCOUNTER — Ambulatory Visit: Payer: Medicaid Other | Admitting: Family Medicine

## 2021-11-25 ENCOUNTER — Ambulatory Visit: Payer: Medicaid Other | Attending: Physician Assistant | Admitting: Physician Assistant

## 2021-11-25 ENCOUNTER — Encounter: Payer: Self-pay | Admitting: Physician Assistant

## 2021-11-25 VITALS — BP 156/88 | HR 61 | Temp 98.5°F | Wt 138.4 lb

## 2021-11-25 DIAGNOSIS — E1149 Type 2 diabetes mellitus with other diabetic neurological complication: Secondary | ICD-10-CM

## 2021-11-25 DIAGNOSIS — L0292 Furuncle, unspecified: Secondary | ICD-10-CM

## 2021-11-25 DIAGNOSIS — R350 Frequency of micturition: Secondary | ICD-10-CM

## 2021-11-25 DIAGNOSIS — R112 Nausea with vomiting, unspecified: Secondary | ICD-10-CM

## 2021-11-25 DIAGNOSIS — Z09 Encounter for follow-up examination after completed treatment for conditions other than malignant neoplasm: Secondary | ICD-10-CM

## 2021-11-25 DIAGNOSIS — Z794 Long term (current) use of insulin: Secondary | ICD-10-CM | POA: Diagnosis not present

## 2021-11-25 LAB — POCT URINALYSIS DIP (CLINITEK)
Bilirubin, UA: NEGATIVE
Blood, UA: NEGATIVE
Glucose, UA: 100 mg/dL — AB
Ketones, POC UA: NEGATIVE mg/dL
Leukocytes, UA: NEGATIVE
Nitrite, UA: NEGATIVE
POC PROTEIN,UA: NEGATIVE
Spec Grav, UA: >=1.03 — AB (ref 1.010–1.025)
Urobilinogen, UA: 0.2 E.U./dL
pH, UA: 6.5 (ref 5.0–8.0)

## 2021-11-25 LAB — GLUCOSE, POCT (MANUAL RESULT ENTRY): POC Glucose: 133 mg/dl — AB (ref 70–99)

## 2021-11-25 MED ORDER — DOXYCYCLINE HYCLATE 100 MG PO TABS: 100.0000 mg | ORAL_TABLET | Freq: Two times a day (BID) | ORAL | 0 refills | Status: DC

## 2021-11-25 MED ORDER — PROMETHAZINE HCL 25 MG PO TABS: ORAL_TABLET | ORAL | 0 refills | Status: DC

## 2021-11-25 MED ORDER — FLUCONAZOLE 150 MG PO TABS
150.0000 mg | ORAL_TABLET | Freq: Once | ORAL | 0 refills | Status: AC
Start: 2021-11-25 — End: 2021-11-25

## 2021-11-25 MED ORDER — MUPIROCIN 2 % EX OINT: 1.0000 "application " | TOPICAL_OINTMENT | Freq: Two times a day (BID) | CUTANEOUS | 1 refills | Status: DC

## 2021-11-25 NOTE — Progress Notes (Signed)
Patient ID: [name], female   DOB: 1961/05/06, 61 y.o.   MRN: [id number]   Laurynn Mccorvey, is a 61 y.o. female  XTA:[id number]  [id number]  DOB - 03/12/1961  No chief complaint on file.      Subjective:   [name] is a 61 y.o. female here today for a follow up visit after being seen in the ED 5/11 for L sided abdominal pain/lower chest pain.  Today she is having boils on her face and neck and has a couple on her buttocks.  Years ago she would see a dermatologist and they would give her an antibiotic and steroid cream.  She is also c/o L sided abdominal pain that is intermittent.  Still about the same as it was at ED.  She has now been having occasional loose stools/diarrhea as well as nausea/vomiting.  This has not been daily but intermittently.  Says she is having temps in the evenings but difficult to tell if this is subjective or actually measured.  She has had her gallbladder removed.  No cough.  Not on well water.     No problems updated.  ALLERGIES: Allergies  Allergen Reactions   Biaxin [Clarithromycin] Anaphylaxis and Swelling   Clarithromycin Shortness Of Breath and Swelling   Lisinopril Swelling and Cough    Lip edema   Sulfa Antibiotics Anaphylaxis, Shortness Of Breath, Swelling and Hypertension   Chlorthalidone Nausea And Vomiting and Other (See Comments)    Clammy, Tachycardia, Headache    Daptomycin Nausea And Vomiting   Latex Hives and Rash   Amoxicillin-Pot Clavulanate Diarrhea    Has patient had a PCN reaction causing immediate rash, facial/tongue/throat swelling, SOB or lightheadedness with hypotension:No Has patient had a PCN reaction causing severe rash involving mucus membranes or skin necrosis:No Has patient had a PCN reaction that required hospitalization:No Has patient had a PCN reaction occurring within THE LAST 10 YEARS.  #  #  #  YES  #  #  #  If all of the above answers are "NO", then may proceed with Cephalosporin use.    Aspirin  Nausea And Vomiting and Rash    On $R'325mg'wZ$  dosage   Cholestyramine Nausea And Vomiting   Dilaudid [Hydromorphone Hcl] Nausea And Vomiting   Lasix [Furosemide] Nausea And Vomiting and Other (See Comments)    Headache     PAST MEDICAL HISTORY: Past Medical History:  Diagnosis Date   Anxiety    Arthritis    Chronic systolic heart failure (HCC)    EF 20-25% ECHO 05/2015   COPD (chronic obstructive pulmonary disease) (HCC)    Depression    Diabetes mellitus without complication ([place])    Type II   Dysrhythmia    pt unsure of name of arrythmia - " my heart rate will drop all of a sudden" -no current treatment - atrial flutter   Fibrillation, atrial (HCC)    Gallstones    GERD (gastroesophageal reflux disease)    Headache(784.0)    migraines   Hypertension    Osteomyelitis (HCC)    R ankle   Rash    arms    MEDICATIONS AT HOME: Prior to Admission medications   Medication Sig Start Date End Date Taking? Authorizing Provider  doxycycline (VIBRA-TABS) 100 MG tablet Take 1 tablet (100 mg total) by mouth 2 (two) times daily. 11/25/21  Yes Freeman Caldron M, [name]  fluconazole (DIFLUCAN) 150 MG tablet Take 1 tablet (150 mg total) by mouth once for 1 dose. And  repeat in 1 week 11/25/21 11/25/21 Yes Yuuki Skeens, Marzella Schlein, [name]  FLUoxetine (PROZAC) 40 MG capsule Take 1 capsule (40 mg total) by mouth at bedtime. 10/28/21   Hoy Register, MD  mupirocin ointment (BACTROBAN) 2 % Apply 1 application. topically 2 (two) times daily. 11/25/21  Yes Anders Simmonds, [name]  promethazine (PHENERGAN) 25 MG tablet 1/2-1 tab every 4 to 6 hours prn nausea 11/25/21  Yes Georgian Co M, [name]  Accu-Chek Softclix Lancets lancets Use to check blood sugar THREE TIMES DAILY 06/09/21   Pollyann Savoy, MD  Blood Glucose Monitoring Suppl (ACCU-CHEK GUIDE ME) w/Device KIT Use to check blood sugar TID. 01/30/21   Hoy Register, MD  cetirizine (ZYRTEC) 10 MG tablet Take 1 tablet (10 mg total) by mouth daily. 09/07/21    Hoy Register, MD  Continuous Blood Gluc Receiver (DEXCOM G6 RECEIVER) DEVI 1 Device by Does not apply route as directed. 10/30/21   Hoy Register, MD  Continuous Blood Gluc Sensor (DEXCOM G6 SENSOR) MISC 1 Device by Does not apply route as directed. 07/29/21   Shamleffer, Konrad Dolores, MD  Continuous Blood Gluc Transmit (DEXCOM G6 TRANSMITTER) MISC 1 Device by Does not apply route as directed. 07/29/21   Shamleffer, Konrad Dolores, MD  dicyclomine (BENTYL) 10 MG capsule Take 1 capsule (10 mg total) by mouth 3 (three) times daily before meals. 04/14/21   Hoy Register, MD  diphenoxylate-atropine (LOMOTIL) 2.5-0.025 MG tablet Take 1 tablet by mouth 2 (two) times daily. 07/03/21   Hoy Register, MD  doxycycline (ADOXA) 100 MG tablet Take 1 tablet (100 mg total) by mouth 2 (two) times daily. Patient not taking: Reported on 10/22/2021 10/15/21   Hoy Register, MD  ergocalciferol (DRISDOL) 1.25 MG (50000 UT) capsule Take 1 capsule (50,000 Units total) by mouth once a week. Patient taking differently: Take 1,000 Units by mouth daily. 03/11/21   Hoy Register, MD  esomeprazole (NEXIUM) 20 MG capsule Take 1 capsule (20 mg total) by mouth daily. 06/09/21   Hoy Register, MD  fenofibrate (TRICOR) 145 MG tablet Take 1 tablet (145 mg total) by mouth daily. 09/30/21   Hoy Register, MD  gabapentin (NEURONTIN) 300 MG capsule Take 2 capsules (600 mg total) by mouth 2 (two) times daily. Patient not taking: Reported on 10/22/2021 07/23/21   Hoy Register, MD  glucose blood (ACCU-CHEK GUIDE) test strip 1 each by Other route in the morning, at noon, in the evening, and at bedtime. Use as instructed 07/29/21   Shamleffer, Konrad Dolores, MD  hydrOXYzine (ATARAX) 10 MG tablet Take 1 tablet (10 mg total) by mouth 3 (three) times daily as needed. 06/09/21   Hoy Register, MD  insulin aspart (NOVOLOG FLEXPEN) 100 UNIT/ML FlexPen Max Daily 50 units per correction scale 07/30/21   Shamleffer, Konrad Dolores, MD   Insulin Disposable Pump (OMNIPOD 5 G6 INTRO, GEN 5,) KIT 1 Device by Does not apply route every 3 (three) days. 10/08/21   Shamleffer, Konrad Dolores, MD  Insulin Disposable Pump (OMNIPOD 5 G6 POD, GEN 5,) MISC 1 Device by Does not apply route every 3 (three) days. 10/08/21   Shamleffer, Konrad Dolores, MD  insulin glargine (LANTUS SOLOSTAR) 100 UNIT/ML Solostar Pen Inject 24 Units into the skin daily. 07/30/21   Shamleffer, Konrad Dolores, MD  Insulin Pen Needle 32G X 4 MM MISC 1 Device by Does not apply route 4 (four) times daily. 07/30/21   Shamleffer, Konrad Dolores, MD  Lancet Devices [facility]) lancets Use as instructed daily. 11/24/16  Charlott Rakes, MD  meclizine (ANTIVERT) 25 MG tablet TAKE ONE TABLET BY MOUTH THREE TIMES DAILY AS NEEDED dizziness 10/23/21   Charlott Rakes, MD  metoprolol succinate (TOPROL-XL) 25 MG 24 hr tablet Take 1 tablet (25 mg total) by mouth daily. 06/09/21   Charlott Rakes, MD  oxyCODONE-acetaminophen (PERCOCET) 5-325 MG tablet Take 1 tablet by mouth every 4 (four) hours as needed for severe pain or moderate pain. 11/05/21   Daleen Bo, MD  rosuvastatin (CRESTOR) 10 MG tablet Take 1 tablet (10 mg total) by mouth daily. 06/09/21   Charlott Rakes, MD  VENTOLIN HFA 108 (90 Base) MCG/ACT inhaler Inhale 2 puffs into the lungs every 4 (four) hours as needed for wheezing or shortness of breath. 09/04/21   Charlott Rakes, MD    ROS: Neg HEENT Neg resp Neg cardiac Neg GU Neg MS Neg psych Neg neuro  Objective:   Vitals:   11/25/21 1636  BP: (!) 156/88  Pulse: 61  Temp: 98.5 F (36.9 C)  SpO2: 98%  Weight: 138 lb 6.4 oz (62.8 kg)   Exam General appearance : Awake, alert, not in any distress. Speech Clear but pressured. Not toxic looking HEENT: Atraumatic and Normocephalic Neck: Supple, no JVD. No cervical lymphadenopathy.  Chest: Good air entry bilaterally, CTAB.  No rales/rhonchi/wheezing CVS: S1 S2 regular, no murmurs.  Abdomen: Bowel  sounds present, Non tender and not distended with no gaurding, rigidity or rebound. Extremities: B/L Lower Ext shows no edema, both legs are warm to touch Neurology: Awake alert, and oriented X 3, CN II-XII intact, Non focal Skin: multiple excoriated type lesions on face and neck that appear like small boils in various stages of healing  Data Review Lab Results  Component Value Date   HGBA1C 9.2 (A) 10/08/2021   HGBA1C 13.9 (A) 07/29/2021   HGBA1C >15 06/09/2021    Assessment & Plan   1. Type 2 diabetes mellitus with other neurologic complication, with long-term current use of insulin (HCC) Blood sugar 133 today on continuous monitor - Glucose (CBG) - Comprehensive metabolic panel  2. Nausea and vomiting, unspecified vomiting type Non acute abdomen - Comprehensive metabolic panel - Lipase - CBC with Differential/Platelet - HAV, HBV, HCV - promethazine (PHENERGAN) 25 MG tablet; 1/2-1 tab every 4 to 6 hours prn nausea  Dispense: 20 tablet; Refill: 0  3. Urinary frequency Urine with glucose but no leukocytes or nitrites - POCT URINALYSIS DIP (CLINITEK)  4. Boils - CBC with Differential/Platelet - doxycycline (VIBRA-TABS) 100 MG tablet; Take 1 tablet (100 mg total) by mouth 2 (two) times daily.  Dispense: 20 tablet; Refill: 0 - mupirocin ointment (BACTROBAN) 2 %; Apply 1 application. topically 2 (two) times daily.  Dispense: 22 g; Refill: 1  5. Encounter for examination following treatment at hospital    Return for keep your upcoming appt with Dr Margarita Rana.  The patient was given clear instructions to go to ER or return to medical center if symptoms don't improve, worsen or new problems develop. The patient verbalized understanding. The patient was told to call to get lab results if they haven't heard anything in the next week.      Freeman Caldron, [name] [facility] and Lena Elim, [place]   11/25/2021, 4:44 PM

## 2021-11-25 NOTE — Progress Notes (Signed)
133

## 2021-11-26 ENCOUNTER — Ambulatory Visit: Payer: Medicaid Other | Admitting: Internal Medicine

## 2021-11-26 ENCOUNTER — Other Ambulatory Visit: Payer: Self-pay | Admitting: Internal Medicine

## 2021-11-26 LAB — CBC WITH DIFFERENTIAL/PLATELET
Basophils Absolute: 0.1 10*3/uL (ref 0.0–0.2)
Basos: 1 %
EOS (ABSOLUTE): 0.3 10*3/uL (ref 0.0–0.4)
Eos: 3 %
Hematocrit: 45.1 % (ref 34.0–46.6)
Hemoglobin: 14.9 g/dL (ref 11.1–15.9)
Immature Grans (Abs): 0 10*3/uL (ref 0.0–0.1)
Immature Granulocytes: 0 %
Lymphocytes Absolute: 3 10*3/uL (ref 0.7–3.1)
Lymphs: 33 %
MCH: 28.9 pg (ref 26.6–33.0)
MCHC: 33 g/dL (ref 31.5–35.7)
MCV: 88 fL (ref 79–97)
Monocytes Absolute: 0.6 10*3/uL (ref 0.1–0.9)
Monocytes: 6 %
Neutrophils Absolute: 5.1 10*3/uL (ref 1.4–7.0)
Neutrophils: 57 %
Platelets: 190 10*3/uL (ref 150–450)
RBC: 5.15 x10E6/uL (ref 3.77–5.28)
RDW: 12.5 % (ref 11.7–15.4)
WBC: 9.1 10*3/uL (ref 3.4–10.8)

## 2021-11-26 LAB — COMPREHENSIVE METABOLIC PANEL
ALT: 6 IU/L (ref 0–32)
AST: 11 IU/L (ref 0–40)
Albumin/Globulin Ratio: 1.7 (ref 1.2–2.2)
Albumin: 4.5 g/dL (ref 3.8–4.9)
Alkaline Phosphatase: 71 IU/L (ref 44–121)
BUN/Creatinine Ratio: 15 (ref 12–28)
BUN: 9 mg/dL (ref 8–27)
Bilirubin Total: <0.2 mg/dL (ref 0.0–1.2)
CO2: 21 mmol/L (ref 20–29)
Calcium: 9.5 mg/dL (ref 8.7–10.3)
Chloride: 102 mmol/L (ref 96–106)
Creatinine, Ser: 0.61 mg/dL (ref 0.57–1.00)
Globulin, Total: 2.7 g/dL (ref 1.5–4.5)
Glucose: 151 mg/dL — ABNORMAL HIGH (ref 70–99)
Potassium: 4 mmol/L (ref 3.5–5.2)
Sodium: 140 mmol/L (ref 134–144)
Total Protein: 7.2 g/dL (ref 6.0–8.5)
eGFR: 102 mL/min/{1.73_m2} (ref >59)

## 2021-11-26 LAB — LIPASE: Lipase: 34 U/L (ref 14–72)

## 2021-11-27 ENCOUNTER — Other Ambulatory Visit: Payer: Self-pay | Admitting: Family Medicine

## 2021-11-27 DIAGNOSIS — R6889 Other general symptoms and signs: Secondary | ICD-10-CM

## 2021-11-27 DIAGNOSIS — K580 Irritable bowel syndrome with diarrhea: Secondary | ICD-10-CM

## 2021-11-27 DIAGNOSIS — H8113 Benign paroxysmal vertigo, bilateral: Secondary | ICD-10-CM

## 2021-11-30 MED ORDER — OMNIPOD 5 DEXG7G6 INTRO GEN 5 KIT: 1.0000 | PACK | 0 refills | Status: DC

## 2021-12-01 LAB — HAV, HBV, HCV
HCV Ab: NONREACTIVE
Hep B Core Total Ab: NEGATIVE
Hep B Surface Ab, Qual: REACTIVE
Hepatitis B Surface Ag: NEGATIVE
hep A Total Ab: POSITIVE — AB

## 2021-12-01 LAB — HCV INTERPRETATION

## 2021-12-01 LAB — HEPATITIS A ANTIBODY, IGM: Hep A IgM: NEGATIVE

## 2021-12-03 ENCOUNTER — Other Ambulatory Visit: Payer: Self-pay | Admitting: Family Medicine

## 2021-12-03 DIAGNOSIS — E1149 Type 2 diabetes mellitus with other diabetic neurological complication: Secondary | ICD-10-CM

## 2021-12-03 DIAGNOSIS — E1159 Type 2 diabetes mellitus with other circulatory complications: Secondary | ICD-10-CM

## 2021-12-03 NOTE — Telephone Encounter (Signed)
Requested medication (s) are due for refill today - yes  Requested medication (s) are on the active medication list -yes  Future visit scheduled -yes  Last refill: 07/23/21 #180 1RF  Notes to clinic: direction on requested Rx are different than direction on medication list- sent for review of Sig  Requested Prescriptions  Pending Prescriptions Disp Refills   gabapentin (NEURONTIN) 300 MG capsule [Pharmacy Med Name: gabapentin 300 mg capsule] 180 capsule 1    Sig: TAKE ONE CAPSULE BY MOUTH TWICE DAILY     Neurology: Anticonvulsants - gabapentin Passed - 12/03/2021 10:06 AM      Passed - Cr in normal range and within 360 days    Creat  Date Value Ref Range Status  07/05/2016 0.59 0.50 - 1.05 mg/dL Final    Comment:      For patients > or = 60 years of age: The upper reference limit for Creatinine is approximately 13% higher for people identified as African-American.      Creatinine, Ser  Date Value Ref Range Status  11/25/2021 0.61 0.57 - 1.00 mg/dL Final         Passed - Completed PHQ-2 or PHQ-9 in the last 360 days      Passed - Valid encounter within last 12 months    Recent Outpatient Visits           1 week ago Type 2 diabetes mellitus with other neurologic complication, with long-term current use of insulin [facility])   [place] [facility] [place], [place], [place]   2 months ago Type 2 diabetes mellitus with other neurologic complication, with long-term current use of insulin (HCC)   [facility] [place], [place], [place]   5 months ago Type 2 diabetes mellitus with other neurologic complication, with long-term current use of insulin (HCC)   [facility] Hoy Register, MD   8 months ago Annual physical exam   [facility] [place], Odette Horns, MD   10 months ago Trigger ring finger of right hand   [place] [facility] [place], Odette Horns, MD       Future  Appointments             In 6 days Hoy Register, MD [facility]            Signed Prescriptions Disp Refills   metoprolol succinate (TOPROL-XL) 25 MG 24 hr tablet 90 tablet 0    Sig: Take 1 tablet (25 mg total) by mouth daily.     Cardiovascular:  Beta Blockers Failed - 12/03/2021 10:06 AM      Failed - Last BP in normal range    BP Readings from Last 1 Encounters:  11/25/21 (!) 156/88         Passed - Last Heart Rate in normal range    Pulse Readings from Last 1 Encounters:  11/25/21 61         Passed - Valid encounter within last 6 months    Recent Outpatient Visits           1 week ago Type 2 diabetes mellitus with other neurologic complication, with long-term current use of insulin [facility])   [place] [place], [place], [place]   2 months ago Type 2 diabetes mellitus with other neurologic complication, with long-term current use of insulin [facility])   [place] [facility] [place],  [place], [place]   5 months ago Type 2 diabetes mellitus with other neurologic complication, with long-term current use of insulin (HCC)   [place] [facility] [place], Odette Horns, MD   8 months ago Annual physical exam   [place] [facility] Hoy Register, MD   10 months ago Trigger ring finger of right hand   [place] [facility] Hoy Register, MD       Future Appointments             In 6 days Hoy Register, MD [facility]               Requested Prescriptions  Pending Prescriptions Disp Refills   gabapentin (NEURONTIN) 300 MG capsule [Pharmacy Med Name: gabapentin 300 mg capsule] 180 capsule 1    Sig: TAKE ONE CAPSULE BY MOUTH TWICE DAILY     Neurology: Anticonvulsants - gabapentin Passed - 12/03/2021 10:06 AM      Passed - Cr in normal range and within 360 days    Creat  Date Value Ref Range Status   07/05/2016 0.59 0.50 - 1.05 mg/dL Final    Comment:      For patients > or = 60 years of age: The upper reference limit for Creatinine is approximately 13% higher for people identified as African-American.      Creatinine, Ser  Date Value Ref Range Status  11/25/2021 0.61 0.57 - 1.00 mg/dL Final         Passed - Completed PHQ-2 or PHQ-9 in the last 360 days      Passed - Valid encounter within last 12 months    Recent Outpatient Visits           1 week ago Type 2 diabetes mellitus with other neurologic complication, with long-term current use of insulin [facility])   [place] [facility] [place], [place], [place]   2 months ago Type 2 diabetes mellitus with other neurologic complication, with long-term current use of insulin (HCC)   [place] [place], [place], [place]   5 months ago Type 2 diabetes mellitus with other neurologic complication, with long-term current use of insulin (HCC)   [place] [facility] Hoy Register, MD   8 months ago Annual physical exam   [facility] [place], Odette Horns, MD   10 months ago Trigger ring finger of right hand   [place] [facility] [place], Odette Horns, MD       Future Appointments             In 6 days Hoy Register, MD [facility]            Signed Prescriptions Disp Refills   metoprolol succinate (TOPROL-XL) 25 MG 24 hr tablet 90 tablet 0    Sig: Take 1 tablet (25 mg total) by mouth daily.     Cardiovascular:  Beta Blockers Failed - 12/03/2021 10:06 AM      Failed - Last BP in normal range    BP Readings from Last 1 Encounters:  11/25/21 (!) 156/88         Passed - Last Heart Rate in normal range    Pulse Readings from Last 1 Encounters:  11/25/21 61         Passed - Valid encounter within last 6 months    Recent Outpatient Visits  1 week ago Type 2 diabetes mellitus  with other neurologic complication, with long-term current use of insulin [facility])   [place] [facility] [place], [place], [place]   2 months ago Type 2 diabetes mellitus with other neurologic complication, with long-term current use of insulin (HCC)   [facility] [place], Odette Horns, [place]   5 months ago Type 2 diabetes mellitus with other neurologic complication, with long-term current use of insulin (HCC)   [place] [facility] Hoy Register, MD   8 months ago Annual physical exam   [place] [facility] Hoy Register, MD   10 months ago Trigger ring finger of right hand   [place] Hoy Register, MD       Future Appointments             In 6 days Hoy Register, MD [facility]

## 2021-12-03 NOTE — Telephone Encounter (Signed)
Requested Prescriptions  Pending Prescriptions Disp Refills  . gabapentin (NEURONTIN) 300 MG capsule [Pharmacy Med Name: gabapentin 300 mg capsule] 180 capsule 1    Sig: TAKE ONE CAPSULE BY MOUTH TWICE DAILY     Neurology: Anticonvulsants - gabapentin Passed - 12/03/2021 10:06 AM      Passed - Cr in normal range and within 360 days    Creat  Date Value Ref Range Status  07/05/2016 0.59 0.50 - 1.05 mg/dL Final    Comment:      For patients > or = 60 years of age: The upper reference limit for Creatinine is approximately 13% higher for people identified as African-American.      Creatinine, Ser  Date Value Ref Range Status  11/25/2021 0.61 0.57 - 1.00 mg/dL Final         Passed - Completed PHQ-2 or PHQ-9 in the last 360 days      Passed - Valid encounter within last 12 months    Recent Outpatient Visits          1 week ago Type 2 diabetes mellitus with other neurologic complication, with long-term current use of insulin [facility])   [place] [facility] [place], [place], [place]   2 months ago Type 2 diabetes mellitus with other neurologic complication, with long-term current use of insulin (HCC)   [place] [facility] [place], [place], [place]   5 months ago Type 2 diabetes mellitus with other neurologic complication, with long-term current use of insulin (HCC)   [place] [facility] Hoy Register, MD   8 months ago Annual physical exam   [place] [facility] Hoy Register, MD   10 months ago Trigger ring finger of right hand   [place] [facility] Hoy Register, MD      Future Appointments            In 6 days Hoy Register, MD [facility]           . metoprolol succinate (TOPROL-XL) 25 MG 24 hr tablet [Pharmacy Med Name: metoprolol succinate ER 25 mg tablet,extended release 24 hr] 90 tablet 0    Sig: Take 1 tablet (25 mg total) by mouth  daily.     Cardiovascular:  Beta Blockers Failed - 12/03/2021 10:06 AM      Failed - Last BP in normal range    BP Readings from Last 1 Encounters:  11/25/21 (!) 156/88         Passed - Last Heart Rate in normal range    Pulse Readings from Last 1 Encounters:  11/25/21 61         Passed - Valid encounter within last 6 months    Recent Outpatient Visits          1 week ago Type 2 diabetes mellitus with other neurologic complication, with long-term current use of insulin [facility])   [place] [facility] [place], [place], [place]   2 months ago Type 2 diabetes mellitus with other neurologic complication, with long-term current use of insulin (HCC)   [place] [place], [place], [place]   5 months ago Type 2 diabetes mellitus with other neurologic complication, with long-term current use of insulin (HCC)   [place] [facility] Hoy Register, MD   8 months ago Annual physical exam   [facility] [place], [place],  [place]   10 months ago Trigger ring finger of right hand   [place] [facility] Hoy Register, MD      Future Appointments            In 6 days Hoy Register, MD [facility]

## 2021-12-04 ENCOUNTER — Other Ambulatory Visit: Payer: Self-pay | Admitting: Internal Medicine

## 2021-12-09 ENCOUNTER — Ambulatory Visit: Payer: Medicaid Other | Attending: Family Medicine | Admitting: Family Medicine

## 2021-12-09 ENCOUNTER — Encounter: Payer: Self-pay | Admitting: Family Medicine

## 2021-12-09 VITALS — BP 135/79 | HR 83 | Temp 98.4°F | Ht 67.0 in | Wt 140.0 lb

## 2021-12-09 DIAGNOSIS — E1149 Type 2 diabetes mellitus with other diabetic neurological complication: Secondary | ICD-10-CM

## 2021-12-09 DIAGNOSIS — K580 Irritable bowel syndrome with diarrhea: Secondary | ICD-10-CM

## 2021-12-09 DIAGNOSIS — R42 Dizziness and giddiness: Secondary | ICD-10-CM

## 2021-12-09 DIAGNOSIS — B0229 Other postherpetic nervous system involvement: Secondary | ICD-10-CM

## 2021-12-09 DIAGNOSIS — Z8719 Personal history of other diseases of the digestive system: Secondary | ICD-10-CM

## 2021-12-09 DIAGNOSIS — Z794 Long term (current) use of insulin: Secondary | ICD-10-CM

## 2021-12-09 DIAGNOSIS — R110 Nausea: Secondary | ICD-10-CM

## 2021-12-09 MED ORDER — ESOMEPRAZOLE MAGNESIUM 20 MG PO CPDR: 20.0000 mg | DELAYED_RELEASE_CAPSULE | Freq: Every day | ORAL | 1 refills | Status: DC

## 2021-12-09 MED ORDER — GABAPENTIN 300 MG PO CAPS
600.0000 mg | ORAL_CAPSULE | Freq: Two times a day (BID) | ORAL | 6 refills | Status: DC
Start: 2021-12-09 — End: 2022-04-09

## 2021-12-09 MED ORDER — ROSUVASTATIN CALCIUM 10 MG PO TABS: 10.0000 mg | ORAL_TABLET | Freq: Every day | ORAL | 1 refills | Status: DC

## 2021-12-09 MED ORDER — DICYCLOMINE HCL 10 MG PO CAPS: ORAL_CAPSULE | ORAL | 1 refills | Status: DC

## 2021-12-09 NOTE — Progress Notes (Signed)
Still having nausea Dizziness Requesting referral to Neurology. CBG from [facility] meter is 139

## 2021-12-09 NOTE — Progress Notes (Signed)
Subjective:  Patient ID: [name], female    DOB: 1960/12/19  Age: 61 y.o. MRN: [id number]  CC: Nausea   HPI [name] is a 61 y.o. year old female with a history of  type 2 diabetes mellitus (A1c 9.2), hypertension, CHF (EF 60-65%  2-D echo of 08/2021), anxiety, GERD, COVID-19 in 01/2020, greater than 20-pack-year history.  Interval History:  Diabetes is managed by endocrine with last visit in 09/2021. She has had hypoglycemia up to 50 and has notified her endocrinologist.  Will be going in next month for a visit. She does have numbness and tingling in feet , leg and hip pains which is described as a sore feeling She did see cardiology 2 months ago and per notes she needed to follow-up as needed.  She would like to be referred to neurology as she complains of dizziness 'feeling like she needs to hold on tight'. It feels like she is about to pass out but she never does. She checks her sugar and it is normal. She is on meclizine which has been ineffective.  She has had nausea x2 weeks but previously had nausea and vomiting.  Her daughter and grandson have nausea as well and she is wondering if she came down with a stomach bug.  Currently taking some antiemetics she has at home.  She has chronic diarrhea which is unchanged for which she is on Bentyl.  She continues to have left-sided lower rib cage pain which extends from her anterior rib cage to posterior but denies presence of rash.  She endorses that about 5 years ago she did have shingles with the presence of a rash at that time in the same location.  Her daughter has a puppy who nudges her when she has extreme levels of blood sugars like hypoglycemia or hyperglycemia and she would like me to complete a form so she can keep this puppy. Past Medical History:  Diagnosis Date   Anxiety    Arthritis    Chronic systolic heart failure (HCC)    EF 20-25% ECHO 05/2015   COPD (chronic obstructive pulmonary disease) (HCC)     Depression    Diabetes mellitus without complication (HCC)    Type II   Dysrhythmia    pt unsure of name of arrythmia - " my heart rate will drop all of a sudden" -no current treatment - atrial flutter   Fibrillation, atrial (HCC)    Gallstones    GERD (gastroesophageal reflux disease)    Headache(784.0)    migraines   Hypertension    Osteomyelitis (HCC)    R ankle   Rash    arms    Past Surgical History:  Procedure Laterality Date   ANKLE FRACTURE SURGERY Right 2015   CARDIAC CATHETERIZATION N/A 06/24/2015   Procedure: Left Heart Cath and Coronary Angiography;  Surgeon: Belva Crome, MD;  Location: [facility];  Service: Cardiovascular;  Laterality: N/A;   CARPAL TUNNEL RELEASE     bil   CHOLECYSTECTOMY  11/24/2011   Procedure: LAPAROSCOPIC CHOLECYSTECTOMY WITH INTRAOPERATIVE CHOLANGIOGRAM;  Surgeon: Joyice Faster. Cornett, MD;  Location: [facility];  Service: General;  Laterality: N/A;  Laparoscopic Cholecystectomy with Cholangiogram   COLONOSCOPY     ECTOPIC PREGNANCY SURGERY  many yrs ago   HARDWARE REMOVAL Right 07/15/2015   Procedure: RIGHT ANKLE HARDWARE REMOVAL, PLACEMENT OF STIMULAN ANTIBIOTIC BEADS AND PREVENA WOUND VAC.;  Surgeon: Meredith Pel, MD;  Location: [facility];  Service: Orthopedics;  Laterality: Right;   TOOTH EXTRACTION N/A 12/03/2016   Procedure: DENTAL EXTRACTIONS with Alveoloplasty;  Surgeon: Diona Browner, DDS;  Location: [facility];  Service: Oral Surgery;  Laterality: N/A;    Family History  Problem Relation Age of Onset   Diabetes Mother    Lung cancer Father    Heart attack Father 2       CABG x4   Breast cancer Sister        Survivor     Social History   Socioeconomic History   Marital status: Divorced    Spouse name: Not on file   Number of children: Not on file   Years of education: Not on file   Highest education level: Not on file  Occupational History   Not on file  Tobacco Use   Smoking status: Every Day    Packs/day: 1.00     Years: 20.00    Total pack years: 20.00    Types: Cigarettes, E-cigarettes    Start date: 09/19/2016   Smokeless tobacco: Never  Vaping Use   Vaping Use: Every day   Start date: 10/12/2016  Substance and Sexual Activity   Alcohol use: No    Alcohol/week: 0.0 standard drinks of alcohol   Drug use: No   Sexual activity: Not Currently    Birth control/protection: Post-menopausal  Other Topics Concern   Not on file  Social History Narrative   Not on file   Social Determinants of Health   Financial Resource Strain: Not on file  Food Insecurity: Not on file  Transportation Needs: Not on file  Physical Activity: Not on file  Stress: Not on file  Social Connections: Not on file    Allergies  Allergen Reactions   Biaxin [Clarithromycin] Anaphylaxis and Swelling   Clarithromycin Shortness Of Breath and Swelling   Lisinopril Swelling and Cough    Lip edema   Sulfa Antibiotics Anaphylaxis, Shortness Of Breath, Swelling and Hypertension   Chlorthalidone Nausea And Vomiting and Other (See Comments)    Clammy, Tachycardia, Headache    Daptomycin Nausea And Vomiting   Latex Hives and Rash   Amoxicillin-Pot Clavulanate Diarrhea    Has patient had a PCN reaction causing immediate rash, facial/tongue/throat swelling, SOB or lightheadedness with hypotension:No Has patient had a PCN reaction causing severe rash involving mucus membranes or skin necrosis:No Has patient had a PCN reaction that required hospitalization:No Has patient had a PCN reaction occurring within THE LAST 10 YEARS.  #  #  #  YES  #  #  #  If all of the above answers are "NO", then may proceed with Cephalosporin use.    Aspirin Nausea And Vomiting and Rash    On 344m dosage   Cholestyramine Nausea And Vomiting   Dilaudid [Hydromorphone Hcl] Nausea And Vomiting   Lasix [Furosemide] Nausea And Vomiting and Other (See Comments)    Headache     Outpatient Medications Prior to Visit  Medication Sig Dispense Refill    Accu-Chek Softclix Lancets lancets Use to check blood sugar THREE TIMES DAILY 100 each 2   Blood Glucose Monitoring Suppl (ACCU-CHEK GUIDE ME) w/Device KIT Use to check blood sugar TID. 1 kit 0   cetirizine (ZYRTEC) 10 MG tablet Take 1 tablet (10 mg total) by mouth daily. 30 tablet 0   Continuous Blood Gluc Receiver (DEXCOM G6 RECEIVER) DEVI 1 Device by Does not apply route as directed. 1 each 0   Continuous Blood Gluc Sensor (DEXCOM G6 SENSOR) MISC 1  Device by Does not apply route as directed. 9 each 3   Continuous Blood Gluc Transmit (DEXCOM G6 TRANSMITTER) MISC USE AS DIRECTED 1 each 3   diphenoxylate-atropine (LOMOTIL) 2.5-0.025 MG tablet TAKE ONE TABLET BY MOUTH TWICE DAILY 30 tablet 1   doxycycline (ADOXA) 100 MG tablet Take 1 tablet (100 mg total) by mouth 2 (two) times daily. 10 tablet 0   doxycycline (VIBRA-TABS) 100 MG tablet Take 1 tablet (100 mg total) by mouth 2 (two) times daily. 20 tablet 0   ergocalciferol (DRISDOL) 1.25 MG (50000 UT) capsule Take 1 capsule (50,000 Units total) by mouth once a week. (Patient taking differently: Take 1,000 Units by mouth daily.) 12 capsule 1   fenofibrate (TRICOR) 145 MG tablet Take 1 tablet (145 mg total) by mouth daily. 90 tablet 1   FLUoxetine (PROZAC) 40 MG capsule Take 1 capsule (40 mg total) by mouth at bedtime. 90 capsule 0   glucose blood (ACCU-CHEK GUIDE) test strip 1 each by Other route in the morning, at noon, in the evening, and at bedtime. Use as instructed 400 each 3   hydrOXYzine (ATARAX) 10 MG tablet Take 1 tablet (10 mg total) by mouth 3 (three) times daily as needed. 60 tablet 3   insulin aspart (NOVOLOG FLEXPEN) 100 UNIT/ML FlexPen Max Daily 50 units per correction scale 45 mL 2   Insulin Disposable Pump (OMNIPOD 5 G6 INTRO, GEN 5,) KIT 1 Device by Does not apply route every 3 (three) days. 1 kit 0   Insulin Disposable Pump (OMNIPOD 5 G6 POD, GEN 5,) MISC 1 Device by Does not apply route every 3 (three) days. 6 each 3   insulin  glargine (LANTUS SOLOSTAR) 100 UNIT/ML Solostar Pen Inject 24 Units into the skin daily. 30 mL 2   Insulin Pen Needle 32G X 4 MM MISC 1 Device by Does not apply route 4 (four) times daily. 400 each 2   Lancet Devices (ACCU-CHEK SOFTCLIX) lancets Use as instructed daily. 1 each 5   meclizine (ANTIVERT) 25 MG tablet TAKE ONE TABLET BY MOUTH THREE TIMES DAILY AS NEEDED dizziness 90 tablet 0   metoprolol succinate (TOPROL-XL) 25 MG 24 hr tablet Take 1 tablet (25 mg total) by mouth daily. 90 tablet 0   mupirocin ointment (BACTROBAN) 2 % Apply 1 application. topically 2 (two) times daily. 22 g 1   oxyCODONE-acetaminophen (PERCOCET) 5-325 MG tablet Take 1 tablet by mouth every 4 (four) hours as needed for severe pain or moderate pain. 12 tablet 0   promethazine (PHENERGAN) 25 MG tablet 1/2-1 tab every 4 to 6 hours prn nausea 20 tablet 0   VENTOLIN HFA 108 (90 Base) MCG/ACT inhaler Inhale 2 puffs into the lungs every 4 (four) hours as needed for wheezing or shortness of breath. 18 g 2   dicyclomine (BENTYL) 10 MG capsule TAKE ONE CAPSULE BY MOUTH THREE TIMES DAILY BEFORE MEALS 90 capsule 0   esomeprazole (NEXIUM) 20 MG capsule Take 1 capsule (20 mg total) by mouth daily. 90 capsule 1   gabapentin (NEURONTIN) 300 MG capsule TAKE ONE CAPSULE BY MOUTH TWICE DAILY 180 capsule 1   rosuvastatin (CRESTOR) 10 MG tablet Take 1 tablet (10 mg total) by mouth daily. 90 tablet 1   Facility-Administered Medications Prior to Visit  Medication Dose Route Frequency Provider Last Rate Last Admin   regadenoson (LEXISCAN) injection SOLN 0.4 mg  0.4 mg Intravenous Once Dorothy Spark, MD       technetium tetrofosmin (TC-MYOVIEW) injection 32.8  millicurie  23.7 millicurie Intravenous Once PRN Dorothy Spark, MD         ROS Review of Systems  Constitutional:  Negative for activity change and appetite change.  HENT:  Negative for sinus pressure and sore throat.   Respiratory:  Negative for chest tightness,  shortness of breath and wheezing.   Cardiovascular:  Negative for chest pain and palpitations.  Gastrointestinal:  Positive for diarrhea and nausea. Negative for abdominal distention, abdominal pain and constipation.  Genitourinary: Negative.   Musculoskeletal:        See HPI  Neurological:  Positive for dizziness.  Psychiatric/Behavioral:  Negative for behavioral problems and dysphoric mood.     Objective:  BP 135/79   Pulse 83   Temp 98.4 F (36.9 C) (Oral)   Ht _0  (1.702 m)   Wt 140 lb (63.5 kg)   LMP 06/09/2011 Comment: verified BEFORE imaging  SpO2 96%   BMI 21.93 kg/m      12/09/2021    2:13 PM 11/25/2021    4:36 PM 11/05/2021   11:45 PM  BP/Weight  Systolic BP 628 315 176  Diastolic BP 79 88 83  Wt. (Lbs) 140 138.4   BMI 21.93 kg/m2 23.76 kg/m2       Physical Exam Constitutional:      Appearance: She is well-developed.  Cardiovascular:     Rate and Rhythm: Normal rate.     Heart sounds: Normal heart sounds. No murmur heard. Pulmonary:     Effort: Pulmonary effort is normal.     Breath sounds: Normal breath sounds. No wheezing or rales.  Chest:     Chest wall: No tenderness.  Abdominal:     General: Bowel sounds are normal. There is no distension.     Palpations: Abdomen is soft. There is no mass.     Tenderness: There is no abdominal tenderness.  Musculoskeletal:        General: Normal range of motion.     Right lower leg: No edema.     Left lower leg: No edema.  Skin:    Comments: No rash on left thoracic region  Neurological:     Mental Status: She is alert and oriented to person, place, and time.  Psychiatric:        Mood and Affect: Mood normal.        Latest Ref Rng & Units 11/25/2021    4:45 PM 11/05/2021    3:47 PM 08/30/2021    1:53 PM  CMP  Glucose 70 - 99 mg/dL 151  174  239   BUN 8 - 27 mg/dL _1 Creatinine 0.57 - 1.00 mg/dL 0.61  0.55  0.54   Sodium 134 - 144 mmol/L 140  138  136   Potassium 3.5 - 5.2 mmol/L 4.0  3.7  3.7    Chloride 96 - 106 mmol/L 102  108  104   CO2 20 - 29 mmol/L _2 Calcium 8.7 - 10.3 mg/dL 9.5  9.1  9.2   Total Protein 6.0 - 8.5 g/dL 7.2     Total Bilirubin 0.0 - 1.2 mg/dL <0.2     Alkaline Phos 44 - 121 IU/L 71     AST 0 - 40 IU/L 11     ALT 0 - 32 IU/L 6       Lipid Panel     Component Value Date/Time   CHOL 114 06/11/2021 0842  TRIG 316 (H) 06/11/2021 0842   HDL 28 (L) 06/11/2021 0842   CHOLHDL 5.6 (H) 07/31/2019 1004   CHOLHDL 5.1 (H) 07/05/2016 0947   VLDL UNABLE TO CALCULATE IF TRIGLYCERIDE OVER 400 mg/dL 05/24/2016 0415   LDLCALC 39 06/11/2021 0842    CBC    Component Value Date/Time   WBC 9.1 11/25/2021 1645   WBC 9.0 11/05/2021 1547   RBC 5.15 11/25/2021 1645   RBC 4.85 11/05/2021 1547   HGB 14.9 11/25/2021 1645   HCT 45.1 11/25/2021 1645   PLT 190 11/25/2021 1645   MCV 88 11/25/2021 1645   MCH 28.9 11/25/2021 1645   MCH 28.5 11/05/2021 1547   MCHC 33.0 11/25/2021 1645   MCHC 32.2 11/05/2021 1547   RDW 12.5 11/25/2021 1645   LYMPHSABS 3.0 11/25/2021 1645   MONOABS 0.6 11/05/2021 1547   EOSABS 0.3 11/25/2021 1645   BASOSABS 0.1 11/25/2021 1645    Lab Results  Component Value Date   HGBA1C 9.2 (A) 10/08/2021    Assessment & Plan:  1. Type 2 diabetes mellitus with other neurologic complication, with long-term current use of insulin (HCC) Uncontrolled with A1c of 9.2; goal is less than 7.0 She is having some hypoglycemic episodes Has upcoming appointment with endocrine Neuropathy is uncontrolled and I have increased her dose of gabapentin Counseled on Diabetic diet, my plate method, 563 minutes of moderate intensity exercise/week Blood sugar logs with fasting goals of 80-120 mg/dl, random of less than 180 and in the event of sugars less than 60 mg/dl or greater than 400 mg/dl encouraged to notify the clinic. Advised on the need for annual eye exams, annual foot exams, Pneumonia vaccine. - gabapentin (NEURONTIN) 300 MG capsule; Take 2  capsules (600 mg total) by mouth 2 (two) times daily.  Dispense: 120 capsule; Refill: 6 - rosuvastatin (CRESTOR) 10 MG tablet; Take 1 tablet (10 mg total) by mouth daily.  Dispense: 90 tablet; Refill: 1  2. Dizziness Currently on meclizine which has been ineffective She is requesting neurology referral - Ambulatory referral to Neurology  3. Irritable bowel syndrome with diarrhea With ongoing diarrhea which is stable on Bentyl - dicyclomine (BENTYL) 10 MG capsule; TAKE ONE CAPSULE BY MOUTH THREE TIMES DAILY BEFORE MEALS  Dispense: 270 capsule; Refill: 1  4. History of gastroesophageal reflux (GERD) Controlled - esomeprazole (NEXIUM) 20 MG capsule; Take 1 capsule (20 mg total) by mouth daily.  Dispense: 90 capsule; Refill: 1  5. Postherpetic neuralgia Likely due to previous history of shingles No rash at this time hence no indication for antiviral We will increase dose of gabapentin  6. Nausea Possibly viral illness given other family members have similar symptoms Continue antiemetic     Meds ordered this encounter  Medications   gabapentin (NEURONTIN) 300 MG capsule    Sig: Take 2 capsules (600 mg total) by mouth 2 (two) times daily.    Dispense:  120 capsule    Refill:  6    Dose increase   dicyclomine (BENTYL) 10 MG capsule    Sig: TAKE ONE CAPSULE BY MOUTH THREE TIMES DAILY BEFORE MEALS    Dispense:  270 capsule    Refill:  1    This prescription was filled on 11/27/2021. Any refills authorized will be placed on file.   esomeprazole (NEXIUM) 20 MG capsule    Sig: Take 1 capsule (20 mg total) by mouth daily.    Dispense:  90 capsule    Refill:  1    This  prescription was filled on 02/17/2021. Any refills authorized will be placed on file.   rosuvastatin (CRESTOR) 10 MG tablet    Sig: Take 1 tablet (10 mg total) by mouth daily.    Dispense:  90 tablet    Refill:  1    Follow-up: Return in about 6 months (around 06/10/2022) for Chronic medical conditions.        Charlott Rakes, MD, FAAFP. [facility] [place], [place]   12/09/2021, 5:04 PM

## 2021-12-09 NOTE — Patient Instructions (Signed)
Postherpetic Neuralgia Postherpetic neuralgia (PHN) is nerve pain that occurs after a shingles infection. Shingles is a painful rash that appears on one area of the body, usually on the trunk or face. Shingles is caused by the varicella-zoster virus. This is the same virus that causes chickenpox. In people who have had chickenpox, the virus can resurface years later and cause shingles. PHN appears in the same area where you had the shingles rash. The pain usually goes away after the rash disappears. You may have PHN if you continue to have pain 3 months after your shingles rash has gone away. What are the causes? This condition is caused by damage to your nerves due to inflammation from the varicella-zoster virus. The damage makes your nerves overly sensitive. What increases the risk? The following factors may make you more likely to develop this condition: Being older than 60 years of age. Having severe pain before your shingles rash starts. Having a severe rash. Having shingles in and around the eye area. Having a disease or taking medicine that causes you to have a weakened disease-fighting system (immune system). What are the signs or symptoms? The main symptom of this condition is pain. The pain may: Often be severe and may be described as stabbing, burning, shooting, or feeling like an electric shock. Come and go, or it may be there all the time. Be triggered by light touches on the skin or changes in temperature. You may have itching along with the pain. How is this diagnosed? This condition may be diagnosed based on your symptoms and your history of shingles. Lab studies and other diagnostic tests are usually not needed. How is this treated? There is no cure for this condition. Treatment for PHN will focus on pain relief. Over-the-counter pain relievers do not usually relieve PHN pain. You may need to work with a pain specialist. Treatment may include: Anti-seizure medicines to relieve  nerve pain. Antidepressant medicines to help with pain and improve sleep. A numbing patch worn on the skin (lidocaine patch). Strong pain relievers (opioids). Injections of numbing medicine or anti-inflammatory medicines around irritated nerves. Injections of botulinum toxin to block pain signals between nerves and muscles. Follow these instructions at home: Medicines Take over-the-counter and prescription medicines only as told by your health care provider. Ask your health care provider if the medicine prescribed to you: Requires you to avoid driving or using machinery. Can cause constipation. You may need to take these actions to prevent or treat constipation: Drink enough fluid to keep your urine pale yellow. Take over-the-counter or prescription medicines. Eat foods that are high in fiber, such as beans, whole grains, and fresh fruits and vegetables. Limit foods that are high in fat and processed sugars, such as fried or sweet foods. Managing pain  If directed, put ice on the painful area. To do this: Put ice in a plastic bag. Place a towel between your skin and the bag. Leave the ice on for 20 minutes, 2-3 times a day. Remove the ice if your skin turns bright red. This is very important. If you cannot feel pain, heat, or cold, you have a greater risk of damage to the area. Cover sensitive areas with a bandage (dressing) to reduce friction from clothing rubbing on the area. General instructions It may take a long time to recover from PHN. Work closely with your health care provider and develop a good support system at home. You may consider joining a support group. Wear loose, comfortable clothing. Talk   to your health care provider if you feel depressed or desperate. Living with long-term pain can be depressing. Keep all follow-up visits. This is important. How is this prevented? Getting a vaccination for shingles can prevent PHN. The shingles vaccine is recommended for people older  than age 50. It may prevent shingles and may also lower your risk of PHN if you do get shingles. Contact a health care provider if: Your medicine is not helping. You are struggling to manage your pain at home. Get help right away if: You have thoughts about hurting yourself or others. If you ever feel like you may hurt yourself or others, or have thoughts about taking your own life, get help right away. Go to your nearest emergency department or: Call your local emergency services (911 in the [place] Call a suicide crisis helpline, such as the National Suicide Prevention Lifeline at 1-800-273-8255 or 988 in the [place] This is open 24 hours a day in the [place] Text the Crisis Text Line at 741741 (in the [place] Summary Postherpetic neuralgia (PHN) is a very painful disorder that can occur after an episode of shingles. The pain is often severe and may be described as stabbing, burning, shooting, or feeling like an electric shock. Prescription medicines can be helpful in managing persistent pain. Getting a vaccination for shingles can prevent PHN. This vaccine is recommended for people older than age 50. This information is not intended to replace advice given to you by your health care provider. Make sure you discuss any questions you have with your health care provider. Document Revised: 01/07/2021 Document Reviewed: 06/09/2020 Elsevier Patient Education  2023 [employer]  

## 2021-12-15 ENCOUNTER — Other Ambulatory Visit: Payer: Self-pay | Admitting: Family Medicine

## 2021-12-15 DIAGNOSIS — K580 Irritable bowel syndrome with diarrhea: Secondary | ICD-10-CM

## 2021-12-15 DIAGNOSIS — F419 Anxiety disorder, unspecified: Secondary | ICD-10-CM

## 2021-12-15 MED ORDER — ALBUTEROL SULFATE HFA 108 (90 BASE) MCG/ACT IN AERS: 2.0000 | INHALATION_SPRAY | RESPIRATORY_TRACT | 2 refills | Status: DC | PRN

## 2021-12-15 MED ORDER — HYDROXYZINE HCL 10 MG PO TABS: 10.0000 mg | ORAL_TABLET | Freq: Three times a day (TID) | ORAL | 2 refills | Status: DC | PRN

## 2021-12-15 NOTE — Telephone Encounter (Signed)
Requested Prescriptions  Pending Prescriptions Disp Refills  . albuterol (VENTOLIN HFA) 108 (90 Base) MCG/ACT inhaler 18 g 2    Sig: Inhale 2 puffs into the lungs every 4 (four) hours as needed for wheezing or shortness of breath.     Pulmonology:  Beta Agonists 2 Passed - 12/15/2021  3:13 PM      Passed - Last BP in normal range    BP Readings from Last 1 Encounters:  12/09/21 135/79         Passed - Last Heart Rate in normal range    Pulse Readings from Last 1 Encounters:  12/09/21 83         Passed - Valid encounter within last 12 months    Recent Outpatient Visits          6 days ago Dizziness   [facility] [place], [place], [place]   2 weeks ago Type 2 diabetes mellitus with other neurologic complication, with long-term current use of insulin [facility])   [facility] [place], [place], [place]   3 months ago Type 2 diabetes mellitus with other neurologic complication, with long-term current use of insulin (HCC)   [facility] [place], [place], [place]   6 months ago Type 2 diabetes mellitus with other neurologic complication, with long-term current use of insulin (HCC)   [facility] [place], [place] Horns, MD   9 months ago Annual physical exam   [facility] [place], [place], [place]             . diphenoxylate-atropine (LOMOTIL) 2.5-0.025 MG tablet 30 tablet 1    Sig: Take 1 tablet by mouth 2 (two) times daily.     Not Delegated - Gastroenterology:  Antidiarrheals Failed - 12/15/2021  3:13 PM      Failed - This refill cannot be delegated      Passed - Valid encounter within last 12 months    Recent Outpatient Visits          6 days ago Dizziness   [facility] [place], Odette Horns, [place]   2 weeks ago Type 2 diabetes mellitus with other neurologic complication, with long-term current use of insulin [facility])   [facility] [place], [place], [place]   3 months ago Type 2 diabetes mellitus with other neurologic complication, with long-term current use of insulin (HCC)   [facility] [place], Odette Horns, MD   6 months ago Type 2 diabetes mellitus with other neurologic complication, with long-term current use of insulin (HCC)   [facility] [place], [place], [place]   9 months ago Annual physical exam   [facility] [place], [place], [place]             . hydrOXYzine (ATARAX) 10 MG tablet 60 tablet 2    Sig: Take 1 tablet (10 mg total) by mouth 3 (three) times daily as needed.     Ear, Nose, and Throat:  Antihistamines 2 Passed - 12/15/2021  3:13 PM      Passed - Cr in normal range and within 360 days    Creat  Date Value Ref Range Status  07/05/2016 0.59 0.50 - 1.05 mg/dL Final    Comment:      For patients > or = 60 years of age: The upper reference limit for Creatinine  is approximately 13% higher for people identified as African-American.      Creatinine, Ser  Date Value Ref Range Status  11/25/2021 0.61 0.57 - 1.00 mg/dL Final         Passed - Valid encounter within last 12 months    Recent Outpatient Visits          6 days ago Dizziness   [facility] [place], [place], [place]   2 weeks ago Type 2 diabetes mellitus with other neurologic complication, with long-term current use of insulin [facility])   [facility] [place], [place], [place]   3 months ago Type 2 diabetes mellitus with other neurologic complication, with long-term current use of insulin (HCC)   [facility] [place], Odette Horns, MD   6 months ago Type 2 diabetes mellitus with other neurologic complication, with long-term current use of insulin (HCC)   [facility] [place] Register, MD   9 months ago Annual physical exam   [facility] [place], [place]

## 2021-12-15 NOTE — Telephone Encounter (Signed)
Medication Refill - Medication: hydrOXYzine (ATARAX) 10 MG tablet, diphenoxylate-atropine (LOMOTIL) 2.5-0.025 MG tablet, and NTOLIN HFA 108 (90 Base) MCG/ACT inhaler  Has the patient contacted their pharmacy? Yes.   Pt told to contact provider  Preferred Pharmacy (with phone number or street name):  [facility] - [place], [place] - [address]  [address] Phone:  [phone]  Fax:  [phone]     Has the patient been seen for an appointment in the last year OR does the patient have an upcoming appointment? Yes.    Agent: Please be advised that RX refills may take up to 3 business days. We ask that you follow-up with your pharmacy.

## 2021-12-15 NOTE — Telephone Encounter (Signed)
Requested medications are due for refill today.  Yes  Requested medications are on the active medications list.  yes  Last refill. 11/30/2021 #30 1 refill  Future visit scheduled.   no  Notes to clinic.  Medication refill is not delegated.    Requested Prescriptions  Pending Prescriptions Disp Refills   diphenoxylate-atropine (LOMOTIL) 2.5-0.025 MG tablet 30 tablet 1    Sig: Take 1 tablet by mouth 2 (two) times daily.     Not Delegated - Gastroenterology:  Antidiarrheals Failed - 12/15/2021  3:13 PM      Failed - This refill cannot be delegated      Passed - Valid encounter within last 12 months    Recent Outpatient Visits           6 days ago Dizziness   [facility] [place], Odette Horns, MD   2 weeks ago Type 2 diabetes mellitus with other neurologic complication, with long-term current use of insulin [facility])   [facility] [place], [place], [place]   3 months ago Type 2 diabetes mellitus with other neurologic complication, with long-term current use of insulin (HCC)   [facility] [place], Odette Horns, MD   6 months ago Type 2 diabetes mellitus with other neurologic complication, with long-term current use of insulin (HCC)   [facility] [place], [place], [place]   9 months ago Annual physical exam   [facility] [place], Odette Horns, MD              Signed Prescriptions Disp Refills   albuterol (VENTOLIN HFA) 108 (90 Base) MCG/ACT inhaler 18 g 2    Sig: Inhale 2 puffs into the lungs every 4 (four) hours as needed for wheezing or shortness of breath.     Pulmonology:  Beta Agonists 2 Passed - 12/15/2021  3:13 PM      Passed - Last BP in normal range    BP Readings from Last 1 Encounters:  12/09/21 135/79         Passed - Last Heart Rate in normal range    Pulse Readings from Last 1 Encounters:  12/09/21 83         Passed - Valid encounter  within last 12 months    Recent Outpatient Visits           6 days ago Dizziness   [facility] [place], [place], [place]   2 weeks ago Type 2 diabetes mellitus with other neurologic complication, with long-term current use of insulin [facility])   [place] [facility] [place], [place], [place]   3 months ago Type 2 diabetes mellitus with other neurologic complication, with long-term current use of insulin (HCC)   [facility] [place], [place], [place]   6 months ago Type 2 diabetes mellitus with other neurologic complication, with long-term current use of insulin (HCC)   [facility] [place], [place], [place]   9 months ago Annual physical exam   [facility] [place], Odette Horns, MD               hydrOXYzine (ATARAX) 10 MG tablet 60 tablet 2    Sig: Take 1 tablet (10 mg total) by mouth 3 (three) times daily as needed.     Ear, Nose, and Throat:  Antihistamines 2 Passed - 12/15/2021  3:13 PM  Passed - Cr in normal range and within 360 days    Creat  Date Value Ref Range Status  07/05/2016 0.59 0.50 - 1.05 mg/dL Final    Comment:      For patients > or = 60 years of age: The upper reference limit for Creatinine is approximately 13% higher for people identified as African-American.      Creatinine, Ser  Date Value Ref Range Status  11/25/2021 0.61 0.57 - 1.00 mg/dL Final         Passed - Valid encounter within last 12 months    Recent Outpatient Visits           6 days ago Dizziness   [facility] [place], [place], [place]   2 weeks ago Type 2 diabetes mellitus with other neurologic complication, with long-term current use of insulin [facility])   [facility] [place], [place], [place]   3 months ago Type 2 diabetes mellitus with other neurologic complication, with long-term current use of insulin (HCC)   [facility] [place], Odette Horns, MD   6 months ago Type 2 diabetes mellitus with other neurologic complication, with long-term current use of insulin (HCC)   [facility] Hoy Register, MD   9 months ago Annual physical exam   [facility] H[facility], MD

## 2021-12-16 MED ORDER — DIPHENOXYLATE-ATROPINE 2.5-0.025 MG PO TABS: 1.0000 | ORAL_TABLET | Freq: Two times a day (BID) | ORAL | 1 refills | Status: DC

## 2021-12-22 MED ORDER — DEXCOM G7 SENSOR MISC: 1.0000 | 1 refills | Status: DC

## 2022-01-01 ENCOUNTER — Other Ambulatory Visit: Payer: Self-pay

## 2022-01-01 NOTE — Telephone Encounter (Signed)
Patient will stop by the office and pick up a Dexcom G7 reader.

## 2022-01-04 ENCOUNTER — Ambulatory Visit: Payer: Medicaid Other | Admitting: Neurology

## 2022-01-04 ENCOUNTER — Encounter: Payer: Self-pay | Admitting: Neurology

## 2022-01-04 ENCOUNTER — Other Ambulatory Visit: Payer: Self-pay | Admitting: Family Medicine

## 2022-01-04 VITALS — BP 127/78 | HR 64 | Ht 67.0 in | Wt 138.0 lb

## 2022-01-04 DIAGNOSIS — R519 Headache, unspecified: Secondary | ICD-10-CM | POA: Diagnosis not present

## 2022-01-04 DIAGNOSIS — R42 Dizziness and giddiness: Secondary | ICD-10-CM | POA: Diagnosis not present

## 2022-01-04 DIAGNOSIS — M792 Neuralgia and neuritis, unspecified: Secondary | ICD-10-CM | POA: Diagnosis not present

## 2022-01-04 DIAGNOSIS — K580 Irritable bowel syndrome with diarrhea: Secondary | ICD-10-CM

## 2022-01-04 DIAGNOSIS — B0229 Other postherpetic nervous system involvement: Secondary | ICD-10-CM | POA: Diagnosis not present

## 2022-01-04 DIAGNOSIS — H8110 Benign paroxysmal vertigo, unspecified ear: Secondary | ICD-10-CM

## 2022-01-04 MED ORDER — PREGABALIN 75 MG PO CAPS: 75.0000 mg | ORAL_CAPSULE | Freq: Two times a day (BID) | ORAL | 0 refills | Status: DC

## 2022-01-04 MED ORDER — AMITRIPTYLINE HCL 25 MG PO TABS: 25.0000 mg | ORAL_TABLET | Freq: Every day | ORAL | 0 refills | Status: DC

## 2022-01-04 NOTE — Progress Notes (Signed)
[facility]  PATIENT: [name] DOB: [birth year]  REQUESTING CLINICIAN: Charlott Rakes, MD HISTORY FROM: Patient  REASON FOR VISIT: Headaches/Dizziness/Neuropathic pain    HISTORICAL  CHIEF COMPLAINT:  Chief Complaint  Patient presents with   New Patient (Initial Visit)    Rm 12, with  daughter  NP internal referral for Dizziness C/o dizzy spells and daily HEADACHES     HISTORY OF PRESENT ILLNESS:  This is a 61 year old woman past medical history of COPD, diabetes mellitus, neuropathy, depression, hypertension and hyperlipidemia who is presenting with complaint of dizziness, that she described as room spinning sensation, can last 3 seconds up to a minute.  During this time, she feels really scared, and has nausea.  On top of that she also complained of headaches, reported daily headache, sometimes she can wake up with headaches.  Described headache as aching pain.  In the past, she had tried Amitriptyline with good results with the headaches.  Patient also complain of neuropathic pain mostly on the left side.  She did have a shingles outbreak on the left side of her back and now in the same region she has itchiness, numbness and stabbing pain.  She reported her neuropathic pain got worse since starting her insulin and controlling her diabetes.  Currently her A1c went from 13 to almost 7.     OTHER MEDICAL CONDITIONS: COPD PAD, hypertension, hyperlipidemia, diabetes mellitus, depression and neuropathy, shingles   REVIEW OF SYSTEMS: Full 14 system review of systems performed and negative with exception of: as noted in the HPI   ALLERGIES: Allergies  Allergen Reactions   Biaxin [Clarithromycin] Anaphylaxis and Swelling   Clarithromycin Shortness Of Breath and Swelling   Lisinopril Swelling and Cough    Lip edema   Sulfa Antibiotics Anaphylaxis, Shortness Of Breath, Swelling and Hypertension   Chlorthalidone Nausea And Vomiting and Other (See  Comments)    Clammy, Tachycardia, Headache    Daptomycin Nausea And Vomiting   Latex Hives and Rash   Amoxicillin-Pot Clavulanate Diarrhea    Has patient had a PCN reaction causing immediate rash, facial/tongue/throat swelling, SOB or lightheadedness with hypotension:No Has patient had a PCN reaction causing severe rash involving mucus membranes or skin necrosis:No Has patient had a PCN reaction that required hospitalization:No Has patient had a PCN reaction occurring within THE LAST 10 YEARS.  #  #  #  YES  #  #  #  If all of the above answers are "NO", then may proceed with Cephalosporin use.    Aspirin Nausea And Vomiting and Rash    On $R'325mg'SW$  dosage   Cholestyramine Nausea And Vomiting   Dilaudid [Hydromorphone Hcl] Nausea And Vomiting   Lasix [Furosemide] Nausea And Vomiting and Other (See Comments)    Headache     HOME MEDICATIONS: Outpatient Medications Prior to Visit  Medication Sig Dispense Refill   Accu-Chek Softclix Lancets lancets Use to check blood sugar THREE TIMES DAILY 100 each 2   albuterol (VENTOLIN HFA) 108 (90 Base) MCG/ACT inhaler Inhale 2 puffs into the lungs every 4 (four) hours as needed for wheezing or shortness of breath. 18 g 2   Blood Glucose Monitoring Suppl (ACCU-CHEK GUIDE ME) w/Device KIT Use to check blood sugar TID. 1 kit 0   cetirizine (ZYRTEC) 10 MG tablet Take 1 tablet (10 mg total) by mouth daily. 30 tablet 0   Continuous Blood Gluc Receiver (DEXCOM G6 RECEIVER) DEVI 1 Device by Does not apply route as directed. 1 each  0   Continuous Blood Gluc Sensor (DEXCOM G7 SENSOR) MISC 1 Device by Does not apply route as directed. 9 each 1   Continuous Blood Gluc Transmit (DEXCOM G6 TRANSMITTER) MISC USE AS DIRECTED 1 each 3   dicyclomine (BENTYL) 10 MG capsule TAKE ONE CAPSULE BY MOUTH THREE TIMES DAILY BEFORE MEALS 270 capsule 1   diphenoxylate-atropine (LOMOTIL) 2.5-0.025 MG tablet Take 1 tablet by mouth 2 (two) times daily. 30 tablet 1   doxycycline  (ADOXA) 100 MG tablet Take 1 tablet (100 mg total) by mouth 2 (two) times daily. 10 tablet 0   doxycycline (VIBRA-TABS) 100 MG tablet Take 1 tablet (100 mg total) by mouth 2 (two) times daily. 20 tablet 0   ergocalciferol (DRISDOL) 1.25 MG (50000 UT) capsule Take 1 capsule (50,000 Units total) by mouth once a week. (Patient taking differently: Take 1,000 Units by mouth daily.) 12 capsule 1   esomeprazole (NEXIUM) 20 MG capsule Take 1 capsule (20 mg total) by mouth daily. 90 capsule 1   fenofibrate (TRICOR) 145 MG tablet Take 1 tablet (145 mg total) by mouth daily. 90 tablet 1   FLUoxetine (PROZAC) 40 MG capsule Take 1 capsule (40 mg total) by mouth at bedtime. 90 capsule 0   gabapentin (NEURONTIN) 300 MG capsule Take 2 capsules (600 mg total) by mouth 2 (two) times daily. 120 capsule 6   glucose blood (ACCU-CHEK GUIDE) test strip 1 each by Other route in the morning, at noon, in the evening, and at bedtime. Use as instructed 400 each 3   hydrOXYzine (ATARAX) 10 MG tablet Take 1 tablet (10 mg total) by mouth 3 (three) times daily as needed. 60 tablet 2   insulin aspart (NOVOLOG FLEXPEN) 100 UNIT/ML FlexPen Max Daily 50 units per correction scale 45 mL 2   Insulin Disposable Pump (OMNIPOD 5 G6 INTRO, GEN 5,) KIT 1 Device by Does not apply route every 3 (three) days. 1 kit 0   Insulin Disposable Pump (OMNIPOD 5 G6 POD, GEN 5,) MISC 1 Device by Does not apply route every 3 (three) days. 6 each 3   insulin glargine (LANTUS SOLOSTAR) 100 UNIT/ML Solostar Pen Inject 24 Units into the skin daily. 30 mL 2   Insulin Pen Needle 32G X 4 MM MISC 1 Device by Does not apply route 4 (four) times daily. 400 each 2   Lancet Devices (ACCU-CHEK SOFTCLIX) lancets Use as instructed daily. 1 each 5   meclizine (ANTIVERT) 25 MG tablet TAKE ONE TABLET BY MOUTH THREE TIMES DAILY AS NEEDED dizziness 90 tablet 0   metoprolol succinate (TOPROL-XL) 25 MG 24 hr tablet Take 1 tablet (25 mg total) by mouth daily. 90 tablet 0    mupirocin ointment (BACTROBAN) 2 % Apply 1 application. topically 2 (two) times daily. 22 g 1   oxyCODONE-acetaminophen (PERCOCET) 5-325 MG tablet Take 1 tablet by mouth every 4 (four) hours as needed for severe pain or moderate pain. 12 tablet 0   promethazine (PHENERGAN) 25 MG tablet 1/2-1 tab every 4 to 6 hours prn nausea 20 tablet 0   rosuvastatin (CRESTOR) 10 MG tablet Take 1 tablet (10 mg total) by mouth daily. 90 tablet 1   Facility-Administered Medications Prior to Visit  Medication Dose Route Frequency Provider Last Rate Last Admin   regadenoson (LEXISCAN) injection SOLN 0.4 mg  0.4 mg Intravenous Once Dorothy Spark, MD       technetium tetrofosmin (TC-MYOVIEW) injection 28.3 millicurie  66.2 millicurie Intravenous Once PRN Dorothy Spark,  MD        PAST MEDICAL HISTORY: Past Medical History:  Diagnosis Date   Anxiety    Arthritis    Chronic systolic heart failure (HCC)    EF 20-25% ECHO 05/2015   COPD (chronic obstructive pulmonary disease) (HCC)    Depression    Diabetes mellitus without complication (HCC)    Type II   Dysrhythmia    pt unsure of name of arrythmia - " my heart rate will drop all of a sudden" -no current treatment - atrial flutter   Fibrillation, atrial (HCC)    Gallstones    GERD (gastroesophageal reflux disease)    Headache(784.0)    migraines   Hypertension    Osteomyelitis (HCC)    R ankle   Rash    arms    PAST SURGICAL HISTORY: Past Surgical History:  Procedure Laterality Date   ANKLE FRACTURE SURGERY Right 2015   CARDIAC CATHETERIZATION N/A 06/24/2015   Procedure: Left Heart Cath and Coronary Angiography;  Surgeon: Belva Crome, MD;  Location: [facility];  Service: Cardiovascular;  Laterality: N/A;   CARPAL TUNNEL RELEASE     bil   CHOLECYSTECTOMY  11/24/2011   Procedure: LAPAROSCOPIC CHOLECYSTECTOMY WITH INTRAOPERATIVE CHOLANGIOGRAM;  Surgeon: Joyice Faster. Cornett, MD;  Location: [facility];  Service: General;  Laterality: N/A;   Laparoscopic Cholecystectomy with Cholangiogram   COLONOSCOPY     ECTOPIC PREGNANCY SURGERY  many yrs ago   HARDWARE REMOVAL Right 07/15/2015   Procedure: RIGHT ANKLE HARDWARE REMOVAL, PLACEMENT OF STIMULAN ANTIBIOTIC BEADS AND PREVENA WOUND VAC.;  Surgeon: Meredith Pel, MD;  Location: [facility];  Service: Orthopedics;  Laterality: Right;   TOOTH EXTRACTION N/A 12/03/2016   Procedure: DENTAL EXTRACTIONS with Alveoloplasty;  Surgeon: Diona Browner, DDS;  Location: [facility];  Service: Oral Surgery;  Laterality: N/A;    FAMILY HISTORY: Family History  Problem Relation Age of Onset   Diabetes Mother    Lung cancer Father    Heart attack Father 8       CABG x4   Breast cancer Sister        Survivor     SOCIAL HISTORY: Social History   Socioeconomic History   Marital status: Divorced    Spouse name: Not on file   Number of children: Not on file   Years of education: Not on file   Highest education level: Not on file  Occupational History   Not on file  Tobacco Use   Smoking status: Every Day    Packs/day: 1.00    Years: 20.00    Total pack years: 20.00    Types: Cigarettes, E-cigarettes    Start date: 09/19/2016   Smokeless tobacco: Never  Vaping Use   Vaping Use: Every day   Start date: 10/12/2016  Substance and Sexual Activity   Alcohol use: No    Alcohol/week: 0.0 standard drinks of alcohol   Drug use: No   Sexual activity: Not Currently    Birth control/protection: Post-menopausal  Other Topics Concern   Not on file  Social History Narrative   Not on file   Social Determinants of Health   Financial Resource Strain: Not on file  Food Insecurity: Not on file  Transportation Needs: Not on file  Physical Activity: Not on file  Stress: Not on file  Social Connections: Not on file  Intimate Partner Violence: Not on file    PHYSICAL EXAM  GENERAL EXAM/CONSTITUTIONAL: Vitals:  Vitals:   01/04/22 1418  BP: 127/78  Pulse: 64  Weight: 138 lb (62.6 kg)  Height:  $Remove'5\' 7"'[id number]$  (1.702 m)   Body mass index is 21.61 kg/m. Wt Readings from Last 3 Encounters:  01/04/22 138 lb (62.6 kg)  12/09/21 140 lb (63.5 kg)  11/25/21 138 lb 6.4 oz (62.8 kg)   Patient is in no distress; well developed, nourished and groomed; neck is supple  EYES: Pupils round and reactive to light, Visual fields full to confrontation, Extraocular movements intacts,   MUSCULOSKELETAL: Gait, strength, tone, movements noted in Neurologic exam below  NEUROLOGIC: MENTAL STATUS:      No data to display         awake, alert, oriented to person, place and time recent and remote memory intact normal attention and concentration language fluent, comprehension intact, naming intact fund of knowledge appropriate  CRANIAL NERVE:  2nd, 3rd, 4th, 6th - pupils equal and reactive to light, visual fields full to confrontation, extraocular muscles intact, no nystagmus 5th - facial sensation symmetric 7th - facial strength symmetric 8th - hearing intact 9th - palate elevates symmetrically, uvula midline 11th - shoulder shrug symmetric 12th - tongue protrusion midline  MOTOR:  normal bulk and tone, full strength in the BUE, BLE  SENSORY:  normal and symmetric to light touch, pinprick, vibration  COORDINATION:  finger-nose-finger, fine finger movements normal  REFLEXES:  deep tendon reflexes present and symmetric  GAIT/STATION:  normal    DIAGNOSTIC DATA (LABS, IMAGING, TESTING) - I reviewed patient records, labs, notes, testing and imaging myself where available.  Lab Results  Component Value Date   WBC 9.1 11/25/2021   HGB 14.9 11/25/2021   HCT 45.1 11/25/2021   MCV 88 11/25/2021   PLT 190 11/25/2021      Component Value Date/Time   NA 140 11/25/2021 1645   K 4.0 11/25/2021 1645   CL 102 11/25/2021 1645   CO2 21 11/25/2021 1645   GLUCOSE 151 (H) 11/25/2021 1645   GLUCOSE 174 (H) 11/05/2021 1547   BUN 9 11/25/2021 1645   CREATININE 0.61 11/25/2021 1645    CREATININE 0.59 07/05/2016 0947   CALCIUM 9.5 11/25/2021 1645   PROT 7.2 11/25/2021 1645   ALBUMIN 4.5 11/25/2021 1645   AST 11 11/25/2021 1645   ALT 6 11/25/2021 1645   ALKPHOS 71 11/25/2021 1645   BILITOT <0.2 11/25/2021 1645   GFRNONAA >60 11/05/2021 1547   GFRNONAA >89 07/05/2016 0947   GFRAA 114 07/11/2020 1002   GFRAA >89 07/05/2016 0947   Lab Results  Component Value Date   CHOL 114 06/11/2021   HDL 28 (L) 06/11/2021   LDLCALC 39 06/11/2021   TRIG 316 (H) 06/11/2021   CHOLHDL 5.6 (H) 07/31/2019   Lab Results  Component Value Date   HGBA1C 9.2 (A) 10/08/2021   No results found for: "VITAMINB12" Lab Results  Component Value Date   TSH 0.997 03/09/2021     ASSESSMENT AND PLAN  61 y.o. year old female with medical conditions including hypertension, hyperlipidemia, diabetes mellitus, COPD and postherpetic neuralgia who is presenting with complaint of headaches, likely tension type headache but cannot rule out migraines.  She also complained of dizziness likely BPPV, lasting seconds but sometimes can last up to a minute.  She did try meclizine in the past without clear benefit.  At this time I will start by obtaining MRI brain without contrast and also restart her on Amitriptyline since it was helpful for the patient in the past.  For her neuropathic pain  and postherpetic neuralgia, I will start patient on pregabalin 75 mg twice daily and will increase as tolerated.  In the past she has tried gabapentin without clear benefit.  Continue other medication, continue follow-up with your PCP and return in 3 months or sooner if worse.  She voices understanding.   1. Nonintractable headache, unspecified chronicity pattern, unspecified headache type   2. Neuropathic pain   3. Postherpetic neuralgia   4. Dizziness   5. Benign paroxysmal positional vertigo, unspecified laterality      Patient Instructions  MRI brain without contrast Continue current medication Trial of  pregabalin for neuropathic pain in her postherpetic neuralgia Trial of amitriptyline for her migraines Follow-up in 3 months.  Orders Placed This Encounter  Procedures   MR BRAIN WO CONTRAST    Meds ordered this encounter  Medications   pregabalin (LYRICA) 75 MG capsule    Sig: Take 1 capsule (75 mg total) by mouth 2 (two) times daily.    Dispense:  180 capsule    Refill:  0   amitriptyline (ELAVIL) 25 MG tablet    Sig: Take 1 tablet (25 mg total) by mouth at bedtime.    Dispense:  90 tablet    Refill:  0    Return in about 3 months (around 04/06/2022).    Alric Ran, MD 01/04/2022, 2:56 PM  [facility] [address], [place], [place] [address] [phone]

## 2022-01-04 NOTE — Patient Instructions (Addendum)
MRI brain without contrast Continue current medications Trial of pregabalin for neuropathic pain in her postherpetic neuralgia Trial of amitriptyline for her migraines Follow-up in 3 months.

## 2022-01-05 ENCOUNTER — Telehealth: Payer: Self-pay | Admitting: Emergency Medicine

## 2022-01-05 ENCOUNTER — Telehealth: Payer: Self-pay | Admitting: Neurology

## 2022-01-05 NOTE — Telephone Encounter (Signed)
[employer] medicaid Berkley Harvey: [id number] exp. 01/05/22-02/19/22 sent to GI

## 2022-01-05 NOTE — Telephone Encounter (Signed)
Copied from CRM [id number]. Topic: General - Other >> Jan 05, 2022  9:22 AM Everette C wrote: Reason for CRM: The patient has called regarding their service animal documentation   Question 2 was checked "no" indicating the the patient is not disabled   The patient is disabled and would like the document corrected to properly indicate that they are disabled  The patient shares that they are disabled and receive disability assistance due to their diabetes and low vision   [employer] will be resubmitting paperwork   Please contact further if needed

## 2022-01-06 NOTE — Telephone Encounter (Signed)
Pt states that she is disabled and the question was answered incorrectly on the form. Form will be placed in folder and patient ill be called once paperwork is changed.

## 2022-01-06 NOTE — Telephone Encounter (Signed)
Vm was left informing patient of PCP response.

## 2022-01-06 NOTE — Telephone Encounter (Signed)
No, she is not disabled.  Diabetes does not count as a disability.  If she has some other paperwork from Social Security indicating she is disabled I am happy to make the change.

## 2022-01-17 ENCOUNTER — Ambulatory Visit
Admission: RE | Admit: 2022-01-17 | Discharge: 2022-01-17 | Disposition: A | Discharge status: Home / Self Care | Payer: Medicaid Other | Source: Ambulatory Visit | Attending: Neurology | Admitting: Neurology

## 2022-01-17 DIAGNOSIS — R519 Headache, unspecified: Secondary | ICD-10-CM

## 2022-01-17 DIAGNOSIS — R42 Dizziness and giddiness: Secondary | ICD-10-CM

## 2022-01-18 ENCOUNTER — Other Ambulatory Visit: Payer: Self-pay | Admitting: Neurology

## 2022-01-18 MED ORDER — ALPRAZOLAM 1 MG PO TABS: ORAL_TABLET | ORAL | 0 refills | Status: DC

## 2022-01-18 NOTE — Telephone Encounter (Signed)
Patient was unable to complete MRI brain due to claustrophobia. She is requesting a sedative medication to have on hand prior to rescheduling the scan. She confirmed her allergy list is correct. She is aware a prescription for alprazolam will be sent to Dr. Teresa Coombs for approval. She verbalized understanding that she will not be able to drive that day.

## 2022-01-18 NOTE — Telephone Encounter (Signed)
Pt is calling and requesting medication for her MRI on AUG 1. Pt was schedule for a MRI yesterday but she asked them to stop until she could get some medication. Pt is requesting medication be sent to [facility].

## 2022-01-19 ENCOUNTER — Other Ambulatory Visit: Payer: Self-pay | Admitting: Family Medicine

## 2022-01-19 DIAGNOSIS — H8113 Benign paroxysmal vertigo, bilateral: Secondary | ICD-10-CM

## 2022-01-19 DIAGNOSIS — K580 Irritable bowel syndrome with diarrhea: Secondary | ICD-10-CM

## 2022-01-20 ENCOUNTER — Other Ambulatory Visit: Payer: Self-pay | Admitting: Family Medicine

## 2022-01-20 ENCOUNTER — Other Ambulatory Visit: Payer: Self-pay | Admitting: Physician Assistant

## 2022-01-20 ENCOUNTER — Encounter: Payer: Self-pay | Admitting: Physician Assistant

## 2022-01-20 ENCOUNTER — Encounter: Payer: Self-pay | Admitting: Neurology

## 2022-01-20 DIAGNOSIS — L0292 Furuncle, unspecified: Secondary | ICD-10-CM

## 2022-01-20 DIAGNOSIS — H8113 Benign paroxysmal vertigo, bilateral: Secondary | ICD-10-CM

## 2022-01-20 MED ORDER — DOXYCYCLINE HYCLATE 100 MG PO TABS: 100.0000 mg | ORAL_TABLET | Freq: Two times a day (BID) | ORAL | 0 refills | Status: DC

## 2022-01-21 ENCOUNTER — Other Ambulatory Visit: Payer: Self-pay | Admitting: Physician Assistant

## 2022-01-21 ENCOUNTER — Other Ambulatory Visit: Payer: Self-pay | Admitting: Family Medicine

## 2022-01-21 ENCOUNTER — Other Ambulatory Visit: Payer: Self-pay | Admitting: Neurology

## 2022-01-21 DIAGNOSIS — F419 Anxiety disorder, unspecified: Secondary | ICD-10-CM

## 2022-01-21 MED ORDER — DEXCOM G6 TRANSMITTER MISC: 6 refills | Status: DC

## 2022-01-21 MED ORDER — PREGABALIN 150 MG PO CAPS
150.0000 mg | ORAL_CAPSULE | Freq: Two times a day (BID) | ORAL | 11 refills | Status: DC
Start: 2022-01-21 — End: 2022-02-08

## 2022-01-21 MED ORDER — DEXCOM G7 SENSOR MISC
1.0000 | 4 refills | Status: DC
Start: 2022-01-21 — End: 2022-07-07

## 2022-01-21 NOTE — Progress Notes (Signed)
Patient reports Lyrica 75 mg not enough to control her pain, will increase to 150 mg BID.

## 2022-01-22 MED ORDER — MECLIZINE HCL 25 MG PO TABS: 25.0000 mg | ORAL_TABLET | Freq: Two times a day (BID) | ORAL | 0 refills | Status: DC | PRN

## 2022-01-26 ENCOUNTER — Telehealth: Payer: Self-pay | Admitting: Nutrition

## 2022-01-26 ENCOUNTER — Other Ambulatory Visit: Payer: Medicaid Other

## 2022-01-26 NOTE — Telephone Encounter (Signed)
LVM to call back to schedule pump training date.

## 2022-01-31 ENCOUNTER — Ambulatory Visit
Admission: RE | Admit: 2022-01-31 | Discharge: 2022-01-31 | Disposition: A | Discharge status: Home / Self Care | Payer: Medicaid Other | Source: Ambulatory Visit | Attending: Neurology | Admitting: Neurology

## 2022-01-31 DIAGNOSIS — R42 Dizziness and giddiness: Secondary | ICD-10-CM | POA: Diagnosis not present

## 2022-01-31 DIAGNOSIS — R519 Headache, unspecified: Secondary | ICD-10-CM | POA: Diagnosis not present

## 2022-02-02 ENCOUNTER — Other Ambulatory Visit: Payer: Self-pay | Admitting: Neurology

## 2022-02-02 DIAGNOSIS — R42 Dizziness and giddiness: Secondary | ICD-10-CM

## 2022-02-05 ENCOUNTER — Other Ambulatory Visit: Payer: Self-pay | Admitting: Family Medicine

## 2022-02-05 DIAGNOSIS — K580 Irritable bowel syndrome with diarrhea: Secondary | ICD-10-CM

## 2022-02-05 DIAGNOSIS — F419 Anxiety disorder, unspecified: Secondary | ICD-10-CM

## 2022-02-05 NOTE — Telephone Encounter (Signed)
Requested medication (s) are due for refill today: no  Requested medication (s) are on the active medication list: yes  Last refill:  last refill 02/05/22  Future visit scheduled: no  Notes to clinic:  not delegated per protocol. Unable to refuse medication. Rxs filled 02/05/22.      Requested Prescriptions  Pending Prescriptions Disp Refills   hydrOXYzine (ATARAX) 10 MG tablet [Pharmacy Med Name: hydroxyzine HCl 10 mg tablet] 60 tablet 2    Sig: Take 1 tablet (10 mg total) by mouth 3 (three) times daily as needed.     Ear, Nose, and Throat:  Antihistamines 2 Passed - 02/05/2022  2:53 PM      Passed - Cr in normal range and within 360 days    Creat  Date Value Ref Range Status  07/05/2016 0.59 0.50 - 1.05 mg/dL Final    Comment:      For patients > or = 60 years of age: The upper reference limit for Creatinine is approximately 13% higher for people identified as African-American.      Creatinine, Ser  Date Value Ref Range Status  11/25/2021 0.61 0.57 - 1.00 mg/dL Final         Passed - Valid encounter within last 12 months    Recent Outpatient Visits           1 month ago Dizziness   [facility], [place], [place]   2 months ago Type 2 diabetes mellitus with other neurologic complication, with long-term current use of insulin [facility])   [facility] [place], [place], [place]   5 months ago Type 2 diabetes mellitus with other neurologic complication, with long-term current use of insulin (HCC)   [facility] [place], [place], [place]   8 months ago Type 2 diabetes mellitus with other neurologic complication, with long-term current use of insulin (HCC)   [facility] [place], Odette Horns, [place]   11 months ago Annual physical exam   [facility] [place], [place], [place]               diphenoxylate-atropine (LOMOTIL) 2.5-0.025 MG tablet  [Pharmacy Med Name: diphenoxylate-atropine 2.5 mg-0.025 mg tablet] 30 tablet 1    Sig: TAKE ONE TABLET BY MOUTH TWICE DAILY     Not Delegated - Gastroenterology:  Antidiarrheals Failed - 02/05/2022  2:53 PM      Failed - This refill cannot be delegated      Passed - Valid encounter within last 12 months    Recent Outpatient Visits           1 month ago Dizziness   [facility] [place], Odette Horns, [place]   2 months ago Type 2 diabetes mellitus with other neurologic complication, with long-term current use of insulin [facility])   [facility] [place], [place], [place]   5 months ago Type 2 diabetes mellitus with other neurologic complication, with long-term current use of insulin (HCC)   [facility] [place], [place], [place]   8 months ago Type 2 diabetes mellitus with other neurologic complication, with long-term current use of insulin (HCC)   [facility] H[place] Register, [place]   11 months ago Annual physical exam   [facility] Hoy Register, [place]

## 2022-02-08 ENCOUNTER — Other Ambulatory Visit: Payer: Self-pay | Admitting: Neurology

## 2022-02-08 MED ORDER — PREGABALIN 150 MG PO CAPS: 150.0000 mg | ORAL_CAPSULE | Freq: Two times a day (BID) | ORAL | 11 refills | Status: DC

## 2022-02-08 NOTE — Telephone Encounter (Signed)
Pt is requesting a refill for pregabalin (LYRICA) 150 MG capsule.  Pharmacy: [facility] [phone]

## 2022-02-08 NOTE — Telephone Encounter (Signed)
Verify Drug Registry For Pregabalin 75 Mg Capsule Last Filled: 01/04/2022 Quantity: 60 capsules for 30 days Last appointment: 01/04/2022 Next appointment: 04/08/2022

## 2022-02-08 NOTE — Addendum Note (Signed)
Addended by: Christophe Louis E on: 02/08/2022 04:15 PM   Modules accepted: Orders

## 2022-02-10 ENCOUNTER — Telehealth: Payer: Self-pay | Admitting: Neurology

## 2022-02-10 NOTE — Telephone Encounter (Signed)
[employer] medicaid Berkley Harvey: [id number] 02/10/22-03/27/22 sent to GI

## 2022-02-17 ENCOUNTER — Other Ambulatory Visit: Payer: Self-pay

## 2022-03-03 ENCOUNTER — Encounter: Payer: Self-pay | Admitting: Neurology

## 2022-03-05 ENCOUNTER — Telehealth: Payer: Self-pay | Admitting: Neurology

## 2022-03-05 ENCOUNTER — Ambulatory Visit
Admission: RE | Admit: 2022-03-05 | Discharge: 2022-03-05 | Disposition: A | Discharge status: Home / Self Care | Payer: Medicaid Other | Source: Ambulatory Visit | Attending: Neurology | Admitting: Neurology

## 2022-03-05 DIAGNOSIS — R42 Dizziness and giddiness: Secondary | ICD-10-CM

## 2022-03-05 MED ORDER — ALPRAZOLAM 0.5 MG PO TABS: ORAL_TABLET | ORAL | 0 refills | Status: DC

## 2022-03-05 NOTE — Telephone Encounter (Signed)
Pt has MRI today at 1:00 pm. Pharmacy has not received medication to help pt during the MRI.  Pt requesting ALPRAZolam [name]) 1 MG tablet sent to [facility]

## 2022-03-05 NOTE — Telephone Encounter (Signed)
Meds ordered this encounter  Medications   ALPRAZolam (XANAX) 0.5 MG tablet    Sig: Take 1-2 tablets 30 minutes prior to MRI, may repeat once as needed. Must have driver.    Dispense:  3 tablet    Refill:  0     Please let patient know, I have E Rx

## 2022-03-08 ENCOUNTER — Other Ambulatory Visit: Payer: Self-pay | Admitting: Physician Assistant

## 2022-03-08 ENCOUNTER — Other Ambulatory Visit: Payer: Self-pay | Admitting: Family Medicine

## 2022-03-08 ENCOUNTER — Other Ambulatory Visit: Payer: Self-pay | Admitting: Neurology

## 2022-03-08 DIAGNOSIS — R112 Nausea with vomiting, unspecified: Secondary | ICD-10-CM

## 2022-03-08 DIAGNOSIS — H8113 Benign paroxysmal vertigo, bilateral: Secondary | ICD-10-CM

## 2022-03-08 DIAGNOSIS — R6889 Other general symptoms and signs: Secondary | ICD-10-CM

## 2022-03-08 MED ORDER — CETIRIZINE HCL 10 MG PO TABS: 10.0000 mg | ORAL_TABLET | Freq: Every day | ORAL | 0 refills | Status: DC

## 2022-03-08 MED ORDER — MECLIZINE HCL 25 MG PO TABS: 25.0000 mg | ORAL_TABLET | Freq: Two times a day (BID) | ORAL | 0 refills | Status: DC | PRN

## 2022-03-08 MED ORDER — PROMETHAZINE HCL 25 MG PO TABS: ORAL_TABLET | ORAL | 0 refills | Status: DC

## 2022-03-09 ENCOUNTER — Other Ambulatory Visit: Payer: Self-pay | Admitting: Family Medicine

## 2022-03-19 ENCOUNTER — Other Ambulatory Visit: Payer: Self-pay | Admitting: Family Medicine

## 2022-03-19 DIAGNOSIS — K580 Irritable bowel syndrome with diarrhea: Secondary | ICD-10-CM

## 2022-03-19 NOTE — Telephone Encounter (Signed)
Requested medication (s) are due for refill today:   Provider to review  Requested medication (s) are on the active medication list:   Yes  Future visit scheduled:   No   Last ordered: 02/08/2022 #30, 1 refill  Non delegated refill reason returned   Requested Prescriptions  Pending Prescriptions Disp Refills   diphenoxylate-atropine (LOMOTIL) 2.5-0.025 MG tablet [Pharmacy Med Name: diphenoxylate-atropine 2.5 mg-0.025 mg tablet] 30 tablet 1    Sig: TAKE ONE TABLET BY MOUTH TWICE DAILY     Not Delegated - Gastroenterology:  Antidiarrheals Failed - 03/19/2022  2:46 PM      Failed - This refill cannot be delegated      Passed - Valid encounter within last 12 months    Recent Outpatient Visits           3 months ago Dizziness   [place], Charlane Ferretti, [place]   3 months ago Type 2 diabetes mellitus with other neurologic complication, with long-term current use of insulin [facility])   [place], [place], [place]   6 months ago Type 2 diabetes mellitus with other neurologic complication, with long-term current use of insulin ([place])   [place], [place], [place]   9 months ago Type 2 diabetes mellitus with other neurologic complication, with long-term current use of insulin ([place])   [facility] [place]ott Rakes, [place]   1 year ago Annual physical exam   [facility] Charlott Rakes, [place]

## 2022-04-08 ENCOUNTER — Ambulatory Visit: Payer: Medicaid Other | Admitting: Neurology

## 2022-04-09 ENCOUNTER — Ambulatory Visit (INDEPENDENT_AMBULATORY_CARE_PROVIDER_SITE_OTHER): Payer: Medicaid Other | Admitting: Internal Medicine

## 2022-04-09 ENCOUNTER — Encounter: Payer: Self-pay | Admitting: Internal Medicine

## 2022-04-09 VITALS — BP 130/84 | HR 96 | Ht 67.0 in | Wt 146.0 lb

## 2022-04-09 DIAGNOSIS — E1165 Type 2 diabetes mellitus with hyperglycemia: Secondary | ICD-10-CM | POA: Diagnosis not present

## 2022-04-09 DIAGNOSIS — E1149 Type 2 diabetes mellitus with other diabetic neurological complication: Secondary | ICD-10-CM | POA: Diagnosis not present

## 2022-04-09 DIAGNOSIS — L600 Ingrowing nail: Secondary | ICD-10-CM | POA: Diagnosis not present

## 2022-04-09 DIAGNOSIS — E1142 Type 2 diabetes mellitus with diabetic polyneuropathy: Secondary | ICD-10-CM

## 2022-04-09 DIAGNOSIS — Z794 Long term (current) use of insulin: Secondary | ICD-10-CM | POA: Diagnosis not present

## 2022-04-09 LAB — POCT GLYCOSYLATED HEMOGLOBIN (HGB A1C): Hemoglobin A1C: 8.3 % — AB (ref 4.0–5.6)

## 2022-04-09 MED ORDER — OMNIPOD 5 DEXG7G6 PODS GEN 5 MISC: 1.0000 | 3 refills | Status: DC

## 2022-04-09 MED ORDER — LANTUS SOLOSTAR 100 UNIT/ML ~~LOC~~ SOPN: 24.0000 [IU] | PEN_INJECTOR | Freq: Every day | SUBCUTANEOUS | 2 refills | Status: DC

## 2022-04-09 MED ORDER — NOVOLOG FLEXPEN 100 UNIT/ML ~~LOC~~ SOPN: PEN_INJECTOR | SUBCUTANEOUS | 2 refills | Status: DC

## 2022-04-09 MED ORDER — OMNIPOD 5 DEXG7G6 INTRO GEN 5 KIT: 1.0000 | PACK | 0 refills | Status: DC

## 2022-04-09 MED ORDER — INSULIN PEN NEEDLE 32G X 4 MM MISC: 1.0000 | Freq: Four times a day (QID) | 2 refills | Status: DC

## 2022-04-09 NOTE — Progress Notes (Signed)
Name: Galileah Piggee  MRN/ DOB: [id number], 02/14/1961   Age/ Sex: 61 y.o., female    PCP: Charlott Rakes, MD   Reason for Endocrinology Evaluation: Type 2 Diabetes Mellitus     Date of Initial Endocrinology Visit: 12/19/2020    PATIENT IDENTIFIER: Ms. Ashaunte Standley is a 61 y.o. female with a past medical history of T2DM, HTN, COPD  , retinitis pigmentosa ,and CHF. The patient presented for initial endocrinology clinic visit on 12/19/2020 for consultative assistance with her diabetes management.    HPI: Ms. Cathers was    Diagnosed with DM 2017 Prior Medications tried/Intolerance: Metformin - diarrhea, Trulicity - nausea                  Hemoglobin A1c has ranged from 9.0% in 2018, peaking at 13.4% in 2022.  On her initial visit to our clinic her A1c was 11.9% , she was on insulin mix and Glimepiride , which we stopped and started her on Trulicity and Lantus    SUBJECTIVE:   During the last visit (08/10/2021): A1c 13.9 %    Today (04/09/22): Ms. Wohlers is here for a follow up on diabetes management. She is accompanied by her daughter today . She checks her blood sugars 4-5  times daily through CGM. The patient has not had hypoglycemic episodes since the last clinic visit, occur sporadically at night    She denies nausea but has noted loose stools She recently had another COVID infection requiring prednisone intake I did prescribe OmniPod but she tells me it was stolen She has ingrown nails of the 2 great toes and have been bothersome to her  HOME DIABETES REGIMEN: Novolog 10 units TIDQAC Lantus 24 units daily CF: Novolog (BG -130/30)     Statin: yes ACE-I/ARB: Allergic to lisinopril - swelling  Prior Diabetic Education: no    CONTINUOUS GLUCOSE MONITORING RECORD INTERPRETATION    Dates of Recording: 9/30 - 04/09/2022  Sensor description: dexcom   Results statistics:   CGM use % of time 79  Average and SD 204/56  Time in range    36%  % Time Above  180 43  % Time above 250 20  % Time Below target <1     Glycemic patterns summary: Bg's optimal overnight, hyperglycemia noted postprandial   Hyperglycemic episodes postprandial  Hypoglycemic episodes and a over the past 2 weeks  Overnight periods: stable        DIABETIC COMPLICATIONS: Microvascular complications:  Neuropathy Denies: CKD,retinopathy  Last eye exam: 12/2020  Macrovascular complications:  CHF  Denies: CAD, PVD, CVA   PAST HISTORY: Past Medical History:  Past Medical History:  Diagnosis Date   Anxiety    Arthritis    Chronic systolic heart failure (HCC)    EF 20-25% ECHO 05/2015   COPD (chronic obstructive pulmonary disease) ([place])    Depression    Diabetes mellitus without complication ([place])    Type II   Dysrhythmia    pt unsure of name of arrythmia - " my heart rate will drop all of a sudden" -no current treatment - atrial flutter   Fibrillation, atrial (HCC)    Gallstones    GERD (gastroesophageal reflux disease)    Headache(784.0)    migraines   Hypertension    Osteomyelitis ([place])    R ankle   Rash    arms   Past Surgical History:  Past Surgical History:  Procedure Laterality Date   ANKLE FRACTURE SURGERY Right 2015  CARDIAC CATHETERIZATION N/A 06/24/2015   Procedure: Left Heart Cath and Coronary Angiography;  Surgeon: [name], MD;  Location[facility] INVASIVE CV LAB;  Service: Cardiovascular;  Laterality: N/A;   CARPAL TUNNEL RELEASE     bil   CHOLECYSTECTOMY  11/24/2011   Procedure: LAPAROSCOPIC CHOLECYSTECTOMY WITH INTRAOPERATIVE CHOLANGIOGRAM;  Surgeon: Clovis Pu. Cornett, MD;  Location: [facility] ORS;  Service: General;  Laterality: N/A;  Laparoscopic Cholecystectomy with Cholangiogram   COLONOSCOPY     ECTOPIC PREGNANCY SURGERY  many yrs ago   HARDWARE REMOVAL Right 07/15/2015   Procedure: RIGHT ANKLE HARDWARE REMOVAL, PLACEMENT OF STIMULAN ANTIBIOTIC BEADS AND PREVENA WOUND VAC.;  Surgeon: [name], MD;  Location: [facility] OR;   Service: Orthopedics;  Laterality: Right;   TOOTH EXTRACTION N/A 12/03/2016   Procedure: DENTAL EXTRACTIONS with Alveoloplasty;  Surgeon: Ocie Doyne, DDS;  [facility];  Service: Oral Surgery;  Laterality: N/A;    Social History:  reports that she has been smoking cigarettes and e-cigarettes. She started smoking about 5 years ago. She has a 20.00 pack-year smoking history. She has never used smokeless tobacco. She reports that she does not drink alcohol and does not use drugs. Family History:  Family History  Problem Relation Age of Onset   Diabetes Mother    Lung cancer Father    Heart attack Father 59       CABG x4   Breast cancer Sister        Survivor      HOME MEDICATIONS: Allergies as of 04/09/2022       Reactions   Biaxin [clarithromycin] Anaphylaxis, Swelling   Clarithromycin Shortness Of Breath, Swelling   Lisinopril Swelling, Cough   Lip edema   Sulfa Antibiotics Anaphylaxis, Shortness Of Breath, Swelling, Hypertension   Chlorthalidone Nausea And Vomiting, Other (See Comments)   Clammy, Tachycardia, Headache   Daptomycin Nausea And Vomiting   Latex Hives, Rash   Amoxicillin-pot Clavulanate Diarrhea   Has patient had a PCN reaction causing immediate rash, facial/tongue/throat swelling, SOB or lightheadedness with hypotension:No Has patient had a PCN reaction causing severe rash involving mucus membranes or skin necrosis:No Has patient had a PCN reaction that required hospitalization:No Has patient had a PCN reaction occurring within THE LAST 10 YEARS.  #  #  #  YES  #  #  #  If all of the above answers are "NO", then may proceed with Cephalosporin use.   Aspirin Nausea And Vomiting, Rash   On 325mg  dosage   Cholestyramine Nausea And Vomiting   Dilaudid [hydromorphone Hcl] Nausea And Vomiting   Lasix [furosemide] Nausea And Vomiting, Other (See Comments)   Headache         Medication List        Accurate as of April 09, 2022  2:21 PM. If you have any  questions, ask your nurse or doctor.          STOP taking these medications    gabapentin 300 MG capsule Commonly known as: NEURONTIN Stopped by: April 11, 2022, MD       TAKE these medications    Accu-Chek Guide Me w/Device Kit Use to check blood sugar TID.   Accu-Chek Guide test strip Generic drug: glucose blood 1 each by Other route in the morning, at noon, in the evening, and at bedtime. Use as instructed   accu-chek softclix lancets Use as instructed daily.   Accu-Chek Softclix Lancets lancets Use to check blood sugar THREE TIMES DAILY  albuterol 108 (90 Base) MCG/ACT inhaler Commonly known as: Ventolin HFA Inhale 2 puffs into the lungs every 4 (four) hours as needed for wheezing or shortness of breath.   ALPRAZolam 1 MG tablet Commonly known as: XANAX Take 1-2 tablets thirty minutes prior to MRI.  May take one additional tablet before entering scanner, if needed.  MUST HAVE DRIVER.   ALPRAZolam 0.5 MG tablet Commonly known as: XANAX Take 1-2 tablets 30 minutes prior to MRI, may repeat once as needed. Must have driver.   amitriptyline 25 MG tablet Commonly known as: ELAVIL Take 1 tablet by mouth at bedtime.   amitriptyline 25 MG tablet Commonly known as: ELAVIL Take 1 tablet (25 mg total) by mouth at bedtime.   cetirizine 10 MG tablet Commonly known as: ZYRTEC Take 1 tablet (10 mg total) by mouth daily.   Dexcom G6 Receiver Devi 1 Device by Does not apply route as directed.   Dexcom G6 Transmitter Misc USE AS DIRECTED   Dexcom G7 Sensor Misc 1 Device by Does not apply route as directed.   dicyclomine 10 MG capsule Commonly known as: BENTYL TAKE ONE CAPSULE BY MOUTH THREE TIMES DAILY BEFORE MEALS   diphenoxylate-atropine 2.5-0.025 MG tablet Commonly known as: LOMOTIL TAKE ONE TABLET BY MOUTH TWICE DAILY   doxycycline 100 MG tablet Commonly known as: ADOXA Take 1 tablet (100 mg total) by mouth 2 (two) times daily.   doxycycline 100  MG tablet Commonly known as: VIBRA-TABS Take 1 tablet (100 mg total) by mouth 2 (two) times daily.   ergocalciferol 1.25 MG (50000 UT) capsule Commonly known as: Drisdol Take 1 capsule (50,000 Units total) by mouth once a week. What changed:  how much to take when to take this   esomeprazole 20 MG capsule Commonly known as: NEXIUM Take 1 capsule (20 mg total) by mouth daily.   fenofibrate 145 MG tablet Commonly known as: TRICOR TAKE ONE TABLET BY MOUTH EVERY DAY   FLUoxetine 40 MG capsule Commonly known as: PROZAC Take 1 capsule (40 mg total) by mouth at bedtime.   hydrOXYzine 10 MG tablet Commonly known as: ATARAX Take 1 tablet (10 mg total) by mouth 3 (three) times daily as needed.   Insulin Pen Needle 32G X 4 MM Misc 1 Device by Does not apply route 4 (four) times daily.   Lantus SoloStar 100 UNIT/ML Solostar Pen Generic drug: insulin glargine Inject 24 Units into the skin daily.   meclizine 25 MG tablet Commonly known as: ANTIVERT Take 1 tablet (25 mg total) by mouth 2 (two) times daily as needed for dizziness.   metoprolol succinate 25 MG 24 hr tablet Commonly known as: TOPROL-XL Take 1 tablet (25 mg total) by mouth daily.   mupirocin ointment 2 % Commonly known as: BACTROBAN Apply 1 application. topically 2 (two) times daily.   NovoLOG FlexPen 100 UNIT/ML FlexPen Generic drug: insulin aspart Max Daily 50 units per correction scale   Omnipod 5 G6 Pod (Gen 5) Misc 1 Device by Does not apply route every 3 (three) days.   Omnipod 5 G6 Intro (Gen 5) Kit 1 Device by Does not apply route every 3 (three) days.   oxyCODONE-acetaminophen 5-325 MG tablet Commonly known as: Percocet Take 1 tablet by mouth every 4 (four) hours as needed for severe pain or moderate pain.   pregabalin 150 MG capsule Commonly known as: LYRICA Take 1 capsule (150 mg total) by mouth 2 (two) times daily. What changed: Another medication with the same name  was removed. Continue taking  this medication, and follow the directions you see here. Changed by: Dorita Sciara, MD   promethazine 25 MG tablet Commonly known as: PHENERGAN 1/2-1 tab every 4 to 6 hours prn nausea   rosuvastatin 10 MG tablet Commonly known as: Crestor Take 1 tablet (10 mg total) by mouth daily.         ALLERGIES: Allergies  Allergen Reactions   Biaxin [Clarithromycin] Anaphylaxis and Swelling   Clarithromycin Shortness Of Breath and Swelling   Lisinopril Swelling and Cough    Lip edema   Sulfa Antibiotics Anaphylaxis, Shortness Of Breath, Swelling and Hypertension   Chlorthalidone Nausea And Vomiting and Other (See Comments)    Clammy, Tachycardia, Headache    Daptomycin Nausea And Vomiting   Latex Hives and Rash   Amoxicillin-Pot Clavulanate Diarrhea    Has patient had a PCN reaction causing immediate rash, facial/tongue/throat swelling, SOB or lightheadedness with hypotension:No Has patient had a PCN reaction causing severe rash involving mucus membranes or skin necrosis:No Has patient had a PCN reaction that required hospitalization:No Has patient had a PCN reaction occurring within THE LAST 10 YEARS.  #  #  #  YES  #  #  #  If all of the above answers are "NO", then may proceed with Cephalosporin use.    Aspirin Nausea And Vomiting and Rash    On $R'325mg'nw$  dosage   Cholestyramine Nausea And Vomiting   Dilaudid [Hydromorphone Hcl] Nausea And Vomiting   Lasix [Furosemide] Nausea And Vomiting and Other (See Comments)    Headache        OBJECTIVE:   VITAL SIGNS: BP 130/84 (BP Location: Right Arm, Patient Position: Sitting, Cuff Size: Small)   Pulse 96   Ht $R'5\' 7"'CE$  (1.702 m)   Wt 146 lb (66.2 kg)   LMP 06/09/2011 Comment: verified BEFORE imaging  SpO2 95%   BMI 22.87 kg/m    PHYSICAL EXAM:  General: Pt appears well and is in NAD  Lungs: Scattered rhonchi  Heart: RRR  Abdomen: soft, nontender  Extremities:  Lower extremities - No pretibial edema.  Neuro: MS is good  with appropriate affect, pt is alert and Ox3   DM foot exam 04/09/2022  The skin of the feet is intact without sores or ulcerations. The pedal pulses are 2+ on right and 2+ on left. The sensation is intact to a screening 5.07, 10 gram monofilament bilaterally   DATA REVIEWED:  Lab Results  Component Value Date   HGBA1C 8.3 (A) 04/09/2022   HGBA1C 9.2 (A) 10/08/2021   HGBA1C 13.9 (A) 07/29/2021   Lab Results  Component Value Date   LDLCALC 39 06/11/2021   CREATININE 0.61 11/25/2021   Lab Results  Component Value Date   MICRALBCREAT 21 03/09/2021    Lab Results  Component Value Date   CHOL 114 06/11/2021   HDL 28 (L) 06/11/2021   LDLCALC 39 06/11/2021   TRIG 316 (H) 06/11/2021   CHOLHDL 5.6 (H) 07/31/2019        ASSESSMENT / PLAN / RECOMMENDATIONS:   1) Type 2 Diabetes Mellitus, Poorly controlled, With neuropathic  complications - Most recent A1c of 8.3 %. Goal A1c < 7.0 %.    -I have praised the patient on continued glycemic improvement, A1c down from 9.2% to 8.3% -Intolerant to Trulicity 2.95 mg weekly -I prescribed OmniPod for her, but the patient tells me this was stolen, I will refill this again, I will refer her to our CDE  for training -She was also provided with a pamphlet to train her pump prior to training -In review on her CGM data, patient has been noted with postprandial hyperglycemia, will increase NovoLog as below  MEDICATIONS: Continue Lantus 24 units once daily  Increase NovoLog  12 units 3 times daily before every meal Continue correction factor: NovoLog (BG -130/30)   EDUCATION / INSTRUCTIONS: BG monitoring instructions: Patient is instructed to check her blood sugars 1 times a day, fasting. Call [facility] if: BG persistently < 70  I reviewed the Rule of 15 for the treatment of hypoglycemia in detail with the patient. Literature supplied.   2) Diabetic complications:  Eye: Does not have known diabetic retinopathy.  Neuro/  Feet: Does  have known diabetic peripheral neuropathy. Renal: Patient does not have known baseline CKD. She is not on an ACEI/ARB at present.   4) ingrown Toe nail   -Referred to podiatry for further management  F/U in 4 months    Signed electronically by: Mack Guise, MD  [facility] End[facility]inology  [address] [place], [place] [address] Phone: [phone] FAX: [phone]   CC: Charlott Rakes, MD [place] [address] Phone: [phone]  Fax: [phone]    Return to Endocrinology clinic as below: Future Appointments  Date Time Provider [place]  04/20/2022 11:15 AM Alric Ran, MD [facility] None  10/11/2022  1:40 PM Cumi San[place], Melanie Crazier, MD [facility] None

## 2022-04-12 ENCOUNTER — Encounter: Payer: Self-pay | Admitting: Internal Medicine

## 2022-04-15 ENCOUNTER — Ambulatory Visit: Payer: Medicaid Other | Admitting: Podiatry

## 2022-04-16 ENCOUNTER — Telehealth: Payer: Self-pay | Admitting: Neurology

## 2022-04-16 ENCOUNTER — Encounter: Payer: Self-pay | Admitting: Neurology

## 2022-04-16 ENCOUNTER — Other Ambulatory Visit: Payer: Self-pay | Admitting: Neurology

## 2022-04-16 MED ORDER — AMITRIPTYLINE HCL 25 MG PO TABS: 25.0000 mg | ORAL_TABLET | Freq: Every day | ORAL | 1 refills | Status: DC

## 2022-04-16 NOTE — Telephone Encounter (Signed)
Pt states since last night she is out of her amitriptyline (ELAVIL) 25 MG tablet([facility] at [address]) pt does not want to go the entire weekend without.  Pt has asked the request be sent to the On Call Dr

## 2022-04-20 ENCOUNTER — Ambulatory Visit: Payer: Medicaid Other | Admitting: Neurology

## 2022-04-20 ENCOUNTER — Encounter: Payer: Self-pay | Admitting: Neurology

## 2022-04-20 VITALS — Ht 67.0 in | Wt 149.0 lb

## 2022-04-20 DIAGNOSIS — R519 Headache, unspecified: Secondary | ICD-10-CM

## 2022-04-20 DIAGNOSIS — M792 Neuralgia and neuritis, unspecified: Secondary | ICD-10-CM | POA: Diagnosis not present

## 2022-04-20 MED ORDER — PREGABALIN 75 MG PO CAPS: ORAL_CAPSULE | ORAL | 3 refills | Status: DC

## 2022-04-20 MED ORDER — AMITRIPTYLINE HCL 25 MG PO TABS: 25.0000 mg | ORAL_TABLET | Freq: Every day | ORAL | 3 refills | Status: DC

## 2022-04-20 NOTE — Progress Notes (Signed)
[facility]  PATIENT: [name] DOB: 1960-08-06  REQUESTING CLINICIAN: Charlott Rakes, MD HISTORY FROM: Patient  REASON FOR VISIT: Headaches/Dizziness/Neuropathic pain    HISTORICAL  CHIEF COMPLAINT:  Chief Complaint  Patient presents with   Follow-up    Rm 13 States she is doing well on lyrica, dizzy spells have decreased some  Take $Rem'75mg'kfRz$  of Lyrica in am and 150 mg at bedtime     INTERVAL HISTORY 04/20/22: Patient presents today for follow-up, last visit was in July, at that time we started her on amitriptyline for headaches preventive medication and also Lyrica for the neuropathy.  Patient reports the combination of amitriptyline and Lyrica is very beneficial, her migraines are well controlled and her neuropathy symptoms are also well controlled with the Lyrica 75/150. She also reports that her dizziness improved.  She does report increased stress, that her sister is in admitted in the ICU and then she is taking care of her and also her niece (sister son) who was diagnosed with cerebral palsy.   HISTORY OF PRESENT ILLNESS:  This is a 61 year old woman past medical history of COPD, diabetes mellitus, neuropathy, depression, hypertension and hyperlipidemia who is presenting with complaint of dizziness, that she described as room spinning sensation, can last 3 seconds up to a minute.  During this time, she feels really scared, and has nausea.  On top of that she also complained of headaches, reported daily headache, sometimes she can wake up with headaches.  Described headache as aching pain.  In the past, she had tried Amitriptyline with good results with the headaches.  Patient also complain of neuropathic pain mostly on the left side.  She did have a shingles outbreak on the left side of her back and now in the same region she has itchiness, numbness and stabbing pain.  She reported her neuropathic pain got worse since starting her insulin and controlling  her diabetes.  Currently her A1c went from 13 to almost 7.     OTHER MEDICAL CONDITIONS: COPD PAD, hypertension, hyperlipidemia, diabetes mellitus, depression and neuropathy, shingles   REVIEW OF SYSTEMS: Full 14 system review of systems performed and negative with exception of: as noted in the HPI   ALLERGIES: Allergies  Allergen Reactions   Biaxin [Clarithromycin] Anaphylaxis and Swelling   Clarithromycin Shortness Of Breath and Swelling   Lisinopril Swelling and Cough    Lip edema   Sulfa Antibiotics Anaphylaxis, Shortness Of Breath, Swelling and Hypertension   Chlorthalidone Nausea And Vomiting and Other (See Comments)    Clammy, Tachycardia, Headache    Daptomycin Nausea And Vomiting   Latex Hives and Rash   Amoxicillin-Pot Clavulanate Diarrhea    Has patient had a PCN reaction causing immediate rash, facial/tongue/throat swelling, SOB or lightheadedness with hypotension:No Has patient had a PCN reaction causing severe rash involving mucus membranes or skin necrosis:No Has patient had a PCN reaction that required hospitalization:No Has patient had a PCN reaction occurring within THE LAST 10 YEARS.  #  #  #  YES  #  #  #  If all of the above answers are "NO", then may proceed with Cephalosporin use.    Aspirin Nausea And Vomiting and Rash    On $R'325mg'uC$  dosage   Cholestyramine Nausea And Vomiting   Dilaudid [Hydromorphone Hcl] Nausea And Vomiting   Lasix [Furosemide] Nausea And Vomiting and Other (See Comments)    Headache     HOME MEDICATIONS: Outpatient Medications Prior to Visit  Medication Sig  Dispense Refill   Accu-Chek Softclix Lancets lancets Use to check blood sugar THREE TIMES DAILY 100 each 2   albuterol (VENTOLIN HFA) 108 (90 Base) MCG/ACT inhaler Inhale 2 puffs into the lungs every 4 (four) hours as needed for wheezing or shortness of breath. 18 g 2   Blood Glucose Monitoring Suppl (ACCU-CHEK GUIDE ME) w/Device KIT Use to check blood sugar TID. 1 kit 0    cetirizine (ZYRTEC) 10 MG tablet Take 1 tablet (10 mg total) by mouth daily. 30 tablet 0   Continuous Blood Gluc Sensor (DEXCOM G7 SENSOR) MISC 1 Device by Does not apply route as directed. 9 each 4   dicyclomine (BENTYL) 10 MG capsule TAKE ONE CAPSULE BY MOUTH THREE TIMES DAILY BEFORE MEALS 270 capsule 1   diphenoxylate-atropine (LOMOTIL) 2.5-0.025 MG tablet TAKE ONE TABLET BY MOUTH TWICE DAILY 30 tablet 1   ergocalciferol (DRISDOL) 1.25 MG (50000 UT) capsule Take 1 capsule (50,000 Units total) by mouth once a week. (Patient taking differently: Take 1,000 Units by mouth daily.) 12 capsule 1   esomeprazole (NEXIUM) 20 MG capsule Take 1 capsule (20 mg total) by mouth daily. 90 capsule 1   fenofibrate (TRICOR) 145 MG tablet TAKE ONE TABLET BY MOUTH EVERY DAY 90 tablet 0   FLUoxetine (PROZAC) 40 MG capsule Take 1 capsule (40 mg total) by mouth at bedtime. 90 capsule 1   glucose blood (ACCU-CHEK GUIDE) test strip 1 each by Other route in the morning, at noon, in the evening, and at bedtime. Use as instructed 400 each 3   hydrOXYzine (ATARAX) 10 MG tablet Take 1 tablet (10 mg total) by mouth 3 (three) times daily as needed. 60 tablet 2   insulin aspart (NOVOLOG FLEXPEN) 100 UNIT/ML FlexPen Max Daily 50 units per correction scale 45 mL 2   insulin glargine (LANTUS SOLOSTAR) 100 UNIT/ML Solostar Pen Inject 24 Units into the skin daily. 30 mL 2   Insulin Pen Needle 32G X 4 MM MISC 1 Device by Does not apply route 4 (four) times daily. 400 each 2   Lancet Devices (ACCU-CHEK SOFTCLIX) lancets Use as instructed daily. 1 each 5   meclizine (ANTIVERT) 25 MG tablet Take 1 tablet (25 mg total) by mouth 2 (two) times daily as needed for dizziness. 90 tablet 0   metoprolol succinate (TOPROL-XL) 25 MG 24 hr tablet Take 1 tablet (25 mg total) by mouth daily. 90 tablet 0   mupirocin ointment (BACTROBAN) 2 % Apply 1 application. topically 2 (two) times daily. 22 g 1   promethazine (PHENERGAN) 25 MG tablet 1/2-1 tab  every 4 to 6 hours prn nausea 20 tablet 0   rosuvastatin (CRESTOR) 10 MG tablet Take 1 tablet (10 mg total) by mouth daily. 90 tablet 1   amitriptyline (ELAVIL) 25 MG tablet Take 1 tablet (25 mg total) by mouth at bedtime. 90 tablet 1   pregabalin (LYRICA) 150 MG capsule Take 1 capsule (150 mg total) by mouth 2 (two) times daily. (Patient taking differently: Take 150 mg by mouth 2 (two) times daily. 75 mg in am, 150 mg in pm) 60 capsule 11   ALPRAZolam (XANAX) 0.5 MG tablet Take 1-2 tablets 30 minutes prior to MRI, may repeat once as needed. Must have driver. (Patient not taking: Reported on 04/09/2022) 3 tablet 0   ALPRAZolam (XANAX) 1 MG tablet Take 1-2 tablets thirty minutes prior to MRI.  May take one additional tablet before entering scanner, if needed.  MUST HAVE DRIVER. (Patient not taking:  Reported on 04/09/2022) 3 tablet 0   amitriptyline (ELAVIL) 25 MG tablet Take 1 tablet by mouth at bedtime.     Continuous Blood Gluc Transmit (DEXCOM G6 TRANSMITTER) MISC USE AS DIRECTED 1 each 6   doxycycline (ADOXA) 100 MG tablet Take 1 tablet (100 mg total) by mouth 2 (two) times daily. 10 tablet 0   doxycycline (VIBRA-TABS) 100 MG tablet Take 1 tablet (100 mg total) by mouth 2 (two) times daily. (Patient not taking: Reported on 04/09/2022) 20 tablet 0   Insulin Disposable Pump (OMNIPOD 5 G6 INTRO, GEN 5,) KIT 1 Device by Does not apply route every 3 (three) days. 1 kit 0   Insulin Disposable Pump (OMNIPOD 5 G6 POD, GEN 5,) MISC 1 Device by Does not apply route every 3 (three) days. 6 each 3   oxyCODONE-acetaminophen (PERCOCET) 5-325 MG tablet Take 1 tablet by mouth every 4 (four) hours as needed for severe pain or moderate pain. 12 tablet 0   Facility-Administered Medications Prior to Visit  Medication Dose Route Frequency Provider Last Rate Last Admin   regadenoson (LEXISCAN) injection SOLN 0.4 mg  0.4 mg Intravenous Once Lars Masson, MD       technetium tetrofosmin (TC-MYOVIEW) injection 32.8  millicurie  32.8 millicurie Intravenous Once PRN Lars Masson, MD        PAST MEDICAL HISTORY: Past Medical History:  Diagnosis Date   Anxiety    Arthritis    Chronic systolic heart failure (HCC)    EF 20-25% ECHO 05/2015   COPD (chronic obstructive pulmonary disease) (HCC)    Depression    Diabetes mellitus without complication (HCC)    Type II   Dysrhythmia    pt unsure of name of arrythmia - " my heart rate will drop all of a sudden" -no current treatment - atrial flutter   Fibrillation, atrial (HCC)    Gallstones    GERD (gastroesophageal reflux disease)    Headache(784.0)    migraines   Hypertension    Osteomyelitis (HCC)    R ankle   Rash    arms    PAST SURGICAL HISTORY: Past Surgical History:  Procedure Laterality Date   ANKLE FRACTURE SURGERY Right 2015   CARDIAC CATHETERIZATION N/A 06/24/2015   Procedure: Left Heart Cath and Coronary Angiography;  Surgeon: [name], MD;  Location: [facility] INVASIVE CV LAB;  Service: Cardiovascular;  Laterality: N/A;   CARPAL TUNNEL RELEASE     bil   CHOLECYSTECTOMY  11/24/2011   Procedure: LAPAROSCOPIC CHOLECYSTECTOMY WITH INTRAOPERATIVE CHOLANGIOGRAM;  Surgeon: Clovis Pu. Cornett, MD;  Location: [facility] ORS;  Service: General;  Laterality: N/A;  Laparoscopic Cholecystectomy with Cholangiogram   COLONOSCOPY     ECTOPIC PREGNANCY SURGERY  many yrs ago   HARDWARE REMOVAL Right 07/15/2015   Procedure: RIGHT ANKLE HARDWARE REMOVAL, PLACEMENT OF STIMULAN ANTIBIOTIC BEADS AND PREVENA WOUND VAC.;  Surgeon: [name], MD;  Location: [facility] OR;  Service: Orthopedics;  Laterality: Right;   TOOTH EXTRACTION N/A 12/03/2016   Procedure: DENTAL EXTRACTIONS with Alveoloplasty;  Surgeon: Ocie Doyne, DDS;  Location: [facility] OR;  Service: Oral Surgery;  Laterality: N/A;    FAMILY HISTORY: Family History  Problem Relation Age of Onset   Diabetes Mother    Lung cancer Father    Heart attack Father 47       CABG x4   Breast cancer Sister         Survivor     SOCIAL HISTORY: Social History  Socioeconomic History   Marital status: Divorced    Spouse name: Not on file   Number of children: Not on file   Years of education: Not on file   Highest education level: Not on file  Occupational History   Not on file  Tobacco Use   Smoking status: Every Day    Packs/day: 1.00    Years: 20.00    Total pack years: 20.00    Types: Cigarettes, E-cigarettes    Start date: 09/19/2016   Smokeless tobacco: Never  Vaping Use   Vaping Use: Every day   Start date: 10/12/2016  Substance and Sexual Activity   Alcohol use: No    Alcohol/week: 0.0 standard drinks of alcohol   Drug use: No   Sexual activity: Not Currently    Birth control/protection: Post-menopausal  Other Topics Concern   Not on file  Social History Narrative   Not on file   Social Determinants of Health   Financial Resource Strain: Not on file  Food Insecurity: Not on file  Transportation Needs: Not on file  Physical Activity: Not on file  Stress: Not on file  Social Connections: Not on file  Intimate Partner Violence: Not on file    PHYSICAL EXAM  GENERAL EXAM/CONSTITUTIONAL: Vitals:  Vitals:   04/20/22 1108  Weight: 149 lb (67.6 kg)  Height: $Remove'5\' 7"'HNlRGoL$  (1.702 m)   Body mass index is 23.34 kg/m. Wt Readings from Last 3 Encounters:  04/20/22 149 lb (67.6 kg)  04/09/22 146 lb (66.2 kg)  01/04/22 138 lb (62.6 kg)   Patient is in no distress; well developed, nourished and groomed; neck is supple  EYES: Pupils round and reactive to light, Visual fields full to confrontation, Extraocular movements intacts,   MUSCULOSKELETAL: Gait, strength, tone, movements noted in Neurologic exam below  NEUROLOGIC: MENTAL STATUS:      No data to display         awake, alert, oriented to person, place and time recent and remote memory intact normal attention and concentration language fluent, comprehension intact, naming intact fund of knowledge  appropriate  CRANIAL NERVE:  2nd, 3rd, 4th, 6th - pupils equal and reactive to light, visual fields full to confrontation, extraocular muscles intact, no nystagmus 5th - facial sensation symmetric 7th - facial strength symmetric 8th - hearing intact 9th - palate elevates symmetrically, uvula midline 11th - shoulder shrug symmetric 12th - tongue protrusion midline  MOTOR:  normal bulk and tone, full strength in the BUE, BLE  SENSORY:  normal and symmetric to light touch, pinprick, vibration  COORDINATION:  finger-nose-finger, fine finger movements normal  REFLEXES:  deep tendon reflexes present and symmetric  GAIT/STATION:  normal  DIAGNOSTIC DATA (LABS, IMAGING, TESTING) - I reviewed patient records, labs, notes, testing and imaging myself where available.  Lab Results  Component Value Date   WBC 9.1 11/25/2021   HGB 14.9 11/25/2021   HCT 45.1 11/25/2021   MCV 88 11/25/2021   PLT 190 11/25/2021      Component Value Date/Time   NA 140 11/25/2021 1645   K 4.0 11/25/2021 1645   CL 102 11/25/2021 1645   CO2 21 11/25/2021 1645   GLUCOSE 151 (H) 11/25/2021 1645   GLUCOSE 174 (H) 11/05/2021 1547   BUN 9 11/25/2021 1645   CREATININE 0.61 11/25/2021 1645   CREATININE 0.59 07/05/2016 0947   CALCIUM 9.5 11/25/2021 1645   PROT 7.2 11/25/2021 1645   ALBUMIN 4.5 11/25/2021 1645   AST 11 11/25/2021 1645  ALT 6 11/25/2021 1645   ALKPHOS 71 11/25/2021 1645   BILITOT <0.2 11/25/2021 1645   GFRNONAA >60 11/05/2021 1547   GFRNONAA >89 07/05/2016 0947   GFRAA 114 07/11/2020 1002   GFRAA >89 07/05/2016 0947   Lab Results  Component Value Date   CHOL 114 06/11/2021   HDL 28 (L) 06/11/2021   LDLCALC 39 06/11/2021   TRIG 316 (H) 06/11/2021   CHOLHDL 5.6 (H) 07/31/2019   Lab Results  Component Value Date   HGBA1C 8.3 (A) 04/09/2022   No results found for: "VITAMINB12" Lab Results  Component Value Date   TSH 0.997 03/09/2021     ASSESSMENT AND PLAN  61 y.o. year  old female with medical conditions including hypertension, hyperlipidemia, diabetes mellitus, COPD and postherpetic neuralgia who is presenting for follow up for her headaches and neuropathy.  She reports that the combination of amitriptyline and Lyrica is very helpful for her headaches and neuropathy symptoms.  Currently she is doing well except there is increased stress related to her sister being sick, in the ICU, on the ventilator.  At the moment we will continue patient with current medications, I have also advised her to contact me if her symptoms get worse. Follow up in a year.     1. Nonintractable headache, unspecified chronicity pattern, unspecified headache type   2. Neuropathic pain      Patient Instructions  Continue with amitriptyline 25 mg nightly Continue with Lyrica 75/150 Continue your other medications Follow-up in 1 year or sooner if worse  No orders of the defined types were placed in this encounter.   Meds ordered this encounter  Medications   pregabalin (LYRICA) 75 MG capsule    Sig: Take 1 capsule (75 mg total) by mouth daily AND 2 capsules (150 mg total) at bedtime. 75 mg in am, 150 mg in pm.    Dispense:  270 capsule    Refill:  3   amitriptyline (ELAVIL) 25 MG tablet    Sig: Take 1 tablet (25 mg total) by mouth at bedtime.    Dispense:  90 tablet    Refill:  3    Return in about 1 year (around 04/21/2023).    Alric Ran, MD 04/20/2022, 2:48 PM  [facility] [address], [place], [place] [address] [phone]

## 2022-04-20 NOTE — Patient Instructions (Signed)
Continue with amitriptyline 25 mg nightly Continue with Lyrica 75/150 Continue your other medications Follow-up in 1 year or sooner if worse

## 2022-04-23 ENCOUNTER — Ambulatory Visit (INDEPENDENT_AMBULATORY_CARE_PROVIDER_SITE_OTHER): Payer: Medicaid Other | Admitting: Podiatry

## 2022-04-23 ENCOUNTER — Ambulatory Visit (INDEPENDENT_AMBULATORY_CARE_PROVIDER_SITE_OTHER): Payer: Medicaid Other

## 2022-04-23 ENCOUNTER — Encounter: Payer: Self-pay | Admitting: Podiatry

## 2022-04-23 DIAGNOSIS — F17200 Nicotine dependence, unspecified, uncomplicated: Secondary | ICD-10-CM | POA: Insufficient documentation

## 2022-04-23 DIAGNOSIS — M2011 Hallux valgus (acquired), right foot: Secondary | ICD-10-CM

## 2022-04-23 DIAGNOSIS — Z79899 Other long term (current) drug therapy: Secondary | ICD-10-CM

## 2022-04-23 DIAGNOSIS — E782 Mixed hyperlipidemia: Secondary | ICD-10-CM | POA: Insufficient documentation

## 2022-04-23 DIAGNOSIS — R197 Diarrhea, unspecified: Secondary | ICD-10-CM | POA: Insufficient documentation

## 2022-04-23 DIAGNOSIS — M79675 Pain in left toe(s): Secondary | ICD-10-CM | POA: Diagnosis not present

## 2022-04-23 DIAGNOSIS — H53009 Unspecified amblyopia, unspecified eye: Secondary | ICD-10-CM | POA: Insufficient documentation

## 2022-04-23 DIAGNOSIS — E785 Hyperlipidemia, unspecified: Secondary | ICD-10-CM | POA: Insufficient documentation

## 2022-04-23 DIAGNOSIS — M79674 Pain in right toe(s): Secondary | ICD-10-CM | POA: Diagnosis not present

## 2022-04-23 DIAGNOSIS — J309 Allergic rhinitis, unspecified: Secondary | ICD-10-CM | POA: Insufficient documentation

## 2022-04-23 DIAGNOSIS — M2012 Hallux valgus (acquired), left foot: Secondary | ICD-10-CM

## 2022-04-23 DIAGNOSIS — M1990 Unspecified osteoarthritis, unspecified site: Secondary | ICD-10-CM | POA: Insufficient documentation

## 2022-04-23 DIAGNOSIS — B351 Tinea unguium: Secondary | ICD-10-CM | POA: Diagnosis not present

## 2022-04-23 DIAGNOSIS — E11319 Type 2 diabetes mellitus with unspecified diabetic retinopathy without macular edema: Secondary | ICD-10-CM | POA: Insufficient documentation

## 2022-04-23 NOTE — Progress Notes (Unsigned)
Subjective:   Patient ID: [name], female   DOB: 61 y.o.   MRN: [id number]   HPI Chief Complaint  Patient presents with   Toe Pain    Hallux bilateral - lateral borders, ingrown toenails x years, diabetic-last a1c was 8.1, tried trimming out herself, tender   New Patient (Initial Visit)   61 year old female with above concerns.  She has no swelling redness to the toenail sites but do cause discomfort.  The nails are also thickened.  She has secondary concerns going on the area of bunion where she gets discomfort as well.  No recent injuries.   Review of Systems  All other systems reviewed and are negative.  Past Medical History:  Diagnosis Date   Anxiety    Arthritis    Chronic systolic heart failure (HCC)    EF 20-25% ECHO 05/2015   COPD (chronic obstructive pulmonary disease) (HCC)    Depression    Diabetes mellitus without complication (HCC)    Type II   Dysrhythmia    pt unsure of name of arrythmia - " my heart rate will drop all of a sudden" -no current treatment - atrial flutter   Fibrillation, atrial (HCC)    Gallstones    GERD (gastroesophageal reflux disease)    Headache(784.0)    migraines   Hypertension    Osteomyelitis (HCC)    R ankle   Rash    arms    Past Surgical History:  Procedure Laterality Date   ANKLE FRACTURE SURGERY Right 2015   CARDIAC CATHETERIZATION N/A 06/24/2015   Procedure: Left Heart Cath and Coronary Angiography;  Surgeon: Belva Crome, MD;  Location: [place] CV LAB;  Service: Cardiovascular;  Laterality: N/A;   CARPAL TUNNEL RELEASE     bil   CHOLECYSTECTOMY  11/24/2011   Procedure: LAPAROSCOPIC CHOLECYSTECTOMY WITH INTRAOPERATIVE CHOLANGIOGRAM;  Surgeon: Joyice Faster. Cornett, MD;  Location: [facility] ORS;  Service: General;  Laterality: N/A;  Laparoscopic Cholecystectomy with Cholangiogram   COLONOSCOPY     ECTOPIC PREGNANCY SURGERY  many yrs ago   HARDWARE REMOVAL Right 07/15/2015   Procedure: RIGHT ANKLE HARDWARE REMOVAL,  PLACEMENT OF STIMULAN ANTIBIOTIC BEADS AND PREVENA WOUND VAC.;  Surgeon: Meredith Pel, MD;  Location: [place];  Service: Orthopedics;  Laterality: Right;   TOOTH EXTRACTION N/A 12/03/2016   Procedure: DENTAL EXTRACTIONS with Alveoloplasty;  Surgeon: Diona Browner, DDS;  Location: [place];  Service: Oral Surgery;  Laterality: N/A;     Current Outpatient Medications:    Accu-Chek Softclix Lancets lancets, Use to check blood sugar THREE TIMES DAILY, Disp: 100 each, Rfl: 2   albuterol (VENTOLIN HFA) 108 (90 Base) MCG/ACT inhaler, Inhale 2 puffs into the lungs every 4 (four) hours as needed for wheezing or shortness of breath., Disp: 18 g, Rfl: 2   amitriptyline (ELAVIL) 25 MG tablet, Take 1 tablet (25 mg total) by mouth at bedtime., Disp: 90 tablet, Rfl: 3   Blood Glucose Monitoring Suppl (ACCU-CHEK GUIDE ME) w/Device KIT, Use to check blood sugar TID., Disp: 1 kit, Rfl: 0   cetirizine (ZYRTEC) 10 MG tablet, Take 1 tablet (10 mg total) by mouth daily., Disp: 30 tablet, Rfl: 0   Continuous Blood Gluc Sensor (DEXCOM G7 SENSOR) MISC, 1 Device by Does not apply route as directed., Disp: 9 each, Rfl: 4   dicyclomine (BENTYL) 10 MG capsule, TAKE ONE CAPSULE BY MOUTH THREE TIMES DAILY BEFORE MEALS, Disp: 270 capsule, Rfl: 1   diphenoxylate-atropine (LOMOTIL) 2.5-0.025 MG tablet,  TAKE ONE TABLET BY MOUTH TWICE DAILY, Disp: 30 tablet, Rfl: 1   ergocalciferol (DRISDOL) 1.25 MG (50000 UT) capsule, Take 1 capsule (50,000 Units total) by mouth once a week. (Patient taking differently: Take 1,000 Units by mouth daily.), Disp: 12 capsule, Rfl: 1   esomeprazole (NEXIUM) 20 MG capsule, Take 1 capsule (20 mg total) by mouth daily., Disp: 90 capsule, Rfl: 1   fenofibrate (TRICOR) 145 MG tablet, TAKE ONE TABLET BY MOUTH EVERY DAY, Disp: 90 tablet, Rfl: 0   FLUoxetine (PROZAC) 40 MG capsule, Take 1 capsule (40 mg total) by mouth at bedtime., Disp: 90 capsule, Rfl: 1   gabapentin (NEURONTIN) 300 MG capsule, Take 600 mg by  mouth 2 (two) times daily., Disp: , Rfl:    glucose blood (ACCU-CHEK GUIDE) test strip, 1 each by Other route in the morning, at noon, in the evening, and at bedtime. Use as instructed, Disp: 400 each, Rfl: 3   hydrOXYzine (ATARAX) 10 MG tablet, Take 1 tablet (10 mg total) by mouth 3 (three) times daily as needed., Disp: 60 tablet, Rfl: 2   insulin aspart (NOVOLOG FLEXPEN) 100 UNIT/ML FlexPen, Max Daily 50 units per correction scale, Disp: 45 mL, Rfl: 2   insulin glargine (LANTUS SOLOSTAR) 100 UNIT/ML Solostar Pen, Inject 24 Units into the skin daily., Disp: 30 mL, Rfl: 2   Insulin Pen Needle 32G X 4 MM MISC, 1 Device by Does not apply route 4 (four) times daily., Disp: 400 each, Rfl: 2   Lancet Devices (ACCU-CHEK SOFTCLIX) lancets, Use as instructed daily., Disp: 1 each, Rfl: 5   meclizine (ANTIVERT) 25 MG tablet, Take 1 tablet (25 mg total) by mouth 2 (two) times daily as needed for dizziness., Disp: 90 tablet, Rfl: 0   metoprolol succinate (TOPROL-XL) 25 MG 24 hr tablet, Take 1 tablet (25 mg total) by mouth daily., Disp: 90 tablet, Rfl: 0   mupirocin ointment (BACTROBAN) 2 %, Apply 1 application. topically 2 (two) times daily., Disp: 22 g, Rfl: 1   promethazine (PHENERGAN) 25 MG tablet, 1/2-1 tab every 4 to 6 hours prn nausea, Disp: 20 tablet, Rfl: 0   rosuvastatin (CRESTOR) 10 MG tablet, Take 1 tablet (10 mg total) by mouth daily., Disp: 90 tablet, Rfl: 1 No current facility-administered medications for this visit.  Facility-Administered Medications Ordered in Other Visits:    regadenoson (LEXISCAN) injection SOLN 0.4 mg, 0.4 mg, Intravenous, Once, Meda Coffee, Jamse Belfast, MD   technetium tetrofosmin (TC-MYOVIEW) injection 30.8 millicurie, 65.7 millicurie, Intravenous, Once PRN, Dorothy Spark, MD  Allergies  Allergen Reactions   Biaxin [Clarithromycin] Anaphylaxis and Swelling   Clarithromycin Shortness Of Breath and Swelling   Lisinopril Swelling and Cough    Lip edema   Sulfa  Antibiotics Anaphylaxis, Shortness Of Breath, Swelling and Hypertension   Chlorthalidone Nausea And Vomiting and Other (See Comments)    Clammy, Tachycardia, Headache    Daptomycin Nausea And Vomiting   Latex Hives and Rash   Amoxicillin-Pot Clavulanate Diarrhea    Has patient had a PCN reaction causing immediate rash, facial/tongue/throat swelling, SOB or lightheadedness with hypotension:No Has patient had a PCN reaction causing severe rash involving mucus membranes or skin necrosis:No Has patient had a PCN reaction that required hospitalization:No Has patient had a PCN reaction occurring within THE LAST 10 YEARS.  #  #  #  YES  #  #  #  If all of the above answers are "NO", then may proceed with Cephalosporin use.    Aspirin Nausea  And Vomiting and Rash    On 328m dosage   Cholestyramine Nausea And Vomiting   Dilaudid [Hydromorphone Hcl] Nausea And Vomiting   Lasix [Furosemide] Nausea And Vomiting and Other (See Comments)    Headache           Objective:  Physical Exam  General: AAO x3, NAD  Dermatological: Nails appear to be hypertrophic, dystrophic with yellow discoloration and some brown discoloration but no hyperpigmentation.  Incurvation present hallux nail borders.  There is no erythema or warmth.  Mild tenderness palpation to the nails 1 through 5 bilaterally mostly over the hallux toenails.  No signs of infection noted today.  Vascular: Dorsalis Pedis artery and Posterior Tibial artery pedal pulses are 2/4 bilateral with immedate capillary fill time.  There is no pain with calf compression, swelling, warmth, erythema.   Neruologic: Grossly intact via light touch bilateral.  Musculoskeletal: No gross boney pedal deformities bilateral. No pain, crepitus, or limitation noted with foot and ankle range of motion bilateral. Muscular strength 5/5 in all groups tested bilateral.  Gait: Unassisted, Nonantalgic.       Assessment:   Ingrown toenail, symptomatic  onychomycosis; bunion     Plan:  -Treatment options discussed including all alternatives, risks, and complications -Etiology of symptoms were discussed -3 views of the feet were obtained bilaterally.  Mild to moderate bunions present.  No subacute fracture. -We discussed partial nail avulsions however given her smoking as well as elevated glucose this does put her at increased risk of nonhealing.  Hesitant to do chemical matricectomy.  After discussion she agrees to proceed with debridement and treating the toenail fungus.  Discussed different options for this including oral, topical as well as alternative treatments.  Most proceed with oral Lamisil.  We will check a CBC and LFT prior to starting medication. -I did sharply debride the nails x10 without any complications or bleeding. -For the bunion we discussed with conservative as well as surgical options.  Recommend holding off on surgery for now.  Discussed modification of offloading, padding as well as topical anti-inflammatory such as Voltaren gel and other topical medications to help as needed.  MTrula SladeDPM

## 2022-04-23 NOTE — Patient Instructions (Addendum)
Ingrown Toenail  An ingrown toenail occurs when the corner or sides of a toenail grow into the surrounding skin. This causes discomfort and pain. The big toe is most commonly affected, but any of the toes can be affected. If an ingrown toenail is not treated, it can become infected. What are the causes? This condition may be caused by: Wearing shoes that are too small or tight. An injury, such as stubbing your toe or having your toe stepped on. Improper cutting or care of your toenails. Having nail or foot abnormalities that were present from birth (congenital abnormalities), such as having a nail that is too big for your toe. What increases the risk? The following factors may make you more likely to develop ingrown toenails: Age. Nails tend to get thicker with age, so ingrown nails are more common among older people. Cutting your toenails incorrectly, such as cutting them very short or cutting them unevenly. An ingrown toenail is more likely to get infected if you have: Diabetes. Blood flow (circulation) problems. What are the signs or symptoms? Symptoms of an ingrown toenail may include: Pain, soreness, or tenderness. Redness. Swelling. Hardening of the skin that surrounds the toenail. Signs that an ingrown toenail may be infected include: Fluid or pus. Symptoms that get worse. How is this diagnosed? Ingrown toenails may be diagnosed based on: Your symptoms and medical history. A physical exam. Labs or tests. If you have fluid or blood coming from your toenail, a sample may be collected to test for the specific type of bacteria that is causing the infection. How is this treated? Treatment depends on the severity of your symptoms. You may be able to care for your toenail at home. If you have an infection, you may be prescribed antibiotic medicines. If you have fluid or pus draining from your toenail, your health care provider may drain it. If you have trouble walking, you may be  given crutches to use. If you have a severe or infected ingrown toenail, you may need a procedure to remove part or all of the nail. Follow these instructions at home: Foot care  Check your wound every day for signs of infection, or as often as told by your health care provider. Check for: More redness, swelling, or pain. More fluid or blood. Warmth. Pus or a bad smell. Do not pick at your toenail or try to remove it yourself. Soak your foot in warm, soapy water. Do this for 20 minutes, 3 times a day, or as often as told by your health care provider. This helps to keep your toe clean and your skin soft. Wear shoes that fit well and are not too tight. Your health care provider may recommend that you wear open-toed shoes while you heal. Trim your toenails regularly and carefully. Cut your toenails straight across to prevent injury to the skin at the corners of the toenail. Do not cut your nails in a curved shape. Keep your feet clean and dry to help prevent infection. General instructions Take over-the-counter and prescription medicines only as told by your health care provider. If you were prescribed an antibiotic, take it as told by your health care provider. Do not stop taking the antibiotic even if you start to feel better. If your health care provider told you to use crutches to help you move around, use them as instructed. Return to your normal activities as told by your health care provider. Ask your health care provider what activities are safe for you.   Keep all follow-up visits. This is important. Contact a health care provider if: You have more redness, swelling, pain, or other symptoms that do not improve with treatment. You have fluid, blood, or pus coming from your toenail. You have a red streak on your skin that starts at your foot and spreads up your leg. You have a fever. Summary An ingrown toenail occurs when the corner or sides of a toenail grow into the surrounding skin.  This causes discomfort and pain. The big toe is most commonly affected, but any of the toes can be affected. If an ingrown toenail is not treated, it can become infected. Fluid or pus draining from your toenail is a sign of infection. Your health care provider may need to drain it. You may be given antibiotics to treat the infection. Trimming your toenails regularly and properly can help you prevent an ingrown toenail. This information is not intended to replace advice given to you by your health care provider. Make sure you discuss any questions you have with your health care provider. Document Revised: 10/14/2020 Document Reviewed: 10/14/2020 Elsevier Patient Education  [employer]. Terbinafine Tablets What is this medication? TERBINAFINE (TER bin a feen) treats fungal infections of the nails. It belongs to a group of medications called antifungals. It will not treat infections caused by bacteria or viruses. This medicine may be used for other purposes; ask your health care provider or pharmacist if you have questions. COMMON BRAND NAME(S): Lamisil, Terbinex What should I tell my care team before I take this medication? They need to know if you have any of these conditions: Liver disease An unusual or allergic reaction to terbinafine, other medications, foods, dyes, or preservatives Pregnant or trying to get pregnant Breast-feeding How should I use this medication? Take this medication by mouth with water. Take it as directed on the prescription label at the same time every day. You can take it with or without food. If it upsets your stomach, take it with food. Keep taking it unless your care team tells you to stop. A special MedGuide will be given to you by the pharmacist with each prescription and refill. Be sure to read this information carefully each time. Talk to your care team regarding the use of this medication in children. Special care may be needed. Overdosage: If you think  you have taken too much of this medicine contact a poison control center or emergency room at once. NOTE: This medicine is only for you. Do not share this medicine with others. What if I miss a dose? If you miss a dose, take it as soon as you can unless it is more than 4 hours late. If it is more than 4 hours late, skip the missed dose. Take the next dose at the normal time. What may interact with this medication? Do not take this medication with any of the following: Pimozide Thioridazine This medication may also interact with the following: Beta blockers Caffeine Certain medications for mental health conditions Cimetidine Cyclosporine Medications for fungal infections like fluconazole and ketoconazole Medications for irregular heartbeat like amiodarone, flecainide and propafenone Rifampin Warfarin This list may not describe all possible interactions. Give your health care provider a list of all the medicines, herbs, non-prescription drugs, or dietary supplements you use. Also tell them if you smoke, drink alcohol, or use illegal drugs. Some items may interact with your medicine. What should I watch for while using this medication? Visit your care team for regular checks on  your progress. You may need blood work while you are taking this medication. It may be some time before you see the benefit from this medication. This medication may cause serious skin reactions. They can happen weeks to months after starting the medication. Contact your care team right away if you notice fevers or flu-like symptoms with a rash. The rash may be red or purple and then turn into blisters or peeling of the skin. Or, you might notice a red rash with swelling of the face, lips or lymph nodes in your neck or under your arms. This medication can make you more sensitive to the sun. Keep out of the sun, If you cannot avoid being in the sun, wear protective clothing and sunscreen. Do not use sun lamps or tanning  beds/booths. What side effects may I notice from receiving this medication? Side effects that you should report to your care team as soon as possible: Allergic reactions--skin rash, itching, hives, swelling of the face, lips, tongue, or throat Change in sense of smell Change in taste Infection--fever, chills, cough, or sore throat Liver injury--right upper belly pain, loss of appetite, nausea, light-colored stool, dark yellow or brown urine, yellowing skin or eyes, unusual weakness or fatigue Low red blood cell level--unusual weakness or fatigue, dizziness, headache, trouble breathing Lupus-like syndrome--joint pain, swelling, or stiffness, butterfly-shaped rash on the face, rashes that get worse in the sun, fever, unusual weakness or fatigue Rash, fever, and swollen lymph nodes Redness, blistering, peeling, or loosening of the skin, including inside the mouth Unusual bruising or bleeding Worsening mood, feelings of depression Side effects that usually do not require medical attention (report to your care team if they continue or are bothersome): Diarrhea Gas Headache Nausea Stomach pain Upset stomach This list may not describe all possible side effects. Call your doctor for medical advice about side effects. You may report side effects to FDA at 1-800-FDA-1088. Where should I keep my medication? Keep out of the reach of children and pets. Store between 20 and 25 degrees C (68 and 77 degrees F). Protect from light. Get rid of any unused medication after the expiration date. To get rid of medications that are no longer needed or have expired: Take the medication to a medication take-back program. Check with your pharmacy or law enforcement to find a location. If you cannot return the medication, check the label or package insert to see if the medication should be thrown out in the garbage or flushed down the toilet. If you are not sure, ask your care team. If it is safe to put it in the  trash, take the medication out of the container. Mix the medication with cat litter, dirt, coffee grounds, or other unwanted substance. Seal the mixture in a bag or container. Put it in the trash. NOTE: This sheet is a summary. It may not cover all possible information. If you have questions about this medicine, talk to your doctor, pharmacist, or health care provider.  2023 Elsevier/Gold Standard (2021-01-06 00:00:00) Bunion A bunion (hallux valgus) is a bump that forms slowly on the inner side of the big toe joint. It occurs when the big toe turns toward the second toe. Bunions may be small at first, but they often get larger over time. They can make walking painful. What are the causes? This condition may be caused by: Wearing narrow or pointed shoes that force the big toe to press against the other toes. Abnormal foot development that causes the foot to  roll inward. Changes in the foot that are caused by certain diseases, such as rheumatoid arthritis or polio. A foot injury. What increases the risk? The following factors may make you more likely to develop this condition: Wearing shoes that squeeze the toes together. Having certain diseases, such as: Rheumatoid arthritis. Polio. Cerebral palsy. Having family members who have bunions. Being born with abnormally shaped feet (a foot deformity), such as flat feet or low arches. Doing activities that put a lot of pressure on the feet, such as ballet dancing. What are the signs or symptoms?  The main symptom of this condition is a bump on your big toe that you can notice. Other symptoms may include: Pain. Redness and inflammation around your big toe. Thick or hardened skin on your big toe or between your toes. Stiffness or loss of motion in your big toe. Trouble with walking. How is this diagnosed? This condition may be diagnosed based on your symptoms, medical history, and activities. You may also have tests and imaging, such  as: X-rays. These allow your health care provider to check the position of the bones in your foot and look for damage to your joint. They also help your health care provider determine the severity of your bunion and the best way to treat it. Joint aspiration. In this test, a sample of fluid is removed from the toe joint. This test may be done if you are in a lot of pain. It helps rule out diseases that cause painful swelling of the joints, such as arthritis or gout. How is this treated? Treatment depends on the severity of your symptoms. The goal of treatment is to relieve symptoms and prevent your bunion from getting worse. Your health care provider may recommend: Wearing shoes that have a wide toe box, or using bunion pads to cushion the affected area. Taping your toes together to keep them in a normal position. Placing a device inside your shoe (orthotic device) to help reduce pressure on your toe joint. Taking medicine to ease pain and inflammation. Putting ice or heat on the affected area. Doing stretching exercises. Surgery, for severe cases. Follow these instructions at home: Managing pain, stiffness, and swelling     If directed, put ice on the painful area. To do this: Put ice in a plastic bag. Place a towel between your skin and the bag. Leave the ice on for 20 minutes, 2-3 times a day. Remove the ice if your skin turns bright red. This is very important. If you cannot feel pain, heat, or cold, you have a greater risk of damage to the area. If directed, apply heat to the affected area before you exercise. Use the heat source that your health care provider recommends, such as a moist heat pack or a heating pad. Place a towel between your skin and the heat source. Leave the heat on for 20-30 minutes. Remove the heat if your skin turns bright red. This is especially important if you are unable to feel pain, heat, or cold. You have a greater risk of getting burned. General  instructions Do exercises as told by your health care provider. Support your toe joint with proper footwear, shoe padding, or taping as told by your health care provider. Take over-the-counter and prescription medicines only as told by your health care provider. Do not use any products that contain nicotine or tobacco, such as cigarettes, e-cigarettes, and chewing tobacco. If you need help quitting, ask your health care provider. Keep all follow-up  visits. This is important. Contact a health care provider if: Your symptoms get worse. Your symptoms do not improve in 2 weeks. Get help right away if: You have severe pain and trouble with walking. Summary A bunion is a bump on the inner side of the big toe joint that forms when the big toe turns toward the second toe. Bunions can make walking painful. Treatment depends on the severity of your symptoms. Support your toe joint with proper footwear, shoe padding, or taping as told by your health care provider. This information is not intended to replace advice given to you by your health care provider. Make sure you discuss any questions you have with your health care provider. Document Revised: 10/19/2019 Document Reviewed: 10/19/2019 Elsevier Patient Education  2023 [employer].

## 2022-04-27 LAB — CBC WITH DIFFERENTIAL/PLATELET
Absolute Monocytes: 470 cells/uL (ref 200–950)
Basophils Absolute: 47 cells/uL (ref 0–200)
Basophils Relative: 0.5 %
Eosinophils Absolute: 235 cells/uL (ref 15–500)
Eosinophils Relative: 2.5 %
HCT: 42.7 % (ref 35.0–45.0)
Hemoglobin: 14.1 g/dL (ref 11.7–15.5)
Lymphs Abs: 2341 cells/uL (ref 850–3900)
MCH: 28.8 pg (ref 27.0–33.0)
MCHC: 33 g/dL (ref 32.0–36.0)
MCV: 87.3 fL (ref 80.0–100.0)
MPV: 13.2 fL — ABNORMAL HIGH (ref 7.5–12.5)
Monocytes Relative: 5 %
Neutro Abs: 6307 cells/uL (ref 1500–7800)
Neutrophils Relative %: 67.1 %
Platelets: 164 10*3/uL (ref 140–400)
RBC: 4.89 10*6/uL (ref 3.80–5.10)
RDW: 12.4 % (ref 11.0–15.0)
Total Lymphocyte: 24.9 %
WBC: 9.4 10*3/uL (ref 3.8–10.8)

## 2022-04-27 LAB — HEPATIC FUNCTION PANEL
AG Ratio: 1.4 (calc) (ref 1.0–2.5)
ALT: 9 U/L (ref 6–29)
AST: 12 U/L (ref 10–35)
Albumin: 4 g/dL (ref 3.6–5.1)
Alkaline phosphatase (APISO): 63 U/L (ref 37–153)
Bilirubin, Direct: 0.1 mg/dL (ref 0.0–0.2)
Globulin: 2.8 g/dL (calc) (ref 1.9–3.7)
Indirect Bilirubin: 0.3 mg/dL (calc) (ref 0.2–1.2)
Total Bilirubin: 0.4 mg/dL (ref 0.2–1.2)
Total Protein: 6.8 g/dL (ref 6.1–8.1)

## 2022-04-28 ENCOUNTER — Other Ambulatory Visit: Payer: Self-pay | Admitting: Podiatry

## 2022-04-28 DIAGNOSIS — Z79899 Other long term (current) drug therapy: Secondary | ICD-10-CM

## 2022-04-28 MED ORDER — TERBINAFINE HCL 250 MG PO TABS: 250.0000 mg | ORAL_TABLET | Freq: Every day | ORAL | 0 refills | Status: DC

## 2022-04-29 ENCOUNTER — Other Ambulatory Visit: Payer: Self-pay | Admitting: Family Medicine

## 2022-04-29 DIAGNOSIS — F419 Anxiety disorder, unspecified: Secondary | ICD-10-CM

## 2022-04-29 DIAGNOSIS — K580 Irritable bowel syndrome with diarrhea: Secondary | ICD-10-CM

## 2022-04-30 ENCOUNTER — Encounter: Payer: Self-pay | Admitting: Family Medicine

## 2022-04-30 ENCOUNTER — Telehealth: Payer: Self-pay | Admitting: Family Medicine

## 2022-04-30 DIAGNOSIS — Z1231 Encounter for screening mammogram for malignant neoplasm of breast: Secondary | ICD-10-CM

## 2022-04-30 DIAGNOSIS — N6459 Other signs and symptoms in breast: Secondary | ICD-10-CM

## 2022-04-30 NOTE — Telephone Encounter (Signed)
Referral Request - Has patient seen PCP for this complaint? No.  Referral for which specialty: Breast center  Preferred provider/office: [facility]  Reason for referral: mammogram   Pt states she has a vibration sensation in her breast and was advised by the breast center to get a referral to be seen sooner

## 2022-05-03 NOTE — Telephone Encounter (Signed)
Left message on voicemail advising referral was place to [facility] and that she can call to schedule an apt.

## 2022-05-03 NOTE — Telephone Encounter (Signed)
Referral has been placed. 

## 2022-05-04 NOTE — Telephone Encounter (Signed)
Patient aware that referral was placed. Spoke to patient today.

## 2022-05-06 ENCOUNTER — Other Ambulatory Visit: Payer: Self-pay | Admitting: Family Medicine

## 2022-05-06 DIAGNOSIS — N6459 Other signs and symptoms in breast: Secondary | ICD-10-CM

## 2022-05-12 ENCOUNTER — Telehealth: Payer: Self-pay | Admitting: Nutrition

## 2022-05-12 NOTE — Telephone Encounter (Signed)
Please call in script for OmniPod starter kit and G6 sensors and transmitter to her pharmacy.  She has the pods.  Also she will need her Novolog in a vial.  She will be on 40u/day   We are starter her pump on 05/29/22.  Thank you

## 2022-05-14 ENCOUNTER — Ambulatory Visit: Payer: Self-pay | Admitting: *Deleted

## 2022-05-14 NOTE — Telephone Encounter (Signed)
Summary: COVID advice with [place] and Dizziness   Pt is calling to report that she tested positive for COVID on 05/08/22. Pt went to UC and was prescribed paxloivid. Pt reports that she is not feeling better. Her grandson tested postive for RSV. Sx include congestion, nausea, dizziness.       Chief Complaint: requesting additional medication for covid sx and boils on face Symptoms: covid positive 05/08/22. Completed course of paxlovid yesterday. Feeling some better but C/o nausea, dizziness. Congestion, coughing spells. C/o boils/abscesses started to form of face x 5 areas. Fever 99.0. denies dehydration. Reports grandson positive for RSV. Took 2 mucinex instead of 1 today .  Frequency: since 05/08/22 Pertinent Negatives: Patient denies chest pain no difficulty breathing.  Disposition: [] ED /[x] Urgent Care (no appt availability in office) / [] Appointment(In office/virtual)/ []  [facility]/ [] Home Care/ [x] Refused Recommended Disposition /[] [facility]/ []  Follow-up with PCP Additional Notes:   Patient requesting doxycyline for abscesses areas on face forming and reports PCP knows she gets these. No available appt. Recommended UC or UC VV. Patient does not want appt or to go around other. Requesting a call back if medication can be called in to pharmacy. Reports she was waiting to compete paxlovid before requesting doxycyline. Please advise.      Reason for Disposition  [1] Continuous (nonstop) coughing interferes with work or school AND [2] no improvement using cough treatment per Care Advice  [1] Boil AND [2] diabetes mellitus or weak immune system (e.g., HIV positive, cancer chemo, splenectomy, organ transplant, chronic steroids)  Answer Assessment - Initial Assessment Questions 1. COVID-19 DIAGNOSIS: "How do you know that you have COVID?" (e.g., positive lab test or self-test, diagnosed by doctor or NP/PA, symptoms after exposure).     Positive covid test  05/08/22. 2. COVID-19 EXPOSURE: "Was there any known exposure to COVID before the symptoms began?" CDC Definition of close contact: within 6 feet (2 meters) for a total of 15 minutes or more over a 24-hour period.      Na  3. ONSET: "When did the COVID-19 symptoms start?"      na 4. WORST SYMPTOM: "What is your worst symptom?" (e.g., cough, fever, shortness of breath, muscle aches)     Nausea, dizziness, congestion, cough 5. COUGH: "Do you have a cough?" If Yes, ask: "How bad is the cough?"       Coughing spells 6. FEVER: "Do you have a fever?" If Yes, ask: "What is your temperature, how was it measured, and when did it start?"     99.0 7. RESPIRATORY STATUS: "Describe your breathing?" (e.g., normal; shortness of breath, wheezing, unable to speak)      Denies  8. BETTER-SAME-WORSE: "Are you getting better, staying the same or getting worse compared to yesterday?"  If getting worse, ask, "In what way?"     No better and completed course of paxlovid  9. OTHER SYMPTOMS: "Do you have any other symptoms?"  (e.g., chills, fatigue, headache, loss of smell or taste, muscle pain, sore throat)     Coughing spells, nausea, dizziness, congestion 10. HIGH RISK DISEASE: "Do you have any chronic medical problems?" (e.g., asthma, heart or lung disease, weak immune system, obesity, etc.)       Hx diabetic  11. VACCINE: "Have you had the COVID-19 vaccine?" If Yes, ask: "Which one, how many shots, when did you get it?"       na 12. PREGNANCY: "Is there any chance you are pregnant?" "When was  your last menstrual period?"       na 13. O2 SATURATION MONITOR:  "Do you use an oxygen saturation monitor (pulse oximeter) at home?" If Yes, ask "What is your reading (oxygen level) today?" "What is your usual oxygen saturation reading?" (e.g., 95%)       na  Answer Assessment - Initial Assessment Questions 1. APPEARANCE of BOIL: "What does the boil look like?"      Starting to form of face since having covid 2.  LOCATION: "Where is the boil located?"      On face  3. NUMBER: "How many boils are there?"      5 4. SIZE: "How big is the boil?" (e.g., inches, cm; compare to size of a coin or other object)     na 5. ONSET: "When did the boil start?"     Since 05/08/22 6. PAIN: "Is there any pain?" If Yes, ask: "How bad is the pain?"   (Scale 1-10; or mild, moderate, severe)     na 7. FEVER: "Do you have a fever?" If Yes, ask: "What is it, how was it measured, and when did it start?"      99.0 8. SOURCE: "Have you been around anyone with boils or other Staph infections?" "Have you ever had boils before?"     na 9. OTHER SYMPTOMS: "Do you have any other symptoms?" (e.g., shaking chills, weakness, rash elsewhere on body)     Covid sx 10. PREGNANCY: "Is there any chance you are pregnant?" "When was your last menstrual period?"       na  Protocols used: Coronavirus (COVID-19) Diagnosed or Suspected-A-AH, Boil (Skin Abscess)-A-AH

## 2022-05-14 NOTE — Telephone Encounter (Signed)
Pt called and informed of pcp response.

## 2022-05-14 NOTE — Telephone Encounter (Signed)
If she has completed treatment with Paxlovid and she continues to not feel well she needs an in person evaluation especially due to her underlying history of diabetes and cardiac disease.  I will recommend she go to the ED to be seen as this is a weekend and her symptoms could deteriorate if not addressed promptly.

## 2022-05-14 NOTE — Telephone Encounter (Signed)
Routing to PCP for review.

## 2022-05-17 ENCOUNTER — Other Ambulatory Visit: Payer: Self-pay | Admitting: Internal Medicine

## 2022-05-31 ENCOUNTER — Other Ambulatory Visit: Payer: Self-pay | Admitting: Internal Medicine

## 2022-06-01 ENCOUNTER — Ambulatory Visit: Payer: Medicaid Other | Admitting: Nutrition

## 2022-06-02 ENCOUNTER — Encounter: Payer: Self-pay | Admitting: Family Medicine

## 2022-06-02 ENCOUNTER — Ambulatory Visit: Payer: Medicaid Other | Admitting: Physician Assistant

## 2022-06-02 ENCOUNTER — Ambulatory Visit: Payer: Medicaid Other | Attending: Physician Assistant | Admitting: Family Medicine

## 2022-06-02 VITALS — BP 150/87 | HR 88 | Ht 67.0 in | Wt 150.0 lb

## 2022-06-02 DIAGNOSIS — I152 Hypertension secondary to endocrine disorders: Secondary | ICD-10-CM

## 2022-06-02 DIAGNOSIS — H8113 Benign paroxysmal vertigo, bilateral: Secondary | ICD-10-CM | POA: Diagnosis not present

## 2022-06-02 DIAGNOSIS — E1149 Type 2 diabetes mellitus with other diabetic neurological complication: Secondary | ICD-10-CM

## 2022-06-02 DIAGNOSIS — F17210 Nicotine dependence, cigarettes, uncomplicated: Secondary | ICD-10-CM | POA: Diagnosis not present

## 2022-06-02 DIAGNOSIS — K580 Irritable bowel syndrome with diarrhea: Secondary | ICD-10-CM | POA: Diagnosis not present

## 2022-06-02 DIAGNOSIS — Z794 Long term (current) use of insulin: Secondary | ICD-10-CM

## 2022-06-02 DIAGNOSIS — E1159 Type 2 diabetes mellitus with other circulatory complications: Secondary | ICD-10-CM

## 2022-06-02 DIAGNOSIS — Z20822 Contact with and (suspected) exposure to covid-19: Secondary | ICD-10-CM

## 2022-06-02 DIAGNOSIS — L0292 Furuncle, unspecified: Secondary | ICD-10-CM

## 2022-06-02 DIAGNOSIS — I10 Essential (primary) hypertension: Secondary | ICD-10-CM

## 2022-06-02 MED ORDER — METOPROLOL SUCCINATE ER 50 MG PO TB24: 50.0000 mg | ORAL_TABLET | Freq: Every day | ORAL | 1 refills | Status: DC

## 2022-06-02 MED ORDER — BENZONATATE 100 MG PO CAPS: 100.0000 mg | ORAL_CAPSULE | Freq: Two times a day (BID) | ORAL | 0 refills | Status: DC | PRN

## 2022-06-02 MED ORDER — MECLIZINE HCL 25 MG PO TABS: 25.0000 mg | ORAL_TABLET | Freq: Two times a day (BID) | ORAL | 3 refills | Status: DC | PRN

## 2022-06-02 MED ORDER — DOXYCYCLINE HYCLATE 100 MG PO TABS: 100.0000 mg | ORAL_TABLET | Freq: Two times a day (BID) | ORAL | 0 refills | Status: DC

## 2022-06-02 MED ORDER — DIPHENOXYLATE-ATROPINE 2.5-0.025 MG PO TABS: 1.0000 | ORAL_TABLET | Freq: Two times a day (BID) | ORAL | 2 refills | Status: DC

## 2022-06-02 MED ORDER — NICOTINE 14 MG/24HR TD PT24: 14.0000 mg | MEDICATED_PATCH | Freq: Every day | TRANSDERMAL | 1 refills | Status: DC

## 2022-06-02 MED ORDER — FLUCONAZOLE 150 MG PO TABS: 150.0000 mg | ORAL_TABLET | Freq: Once | ORAL | 0 refills | Status: AC

## 2022-06-02 NOTE — Progress Notes (Signed)
Covid exposure, has cough Medication refill(90 day Supply)

## 2022-06-02 NOTE — Progress Notes (Signed)
Subjective:  Patient ID: [name], female    DOB: 12-23-1960  Age: 61 y.o. MRN: [id number]  CC: Cough   HPI [name] is a 61 y.o. year old female with a history of  type 2 diabetes mellitus (A1c 8.3), hypertension, CHF (EF 60-65%  2-D echo of 08/2021), anxiety, GERD, COVID-19 in 01/2020, greater than 20-pack-year history.   Interval History: Her Grandsons and daughter have COVID and one of them has RSV She has had a cough which she has had for the last 1 month since she had COVID .  Denies presence of additional symptoms.  She was exposed yesterday when she picked up her Grandson to take for doctor's appointment.  Saw Endocrine in 03/2022 for management of her diabetes mellitus and reports doing well on her current regimen.  She has been breaking out in rash under her chin and right shoulder which flares up intermittently. This starts out at as a little bump. It is not draining For IBS she is on Bentyl and reports control of her symptoms on her medication.  Vertigo is also controlled on meclizine. Her blood pressure is elevated and she endorses adherence with her antihypertensives.  Blood pressures at home have also been elevated.  From a cardiac standpoint she has no dyspnea, palpitations, pedal edema. She has smoked 1 ppd since she was 61  Past Medical History:  Diagnosis Date   Anxiety    Arthritis    Chronic systolic heart failure (HCC)    EF 20-25% ECHO 05/2015   COPD (chronic obstructive pulmonary disease) (HCC)    Depression    Diabetes mellitus without complication (HCC)    Type II   Dysrhythmia    pt unsure of name of arrythmia - " my heart rate will drop all of a sudden" -no current treatment - atrial flutter   Fibrillation, atrial (HCC)    Gallstones    GERD (gastroesophageal reflux disease)    Headache(784.0)    migraines   Hypertension    Osteomyelitis (HCC)    R ankle   Rash    arms    Past Surgical History:  Procedure Laterality Date    ANKLE FRACTURE SURGERY Right 2015   CARDIAC CATHETERIZATION N/A 06/24/2015   Procedure: Left Heart Cath and Coronary Angiography;  Surgeon: Belva Crome, MD;  Location: [facility];  Service: Cardiovascular;  Laterality: N/A;   CARPAL TUNNEL RELEASE     bil   CHOLECYSTECTOMY  11/24/2011   Procedure: LAPAROSCOPIC CHOLECYSTECTOMY WITH INTRAOPERATIVE CHOLANGIOGRAM;  Surgeon: Joyice Faster. Cornett, MD;  Location: [facility];  Service: General;  Laterality: N/A;  Laparoscopic Cholecystectomy with Cholangiogram   COLONOSCOPY     ECTOPIC PREGNANCY SURGERY  many yrs ago   HARDWARE REMOVAL Right 07/15/2015   Procedure: RIGHT ANKLE HARDWARE REMOVAL, PLACEMENT OF STIMULAN ANTIBIOTIC BEADS AND PREVENA WOUND VAC.;  Surgeon: Meredith Pel, MD;  Location: [facility];  Service: Orthopedics;  Laterality: Right;   TOOTH EXTRACTION N/A 12/03/2016   Procedure: DENTAL EXTRACTIONS with Alveoloplasty;  Surgeon: Diona Browner, DDS;  Location: [facility];  Service: Oral Surgery;  Laterality: N/A;    Family History  Problem Relation Age of Onset   Diabetes Mother    Lung cancer Father    Heart attack Father 60       CABG x4   Breast cancer Sister        Survivor     Social History   Socioeconomic History   Marital  status: Divorced    Spouse name: Not on file   Number of children: Not on file   Years of education: Not on file   Highest education level: Not on file  Occupational History   Not on file  Tobacco Use   Smoking status: Every Day    Packs/day: 1.00    Years: 20.00    Total pack years: 20.00    Types: Cigarettes, E-cigarettes    Start date: 09/19/2016   Smokeless tobacco: Never  Vaping Use   Vaping Use: Every day   Start date: 10/12/2016  Substance and Sexual Activity   Alcohol use: No    Alcohol/week: 0.0 standard drinks of alcohol   Drug use: No   Sexual activity: Not Currently    Birth control/protection: Post-menopausal  Other Topics Concern   Not on file  Social History Narrative    Not on file   Social Determinants of Health   Financial Resource Strain: Not on file  Food Insecurity: Not on file  Transportation Needs: Not on file  Physical Activity: Not on file  Stress: Not on file  Social Connections: Not on file    Allergies  Allergen Reactions   Biaxin [Clarithromycin] Anaphylaxis and Swelling   Clarithromycin Shortness Of Breath and Swelling   Lisinopril Swelling and Cough    Lip edema   Sulfa Antibiotics Anaphylaxis, Shortness Of Breath, Swelling and Hypertension   Chlorthalidone Nausea And Vomiting and Other (See Comments)    Clammy, Tachycardia, Headache    Daptomycin Nausea And Vomiting   Latex Hives and Rash   Amoxicillin-Pot Clavulanate Diarrhea    Has patient had a PCN reaction causing immediate rash, facial/tongue/throat swelling, SOB or lightheadedness with hypotension:No Has patient had a PCN reaction causing severe rash involving mucus membranes or skin necrosis:No Has patient had a PCN reaction that required hospitalization:No Has patient had a PCN reaction occurring within THE LAST 10 YEARS.  #  #  #  YES  #  #  #  If all of the above answers are "NO", then may proceed with Cephalosporin use.    Aspirin Nausea And Vomiting and Rash    On 368m dosage   Cholestyramine Nausea And Vomiting   Dilaudid [Hydromorphone Hcl] Nausea And Vomiting   Lasix [Furosemide] Nausea And Vomiting and Other (See Comments)    Headache     Outpatient Medications Prior to Visit  Medication Sig Dispense Refill   Accu-Chek Softclix Lancets lancets Use to check blood sugar THREE TIMES DAILY 100 each 2   albuterol (VENTOLIN HFA) 108 (90 Base) MCG/ACT inhaler Inhale 2 puffs into the lungs every 4 (four) hours as needed for wheezing or shortness of breath. 18 g 2   amitriptyline (ELAVIL) 25 MG tablet Take 1 tablet (25 mg total) by mouth at bedtime. 90 tablet 3   Blood Glucose Monitoring Suppl (ACCU-CHEK GUIDE ME) w/Device KIT Use to check blood sugar TID. 1 kit 0    cetirizine (ZYRTEC) 10 MG tablet Take 1 tablet (10 mg total) by mouth daily. 30 tablet 0   Continuous Blood Gluc Sensor (DEXCOM G7 SENSOR) MISC 1 Device by Does not apply route as directed. 9 each 4   dicyclomine (BENTYL) 10 MG capsule TAKE ONE CAPSULE BY MOUTH THREE TIMES DAILY BEFORE MEALS 270 capsule 1   ergocalciferol (DRISDOL) 1.25 MG (50000 UT) capsule Take 1 capsule (50,000 Units total) by mouth once a week. (Patient taking differently: Take 1,000 Units by mouth daily.) 12 capsule 1  esomeprazole (NEXIUM) 20 MG capsule Take 1 capsule (20 mg total) by mouth daily. 90 capsule 1   fenofibrate (TRICOR) 145 MG tablet TAKE ONE TABLET BY MOUTH EVERY DAY 90 tablet 0   FLUoxetine (PROZAC) 40 MG capsule Take 1 capsule (40 mg total) by mouth at bedtime. 90 capsule 1   gabapentin (NEURONTIN) 300 MG capsule Take 600 mg by mouth 2 (two) times daily.     glucose blood (ACCU-CHEK GUIDE) test strip 1 each by Other route in the morning, at noon, in the evening, and at bedtime. Use as instructed 400 each 3   hydrOXYzine (ATARAX) 10 MG tablet Take 1 tablet (10 mg total) by mouth 3 (three) times daily as needed. 60 tablet 0   insulin aspart (NOVOLOG FLEXPEN) 100 UNIT/ML FlexPen Max Daily 50 units per correction scale 45 mL 2   insulin glargine (LANTUS SOLOSTAR) 100 UNIT/ML Solostar Pen Inject 24 Units into the skin daily. 30 mL 2   Insulin Pen Needle 32G X 4 MM MISC 1 Device by Does not apply route 4 (four) times daily. 400 each 2   Lancet Devices (ACCU-CHEK SOFTCLIX) lancets Use as instructed daily. 1 each 5   mupirocin ointment (BACTROBAN) 2 % Apply 1 application. topically 2 (two) times daily. 22 g 1   pregabalin (LYRICA) 75 MG capsule TAKE 1 CAPSULE BY MOUTH DAILY AND 2 CAPSULES AT BEDTIME     promethazine (PHENERGAN) 25 MG tablet 1/2-1 tab every 4 to 6 hours prn nausea 20 tablet 0   rosuvastatin (CRESTOR) 10 MG tablet Take 1 tablet (10 mg total) by mouth daily. 90 tablet 1   terbinafine (LAMISIL) 250 MG  tablet Take 1 tablet (250 mg total) by mouth daily. 90 tablet 0   diphenoxylate-atropine (LOMOTIL) 2.5-0.025 MG tablet TAKE ONE TABLET BY MOUTH TWICE DAILY 30 tablet 1   meclizine (ANTIVERT) 25 MG tablet Take 1 tablet (25 mg total) by mouth 2 (two) times daily as needed for dizziness. 90 tablet 0   metoprolol succinate (TOPROL-XL) 25 MG 24 hr tablet Take 1 tablet (25 mg total) by mouth daily. 90 tablet 0   Facility-Administered Medications Prior to Visit  Medication Dose Route Frequency Provider Last Rate Last Admin   regadenoson (LEXISCAN) injection SOLN 0.4 mg  0.4 mg Intravenous Once Dorothy Spark, MD       technetium tetrofosmin (TC-MYOVIEW) injection 95.0 millicurie  93.2 millicurie Intravenous Once PRN Dorothy Spark, MD         ROS Review of Systems  Constitutional:  Negative for activity change and appetite change.  HENT:  Negative for sinus pressure and sore throat.   Respiratory:  Positive for cough. Negative for chest tightness, shortness of breath and wheezing.   Cardiovascular:  Negative for chest pain and palpitations.  Gastrointestinal:  Negative for abdominal distention, abdominal pain and constipation.  Genitourinary: Negative.   Musculoskeletal: Negative.   Skin:  Positive for rash.  Psychiatric/Behavioral:  Negative for behavioral problems and dysphoric mood.     Objective:  BP (!) 150/87   Pulse 88   Ht _0  (1.702 m)   Wt 150 lb (68 kg)   LMP 06/09/2011 Comment: verified BEFORE imaging  SpO2 99%   BMI 23.49 kg/m      06/02/2022   10:04 AM 04/20/2022   11:08 AM 04/09/2022    1:48 PM  BP/Weight  Systolic BP 671  245  Diastolic BP 87  84  Wt. (Lbs) 150 149 146  BMI 23.49  kg/m2 23.34 kg/m2 22.87 kg/m2      Physical Exam Constitutional:      Appearance: She is well-developed.  Cardiovascular:     Rate and Rhythm: Normal rate.     Heart sounds: Normal heart sounds. No murmur heard. Pulmonary:     Effort: Pulmonary effort is normal.      Breath sounds: Normal breath sounds. No wheezing or rales.  Chest:     Chest wall: No tenderness.  Abdominal:     General: Bowel sounds are normal. There is no distension.     Palpations: Abdomen is soft. There is no mass.     Tenderness: There is no abdominal tenderness.  Musculoskeletal:        General: Normal range of motion.     Right lower leg: No edema.     Left lower leg: No edema.  Skin:    Comments: Erythematous lesions on upper back and lower chin with no discharge  Neurological:     Mental Status: She is alert and oriented to person, place, and time.  Psychiatric:        Mood and Affect: Mood normal.        Latest Ref Rng & Units 04/26/2022   10:38 AM 11/25/2021    4:45 PM 11/05/2021    3:47 PM  CMP  Glucose 70 - 99 mg/dL  151  174   BUN 8 - 27 mg/dL  9  11   Creatinine 0.57 - 1.00 mg/dL  0.61  0.55   Sodium 134 - 144 mmol/L  140  138   Potassium 3.5 - 5.2 mmol/L  4.0  3.7   Chloride 96 - 106 mmol/L  102  108   CO2 20 - 29 mmol/L  21  23   Calcium 8.7 - 10.3 mg/dL  9.5  9.1   Total Protein 6.1 - 8.1 g/dL 6.8  7.2    Total Bilirubin 0.2 - 1.2 mg/dL 0.4  <0.2    Alkaline Phos 44 - 121 IU/L  71    AST 10 - 35 U/L 12  11    ALT 6 - 29 U/L 9  6      Lipid Panel     Component Value Date/Time   CHOL 114 06/11/2021 0842   TRIG 316 (H) 06/11/2021 0842   HDL 28 (L) 06/11/2021 0842   CHOLHDL 5.6 (H) 07/31/2019 1004   CHOLHDL 5.1 (H) 07/05/2016 0947   VLDL UNABLE TO CALCULATE IF TRIGLYCERIDE OVER 400 mg/dL 05/24/2016 0415   LDLCALC 39 06/11/2021 0842    CBC    Component Value Date/Time   WBC 9.4 04/26/2022 1038   RBC 4.89 04/26/2022 1038   HGB 14.1 04/26/2022 1038   HGB 14.9 11/25/2021 1645   HCT 42.7 04/26/2022 1038   HCT 45.1 11/25/2021 1645   PLT 164 04/26/2022 1038   PLT 190 11/25/2021 1645   MCV 87.3 04/26/2022 1038   MCV 88 11/25/2021 1645   MCH 28.8 04/26/2022 1038   MCHC 33.0 04/26/2022 1038   RDW 12.4 04/26/2022 1038   RDW 12.5 11/25/2021  1645   LYMPHSABS 2,341 04/26/2022 1038   LYMPHSABS 3.0 11/25/2021 1645   MONOABS 0.6 11/05/2021 1547   EOSABS 235 04/26/2022 1038   EOSABS 0.3 11/25/2021 1645   BASOSABS 47 04/26/2022 1038   BASOSABS 0.1 11/25/2021 1645    Lab Results  Component Value Date   HGBA1C 8.3 (A) 04/09/2022    Assessment & Plan:  1. Irritable bowel  syndrome with diarrhea Stable - diphenoxylate-atropine (LOMOTIL) 2.5-0.025 MG tablet; Take 1 tablet by mouth 2 (two) times daily.  Dispense: 60 tablet; Refill: 2  2. Smoking greater than 20 pack years Spent 3 minutes counseling on smoking cessation and hazardous effect of smoking and she is willing to work on quitting - [place]; Future - nicotine (NICODERM CQ) 14 mg/24hr patch; Place 1 patch (14 mg total) onto the skin daily.  Dispense: 28 patch; Refill: 1  3. Benign paroxysmal positional vertigo due to bilateral vestibular disorder Controlled - meclizine (ANTIVERT) 25 MG tablet; Take 1 tablet (25 mg total) by mouth 2 (two) times daily as needed for dizziness.  Dispense: 60 tablet; Refill: 3  4. Hypertension complicating diabetes ([place]) Uncontrolled Dose of metoprolol increased Counseled on blood pressure goal of less than 130/80, low-sodium, DASH diet, medication compliance, 150 minutes of moderate intensity exercise per week. Discussed medication compliance, adverse effects. - metoprolol succinate (TOPROL-XL) 50 MG 24 hr tablet; Take 1 tablet (50 mg total) by mouth daily.  Dispense: 90 tablet; Refill: 1  5. Furunculosis Will need to evaluate for MRSA given recurrence - doxycycline (VIBRA-TABS) 100 MG tablet; Take 1 tablet (100 mg total) by mouth 2 (two) times daily.  Dispense: 14 tablet; Refill: 0 - Culture, blood (single) w Reflex to ID Panel; Future - Ambulatory referral to Dermatology  6. Type 2 diabetes mellitus with other neurologic complication, with long-term current use of insulin (HCC) Uncontrolled  with A1c of 8.3 Continue medications per endocrine Counseled on Diabetic diet, my plate method, 330 minutes of moderate intensity exercise/week Blood sugar logs with fasting goals of 80-120 mg/dl, random of less than 180 and in the event of sugars less than 60 mg/dl or greater than 400 mg/dl encouraged to notify the clinic. Advised on the need for annual eye exams, annual foot exams, Pneumonia vaccine. - Microalbumin / creatinine urine ratio; Future - LP+Non-HDL Cholesterol; Future - CMP14+EGFR; Future  7. Exposure to COVID-19 virus Due to cough we will place on Tessalon Perles - benzonatate (TESSALON) 100 MG capsule; Take 1 capsule (100 mg total) by mouth 2 (two) times daily as needed for cough.  Dispense: 20 capsule; Refill: 0 - Novel Coronavirus, NAA ([facility])    Meds ordered this encounter  Medications   doxycycline (VIBRA-TABS) 100 MG tablet    Sig: Take 1 tablet (100 mg total) by mouth 2 (two) times daily.    Dispense:  14 tablet    Refill:  0   diphenoxylate-atropine (LOMOTIL) 2.5-0.025 MG tablet    Sig: Take 1 tablet by mouth 2 (two) times daily.    Dispense:  60 tablet    Refill:  2    This prescription was filled on 04/29/2022. Any refills authorized will be placed on file.   meclizine (ANTIVERT) 25 MG tablet    Sig: Take 1 tablet (25 mg total) by mouth 2 (two) times daily as needed for dizziness.    Dispense:  60 tablet    Refill:  3   metoprolol succinate (TOPROL-XL) 50 MG 24 hr tablet    Sig: Take 1 tablet (50 mg total) by mouth daily.    Dispense:  90 tablet    Refill:  1    Dose increase   nicotine (NICODERM CQ) 14 mg/24hr patch    Sig: Place 1 patch (14 mg total) onto the skin daily.    Dispense:  28 patch    Refill:  1  fluconazole (DIFLUCAN) 150 MG tablet    Sig: Take 1 tablet (150 mg total) by mouth once for 1 dose.    Dispense:  1 tablet    Refill:  0   benzonatate (TESSALON) 100 MG capsule    Sig: Take 1 capsule (100 mg total) by mouth 2 (two) times  daily as needed for cough.    Dispense:  20 capsule    Refill:  0    Follow-up: Return in about 6 months (around 12/02/2022) for Chronic medical conditions.       Charlott Rakes, MD, FAAFP. [facility] [place], [place]   06/02/2022, 12:55 PM

## 2022-06-02 NOTE — Patient Instructions (Signed)
Skin Abscess  A skin abscess is an infected area on or under your skin that contains a collection of pus and other material. An abscess may also be called a furuncle, carbuncle, or boil. An abscess can occur in or on almost any part of your body. Some abscesses break open (rupture) on their own. Most continue to get worse unless they are treated. The infection can spread deeper into the body and eventually into your blood, which can make you feel ill. Treatment usually involves draining the abscess. What are the causes? An abscess occurs when germs, like bacteria, pass through your skin and cause an infection. This may be caused by: A scrape or cut on your skin. A puncture wound through your skin, including a needle injection or insect bite. Blocked oil or sweat glands. Blocked and infected hair follicles. A cyst that forms beneath your skin (sebaceous cyst) and becomes infected. What increases the risk? This condition is more likely to develop in people who: Have a weak body defense system (immune system). Have diabetes. Have dry and irritated skin. Get frequent injections or use illegal IV drugs. Have a foreign body in a wound, such as a splinter. Have problems with their lymph system or veins. What are the signs or symptoms? Symptoms of this condition include: A painful, firm bump under the skin. A bump with pus at the top. This may break through the skin and drain. Other symptoms include: Redness surrounding the abscess site. Warmth. Swelling of the lymph nodes (glands) near the abscess. Tenderness. A sore on the skin. How is this diagnosed? This condition may be diagnosed based on: A physical exam. Your medical history. A sample of pus. This may be used to find out what is causing the infection. Blood tests. Imaging tests, such as an ultrasound, CT scan, or MRI. How is this treated? A small abscess that drains on its own may not need treatment. Treatment for larger abscesses  may include: Moist heat or heat pack applied to the area several times a day. A procedure to drain the abscess (incision and drainage). Antibiotic medicines. For a severe abscess, you may first get antibiotics through an IV and then change to antibiotics by mouth. Follow these instructions at home: Medicines  Take over-the-counter and prescription medicines only as told by your health care provider. If you were prescribed an antibiotic medicine, take it as told by your health care provider. Do not stop taking the antibiotic even if you start to feel better. Abscess care  If you have an abscess that has not drained, apply heat to the affected area. Use the heat source that your health care provider recommends, such as a moist heat pack or a heating pad. Place a towel between your skin and the heat source. Leave the heat on for 20-30 minutes. Remove the heat if your skin turns bright red. This is especially important if you are unable to feel pain, heat, or cold. You may have a greater risk of getting burned. Follow instructions from your health care provider about how to take care of your abscess. Make sure you: Cover the abscess with a bandage (dressing). Change your dressing or gauze as told by your health care provider. Wash your hands with soap and water before you change the dressing or gauze. If soap and water are not available, use hand sanitizer. Check your abscess every day for signs of a worsening infection. Check for: More redness, swelling, or pain. More fluid or blood. Warmth.   More pus or a bad smell. General instructions To avoid spreading the infection: Do not share personal care items, towels, or hot tubs with others. Avoid making skin contact with other people. Keep all follow-up visits as told by your health care provider. This is important. Contact a health care provider if you have: More redness, swelling, or pain around your abscess. More fluid or blood coming from  your abscess. Warm skin around your abscess. More pus or a bad smell coming from your abscess. Muscle aches. Chills or a general ill feeling. Get help right away if you: Have severe pain. See red streaks on your skin spreading away from the abscess. See redness that spreads quickly. Have a fever or chills. Summary A skin abscess is an infected area on or under your skin that contains a collection of pus and other material. A small abscess that drains on its own may not need treatment. Treatment for larger abscesses may include having a procedure to drain the abscess and taking an antibiotic. This information is not intended to replace advice given to you by your health care provider. Make sure you discuss any questions you have with your health care provider. Document Revised: 09/17/2021 Document Reviewed: 03/23/2021 Elsevier Patient Education  2023 [employer] Inc.  

## 2022-06-03 ENCOUNTER — Other Ambulatory Visit: Payer: Self-pay | Admitting: Family Medicine

## 2022-06-03 ENCOUNTER — Other Ambulatory Visit: Payer: Self-pay | Admitting: Pharmacist

## 2022-06-03 ENCOUNTER — Ambulatory Visit: Payer: Medicaid Other | Attending: Family Medicine

## 2022-06-03 DIAGNOSIS — Z794 Long term (current) use of insulin: Secondary | ICD-10-CM

## 2022-06-03 DIAGNOSIS — L0292 Furuncle, unspecified: Secondary | ICD-10-CM

## 2022-06-03 DIAGNOSIS — E1149 Type 2 diabetes mellitus with other diabetic neurological complication: Secondary | ICD-10-CM

## 2022-06-03 LAB — NOVEL CORONAVIRUS, NAA: SARS-CoV-2, NAA: NOT DETECTED

## 2022-06-03 MED ORDER — CETIRIZINE HCL 10 MG PO TABS: 10.0000 mg | ORAL_TABLET | Freq: Every day | ORAL | 0 refills | Status: DC

## 2022-06-03 MED ORDER — MUPIROCIN 2 % EX OINT: 1.0000 | TOPICAL_OINTMENT | Freq: Two times a day (BID) | CUTANEOUS | 1 refills | Status: DC

## 2022-06-03 NOTE — Telephone Encounter (Signed)
Medication filled today # 30.

## 2022-06-04 ENCOUNTER — Other Ambulatory Visit: Payer: Medicaid Other

## 2022-06-04 LAB — HEMOGLOBIN A1C
Est. average glucose Bld gHb Est-mCnc: 194 mg/dL
Hgb A1c MFr Bld: 8.4 % — ABNORMAL HIGH (ref 4.8–5.6)

## 2022-06-06 LAB — CMP14+EGFR
ALT: 8 IU/L (ref 0–32)
AST: 10 IU/L (ref 0–40)
Albumin/Globulin Ratio: 1.3 (ref 1.2–2.2)
Albumin: 4.2 g/dL (ref 3.8–4.9)
Alkaline Phosphatase: 88 IU/L (ref 44–121)
BUN/Creatinine Ratio: 12 (ref 12–28)
BUN: 8 mg/dL (ref 8–27)
Bilirubin Total: 0.3 mg/dL (ref 0.0–1.2)
CO2: 23 mmol/L (ref 20–29)
Calcium: 9.3 mg/dL (ref 8.7–10.3)
Chloride: 102 mmol/L (ref 96–106)
Creatinine, Ser: 0.67 mg/dL (ref 0.57–1.00)
Globulin, Total: 3.2 g/dL (ref 1.5–4.5)
Glucose: 168 mg/dL — ABNORMAL HIGH (ref 70–99)
Potassium: 4.2 mmol/L (ref 3.5–5.2)
Sodium: 139 mmol/L (ref 134–144)
Total Protein: 7.4 g/dL (ref 6.0–8.5)
eGFR: 100 mL/min/{1.73_m2} (ref >59)

## 2022-06-06 LAB — MICROALBUMIN / CREATININE URINE RATIO
Creatinine, Urine: 86.9 mg/dL
Microalb/Creat Ratio: 11 mg/g creat (ref 0–29)
Microalbumin, Urine: 9.8 ug/mL

## 2022-06-06 LAB — LP+NON-HDL CHOLESTEROL
Cholesterol, Total: 142 mg/dL (ref 100–199)
HDL: 43 mg/dL (ref >39)
LDL Chol Calc (NIH): 82 mg/dL (ref 0–99)
Total Non-HDL-Chol (LDL+VLDL): 99 mg/dL (ref 0–129)
Triglycerides: 87 mg/dL (ref 0–149)
VLDL Cholesterol Cal: 17 mg/dL (ref 5–40)

## 2022-06-08 ENCOUNTER — Other Ambulatory Visit: Payer: Self-pay | Admitting: Family Medicine

## 2022-06-08 ENCOUNTER — Other Ambulatory Visit: Payer: Self-pay | Admitting: Internal Medicine

## 2022-06-08 DIAGNOSIS — E1149 Type 2 diabetes mellitus with other diabetic neurological complication: Secondary | ICD-10-CM

## 2022-06-09 LAB — CULTURE, BLOOD (SINGLE)

## 2022-06-12 ENCOUNTER — Other Ambulatory Visit: Payer: Self-pay | Admitting: Family Medicine

## 2022-06-12 DIAGNOSIS — F419 Anxiety disorder, unspecified: Secondary | ICD-10-CM

## 2022-06-14 ENCOUNTER — Other Ambulatory Visit: Payer: Self-pay | Admitting: Family Medicine

## 2022-06-14 DIAGNOSIS — F419 Anxiety disorder, unspecified: Secondary | ICD-10-CM

## 2022-06-29 ENCOUNTER — Other Ambulatory Visit: Payer: Self-pay | Admitting: Family Medicine

## 2022-06-29 DIAGNOSIS — K580 Irritable bowel syndrome with diarrhea: Secondary | ICD-10-CM

## 2022-07-01 ENCOUNTER — Other Ambulatory Visit: Payer: Self-pay | Admitting: Family Medicine

## 2022-07-02 ENCOUNTER — Other Ambulatory Visit: Payer: Self-pay | Admitting: Family Medicine

## 2022-07-02 ENCOUNTER — Other Ambulatory Visit: Payer: Self-pay | Admitting: Internal Medicine

## 2022-07-02 NOTE — Telephone Encounter (Signed)
Requested Prescriptions  Pending Prescriptions Disp Refills   cetirizine (ZYRTEC) 10 MG tablet [Pharmacy Med Name: CETIRIZINE 10MG  TABLETS] 30 tablet 0    Sig: TAKE 1 TABLET(10 MG) BY MOUTH DAILY     Ear, Nose, and Throat:  Antihistamines 2 Passed - 07/01/2022 11:44 PM      Passed - Cr in normal range and within 360 days    Creat  Date Value Ref Range Status  07/05/2016 0.59 0.50 - 1.05 mg/dL Final    Comment:      For patients > or = 61 years of age: The upper reference limit for Creatinine is approximately 13% higher for people identified as African-American.      Creatinine, Ser  Date Value Ref Range Status  06/03/2022 0.67 0.57 - 1.00 mg/dL Final         Passed - Valid encounter within last 12 months    Recent Outpatient Visits           1 month ago Smoking greater than 20 pack years   [place], [place], MD   6 months ago [place], Charlane Ferretti, MD   7 months ago Type 2 diabetes mellitus with other neurologic complication, with long-term current use of insulin [facility])   [place], [place], [place]   9 months ago Type 2 diabetes mellitus with other neurologic complication, with long-term current use of insulin ([place])   [place], Charlane Ferretti, MD   1 year ago Type 2 diabetes mellitus with other neurologic complication, with long-term current use of insulin ([place])   [place], [place], MD       Future Appointments             In 5 months Charlott Rakes, MD [place]

## 2022-07-07 ENCOUNTER — Other Ambulatory Visit: Payer: Self-pay

## 2022-07-07 MED ORDER — DEXCOM G7 SENSOR MISC: 4 refills | Status: DC

## 2022-07-13 ENCOUNTER — Other Ambulatory Visit: Payer: Medicaid Other

## 2022-07-19 ENCOUNTER — Telehealth: Payer: Self-pay | Admitting: Nutrition

## 2022-07-19 NOTE — Telephone Encounter (Signed)
Patient canceled pump start appointment.  Left message no reason given

## 2022-07-20 ENCOUNTER — Other Ambulatory Visit: Payer: Self-pay | Admitting: Family Medicine

## 2022-07-20 DIAGNOSIS — F419 Anxiety disorder, unspecified: Secondary | ICD-10-CM

## 2022-07-20 NOTE — Telephone Encounter (Signed)
Called pharmacy to verify if Rx filled today and pharmacy staff reports Rx filled today but this will be last refill for patient until PCP renews.

## 2022-07-20 NOTE — Telephone Encounter (Signed)
Requested by interface surescripts. Last refill 07/20/22 today confirmed refill with pharmacy staff . Requested Prescriptions  Refused Prescriptions Disp Refills   FLUoxetine (PROZAC) 40 MG capsule [Pharmacy Med Name: fluoxetine 40 mg capsule] 90 capsule 1    Sig: TAKE ONE CAPSULE BY MOUTH AT BEDTIME     Psychiatry:  Antidepressants - SSRI Passed - 07/20/2022  2:35 PM      Passed - Completed PHQ-2 or PHQ-9 in the last 360 days      Passed - Valid encounter within last 6 months    Recent Outpatient Visits           1 month ago Smoking greater than 20 pack years   [place], Charlane Ferretti, MD   7 months ago [place], Charlane Ferretti, MD   7 months ago Type 2 diabetes mellitus with other neurologic complication, with long-term current use of insulin [facility])   [place], [place], [place]   10 months ago Type 2 diabetes mellitus with other neurologic complication, with long-term current use of insulin [facility])   [place], Charlane Ferretti, MD   1 year ago Type 2 diabetes mellitus with other neurologic complication, with long-term current use of insulin [facility])   [place], [place]ong, MD       Future Appointments             In 4 months Charlott Rakes, MD [place]

## 2022-07-22 ENCOUNTER — Other Ambulatory Visit: Payer: Self-pay | Admitting: Neurology

## 2022-07-22 NOTE — Telephone Encounter (Signed)
Refill is too soon

## 2022-07-30 ENCOUNTER — Other Ambulatory Visit: Payer: Self-pay | Admitting: Internal Medicine

## 2022-08-09 ENCOUNTER — Other Ambulatory Visit: Payer: Self-pay | Admitting: Family Medicine

## 2022-08-10 NOTE — Telephone Encounter (Signed)
Requested Prescriptions  Pending Prescriptions Disp Refills   cetirizine (ZYRTEC) 10 MG tablet [Pharmacy Med Name: CETIRIZINE 10MG TABLETS] 30 tablet 0    Sig: TAKE 1 TABLET(10 MG) BY MOUTH DAILY     Ear, Nose, and Throat:  Antihistamines 2 Passed - 08/09/2022 12:14 PM      Passed - Cr in normal range and within 360 days    Creat  Date Value Ref Range Status  07/05/2016 0.59 0.50 - 1.05 mg/dL Final    Comment:      For patients > or = 61 years of age: The upper reference limit for Creatinine is approximately 13% higher for people identified as African-American.      Creatinine, Ser  Date Value Ref Range Status  06/03/2022 0.67 0.57 - 1.00 mg/dL Final         Passed - Valid encounter within last 12 months    Recent Outpatient Visits           2 months ago Smoking greater than 20 pack years   [place], Enobong, MD   8 months ago [place], Charlane Ferretti, MD   8 months ago Type 2 diabetes mellitus with other neurologic complication, with long-term current use of insulin [facility])   Dent [place], Levada Dy M, [place]   11 months ago Type 2 diabetes mellitus with other neurologic complication, with long-term current use of insulin [facility])   Jarratt [place], Charlane Ferretti, MD   1 year ago Type 2 diabetes mellitus with other neurologic complication, with long-term current use of insulin [facility])   [place], Enobong, MD       Future Appointments             In 3 months Charlott Rakes, MD [place]

## 2022-08-11 ENCOUNTER — Other Ambulatory Visit: Payer: Self-pay | Admitting: Family Medicine

## 2022-08-11 ENCOUNTER — Other Ambulatory Visit: Payer: Self-pay | Admitting: Podiatry

## 2022-08-11 ENCOUNTER — Encounter: Payer: Self-pay | Admitting: Podiatry

## 2022-08-11 ENCOUNTER — Other Ambulatory Visit: Payer: Self-pay | Admitting: Neurology

## 2022-08-11 DIAGNOSIS — F419 Anxiety disorder, unspecified: Secondary | ICD-10-CM

## 2022-08-12 ENCOUNTER — Other Ambulatory Visit: Payer: Self-pay | Admitting: Family Medicine

## 2022-08-12 ENCOUNTER — Encounter: Payer: Self-pay | Admitting: Family Medicine

## 2022-08-12 DIAGNOSIS — K580 Irritable bowel syndrome with diarrhea: Secondary | ICD-10-CM

## 2022-08-13 ENCOUNTER — Other Ambulatory Visit: Payer: Self-pay | Admitting: Family Medicine

## 2022-08-13 MED ORDER — OMEPRAZOLE 40 MG PO CPDR: 40.0000 mg | DELAYED_RELEASE_CAPSULE | Freq: Every day | ORAL | 1 refills | Status: DC

## 2022-08-18 NOTE — Telephone Encounter (Signed)
Left a vm for the patient to return call.

## 2022-08-23 ENCOUNTER — Ambulatory Visit: Payer: Medicaid Other | Admitting: Neurology

## 2022-08-23 ENCOUNTER — Encounter: Payer: Self-pay | Admitting: Neurology

## 2022-08-23 VITALS — BP 136/80 | HR 75 | Ht 67.0 in | Wt 150.0 lb

## 2022-08-23 DIAGNOSIS — R519 Headache, unspecified: Secondary | ICD-10-CM

## 2022-08-23 DIAGNOSIS — R42 Dizziness and giddiness: Secondary | ICD-10-CM

## 2022-08-23 DIAGNOSIS — M792 Neuralgia and neuritis, unspecified: Secondary | ICD-10-CM | POA: Diagnosis not present

## 2022-08-23 DIAGNOSIS — R269 Unspecified abnormalities of gait and mobility: Secondary | ICD-10-CM

## 2022-08-23 MED ORDER — AMITRIPTYLINE HCL 50 MG PO TABS: 50.0000 mg | ORAL_TABLET | Freq: Every day | ORAL | 3 refills | Status: DC

## 2022-08-23 NOTE — Patient Instructions (Addendum)
Continue with Lyrica 150 mg twice daily Increase amitriptyline to 50 mg nightly Continue your other medications If symptoms not resolved after 1 month we will likely increase amitriptyline to 75 mg nightly as tolerated.  Patient is also on fluoxetine Referral to vestibular rehab for gait abnormality  Follow-up as scheduled in October.

## 2022-08-23 NOTE — Progress Notes (Signed)
[facility]  PATIENT: [name] DOB: December 06, 1960  REQUESTING CLINICIAN: Charlott Rakes, MD HISTORY FROM: Patient  REASON FOR VISIT: Headaches/Dizziness/Neuropathic pain    HISTORICAL  CHIEF COMPLAINT:  Chief Complaint  Patient presents with   Follow-up    Rm 12, alone Pt states Lyrica is no longer working  States she is taking Lyrica '150mg'$  BID    INTERVAL HISTORY 08/23/2022:  Patient presents today for follow-up, last visit was in October.  At that time plan was to continue with amitriptyline 25 mg at night and Lyrica 150 mg twice daily.  She reports now that those 2 medications are not helpful.  She still having occasional headache and neuropathic pain.  She also report increased stress related to her sister's death.  She feels that her gait is still abnormal, she sways when she walk, denies any falls.   INTERVAL HISTORY 04/20/22: Patient presents today for follow-up, last visit was in July, at that time we started her on amitriptyline for headaches preventive medication and also Lyrica for the neuropathy.  Patient reports the combination of amitriptyline and Lyrica is very beneficial, her migraines are well controlled and her neuropathy symptoms are also well controlled with the Lyrica 75/150. She also reports that her dizziness improved.  She does report increased stress, that her sister is in admitted in the ICU and then she is taking care of her and also her niece (sister son) who was diagnosed with cerebral palsy.   HISTORY OF PRESENT ILLNESS:  This is a 62 year old woman past medical history of COPD, diabetes mellitus, neuropathy, depression, hypertension and hyperlipidemia who is presenting with complaint of dizziness, that she described as room spinning sensation, can last 3 seconds up to a minute.  During this time, she feels really scared, and has nausea.  On top of that she also complained of headaches, reported daily headache, sometimes she  can wake up with headaches.  Described headache as aching pain.  In the past, she had tried Amitriptyline with good results with the headaches.  Patient also complain of neuropathic pain mostly on the left side.  She did have a shingles outbreak on the left side of her back and now in the same region she has itchiness, numbness and stabbing pain.  She reported her neuropathic pain got worse since starting her insulin and controlling her diabetes.  Currently her A1c went from 13 to almost 7.     OTHER MEDICAL CONDITIONS: COPD PAD, hypertension, hyperlipidemia, diabetes mellitus, depression and neuropathy, shingles   REVIEW OF SYSTEMS: Full 14 system review of systems performed and negative with exception of: as noted in the HPI   ALLERGIES: Allergies  Allergen Reactions   Biaxin [Clarithromycin] Anaphylaxis and Swelling   Clarithromycin Shortness Of Breath and Swelling   Lisinopril Swelling and Cough    Lip edema   Sulfa Antibiotics Anaphylaxis, Shortness Of Breath, Swelling and Hypertension   Chlorthalidone Nausea And Vomiting and Other (See Comments)    Clammy, Tachycardia, Headache    Daptomycin Nausea And Vomiting   Latex Hives and Rash   Amoxicillin-Pot Clavulanate Diarrhea    Has patient had a PCN reaction causing immediate rash, facial/tongue/throat swelling, SOB or lightheadedness with hypotension:No Has patient had a PCN reaction causing severe rash involving mucus membranes or skin necrosis:No Has patient had a PCN reaction that required hospitalization:No Has patient had a PCN reaction occurring within THE LAST 10 YEARS.  #  #  #  YES  #  #  #  If all of the above answers are "NO", then may proceed with Cephalosporin use.    Aspirin Nausea And Vomiting and Rash    On '325mg'$  dosage   Cholestyramine Nausea And Vomiting   Dilaudid [Hydromorphone Hcl] Nausea And Vomiting   Lasix [Furosemide] Nausea And Vomiting and Other (See Comments)    Headache     HOME  MEDICATIONS: Outpatient Medications Prior to Visit  Medication Sig Dispense Refill   ACCU-CHEK GUIDE test strip 1 each by Other route in the morning, at noon, in the evening, and at bedtime. Use as instructed 400 strip 3   Accu-Chek Softclix Lancets lancets Use to check blood sugar THREE TIMES DAILY 100 each 2   albuterol (VENTOLIN HFA) 108 (90 Base) MCG/ACT inhaler Inhale 2 puffs into the lungs every 4 (four) hours as needed for wheezing or shortness of breath. 18 g 2   Blood Glucose Monitoring Suppl (ACCU-CHEK GUIDE ME) w/Device KIT Use to check blood sugar TID. 1 kit 0   cetirizine (ZYRTEC) 10 MG tablet TAKE 1 TABLET(10 MG) BY MOUTH DAILY 90 tablet 2   Continuous Blood Gluc Sensor (DEXCOM G7 SENSOR) MISC Change sensors every 10 days 9 each 4   dicyclomine (BENTYL) 10 MG capsule TAKE ONE CAPSULE BY MOUTH THREE TIMES DAILY BEFORE MEALS 270 capsule 0   diphenoxylate-atropine (LOMOTIL) 2.5-0.025 MG tablet TAKE ONE TABLET BY MOUTH TWICE DAILY 60 tablet 3   ergocalciferol (DRISDOL) 1.25 MG (50000 UT) capsule Take 1 capsule (50,000 Units total) by mouth once a week. (Patient taking differently: Take 1,000 Units by mouth daily.) 12 capsule 1   fenofibrate (TRICOR) 145 MG tablet TAKE ONE TABLET BY MOUTH EVERY DAY 90 tablet 0   FLUoxetine (PROZAC) 40 MG capsule TAKE ONE CAPSULE BY MOUTH AT BEDTIME 90 capsule 0   hydrOXYzine (ATARAX) 10 MG tablet Take 1 tablet (10 mg total) by mouth 3 (three) times daily as needed. 90 tablet 2   insulin glargine (LANTUS SOLOSTAR) 100 UNIT/ML Solostar Pen Inject 24 Units into the skin daily. 30 mL 2   Insulin Pen Needle 32G X 4 MM MISC 1 Device by Does not apply route 4 (four) times daily. 400 each 2   Lancet Devices (ACCU-CHEK SOFTCLIX) lancets Use as instructed daily. 1 each 5   meclizine (ANTIVERT) 25 MG tablet Take 1 tablet (25 mg total) by mouth 2 (two) times daily as needed for dizziness. 60 tablet 3   metoprolol succinate (TOPROL-XL) 50 MG 24 hr tablet Take 1 tablet  (50 mg total) by mouth daily. 90 tablet 1   mupirocin ointment (BACTROBAN) 2 % Apply 1 Application topically 2 (two) times daily. 22 g 1   NOVOLOG FLEXPEN 100 UNIT/ML FlexPen Max Daily 50 units per correction scale 45 mL 2   omeprazole (PRILOSEC) 40 MG capsule Take 1 capsule (40 mg total) by mouth daily. 90 capsule 1   pregabalin (LYRICA) 75 MG capsule TAKE 1 CAPSULE BY MOUTH DAILY AND 2 CAPSULES AT BEDTIME     promethazine (PHENERGAN) 25 MG tablet 1/2-1 tab every 4 to 6 hours prn nausea 20 tablet 0   rosuvastatin (CRESTOR) 10 MG tablet Take 1 tablet (10 mg total) by mouth daily. 90 tablet 1   amitriptyline (ELAVIL) 25 MG tablet Take 1 tablet (25 mg total) by mouth at bedtime. 90 tablet 3   benzonatate (TESSALON) 100 MG capsule Take 1 capsule (100 mg total) by mouth 2 (two) times daily as needed for cough. 20 capsule 0  doxycycline (VIBRA-TABS) 100 MG tablet Take 1 tablet (100 mg total) by mouth 2 (two) times daily. 14 tablet 0   gabapentin (NEURONTIN) 300 MG capsule Take 600 mg by mouth 2 (two) times daily.     nicotine (NICODERM CQ) 14 mg/24hr patch Place 1 patch (14 mg total) onto the skin daily. 28 patch 1   terbinafine (LAMISIL) 250 MG tablet Take 1 tablet (250 mg total) by mouth daily. 90 tablet 0   Facility-Administered Medications Prior to Visit  Medication Dose Route Frequency Provider Last Rate Last Admin   regadenoson (LEXISCAN) injection SOLN 0.4 mg  0.4 mg Intravenous Once Dorothy Spark, MD       technetium tetrofosmin (TC-MYOVIEW) injection 0000000 millicurie  0000000 millicurie Intravenous Once PRN Dorothy Spark, MD        PAST MEDICAL HISTORY: Past Medical History:  Diagnosis Date   Anxiety    Arthritis    Chronic systolic heart failure ([place])    EF 20-25% ECHO 05/2015   COPD (chronic obstructive pulmonary disease) ([place])    Depression    Diabetes mellitus without complication ([place])    Type II   Dysrhythmia    pt unsure of name of arrythmia - " my heart rate will  drop all of a sudden" -no current treatment - atrial flutter   Fibrillation, atrial (HCC)    Gallstones    GERD (gastroesophageal reflux disease)    Headache(784.0)    migraines   Hypertension    Osteomyelitis (HCC)    R ankle   Rash    arms    PAST SURGICAL HISTORY: Past Surgical History:  Procedure Laterality Date   ANKLE FRACTURE SURGERY Right 2015   CARDIAC CATHETERIZATION N/A 06/24/2015   Procedure: Left Heart Cath and Coronary Angiography;  Surgeon: Belva Crome, MD;  Location: [facility];  Service: Cardiovascular;  Laterality: N/A;   CARPAL TUNNEL RELEASE     bil   CHOLECYSTECTOMY  11/24/2011   Procedure: LAPAROSCOPIC CHOLECYSTECTOMY WITH INTRAOPERATIVE CHOLANGIOGRAM;  Surgeon: Joyice Faster. Cornett, MD;  Location: [facility];  Service: General;  Laterality: N/A;  Laparoscopic Cholecystectomy with Cholangiogram   COLONOSCOPY     ECTOPIC PREGNANCY SURGERY  many yrs ago   HARDWARE REMOVAL Right 07/15/2015   Procedure: RIGHT ANKLE HARDWARE REMOVAL, PLACEMENT OF STIMULAN ANTIBIOTIC BEADS AND PREVENA WOUND VAC.;  Surgeon: Meredith Pel, MD;  Location: [facility];  Service: Orthopedics;  Laterality: Right;   TOOTH EXTRACTION N/A 12/03/2016   Procedure: DENTAL EXTRACTIONS with Alveoloplasty;  Surgeon: Diona Browner, DDS;  Location: [facility];  Service: Oral Surgery;  Laterality: N/A;    FAMILY HISTORY: Family History  Problem Relation Age of Onset   Diabetes Mother    Lung cancer Father    Heart attack Father 74       CABG x4   Breast cancer Sister        Survivor     SOCIAL HISTORY: Social History   Socioeconomic History   Marital status: Divorced    Spouse name: Not on file   Number of children: Not on file   Years of education: Not on file   Highest education level: Not on file  Occupational History   Not on file  Tobacco Use   Smoking status: Every Day    Packs/day: 1.00    Years: 20.00    Total pack years: 20.00    Types: Cigarettes, E-cigarettes    Start  date: 09/19/2016   Smokeless tobacco:  Never  Vaping Use   Vaping Use: Every day   Start date: 10/12/2016  Substance and Sexual Activity   Alcohol use: No    Alcohol/week: 0.0 standard drinks of alcohol   Drug use: No   Sexual activity: Not Currently    Birth control/protection: Post-menopausal  Other Topics Concern   Not on file  Social History Narrative   Not on file   Social Determinants of Health   Financial Resource Strain: Not on file  Food Insecurity: Not on file  Transportation Needs: Not on file  Physical Activity: Not on file  Stress: Not on file  Social Connections: Not on file  Intimate Partner Violence: Not on file    PHYSICAL EXAM  GENERAL EXAM/CONSTITUTIONAL: Vitals:  Vitals:   08/23/22 1135  BP: 136/80  Pulse: 75  Weight: 150 lb (68 kg)  Height: '5\' 7"'$  (1.702 m)   Body mass index is 23.49 kg/m. Wt Readings from Last 3 Encounters:  08/23/22 150 lb (68 kg)  06/02/22 150 lb (68 kg)  04/20/22 149 lb (67.6 kg)   Patient is in no distress; well developed, nourished and groomed; neck is supple  EYES: Visual fields full to confrontation, Extraocular movements intacts,   MUSCULOSKELETAL: Gait, strength, tone, movements noted in Neurologic exam below  NEUROLOGIC: MENTAL STATUS:      No data to display         awake, alert, oriented to person, place and time recent and remote memory intact normal attention and concentration language fluent, comprehension intact, naming intact fund of knowledge appropriate  CRANIAL NERVE:  2nd, 3rd, 4th, 6th - visual fields full to confrontation, extraocular muscles intact, no nystagmus 5th - facial sensation symmetric 7th - facial strength symmetric 8th - hearing intact 9th - palate elevates symmetrically, uvula midline 11th - shoulder shrug symmetric 12th - tongue protrusion midline  MOTOR:  normal bulk and tone, full strength in the BUE, BLE  SENSORY:  normal and symmetric to light  touch  COORDINATION:  finger-nose-finger, fine finger movements normal  REFLEXES:  deep tendon reflexes present and symmetric  GAIT/STATION:  Slow wide based, unable to tandem, mild romberg   DIAGNOSTIC DATA (LABS, IMAGING, TESTING) - I reviewed patient records, labs, notes, testing and imaging myself where available.  Lab Results  Component Value Date   WBC 9.4 04/26/2022   HGB 14.1 04/26/2022   HCT 42.7 04/26/2022   MCV 87.3 04/26/2022   PLT 164 04/26/2022      Component Value Date/Time   NA 139 06/03/2022 0840   K 4.2 06/03/2022 0840   CL 102 06/03/2022 0840   CO2 23 06/03/2022 0840   GLUCOSE 168 (H) 06/03/2022 0840   GLUCOSE 174 (H) 11/05/2021 1547   BUN 8 06/03/2022 0840   CREATININE 0.67 06/03/2022 0840   CREATININE 0.59 07/05/2016 0947   CALCIUM 9.3 06/03/2022 0840   PROT 7.4 06/03/2022 0840   ALBUMIN 4.2 06/03/2022 0840   AST 10 06/03/2022 0840   ALT 8 06/03/2022 0840   ALKPHOS 88 06/03/2022 0840   BILITOT 0.3 06/03/2022 0840   GFRNONAA >60 11/05/2021 1547   GFRNONAA >89 07/05/2016 0947   GFRAA 114 07/11/2020 1002   GFRAA >89 07/05/2016 0947   Lab Results  Component Value Date   CHOL 142 06/03/2022   HDL 43 06/03/2022   LDLCALC 82 06/03/2022   TRIG 87 06/03/2022   CHOLHDL 5.6 (H) 07/31/2019   Lab Results  Component Value Date   HGBA1C 8.4 (H) 06/03/2022  No results found for: "VITAMINB12" Lab Results  Component Value Date   TSH 0.997 03/09/2021    MRI Brain 01/31/22 Scattered T2/FLAIR hyperintense foci in the hemispheres and cerebellum consistent with mild chronic microvascular ischemic changes or remote ischemic events.  These are stable compared to the 2016 MRI. Bilateral mastoid effusions.  These are usually caused by eustachian tube dysfunction. No acute findings   CT Temporal 03/05/2022 Mild mastoid opacification with negative nasopharynx, seen to some degree on scans over multiple years.   ASSESSMENT AND PLAN  62 y.o. year old  female with medical conditions including hypertension, hyperlipidemia, diabetes mellitus, COPD and postherpetic neuralgia who is presenting for follow up for her headaches and neuropathy.  She feels now that the Lyrica and amitriptyline are not helpful as previously.  Will increase the amitriptyline to 50 mg at night and keep the Lyrica 150 twice daily.  In a month, if her symptoms are not improved, will most likely further increase amitriptyline to 75 mg if no side effects.  Patient is still also on Fluoxetine. For her gait abnormality, will refer her to vestibular therapy. Follow up as scheduled in October.    1. Nonintractable headache, unspecified chronicity pattern, unspecified headache type   2. Neuropathic pain   3. Dizziness   4. Gait abnormality       Patient Instructions  Continue with Lyrica 150 mg twice daily Increase amitriptyline to 50 mg nightly Continue your other medications If symptoms not resolved after 1 month we will likely increase amitriptyline to 75 mg nightly as tolerated.  Patient is also on fluoxetine Referral to vestibular rehab for gait abnormality  Follow-up as scheduled in October.  Orders Placed This Encounter  Procedures   PT vestibular rehab    Meds ordered this encounter  Medications   amitriptyline (ELAVIL) 50 MG tablet    Sig: Take 1 tablet (50 mg total) by mouth at bedtime.    Dispense:  90 tablet    Refill:  3    Return in about 8 months (around 04/21/2023).  I have spent a total of 30 minutes dedicated to this patient today, preparing to see patient, performing a medically appropriate examination and evaluation, ordering tests and/or medications and procedures, and counseling and educating the patient/family/caregiver; independently interpreting result and communicating results to the family/patient/caregiver; and documenting clinical information in the electronic medical record.   Alric Ran, MD 08/23/2022, 1:16 PM  [facility] [address], [place], [place] [address] [phone]

## 2022-08-24 ENCOUNTER — Other Ambulatory Visit: Payer: Self-pay | Admitting: Podiatry

## 2022-08-24 ENCOUNTER — Other Ambulatory Visit: Payer: Self-pay | Admitting: Neurology

## 2022-08-29 ENCOUNTER — Other Ambulatory Visit: Payer: Self-pay | Admitting: Internal Medicine

## 2022-09-09 ENCOUNTER — Other Ambulatory Visit: Payer: Self-pay | Admitting: Neurology

## 2022-09-20 ENCOUNTER — Other Ambulatory Visit: Payer: Self-pay | Admitting: Family Medicine

## 2022-09-20 DIAGNOSIS — F419 Anxiety disorder, unspecified: Secondary | ICD-10-CM

## 2022-10-11 ENCOUNTER — Ambulatory Visit: Payer: Medicaid Other | Admitting: Internal Medicine

## 2022-10-11 NOTE — Progress Notes (Deleted)
Name: [name]  MRN/ DOB: [id number], July 05, 1960   Age/ Sex: 62 y.o., female    PCP: Hoy Register, MD   Reason for Endocrinology Evaluation: Type 2 Diabetes Mellitus     Date of Initial Endocrinology Visit: 12/19/2020    PATIENT IDENTIFIER: [name] is a 62 y.o. female with a past medical history of T2DM, HTN, COPD  , retinitis pigmentosa ,and CHF. The patient presented for initial endocrinology clinic visit on 12/19/2020 for consultative assistance with her diabetes management.    HPI: [name] was    Diagnosed with DM 2017 Prior Medications tried/Intolerance: Metformin - diarrhea, Trulicity - nausea                  Hemoglobin A1c has ranged from 9.0% in 2018, peaking at 13.4% in 2022.  On her initial visit to our clinic her A1c was 11.9% , she was on insulin mix and Glimepiride , which we stopped and started her on Trulicity and Lantus    SUBJECTIVE:   During the last visit (04/09/2022): A1c 8.3 %    Today (10/11/22): [name] is here for a follow up on diabetes management. She is accompanied by her daughter today . She checks her blood sugars 4-5  times daily through CGM. The patient has not had hypoglycemic episodes since the last clinic visit, occur sporadically at night    She was seen by podiatry 03/2022    HOME DIABETES REGIMEN: Novolog 12 units TIDQAC Lantus 24 units daily CF: Novolog (BG -130/30)     Statin: yes ACE-I/ARB: Allergic to lisinopril - swelling  Prior Diabetic Education: no    CONTINUOUS GLUCOSE MONITORING RECORD INTERPRETATION    Dates of Recording: 9/30 - 04/09/2022  Sensor description: dexcom   Results statistics:   CGM use % of time 79  Average and SD 204/56  Time in range    36%  % Time Above 180 43  % Time above 250 20  % Time Below target <1     Glycemic patterns summary: Bg's optimal overnight, hyperglycemia noted postprandial   Hyperglycemic episodes postprandial  Hypoglycemic  episodes and a over the past 2 weeks  Overnight periods: stable        DIABETIC COMPLICATIONS: Microvascular complications:  Neuropathy Denies: CKD,retinopathy  Last eye exam: 12/2020  Macrovascular complications:  CHF  Denies: CAD, PVD, CVA   PAST HISTORY: Past Medical History:  Past Medical History:  Diagnosis Date   Anxiety    Arthritis    Chronic systolic heart failure (HCC)    EF 20-25% ECHO 05/2015   COPD (chronic obstructive pulmonary disease) (HCC)    Depression    Diabetes mellitus without complication (HCC)    Type II   Dysrhythmia    pt unsure of name of arrythmia - " my heart rate will drop all of a sudden" -no current treatment - atrial flutter   Fibrillation, atrial (HCC)    Gallstones    GERD (gastroesophageal reflux disease)    Headache(784.0)    migraines   Hypertension    Osteomyelitis (HCC)    R ankle   Rash    arms   Past Surgical History:  Past Surgical History:  Procedure Laterality Date   ANKLE FRACTURE SURGERY Right 2015   CARDIAC CATHETERIZATION N/A 06/24/2015   Procedure: Left Heart Cath and Coronary Angiography;  Surgeon: [name], MD;  Location: [facility] INVASIVE CV LAB;  Service: Cardiovascular;  Laterality: N/A;   CARPAL TUNNEL  RELEASE     bil   CHOLECYSTECTOMY  11/24/2011   Procedure: LAPAROSCOPIC CHOLECYSTECTOMY WITH INTRAOPERATIVE CHOLANGIOGRAM;  Surgeon: Clovis Pu. Cornett, MD;  Location: [facility] ORS;  Service: General;  Laterality: N/A;  Laparoscopic Cholecystectomy with Cholangiogram   COLONOSCOPY     ECTOPIC PREGNANCY SURGERY  many yrs ago   HARDWARE REMOVAL Right 07/15/2015   Procedure: RIGHT ANKLE HARDWARE REMOVAL, PLACEMENT OF STIMULAN ANTIBIOTIC BEADS AND PREVENA WOUND VAC.;  Surgeon: [name], MD;  Location: [facility] OR;  Service: Orthopedics;  Laterality: Right;   TOOTH EXTRACTION N/A 12/03/2016   Procedure: DENTAL EXTRACTIONS with Alveoloplasty;  Surgeon: Ocie Doyne, DDS;  Location: [facility] OR;  Service: Oral Surgery;   Laterality: N/A;    Social History:  reports that she has been smoking cigarettes and e-cigarettes. She started smoking about 6 years ago. She has a 20.00 pack-year smoking history. She has never used smokeless tobacco. She reports that she does not drink alcohol and does not use drugs. Family History:  Family History  Problem Relation Age of Onset   Diabetes Mother    Lung cancer Father    Heart attack Father 19       CABG x4   Breast cancer Sister        Survivor      HOME MEDICATIONS: Allergies as of 10/11/2022       Reactions   Biaxin [clarithromycin] Anaphylaxis, Swelling   Clarithromycin Shortness Of Breath, Swelling   Lisinopril Swelling, Cough   Lip edema   Sulfa Antibiotics Anaphylaxis, Shortness Of Breath, Swelling, Hypertension   Chlorthalidone Nausea And Vomiting, Other (See Comments)   Clammy, Tachycardia, Headache   Daptomycin Nausea And Vomiting   Latex Hives, Rash   Amoxicillin-pot Clavulanate Diarrhea   Has patient had a PCN reaction causing immediate rash, facial/tongue/throat swelling, SOB or lightheadedness with hypotension:No Has patient had a PCN reaction causing severe rash involving mucus membranes or skin necrosis:No Has patient had a PCN reaction that required hospitalization:No Has patient had a PCN reaction occurring within THE LAST 10 YEARS.  #  #  #  YES  #  #  #  If all of the above answers are "NO", then may proceed with Cephalosporin use.   Aspirin Nausea And Vomiting, Rash   On  dosage   Cholestyramine Nausea And Vomiting   Dilaudid [hydromorphone Hcl] Nausea And Vomiting   Lasix [furosemide] Nausea And Vomiting, Other (See Comments)   Headache         Medication List        Accurate as of October 11, 2022 10:21 AM. If you have any questions, ask your nurse or doctor.          Accu-Chek Guide Me w/Device Kit Use to check blood sugar TID.   Accu-Chek Guide test strip Generic drug: glucose blood USE AS DIRECTED IN THE  MORNING, AT NOON, IN THE EVENING AND AT BEDTIME   accu-chek softclix lancets Use as instructed daily.   Accu-Chek Softclix Lancets lancets Use to check blood sugar THREE TIMES DAILY   albuterol 108 (90 Base) MCG/ACT inhaler Commonly known as: Ventolin HFA Inhale 2 puffs into the lungs every 4 (four) hours as needed for wheezing or shortness of breath.   amitriptyline 50 MG tablet Commonly known as: ELAVIL Take 1 tablet (50 mg total) by mouth at bedtime.   cetirizine 10 MG tablet Commonly known as: ZYRTEC TAKE 1 TABLET(10 MG) BY MOUTH DAILY   Dexcom G7 Sensor Misc Change sensors  every 10 days   dicyclomine 10 MG capsule Commonly known as: BENTYL TAKE ONE CAPSULE BY MOUTH THREE TIMES DAILY BEFORE MEALS   diphenoxylate-atropine 2.5-0.025 MG tablet Commonly known as: LOMOTIL TAKE ONE TABLET BY MOUTH TWICE DAILY   ergocalciferol 1.25 MG (50000 UT) capsule Commonly known as: Drisdol Take 1 capsule (50,000 Units total) by mouth once a week. What changed:  how much to take when to take this   fenofibrate 145 MG tablet Commonly known as: TRICOR TAKE ONE TABLET BY MOUTH EVERY DAY   FLUoxetine 40 MG capsule Commonly known as: PROZAC TAKE ONE CAPSULE BY MOUTH AT BEDTIME   hydrOXYzine 10 MG tablet Commonly known as: ATARAX TAKE ONE TABLET BY MOUTH THREE TIMES DAILY AS NEEDED   Insulin Pen Needle 32G X 4 MM Misc 1 Device by Does not apply route 4 (four) times daily.   Lantus SoloStar 100 UNIT/ML Solostar Pen Generic drug: insulin glargine Inject 24 Units into the skin daily.   meclizine 25 MG tablet Commonly known as: ANTIVERT Take 1 tablet (25 mg total) by mouth 2 (two) times daily as needed for dizziness.   metoprolol succinate 50 MG 24 hr tablet Commonly known as: TOPROL-XL Take 1 tablet (50 mg total) by mouth daily.   mupirocin ointment 2 % Commonly known as: BACTROBAN Apply 1 Application topically 2 (two) times daily.   NovoLOG FlexPen 100 UNIT/ML  FlexPen Generic drug: insulin aspart Max Daily 50 units per correction scale   omeprazole 40 MG capsule Commonly known as: PRILOSEC Take 1 capsule (40 mg total) by mouth daily.   pregabalin 150 MG capsule Commonly known as: LYRICA TAKE 1 CAPSULE(150 MG) BY MOUTH TWICE DAILY   promethazine 25 MG tablet Commonly known as: PHENERGAN 1/2-1 tab every 4 to 6 hours prn nausea   rosuvastatin 10 MG tablet Commonly known as: CRESTOR Take 1 tablet (10 mg total) by mouth daily.         ALLERGIES: Allergies  Allergen Reactions   Biaxin [Clarithromycin] Anaphylaxis and Swelling   Clarithromycin Shortness Of Breath and Swelling   Lisinopril Swelling and Cough    Lip edema   Sulfa Antibiotics Anaphylaxis, Shortness Of Breath, Swelling and Hypertension   Chlorthalidone Nausea And Vomiting and Other (See Comments)    Clammy, Tachycardia, Headache    Daptomycin Nausea And Vomiting   Latex Hives and Rash   Amoxicillin-Pot Clavulanate Diarrhea    Has patient had a PCN reaction causing immediate rash, facial/tongue/throat swelling, SOB or lightheadedness with hypotension:No Has patient had a PCN reaction causing severe rash involving mucus membranes or skin necrosis:No Has patient had a PCN reaction that required hospitalization:No Has patient had a PCN reaction occurring within THE LAST 10 YEARS.  #  #  #  YES  #  #  #  If all of the above answers are "NO", then may proceed with Cephalosporin use.    Aspirin Nausea And Vomiting and Rash    On 325mg  dosage   Cholestyramine Nausea And Vomiting   Dilaudid [Hydromorphone Hcl] Nausea And Vomiting   Lasix [Furosemide] Nausea And Vomiting and Other (See Comments)    Headache        OBJECTIVE:   VITAL SIGNS: LMP 06/09/2011 Comment: verified BEFORE imaging   PHYSICAL EXAM:  General: Pt appears well and is in NAD  Lungs: Scattered rhonchi  Heart: RRR  Abdomen: soft, nontender  Extremities:  Lower extremities - No pretibial edema.   Neuro: MS is good with appropriate affect,  pt is alert and Ox3   DM foot exam 04/09/2022  The skin of the feet is intact without sores or ulcerations. The pedal pulses are 2+ on right and 2+ on left. The sensation is intact to a screening 5.07, 10 gram monofilament bilaterally   DATA REVIEWED:  Lab Results  Component Value Date   HGBA1C 8.4 (H) 06/03/2022   HGBA1C 8.3 (A) 04/09/2022   HGBA1C 9.2 (A) 10/08/2021   Lab Results  Component Value Date   LDLCALC 82 06/03/2022   CREATININE 0.67 06/03/2022   Lab Results  Component Value Date   MICRALBCREAT 11 06/03/2022    Lab Results  Component Value Date   CHOL 142 06/03/2022   HDL 43 06/03/2022   LDLCALC 82 06/03/2022   TRIG 87 06/03/2022   CHOLHDL 5.6 (H) 07/31/2019        ASSESSMENT / PLAN / RECOMMENDATIONS:   1) Type 2 Diabetes Mellitus, Poorly controlled, With neuropathic  complications - Most recent A1c of 8.3 %. Goal A1c < 7.0 %.    -I have praised the patient on continued glycemic improvement, A1c down from 9.2% to 8.3% -Intolerant to Trulicity 0.75 mg weekly -I prescribed OmniPod for her, but the patient tells me this was stolen, I will refill this again, I will refer her to our CDE for training -She was also provided with a pamphlet to train her pump prior to training -In review on her CGM data, patient has been noted with postprandial hyperglycemia, will increase NovoLog as below  MEDICATIONS: Continue Lantus 24 units once daily  Increase NovoLog  12 units 3 times daily before every meal Continue correction factor: NovoLog (BG -130/30)   EDUCATION / INSTRUCTIONS: BG monitoring instructions: Patient is instructed to check her blood sugars 1 times a day, fasting. Call [place] if: BG persistently < 70  I reviewed the Rule of 15 for the treatment of hypoglycemia in detail with the patient. Literature supplied.   2) Diabetic complications:  Eye: Does not have known diabetic retinopathy.   Neuro/ Feet: Does  have known diabetic peripheral neuropathy. Renal: Patient does not have known baseline CKD. She is not on an ACEI/ARB at present.    F/U in 4 months    Signed electronically by: Lyndle Herrlich, MD  [facility] Endocrinology  [facility] [address] [place], [place] [address] Phone: [phone] FAX: [phone]   CC: Hoy Register, MD [address] [place] [address] Phone: [phone]  Fax: [phone]    Return to Endocrinology clinic as below: Future Appointments  Date Time Provider Department Center  10/11/2022  1:40 PM Thekla Colborn, Konrad Dolores, MD [id number] None  12/02/2022  9:10 AM Hoy Register, MD [facility] None  04/21/2023  1:45 PM Windell Norfolk, MD [facility] None

## 2022-10-14 ENCOUNTER — Encounter: Payer: Self-pay | Admitting: Neurology

## 2022-10-18 ENCOUNTER — Other Ambulatory Visit: Payer: Self-pay | Admitting: Family Medicine

## 2022-10-18 ENCOUNTER — Other Ambulatory Visit: Payer: Self-pay | Admitting: Neurology

## 2022-10-18 DIAGNOSIS — K580 Irritable bowel syndrome with diarrhea: Secondary | ICD-10-CM

## 2022-10-18 NOTE — Telephone Encounter (Signed)
Order for PT  was entered on 2/26. It is under order  review.

## 2022-10-19 NOTE — Telephone Encounter (Signed)
Can referral be re ordered please? I see it in her OV note but not under the actual referral orders.

## 2022-10-20 ENCOUNTER — Other Ambulatory Visit: Payer: Self-pay | Admitting: Family Medicine

## 2022-10-20 DIAGNOSIS — K580 Irritable bowel syndrome with diarrhea: Secondary | ICD-10-CM

## 2022-10-20 DIAGNOSIS — H8113 Benign paroxysmal vertigo, bilateral: Secondary | ICD-10-CM

## 2022-10-21 ENCOUNTER — Encounter: Payer: Self-pay | Admitting: Family Medicine

## 2022-10-21 ENCOUNTER — Other Ambulatory Visit: Payer: Self-pay | Admitting: Internal Medicine

## 2022-10-21 ENCOUNTER — Encounter: Payer: Self-pay | Admitting: Internal Medicine

## 2022-10-21 ENCOUNTER — Other Ambulatory Visit: Payer: Self-pay | Admitting: Family Medicine

## 2022-10-21 DIAGNOSIS — F419 Anxiety disorder, unspecified: Secondary | ICD-10-CM

## 2022-10-21 NOTE — Telephone Encounter (Signed)
Requested medication (s) are due for refill today: yes  Requested medication (s) are on the active medication list: yes  Last refill:  06/02/22  Future visit scheduled: yes  Notes to clinic:  Unable to refill per protocol, cannot delegate.      Requested Prescriptions  Pending Prescriptions Disp Refills   meclizine (ANTIVERT) 25 MG tablet [Pharmacy Med Name: MECLIZINE  RX TABLETS] 180 tablet     Sig: TAKE 1 TABLET(25 MG) BY MOUTH TWICE DAILY AS NEEDED FOR DIZZINESS     Not Delegated - Gastroenterology: Antiemetics Failed - 10/20/2022  7:22 PM      Failed - This refill cannot be delegated      Passed - Valid encounter within last 6 months    Recent Outpatient Visits           4 months ago Smoking greater than 20 pack years   [facility] [place], Odette Horns, MD   10 months ago Dizziness   [place] [facility] [place], Odette Horns, MD   11 months ago Type 2 diabetes mellitus with other neurologic complication, with long-term current use of insulin [facility])   [place] [facility] [place], [place], [place]   1 year ago Type 2 diabetes mellitus with other neurologic complication, with long-term current use of insulin (HCC)   [place] [facility] [employer], [place], [place]   1 year ago Type 2 diabetes mellitus with other neurologic complication, with long-term current use of insulin (HCC)   [place] [facility] Hoy Register, MD       Future Appointments             In 1 month Hoy Register, MD [facility]             diphenoxylate-atropine (LOMOTIL) 2.5-0.025 MG tablet [Pharmacy Med Name: DIPHENOXYLATE/ATROPINE 2.5MG  TABS] 180 tablet     Sig: TAKE 1 TABLET BY MOUTH TWICE DAILY     Not Delegated - Gastroenterology:  Antidiarrheals Failed - 10/20/2022  7:22 PM      Failed - This refill cannot be delegated       Passed - Valid encounter within last 12 months    Recent Outpatient Visits           4 months ago Smoking greater than 20 pack years   [place] [facility] Hoy Register, MD   10 months ago Dizziness   [place] [facility] [place], Odette Horns, MD   11 months ago Type 2 diabetes mellitus with other neurologic complication, with long-term current use of insulin [facility])   [place] [facility] [place], [place], [place]   1 year ago Type 2 diabetes mellitus with other neurologic complication, with long-term current use of insulin (HCC)   [place] [facility] [place], Odette Horns, MD   1 year ago Type 2 diabetes mellitus with other neurologic complication, with long-term current use of insulin [facility])   [place] [facility] Hoy Register, MD       Future Appointments             In 1 month Hoy Register, MD [facility]

## 2022-10-22 ENCOUNTER — Other Ambulatory Visit: Payer: Self-pay | Admitting: Internal Medicine

## 2022-10-22 ENCOUNTER — Other Ambulatory Visit: Payer: Self-pay | Admitting: Family Medicine

## 2022-10-22 MED ORDER — FLUCONAZOLE 150 MG PO TABS: 150.0000 mg | ORAL_TABLET | Freq: Once | ORAL | 0 refills | Status: AC

## 2022-10-22 NOTE — Telephone Encounter (Signed)
Requested Prescriptions  Pending Prescriptions Disp Refills   hydrOXYzine (ATARAX) 10 MG tablet [Pharmacy Med Name: hydroxyzine HCl 10 mg tablet] 90 tablet 0    Sig: TAKE ONE TABLET BY MOUTH THREE TIMES DAILY AS NEEDED     Ear, Nose, and Throat:  Antihistamines 2 Passed - 10/21/2022  5:41 PM      Passed - Cr in normal range and within 360 days    Creat  Date Value Ref Range Status  07/05/2016 0.59 0.50 - 1.05 mg/dL Final    Comment:      For patients > or = 61 years of age: The upper reference limit for Creatinine is approximately 13% higher for people identified as African-American.      Creatinine, Ser  Date Value Ref Range Status  06/03/2022 0.67 0.57 - 1.00 mg/dL Final         Passed - Valid encounter within last 12 months    Recent Outpatient Visits           4 months ago Smoking greater than 20 pack years   [place] [facility] Hoy Register, MD   10 months ago Dizziness   [place] [facility], Odette Horns, MD   11 months ago Type 2 diabetes mellitus with other neurologic complication, with long-term current use of insulin [facility])   [place] [facility] [place], [place], [place]   1 year ago Type 2 diabetes mellitus with other neurologic complication, with long-term current use of insulin [facility])   [place] [facility] [place], Odette Horns, MD   1 year ago Type 2 diabetes mellitus with other neurologic complication, with long-term current use of insulin [facility])   [place] [facility] Hoy Register, MD       Future Appointments             In 1 month Hoy Register, MD [facility]

## 2022-10-25 ENCOUNTER — Other Ambulatory Visit: Payer: Self-pay | Admitting: Neurology

## 2022-10-25 DIAGNOSIS — R42 Dizziness and giddiness: Secondary | ICD-10-CM

## 2022-10-25 DIAGNOSIS — R269 Unspecified abnormalities of gait and mobility: Secondary | ICD-10-CM

## 2022-10-25 NOTE — Telephone Encounter (Signed)
Done. Thanks.

## 2022-10-26 ENCOUNTER — Other Ambulatory Visit: Payer: Self-pay

## 2022-10-26 ENCOUNTER — Other Ambulatory Visit: Payer: Self-pay | Admitting: Family Medicine

## 2022-10-26 DIAGNOSIS — Z0000 Encounter for general adult medical examination without abnormal findings: Secondary | ICD-10-CM

## 2022-10-26 MED ORDER — DEXCOM G6 RECEIVER DEVI: 0 refills | Status: DC

## 2022-10-27 ENCOUNTER — Ambulatory Visit
Admission: RE | Admit: 2022-10-27 | Discharge: 2022-10-27 | Disposition: A | Discharge status: Home / Self Care | Payer: Medicaid Other | Source: Ambulatory Visit | Attending: Family Medicine | Admitting: Family Medicine

## 2022-10-27 ENCOUNTER — Telehealth: Payer: Self-pay

## 2022-10-27 DIAGNOSIS — Z0000 Encounter for general adult medical examination without abnormal findings: Secondary | ICD-10-CM

## 2022-10-27 NOTE — Telephone Encounter (Signed)
Patient Advocate Encounter   Received notification from FAX that prior authorization is required for Dexcom G6 receiver   Submitted: 10/27/22 Key [id number]  Status is pending

## 2022-10-27 NOTE — Telephone Encounter (Signed)
Pharmacy Patient Advocate Encounter  Prior Authorization for Dexcom G6 Sensor has been approved    Effective dates: 10/27/2022 through 04/29/2023

## 2022-10-27 NOTE — Telephone Encounter (Signed)
Patient Advocate Encounter   Received notification from FAX that prior authorization is required for Dexcom G6 Sensor  Submitted: 10/27/22 Key [id number]  Status is pending

## 2022-10-27 NOTE — Telephone Encounter (Signed)
Pharmacy Patient Advocate Encounter  Prior Authorization for Dexcom G6 receiver has been approved  Effective dates: 10/27/2022 through 04/29/2023

## 2022-10-28 ENCOUNTER — Other Ambulatory Visit: Payer: Self-pay | Admitting: Neurology

## 2022-10-28 NOTE — Telephone Encounter (Signed)
Per 08/23/22 note "Will increase the amitriptyline to 50 mg at night and keep the Lyrica 150 twice daily. "  Rx denied

## 2022-10-29 ENCOUNTER — Encounter: Payer: Medicaid Other | Admitting: Physical Therapy

## 2022-10-30 ENCOUNTER — Other Ambulatory Visit: Payer: Self-pay | Admitting: Family Medicine

## 2022-10-30 DIAGNOSIS — F419 Anxiety disorder, unspecified: Secondary | ICD-10-CM

## 2022-11-01 ENCOUNTER — Ambulatory Visit: Payer: Medicaid Other | Attending: Neurology | Admitting: Physical Therapy

## 2022-11-01 ENCOUNTER — Encounter: Payer: Self-pay | Admitting: Physical Therapy

## 2022-11-01 VITALS — BP 139/89 | HR 97

## 2022-11-01 DIAGNOSIS — R42 Dizziness and giddiness: Secondary | ICD-10-CM | POA: Diagnosis present

## 2022-11-01 DIAGNOSIS — R2681 Unsteadiness on feet: Secondary | ICD-10-CM | POA: Diagnosis present

## 2022-11-01 DIAGNOSIS — R269 Unspecified abnormalities of gait and mobility: Secondary | ICD-10-CM | POA: Diagnosis present

## 2022-11-01 NOTE — Therapy (Signed)
OUTPATIENT PHYSICAL THERAPY VESTIBULAR EVALUATION   Patient Name: [name] MRN: [id number] DOB:[birth year], 62 y.o., female Today's Date: 11/01/2022  END OF SESSION:  PT End of Session - 11/01/22 1111     Visit Number 1    Number of Visits 1    Authorization Type [employer]    PT Start Time 1109    PT Stop Time 1155    PT Time Calculation (min) 46 min    Equipment Utilized During Treatment Gait belt    Activity Tolerance Patient tolerated treatment well    Behavior During Therapy WFL for tasks assessed/performed             Past Medical History:  Diagnosis Date   Anxiety    Arthritis    Chronic systolic heart failure (HCC)    EF 20-25% ECHO 05/2015   COPD (chronic obstructive pulmonary disease) (HCC)    Depression    Diabetes mellitus without complication (HCC)    Type II   Dysrhythmia    pt unsure of name of arrythmia - " my heart rate will drop all of a sudden" -no current treatment - atrial flutter   Fibrillation, atrial (HCC)    Gallstones    GERD (gastroesophageal reflux disease)    Headache(784.0)    migraines   Hypertension    Osteomyelitis (HCC)    R ankle   Rash    arms   Past Surgical History:  Procedure Laterality Date   ANKLE FRACTURE SURGERY Right 2015   CARDIAC CATHETERIZATION N/A 06/24/2015   Procedure: Left Heart Cath and Coronary Angiography;  Surgeon: [name], MD;  Location: [facility] INVASIVE CV LAB;  Service: Cardiovascular;  Laterality: N/A;   CARPAL TUNNEL RELEASE     bil   CHOLECYSTECTOMY  11/24/2011   Procedure: LAPAROSCOPIC CHOLECYSTECTOMY WITH INTRAOPERATIVE CHOLANGIOGRAM;  Surgeon: Clovis Pu. Cornett, MD;  Location: [facility] ORS;  Service: General;  Laterality: N/A;  Laparoscopic Cholecystectomy with Cholangiogram   COLONOSCOPY     ECTOPIC PREGNANCY SURGERY  many yrs ago   HARDWARE REMOVAL Right 07/15/2015   Procedure: RIGHT ANKLE HARDWARE REMOVAL, PLACEMENT OF STIMULAN ANTIBIOTIC BEADS AND PREVENA WOUND VAC.;  Surgeon: [name], MD;  Location: [facility] OR;  Service: Orthopedics;  Laterality: Right;   TOOTH EXTRACTION N/A 12/03/2016   Procedure: DENTAL EXTRACTIONS with Alveoloplasty;  Surgeon: Ocie Doyne, DDS;  Location: [facility] OR;  Service: Oral Surgery;  Laterality: N/A;   Patient Active Problem List   Diagnosis Date Noted   Allergic rhinitis 04/23/2022   Amblyopia 04/23/2022   Combined fat and carbohydrate induced hyperlipemia 04/23/2022   Compulsive tobacco user syndrome 04/23/2022   D (diarrhea) 04/23/2022   Diabetic retinopathy associated with type 2 diabetes mellitus (HCC) 04/23/2022   Idiopathic osteoarthritis 04/23/2022   Type 2 diabetes mellitus with hyperglycemia, with long-term current use of insulin (HCC) 12/21/2020   Candida vaginitis 12/21/2020   Type 2 diabetes mellitus with diabetic polyneuropathy, with long-term current use of insulin (HCC) 12/21/2020   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/18/2016   Primary osteoarthritis of both hands 10/06/2016   History of gastroesophageal reflux (GERD) 10/06/2016   History of COPD 10/06/2016   History of anxiety 10/06/2016   History of CHF (congestive heart failure) 10/06/2016   History of hypertension 10/06/2016   History of cardiac dysrhythmia 10/06/2016   Primary osteoarthritis of both feet 10/06/2016   Primary osteoarthritis of both knees 10/06/2016   History of infection/ ankle post operative  10/06/2016   Rheumatoid factor  positive 10/06/2016   De Quervain's disease (radial styloid tenosynovitis) 06/09/2016   Tobacco abuse 05/23/2016   Hyperglycemia without ketosis    Bleeding hemorrhoids 11/03/2015   Acute blood loss anemia 11/03/2015   Hematochezia 11/01/2015   Back pain with sciatica 09/08/2015   Infection of bone of ankle (HCC) 07/15/2015   Chronic systolic CHF (congestive heart failure) (HCC) 06/24/2015   Chest pain 06/03/2015   Hypertension    Anxiety    Gallstones    GERD (gastroesophageal reflux disease)    COPD (chronic obstructive pulmonary  disease) (HCC)    Dysrhythmia    Rash    Depression    Diabetes mellitus without complication (HCC)    Fungal infection of nail 05/14/2015   Fuchs' corneal dystrophy 09/05/2012   Retinitis pigmentosa, both eyes 09/05/2012   Post-operative state 12/10/2011    PCP: Hoy Register, MD REFERRING PROVIDER: Windell Norfolk, MD  REFERRING DIAG: R26.9 (ICD-10-CM) - Gait abnormality R42 (ICD-10-CM) - Dizziness  THERAPY DIAG:  Dizziness and giddiness  Abnormality of gait and mobility  Unsteadiness on feet  ONSET DATE: 10/25/2022 (referral date)  Rationale for Evaluation and Treatment: Rehabilitation  SUBJECTIVE:   SUBJECTIVE STATEMENT: Patient states an onset of symptoms over the last 3 years. Patient reports that when she tries to to lift her heads up over her arm, she feels as though she is going to pass out (though she does not) and gets extremely dizzy, tunnel visioned, and has to sit down. Patient also reports that when she is walking, she feels very off balance and "it looks like I am drunk" when I am walking. Patient also reports challenges with peripheral vision, and states that she was told that her cornea may detaching from her eye and has a follow up 11/03/22 to assess further. Patient reports that she takes meclizine 2-3x a day to manage symptoms and states that it seems to help some. Patient reports feeling at times like she is on boat when she tries to walk and reports that when she is not taking her meclizine she feels nauseous to the point that she could throw up. Patient also reports history of headaches but they do not always correspond with her dizziness. Patient reports taking meclizine this morning about 1 hour ago. Patient states that she checks her sugars when she has been having dizzy spells and her sugars are typically within normal limits. Patient reports new onset of double vision within the last 6 months. Patient reports that when she assesses BP when nearly passing out  sometimes she notices drops in her BP.   Pt accompanied by: self and family member - daughter Irving Burton (comes in part way through session)  PERTINENT HISTORY: diabetes mellitus, neuropathy, depression, hypertension and hyperlipidemia  PAIN:  Are you having pain? Yes: NPRS scale: 4-5/10 Pain location: L hip pain, burning numbness in his feet Pain description: aching and burning Aggravating factors: doing a lot of walking Relieving factors: rubbing my legs  PRECAUTIONS: Fall  WEIGHT BEARING RESTRICTIONS: No  FALLS: Has patient fallen in last 6 months? No  LIVING ENVIRONMENT: Lives with: lives with their family Lives in: House/apartment - apartment Stairs: No Has following equipment at home: None  PLOF:  On disability because of eyes - otherwise independent  PATIENT GOALS: "To be able to walk without being in pain or feeling like I am drunk."  OBJECTIVE:   DIAGNOSTIC FINDINGS 01/31/2022:    IMPRESSION: This MRI of the brain without contrast shows the following: Scattered T2/FLAIR  hyperintense foci in the hemispheres and cerebellum consistent with mild chronic microvascular ischemic changes or remote ischemic events.  These are stable compared to the 2016 MRI. Bilateral mastoid effusions.  These are usually caused by eustachian tube dysfunction. No acute findings.  IMPRESSION: Mild mastoid opacification with negative nasopharynx, seen to some degree on scans over multiple years.   COGNITION: Overall cognitive status: Impaired - reports changes in memory over the last 6-8 months   SENSATION: Numbness and tingling reported all the way down right leg into right glute  EDEMA:  Yes - mild edema in bilateral ankles  POSTURE:  rounded shoulders and forward head  Cervical ROM:    Mild stiffness and reductions in sidebending but grossly WFL  TRANSFERS: Assistive device utilized: None  Sit to stand: Modified independence - use of hands to stand Stand to sit: Complete  Independence Chair to chair: Modified independence  GAIT: Gait pattern:  intermittent signs of unsteadiness with mild signs of lateral weaving and stabilizing self on walls as needed Distance walked: 2 x 80' Assistive device utilized: None Level of assistance: SBA  PATIENT SURVEYS:  FOTO 30  VESTIBULAR ASSESSMENT:  GENERAL OBSERVATION: Patient appears anxious and concerned about symptoms. Patient ambulates without AD.   SYMPTOM BEHAVIOR: Subjective history: "Patient reports dizzy spells started about 3 years ago, patient reports tinnitus in both ears." Non-Vestibular symptoms: changes in hearing, changes in vision, neck pain, headaches, tinnitus, and nausea/vomiting Type of dizziness: Blurred Vision, Imbalance (Disequilibrium), Spinning/Vertigo, Unsteady with head/body turns, Lightheadedness/Faint, and "Funny feeling in the head"  Frequency: everyday  Duration: a minute in half  Aggravating factors: Spontaneous  Relieving factors: slow movements and lying still  Progression of symptoms: worse  OCULOMOTOR EXAM: Ocular Alignment: normal - reports seeing "4 of therapist eyes" and multiple other objects in the background as double  Ocular ROM: No Limitations  Spontaneous Nystagmus: absent  Gaze-Induced Nystagmus: absent  Smooth Pursuits: saccades  Saccades: extra eye movements Convergence/Divergence: Abnormal, sees one pen but continues to see repeat of therapist as patient's eyes converge reporting seeing many more of therapist Test of Skew: negative  VESTIBULAR - OCULAR REFLEX:  Slow VOR: Normal and Comment: reports seeing one of therapist but reports dizziness 3-4/10   VOR Cancellation: Normal - reports only seeing one of therapist  Head-Impulse Test: HIT Right: positive HIT Left: positive - reports that everything seems blurry   POSITIONAL TESTING: Not tested   VESTIBULAR TREATMENT:                                                                                                     Initial Eval only  PATIENT EDUCATION: Education details: POC, examination findings, recommended follow up with neurology/ophthalmologist given reports of double vision at baseline without known cause Person educated: Patient and Child(ren) Education method: Explanation Education comprehension: verbalized understanding  HOME EXERCISE PROGRAM:  Not indicated at this time   GOALS: Not indicated at this time   ASSESSMENT:  CLINICAL IMPRESSION: Patient is a 62 y.o. female who was seen today for physical therapy evaluation and treatment for dizziness and  balance deficits with PMH of diabetes mellitus, neuropathy, depression, hypertension and hyperlipidemia. Patient is not being picked up for therapy at this time given unexplained double vision reported at baseline with new onset within the last 6 months and symptomatic reports of feeling as though she may "black out" inconsistent with traditional vestibular symptoms. Patient also presents with baseline dizziness that may correlate with central findings noted in cerebellum from 01/31/22 imaging; however, given ongoing vision complaints and possible retinal detachment per patient, patient would likely benefit from addressing this first before seeking additional work on baseline balance function. Patient reports of nearly blacking out may correlate with orthostatic hypotension given reports that this occurs frequently when standing up or with arms raised over head and patient has noted BP drops when she is symptomatic in the past. Patient vestibular testing results also have to be considered likely impacted by baseline visual deficits and double vision. Recommended at this time that patient follow up first with neurology and ophthalmology to explain baseline double vision to rule out red flags. For this reason not picking patient up at this time; patient is agreeable and reports that she will follow up.  OBJECTIVE IMPAIRMENTS: Abnormal gait,  decreased balance, decreased mobility, difficulty walking, and dizziness.   ACTIVITY LIMITATIONS: bending, standing, and reach over head  PARTICIPATION LIMITATIONS: meal prep, community activity, and yard work  PERSONAL FACTORS: Time since onset of injury/illness/exacerbation and 3+ comorbidities: see above  are also affecting patient's functional outcome.   CLINICAL DECISION MAKING: Evolving/moderate complexity  EVALUATION COMPLEXITY: Moderate   PLAN:  Not picking patient up for therapy - recommend follow up with neurology and ophthalmology given new reports of double vision since most recent imaging study  Carmelia Bake, PT, DPT 11/01/2022, 2:58 PM

## 2022-11-02 ENCOUNTER — Other Ambulatory Visit: Payer: Self-pay | Admitting: Internal Medicine

## 2022-11-08 ENCOUNTER — Ambulatory Visit (INDEPENDENT_AMBULATORY_CARE_PROVIDER_SITE_OTHER): Payer: Medicaid Other

## 2022-11-08 ENCOUNTER — Ambulatory Visit (HOSPITAL_COMMUNITY)
Admission: EM | Admit: 2022-11-08 | Discharge: 2022-11-08 | Disposition: A | Discharge status: Home / Self Care | Payer: Medicaid Other | Attending: Internal Medicine | Admitting: Internal Medicine

## 2022-11-08 ENCOUNTER — Encounter (HOSPITAL_COMMUNITY): Payer: Self-pay

## 2022-11-08 DIAGNOSIS — R0781 Pleurodynia: Secondary | ICD-10-CM

## 2022-11-08 NOTE — ED Triage Notes (Signed)
Here for lower back x 2 days. No recent falls or trauma.

## 2022-11-08 NOTE — Discharge Instructions (Signed)
X-ray was normal.  Suspect rib bruising versus muscular strain.  Advise ice application and Tylenol as needed.  Follow-up with orthopedist if symptoms persist or worsen.

## 2022-11-08 NOTE — ED Provider Notes (Signed)
[facility]    CSN: [id number] Arrival date & time: 11/08/22  1733      History   Chief Complaint Chief Complaint  Patient presents with   Back Pain    HPI [name] is a 62 y.o. female.   Patient presents with right upper back/rib pain that started 2 days ago.  Patient reports that her daughter hugged her really tightly and picked her up causing subsequent pain.  States that she heard a popping sound at the same time.  Patient denies numbness or tingling.  Pain worsens with taking deep breaths.  Patient does not report any medications for pain.   Back Pain   Past Medical History:  Diagnosis Date   Anxiety    Arthritis    Chronic systolic heart failure (HCC)    EF 20-25% ECHO 05/2015   COPD (chronic obstructive pulmonary disease) (HCC)    Depression    Diabetes mellitus without complication (HCC)    Type II   Dysrhythmia    pt unsure of name of arrythmia - " my heart rate will drop all of a sudden" -no current treatment - atrial flutter   Fibrillation, atrial (HCC)    Gallstones    GERD (gastroesophageal reflux disease)    Headache(784.0)    migraines   Hypertension    Osteomyelitis (HCC)    R ankle   Rash    arms    Patient Active Problem List   Diagnosis Date Noted   Allergic rhinitis 04/23/2022   Amblyopia 04/23/2022   Combined fat and carbohydrate induced hyperlipemia 04/23/2022   Compulsive tobacco user syndrome 04/23/2022   D (diarrhea) 04/23/2022   Diabetic retinopathy associated with type 2 diabetes mellitus (HCC) 04/23/2022   Idiopathic osteoarthritis 04/23/2022   Type 2 diabetes mellitus with hyperglycemia, with long-term current use of insulin (HCC) 12/21/2020   Candida vaginitis 12/21/2020   Type 2 diabetes mellitus with diabetic polyneuropathy, with long-term current use of insulin (HCC) 12/21/2020   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/18/2016   Primary osteoarthritis of both hands 10/06/2016   History of gastroesophageal reflux (GERD)  10/06/2016   History of COPD 10/06/2016   History of anxiety 10/06/2016   History of CHF (congestive heart failure) 10/06/2016   History of hypertension 10/06/2016   History of cardiac dysrhythmia 10/06/2016   Primary osteoarthritis of both feet 10/06/2016   Primary osteoarthritis of both knees 10/06/2016   History of infection/ ankle post operative  10/06/2016   Rheumatoid factor positive 10/06/2016   De Quervain's disease (radial styloid tenosynovitis) 06/09/2016   Tobacco abuse 05/23/2016   Hyperglycemia without ketosis    Bleeding hemorrhoids 11/03/2015   Acute blood loss anemia 11/03/2015   Hematochezia 11/01/2015   Back pain with sciatica 09/08/2015   Infection of bone of ankle (HCC) 07/15/2015   Chronic systolic CHF (congestive heart failure) (HCC) 06/24/2015   Chest pain 06/03/2015   Hypertension    Anxiety    Gallstones    GERD (gastroesophageal reflux disease)    COPD (chronic obstructive pulmonary disease) (HCC)    Dysrhythmia    Rash    Depression    Diabetes mellitus without complication (HCC)    Fungal infection of nail 05/14/2015   Fuchs' corneal dystrophy 09/05/2012   Retinitis pigmentosa, both eyes 09/05/2012   Post-operative state 12/10/2011    Past Surgical History:  Procedure Laterality Date   ANKLE FRACTURE SURGERY Right 2015   CARDIAC CATHETERIZATION N/A 06/24/2015   Procedure: Left Heart Cath and  Coronary Angiography;  Surgeon: [name], MD;  Location: [facility] INVASIVE CV LAB;  Service: Cardiovascular;  Laterality: N/A;   CARPAL TUNNEL RELEASE     bil   CHOLECYSTECTOMY  11/24/2011   Procedure: LAPAROSCOPIC CHOLECYSTECTOMY WITH INTRAOPERATIVE CHOLANGIOGRAM;  Surgeon: Clovis Pu. Cornett, MD;  Location: [facility] ORS;  Service: General;  Laterality: N/A;  Laparoscopic Cholecystectomy with Cholangiogram   COLONOSCOPY     ECTOPIC PREGNANCY SURGERY  many yrs ago   HARDWARE REMOVAL Right 07/15/2015   Procedure: RIGHT ANKLE HARDWARE REMOVAL, PLACEMENT OF STIMULAN  ANTIBIOTIC BEADS AND PREVENA WOUND VAC.;  Surgeon: [name], MD;  Location: [facility] OR;  Service: Orthopedics;  Laterality: Right;   TOOTH EXTRACTION N/A 12/03/2016   Procedure: DENTAL EXTRACTIONS with Alveoloplasty;  Surgeon: Ocie Doyne, DDS;  Location: [facility] OR;  Service: Oral Surgery;  Laterality: N/A;    OB History     Gravida  9   Para      Term      Preterm      AB  5   Living  4      SAB  4   IAB      Ectopic  1   Multiple      Live Births  4            Home Medications    Prior to Admission medications   Medication Sig Start Date End Date Taking? Authorizing Provider  ACCU-CHEK GUIDE test strip USE AS DIRECTED IN THE MORNING, AT NOON, IN THE EVENING AND AT BEDTIME 08/30/22   Shamleffer, Konrad Dolores, MD  Accu-Chek Softclix Lancets lancets Use to check blood sugar THREE TIMES DAILY 06/09/21   Pollyann Savoy, MD  albuterol (VENTOLIN HFA) 108 (90 Base) MCG/ACT inhaler Inhale 2 puffs into the lungs every 4 (four) hours as needed for wheezing or shortness of breath. 12/15/21   Hoy Register, MD  amitriptyline (ELAVIL) 50 MG tablet Take 1 tablet (50 mg total) by mouth at bedtime. 08/23/22 08/18/23  Windell Norfolk, MD  Blood Glucose Monitoring Suppl (ACCU-CHEK GUIDE ME) w/Device KIT Use to check blood sugar TID. 01/30/21   Hoy Register, MD  cetirizine (ZYRTEC) 10 MG tablet TAKE 1 TABLET(10 MG) BY MOUTH DAILY 08/10/22   Hoy Register, MD  Continuous Glucose Receiver (DEXCOM G6 RECEIVER) DEVI Use as directed 10/26/22   Shamleffer, Konrad Dolores, MD  Continuous Glucose Sensor (DEXCOM G6 SENSOR) MISC USE 1 DEVICE AS DIRECTED 10/22/22   Shamleffer, Konrad Dolores, MD  Continuous Glucose Transmitter (DEXCOM G6 TRANSMITTER) MISC USE AS DIRECTED 11/02/22   Shamleffer, Konrad Dolores, MD  dicyclomine (BENTYL) 10 MG capsule TAKE ONE CAPSULE BY MOUTH THREE TIMES DAILY BEFORE MEALS 08/12/22   Hoy Register, MD  diphenoxylate-atropine (LOMOTIL) 2.5-0.025 MG tablet  TAKE 1 TABLET BY MOUTH TWICE DAILY 10/22/22   Hoy Register, MD  ergocalciferol (DRISDOL) 1.25 MG (50000 UT) capsule Take 1 capsule (50,000 Units total) by mouth once a week. Patient taking differently: Take 1,000 Units by mouth daily. 03/11/21   Hoy Register, MD  fenofibrate (TRICOR) 145 MG tablet TAKE ONE TABLET BY MOUTH EVERY DAY 08/11/22   Hoy Register, MD  FLUoxetine (PROZAC) 40 MG capsule Take 1 capsule (40 mg total) by mouth at bedtime. 11/01/22   Hoy Register, MD  hydrOXYzine (ATARAX) 10 MG tablet TAKE ONE TABLET BY MOUTH THREE TIMES DAILY AS NEEDED 10/22/22   Hoy Register, MD  Insulin Disposable Pump (OMNIPOD 5 G6 PODS, GEN 5,) MISC USE 1 DEVICE EVERY  3 DAYS 10/22/22   Shamleffer, Konrad Dolores, MD  insulin glargine (LANTUS SOLOSTAR) 100 UNIT/ML Solostar Pen Inject 24 Units into the skin daily. 04/09/22   Shamleffer, Konrad Dolores, MD  Insulin Pen Needle 32G X 4 MM MISC 1 Device by Does not apply route 4 (four) times daily. 04/09/22   Shamleffer, Konrad Dolores, MD  Lancet Devices [facility]) lancets Use as instructed daily. 11/24/16   Hoy Register, MD  meclizine (ANTIVERT) 25 MG tablet TAKE 1 TABLET(25 MG) BY MOUTH TWICE DAILY AS NEEDED FOR DIZZINESS 10/21/22   Hoy Register, MD  metoprolol succinate (TOPROL-XL) 50 MG 24 hr tablet Take 1 tablet (50 mg total) by mouth daily. 06/02/22   Hoy Register, MD  mupirocin ointment (BACTROBAN) 2 % Apply 1 Application topically 2 (two) times daily. 06/03/22   Hoy Register, MD  NOVOLOG FLEXPEN 100 UNIT/ML FlexPen Max Daily 50 units per correction scale 06/08/22   Shamleffer, Konrad Dolores, MD  omeprazole (PRILOSEC) 40 MG capsule Take 1 capsule (40 mg total) by mouth daily. 08/13/22   Hoy Register, MD  pregabalin (LYRICA) 150 MG capsule TAKE 1 CAPSULE(150 MG) BY MOUTH TWICE DAILY 09/09/22   Windell Norfolk, MD  promethazine (PHENERGAN) 25 MG tablet 1/2-1 tab every 4 to 6 hours prn nausea 03/08/22   Hoy Register, MD   rosuvastatin (CRESTOR) 10 MG tablet Take 1 tablet (10 mg total) by mouth daily. 06/08/22   Hoy Register, MD    Family History Family History  Problem Relation Age of Onset   Diabetes Mother    Lung cancer Father    Heart attack Father 67       CABG x4   Breast cancer Sister        Survivor     Social History Social History   Tobacco Use   Smoking status: Every Day    Packs/day: 1.00    Years: 20.00    Additional pack years: 0.00    Total pack years: 20.00    Types: Cigarettes, E-cigarettes    Start date: 09/19/2016   Smokeless tobacco: Never  Vaping Use   Vaping Use: Every day   Start date: 10/12/2016  Substance Use Topics   Alcohol use: No    Alcohol/week: 0.0 standard drinks of alcohol   Drug use: No     Allergies   Biaxin [clarithromycin], Clarithromycin, Lisinopril, Sulfa antibiotics, Chlorthalidone, Daptomycin, Latex, Amoxicillin-pot clavulanate, Aspirin, Cholestyramine, Dilaudid [hydromorphone hcl], and Lasix [furosemide]   Review of Systems Review of Systems Per HPI  Physical Exam Triage Vital Signs ED Triage Vitals [11/08/22 1806]  Enc Vitals Group     BP 117/72     Pulse Rate 88     Resp 18     Temp 98 F (36.7 C)     Temp Source Oral     SpO2 98 %     Weight      Height      Head Circumference      Peak Flow      Pain Score      Pain Loc      Pain Edu?      Excl. in GC?    No data found.  Updated Vital Signs BP 117/72 (BP Location: Left Arm)   Pulse 88   Temp 98 F (36.7 C) (Oral)   Resp 18   LMP 06/09/2011 Comment: verified BEFORE imaging  SpO2 98%   Visual Acuity Right Eye Distance:   Left Eye Distance:   Bilateral Distance:  Right Eye Near:   Left Eye Near:    Bilateral Near:     Physical Exam Constitutional:      General: She is not in acute distress.    Appearance: Normal appearance. She is not toxic-appearing or diaphoretic.  HENT:     Head: Normocephalic and atraumatic.  Eyes:     Extraocular Movements:  Extraocular movements intact.     Conjunctiva/sclera: Conjunctivae normal.  Cardiovascular:     Rate and Rhythm: Normal rate and regular rhythm.     Pulses: Normal pulses.     Heart sounds: Normal heart sounds.  Pulmonary:     Effort: Pulmonary effort is normal. No respiratory distress.     Breath sounds: Normal breath sounds. No stridor. No wheezing, rhonchi or rales.  Musculoskeletal:     Comments: Patient has tenderness to palpation to right upper thoracic back that extends slightly into lateral ribs.  No obvious bruising, discoloration, crepitus, lacerations, abrasions noted.  Neurological:     General: No focal deficit present.     Mental Status: She is alert and oriented to person, place, and time. Mental status is at baseline.  Psychiatric:        Mood and Affect: Mood normal.        Behavior: Behavior normal.        Thought Content: Thought content normal.        Judgment: Judgment normal.      UC Treatments / Results  Labs (all labs ordered are listed, but only abnormal results are displayed) Labs Reviewed - No data to display  EKG   Radiology DG Ribs Unilateral W/Chest Right  Result Date: 11/08/2022 CLINICAL DATA:  Rib and lower back pain EXAM: RIGHT RIBS AND CHEST - 3+ VIEW COMPARISON:  11/05/2021 FINDINGS: No displaced fracture or other bone lesions are seen involving the ribs. There is no evidence of pneumothorax or pleural effusion. Both lungs are clear. Heart size and mediastinal contours are within normal limits. IMPRESSION: Negative. Electronically Signed   By: Wiliam Ke M.D.   On: 11/08/2022 19:45    Procedures Procedures (including critical care time)  Medications Ordered in UC Medications - No data to display  Initial Impression / Assessment and Plan / UC Course  I have reviewed the triage vital signs and the nursing notes.  Pertinent labs & imaging results that were available during my care of the patient were reviewed by me and considered in my  medical decision making (see chart for details).     Right rib x-ray was negative for any acute bony abnormality.  Suspect rib bruising versus muscular strain/injury given location of pain.  Advised ice application and supportive care.  Patient unable to take NSAIDs so IM Toradol was deferred.  Patient takes Lyrica no narcotics were deferred.  Patient able to take Tylenol so encouraged this as well as deep breathing.  Advised follow-up with orthopedist at provided contact information if symptoms persist or worsen.  Patient verbalized understanding and was agreeable with plan.` Final Clinical Impressions(s) / UC Diagnoses   Final diagnoses:  Rib pain on right side     Discharge Instructions      X-ray was normal.  Suspect rib bruising versus muscular strain.  Advise ice application and Tylenol as needed.  Follow-up with orthopedist if symptoms persist or worsen.     ED Prescriptions   None    PDMP not reviewed this encounter.   Gustavus Bryant, [place] 11/08/22 2017

## 2022-11-10 ENCOUNTER — Encounter: Payer: Self-pay | Admitting: Family Medicine

## 2022-11-23 ENCOUNTER — Ambulatory Visit
Admission: RE | Admit: 2022-11-23 | Discharge: 2022-11-23 | Disposition: A | Discharge status: Home / Self Care | Payer: Medicaid Other | Source: Ambulatory Visit | Attending: Family Medicine | Admitting: Family Medicine

## 2022-11-23 DIAGNOSIS — F17210 Nicotine dependence, cigarettes, uncomplicated: Secondary | ICD-10-CM

## 2022-11-25 ENCOUNTER — Ambulatory Visit: Payer: Medicaid Other | Admitting: Neurology

## 2022-11-29 ENCOUNTER — Other Ambulatory Visit: Payer: Self-pay | Admitting: Family Medicine

## 2022-11-29 ENCOUNTER — Ambulatory Visit: Payer: Self-pay | Admitting: *Deleted

## 2022-11-29 DIAGNOSIS — R918 Other nonspecific abnormal finding of lung field: Secondary | ICD-10-CM

## 2022-11-29 NOTE — Telephone Encounter (Signed)
Call from Diane with [facility] Radiology to report results from Chest CT from 11/23/22.  Narrative & Impression  CLINICAL DATA:  62 year old female former smoker with greater than 20 pack-year smoking history.   EXAM: CT CHEST WITHOUT CONTRAST LOW-DOSE FOR LUNG CANCER SCREENING   TECHNIQUE: Multidetector CT imaging of the chest was performed following the standard protocol without IV contrast.   RADIATION DOSE REDUCTION: This exam was performed according to the departmental dose-optimization program which includes automated exposure control, adjustment of the mA and/or kV according to patient size and/or use of iterative reconstruction technique.   COMPARISON:  03/23/2021 screening chest CT.   FINDINGS: Cardiovascular: Normal heart size. No significant pericardial effusion/thickening. Left anterior descending and right coronary atherosclerosis. Atherosclerotic nonaneurysmal thoracic aorta. Normal caliber pulmonary arteries.   Mediastinum/Nodes: No significant thyroid nodules. Unremarkable esophagus. No pathologically enlarged axillary, mediastinal or hilar lymph nodes, noting limited sensitivity for the detection of hilar adenopathy on this noncontrast study.   Lungs/Pleura: No pneumothorax. No pleural effusion. Mild centrilobular emphysema with diffuse bronchial wall thickening. No acute consolidative airspace disease or lung masses. New focal endobronchial density in the posteromedial right middle lobe measuring 7 mm (series 9/image 173). New tiny 3 mm anterior right lower lobe nodule (series 9/image 210).   Upper abdomen: Cholecystectomy.   Musculoskeletal: No aggressive appearing focal osseous lesions. Mild thoracic spondylosis.   IMPRESSION: 1. Lung-RADS 4A, suspicious. Follow up low-dose chest CT without contrast in 3 months (please use the following order, "CT CHEST LCS NODULE FOLLOW-UP W/O CM") is recommended. New focal endobronchial density in the posteromedial  right middle lobe measuring 7 mm, either mucoid impaction or endobronchial lesion. 2. Two-vessel coronary atherosclerosis. 3. Aortic Atherosclerosis (ICD10-I70.0) and Emphysema (ICD10-J43.9).

## 2022-11-29 NOTE — Telephone Encounter (Signed)
Addressed.

## 2022-12-02 ENCOUNTER — Ambulatory Visit: Payer: Self-pay | Admitting: Family Medicine

## 2022-12-16 ENCOUNTER — Other Ambulatory Visit: Payer: Self-pay | Admitting: Family Medicine

## 2022-12-16 DIAGNOSIS — E1149 Type 2 diabetes mellitus with other diabetic neurological complication: Secondary | ICD-10-CM

## 2022-12-24 ENCOUNTER — Ambulatory Visit: Payer: Medicaid Other | Admitting: Internal Medicine

## 2022-12-24 NOTE — Progress Notes (Deleted)
Name: [name]  MRN/ DOB: [id number], May 17, 1961   Age/ Sex: 62 y.o., female    PCP: Hoy Register, MD   Reason for Endocrinology Evaluation: Type 2 Diabetes Mellitus     Date of Initial Endocrinology Visit: 12/19/2020    PATIENT IDENTIFIER: [name] is a 62 y.o. female with a past medical history of T2DM, HTN, COPD  , retinitis pigmentosa ,and CHF. The patient presented for initial endocrinology clinic visit on 12/19/2020 for consultative assistance with her diabetes management.    HPI: [name] was    Diagnosed with DM 2017 Prior Medications tried/Intolerance: Metformin - diarrhea, Trulicity - nausea                  Hemoglobin A1c has ranged from 9.0% in 2018, peaking at 13.4% in 2022.  On her initial visit to our clinic her A1c was 11.9% , she was on insulin mix and Glimepiride , which we stopped and started her on Trulicity and Lantus   I have prescribed the OmniPod in 2023 but the patient stated it was stolen   SUBJECTIVE:   During the last visit (04/09/2022): A1c 8.3 %    Today (12/24/22): [name] is here for a follow up on diabetes management. She is accompanied by her daughter today . She checks her blood sugars 4-5  times daily through CGM. The patient has not had hypoglycemic episodes since the last clinic visit, occur sporadically at night    She denies nausea but has noted loose stools She recently had another COVID infection requiring prednisone intake I did prescribe OmniPod but she tells me it was stolen She has ingrown nails of the 2 great toes and have been bothersome to her  HOME DIABETES REGIMEN: Novolog 12 units TIDQAC Lantus 24 units daily CF: Novolog (BG -130/30)     Statin: yes ACE-I/ARB: Allergic to lisinopril - swelling  Prior Diabetic Education: no    CONTINUOUS GLUCOSE MONITORING RECORD INTERPRETATION    Dates of Recording: 9/30 - 04/09/2022  Sensor description: dexcom   Results statistics:   CGM  use % of time 79  Average and SD 204/56  Time in range    36%  % Time Above 180 43  % Time above 250 20  % Time Below target <1     Glycemic patterns summary: Bg's optimal overnight, hyperglycemia noted postprandial   Hyperglycemic episodes postprandial  Hypoglycemic episodes and a over the past 2 weeks  Overnight periods: stable        DIABETIC COMPLICATIONS: Microvascular complications:  Neuropathy Denies: CKD,retinopathy  Last eye exam: 12/2020  Macrovascular complications:  CHF  Denies: CAD, PVD, CVA   PAST HISTORY: Past Medical History:  Past Medical History:  Diagnosis Date   Anxiety    Arthritis    Chronic systolic heart failure (HCC)    EF 20-25% ECHO 05/2015   COPD (chronic obstructive pulmonary disease) (HCC)    Depression    Diabetes mellitus without complication (HCC)    Type II   Dysrhythmia    pt unsure of name of arrythmia - " my heart rate will drop all of a sudden" -no current treatment - atrial flutter   Fibrillation, atrial (HCC)    Gallstones    GERD (gastroesophageal reflux disease)    Headache(784.0)    migraines   Hypertension    Osteomyelitis (HCC)    R ankle   Rash    arms   Past Surgical History:  Past  Surgical History:  Procedure Laterality Date   ANKLE FRACTURE SURGERY Right 2015   CARDIAC CATHETERIZATION N/A 06/24/2015   Procedure: Left Heart Cath and Coronary Angiography;  Surgeon: [name], MD;  Location: [facility] INVASIVE CV LAB;  Service: Cardiovascular;  Laterality: N/A;   CARPAL TUNNEL RELEASE     bil   CHOLECYSTECTOMY  11/24/2011   Procedure: LAPAROSCOPIC CHOLECYSTECTOMY WITH INTRAOPERATIVE CHOLANGIOGRAM;  Surgeon: Clovis Pu. Cornett, MD;  Location: [facility] ORS;  Service: General;  Laterality: N/A;  Laparoscopic Cholecystectomy with Cholangiogram   COLONOSCOPY     ECTOPIC PREGNANCY SURGERY  many yrs ago   HARDWARE REMOVAL Right 07/15/2015   Procedure: RIGHT ANKLE HARDWARE REMOVAL, PLACEMENT OF STIMULAN ANTIBIOTIC BEADS  AND PREVENA WOUND VAC.;  Surgeon: [name], MD;  Location: [facility] OR;  Service: Orthopedics;  Laterality: Right;   TOOTH EXTRACTION N/A 12/03/2016   Procedure: DENTAL EXTRACTIONS with Alveoloplasty;  Surgeon: Ocie Doyne, DDS;  Location: [facility] OR;  Service: Oral Surgery;  Laterality: N/A;    Social History:  reports that she has been smoking cigarettes and e-cigarettes. She started smoking about 6 years ago. She has a 20.00 pack-year smoking history. She has never used smokeless tobacco. She reports that she does not drink alcohol and does not use drugs. Family History:  Family History  Problem Relation Age of Onset   Diabetes Mother    Lung cancer Father    Heart attack Father 43       CABG x4   Breast cancer Sister        Survivor      HOME MEDICATIONS: Allergies as of 12/24/2022       Reactions   Biaxin [clarithromycin] Anaphylaxis, Swelling   Clarithromycin Shortness Of Breath, Swelling   Lisinopril Swelling, Cough   Lip edema   Sulfa Antibiotics Anaphylaxis, Shortness Of Breath, Swelling, Hypertension   Chlorthalidone Nausea And Vomiting, Other (See Comments)   Clammy, Tachycardia, Headache   Daptomycin Nausea And Vomiting   Latex Hives, Rash   Amoxicillin-pot Clavulanate Diarrhea   Has patient had a PCN reaction causing immediate rash, facial/tongue/throat swelling, SOB or lightheadedness with hypotension:No Has patient had a PCN reaction causing severe rash involving mucus membranes or skin necrosis:No Has patient had a PCN reaction that required hospitalization:No Has patient had a PCN reaction occurring within THE LAST 10 YEARS.  #  #  #  YES  #  #  #  If all of the above answers are "NO", then may proceed with Cephalosporin use.   Aspirin Nausea And Vomiting, Rash   On 325mg  dosage   Cholestyramine Nausea And Vomiting   Dilaudid [hydromorphone Hcl] Nausea And Vomiting   Lasix [furosemide] Nausea And Vomiting, Other (See Comments)   Headache         Medication  List        Accurate as of December 24, 2022  7:36 AM. If you have any questions, ask your nurse or doctor.          Accu-Chek Guide Me w/Device Kit Use to check blood sugar TID.   Accu-Chek Guide test strip Generic drug: glucose blood USE AS DIRECTED IN THE MORNING, AT NOON, IN THE EVENING AND AT BEDTIME   accu-chek softclix lancets Use as instructed daily.   Accu-Chek Softclix Lancets lancets Use to check blood sugar THREE TIMES DAILY   albuterol 108 (90 Base) MCG/ACT inhaler Commonly known as: Ventolin HFA Inhale 2 puffs into the lungs every 4 (four) hours as needed  for wheezing or shortness of breath.   amitriptyline 50 MG tablet Commonly known as: ELAVIL Take 1 tablet (50 mg total) by mouth at bedtime.   cetirizine 10 MG tablet Commonly known as: ZYRTEC TAKE 1 TABLET(10 MG) BY MOUTH DAILY   Dexcom G6 Receiver Devi Use as directed   Dexcom G6 Sensor Misc USE 1 DEVICE AS DIRECTED   Dexcom G6 Transmitter Misc USE AS DIRECTED   dicyclomine 10 MG capsule Commonly known as: BENTYL TAKE ONE CAPSULE BY MOUTH THREE TIMES DAILY BEFORE MEALS   diphenoxylate-atropine 2.5-0.025 MG tablet Commonly known as: LOMOTIL TAKE 1 TABLET BY MOUTH TWICE DAILY   ergocalciferol 1.25 MG (50000 UT) capsule Commonly known as: Drisdol Take 1 capsule (50,000 Units total) by mouth once a week. What changed:  how much to take when to take this   fenofibrate 145 MG tablet Commonly known as: TRICOR TAKE ONE TABLET BY MOUTH EVERY DAY   FLUoxetine 40 MG capsule Commonly known as: PROZAC Take 1 capsule (40 mg total) by mouth at bedtime.   hydrOXYzine 10 MG tablet Commonly known as: ATARAX TAKE ONE TABLET BY MOUTH THREE TIMES DAILY AS NEEDED   Insulin Pen Needle 32G X 4 MM Misc 1 Device by Does not apply route 4 (four) times daily.   Lantus SoloStar 100 UNIT/ML Solostar Pen Generic drug: insulin glargine Inject 24 Units into the skin daily.   meclizine 25 MG tablet Commonly  known as: ANTIVERT TAKE 1 TABLET(25 MG) BY MOUTH TWICE DAILY AS NEEDED FOR DIZZINESS   metoprolol succinate 50 MG 24 hr tablet Commonly known as: TOPROL-XL Take 1 tablet (50 mg total) by mouth daily.   mupirocin ointment 2 % Commonly known as: BACTROBAN Apply 1 Application topically 2 (two) times daily.   NovoLOG FlexPen 100 UNIT/ML FlexPen Generic drug: insulin aspart Max Daily 50 units per correction scale   omeprazole 40 MG capsule Commonly known as: PRILOSEC Take 1 capsule (40 mg total) by mouth daily.   Omnipod 5 G6 Pods (Gen 5) Misc USE 1 DEVICE EVERY 3 DAYS   pregabalin 150 MG capsule Commonly known as: LYRICA TAKE 1 CAPSULE(150 MG) BY MOUTH TWICE DAILY   promethazine 25 MG tablet Commonly known as: PHENERGAN 1/2-1 tab every 4 to 6 hours prn nausea   rosuvastatin 10 MG tablet Commonly known as: CRESTOR Take 1 tablet (10 mg total) by mouth daily.         ALLERGIES: Allergies  Allergen Reactions   Biaxin [Clarithromycin] Anaphylaxis and Swelling   Clarithromycin Shortness Of Breath and Swelling   Lisinopril Swelling and Cough    Lip edema   Sulfa Antibiotics Anaphylaxis, Shortness Of Breath, Swelling and Hypertension   Chlorthalidone Nausea And Vomiting and Other (See Comments)    Clammy, Tachycardia, Headache    Daptomycin Nausea And Vomiting   Latex Hives and Rash   Amoxicillin-Pot Clavulanate Diarrhea    Has patient had a PCN reaction causing immediate rash, facial/tongue/throat swelling, SOB or lightheadedness with hypotension:No Has patient had a PCN reaction causing severe rash involving mucus membranes or skin necrosis:No Has patient had a PCN reaction that required hospitalization:No Has patient had a PCN reaction occurring within THE LAST 10 YEARS.  #  #  #  YES  #  #  #  If all of the above answers are "NO", then may proceed with Cephalosporin use.    Aspirin Nausea And Vomiting and Rash    On 325mg  dosage   Cholestyramine Nausea And  Vomiting   Dilaudid [Hydromorphone Hcl] Nausea And Vomiting   Lasix [Furosemide] Nausea And Vomiting and Other (See Comments)    Headache        OBJECTIVE:   VITAL SIGNS: LMP 06/09/2011 Comment: verified BEFORE imaging   PHYSICAL EXAM:  General: Pt appears well and is in NAD  Lungs: Scattered rhonchi  Heart: RRR  Abdomen: soft, nontender  Extremities:  Lower extremities - No pretibial edema.  Neuro: MS is good with appropriate affect, pt is alert and Ox3   DM foot exam 04/09/2022  The skin of the feet is intact without sores or ulcerations. The pedal pulses are 2+ on right and 2+ on left. The sensation is intact to a screening 5.07, 10 gram monofilament bilaterally   DATA REVIEWED:  Lab Results  Component Value Date   HGBA1C 8.4 (H) 06/03/2022   HGBA1C 8.3 (A) 04/09/2022   HGBA1C 9.2 (A) 10/08/2021   Lab Results  Component Value Date   LDLCALC 82 06/03/2022   CREATININE 0.67 06/03/2022   Lab Results  Component Value Date   MICRALBCREAT 11 06/03/2022    Lab Results  Component Value Date   CHOL 142 06/03/2022   HDL 43 06/03/2022   LDLCALC 82 06/03/2022   TRIG 87 06/03/2022   CHOLHDL 5.6 (H) 07/31/2019        ASSESSMENT / PLAN / RECOMMENDATIONS:   1) Type 2 Diabetes Mellitus, Poorly controlled, With neuropathic  complications - Most recent A1c of 8.3 %. Goal A1c < 7.0 %.    -I have praised the patient on continued glycemic improvement, A1c down from 9.2% to 8.3% -Intolerant to Trulicity 0.75 mg weekly -I prescribed OmniPod for her, but the patient tells me this was stolen, I will refill this again, I will refer her to our CDE for training -She was also provided with a pamphlet to train her pump prior to training -In review on her CGM data, patient has been noted with postprandial hyperglycemia, will increase NovoLog as below  MEDICATIONS: Continue Lantus 24 units once daily  Increase NovoLog  12 units 3 times daily before every meal Continue correction  factor: NovoLog (BG -130/30)   EDUCATION / INSTRUCTIONS: BG monitoring instructions: Patient is instructed to check her blood sugars 1 times a day, fasting. Call [facility] if: BG persistently < 70  I reviewed the Rule of 15 for the treatment of hypoglycemia in detail with the patient. Literature supplied.   2) Diabetic complications:  Eye: Does not have known diabetic retinopathy.  Neuro/ Feet: Does  have known diabetic peripheral neuropathy. Renal: Patient does not have known baseline CKD. She is not on an ACEI/ARB at present.     F/U in 4 months    Signed electronically by: Lyndle Herrlich, MD  [facility] Endocrinology  [facility] [address] [place], [place] [address] Phone: [phone] FAX: [phone]   CC: Hoy Register, MD [address] [place] [address] [place] [address] Phone: [phone]  Fax: [phone]    Return to Endocrinology clinic as below: Future Appointments  Date Time Provider Department Center  12/24/2022 11:50 AM Kohan Azizi, Konrad Dolores, MD [facility] None  01/06/2023 10:30 AM Leslye Peer, MD [facility] None  02/22/2023  3:10 PM Hoy Register, MD [facility] None  04/21/2023  1:45 PM Windell Norfolk, MD [facility] None

## 2022-12-27 ENCOUNTER — Other Ambulatory Visit: Payer: Self-pay | Admitting: Internal Medicine

## 2023-01-05 ENCOUNTER — Telehealth: Payer: Self-pay | Admitting: Nutrition

## 2023-01-05 NOTE — Telephone Encounter (Signed)
Pt. Called to see if she is still interested in starting her OmniPod 5 insulin pump.  She does not want to use this and said prefers to give herself the injections.

## 2023-01-06 ENCOUNTER — Encounter: Payer: Self-pay | Admitting: Emergency Medicine

## 2023-01-06 ENCOUNTER — Ambulatory Visit (INDEPENDENT_AMBULATORY_CARE_PROVIDER_SITE_OTHER): Payer: Medicaid Other | Admitting: Emergency Medicine

## 2023-01-06 VITALS — BP 134/80 | HR 90 | Temp 98.5°F | Ht 67.0 in | Wt 153.8 lb

## 2023-01-06 DIAGNOSIS — J410 Simple chronic bronchitis: Secondary | ICD-10-CM | POA: Diagnosis not present

## 2023-01-06 DIAGNOSIS — R911 Solitary pulmonary nodule: Secondary | ICD-10-CM | POA: Diagnosis not present

## 2023-01-06 DIAGNOSIS — F17210 Nicotine dependence, cigarettes, uncomplicated: Secondary | ICD-10-CM | POA: Diagnosis not present

## 2023-01-06 DIAGNOSIS — Z720 Tobacco use: Secondary | ICD-10-CM

## 2023-01-06 MED ORDER — SPIRIVA RESPIMAT 1.25 MCG/ACT IN AERS: 2.0000 | INHALATION_SPRAY | Freq: Every day | RESPIRATORY_TRACT | 5 refills | Status: DC

## 2023-01-06 MED ORDER — SPIRIVA RESPIMAT 1.25 MCG/ACT IN AERS: 2.0000 | INHALATION_SPRAY | Freq: Every day | RESPIRATORY_TRACT | 0 refills | Status: DC

## 2023-01-06 NOTE — Assessment & Plan Note (Signed)
She has been on Spiriva in the past, cannot remember why it was stopped but she may have had difficulty tolerating.  We will do a retrial.  If she has difficulty we can try changing to ICS/LABA.  Keep your albuterol available to use if needed.  Her pulmonary function testing was done in 2015, can consider timing for repeat going forward.

## 2023-01-06 NOTE — Progress Notes (Signed)
Subjective:    Patient ID: [name], female    DOB: [birth year], 62 y.o.   MRN: [id number]  HPI 62 year old woman with a history of active tobacco use and vape use (20 pack years).  She has diabetes, hypertension, COPD, atrial flutter, chronic systolic CHF.  She participates in lung cancer screening program with the most recent scan 11/23/2022 as below.  She has cut down her cigarettes, currently smoking 4 cigarettes daily.  She is motivated to quit.  She has albuterol that she uses as needed, uses it approximately 2x a day. Good functional capacity but she can get SOB with long walks. She sometimes has to stop. She coughs daily usually non-productive. She can occasionally hear wheeze, especially in the am. She was on spiriva at some point in the past.   Lung cancer screening CT chest 11/23/2022 reviewed by me shows no mediastinal or hilar adenopathy, mild centrilobular emphysema and diffuse bronchial wall thickening.  There is a new focal endobronchial density in the posterior medial right middle lobe 7 mm compared with 03/23/2021.  Also a new tiny 3 mm anterior right lower lobe nodule.  Pulmonary function testing 10/04/2013 reviewed by me, shows mixed obstruction and restriction with a positive bronchodilator response.  FEV1 2.34 L (69% predicted).  Lung volumes confirm mild restriction.  Diffusion capacity slightly decreased and corrects to normal range when adjusted for alveolar volume.   Review of Systems As per HPI  Past Medical History:  Diagnosis Date   Anxiety    Arthritis    Chronic systolic heart failure (HCC)    EF 20-25% ECHO 05/2015   COPD (chronic obstructive pulmonary disease) (HCC)    Depression    Diabetes mellitus without complication (HCC)    Type II   Dysrhythmia    pt unsure of name of arrythmia - " my heart rate will drop all of a sudden" -no current treatment - atrial flutter   Fibrillation, atrial (HCC)    Gallstones    GERD (gastroesophageal reflux  disease)    Headache(784.0)    migraines   Hypertension    Osteomyelitis (HCC)    R ankle   Rash    arms     Family History  Problem Relation Age of Onset   Diabetes Mother    Lung cancer Father    Heart attack Father 15       CABG x4   Breast cancer Sister        Survivor     Son >> sarcoidosis  Social History   Socioeconomic History   Marital status: Divorced    Spouse name: Not on file   Number of children: Not on file   Years of education: Not on file   Highest education level: Not on file  Occupational History   Not on file  Tobacco Use   Smoking status: Every Day    Current packs/day: 1.00    Average packs/day: 1 pack/day for 20.0 years (20.0 ttl pk-yrs)    Types: Cigarettes, E-cigarettes    Start date: 09/19/2016   Smokeless tobacco: Never   Tobacco comments:    Pt stated smoking 4 cigarettes daily ARJ 01/06/23  Vaping Use   Vaping status: Every Day   Start date: 10/12/2016  Substance and Sexual Activity   Alcohol use: No    Alcohol/week: 0.0 standard drinks of alcohol   Drug use: No   Sexual activity: Not Currently    Birth control/protection: Post-menopausal  Other Topics Concern  Not on file  Social History Narrative   Not on file   Social Determinants of Health   Financial Resource Strain: Not on file  Food Insecurity: Not on file  Transportation Needs: Not on file  Physical Activity: Not on file  Stress: Not on file  Social Connections: Not on file  Intimate Partner Violence: Not on file     Allergies  Allergen Reactions   Biaxin [Clarithromycin] Anaphylaxis and Swelling   Clarithromycin Shortness Of Breath and Swelling   Lisinopril Swelling and Cough    Lip edema   Sulfa Antibiotics Anaphylaxis, Shortness Of Breath, Swelling and Hypertension   Chlorthalidone Nausea And Vomiting and Other (See Comments)    Clammy, Tachycardia, Headache    Daptomycin Nausea And Vomiting   Latex Hives and Rash   Amoxicillin-Pot Clavulanate Diarrhea     Has patient had a PCN reaction causing immediate rash, facial/tongue/throat swelling, SOB or lightheadedness with hypotension:No Has patient had a PCN reaction causing severe rash involving mucus membranes or skin necrosis:No Has patient had a PCN reaction that required hospitalization:No Has patient had a PCN reaction occurring within THE LAST 10 YEARS.  #  #  #  YES  #  #  #  If all of the above answers are "NO", then may proceed with Cephalosporin use.    Aspirin Nausea And Vomiting and Rash    On 325mg  dosage   Cholestyramine Nausea And Vomiting   Dilaudid [Hydromorphone Hcl] Nausea And Vomiting   Lasix [Furosemide] Nausea And Vomiting and Other (See Comments)    Headache      Outpatient Medications Prior to Visit  Medication Sig Dispense Refill   albuterol (VENTOLIN HFA) 108 (90 Base) MCG/ACT inhaler Inhale 2 puffs into the lungs every 4 (four) hours as needed for wheezing or shortness of breath. 18 g 2   amitriptyline (ELAVIL) 50 MG tablet Take 1 tablet (50 mg total) by mouth at bedtime. 90 tablet 3   cetirizine (ZYRTEC) 10 MG tablet TAKE 1 TABLET(10 MG) BY MOUTH DAILY 90 tablet 2   dicyclomine (BENTYL) 10 MG capsule TAKE ONE CAPSULE BY MOUTH THREE TIMES DAILY BEFORE MEALS 270 capsule 0   diphenoxylate-atropine (LOMOTIL) 2.5-0.025 MG tablet TAKE 1 TABLET BY MOUTH TWICE DAILY 180 tablet 1   ergocalciferol (DRISDOL) 1.25 MG (50000 UT) capsule Take 1 capsule (50,000 Units total) by mouth once a week. (Patient taking differently: Take 1,000 Units by mouth daily.) 12 capsule 1   fenofibrate (TRICOR) 145 MG tablet TAKE ONE TABLET BY MOUTH EVERY DAY 90 tablet 0   FLUoxetine (PROZAC) 40 MG capsule Take 1 capsule (40 mg total) by mouth at bedtime. 30 capsule 0   hydrOXYzine (ATARAX) 10 MG tablet TAKE ONE TABLET BY MOUTH THREE TIMES DAILY AS NEEDED 90 tablet 2   Lancet Devices (ACCU-CHEK SOFTCLIX) lancets Use as instructed daily. 1 each 5   LANTUS SOLOSTAR 100 UNIT/ML Solostar Pen INJECT 24  UNITS UNDER THE SKIN DAILY 30 mL 2   meclizine (ANTIVERT) 25 MG tablet TAKE 1 TABLET(25 MG) BY MOUTH TWICE DAILY AS NEEDED FOR DIZZINESS 180 tablet 0   metoprolol succinate (TOPROL-XL) 50 MG 24 hr tablet Take 1 tablet (50 mg total) by mouth daily. 90 tablet 1   NOVOLOG FLEXPEN 100 UNIT/ML FlexPen Max Daily 50 units per correction scale 45 mL 2   omeprazole (PRILOSEC) 40 MG capsule Take 1 capsule (40 mg total) by mouth daily. 90 capsule 1   pregabalin (LYRICA) 150 MG capsule TAKE  1 CAPSULE(150 MG) BY MOUTH TWICE DAILY 60 capsule 4   promethazine (PHENERGAN) 25 MG tablet 1/2-1 tab every 4 to 6 hours prn nausea 20 tablet 0   rosuvastatin (CRESTOR) 10 MG tablet Take 1 tablet (10 mg total) by mouth daily. 90 tablet 0   ACCU-CHEK GUIDE test strip USE AS DIRECTED IN THE MORNING, AT NOON, IN THE EVENING AND AT BEDTIME 400 strip 3   Accu-Chek Softclix Lancets lancets Use to check blood sugar THREE TIMES DAILY 100 each 2   BD PEN NEEDLE NANO 2ND GEN 32G X 4 MM MISC USE 1 PEN NEEDLE UNDER THE SKIN FOUR TIMES DAILY 400 each 2   Blood Glucose Monitoring Suppl (ACCU-CHEK GUIDE ME) w/Device KIT Use to check blood sugar TID. 1 kit 0   Continuous Glucose Receiver (DEXCOM G6 RECEIVER) DEVI Use as directed 1 each 0   Continuous Glucose Sensor (DEXCOM G6 SENSOR) MISC USE 1 DEVICE AS DIRECTED 9 each 4   Continuous Glucose Transmitter (DEXCOM G6 TRANSMITTER) MISC USE AS DIRECTED 1 each 3   Insulin Disposable Pump (OMNIPOD 5 G6 PODS, GEN 5,) MISC USE 1 DEVICE EVERY 3 DAYS 30 each 3   mupirocin ointment (BACTROBAN) 2 % Apply 1 Application topically 2 (two) times daily. (Patient not taking: Reported on 01/06/2023) 22 g 1   Facility-Administered Medications Prior to Visit  Medication Dose Route Frequency Provider Last Rate Last Admin   regadenoson (LEXISCAN) injection SOLN 0.4 mg  0.4 mg Intravenous Once Lars Masson, MD       technetium tetrofosmin (TC-MYOVIEW) injection 32.8 millicurie  32.8 millicurie Intravenous  Once PRN Lars Masson, MD            Objective:   Physical Exam  Vitals:   01/06/23 1005  BP: 134/80  Pulse: 90  Temp: 98.5 F (36.9 C)  TempSrc: Oral  SpO2: 98%  Weight: 153 lb 12.8 oz (69.8 kg)  Height: 5\' 7"  (1.702 m)   Gen: Pleasant, well-nourished, in no distress,  normal affect  ENT: No lesions,  mouth clear,  oropharynx clear, no postnasal drip  Neck: No JVD, no stridor  Lungs: No use of accessory muscles, bilateral inspiratory rhonchi, no wheezes  Cardiovascular: RRR, heart sounds normal, no murmur or gallops, no peripheral edema  Musculoskeletal: No deformities, no cyanosis or clubbing  Neuro: alert, awake, non focal  Skin: Warm, scattered rash on back, face, arms with some evidence of excoriation      Assessment & Plan:  Pulmonary nodule New right middle lobe pulmonary nodule noted on her screening CT 11/23/2022, appears to be in the airway, question endobronchial lesion versus mucous impaction, etc.  Discussed the differential diagnosis with her.  We will plan to repeat her CT chest at the 50-month mark.  If the nodule persists then would be reasonable for [place] to talk about either primary resection or diagnostics given her risk profile.  Would probably push for navigational bronchoscopy to get a tissue diagnosis before committing to surgery.  Her pulmonary function testing is consistent with mixed obstruction and restriction but she would probably be a reasonable surgical candidate.  We reviewed your CT scan of the chest today We will plan to repeat a CT scan of the chest in late August 2024 to compare with priors. Follow Dr. Delton Coombes in August after your CT chest so we can review.  COPD (chronic obstructive pulmonary disease) (HCC) She has been on Spiriva in the past, cannot remember why it was stopped but  she may have had difficulty tolerating.  We will do a retrial.  If she has difficulty we can try changing to ICS/LABA.  Keep your albuterol available to  use if needed.  Her pulmonary function testing was done in 2015, can consider timing for repeat going forward.  Tobacco abuse She is motivated to quit.  Discussed this with her today.  She is going to call the quit line and get nicotine patches, try to set a quit date.   Levy Pupa, MD, PhD 01/06/2023, 10:34 AM [facility] [phone] or if no answer before 7:00PM call [phone] For any issues after 7:00PM please call eLink [phone]

## 2023-01-06 NOTE — Patient Instructions (Signed)
We reviewed your CT scan of the chest today We will plan to repeat a CT scan of the chest in late August 2024 to compare with priors. We will try starting Spiriva Respimat, 2 puffs once daily. Keep your albuterol available to use 2 puffs when needed for shortness of breath, chest tightness, wheezing. Congratulations on decreasing your cigarettes.  Keep up the good work.  Try to work towards quitting altogether. Follow Dr. Delton Coombes in August after your CT chest so we can review.

## 2023-01-06 NOTE — Assessment & Plan Note (Signed)
New right middle lobe pulmonary nodule noted on her screening CT 11/23/2022, appears to be in the airway, question endobronchial lesion versus mucous impaction, etc.  Discussed the differential diagnosis with her.  We will plan to repeat her CT chest at the 62-month mark.  If the nodule persists then would be reasonable for [place] to talk about either primary resection or diagnostics given her risk profile.  Would probably push for navigational bronchoscopy to get a tissue diagnosis before committing to surgery.  Her pulmonary function testing is consistent with mixed obstruction and restriction but she would probably be a reasonable surgical candidate.  We reviewed your CT scan of the chest today We will plan to repeat a CT scan of the chest in late August 2024 to compare with priors. Follow Dr. Delton Coombes in August after your CT chest so we can review.

## 2023-01-06 NOTE — Assessment & Plan Note (Signed)
She is motivated to quit.  Discussed this with her today.  She is going to call the quit line and get nicotine patches, try to set a quit date.

## 2023-01-14 ENCOUNTER — Other Ambulatory Visit: Payer: Self-pay | Admitting: Family Medicine

## 2023-01-14 DIAGNOSIS — F419 Anxiety disorder, unspecified: Secondary | ICD-10-CM

## 2023-01-19 ENCOUNTER — Other Ambulatory Visit: Payer: Self-pay | Admitting: Family Medicine

## 2023-01-19 DIAGNOSIS — F419 Anxiety disorder, unspecified: Secondary | ICD-10-CM

## 2023-02-11 ENCOUNTER — Other Ambulatory Visit: Payer: Self-pay | Admitting: Family Medicine

## 2023-02-13 ENCOUNTER — Other Ambulatory Visit: Payer: Self-pay | Admitting: Family Medicine

## 2023-02-13 DIAGNOSIS — H8113 Benign paroxysmal vertigo, bilateral: Secondary | ICD-10-CM

## 2023-02-13 DIAGNOSIS — E1159 Type 2 diabetes mellitus with other circulatory complications: Secondary | ICD-10-CM

## 2023-02-15 NOTE — Telephone Encounter (Signed)
Future OV scheduled.  Requested Prescriptions  Pending Prescriptions Disp Refills   metoprolol succinate (TOPROL-XL) 50 MG 24 hr tablet [Pharmacy Med Name: METOPROLOL ER SUCCINATE 50MG  TABS] 90 tablet 0    Sig: TAKE 1 TABLET(50 MG) BY MOUTH DAILY     Cardiovascular:  Beta Blockers Failed - 02/13/2023 11:44 AM      Failed - Valid encounter within last 6 months    Recent Outpatient Visits           8 months ago Smoking greater than 20 pack years   [facility] [place], Odette Horns, MD   1 year ago Dizziness   [place] [facility] [place], Odette Horns, MD   1 year ago Type 2 diabetes mellitus with other neurologic complication, with long-term current use of insulin [facility])   [place] [facility] [place], [place], [place]   1 year ago Type 2 diabetes mellitus with other neurologic complication, with long-term current use of insulin (HCC)   [facility] [place], [place], [place]   1 year ago Type 2 diabetes mellitus with other neurologic complication, with long-term current use of insulin (HCC)   [place] [facility] Hoy Register, MD       Future Appointments             In 1 week Hoy Register, MD [facility]   In 3 weeks Byrum, Les Pou, MD [place] [facility] - Last BP in normal range    BP Readings from Last 1 Encounters:  01/06/23 134/80         Passed - Last Heart Rate in normal range    Pulse Readings from Last 1 Encounters:  01/06/23 90          meclizine (ANTIVERT) 25 MG tablet [Pharmacy Med Name: MECLIZINE 25MG  RX TABLETS] 180 tablet 0    Sig: TAKE 1 TABLET(25 MG) BY MOUTH TWICE DAILY AS NEEDED FOR DIZZINESS     Not Delegated - Gastroenterology: Antiemetics Failed - 02/13/2023 11:44 AM      Failed - This refill cannot be delegated      Failed - Valid  encounter within last 6 months    Recent Outpatient Visits           8 months ago Smoking greater than 20 pack years   [place] [facility] Hoy Register, MD   1 year ago Dizziness   [place] [facility] [place], Odette Horns, MD   1 year ago Type 2 diabetes mellitus with other neurologic complication, with long-term current use of insulin [facility])   [place] [place], [place], [place]   1 year ago Type 2 diabetes mellitus with other neurologic complication, with long-term current use of insulin (HCC)   [place] [facility] [place], [place], [place]   1 year ago Type 2 diabetes mellitus with other neurologic complication, with long-term current use of insulin [facility])    [facility] Hoy Register, MD       Future Appointments             In 1 week Hoy Register, MD [facility]   In 3 weeks [place]  Kathie Rhodes, MD  [place] [facility]

## 2023-02-15 NOTE — Telephone Encounter (Signed)
Requested medication (s) are due for refill today: yes  Requested medication (s) are on the active medication list: yes  Last refill:  10/21/22  Future visit scheduled: yes  Notes to clinic:  Unable to refill per protocol, cannot delegate.      Requested Prescriptions  Pending Prescriptions Disp Refills   meclizine (ANTIVERT) 25 MG tablet [Pharmacy Med Name: MECLIZINE 25MG  RX TABLETS] 180 tablet 0    Sig: TAKE 1 TABLET(25 MG) BY MOUTH TWICE DAILY AS NEEDED FOR DIZZINESS     Not Delegated - Gastroenterology: Antiemetics Failed - 02/13/2023 11:44 AM      Failed - This refill cannot be delegated      Failed - Valid encounter within last 6 months    Recent Outpatient Visits           8 months ago Smoking greater than 20 pack years   [facility] [place], Odette Horns, MD   1 year ago Dizziness   [place] [facility] [place], Odette Horns, MD   1 year ago Type 2 diabetes mellitus with other neurologic complication, with long-term current use of insulin [facility])   [place] [place], [place], [place]   1 year ago Type 2 diabetes mellitus with other neurologic complication, with long-term current use of insulin (HCC)   [place] [facility] [place], [place], [place]   1 year ago Type 2 diabetes mellitus with other neurologic complication, with long-term current use of insulin (HCC)   [place] [facility] Hoy Register, MD       Future Appointments             In 1 week Hoy Register, MD [facility]   In 3 weeks Byrum, Les Pou, MD [place] [facility]            Signed Prescriptions Disp Refills   metoprolol succinate (TOPROL-XL) 50 MG 24 hr tablet 90 tablet 0    Sig: TAKE 1 TABLET(50 MG) BY MOUTH DAILY     Cardiovascular:  Beta Blockers Failed - 02/13/2023 11:44 AM      Failed -  Valid encounter within last 6 months    Recent Outpatient Visits           8 months ago Smoking greater than 20 pack years   [facility] [place], Odette Horns, MD   1 year ago Dizziness   [place] [facility], Odette Horns, MD   1 year ago Type 2 diabetes mellitus with other neurologic complication, with long-term current use of insulin [facility])   [place] [facility] [place], [place], [place]   1 year ago Type 2 diabetes mellitus with other neurologic complication, with long-term current use of insulin (HCC)   [place] [facility] [place], [place], MD   1 year ago Type 2 diabetes mellitus with other neurologic complication, with long-term current use of insulin [facility])    [facility] Hoy Register, MD       Future Appointments             In 1 week Hoy Register, MD [facility]   In 3 weeks Byrum, Les Pou, MD [facility] [place] [facility]  Passed - Last BP in normal range    BP Readings from Last 1 Encounters:  01/06/23 134/80         Passed - Last Heart Rate in normal range    Pulse Readings from Last 1 Encounters:  01/06/23 90

## 2023-02-22 ENCOUNTER — Ambulatory Visit: Payer: Medicaid Other | Admitting: Family Medicine

## 2023-02-23 ENCOUNTER — Ambulatory Visit
Admission: RE | Admit: 2023-02-23 | Discharge: 2023-02-23 | Disposition: A | Discharge status: Home / Self Care | Payer: Medicaid Other | Source: Ambulatory Visit | Attending: Emergency Medicine | Admitting: Emergency Medicine

## 2023-02-23 DIAGNOSIS — R911 Solitary pulmonary nodule: Secondary | ICD-10-CM

## 2023-03-01 ENCOUNTER — Telehealth: Payer: Self-pay

## 2023-03-01 ENCOUNTER — Other Ambulatory Visit: Payer: Self-pay | Admitting: Family Medicine

## 2023-03-01 DIAGNOSIS — K580 Irritable bowel syndrome with diarrhea: Secondary | ICD-10-CM

## 2023-03-01 NOTE — Progress Notes (Deleted)
Name: [name]  MRN/ DOB: [id number], 04/21/1961   Age/ Sex: 62 y.o., female    PCP: Hoy Register, MD   Reason for Endocrinology Evaluation: Type 2 Diabetes Mellitus     Date of Initial Endocrinology Visit: 12/19/2020    PATIENT IDENTIFIER: [name] is a 62 y.o. female with a past medical history of T2DM, HTN, COPD  , retinitis pigmentosa ,and CHF. The patient presented for initial endocrinology clinic visit on 12/19/2020 for consultative assistance with her diabetes management.    HPI: [name] was    Diagnosed with DM 2017 Prior Medications tried/Intolerance: Metformin - diarrhea, Trulicity - nausea                  Hemoglobin A1c has ranged from 9.0% in 2018, peaking at 13.4% in 2022.  On her initial visit to our clinic her A1c was 11.9% , she was on insulin mix and Glimepiride , which we stopped and started her on Trulicity and Lantus    SUBJECTIVE:   During the last visit (08/10/2021): A1c 13.9 %    Today (03/01/23): Ms. [name] is here for a follow up on diabetes management. She is accompanied by her daughter today . She checks her blood sugars 4-5  times daily through CGM. The patient has not had hypoglycemic episodes since the last clinic visit, occur sporadically at night    She denies nausea but has noted loose stools She recently had another COVID infection requiring prednisone intake I did prescribe OmniPod but she tells me it was stolen She has ingrown nails of the 2 great toes and have been bothersome to her  HOME DIABETES REGIMEN: Novolog 10 units TIDQAC Lantus 24 units daily CF: Novolog (BG -130/30)     Statin: yes ACE-I/ARB: Allergic to lisinopril - swelling  Prior Diabetic Education: no    CONTINUOUS GLUCOSE MONITORING RECORD INTERPRETATION    Dates of Recording: 9/30 - 04/09/2022  Sensor description: dexcom   Results statistics:   CGM use % of time 79  Average and SD 204/56  Time in range    36%  % Time Above  180 43  % Time above 250 20  % Time Below target <1     Glycemic patterns summary: Bg's optimal overnight, hyperglycemia noted postprandial   Hyperglycemic episodes postprandial  Hypoglycemic episodes and a over the past 2 weeks  Overnight periods: stable        DIABETIC COMPLICATIONS: Microvascular complications:  Neuropathy Denies: CKD,retinopathy  Last eye exam: 12/2020  Macrovascular complications:  CHF  Denies: CAD, PVD, CVA   PAST HISTORY: Past Medical History:  Past Medical History:  Diagnosis Date   Anxiety    Arthritis    Chronic systolic heart failure (HCC)    EF 20-25% ECHO 05/2015   COPD (chronic obstructive pulmonary disease) (HCC)    Depression    Diabetes mellitus without complication (HCC)    Type II   Dysrhythmia    pt unsure of name of arrythmia - " my heart rate will drop all of a sudden" -no current treatment - atrial flutter   Fibrillation, atrial (HCC)    Gallstones    GERD (gastroesophageal reflux disease)    Headache(784.0)    migraines   Hypertension    Osteomyelitis (HCC)    R ankle   Rash    arms   Past Surgical History:  Past Surgical History:  Procedure Laterality Date   ANKLE FRACTURE SURGERY Right 2015  CARDIAC CATHETERIZATION N/A 06/24/2015   Procedure: Left Heart Cath and Coronary Angiography;  Surgeon: [name], MD;  Location: [facility] INVASIVE CV LAB;  Service: Cardiovascular;  Laterality: N/A;   CARPAL TUNNEL RELEASE     bil   CHOLECYSTECTOMY  11/24/2011   Procedure: LAPAROSCOPIC CHOLECYSTECTOMY WITH INTRAOPERATIVE CHOLANGIOGRAM;  Surgeon: Clovis Pu. Cornett, MD;  Location: [facility] ORS;  Service: General;  Laterality: N/A;  Laparoscopic Cholecystectomy with Cholangiogram   COLONOSCOPY     ECTOPIC PREGNANCY SURGERY  many yrs ago   HARDWARE REMOVAL Right 07/15/2015   Procedure: RIGHT ANKLE HARDWARE REMOVAL, PLACEMENT OF STIMULAN ANTIBIOTIC BEADS AND PREVENA WOUND VAC.;  Surgeon: [name], MD;  Location: [facility] OR;   Service: Orthopedics;  Laterality: Right;   TOOTH EXTRACTION N/A 12/03/2016   Procedure: DENTAL EXTRACTIONS with Alveoloplasty;  Surgeon: Ocie Doyne, DDS;  [facility];  Service: Oral Surgery;  Laterality: N/A;    Social History:  reports that she has been smoking cigarettes and e-cigarettes. She started smoking about 6 years ago. She has a 20.1 pack-year smoking history. She has never used smokeless tobacco. She reports that she does not drink alcohol and does not use drugs. Family History:  Family History  Problem Relation Age of Onset   Diabetes Mother    Lung cancer Father    Heart attack Father 71       CABG x4   Breast cancer Sister        Survivor      HOME MEDICATIONS: Allergies as of 03/02/2023       Reactions   Biaxin [clarithromycin] Anaphylaxis, Swelling   Clarithromycin Shortness Of Breath, Swelling   Lisinopril Swelling, Cough   Lip edema   Sulfa Antibiotics Anaphylaxis, Shortness Of Breath, Swelling, Hypertension   Chlorthalidone Nausea And Vomiting, Other (See Comments)   Clammy, Tachycardia, Headache   Daptomycin Nausea And Vomiting   Latex Hives, Rash   Amoxicillin-pot Clavulanate Diarrhea   Has patient had a PCN reaction causing immediate rash, facial/tongue/throat swelling, SOB or lightheadedness with hypotension:No Has patient had a PCN reaction causing severe rash involving mucus membranes or skin necrosis:No Has patient had a PCN reaction that required hospitalization:No Has patient had a PCN reaction occurring within THE LAST 10 YEARS.  #  #  #  YES  #  #  #  If all of the above answers are "NO", then may proceed with Cephalosporin use.   Aspirin Nausea And Vomiting, Rash   On 325mg  dosage   Cholestyramine Nausea And Vomiting   Dilaudid [hydromorphone Hcl] Nausea And Vomiting   Lasix [furosemide] Nausea And Vomiting, Other (See Comments)   Headache         Medication List        Accurate as of March 01, 2023  1:47 PM. If you have any  questions, ask your nurse or doctor.          Accu-Chek Guide Me w/Device Kit Use to check blood sugar TID.   Accu-Chek Guide test strip Generic drug: glucose blood USE AS DIRECTED IN THE MORNING, AT NOON, IN THE EVENING AND AT BEDTIME   accu-chek softclix lancets Use as instructed daily.   Accu-Chek Softclix Lancets lancets Use to check blood sugar THREE TIMES DAILY   albuterol 108 (90 Base) MCG/ACT inhaler Commonly known as: Ventolin HFA Inhale 2 puffs into the lungs every 4 (four) hours as needed for wheezing or shortness of breath.   amitriptyline 50 MG tablet Commonly known as:  ELAVIL Take 1 tablet (50 mg total) by mouth at bedtime.   BD Pen Needle Nano 2nd Gen 32G X 4 MM Misc Generic drug: Insulin Pen Needle USE 1 PEN NEEDLE UNDER THE SKIN FOUR TIMES DAILY   cetirizine 10 MG tablet Commonly known as: ZYRTEC TAKE 1 TABLET(10 MG) BY MOUTH DAILY   Dexcom G6 Receiver Devi Use as directed   Dexcom G6 Sensor Misc USE 1 DEVICE AS DIRECTED   Dexcom G6 Transmitter Misc USE AS DIRECTED   dicyclomine 10 MG capsule Commonly known as: BENTYL TAKE ONE CAPSULE BY MOUTH THREE TIMES DAILY BEFORE MEALS   diphenoxylate-atropine 2.5-0.025 MG tablet Commonly known as: LOMOTIL TAKE 1 TABLET BY MOUTH TWICE DAILY   ergocalciferol 1.25 MG (50000 UT) capsule Commonly known as: Drisdol Take 1 capsule (50,000 Units total) by mouth once a week. What changed:  how much to take when to take this   fenofibrate 145 MG tablet Commonly known as: TRICOR TAKE ONE TABLET BY MOUTH EVERY DAY   FLUoxetine 40 MG capsule Commonly known as: PROZAC TAKE ONE CAPSULE BY MOUTH AT BEDTIME   hydrOXYzine 10 MG tablet Commonly known as: ATARAX TAKE ONE TABLET BY MOUTH THREE TIMES DAILY AS NEEDED   Lantus SoloStar 100 UNIT/ML Solostar Pen Generic drug: insulin glargine INJECT 24 UNITS UNDER THE SKIN DAILY   meclizine 25 MG tablet Commonly known as: ANTIVERT TAKE 1 TABLET(25 MG) BY MOUTH  TWICE DAILY AS NEEDED FOR DIZZINESS   metoprolol succinate 50 MG 24 hr tablet Commonly known as: TOPROL-XL TAKE 1 TABLET(50 MG) BY MOUTH DAILY   mupirocin ointment 2 % Commonly known as: BACTROBAN Apply 1 Application topically 2 (two) times daily.   NovoLOG FlexPen 100 UNIT/ML FlexPen Generic drug: insulin aspart Max Daily 50 units per correction scale   omeprazole 40 MG capsule Commonly known as: PRILOSEC TAKE 1 CAPSULE(40 MG) BY MOUTH DAILY   Omnipod 5 G6 Pods (Gen 5) Misc USE 1 DEVICE EVERY 3 DAYS   pregabalin 150 MG capsule Commonly known as: LYRICA TAKE 1 CAPSULE(150 MG) BY MOUTH TWICE DAILY   promethazine 25 MG tablet Commonly known as: PHENERGAN 1/2-1 tab every 4 to 6 hours prn nausea   rosuvastatin 10 MG tablet Commonly known as: CRESTOR Take 1 tablet (10 mg total) by mouth daily.   Spiriva Respimat 1.25 MCG/ACT Aers Generic drug: Tiotropium Bromide Monohydrate Inhale 2 puffs into the lungs daily.   Spiriva Respimat 1.25 MCG/ACT Aers Generic drug: Tiotropium Bromide Monohydrate Inhale 2 puffs into the lungs daily.         ALLERGIES: Allergies  Allergen Reactions   Biaxin [Clarithromycin] Anaphylaxis and Swelling   Clarithromycin Shortness Of Breath and Swelling   Lisinopril Swelling and Cough    Lip edema   Sulfa Antibiotics Anaphylaxis, Shortness Of Breath, Swelling and Hypertension   Chlorthalidone Nausea And Vomiting and Other (See Comments)    Clammy, Tachycardia, Headache    Daptomycin Nausea And Vomiting   Latex Hives and Rash   Amoxicillin-Pot Clavulanate Diarrhea    Has patient had a PCN reaction causing immediate rash, facial/tongue/throat swelling, SOB or lightheadedness with hypotension:No Has patient had a PCN reaction causing severe rash involving mucus membranes or skin necrosis:No Has patient had a PCN reaction that required hospitalization:No Has patient had a PCN reaction occurring within THE LAST 10 YEARS.  #  #  #  YES  #  #  #   If all of the above answers are "NO", then may proceed  with Cephalosporin use.    Aspirin Nausea And Vomiting and Rash    On 325mg  dosage   Cholestyramine Nausea And Vomiting   Dilaudid [Hydromorphone Hcl] Nausea And Vomiting   Lasix [Furosemide] Nausea And Vomiting and Other (See Comments)    Headache        OBJECTIVE:   VITAL SIGNS: LMP 06/09/2011 Comment: verified BEFORE imaging   PHYSICAL EXAM:  General: Pt appears well and is in NAD  Lungs: Scattered rhonchi  Heart: RRR  Abdomen: soft, nontender  Extremities:  Lower extremities - No pretibial edema.  Neuro: MS is good with appropriate affect, pt is alert and Ox3   DM foot exam 04/09/2022  The skin of the feet is intact without sores or ulcerations. The pedal pulses are 2+ on right and 2+ on left. The sensation is intact to a screening 5.07, 10 gram monofilament bilaterally   DATA REVIEWED:  Lab Results  Component Value Date   HGBA1C 8.4 (H) 06/03/2022   HGBA1C 8.3 (A) 04/09/2022   HGBA1C 9.2 (A) 10/08/2021   Lab Results  Component Value Date   LDLCALC 82 06/03/2022   CREATININE 0.67 06/03/2022   Lab Results  Component Value Date   MICRALBCREAT 11 06/03/2022    Lab Results  Component Value Date   CHOL 142 06/03/2022   HDL 43 06/03/2022   LDLCALC 82 06/03/2022   TRIG 87 06/03/2022   CHOLHDL 5.6 (H) 07/31/2019        ASSESSMENT / PLAN / RECOMMENDATIONS:   1) Type 2 Diabetes Mellitus, Poorly controlled, With neuropathic  complications - Most recent A1c of 8.3 %. Goal A1c < 7.0 %.    -I have praised the patient on continued glycemic improvement, A1c down from 9.2% to 8.3% -Intolerant to Trulicity 0.75 mg weekly -I prescribed OmniPod for her, but the patient tells me this was stolen, I will refill this again, I will refer her to our CDE for training -She was also provided with a pamphlet to train her pump prior to training -In review on her CGM data, patient has been noted with postprandial  hyperglycemia, will increase NovoLog as below  MEDICATIONS: Continue Lantus 24 units once daily  Increase NovoLog  12 units 3 times daily before every meal Continue correction factor: NovoLog (BG -130/30)   EDUCATION / INSTRUCTIONS: BG monitoring instructions: Patient is instructed to check her blood sugars 1 times a day, fasting. Call [place] [facility] if: BG persistently < 70  I reviewed the Rule of 15 for the treatment of hypoglycemia in detail with the patient. Literature supplied.   2) Diabetic complications:  Eye: Does not have known diabetic retinopathy.  Neuro/ Feet: Does  have known diabetic peripheral neuropathy. Renal: Patient does not have known baseline CKD. She is not on an ACEI/ARB at present.   4) ingrown Toe nail   -Referred to podiatry for further management  F/U in 4 months    Signed electronically by: Lyndle Herrlich, MD  [facility]  [facility] [address] [place], [place] [address] Phone: [phone] FAX: [phone]   CC: Hoy Register, MD [address] [place] [address] Phone: [phone]  Fax: [phone]    Return to Endocrinology clinic as below: Future Appointments  Date Time Provider Department Center  03/02/2023 11:50 AM Jesi Jurgens, Konrad Dolores, MD [facility] None  03/10/2023  1:30 PM Leslye Peer, MD [facility] None  03/16/2023  2:10 PM Hoy Register, MD [facility] None  04/21/2023  1:45 PM Windell Norfolk, MD [facility] None

## 2023-03-01 NOTE — Telephone Encounter (Signed)
Medication Refill - Medication:  diphenoxylate-atropine (LOMOTIL) 2.5-0.025 MG tablet fenofibrate (TRICOR) 145 MG tablet  Has the patient contacted their pharmacy? No.   Preferred Pharmacy (with phone number or street name):  [id number] - [place], [place] - [address] AT [facility] [address] Phone: [phone]  Fax: [phone]     Has the patient been seen for an appointment in the last year OR does the patient have an upcoming appointment? Yes.    Agent: Please be advised that RX refills may take up to 3 business days. We ask that you follow-up with your pharmacy.

## 2023-03-01 NOTE — Telephone Encounter (Signed)
Contact patient to reschedule appointment for tomorrow.

## 2023-03-02 ENCOUNTER — Other Ambulatory Visit: Payer: Self-pay | Admitting: Family Medicine

## 2023-03-02 ENCOUNTER — Ambulatory Visit: Payer: Medicaid Other | Admitting: Internal Medicine

## 2023-03-02 ENCOUNTER — Encounter: Payer: Self-pay | Admitting: Internal Medicine

## 2023-03-02 DIAGNOSIS — H8113 Benign paroxysmal vertigo, bilateral: Secondary | ICD-10-CM

## 2023-03-02 NOTE — Progress Notes (Deleted)
Name: [name]  MRN/ DOB: [id number], Oct 27, 1960   Age/ Sex: 62 y.o., female    PCP: Hoy Register, MD   Reason for Endocrinology Evaluation: Type 2 Diabetes Mellitus     Date of Initial Endocrinology Visit: 12/19/2020    PATIENT IDENTIFIER: [name] is a 62 y.o. female with a past medical history of T2DM, HTN, COPD  , retinitis pigmentosa ,and CHF. The patient presented for initial endocrinology clinic visit on 12/19/2020 for consultative assistance with her diabetes management.    HPI: [name] was    Diagnosed with DM 2017 Prior Medications tried/Intolerance: Metformin - diarrhea, Trulicity - nausea                  Hemoglobin A1c has ranged from 9.0% in 2018, peaking at 13.4% in 2022.  On her initial visit to our clinic her A1c was 11.9% , she was on insulin mix and Glimepiride , which we stopped and started her on Trulicity and Lantus     Intolerant to Trulicity 0.75 mg 2023  An OmniPod prescription was sent in 2023 but patient stated this was stolen  SUBJECTIVE:   During the last visit (04/09/2022): A1c 8.3 %    Today (03/02/23): [name] is here for a follow up on diabetes management. She is accompanied by her daughter today . She checks her blood sugars 4-5  times daily through CGM. The patient has not had hypoglycemic episodes since the last clinic visit, occur sporadically at night    I did prescribe OmniPod but she tells me it was stolen She has ingrown nails of the 2 great toes and have been bothersome to her  HOME DIABETES REGIMEN: Novolog 12 units TIDQAC Lantus 24 units daily CF: Novolog (BG -130/30)     Statin: yes ACE-I/ARB: Allergic to lisinopril - swelling  Prior Diabetic Education: no    CONTINUOUS GLUCOSE MONITORING RECORD INTERPRETATION    Dates of Recording: 9/30 - 04/09/2022  Sensor description: dexcom   Results statistics:   CGM use % of time 79  Average and SD 204/56  Time in range    36%  % Time  Above 180 43  % Time above 250 20  % Time Below target <1     Glycemic patterns summary: Bg's optimal overnight, hyperglycemia noted postprandial   Hyperglycemic episodes postprandial  Hypoglycemic episodes and a over the past 2 weeks  Overnight periods: stable        DIABETIC COMPLICATIONS: Microvascular complications:  Neuropathy Denies: CKD,retinopathy  Last eye exam: 12/2020  Macrovascular complications:  CHF  Denies: CAD, PVD, CVA   PAST HISTORY: Past Medical History:  Past Medical History:  Diagnosis Date   Anxiety    Arthritis    Chronic systolic heart failure (HCC)    EF 20-25% ECHO 05/2015   COPD (chronic obstructive pulmonary disease) (HCC)    Depression    Diabetes mellitus without complication (HCC)    Type II   Dysrhythmia    pt unsure of name of arrythmia - " my heart rate will drop all of a sudden" -no current treatment - atrial flutter   Fibrillation, atrial (HCC)    Gallstones    GERD (gastroesophageal reflux disease)    Headache(784.0)    migraines   Hypertension    Osteomyelitis (HCC)    R ankle   Rash    arms   Past Surgical History:  Past Surgical History:  Procedure Laterality Date   ANKLE FRACTURE  SURGERY Right 2015   CARDIAC CATHETERIZATION N/A 06/24/2015   Procedure: Left Heart Cath and Coronary Angiography;  Surgeon: [name], MD;  Location[facility] INVASIVE CV LAB;  Service: Cardiovascular;  Laterality: N/A;   CARPAL TUNNEL RELEASE     bil   CHOLECYSTECTOMY  11/24/2011   Procedure: LAPAROSCOPIC CHOLECYSTECTOMY WITH INTRAOPERATIVE CHOLANGIOGRAM;  Surgeon: Clovis Pu. Cornett, MD;  Location: [facility] ORS;  Service: General;  Laterality: N/A;  Laparoscopic Cholecystectomy with Cholangiogram   COLONOSCOPY     ECTOPIC PREGNANCY SURGERY  many yrs ago   HARDWARE REMOVAL Right 07/15/2015   Procedure: RIGHT ANKLE HARDWARE REMOVAL, PLACEMENT OF STIMULAN ANTIBIOTIC BEADS AND PREVENA WOUND VAC.;  Surgeon: [name], MD;  Location: [facility]  OR;  Service: Orthopedics;  Laterality: Right;   TOOTH EXTRACTION N/A 12/03/2016   Procedure: DENTAL EXTRACTIONS with Alveoloplasty;  Surgeon: Ocie Doyne, DDS;  Location: [facility] OR;  Service: Oral Surgery;  Laterality: N/A;    Social History:  reports that she has been smoking cigarettes and e-cigarettes. She started smoking about 6 years ago. She has a 20.1 pack-year smoking history. She has never used smokeless tobacco. She reports that she does not drink alcohol and does not use drugs. Family History:  Family History  Problem Relation Age of Onset   Diabetes Mother    Lung cancer Father    Heart attack Father 62       CABG x4   Breast cancer Sister        Survivor      HOME MEDICATIONS: Allergies as of 03/02/2023       Reactions   Biaxin [clarithromycin] Anaphylaxis, Swelling   Clarithromycin Shortness Of Breath, Swelling   Lisinopril Swelling, Cough   Lip edema   Sulfa Antibiotics Anaphylaxis, Shortness Of Breath, Swelling, Hypertension   Chlorthalidone Nausea And Vomiting, Other (See Comments)   Clammy, Tachycardia, Headache   Daptomycin Nausea And Vomiting   Latex Hives, Rash   Amoxicillin-pot Clavulanate Diarrhea   Has patient had a PCN reaction causing immediate rash, facial/tongue/throat swelling, SOB or lightheadedness with hypotension:No Has patient had a PCN reaction causing severe rash involving mucus membranes or skin necrosis:No Has patient had a PCN reaction that required hospitalization:No Has patient had a PCN reaction occurring within THE LAST 10 YEARS.  #  #  #  YES  #  #  #  If all of the above answers are "NO", then may proceed with Cephalosporin use.   Aspirin Nausea And Vomiting, Rash   On 325mg  dosage   Cholestyramine Nausea And Vomiting   Dilaudid [hydromorphone Hcl] Nausea And Vomiting   Lasix [furosemide] Nausea And Vomiting, Other (See Comments)   Headache         Medication List        Accurate as of March 02, 2023  7:19 AM. If you have  any questions, ask your nurse or doctor.          Accu-Chek Guide Me w/Device Kit Use to check blood sugar TID.   Accu-Chek Guide test strip Generic drug: glucose blood USE AS DIRECTED IN THE MORNING, AT NOON, IN THE EVENING AND AT BEDTIME   accu-chek softclix lancets Use as instructed daily.   Accu-Chek Softclix Lancets lancets Use to check blood sugar THREE TIMES DAILY   albuterol 108 (90 Base) MCG/ACT inhaler Commonly known as: Ventolin HFA Inhale 2 puffs into the lungs every 4 (four) hours as needed for wheezing or shortness of breath.   amitriptyline 50  MG tablet Commonly known as: ELAVIL Take 1 tablet (50 mg total) by mouth at bedtime.   BD Pen Needle Nano 2nd Gen 32G X 4 MM Misc Generic drug: Insulin Pen Needle USE 1 PEN NEEDLE UNDER THE SKIN FOUR TIMES DAILY   cetirizine 10 MG tablet Commonly known as: ZYRTEC TAKE 1 TABLET(10 MG) BY MOUTH DAILY   Dexcom G6 Receiver Devi Use as directed   Dexcom G6 Sensor Misc USE 1 DEVICE AS DIRECTED   Dexcom G6 Transmitter Misc USE AS DIRECTED   dicyclomine 10 MG capsule Commonly known as: BENTYL TAKE ONE CAPSULE BY MOUTH THREE TIMES DAILY BEFORE MEALS   diphenoxylate-atropine 2.5-0.025 MG tablet Commonly known as: LOMOTIL TAKE 1 TABLET BY MOUTH TWICE DAILY   ergocalciferol 1.25 MG (50000 UT) capsule Commonly known as: Drisdol Take 1 capsule (50,000 Units total) by mouth once a week. What changed:  how much to take when to take this   fenofibrate 145 MG tablet Commonly known as: TRICOR TAKE ONE TABLET BY MOUTH EVERY DAY   FLUoxetine 40 MG capsule Commonly known as: PROZAC TAKE ONE CAPSULE BY MOUTH AT BEDTIME   hydrOXYzine 10 MG tablet Commonly known as: ATARAX TAKE ONE TABLET BY MOUTH THREE TIMES DAILY AS NEEDED   Lantus SoloStar 100 UNIT/ML Solostar Pen Generic drug: insulin glargine INJECT 24 UNITS UNDER THE SKIN DAILY   meclizine 25 MG tablet Commonly known as: ANTIVERT TAKE 1 TABLET(25 MG) BY  MOUTH TWICE DAILY AS NEEDED FOR DIZZINESS   metoprolol succinate 50 MG 24 hr tablet Commonly known as: TOPROL-XL TAKE 1 TABLET(50 MG) BY MOUTH DAILY   mupirocin ointment 2 % Commonly known as: BACTROBAN Apply 1 Application topically 2 (two) times daily.   NovoLOG FlexPen 100 UNIT/ML FlexPen Generic drug: insulin aspart Max Daily 50 units per correction scale   omeprazole 40 MG capsule Commonly known as: PRILOSEC TAKE 1 CAPSULE(40 MG) BY MOUTH DAILY   Omnipod 5 G6 Pods (Gen 5) Misc USE 1 DEVICE EVERY 3 DAYS   pregabalin 150 MG capsule Commonly known as: LYRICA TAKE 1 CAPSULE(150 MG) BY MOUTH TWICE DAILY   promethazine 25 MG tablet Commonly known as: PHENERGAN 1/2-1 tab every 4 to 6 hours prn nausea   rosuvastatin 10 MG tablet Commonly known as: CRESTOR Take 1 tablet (10 mg total) by mouth daily.   Spiriva Respimat 1.25 MCG/ACT Aers Generic drug: Tiotropium Bromide Monohydrate Inhale 2 puffs into the lungs daily.   Spiriva Respimat 1.25 MCG/ACT Aers Generic drug: Tiotropium Bromide Monohydrate Inhale 2 puffs into the lungs daily.         ALLERGIES: Allergies  Allergen Reactions   Biaxin [Clarithromycin] Anaphylaxis and Swelling   Clarithromycin Shortness Of Breath and Swelling   Lisinopril Swelling and Cough    Lip edema   Sulfa Antibiotics Anaphylaxis, Shortness Of Breath, Swelling and Hypertension   Chlorthalidone Nausea And Vomiting and Other (See Comments)    Clammy, Tachycardia, Headache    Daptomycin Nausea And Vomiting   Latex Hives and Rash   Amoxicillin-Pot Clavulanate Diarrhea    Has patient had a PCN reaction causing immediate rash, facial/tongue/throat swelling, SOB or lightheadedness with hypotension:No Has patient had a PCN reaction causing severe rash involving mucus membranes or skin necrosis:No Has patient had a PCN reaction that required hospitalization:No Has patient had a PCN reaction occurring within THE LAST 10 YEARS.  #  #  #  YES  #   #  #  If all of the above answers  are "NO", then may proceed with Cephalosporin use.    Aspirin Nausea And Vomiting and Rash    On 325mg  dosage   Cholestyramine Nausea And Vomiting   Dilaudid [Hydromorphone Hcl] Nausea And Vomiting   Lasix [Furosemide] Nausea And Vomiting and Other (See Comments)    Headache        OBJECTIVE:   VITAL SIGNS: LMP 06/09/2011 Comment: verified BEFORE imaging   PHYSICAL EXAM:  General: Pt appears well and is in NAD  Lungs: Scattered rhonchi  Heart: RRR  Abdomen: soft, nontender  Extremities:  Lower extremities - No pretibial edema.  Neuro: MS is good with appropriate affect, pt is alert and Ox3   DM foot exam 04/09/2022  The skin of the feet is intact without sores or ulcerations. The pedal pulses are 2+ on right and 2+ on left. The sensation is intact to a screening 5.07, 10 gram monofilament bilaterally   DATA REVIEWED:  Lab Results  Component Value Date   HGBA1C 8.4 (H) 06/03/2022   HGBA1C 8.3 (A) 04/09/2022   HGBA1C 9.2 (A) 10/08/2021   Lab Results  Component Value Date   LDLCALC 82 06/03/2022   CREATININE 0.67 06/03/2022   Lab Results  Component Value Date   MICRALBCREAT 11 06/03/2022    Lab Results  Component Value Date   CHOL 142 06/03/2022   HDL 43 06/03/2022   LDLCALC 82 06/03/2022   TRIG 87 06/03/2022   CHOLHDL 5.6 (H) 07/31/2019        ASSESSMENT / PLAN / RECOMMENDATIONS:   1) Type 2 Diabetes Mellitus, Poorly controlled, With neuropathic  complications - Most recent A1c of 8.3 %. Goal A1c < 7.0 %.    -I have praised the patient on continued glycemic improvement, A1c down from 9.2% to 8.3% -Intolerant to Trulicity 0.75 mg weekly -I prescribed OmniPod for her, but the patient tells me this was stolen, I will refill this again, I will refer her to our CDE for training -She was also provided with a pamphlet to train her pump prior to training -In review on her CGM data, patient has been noted with postprandial  hyperglycemia, will increase NovoLog as below  MEDICATIONS: Continue Lantus 24 units once daily  Increase NovoLog  12 units 3 times daily before every meal Continue correction factor: NovoLog (BG -130/30)   EDUCATION / INSTRUCTIONS: BG monitoring instructions: Patient is instructed to check her blood sugars 1 times a day, fasting. Call [facility] if: BG persistently < 70  I reviewed the Rule of 15 for the treatment of hypoglycemia in detail with the patient. Literature supplied.   2) Diabetic complications:  Eye: Does not have known diabetic retinopathy.  Neuro/ Feet: Does  have known diabetic peripheral neuropathy. Renal: Patient does not have known baseline CKD. She is not on an ACEI/ARB at present.    F/U in 4 months    Signed electronically by: Lyndle Herrlich, MD  [facility] Endocrinology  [facility] [address] [place], [place] [address] Phone: [phone] FAX: [phone]   CC: Hoy Register, MD [address] [place] [address] [place] [address] Phone: [phone]  Fax: [phone]    Return to Endocrinology clinic as below: Future Appointments  Date Time Provider Department Center  03/02/2023 11:50 AM Shanequa Whitenight, Konrad Dolores, MD [facility] None  03/10/2023  1:30 PM Leslye Peer, MD [facility] None  03/16/2023  2:10 PM Hoy Register, MD [facility] None  04/21/2023  1:45 PM Windell Norfolk,  MD [facility] None

## 2023-03-03 NOTE — Telephone Encounter (Signed)
Requested Prescriptions  Pending Prescriptions Disp Refills   diphenoxylate-atropine (LOMOTIL) 2.5-0.025 MG tablet 180 tablet 1    Sig: Take 1 tablet by mouth 2 (two) times daily.     Not Delegated - Gastroenterology:  Antidiarrheals Failed - 03/01/2023 11:52 AM      Failed - This refill cannot be delegated      Passed - Valid encounter within last 12 months    Recent Outpatient Visits           9 months ago Smoking greater than 20 pack years   [facility] Hoy Register, MD   1 year ago Dizziness   [place] [facility] [place], Odette Horns, MD   1 year ago Type 2 diabetes mellitus with other neurologic complication, with long-term current use of insulin [facility])   [place] [facility] [place], [place], [place]   1 year ago Type 2 diabetes mellitus with other neurologic complication, with long-term current use of insulin (HCC)   [place] [facility] [place], Odette Horns, [place]   1 year ago Type 2 diabetes mellitus with other neurologic complication, with long-term current use of insulin [facility])   [place] [facility] Hoy Register, MD       Future Appointments             In 1 week Byrum, Les Pou, MD [place] [facility] [place]   In 1 week Hoy Register, MD [facility]            Refused Prescriptions Disp Refills   fenofibrate (TRICOR) 145 MG tablet 90 tablet 0    Sig: Take 1 tablet (145 mg total) by mouth daily.     Cardiovascular:  Antilipid - Fibric Acid Derivatives Failed - 03/01/2023 11:52 AM      Failed - Lipid Panel in normal range within the last 12 months    Cholesterol, Total  Date Value Ref Range Status  06/03/2022 142 100 - 199 mg/dL Final   LDL Chol Calc (NIH)  Date Value Ref Range Status  06/03/2022 82 0 - 99 mg/dL Final   HDL  Date Value Ref Range Status  06/03/2022 43 >39  mg/dL Final   Triglycerides  Date Value Ref Range Status  06/03/2022 87 0 - 149 mg/dL Final         Passed - ALT in normal range and within 360 days    ALT  Date Value Ref Range Status  06/03/2022 8 0 - 32 IU/L Final         Passed - AST in normal range and within 360 days    AST  Date Value Ref Range Status  06/03/2022 10 0 - 40 IU/L Final         Passed - Cr in normal range and within 360 days    Creat  Date Value Ref Range Status  07/05/2016 0.59 0.50 - 1.05 mg/dL Final    Comment:      For patients > or = 61 years of age: The upper reference limit for Creatinine is approximately 13% higher for people identified as African-American.      Creatinine, Ser  Date Value Ref Range Status  06/03/2022 0.67 0.57 - 1.00 mg/dL Final         Passed - HGB in normal range and within 360 days    Hemoglobin  Date  Value Ref Range Status  04/26/2022 14.1 11.7 - 15.5 g/dL Final  03/47/4259 56.3 11.1 - 15.9 g/dL Final         Passed - HCT in normal range and within 360 days    HCT  Date Value Ref Range Status  04/26/2022 42.7 35.0 - 45.0 % Final   Hematocrit  Date Value Ref Range Status  11/25/2021 45.1 34.0 - 46.6 % Final         Passed - PLT in normal range and within 360 days    Platelets  Date Value Ref Range Status  04/26/2022 164 140 - 400 Thousand/uL Final  11/25/2021 190 150 - 450 x10E3/uL Final         Passed - WBC in normal range and within 360 days    WBC  Date Value Ref Range Status  04/26/2022 9.4 3.8 - 10.8 Thousand/uL Final         Passed - eGFR is 30 or above and within 360 days    GFR, Est African American  Date Value Ref Range Status  07/05/2016 >89 >=60 mL/min Final   GFR calc Af Amer  Date Value Ref Range Status  07/11/2020 114 >59 mL/min/1.73 Final    Comment:    **In accordance with recommendations from the NKF-ASN Task force,**   [facility] is in the process of updating its eGFR calculation to the   2021 CKD-EPI creatinine equation that  estimates kidney function   without a race variable.    GFR, Est Non African American  Date Value Ref Range Status  07/05/2016 >89 >=60 mL/min Final   GFR, Estimated  Date Value Ref Range Status  11/05/2021 >60 >60 mL/min Final    Comment:    (NOTE) Calculated using the CKD-EPI Creatinine Equation (2021)    eGFR  Date Value Ref Range Status  06/03/2022 100 >59 mL/min/1.73 Final         Passed - Valid encounter within last 12 months    Recent Outpatient Visits           9 months ago Smoking greater than 20 pack years   [facility] [place], Odette Horns, [place]   1 year ago Dizziness   [place] [facility] [place], Odette Horns, MD   1 year ago Type 2 diabetes mellitus with other neurologic complication, with long-term current use of insulin [facility])   [place] [facility] [place], [place], [place]   1 year ago Type 2 diabetes mellitus with other neurologic complication, with long-term current use of insulin (HCC)   [place] [facility] [place], Odette Horns, [place]   1 year ago Type 2 diabetes mellitus with other neurologic complication, with long-term current use of insulin [facility])   [place] [facility] Hoy Register, MD       Future Appointments             In 1 week Byrum, Les Pou, MD  [place] [facility] [place]   In 1 week Hoy Register, MD [facility]

## 2023-03-03 NOTE — Telephone Encounter (Signed)
Requested medications are due for refill today.  no  Requested medications are on the active medications list.  yes  Last refill. 10/22/2022 #180 1 rf  Future visit scheduled.   yes  Notes to clinic.  Refill/refusal not delegated.    Requested Prescriptions  Pending Prescriptions Disp Refills   diphenoxylate-atropine (LOMOTIL) 2.5-0.025 MG tablet 180 tablet 1    Sig: Take 1 tablet by mouth 2 (two) times daily.     Not Delegated - Gastroenterology:  Antidiarrheals Failed - 03/01/2023 11:52 AM      Failed - This refill cannot be delegated      Passed - Valid encounter within last 12 months    Recent Outpatient Visits           9 months ago Smoking greater than 20 pack years   [facility] Hoy Register, MD   1 year ago Dizziness   [facility] [place], Odette Horns, MD   1 year ago Type 2 diabetes mellitus with other neurologic complication, with long-term current use of insulin [facility])   [facility] [place], [place], [place]   1 year ago Type 2 diabetes mellitus with other neurologic complication, with long-term current use of insulin (HCC)   [facility] [place], Odette Horns, MD   1 year ago Type 2 diabetes mellitus with other neurologic complication, with long-term current use of insulin [facility])   [facility] Hoy Register, MD       Future Appointments             In 1 week Byrum, Les Pou, MD  [facility] [place]   In 1 week Hoy Register, MD [facility]            Refused Prescriptions Disp Refills   fenofibrate (TRICOR) 145 MG tablet 90 tablet 0    Sig: Take 1 tablet (145 mg total) by mouth daily.     Cardiovascular:  Antilipid - Fibric Acid Derivatives Failed - 03/01/2023 11:52 AM      Failed - Lipid Panel in normal range within the last 12  months    Cholesterol, Total  Date Value Ref Range Status  06/03/2022 142 100 - 199 mg/dL Final   LDL Chol Calc (NIH)  Date Value Ref Range Status  06/03/2022 82 0 - 99 mg/dL Final   HDL  Date Value Ref Range Status  06/03/2022 43 >39 mg/dL Final   Triglycerides  Date Value Ref Range Status  06/03/2022 87 0 - 149 mg/dL Final         Passed - ALT in normal range and within 360 days    ALT  Date Value Ref Range Status  06/03/2022 8 0 - 32 IU/L Final         Passed - AST in normal range and within 360 days    AST  Date Value Ref Range Status  06/03/2022 10 0 - 40 IU/L Final         Passed - Cr in normal range and within 360 days    Creat  Date Value Ref Range Status  07/05/2016 0.59 0.50 - 1.05 mg/dL Final    Comment:      For patients > or = 61 years of age: The upper reference limit for Creatinine is approximately 13% higher for people identified as African-American.  Creatinine, Ser  Date Value Ref Range Status  06/03/2022 0.67 0.57 - 1.00 mg/dL Final         Passed - HGB in normal range and within 360 days    Hemoglobin  Date Value Ref Range Status  04/26/2022 14.1 11.7 - 15.5 g/dL Final  47/82/9562 13.0 11.1 - 15.9 g/dL Final         Passed - HCT in normal range and within 360 days    HCT  Date Value Ref Range Status  04/26/2022 42.7 35.0 - 45.0 % Final   Hematocrit  Date Value Ref Range Status  11/25/2021 45.1 34.0 - 46.6 % Final         Passed - PLT in normal range and within 360 days    Platelets  Date Value Ref Range Status  04/26/2022 164 140 - 400 Thousand/uL Final  11/25/2021 190 150 - 450 x10E3/uL Final         Passed - WBC in normal range and within 360 days    WBC  Date Value Ref Range Status  04/26/2022 9.4 3.8 - 10.8 Thousand/uL Final         Passed - eGFR is 30 or above and within 360 days    GFR, Est African American  Date Value Ref Range Status  07/05/2016 >89 >=60 mL/min Final   GFR calc Af Amer  Date Value Ref  Range Status  07/11/2020 114 >59 mL/min/1.73 Final    Comment:    **In accordance with recommendations from the NKF-ASN Task force,**   [facility] is in the process of updating its eGFR calculation to the   2021 CKD-EPI creatinine equation that estimates kidney function   without a race variable.    GFR, Est Non African American  Date Value Ref Range Status  07/05/2016 >89 >=60 mL/min Final   GFR, Estimated  Date Value Ref Range Status  11/05/2021 >60 >60 mL/min Final    Comment:    (NOTE) Calculated using the CKD-EPI Creatinine Equation (2021)    eGFR  Date Value Ref Range Status  06/03/2022 100 >59 mL/min/1.73 Final         Passed - Valid encounter within last 12 months    Recent Outpatient Visits           9 months ago Smoking greater than 20 pack years   [facility] [place], Odette Horns, MD   1 year ago Dizziness   [facility] [place], Odette Horns, MD   1 year ago Type 2 diabetes mellitus with other neurologic complication, with long-term current use of insulin [facility])   [facility] [place], [place], [place]   1 year ago Type 2 diabetes mellitus with other neurologic complication, with long-term current use of insulin (HCC)   [facility] [place], Odette Horns, MD   1 year ago Type 2 diabetes mellitus with other neurologic complication, with long-term current use of insulin [facility])   [place] [facility] Hoy Register, MD       Future Appointments             In 1 week Byrum, Les Pou, MD [facility] [place]   In 1 week Hoy Register, MD [facility]

## 2023-03-04 MED ORDER — DIPHENOXYLATE-ATROPINE 2.5-0.025 MG PO TABS: 1.0000 | ORAL_TABLET | Freq: Two times a day (BID) | ORAL | 1 refills | Status: DC

## 2023-03-10 ENCOUNTER — Encounter: Payer: Self-pay | Admitting: Emergency Medicine

## 2023-03-10 ENCOUNTER — Ambulatory Visit (INDEPENDENT_AMBULATORY_CARE_PROVIDER_SITE_OTHER): Payer: Medicaid Other | Admitting: Emergency Medicine

## 2023-03-10 VITALS — BP 126/68 | HR 92 | Temp 98.3°F | Ht 67.0 in | Wt 158.2 lb

## 2023-03-10 DIAGNOSIS — R911 Solitary pulmonary nodule: Secondary | ICD-10-CM | POA: Diagnosis not present

## 2023-03-10 DIAGNOSIS — Z720 Tobacco use: Secondary | ICD-10-CM | POA: Diagnosis not present

## 2023-03-10 DIAGNOSIS — J449 Chronic obstructive pulmonary disease, unspecified: Secondary | ICD-10-CM

## 2023-03-10 DIAGNOSIS — J410 Simple chronic bronchitis: Secondary | ICD-10-CM

## 2023-03-10 NOTE — Assessment & Plan Note (Signed)
Suspect significant COPD based on the emphysema on her CT chest and her symptoms.  She had pulmonary function testing in 2015 that would support the diagnosis.  She has benefited from restarting Spiriva.  We will recheck her PFT to look for interval change in her obstruction.  Order albuterol for her to use as needed.  Discussed the flu shot, COVID-19 vaccine with her today.

## 2023-03-10 NOTE — Assessment & Plan Note (Signed)
Right middle lobe pulmonary nodule has resolved, consistent with mucoid impaction.  Good news.  Explained this to her today.  She can get back into the lung cancer screening program, next scan to be done in August 2025

## 2023-03-10 NOTE — Progress Notes (Signed)
Subjective:    Patient ID: [name], female    DOB: 07/12/1960, 62 y.o.   MRN: [id number]  HPI 62 year old woman with a history of active tobacco use and vape use (20 pack years).  She has diabetes, hypertension, COPD, atrial flutter, chronic systolic CHF.  She participates in lung cancer screening program with the most recent scan 11/23/2022 as below.  She has cut down her cigarettes, currently smoking 4 cigarettes daily.  She is motivated to quit.  She has albuterol that she uses as needed, uses it approximately 2x a day. Good functional capacity but she can get SOB with long walks. She sometimes has to stop. She coughs daily usually non-productive. She can occasionally hear wheeze, especially in the am. She was on spiriva at some point in the past.   Lung cancer screening CT chest 11/23/2022 reviewed by me shows no mediastinal or hilar adenopathy, mild centrilobular emphysema and diffuse bronchial wall thickening.  There is a new focal endobronchial density in the posterior medial right middle lobe 7 mm compared with 03/23/2021.  Also a new tiny 3 mm anterior right lower lobe nodule.  Pulmonary function testing 10/04/2013 reviewed by me, shows mixed obstruction and restriction with a positive bronchodilator response.  FEV1 2.34 L (69% predicted).  Lung volumes confirm mild restriction.  Diffusion capacity slightly decreased and corrects to normal range when adjusted for alveolar volume.   ROV 03/10/2023 --follow-up visit for 62 year old woman with history of active tobacco and vape use, mixed obstruction and restriction with a positive bronchodilator response on PFT (2015).  Also with a history of diabetes, hypertension, A flutter, chronic systolic CHF.  I saw her in July after her lung cancer screening CT 10/2022 showed a new focal endobronchial density in the posterior medial right middle lobe compared with 02/2021.  There was also a new tiny 3 mm anterior right lower lobe nodule seen.  Based  on this we planned to repeat her CT chest in August as below.  We restarted Spiriva at her last visit to see if she would tolerate.  She reports that she believes that it has helped her breathing overall. She has cut her cig down to 10/day. She doesn't cough often - spiriva has helped this. She is having some vertigo / dizziness.   Super D CT chest 02/23/2023 reviewed by me, shows that the right middle lobe area of endobronchial density has resolved and likely represented mucus impaction.  There is a left lower lobe calcified granuloma and an anterior right lower lobe 3 mm nodule that is unchanged.   Review of Systems As per HPI  Past Medical History:  Diagnosis Date   Anxiety    Arthritis    Chronic systolic heart failure (HCC)    EF 20-25% ECHO 05/2015   COPD (chronic obstructive pulmonary disease) (HCC)    Depression    Diabetes mellitus without complication (HCC)    Type II   Dysrhythmia    pt unsure of name of arrythmia - " my heart rate will drop all of a sudden" -no current treatment - atrial flutter   Fibrillation, atrial (HCC)    Gallstones    GERD (gastroesophageal reflux disease)    Headache(784.0)    migraines   Hypertension    Osteomyelitis (HCC)    R ankle   Rash    arms     Family History  Problem Relation Age of Onset   Diabetes Mother    Lung cancer Father  Heart attack Father 53       CABG x4   Breast cancer Sister        Survivor     Son >> sarcoidosis  Social History   Socioeconomic History   Marital status: Divorced    Spouse name: Not on file   Number of children: Not on file   Years of education: Not on file   Highest education level: Not on file  Occupational History   Not on file  Tobacco Use   Smoking status: Every Day    Current packs/day: 1.00    Average packs/day: 1 pack/day for 20.2 years (20.2 ttl pk-yrs)    Types: Cigarettes, E-cigarettes    Start date: 09/19/2016   Smokeless tobacco: Never   Tobacco comments:    Pt stated  smoking 12 cigarettes daily  updated 03/10/2023 amy marsh, cma  Vaping Use   Vaping status: Every Day   Start date: 10/12/2016  Substance and Sexual Activity   Alcohol use: No    Alcohol/week: 0.0 standard drinks of alcohol   Drug use: No   Sexual activity: Not Currently    Birth control/protection: Post-menopausal  Other Topics Concern   Not on file  Social History Narrative   ** Merged History Encounter **       Social Determinants of Health   Financial Resource Strain: Not on file  Food Insecurity: Not on file  Transportation Needs: Not on file  Physical Activity: Not on file  Stress: Not on file  Social Connections: Not on file  Intimate Partner Violence: Not on file     Allergies  Allergen Reactions   Biaxin [Clarithromycin] Anaphylaxis and Swelling   Clarithromycin Shortness Of Breath and Swelling   Lisinopril Swelling and Cough    Lip edema   Sulfa Antibiotics Anaphylaxis, Shortness Of Breath, Swelling and Hypertension   Chlorthalidone Nausea And Vomiting and Other (See Comments)    Clammy, Tachycardia, Headache    Daptomycin Nausea And Vomiting   Latex Hives and Rash   Amoxicillin-Pot Clavulanate Diarrhea    Has patient had a PCN reaction causing immediate rash, facial/tongue/throat swelling, SOB or lightheadedness with hypotension:No Has patient had a PCN reaction causing severe rash involving mucus membranes or skin necrosis:No Has patient had a PCN reaction that required hospitalization:No Has patient had a PCN reaction occurring within THE LAST 10 YEARS.  #  #  #  YES  #  #  #  If all of the above answers are "NO", then may proceed with Cephalosporin use.    Aspirin Nausea And Vomiting and Rash    On 325mg  dosage   Cholestyramine Nausea And Vomiting   Dilaudid [Hydromorphone Hcl] Nausea And Vomiting   Lasix [Furosemide] Nausea And Vomiting and Other (See Comments)    Headache      Outpatient Medications Prior to Visit  Medication Sig Dispense Refill    ACCU-CHEK GUIDE test strip USE AS DIRECTED IN THE MORNING, AT NOON, IN THE EVENING AND AT BEDTIME 400 strip 3   Accu-Chek Softclix Lancets lancets Use to check blood sugar THREE TIMES DAILY 100 each 2   albuterol (VENTOLIN HFA) 108 (90 Base) MCG/ACT inhaler Inhale 2 puffs into the lungs every 4 (four) hours as needed for wheezing or shortness of breath. 18 g 2   amitriptyline (ELAVIL) 50 MG tablet Take 1 tablet (50 mg total) by mouth at bedtime. 90 tablet 3   BD PEN NEEDLE NANO 2ND GEN 32G X 4 MM MISC  USE 1 PEN NEEDLE UNDER THE SKIN FOUR TIMES DAILY 400 each 2   Blood Glucose Monitoring Suppl (ACCU-CHEK GUIDE ME) w/Device KIT Use to check blood sugar TID. 1 kit 0   cetirizine (ZYRTEC) 10 MG tablet TAKE 1 TABLET(10 MG) BY MOUTH DAILY 90 tablet 2   Continuous Glucose Sensor (DEXCOM G6 SENSOR) MISC USE 1 DEVICE AS DIRECTED 9 each 4   Continuous Glucose Transmitter (DEXCOM G6 TRANSMITTER) MISC USE AS DIRECTED 1 each 3   dicyclomine (BENTYL) 10 MG capsule TAKE ONE CAPSULE BY MOUTH THREE TIMES DAILY BEFORE MEALS 270 capsule 0   diphenoxylate-atropine (LOMOTIL) 2.5-0.025 MG tablet Take 1 tablet by mouth 2 (two) times daily. 180 tablet 1   ergocalciferol (DRISDOL) 1.25 MG (50000 UT) capsule Take 1 capsule (50,000 Units total) by mouth once a week. (Patient taking differently: Take 1,000 Units by mouth daily.) 12 capsule 1   fenofibrate (TRICOR) 145 MG tablet TAKE ONE TABLET BY MOUTH EVERY DAY 90 tablet 0   FLUoxetine (PROZAC) 40 MG capsule TAKE ONE CAPSULE BY MOUTH AT BEDTIME 30 capsule 0   hydrOXYzine (ATARAX) 10 MG tablet TAKE ONE TABLET BY MOUTH THREE TIMES DAILY AS NEEDED 90 tablet 2   Insulin Disposable Pump (OMNIPOD 5 G6 PODS, GEN 5,) MISC USE 1 DEVICE EVERY 3 DAYS 30 each 3   Lancet Devices (ACCU-CHEK SOFTCLIX) lancets Use as instructed daily. 1 each 5   LANTUS SOLOSTAR 100 UNIT/ML Solostar Pen INJECT 24 UNITS UNDER THE SKIN DAILY 30 mL 2   meclizine (ANTIVERT) 25 MG tablet TAKE 1 TABLET(25 MG) BY  MOUTH TWICE DAILY AS NEEDED FOR DIZZINESS 14 tablet 0   metoprolol succinate (TOPROL-XL) 25 MG 24 hr tablet Take 25 mg by mouth daily.     mupirocin ointment (BACTROBAN) 2 % Apply 1 Application topically 2 (two) times daily. 22 g 1   NOVOLOG FLEXPEN 100 UNIT/ML FlexPen Max Daily 50 units per correction scale 45 mL 2   omeprazole (PRILOSEC) 40 MG capsule TAKE 1 CAPSULE(40 MG) BY MOUTH DAILY 30 capsule 0   pregabalin (LYRICA) 150 MG capsule TAKE 1 CAPSULE(150 MG) BY MOUTH TWICE DAILY 60 capsule 4   promethazine (PHENERGAN) 25 MG tablet 1/2-1 tab every 4 to 6 hours prn nausea 20 tablet 0   rosuvastatin (CRESTOR) 10 MG tablet Take 1 tablet (10 mg total) by mouth daily. 90 tablet 0   Tiotropium Bromide Monohydrate (SPIRIVA RESPIMAT) 1.25 MCG/ACT AERS Inhale 2 puffs into the lungs daily. 4 g 5   Continuous Glucose Receiver (DEXCOM G6 RECEIVER) DEVI Use as directed (Patient not taking: Reported on 03/10/2023) 1 each 0   metoprolol succinate (TOPROL-XL) 50 MG 24 hr tablet TAKE 1 TABLET(50 MG) BY MOUTH DAILY 90 tablet 0   Tiotropium Bromide Monohydrate (SPIRIVA RESPIMAT) 1.25 MCG/ACT AERS Inhale 2 puffs into the lungs daily. (Patient not taking: Reported on 03/10/2023) 4 g 0   Facility-Administered Medications Prior to Visit  Medication Dose Route Frequency Provider Last Rate Last Admin   regadenoson (LEXISCAN) injection SOLN 0.4 mg  0.4 mg Intravenous Once Lars Masson, MD       technetium tetrofosmin (TC-MYOVIEW) injection 32.8 millicurie  32.8 millicurie Intravenous Once PRN Lars Masson, MD            Objective:   Physical Exam  Vitals:   03/10/23 1329  BP: 126/68  Pulse: 92  Temp: 98.3 F (36.8 C)  TempSrc: Oral  SpO2: 96%  Weight: 158 lb 3.2 oz (71.8 kg)  Height: 5\' 7"  (1.702 m)   Gen: Pleasant, well-nourished, in no distress,  normal affect  ENT: No lesions,  mouth clear,  oropharynx clear, no postnasal drip  Neck: No JVD, no stridor  Lungs: No use of accessory  muscles, bilateral inspiratory rhonchi, she does have wheeze on forced expiration  Cardiovascular: RRR, heart sounds normal, no murmur or gallops, no peripheral edema  Musculoskeletal: No deformities, no cyanosis or clubbing  Neuro: alert, awake, non focal  Skin: Warm, scattered rash on back, face, arms with some evidence of excoriation      Assessment & Plan:  COPD (chronic obstructive pulmonary disease) (HCC) Suspect significant COPD based on the emphysema on her CT chest and her symptoms.  She had pulmonary function testing in 2015 that would support the diagnosis.  She has benefited from restarting Spiriva.  We will recheck her PFT to look for interval change in her obstruction.  Order albuterol for her to use as needed.  Discussed the flu shot, COVID-19 vaccine with her today.  Pulmonary nodule Right middle lobe pulmonary nodule has resolved, consistent with mucoid impaction.  Good news.  Explained this to her today.  She can get back into the lung cancer screening program, next scan to be done in August 2025    Levy Pupa, MD, PhD 03/10/2023, 1:53 PM [facility] [phone] or if no answer before 7:00PM call [phone] For any issues after 7:00PM please call eLink [phone]

## 2023-03-10 NOTE — Patient Instructions (Signed)
We reviewed your CT scan of the chest today. You we will need to have a repeat lung cancer screening CT chest in August 2025 Continue Spiriva 2 puffs once daily. We will give you prescription for albuterol.  You can use 2 puffs up to every 4 hours if needed for shortness of breath, chest tightness, wheezing. We will arrange for pulmonary function testing before your next visit Congratulations on decreasing your cigarettes.  Continue work hard on this.  Our ultimate goal will be to quit altogether Follow Dr. Delton Coombes in 3 months

## 2023-03-11 ENCOUNTER — Other Ambulatory Visit: Payer: Self-pay | Admitting: Neurology

## 2023-03-12 ENCOUNTER — Other Ambulatory Visit: Payer: Self-pay | Admitting: Family Medicine

## 2023-03-12 DIAGNOSIS — H8113 Benign paroxysmal vertigo, bilateral: Secondary | ICD-10-CM

## 2023-03-14 ENCOUNTER — Other Ambulatory Visit: Payer: Self-pay | Admitting: Family Medicine

## 2023-03-14 DIAGNOSIS — K580 Irritable bowel syndrome with diarrhea: Secondary | ICD-10-CM

## 2023-03-14 NOTE — Telephone Encounter (Signed)
Requested Prescriptions   Pending Prescriptions Disp Refills   pregabalin (LYRICA) 150 MG capsule [Pharmacy Med Name: pregabalin 150 mg capsule] 60 capsule 5    Sig: TAKE ONE CAPSULE BY MOUTH TWICE DAILY   LAST SEEN 08/23/22, NEXT APPOINTMENT10/24/24  Dispenses   ROUTING TO WORK IN PROVIDER Dispensed Days Supply Quantity Provider Pharmacy  PREGABALIN 150MG  CAPSULES 02/14/2023 30 60 each Windell Norfolk, MD [facility]...  pregabalin 150 mg capsule 01/14/2023 30 60 each Windell Norfolk, MD [facility] [place]  pregabalin 150 mg capsule 12/16/2022 30 60 each Windell Norfolk, MD [facility] [place]  pregabalin 150 mg capsule 11/04/2022 30 60 each Windell Norfolk, MD [facility] [place]  pregabalin 150 mg capsule 10/07/2022 30 60 each Windell Norfolk, MD [facility] [place]  PREGABALIN 150MG  CAPSULES 09/09/2022 30 60 each Windell Norfolk, MD [facility]...  pregabalin 150 mg capsule 07/08/2022 30 60 each Windell Norfolk, MD [facility] [place]  pregabalin 150 mg capsule 06/08/2022 30 60 each Windell Norfolk, MD [facility] [place]  pregabalin 150 mg capsule 05/10/2022 30 60 each Windell Norfolk, MD [facility] [place]  PREGABALIN 75MG  CAPSULES 04/20/2022 90 270 each Windell Norfolk, MD [facility] #...  pregabalin 150 mg capsule 04/12/2022 30 60 each Windell Norfolk, MD [facility] [place]Marland KitchenMarland Kitchen

## 2023-03-14 NOTE — Telephone Encounter (Signed)
Requested medications are due for refill today.  yes  Requested medications are on the active medications list.  yes  Last refill. 03/02/2023 #14 0 rf  Future visit scheduled.   yes  Notes to clinic.  Refill not delegated.    Requested Prescriptions  Pending Prescriptions Disp Refills   meclizine (ANTIVERT) 25 MG tablet [Pharmacy Med Name: MECLIZINE 25MG  RX TABLETS] 14 tablet 0    Sig: TAKE 1 TABLET(25 MG) BY MOUTH TWICE DAILY AS NEEDED FOR DIZZINESS     Not Delegated - Gastroenterology: Antiemetics Failed - 03/12/2023  3:20 PM      Failed - This refill cannot be delegated      Failed - Valid encounter within last 6 months    Recent Outpatient Visits           9 months ago Smoking greater than 20 pack years   [facility] [place], Odette Horns, MD   1 year ago Dizziness   [place] [facility] [place], Odette Horns, MD   1 year ago Type 2 diabetes mellitus with other neurologic complication, with long-term current use of insulin [facility])   [facility] [place], [place], [place]   1 year ago Type 2 diabetes mellitus with other neurologic complication, with long-term current use of insulin (HCC)   [facility] [place], Odette Horns, MD   1 year ago Type 2 diabetes mellitus with other neurologic complication, with long-term current use of insulin [facility])   [place] [facility] Hoy Register, MD       Future Appointments             In 2 days Hoy Register, MD [facility]   In 2 months Byrum, Les Pou, MD [facility]

## 2023-03-16 ENCOUNTER — Encounter: Payer: Self-pay | Admitting: Family Medicine

## 2023-03-16 ENCOUNTER — Ambulatory Visit: Payer: Medicaid Other | Attending: Family Medicine | Admitting: Family Medicine

## 2023-03-16 VITALS — BP 113/71 | HR 82 | Ht 67.0 in | Wt 155.8 lb

## 2023-03-16 DIAGNOSIS — L0292 Furuncle, unspecified: Secondary | ICD-10-CM

## 2023-03-16 DIAGNOSIS — M542 Cervicalgia: Secondary | ICD-10-CM | POA: Diagnosis not present

## 2023-03-16 DIAGNOSIS — M7502 Adhesive capsulitis of left shoulder: Secondary | ICD-10-CM

## 2023-03-16 DIAGNOSIS — Z23 Encounter for immunization: Secondary | ICD-10-CM

## 2023-03-16 DIAGNOSIS — K580 Irritable bowel syndrome with diarrhea: Secondary | ICD-10-CM | POA: Diagnosis not present

## 2023-03-16 DIAGNOSIS — E1159 Type 2 diabetes mellitus with other circulatory complications: Secondary | ICD-10-CM

## 2023-03-16 DIAGNOSIS — H8113 Benign paroxysmal vertigo, bilateral: Secondary | ICD-10-CM

## 2023-03-16 DIAGNOSIS — I5022 Chronic systolic (congestive) heart failure: Secondary | ICD-10-CM

## 2023-03-16 DIAGNOSIS — F419 Anxiety disorder, unspecified: Secondary | ICD-10-CM

## 2023-03-16 DIAGNOSIS — E113559 Type 2 diabetes mellitus with stable proliferative diabetic retinopathy, unspecified eye: Secondary | ICD-10-CM

## 2023-03-16 DIAGNOSIS — Z794 Long term (current) use of insulin: Secondary | ICD-10-CM

## 2023-03-16 DIAGNOSIS — E1149 Type 2 diabetes mellitus with other diabetic neurological complication: Secondary | ICD-10-CM

## 2023-03-16 DIAGNOSIS — E119 Type 2 diabetes mellitus without complications: Secondary | ICD-10-CM

## 2023-03-16 DIAGNOSIS — I152 Hypertension secondary to endocrine disorders: Secondary | ICD-10-CM

## 2023-03-16 MED ORDER — FLUOXETINE HCL 40 MG PO CAPS: 40.0000 mg | ORAL_CAPSULE | Freq: Every day | ORAL | 1 refills | Status: DC

## 2023-03-16 MED ORDER — DICYCLOMINE HCL 10 MG PO CAPS: ORAL_CAPSULE | ORAL | 0 refills | Status: DC

## 2023-03-16 MED ORDER — MECLIZINE HCL 25 MG PO TABS
25.0000 mg | ORAL_TABLET | Freq: Three times a day (TID) | ORAL | 1 refills | Status: DC | PRN
Start: 2023-03-16 — End: 2023-08-10

## 2023-03-16 MED ORDER — OMEPRAZOLE 40 MG PO CPDR: 40.0000 mg | DELAYED_RELEASE_CAPSULE | Freq: Every day | ORAL | 1 refills | Status: DC

## 2023-03-16 MED ORDER — HYDROXYZINE HCL 10 MG PO TABS: 10.0000 mg | ORAL_TABLET | Freq: Three times a day (TID) | ORAL | 1 refills | Status: DC | PRN

## 2023-03-16 NOTE — Progress Notes (Signed)
Subjective:  Patient ID: [name], female    DOB: Jan 18, 1961  Age: 62 y.o. MRN: [id number]  CC: Medical Management of Chronic Issues (Neck pain/Referral for dermatology and Laurette Schimke)   HPI [name] is a 62 y.o. year old female with a history of type 2 diabetes mellitus (A1c 8.3), hypertension, CHF (EF 60-65%  2-D echo of 08/2021), anxiety, GERD, COVID-19 in 01/2020, greater than 20-pack-year history.   Interval History: Discussed the use of AI scribe software for clinical note transcription with the patient, who gave verbal consent to proceed.   The patient presents with multiple complaints. She has been experiencing worsening abdominal pain, similar to menstrual cramps, associated with diarrhea. She has not identified any triggers or alleviating factors for these symptoms.  Given Bentyl and Lomotil for her diarrhea.  The patient also reports a skin rash, which is localized to the upper body, from the bra strap upwards. She has previously seen a dermatologist, who did not do anything different to him but advised her to return when a new bump appears.  Additionally, the patient has been experiencing neck pain for about a week. The pain is triggered by certain movements of the head and is associated with a popping sound. She has tried changing pillows without any relief. The pain also occurs when she chews and turns her head. She reports tingling and numbness down her arm.  Lastly, the patient reports stiffness in her left shoulder, which limits her range of motion. She is unable to lift her left arm above ninety degrees or move it backwards without experiencing significant pain.  The patient also mentions having short-term memory loss, and would like to start Prevagen for this.  She is currently on multiple medications, including hydroxyzine and Prozac for anxiety and depression, and Lyrica for neuropathic pain. She also has a history of diabetes, for which she is seeing an  endocrinologist. She has been diagnosed with emphysema and COPD. She is currently using Spiriva for this.  Her pulmonary nodule is also being monitored by pulmonary-Dr. Delton Coombes       Past Medical History:  Diagnosis Date   Anxiety    Arthritis    Chronic systolic heart failure (HCC)    EF 20-25% ECHO 05/2015   COPD (chronic obstructive pulmonary disease) (HCC)    Depression    Diabetes mellitus without complication (HCC)    Type II   Dysrhythmia    pt unsure of name of arrythmia - " my heart rate will drop all of a sudden" -no current treatment - atrial flutter   Fibrillation, atrial (HCC)    Gallstones    GERD (gastroesophageal reflux disease)    Headache(784.0)    migraines   Hypertension    Osteomyelitis (HCC)    R ankle   Rash    arms    Past Surgical History:  Procedure Laterality Date   ANKLE FRACTURE SURGERY Right 2015   CARDIAC CATHETERIZATION N/A 06/24/2015   Procedure: Left Heart Cath and Coronary Angiography;  Surgeon: [name], MD;  Location: [facility] INVASIVE CV LAB;  Service: Cardiovascular;  Laterality: N/A;   CARPAL TUNNEL RELEASE     bil   CHOLECYSTECTOMY  11/24/2011   Procedure: LAPAROSCOPIC CHOLECYSTECTOMY WITH INTRAOPERATIVE CHOLANGIOGRAM;  Surgeon: Clovis Pu. Cornett, MD;  Location: [facility] ORS;  Service: General;  Laterality: N/A;  Laparoscopic Cholecystectomy with Cholangiogram   COLONOSCOPY     ECTOPIC PREGNANCY SURGERY  many yrs ago   HARDWARE REMOVAL Right 07/15/2015  Procedure: RIGHT ANKLE HARDWARE REMOVAL, PLACEMENT OF STIMULAN ANTIBIOTIC BEADS AND PREVENA WOUND VAC.;  Surgeon: [name], MD;  Location: [facility] OR;  Service: Orthopedics;  Laterality: Right;   TOOTH EXTRACTION N/A 12/03/2016   Procedure: DENTAL EXTRACTIONS with Alveoloplasty;  Surgeon: Ocie Doyne, DDS;  Location[facility] OR;  Service: Oral Surgery;  Laterality: N/A;    Family History  Problem Relation Age of Onset   Diabetes Mother    Lung cancer Father    Heart attack Father 80        CABG x4   Breast cancer Sister        Survivor     Social History   Socioeconomic History   Marital status: Divorced    Spouse name: Not on file   Number of children: Not on file   Years of education: Not on file   Highest education level: Not on file  Occupational History   Not on file  Tobacco Use   Smoking status: Every Day    Current packs/day: 1.00    Average packs/day: 1 pack/day for 20.2 years (20.2 ttl pk-yrs)    Types: Cigarettes, E-cigarettes    Start date: 09/19/2016   Smokeless tobacco: Never   Tobacco comments:    Pt stated smoking 12 cigarettes daily  updated 03/10/2023 amy marsh, cma  Vaping Use   Vaping status: Every Day   Start date: 10/12/2016  Substance and Sexual Activity   Alcohol use: No    Alcohol/week: 0.0 standard drinks of alcohol   Drug use: No   Sexual activity: Not Currently    Birth control/protection: Post-menopausal  Other Topics Concern   Not on file  Social History Narrative   ** Merged History Encounter **       Social Determinants of Health   Financial Resource Strain: Not on file  Food Insecurity: Not on file  Transportation Needs: Not on file  Physical Activity: Not on file  Stress: Not on file  Social Connections: Not on file    Allergies  Allergen Reactions   Biaxin [Clarithromycin] Anaphylaxis and Swelling   Clarithromycin Shortness Of Breath and Swelling   Lisinopril Swelling and Cough    Lip edema   Sulfa Antibiotics Anaphylaxis, Shortness Of Breath, Swelling and Hypertension   Chlorthalidone Nausea And Vomiting and Other (See Comments)    Clammy, Tachycardia, Headache    Daptomycin Nausea And Vomiting   Latex Hives and Rash   Amoxicillin-Pot Clavulanate Diarrhea    Has patient had a PCN reaction causing immediate rash, facial/tongue/throat swelling, SOB or lightheadedness with hypotension:No Has patient had a PCN reaction causing severe rash involving mucus membranes or skin necrosis:No Has patient had a PCN  reaction that required hospitalization:No Has patient had a PCN reaction occurring within THE LAST 10 YEARS.  #  #  #  YES  #  #  #  If all of the above answers are "NO", then may proceed with Cephalosporin use.    Aspirin Nausea And Vomiting and Rash    On 325mg  dosage   Cholestyramine Nausea And Vomiting   Dilaudid [Hydromorphone Hcl] Nausea And Vomiting   Lasix [Furosemide] Nausea And Vomiting and Other (See Comments)    Headache     Outpatient Medications Prior to Visit  Medication Sig Dispense Refill   ACCU-CHEK GUIDE test strip USE AS DIRECTED IN THE MORNING, AT NOON, IN THE EVENING AND AT BEDTIME 400 strip 3   Accu-Chek Softclix Lancets lancets Use to check blood sugar  THREE TIMES DAILY 100 each 2   albuterol (VENTOLIN HFA) 108 (90 Base) MCG/ACT inhaler Inhale 2 puffs into the lungs every 4 (four) hours as needed for wheezing or shortness of breath. 18 g 2   amitriptyline (ELAVIL) 50 MG tablet Take 1 tablet (50 mg total) by mouth at bedtime. 90 tablet 3   BD PEN NEEDLE NANO 2ND GEN 32G X 4 MM MISC USE 1 PEN NEEDLE UNDER THE SKIN FOUR TIMES DAILY 400 each 2   Blood Glucose Monitoring Suppl (ACCU-CHEK GUIDE ME) w/Device KIT Use to check blood sugar TID. 1 kit 0   cetirizine (ZYRTEC) 10 MG tablet TAKE 1 TABLET(10 MG) BY MOUTH DAILY 90 tablet 2   Continuous Glucose Sensor (DEXCOM G6 SENSOR) MISC USE 1 DEVICE AS DIRECTED 9 each 4   Continuous Glucose Transmitter (DEXCOM G6 TRANSMITTER) MISC USE AS DIRECTED 1 each 3   diphenoxylate-atropine (LOMOTIL) 2.5-0.025 MG tablet Take 1 tablet by mouth 2 (two) times daily. 180 tablet 1   fenofibrate (TRICOR) 145 MG tablet TAKE ONE TABLET BY MOUTH EVERY DAY 90 tablet 0   Insulin Disposable Pump (OMNIPOD 5 G6 PODS, GEN 5,) MISC USE 1 DEVICE EVERY 3 DAYS 30 each 3   Lancet Devices (ACCU-CHEK SOFTCLIX) lancets Use as instructed daily. 1 each 5   LANTUS SOLOSTAR 100 UNIT/ML Solostar Pen INJECT 24 UNITS UNDER THE SKIN DAILY 30 mL 2   metoprolol  succinate (TOPROL-XL) 25 MG 24 hr tablet Take 25 mg by mouth daily.     mupirocin ointment (BACTROBAN) 2 % Apply 1 Application topically 2 (two) times daily. 22 g 1   NOVOLOG FLEXPEN 100 UNIT/ML FlexPen Max Daily 50 units per correction scale 45 mL 2   pregabalin (LYRICA) 150 MG capsule TAKE ONE CAPSULE BY MOUTH TWICE DAILY 60 capsule 5   promethazine (PHENERGAN) 25 MG tablet 1/2-1 tab every 4 to 6 hours prn nausea 20 tablet 0   rosuvastatin (CRESTOR) 10 MG tablet Take 1 tablet (10 mg total) by mouth daily. 90 tablet 0   Tiotropium Bromide Monohydrate (SPIRIVA RESPIMAT) 1.25 MCG/ACT AERS Inhale 2 puffs into the lungs daily. 4 g 5   dicyclomine (BENTYL) 10 MG capsule TAKE ONE CAPSULE BY MOUTH THREE TIMES DAILY BEFORE MEALS 270 capsule 0   FLUoxetine (PROZAC) 40 MG capsule TAKE ONE CAPSULE BY MOUTH AT BEDTIME 30 capsule 0   hydrOXYzine (ATARAX) 10 MG tablet TAKE ONE TABLET BY MOUTH THREE TIMES DAILY AS NEEDED 90 tablet 2   meclizine (ANTIVERT) 25 MG tablet TAKE 1 TABLET(25 MG) BY MOUTH TWICE DAILY AS NEEDED FOR DIZZINESS 14 tablet 0   omeprazole (PRILOSEC) 40 MG capsule TAKE 1 CAPSULE(40 MG) BY MOUTH DAILY 30 capsule 0   ergocalciferol (DRISDOL) 1.25 MG (50000 UT) capsule Take 1 capsule (50,000 Units total) by mouth once a week. (Patient not taking: Reported on 03/16/2023) 12 capsule 1   Facility-Administered Medications Prior to Visit  Medication Dose Route Frequency Provider Last Rate Last Admin   regadenoson (LEXISCAN) injection SOLN 0.4 mg  0.4 mg Intravenous Once Lars Masson, MD       technetium tetrofosmin (TC-MYOVIEW) injection 32.8 millicurie  32.8 millicurie Intravenous Once PRN Lars Masson, MD         ROS Review of Systems  Constitutional:  Negative for activity change and appetite change.  HENT:  Negative for sinus pressure and sore throat.   Respiratory:  Negative for chest tightness, shortness of breath and wheezing.   Cardiovascular:  Negative for chest pain and  palpitations.  Gastrointestinal:  Negative for abdominal distention, abdominal pain and constipation.  Genitourinary: Negative.   Musculoskeletal:        See HPI  Psychiatric/Behavioral:  Negative for behavioral problems and dysphoric mood.     Objective:  BP 113/71   Pulse 82   Ht 5\' 7"  (1.702 m)   Wt 155 lb 12.8 oz (70.7 kg)   LMP 06/09/2011 Comment: verified BEFORE imaging  SpO2 95%   BMI 24.40 kg/m      03/16/2023    2:02 PM 03/10/2023    1:29 PM 01/06/2023   10:05 AM  BP/Weight  Systolic BP 113 126 134  Diastolic BP 71 68 80  Wt. (Lbs) 155.8 158.2 153.8  BMI 24.4 kg/m2 24.78 kg/m2 24.09 kg/m2      Physical Exam Constitutional:      Appearance: She is well-developed.  Cardiovascular:     Rate and Rhythm: Normal rate.     Heart sounds: Normal heart sounds. No murmur heard. Pulmonary:     Effort: Pulmonary effort is normal.     Breath sounds: Normal breath sounds. No wheezing or rales.  Chest:     Chest wall: No tenderness.  Abdominal:     General: Bowel sounds are normal. There is no distension.     Palpations: Abdomen is soft. There is no mass.     Tenderness: There is no abdominal tenderness.  Musculoskeletal:     Right shoulder: No tenderness. Normal range of motion.     Left shoulder: Tenderness present. Decreased range of motion.     Right hand: Normal strength.     Left hand: Normal strength.     Cervical back: Tenderness (on palpation of left side of neck) present.     Right lower leg: No edema.     Left lower leg: No edema.  Skin:    Comments: Multiple furuncles in different stages of healing  Neurological:     Mental Status: She is alert and oriented to person, place, and time.  Psychiatric:        Mood and Affect: Mood normal.        Latest Ref Rng & Units 06/03/2022    8:40 AM 04/26/2022   10:38 AM 11/25/2021    4:45 PM  CMP  Glucose 70 - 99 mg/dL 811   914   BUN 8 - 27 mg/dL 8   9   Creatinine 7.82 - 1.00 mg/dL 9.56   2.13   Sodium  086 - 144 mmol/L 139   140   Potassium 3.5 - 5.2 mmol/L 4.2   4.0   Chloride 96 - 106 mmol/L 102   102   CO2 20 - 29 mmol/L 23   21   Calcium 8.7 - 10.3 mg/dL 9.3   9.5   Total Protein 6.0 - 8.5 g/dL 7.4  6.8  7.2   Total Bilirubin 0.0 - 1.2 mg/dL 0.3  0.4  <5.7   Alkaline Phos 44 - 121 IU/L 88   71   AST 0 - 40 IU/L 10  12  11    ALT 0 - 32 IU/L 8  9  6      Lipid Panel     Component Value Date/Time   CHOL 142 06/03/2022 0840   TRIG 87 06/03/2022 0840   HDL 43 06/03/2022 0840   CHOLHDL 5.6 (H) 07/31/2019 1004   CHOLHDL 5.1 (H) 07/05/2016 0947   VLDL UNABLE TO CALCULATE IF  TRIGLYCERIDE OVER 400 mg/dL 91/47/8295 6213   LDLCALC 82 06/03/2022 0840    CBC    Component Value Date/Time   WBC 9.4 04/26/2022 1038   RBC 4.89 04/26/2022 1038   HGB 14.1 04/26/2022 1038   HGB 14.9 11/25/2021 1645   HCT 42.7 04/26/2022 1038   HCT 45.1 11/25/2021 1645   PLT 164 04/26/2022 1038   PLT 190 11/25/2021 1645   MCV 87.3 04/26/2022 1038   MCV 88 11/25/2021 1645   MCH 28.8 04/26/2022 1038   MCHC 33.0 04/26/2022 1038   RDW 12.4 04/26/2022 1038   RDW 12.5 11/25/2021 1645   LYMPHSABS 2,341 04/26/2022 1038   LYMPHSABS 3.0 11/25/2021 1645   MONOABS 0.6 11/05/2021 1547   EOSABS 235 04/26/2022 1038   EOSABS 0.3 11/25/2021 1645   BASOSABS 47 04/26/2022 1038   BASOSABS 0.1 11/25/2021 1645    Lab Results  Component Value Date   HGBA1C 8.4 (H) 06/03/2022    Assessment & Plan:   IBS with diarrhea Severe abdominal pain and diarrhea. -Uncontrolled on Bentyl and Imodium -Patient expressed concern about potential Crohn's disease. -Refer to gastroenterology for further evaluation and management.  Skin rash/foreign Kalosis Patient reports skin breakouts and has previously seen a dermatologist. -Refer to dermatology for further evaluation. Advised patient to take pictures of skin lesions for documentation.  Neck Pain/cervical radiculopathy Patient reports neck pain and popping sensation for  about a week. No relief with change of pillows. Possible cervical radiculopathy. -Order neck x-ray to evaluate for possible arthritis or nerve compression. -Refer to physical therapy for neck pain management.  Left shoulder adhesive capsulitis Limited range of motion in left shoulder. Patient cannot lift left arm above 90 degrees. -Refer to physical therapy for shoulder stiffness management.  Type 2 diabetes mellitus -A1c 8.3 Patient is currently under the care of an endocrinologist. -Continue current management with endocrinologist.  Neuropathy Patient reports worsening neuropathic pain despite Lyrica therapy. -Plan to discuss with neurologist at upcoming appointment.  COPD/Emphysema Patient reports upcoming pulmonary function test to assess disease progression. -Continue current management with Spiriva.  CHF EF 66 5% Euvolemic Patient is under the care of a cardiologist. -Continue current management with cardiologist.  Diabetic retinopathy Currently under the care of ophthalmology -Corneal replacement and cataract removal in November.  General Health Maintenance -Administer influenza vaccine today. -Recommend shingles vaccine at next visit. -Order comprehensive blood work for tomorrow, 03/17/2023, while fasting. -Refill all necessary medications and send to [facility] at [facility].          Meds ordered this encounter  Medications   dicyclomine (BENTYL) 10 MG capsule    Sig: TAKE ONE CAPSULE BY MOUTH THREE TIMES DAILY BEFORE MEALS    Dispense:  270 capsule    Refill:  0   FLUoxetine (PROZAC) 40 MG capsule    Sig: Take 1 capsule (40 mg total) by mouth at bedtime.    Dispense:  90 capsule    Refill:  1   hydrOXYzine (ATARAX) 10 MG tablet    Sig: Take 1 tablet (10 mg total) by mouth 3 (three) times daily as needed.    Dispense:  270 tablet    Refill:  1    This prescription was filled on 01/14/2023. Any refills authorized will be placed on file.   meclizine  (ANTIVERT) 25 MG tablet    Sig: Take 1 tablet (25 mg total) by mouth 3 (three) times daily as needed for dizziness.    Dispense:  90 tablet  Refill:  1   omeprazole (PRILOSEC) 40 MG capsule    Sig: Take 1 capsule (40 mg total) by mouth daily.    Dispense:  90 capsule    Refill:  1    Follow-up: Return in about 6 months (around 09/13/2023) for Chronic medical conditions.     Visit required 49 minutes of patient care including median intraservice time, reviewing previous notes and test results, coordination of care, counseling the patient in addition to management of chronic medical conditions.Time also spent ordering medications, investigations and documenting in the chart.  All questions were answered to the patient's satisfaction   Hoy Register, MD, FAAFP. [facility] [place], [place] [phone]   03/16/2023, 6:11 PM

## 2023-03-16 NOTE — Patient Instructions (Signed)
Cervical Radiculopathy  Cervical radiculopathy happens when a nerve in the neck (a cervical nerve) is pinched or bruised. This condition can happen because of an injury to the cervical spine (vertebrae) in the neck, or as part of the normal aging process. Pressure on the cervical nerves can cause pain or numbness that travels from the neck all the way down to the arm and fingers. This condition usually gets better with rest. Treatment may be needed if the condition does not improve. What are the causes? This condition may be caused by: A neck injury. A bulging (herniated) disk. Muscle spasms. Muscle tightness in the neck due to overuse. Arthritis. Breakdown or degeneration in the bones and joints of the spine (spondylosis) due to aging. Bone spurs that may develop near the cervical nerves. What are the signs or symptoms? Symptoms of this condition include: Pain. The pain may travel from the neck to the arm and hand. The pain can be severe or irritating. It may get worse when you move your neck. Numbness or tingling in your arm or hand. Weakness in the affected arm and hand, in severe cases. How is this diagnosed? This condition may be diagnosed based on your symptoms, your medical history, and a physical exam. You may also have tests, including: X-rays. CT scan. MRI. Electromyogram (EMG). Nerve conduction tests. How is this treated? In many cases, treatment is not needed for this condition. With rest, the condition usually gets better over time. If treatment is needed, options may include: Wearing a soft neck collar (cervical collar) for short periods of time. Doing physical therapy to strengthen your neck muscles. Taking medicines. These may include NSAIDs, such as ibuprofen, or oral corticosteroids. Having spinal injections, in severe cases. Having surgery. This may be needed if other treatments do not help. Different types of surgery may be done depending on the cause of this  condition. Follow these instructions at home: If you have a cervical collar: Wear it as told by your health care provider. Remove it only as told by your health care provider. Ask your health care provider if you can remove the cervical collar for cleaning and bathing. If you are allowed to remove the collar for cleaning or bathing: Follow instructions from your health care provider about how to remove the collar safely. Clean the collar by wiping it with mild soap and water and drying it completely. Take out any removable pads in the collar every 1-2 days, and wash them by hand with soap and water. Let them air-dry completely before you put them back in the collar. Check your skin under the collar for irritation or sores. If you see any, tell your health care provider. Managing pain     Take over-the-counter and prescription medicines only as told by your health care provider. If directed, put ice on the affected area. To do this: If you have a soft neck collar, remove it as told by your health care provider. Put ice in a plastic bag. Place a towel between your skin and the bag. Leave the ice on for 20 minutes, 2-3 times a day. Remove the ice if your skin turns bright red. This is very important. If you cannot feel pain, heat, or cold, you have a greater risk of damage to the area. If applying ice does not help, you can try using heat. Use the heat source that your health care provider recommends, such as a moist heat pack or a heating pad. Place a towel between  your skin and the heat source. Leave the heat on for 20-30 minutes. Remove the heat if your skin turns bright red. This is especially important if you are unable to feel pain, heat, or cold. You have a greater risk of getting burned. Try a gentle neck and shoulder massage to help relieve symptoms. Activity Rest as needed. Return to your normal activities as told by your health care provider. Ask your health care provider what  activities are safe for you. Do stretching and strengthening exercises as told by your health care provider or your physical therapist. You may have to avoid lifting. Ask your health care provider how much you can safely lift. General instructions Use a flat pillow when you sleep. Do not drive while wearing a cervical collar. If you do not have a cervical collar, ask your health care provider if it is safe to drive while your neck heals. Ask your health care provider if the medicine prescribed to you requires you to avoid driving or using machinery. Do not use any products that contain nicotine or tobacco. These products include cigarettes, chewing tobacco, and vaping devices, such as e-cigarettes. If you need help quitting, ask your health care provider. Keep all follow-up visits. This is important. Contact a health care provider if: Your condition does not improve with treatment. Get help right away if: Your pain gets much worse and is not controlled with medicines. You have weakness or numbness in your hand, arm, face, or leg. You have a high fever. You have a stiff, rigid neck. You lose control of your bowels or your bladder (have incontinence). You have trouble with walking, balance, or speaking. Summary Cervical radiculopathy happens when a nerve in the neck is pinched or bruised. A nerve can get pinched from a bulging disk, arthritis, muscle spasms, or an injury to the neck. Symptoms include pain, tingling, or numbness radiating from the neck to the arm or hand. Weakness can also occur in severe cases. Treatment may include rest, wearing a cervical collar, and physical therapy. Medicines may be prescribed to help with pain. In severe cases, injections or surgery may be needed. This information is not intended to replace advice given to you by your health care provider. Make sure you discuss any questions you have with your health care provider. Document Revised: 12/18/2020 Document  Reviewed: 12/18/2020 Elsevier Patient Education  2024 [employer].

## 2023-03-16 NOTE — Telephone Encounter (Signed)
Requested medication (s) are due for refill today: no  Requested medication (s) are on the active medication list: yes  Last refill:  03/04/23 #180 1 RF  Future visit scheduled: yes today   Notes to clinic:  med not delegated to NT to RF   Requested Prescriptions  Pending Prescriptions Disp Refills   diphenoxylate-atropine (LOMOTIL) 2.5-0.025 MG tablet [Pharmacy Med Name: diphenoxylate-atropine 2.5 mg-0.025 mg tablet] 60 tablet 3    Sig: TAKE ONE TABLET BY MOUTH TWICE DAILY     Not Delegated - Gastroenterology:  Antidiarrheals Failed - 03/14/2023  5:32 PM      Failed - This refill cannot be delegated      Passed - Valid encounter within last 12 months    Recent Outpatient Visits           9 months ago Smoking greater than 20 pack years   [facility] [place], Odette Horns, MD   1 year ago Dizziness   [facility] [place], Odette Horns, MD   1 year ago Type 2 diabetes mellitus with other neurologic complication, with long-term current use of insulin [facility])   [place] [facility] [place], [place], [place]   1 year ago Type 2 diabetes mellitus with other neurologic complication, with long-term current use of insulin (HCC)   [place] [facility] [place], Odette Horns, MD   1 year ago Type 2 diabetes mellitus with other neurologic complication, with long-term current use of insulin [facility])   [place] [facility] [place], Odette Horns, MD       Future Appointments             Today Hoy Register, MD [facility]   In 2 months Byrum, Les Pou, MD [facility] [place] [facility]

## 2023-03-17 ENCOUNTER — Other Ambulatory Visit: Payer: Medicaid Other

## 2023-03-17 ENCOUNTER — Other Ambulatory Visit: Payer: Self-pay | Admitting: Family Medicine

## 2023-03-17 ENCOUNTER — Encounter: Payer: Self-pay | Admitting: Nurse Practitioner

## 2023-03-17 ENCOUNTER — Encounter: Payer: Self-pay | Admitting: Family Medicine

## 2023-03-17 MED ORDER — FLUCONAZOLE 150 MG PO TABS: 150.0000 mg | ORAL_TABLET | Freq: Once | ORAL | 0 refills | Status: AC

## 2023-03-17 MED ORDER — DOXYCYCLINE HYCLATE 100 MG PO TABS
100.0000 mg | ORAL_TABLET | Freq: Two times a day (BID) | ORAL | 0 refills | Status: DC
Start: 2023-03-17 — End: 2023-04-05

## 2023-03-18 ENCOUNTER — Other Ambulatory Visit: Payer: Self-pay | Admitting: Family Medicine

## 2023-03-18 DIAGNOSIS — F419 Anxiety disorder, unspecified: Secondary | ICD-10-CM

## 2023-03-18 DIAGNOSIS — E1149 Type 2 diabetes mellitus with other diabetic neurological complication: Secondary | ICD-10-CM

## 2023-03-21 ENCOUNTER — Ambulatory Visit: Payer: Medicaid Other | Attending: Family Medicine

## 2023-03-21 DIAGNOSIS — E1149 Type 2 diabetes mellitus with other diabetic neurological complication: Secondary | ICD-10-CM

## 2023-03-22 LAB — LP+NON-HDL CHOLESTEROL
Cholesterol, Total: 135 mg/dL (ref 100–199)
HDL: 34 mg/dL — ABNORMAL LOW (ref >39)
LDL Chol Calc (NIH): 79 mg/dL (ref 0–99)
Total Non-HDL-Chol (LDL+VLDL): 101 mg/dL (ref 0–129)
Triglycerides: 122 mg/dL (ref 0–149)
VLDL Cholesterol Cal: 22 mg/dL (ref 5–40)

## 2023-03-22 LAB — CMP14+EGFR
ALT: 12 IU/L (ref 0–32)
AST: 21 IU/L (ref 0–40)
Albumin: 4 g/dL (ref 3.9–4.9)
Alkaline Phosphatase: 96 IU/L (ref 44–121)
BUN/Creatinine Ratio: 16 (ref 12–28)
BUN: 10 mg/dL (ref 8–27)
Bilirubin Total: 0.3 mg/dL (ref 0.0–1.2)
CO2: 23 mmol/L (ref 20–29)
Calcium: 9 mg/dL (ref 8.7–10.3)
Chloride: 100 mmol/L (ref 96–106)
Creatinine, Ser: 0.64 mg/dL (ref 0.57–1.00)
Globulin, Total: 2.8 g/dL (ref 1.5–4.5)
Glucose: 235 mg/dL — ABNORMAL HIGH (ref 70–99)
Potassium: 4.2 mmol/L (ref 3.5–5.2)
Sodium: 136 mmol/L (ref 134–144)
Total Protein: 6.8 g/dL (ref 6.0–8.5)
eGFR: 100 mL/min/{1.73_m2} (ref >59)

## 2023-03-22 LAB — MICROALBUMIN / CREATININE URINE RATIO
Creatinine, Urine: 130.6 mg/dL
Microalb/Creat Ratio: 15 mg/g creat (ref 0–29)
Microalbumin, Urine: 19.8 ug/mL

## 2023-04-04 ENCOUNTER — Encounter (HOSPITAL_COMMUNITY): Payer: Self-pay

## 2023-04-04 ENCOUNTER — Emergency Department (HOSPITAL_COMMUNITY)
Admission: EM | Admit: 2023-04-04 | Discharge: 2023-04-05 | Disposition: A | Discharge status: Home / Self Care | Payer: Medicaid Other | Attending: Emergency Medicine | Admitting: Emergency Medicine

## 2023-04-04 ENCOUNTER — Other Ambulatory Visit: Payer: Self-pay

## 2023-04-04 DIAGNOSIS — Z20822 Contact with and (suspected) exposure to covid-19: Secondary | ICD-10-CM | POA: Diagnosis not present

## 2023-04-04 DIAGNOSIS — R051 Acute cough: Secondary | ICD-10-CM | POA: Insufficient documentation

## 2023-04-04 DIAGNOSIS — R062 Wheezing: Secondary | ICD-10-CM | POA: Diagnosis not present

## 2023-04-04 DIAGNOSIS — R059 Cough, unspecified: Secondary | ICD-10-CM | POA: Diagnosis present

## 2023-04-04 DIAGNOSIS — R509 Fever, unspecified: Secondary | ICD-10-CM | POA: Diagnosis not present

## 2023-04-04 LAB — SARS CORONAVIRUS 2 BY RT PCR: SARS Coronavirus 2 by RT PCR: NEGATIVE

## 2023-04-04 NOTE — ED Triage Notes (Signed)
Complaining of fever, cough, vomiting, cold chills, stuffy head all started 2 days ago.

## 2023-04-05 ENCOUNTER — Emergency Department (HOSPITAL_COMMUNITY): Payer: Medicaid Other

## 2023-04-05 MED ORDER — DOXYCYCLINE HYCLATE 100 MG PO CAPS: 100.0000 mg | ORAL_CAPSULE | Freq: Two times a day (BID) | ORAL | 0 refills | Status: DC

## 2023-04-05 MED ORDER — ALBUTEROL SULFATE HFA 108 (90 BASE) MCG/ACT IN AERS: 2.0000 | INHALATION_SPRAY | RESPIRATORY_TRACT | 0 refills | Status: DC | PRN

## 2023-04-05 MED ORDER — BENZONATATE 100 MG PO CAPS: 100.0000 mg | ORAL_CAPSULE | Freq: Two times a day (BID) | ORAL | 0 refills | Status: DC | PRN

## 2023-04-05 MED ORDER — PREDNISONE 20 MG PO TABS: 40.0000 mg | ORAL_TABLET | Freq: Every day | ORAL | 0 refills | Status: DC

## 2023-04-05 NOTE — ED Provider Notes (Signed)
[facility] Emergency Department Provider Note MRN:  [id number]  Arrival date & time: 04/05/23     Chief Complaint   Fever   History of Present Illness   [name] is a 62 y.o. year-old female presents to the ED with chief complaint of cough x 2 weeks.  Hx of smoking. Reports subjective fevers and chills. She states that the phlegm has been greens.  States that sometimes she coughs so hard she feels like she is going to pass out.  Reports some wheezing.  History provided by patient.   Review of Systems  Pertinent positive and negative review of systems noted in HPI.    Physical Exam   Vitals:   04/04/23 2226 04/05/23 0234  BP: (!) 165/85 (!) 147/77  Pulse: (!) 107 94  Resp: 18 20  Temp: 98.9 F (37.2 C) 98.4 F (36.9 C)  SpO2: 96% 97%    CONSTITUTIONAL:  non toxic-appearing, NAD NEURO:  Alert and oriented x 3, CN 3-12 grossly intact EYES:  eyes equal and reactive ENT/NECK:  Supple, no stridor  CARDIO:  normal rate, regular rhythm, appears well-perfused  PULM:  No respiratory distress, mild end expiratory wheezing GI/GU:  non-distended,  MSK/SPINE:  No gross deformities, no edema, moves all extremities  SKIN:  no rash, atraumatic   *Additional and/or pertinent findings included in MDM below  Diagnostic and Interventional Summary    EKG Interpretation Date/Time:    Ventricular Rate:    PR Interval:    QRS Duration:    QT Interval:    QTC Calculation:   R Axis:      Text Interpretation:         Labs Reviewed  SARS CORONAVIRUS 2 BY RT PCR    DG Chest 2 View  Final Result      Medications - No data to display   Procedures  /  Critical Care Procedures  ED Course and Medical Decision Making  I have reviewed the triage vital signs, the nursing notes, and pertinent available records from the EMR.  Social Determinants Affecting Complexity of Care: Patient has no clinically significant social determinants affecting  this chief complaint..   ED Course:    Medical Decision Making Amount and/or Complexity of Data Reviewed Radiology: ordered.  Risk Prescription drug management.         Consultants: No consultations were needed in caring for this patient.   Treatment and Plan: Emergency department workup does not suggest an emergent condition requiring admission or immediate intervention beyond  what has been performed at this time. The patient is safe for discharge and has  been instructed to return immediately for worsening symptoms, change in  symptoms or any other concerns    Final Clinical Impressions(s) / ED Diagnoses     ICD-10-CM   1. Acute cough  R05.1       ED Discharge Orders          Ordered    doxycycline (VIBRAMYCIN) 100 MG capsule  2 times daily        04/05/23 0350    benzonatate (TESSALON) 100 MG capsule  2 times daily PRN        04/05/23 0350    albuterol (VENTOLIN HFA) 108 (90 Base) MCG/ACT inhaler  Every 4 hours PRN        04/05/23 0350    predniSONE (DELTASONE) 20 MG tablet  Daily        04/05/23 0350  Discharge Instructions Discussed with and Provided to Patient:   Discharge Instructions   None      Roxy Horseman, [name] 04/05/23 8295    Mesner, Barbara Cower, MD 04/06/23 [phone]

## 2023-04-11 ENCOUNTER — Other Ambulatory Visit: Payer: Self-pay | Admitting: Family Medicine

## 2023-04-12 ENCOUNTER — Observation Stay (HOSPITAL_COMMUNITY)
Admission: EM | Admit: 2023-04-12 | Discharge: 2023-04-14 | Disposition: A | Discharge status: Home / Self Care | Payer: Medicaid Other | Attending: Internal Medicine | Admitting: Internal Medicine

## 2023-04-12 ENCOUNTER — Other Ambulatory Visit: Payer: Self-pay

## 2023-04-12 ENCOUNTER — Encounter (HOSPITAL_COMMUNITY): Payer: Self-pay

## 2023-04-12 DIAGNOSIS — I5032 Chronic diastolic (congestive) heart failure: Secondary | ICD-10-CM

## 2023-04-12 DIAGNOSIS — I110 Hypertensive heart disease with heart failure: Secondary | ICD-10-CM | POA: Diagnosis not present

## 2023-04-12 DIAGNOSIS — G459 Transient cerebral ischemic attack, unspecified: Secondary | ICD-10-CM | POA: Diagnosis not present

## 2023-04-12 DIAGNOSIS — J449 Chronic obstructive pulmonary disease, unspecified: Secondary | ICD-10-CM | POA: Insufficient documentation

## 2023-04-12 DIAGNOSIS — R29810 Facial weakness: Secondary | ICD-10-CM | POA: Diagnosis present

## 2023-04-12 DIAGNOSIS — I5022 Chronic systolic (congestive) heart failure: Secondary | ICD-10-CM | POA: Diagnosis not present

## 2023-04-12 DIAGNOSIS — Z79899 Other long term (current) drug therapy: Secondary | ICD-10-CM | POA: Diagnosis not present

## 2023-04-12 DIAGNOSIS — I428 Other cardiomyopathies: Secondary | ICD-10-CM

## 2023-04-12 DIAGNOSIS — F17290 Nicotine dependence, other tobacco product, uncomplicated: Secondary | ICD-10-CM | POA: Diagnosis not present

## 2023-04-12 DIAGNOSIS — E785 Hyperlipidemia, unspecified: Secondary | ICD-10-CM | POA: Diagnosis not present

## 2023-04-12 DIAGNOSIS — G8929 Other chronic pain: Secondary | ICD-10-CM | POA: Diagnosis not present

## 2023-04-12 DIAGNOSIS — Z794 Long term (current) use of insulin: Secondary | ICD-10-CM | POA: Insufficient documentation

## 2023-04-12 DIAGNOSIS — E1140 Type 2 diabetes mellitus with diabetic neuropathy, unspecified: Secondary | ICD-10-CM | POA: Diagnosis not present

## 2023-04-12 DIAGNOSIS — I10 Essential (primary) hypertension: Secondary | ICD-10-CM | POA: Insufficient documentation

## 2023-04-12 DIAGNOSIS — E1165 Type 2 diabetes mellitus with hyperglycemia: Secondary | ICD-10-CM | POA: Diagnosis not present

## 2023-04-12 DIAGNOSIS — Z91040 Latex allergy status: Secondary | ICD-10-CM | POA: Insufficient documentation

## 2023-04-12 DIAGNOSIS — K219 Gastro-esophageal reflux disease without esophagitis: Secondary | ICD-10-CM | POA: Diagnosis present

## 2023-04-12 DIAGNOSIS — E119 Type 2 diabetes mellitus without complications: Secondary | ICD-10-CM

## 2023-04-12 NOTE — ED Provider Notes (Signed)
[place] EMERGENCY DEPARTMENT AT [facility] Provider Note   CSN: [id number] Arrival date & time: 04/12/23  2319     History  Chief Complaint  Patient presents with   Numbness    [name] is a 62 y.o. female.  The history is provided by the patient.  She has history of hypertension, diabetes, heart failure, COPD and comes in because of an episode of transient facial numbness and numbness of right arm.  She was watching television at about 930 when she noted that her face was numb in her cheeks and chin but not in her forehead.  It was bilateral.  She also noted some numbness in her right arm.  She noticed that when she tried to sip some coffee that she could not feel it on her lips.  She did not notice any weakness.  She denies any headache.  Symptoms resolved at about the time she arrived in the ED, lasting a total of about 2 hours.  She has never had episode like this before.   Home Medications Prior to Admission medications   Medication Sig Start Date End Date Taking? Authorizing Provider  ACCU-CHEK GUIDE test strip USE AS DIRECTED IN THE MORNING, AT NOON, IN THE EVENING AND AT BEDTIME 08/30/22   Shamleffer, Konrad Dolores, MD  Accu-Chek Softclix Lancets lancets Use to check blood sugar THREE TIMES DAILY 06/09/21   Pollyann Savoy, MD  albuterol (VENTOLIN HFA) 108 (90 Base) MCG/ACT inhaler Inhale 2 puffs into the lungs every 4 (four) hours as needed for wheezing or shortness of breath. 12/15/21   Hoy Register, MD  albuterol (VENTOLIN HFA) 108 (90 Base) MCG/ACT inhaler Inhale 2 puffs into the lungs every 4 (four) hours as needed for wheezing or shortness of breath. 04/05/23   Roxy Horseman, [name]  amitriptyline (ELAVIL) 50 MG tablet Take 1 tablet (50 mg total) by mouth at bedtime. 08/23/22 08/18/23  Windell Norfolk, MD  BD PEN NEEDLE NANO 2ND GEN 32G X 4 MM MISC USE 1 PEN NEEDLE UNDER THE SKIN FOUR TIMES DAILY 12/27/22   Shamleffer, Konrad Dolores, MD   benzonatate (TESSALON) 100 MG capsule Take 1 capsule (100 mg total) by mouth 2 (two) times daily as needed for cough. 04/05/23   Roxy Horseman, [name]  Blood Glucose Monitoring Suppl (ACCU-CHEK GUIDE ME) w/Device KIT Use to check blood sugar TID. 01/30/21   Hoy Register, MD  cetirizine (ZYRTEC) 10 MG tablet TAKE 1 TABLET(10 MG) BY MOUTH DAILY 08/10/22   Hoy Register, MD  Continuous Glucose Sensor (DEXCOM G6 SENSOR) MISC USE 1 DEVICE AS DIRECTED 10/22/22   Shamleffer, Konrad Dolores, MD  Continuous Glucose Transmitter (DEXCOM G6 TRANSMITTER) MISC USE AS DIRECTED 11/02/22   Shamleffer, Konrad Dolores, MD  dicyclomine (BENTYL) 10 MG capsule TAKE ONE CAPSULE BY MOUTH THREE TIMES DAILY BEFORE MEALS 03/16/23   Hoy Register, MD  diphenoxylate-atropine (LOMOTIL) 2.5-0.025 MG tablet TAKE ONE TABLET BY MOUTH TWICE DAILY 03/17/23   Hoy Register, MD  doxycycline (VIBRAMYCIN) 100 MG capsule Take 1 capsule (100 mg total) by mouth 2 (two) times daily. 04/05/23   Roxy Horseman, [name]  ergocalciferol (DRISDOL) 1.25 MG (50000 UT) capsule Take 1 capsule (50,000 Units total) by mouth once a week. Patient not taking: Reported on 03/16/2023 03/11/21   Hoy Register, MD  fenofibrate (TRICOR) 145 MG tablet TAKE ONE TABLET BY MOUTH EVERY DAY 04/12/23   Hoy Register, MD  FLUoxetine (PROZAC) 40 MG capsule TAKE ONE CAPSULE BY MOUTH AT BEDTIME 03/18/23  Hoy Register, MD  hydrOXYzine (ATARAX) 10 MG tablet Take 1 tablet (10 mg total) by mouth 3 (three) times daily as needed. 03/16/23   Hoy Register, MD  Insulin Disposable Pump (OMNIPOD 5 G6 PODS, GEN 5,) MISC USE 1 DEVICE EVERY 3 DAYS 10/22/22   Shamleffer, Konrad Dolores, MD  Lancet Devices [facility]) lancets Use as instructed daily. 11/24/16   Hoy Register, MD  LANTUS SOLOSTAR 100 UNIT/ML Solostar Pen INJECT 24 UNITS UNDER THE SKIN DAILY 12/27/22   Shamleffer, Konrad Dolores, MD  meclizine (ANTIVERT) 25 MG tablet Take 1 tablet (25 mg total) by mouth 3  (three) times daily as needed for dizziness. 03/16/23   Hoy Register, MD  metoprolol succinate (TOPROL-XL) 25 MG 24 hr tablet Take 25 mg by mouth daily. 12/03/21   [provider]  mupirocin ointment (BACTROBAN) 2 % Apply 1 Application topically 2 (two) times daily. 06/03/22   Hoy Register, MD  NOVOLOG FLEXPEN 100 UNIT/ML FlexPen Max Daily 50 units per correction scale 06/08/22   Shamleffer, Konrad Dolores, MD  omeprazole (PRILOSEC) 40 MG capsule Take 1 capsule (40 mg total) by mouth daily. 03/16/23   Hoy Register, MD  predniSONE (DELTASONE) 20 MG tablet Take 2 tablets (40 mg total) by mouth daily. 04/05/23   Roxy Horseman, [name]  pregabalin (LYRICA) 150 MG capsule TAKE ONE CAPSULE BY MOUTH TWICE DAILY 03/14/23   Penumalli, Glenford Bayley, MD  promethazine (PHENERGAN) 25 MG tablet 1/2-1 tab every 4 to 6 hours prn nausea 03/08/22   Hoy Register, MD  rosuvastatin (CRESTOR) 10 MG tablet TAKE ONE TABLET BY MOUTH EVERY DAY 03/18/23   Hoy Register, MD  Tiotropium Bromide Monohydrate (SPIRIVA RESPIMAT) 1.25 MCG/ACT AERS Inhale 2 puffs into the lungs daily. 01/06/23   Leslye Peer, MD      Allergies    Biaxin [clarithromycin], Clarithromycin, Lisinopril, Sulfa antibiotics, Chlorthalidone, Daptomycin, Latex, Amoxicillin-pot clavulanate, Aspirin, Cholestyramine, Dilaudid [hydromorphone hcl], and Lasix [furosemide]    Review of Systems   Review of Systems  All other systems reviewed and are negative.   Physical Exam Updated Vital Signs BP (!) 148/94 (BP Location: Right Arm)   Pulse 86   Temp 98.3 F (36.8 C) (Oral)   Resp 18   Ht 5\' 7"  (1.702 m)   Wt 68 kg   LMP 06/09/2011 Comment: verified BEFORE imaging  SpO2 96%   BMI 23.49 kg/m  Physical Exam Vitals and nursing note reviewed.   62 year old female, resting comfortably and in no acute distress. Vital signs are significant for elevated blood pressure. Oxygen saturation is 96%, which is normal. Head is normocephalic and  atraumatic. PERRLA, EOMI. Oropharynx is clear. Neck is nontender and supple without adenopathy or JVD.  There are no carotid bruits. Lungs are clear without rales, wheezes, or rhonchi. Chest is nontender. Heart has regular rate and rhythm without murmur. Abdomen is soft, flat, nontender. Extremities have no cyanosis or edema, full range of motion is present. Skin is warm and dry without rash. Neurologic: Mental status is normal, there is no facial asymmetry, tongue protrudes in the midline, soft palate moves symmetrically, normal sensation throughout the face including all 3 divisions of the trigeminal nerve, symmetric shrug.  Strength is 5/5 in all 4 extremities, no pronator drift.  There is no extinction on double simultaneous stimulation.  Careful sensory exam shows no deficits.  ED Results / Procedures / Treatments   Labs (all labs ordered are listed, but only abnormal results are displayed) Labs Reviewed -  No data to display  EKG None  Radiology No results found.  Procedures Procedures  Cardiac monitor shows normal sinus rhythm with occasional PAC, per my interpretation.  Medications Ordered in ED Medications - No data to display  ED Course/ Medical Decision Making/ A&P                                 Medical Decision Making Amount and/or Complexity of Data Reviewed Labs: ordered. Radiology: ordered.  Risk Decision regarding hospitalization.   Transient numbness concerning for transient ischemic attack.  Although she should not have bilateral facial numbness, I was not able to assess her during the time that she was numb and I am not sure that her facial numbness was not actually unilateral.  She does have significant risk factors for cerebrovascular disease and carotid artery disease, so I will proceed with evaluation as if this was a transient ischemic attack.  I have reviewed her past records, and she has no relevant past visits.  I have reviewed her laboratory  tests, and my interpretation is mild leukocytosis which is nonspecific, elevated glucose consistent with known history of diabetes, undetectable ethanol level, urinalysis significant only for presence of glucose, drug screen negative.  CT of head shows no acute intracranial abnormality.  Have independently viewed the images, and agree with radiologist's interpretation.  I have discussed the case with Dr. Loney Loh of [facility], who agrees to admit the patient.  Final Clinical Impression(s) / ED Diagnoses Final diagnoses:  TIA (transient ischemic attack)    Rx / DC Orders ED Discharge Orders     None         Dione Booze, MD 04/13/23 [phone]

## 2023-04-12 NOTE — Telephone Encounter (Signed)
Requested Prescriptions  Pending Prescriptions Disp Refills   fenofibrate (TRICOR) 145 MG tablet [Pharmacy Med Name: fenofibrate nanocrystallized 145 mg tablet] 90 tablet 0    Sig: TAKE ONE TABLET BY MOUTH EVERY DAY     Cardiovascular:  Antilipid - Fibric Acid Derivatives Failed - 04/11/2023 10:07 AM      Failed - Lipid Panel in normal range within the last 12 months    Cholesterol, Total  Date Value Ref Range Status  03/21/2023 135 100 - 199 mg/dL Final   LDL Chol Calc (NIH)  Date Value Ref Range Status  03/21/2023 79 0 - 99 mg/dL Final   HDL  Date Value Ref Range Status  03/21/2023 34 (L) >39 mg/dL Final   Triglycerides  Date Value Ref Range Status  03/21/2023 122 0 - 149 mg/dL Final         Passed - ALT in normal range and within 360 days    ALT  Date Value Ref Range Status  03/21/2023 12 0 - 32 IU/L Final         Passed - AST in normal range and within 360 days    AST  Date Value Ref Range Status  03/21/2023 21 0 - 40 IU/L Final         Passed - Cr in normal range and within 360 days    Creat  Date Value Ref Range Status  07/05/2016 0.59 0.50 - 1.05 mg/dL Final    Comment:      For patients > or = 61 years of age: The upper reference limit for Creatinine is approximately 13% higher for people identified as African-American.      Creatinine, Ser  Date Value Ref Range Status  03/21/2023 0.64 0.57 - 1.00 mg/dL Final         Passed - HGB in normal range and within 360 days    Hemoglobin  Date Value Ref Range Status  04/26/2022 14.1 11.7 - 15.5 g/dL Final  07/06/3233 57.3 11.1 - 15.9 g/dL Final         Passed - HCT in normal range and within 360 days    HCT  Date Value Ref Range Status  04/26/2022 42.7 35.0 - 45.0 % Final   Hematocrit  Date Value Ref Range Status  11/25/2021 45.1 34.0 - 46.6 % Final         Passed - PLT in normal range and within 360 days    Platelets  Date Value Ref Range Status  04/26/2022 164 140 - 400 Thousand/uL Final   11/25/2021 190 150 - 450 x10E3/uL Final         Passed - WBC in normal range and within 360 days    WBC  Date Value Ref Range Status  04/26/2022 9.4 3.8 - 10.8 Thousand/uL Final         Passed - eGFR is 30 or above and within 360 days    GFR, Est African American  Date Value Ref Range Status  07/05/2016 >89 >=60 mL/min Final   GFR calc Af Amer  Date Value Ref Range Status  07/11/2020 114 >59 mL/min/1.73 Final    Comment:    **In accordance with recommendations from the NKF-ASN Task force,**   [facility] is in the process of updating its eGFR calculation to the   2021 CKD-EPI creatinine equation that estimates kidney function   without a race variable.    GFR, Est Non African American  Date Value Ref Range Status  07/05/2016 >89 >=  60 mL/min Final   GFR, Estimated  Date Value Ref Range Status  11/05/2021 >60 >60 mL/min Final    Comment:    (NOTE) Calculated using the CKD-EPI Creatinine Equation (2021)    eGFR  Date Value Ref Range Status  03/21/2023 100 >59 mL/min/1.73 Final         Passed - Valid encounter within last 12 months    Recent Outpatient Visits           3 weeks ago Neck pain   [place] [facility] [place], Odette Horns, MD   10 months ago Smoking greater than 20 pack years   [facility] Hoy Register, MD   1 year ago Dizziness   [place] [facility] [place], Odette Horns, MD   1 year ago Type 2 diabetes mellitus with other neurologic complication, with long-term current use of insulin [facility])   [place] [facility] [place], [place], [place]   1 year ago Type 2 diabetes mellitus with other neurologic complication, with long-term current use of insulin [facility])   [place] [facility] Hoy Register, MD       Future Appointments             In 1 month Byrum, Les Pou, MD [facility] [place]   In 5  months Hoy Register, MD [facility]

## 2023-04-12 NOTE — ED Provider Notes (Incomplete)
[place] EMERGENCY DEPARTMENT AT [facility] Provider Note   CSN: [id number] Arrival date & time: 04/12/23  2319     History {Add pertinent medical, surgical, social history, OB history to HPI:1} Chief Complaint  Patient presents with  . Numbness    [name] is a 62 y.o. female.  The history is provided by the patient.  She has history of hypertension, diabetes, heart failure, COPD,   Home Medications Prior to Admission medications   Medication Sig Start Date End Date Taking? Authorizing Provider  ACCU-CHEK GUIDE test strip USE AS DIRECTED IN THE MORNING, AT NOON, IN THE EVENING AND AT BEDTIME 08/30/22   Shamleffer, Konrad Dolores, MD  Accu-Chek Softclix Lancets lancets Use to check blood sugar THREE TIMES DAILY 06/09/21   Pollyann Savoy, MD  albuterol (VENTOLIN HFA) 108 (90 Base) MCG/ACT inhaler Inhale 2 puffs into the lungs every 4 (four) hours as needed for wheezing or shortness of breath. 12/15/21   Hoy Register, MD  albuterol (VENTOLIN HFA) 108 (90 Base) MCG/ACT inhaler Inhale 2 puffs into the lungs every 4 (four) hours as needed for wheezing or shortness of breath. 04/05/23   Roxy Horseman, [name]  amitriptyline (ELAVIL) 50 MG tablet Take 1 tablet (50 mg total) by mouth at bedtime. 08/23/22 08/18/23  Windell Norfolk, MD  BD PEN NEEDLE NANO 2ND GEN 32G X 4 MM MISC USE 1 PEN NEEDLE UNDER THE SKIN FOUR TIMES DAILY 12/27/22   Shamleffer, Konrad Dolores, MD  benzonatate (TESSALON) 100 MG capsule Take 1 capsule (100 mg total) by mouth 2 (two) times daily as needed for cough. 04/05/23   Roxy Horseman, [name]  Blood Glucose Monitoring Suppl (ACCU-CHEK GUIDE ME) w/Device KIT Use to check blood sugar TID. 01/30/21   Hoy Register, MD  cetirizine (ZYRTEC) 10 MG tablet TAKE 1 TABLET(10 MG) BY MOUTH DAILY 08/10/22   Hoy Register, MD  Continuous Glucose Sensor (DEXCOM G6 SENSOR) MISC USE 1 DEVICE AS DIRECTED 10/22/22   Shamleffer, Konrad Dolores, MD  Continuous  Glucose Transmitter (DEXCOM G6 TRANSMITTER) MISC USE AS DIRECTED 11/02/22   Shamleffer, Konrad Dolores, MD  dicyclomine (BENTYL) 10 MG capsule TAKE ONE CAPSULE BY MOUTH THREE TIMES DAILY BEFORE MEALS 03/16/23   Hoy Register, MD  diphenoxylate-atropine (LOMOTIL) 2.5-0.025 MG tablet TAKE ONE TABLET BY MOUTH TWICE DAILY 03/17/23   Hoy Register, MD  doxycycline (VIBRAMYCIN) 100 MG capsule Take 1 capsule (100 mg total) by mouth 2 (two) times daily. 04/05/23   Roxy Horseman, [name]  ergocalciferol (DRISDOL) 1.25 MG (50000 UT) capsule Take 1 capsule (50,000 Units total) by mouth once a week. Patient not taking: Reported on 03/16/2023 03/11/21   Hoy Register, MD  fenofibrate (TRICOR) 145 MG tablet TAKE ONE TABLET BY MOUTH EVERY DAY 04/12/23   Hoy Register, MD  FLUoxetine (PROZAC) 40 MG capsule TAKE ONE CAPSULE BY MOUTH AT BEDTIME 03/18/23   Hoy Register, MD  hydrOXYzine (ATARAX) 10 MG tablet Take 1 tablet (10 mg total) by mouth 3 (three) times daily as needed. 03/16/23   Hoy Register, MD  Insulin Disposable Pump (OMNIPOD 5 G6 PODS, GEN 5,) MISC USE 1 DEVICE EVERY 3 DAYS 10/22/22   Shamleffer, Konrad Dolores, MD  Lancet Devices [facility]) lancets Use as instructed daily. 11/24/16   Hoy Register, MD  LANTUS SOLOSTAR 100 UNIT/ML Solostar Pen INJECT 24 UNITS UNDER THE SKIN DAILY 12/27/22   Shamleffer, Konrad Dolores, MD  meclizine (ANTIVERT) 25 MG tablet Take 1 tablet (25 mg total) by mouth 3 (  three) times daily as needed for dizziness. 03/16/23   Hoy Register, MD  metoprolol succinate (TOPROL-XL) 25 MG 24 hr tablet Take 25 mg by mouth daily. 12/03/21   [provider]  mupirocin ointment (BACTROBAN) 2 % Apply 1 Application topically 2 (two) times daily. 06/03/22   Hoy Register, MD  NOVOLOG FLEXPEN 100 UNIT/ML FlexPen Max Daily 50 units per correction scale 06/08/22   Shamleffer, Konrad Dolores, MD  omeprazole (PRILOSEC) 40 MG capsule Take 1 capsule (40 mg total) by mouth daily.  03/16/23   Hoy Register, MD  predniSONE (DELTASONE) 20 MG tablet Take 2 tablets (40 mg total) by mouth daily. 04/05/23   Roxy Horseman, [name]  pregabalin (LYRICA) 150 MG capsule TAKE ONE CAPSULE BY MOUTH TWICE DAILY 03/14/23   Penumalli, Glenford Bayley, MD  promethazine (PHENERGAN) 25 MG tablet 1/2-1 tab every 4 to 6 hours prn nausea 03/08/22   Hoy Register, MD  rosuvastatin (CRESTOR) 10 MG tablet TAKE ONE TABLET BY MOUTH EVERY DAY 03/18/23   Hoy Register, MD  Tiotropium Bromide Monohydrate (SPIRIVA RESPIMAT) 1.25 MCG/ACT AERS Inhale 2 puffs into the lungs daily. 01/06/23   Leslye Peer, MD      Allergies    Biaxin [clarithromycin], Clarithromycin, Lisinopril, Sulfa antibiotics, Chlorthalidone, Daptomycin, Latex, Amoxicillin-pot clavulanate, Aspirin, Cholestyramine, Dilaudid [hydromorphone hcl], and Lasix [furosemide]    Review of Systems   Review of Systems  All other systems reviewed and are negative.   Physical Exam Updated Vital Signs BP (!) 148/94 (BP Location: Right Arm)   Pulse 86   Temp 98.3 F (36.8 C) (Oral)   Resp 18   Ht 5\' 7"  (1.702 m)   Wt 68 kg   LMP 06/09/2011 Comment: verified BEFORE imaging  SpO2 96%   BMI 23.49 kg/m  Physical Exam Vitals and nursing note reviewed.   62 year old female, resting comfortably and in no acute distress. Vital signs are ***. Oxygen saturation is ***%, which is normal. Head is normocephalic and atraumatic. PERRLA, EOMI. Oropharynx is clear. Neck is nontender and supple without adenopathy or JVD. Back is nontender and there is no CVA tenderness. Lungs are clear without rales, wheezes, or rhonchi. Chest is nontender. Heart has regular rate and rhythm without murmur. Abdomen is soft, flat, nontender without masses or hepatosplenomegaly and peristalsis is normoactive. Extremities have no cyanosis or edema, full range of motion is present. Skin is warm and dry without rash. Neurologic: Mental status is normal, cranial nerves are  intact, there are no motor or sensory deficits.  ED Results / Procedures / Treatments   Labs (all labs ordered are listed, but only abnormal results are displayed) Labs Reviewed - No data to display  EKG None  Radiology No results found.  Procedures Procedures  {Document cardiac monitor, telemetry assessment procedure when appropriate:1}  Medications Ordered in ED Medications - No data to display  ED Course/ Medical Decision Making/ A&P   {   Click here for ABCD2, HEART and other calculatorsREFRESH Note before signing :1}                              Medical Decision Making  ***  {Document critical care time when appropriate:1} {Document review of labs and clinical decision tools ie heart score, Chads2Vasc2 etc:1}  {Document your independent review of radiology images, and any outside records:1} {Document your discussion with family members, caretakers, and with consultants:1} {Document social determinants of health affecting pt's care:1} {  Document your decision making why or why not admission, treatments were needed:1} Final Clinical Impression(s) / ED Diagnoses Final diagnoses:  None    Rx / DC Orders ED Discharge Orders     None

## 2023-04-12 NOTE — ED Triage Notes (Addendum)
Pt complaining of the right arm and face going numb about an hour ago. Since ems arrived at her home the numbness has started to go away. She still has some around her mouth. Pt also has a headache and feels dizzy.

## 2023-04-13 ENCOUNTER — Observation Stay (HOSPITAL_COMMUNITY): Payer: Medicaid Other

## 2023-04-13 ENCOUNTER — Observation Stay (HOSPITAL_BASED_OUTPATIENT_CLINIC_OR_DEPARTMENT_OTHER): Payer: Medicaid Other

## 2023-04-13 ENCOUNTER — Telehealth: Payer: Self-pay | Admitting: Cardiology

## 2023-04-13 ENCOUNTER — Emergency Department (HOSPITAL_COMMUNITY): Payer: Medicaid Other

## 2023-04-13 DIAGNOSIS — I5032 Chronic diastolic (congestive) heart failure: Secondary | ICD-10-CM

## 2023-04-13 DIAGNOSIS — G459 Transient cerebral ischemic attack, unspecified: Secondary | ICD-10-CM | POA: Diagnosis not present

## 2023-04-13 DIAGNOSIS — E1165 Type 2 diabetes mellitus with hyperglycemia: Secondary | ICD-10-CM | POA: Diagnosis not present

## 2023-04-13 DIAGNOSIS — I6340 Cerebral infarction due to embolism of unspecified cerebral artery: Secondary | ICD-10-CM

## 2023-04-13 DIAGNOSIS — I447 Left bundle-branch block, unspecified: Secondary | ICD-10-CM

## 2023-04-13 DIAGNOSIS — I428 Other cardiomyopathies: Secondary | ICD-10-CM

## 2023-04-13 DIAGNOSIS — R002 Palpitations: Secondary | ICD-10-CM

## 2023-04-13 LAB — COMPREHENSIVE METABOLIC PANEL
ALT: 16 U/L (ref 0–44)
AST: 14 U/L — ABNORMAL LOW (ref 15–41)
Albumin: 3.2 g/dL — ABNORMAL LOW (ref 3.5–5.0)
Alkaline Phosphatase: 82 U/L (ref 38–126)
Anion gap: 8 (ref 5–15)
BUN: 12 mg/dL (ref 8–23)
CO2: 25 mmol/L (ref 22–32)
Calcium: 8.7 mg/dL — ABNORMAL LOW (ref 8.9–10.3)
Chloride: 102 mmol/L (ref 98–111)
Creatinine, Ser: 0.61 mg/dL (ref 0.44–1.00)
GFR, Estimated: >60 mL/min (ref >60)
Glucose, Bld: 220 mg/dL — ABNORMAL HIGH (ref 70–99)
Potassium: 3.8 mmol/L (ref 3.5–5.1)
Sodium: 135 mmol/L (ref 135–145)
Total Bilirubin: 0.5 mg/dL (ref 0.3–1.2)
Total Protein: 6.5 g/dL (ref 6.5–8.1)

## 2023-04-13 LAB — APTT: aPTT: 27 s (ref 24–36)

## 2023-04-13 LAB — CBC
HCT: 43.7 % (ref 36.0–46.0)
Hemoglobin: 14.4 g/dL (ref 12.0–15.0)
MCH: 28 pg (ref 26.0–34.0)
MCHC: 33 g/dL (ref 30.0–36.0)
MCV: 84.9 fL (ref 80.0–100.0)
Platelets: 162 10*3/uL (ref 150–400)
RBC: 5.15 MIL/uL — ABNORMAL HIGH (ref 3.87–5.11)
RDW: 13.4 % (ref 11.5–15.5)
WBC: 8.4 10*3/uL (ref 4.0–10.5)
nRBC: 0 % (ref 0.0–0.2)

## 2023-04-13 LAB — CBC WITH DIFFERENTIAL/PLATELET
Abs Immature Granulocytes: 0.07 10*3/uL (ref 0.00–0.07)
Basophils Absolute: 0.1 10*3/uL (ref 0.0–0.1)
Basophils Relative: 0 %
Eosinophils Absolute: 0.3 10*3/uL (ref 0.0–0.5)
Eosinophils Relative: 3 %
HCT: 44.8 % (ref 36.0–46.0)
Hemoglobin: 14.5 g/dL (ref 12.0–15.0)
Immature Granulocytes: 1 %
Lymphocytes Relative: 30 %
Lymphs Abs: 3.5 10*3/uL (ref 0.7–4.0)
MCH: 27.2 pg (ref 26.0–34.0)
MCHC: 32.4 g/dL (ref 30.0–36.0)
MCV: 84.1 fL (ref 80.0–100.0)
Monocytes Absolute: 0.5 10*3/uL (ref 0.1–1.0)
Monocytes Relative: 4 %
Neutro Abs: 7.3 10*3/uL (ref 1.7–7.7)
Neutrophils Relative %: 62 %
Platelets: 183 10*3/uL (ref 150–400)
RBC: 5.33 MIL/uL — ABNORMAL HIGH (ref 3.87–5.11)
RDW: 13.3 % (ref 11.5–15.5)
WBC: 11.8 10*3/uL — ABNORMAL HIGH (ref 4.0–10.5)
nRBC: 0 % (ref 0.0–0.2)

## 2023-04-13 LAB — URINALYSIS, ROUTINE W REFLEX MICROSCOPIC
Bilirubin Urine: NEGATIVE
Glucose, UA: >=500 mg/dL — AB
Hgb urine dipstick: NEGATIVE
Ketones, ur: NEGATIVE mg/dL
Leukocytes,Ua: NEGATIVE
Nitrite: NEGATIVE
Protein, ur: NEGATIVE mg/dL
Specific Gravity, Urine: 1.02 (ref 1.005–1.030)
pH: 5 (ref 5.0–8.0)

## 2023-04-13 LAB — ECHOCARDIOGRAM COMPLETE
AR max vel: 2.07 cm2
AV Peak grad: 7 mm[Hg]
Ao pk vel: 1.33 m/s
Area-P 1/2: 4.39 cm2
Height: 67 in
MV M vel: 2.51 m/s
MV Peak grad: 25.2 mm[Hg]
S' Lateral: 3.65 cm
Weight: 2400 [oz_av]

## 2023-04-13 LAB — GLUCOSE, CAPILLARY
Glucose-Capillary: 169 mg/dL — ABNORMAL HIGH (ref 70–99)
Glucose-Capillary: 137 mg/dL — ABNORMAL HIGH (ref 70–99)

## 2023-04-13 LAB — RAPID URINE DRUG SCREEN, HOSP PERFORMED
Amphetamines: NOT DETECTED
Barbiturates: NOT DETECTED
Benzodiazepines: NOT DETECTED
Cocaine: NOT DETECTED
Opiates: NOT DETECTED
Tetrahydrocannabinol: NOT DETECTED

## 2023-04-13 LAB — CBG MONITORING, ED: Glucose-Capillary: 122 mg/dL — ABNORMAL HIGH (ref 70–99)

## 2023-04-13 LAB — HEMOGLOBIN A1C
Hgb A1c MFr Bld: 11.4 % — ABNORMAL HIGH (ref 4.8–5.6)
Mean Plasma Glucose: 280.48 mg/dL

## 2023-04-13 LAB — PROTIME-INR
INR: 1 (ref 0.8–1.2)
Prothrombin Time: 13.3 s (ref 11.4–15.2)

## 2023-04-13 LAB — ETHANOL: Alcohol, Ethyl (B): <10 mg/dL (ref <10)

## 2023-04-13 LAB — HIV ANTIBODY (ROUTINE TESTING W REFLEX): HIV Screen 4th Generation wRfx: NONREACTIVE

## 2023-04-13 MED ORDER — ACETAMINOPHEN 160 MG/5ML PO SOLN: 650.0000 mg | Freq: Four times a day (QID) | ORAL | Status: DC | PRN

## 2023-04-13 MED ORDER — FLUOXETINE HCL 20 MG PO CAPS
40.0000 mg | ORAL_CAPSULE | Freq: Every day | ORAL | Status: DC
  Administered 2023-04-13: 40 mg via ORAL
  Filled 2023-04-13: qty 2

## 2023-04-13 MED ORDER — CLOPIDOGREL BISULFATE 300 MG PO TABS
300.0000 mg | ORAL_TABLET | Freq: Once | ORAL | Status: AC
  Administered 2023-04-13: 300 mg via ORAL
  Filled 2023-04-13: qty 1

## 2023-04-13 MED ORDER — ROSUVASTATIN CALCIUM 5 MG PO TABS
10.0000 mg | ORAL_TABLET | Freq: Every day | ORAL | Status: DC
  Administered 2023-04-13 – 2023-04-14 (×2): 10 mg via ORAL
  Filled 2023-04-13 (×2): qty 2

## 2023-04-13 MED ORDER — PANTOPRAZOLE SODIUM 40 MG PO TBEC
40.0000 mg | DELAYED_RELEASE_TABLET | Freq: Every day | ORAL | Status: DC
  Administered 2023-04-13 – 2023-04-14 (×2): 40 mg via ORAL
  Filled 2023-04-13 (×2): qty 1

## 2023-04-13 MED ORDER — DICYCLOMINE HCL 10 MG PO CAPS
10.0000 mg | ORAL_CAPSULE | Freq: Two times a day (BID) | ORAL | Status: DC
  Administered 2023-04-13 – 2023-04-14 (×3): 10 mg via ORAL
  Filled 2023-04-13 (×4): qty 1

## 2023-04-13 MED ORDER — ALPRAZOLAM 0.25 MG PO TABS
1.0000 mg | ORAL_TABLET | Freq: Once | ORAL | Status: AC
  Administered 2023-04-13: 1 mg via ORAL
  Filled 2023-04-13: qty 4

## 2023-04-13 MED ORDER — FENOFIBRATE 160 MG PO TABS
160.0000 mg | ORAL_TABLET | Freq: Every day | ORAL | Status: DC
  Administered 2023-04-13 – 2023-04-14 (×2): 160 mg via ORAL
  Filled 2023-04-13 (×2): qty 1

## 2023-04-13 MED ORDER — NICOTINE 14 MG/24HR TD PT24
14.0000 mg | MEDICATED_PATCH | Freq: Every day | TRANSDERMAL | Status: DC
  Administered 2023-04-13 – 2023-04-14 (×2): 14 mg via TRANSDERMAL
  Filled 2023-04-13 (×2): qty 1

## 2023-04-13 MED ORDER — AMITRIPTYLINE HCL 50 MG PO TABS
50.0000 mg | ORAL_TABLET | Freq: Every day | ORAL | Status: DC
  Administered 2023-04-13: 50 mg via ORAL
  Filled 2023-04-13: qty 1

## 2023-04-13 MED ORDER — HYDROXYZINE HCL 10 MG PO TABS: 10.0000 mg | ORAL_TABLET | Freq: Two times a day (BID) | ORAL | Status: DC | PRN

## 2023-04-13 MED ORDER — INSULIN GLARGINE-YFGN 100 UNIT/ML ~~LOC~~ SOLN
24.0000 [IU] | Freq: Every day | SUBCUTANEOUS | Status: DC
  Filled 2023-04-13 (×4): qty 0.24

## 2023-04-13 MED ORDER — UMECLIDINIUM BROMIDE 62.5 MCG/ACT IN AEPB
1.0000 | INHALATION_SPRAY | Freq: Every day | RESPIRATORY_TRACT | Status: DC
  Administered 2023-04-14: 1 via RESPIRATORY_TRACT
  Filled 2023-04-13: qty 7

## 2023-04-13 MED ORDER — STROKE: EARLY STAGES OF RECOVERY BOOK
Freq: Once | Status: DC
  Filled 2023-04-13: qty 1

## 2023-04-13 MED ORDER — PREGABALIN 75 MG PO CAPS
150.0000 mg | ORAL_CAPSULE | Freq: Two times a day (BID) | ORAL | Status: DC
  Administered 2023-04-13 – 2023-04-14 (×3): 150 mg via ORAL
  Filled 2023-04-13 (×3): qty 2

## 2023-04-13 MED ORDER — ALBUTEROL SULFATE (2.5 MG/3ML) 0.083% IN NEBU: 2.5000 mg | INHALATION_SOLUTION | RESPIRATORY_TRACT | Status: DC | PRN

## 2023-04-13 MED ORDER — PERFLUTREN LIPID MICROSPHERE
1.0000 mL | INTRAVENOUS | Status: AC | PRN
  Administered 2023-04-13: 2 mL via INTRAVENOUS

## 2023-04-13 MED ORDER — ACETAMINOPHEN 325 MG PO TABS: 650.0000 mg | ORAL_TABLET | Freq: Four times a day (QID) | ORAL | Status: DC | PRN

## 2023-04-13 MED ORDER — IOHEXOL 350 MG/ML SOLN
75.0000 mL | Freq: Once | INTRAVENOUS | Status: AC | PRN
  Administered 2023-04-13: 75 mL via INTRAVENOUS

## 2023-04-13 MED ORDER — INSULIN ASPART 100 UNIT/ML IJ SOLN
0.0000 [IU] | Freq: Three times a day (TID) | INTRAMUSCULAR | Status: DC
  Administered 2023-04-13: 2 [IU] via SUBCUTANEOUS

## 2023-04-13 MED ORDER — TIOTROPIUM BROMIDE MONOHYDRATE 1.25 MCG/ACT IN AERS: 2.0000 | INHALATION_SPRAY | Freq: Every day | RESPIRATORY_TRACT | Status: DC

## 2023-04-13 MED ORDER — ENOXAPARIN SODIUM 40 MG/0.4ML IJ SOSY
40.0000 mg | PREFILLED_SYRINGE | INTRAMUSCULAR | Status: DC
  Administered 2023-04-13 – 2023-04-14 (×2): 40 mg via SUBCUTANEOUS
  Filled 2023-04-13 (×2): qty 0.4

## 2023-04-13 MED ORDER — CLOPIDOGREL BISULFATE 75 MG PO TABS
75.0000 mg | ORAL_TABLET | Freq: Every day | ORAL | Status: DC
  Administered 2023-04-14: 75 mg via ORAL
  Filled 2023-04-13: qty 1

## 2023-04-13 MED ORDER — LOPERAMIDE HCL 2 MG PO CAPS
4.0000 mg | ORAL_CAPSULE | Freq: Once | ORAL | Status: AC
  Administered 2023-04-13: 4 mg via ORAL
  Filled 2023-04-13: qty 2

## 2023-04-13 MED ORDER — BENZONATATE 100 MG PO CAPS: 100.0000 mg | ORAL_CAPSULE | Freq: Two times a day (BID) | ORAL | Status: DC | PRN

## 2023-04-13 MED ORDER — ACETAMINOPHEN 650 MG RE SUPP: 650.0000 mg | Freq: Four times a day (QID) | RECTAL | Status: DC | PRN

## 2023-04-13 MED ORDER — DAPAGLIFLOZIN PROPANEDIOL 10 MG PO TABS
10.0000 mg | ORAL_TABLET | Freq: Every day | ORAL | Status: DC
  Administered 2023-04-13 – 2023-04-14 (×2): 10 mg via ORAL
  Filled 2023-04-13 (×2): qty 1

## 2023-04-13 MED ORDER — INSULIN ASPART 100 UNIT/ML IJ SOLN: 0.0000 [IU] | Freq: Every day | INTRAMUSCULAR | Status: DC

## 2023-04-13 NOTE — Inpatient Diabetes Management (Signed)
Inpatient Diabetes Program Recommendations  AACE/ADA: New Consensus Statement on Inpatient Glycemic Control (2015)  Target Ranges:  Prepandial:   less than 140 mg/dL      Peak postprandial:   less than 180 mg/dL (1-2 hours)      Critically ill patients:  140 - 180 mg/dL   Lab Results  Component Value Date   GLUCAP 122 (H) 04/13/2023   HGBA1C 11.4 (H) 04/13/2023    Review of Glycemic Control  Diabetes history: DM 2 Outpatient Diabetes medications: Lantus 24 units qhs, Humalog 10 units + SSI Current orders for Inpatient glycemic control:  Semglee 24 units Novolog 0-9 units tid + hs Farxiga 10 mg Daily  A1c 11.4% on 10/16  Spoke with pt at bedside. Pt reports being on prednisone last week for 5 days. Glucose trends went into the 300-400 range. At baseline, pt reported her last A1c obtained from her PCP, it was an 11%. Discussed current A1c. Reviewed glucose and A1c goals. Pt does not modify her diet. Pt is compliant with her medications and CBG checks everyday. Discussed importance of glucose control to prevent common complications of hyperglycemia. Reviewed lifestyle modifications with diet and exercise. Will follow glucose trends and determine insulin needs for discharge.   Thanks,  Christena Deem RN, MSN, BC-ADM Inpatient Diabetes Coordinator Team Pager [phone] (8a-5p)

## 2023-04-13 NOTE — ED Notes (Signed)
ED TO INPATIENT HANDOFF REPORT  ED Nurse Name and Phone #: Marcelino Duster [phone]  S Name/Age/Gender [name] 62 y.o. female Room/Bed: 011C/011C  Code Status   Code Status: Full Code  Home/SNF/Other Home Patient oriented to: self, place, time, and situation Is this baseline? Yes   Triage Complete: Triage complete  Chief Complaint TIA (transient ischemic attack) [G45.9]  Triage Note Pt complaining of the right arm and face going numb about an hour ago. Since ems arrived at her home the numbness has started to go away. She still has some around her mouth. Pt also has a headache and feels dizzy.   Allergies Allergies  Allergen Reactions   Biaxin [Clarithromycin] Shortness Of Breath and Swelling   Lisinopril Swelling and Cough    Lip edema   Sulfa Antibiotics Anaphylaxis, Shortness Of Breath, Swelling and Hypertension   Chlorthalidone Nausea And Vomiting and Other (See Comments)    Clammy, Tachycardia, Headache    Daptomycin Nausea And Vomiting   Latex Hives and Rash   Amoxicillin-Pot Clavulanate Diarrhea    Has patient had a PCN reaction causing immediate rash, facial/tongue/throat swelling, SOB or lightheadedness with hypotension:No Has patient had a PCN reaction causing severe rash involving mucus membranes or skin necrosis:No Has patient had a PCN reaction that required hospitalization:No Has patient had a PCN reaction occurring within THE LAST 10 YEARS.  #  #  #  YES  #  #  #  If all of the above answers are "NO", then may proceed with Cephalosporin use.    Aspirin Nausea And Vomiting and Rash    On 325mg  dosage   Cholestyramine Nausea And Vomiting   Dilaudid [Hydromorphone Hcl] Nausea And Vomiting   Lasix [Furosemide] Nausea And Vomiting and Other (See Comments)    Headache     Level of Care/Admitting Diagnosis ED Disposition     ED Disposition  Admit   Condition  --   Comment  Hospital Area: [facility] [[id number]]  Level of Care:  Telemetry Medical [104]  May place patient in observation at [facility] or [facility] if equivalent level of care is available:: Yes  Covid Evaluation: Asymptomatic - no recent exposure (last 10 days) testing not required  Diagnosis: TIA (transient ischemic attack) [960454]  Admitting Physician: John Giovanni [[id number]]  Attending Physician: John Giovanni [[id number]]          B Medical/Surgery History Past Medical History:  Diagnosis Date   Anxiety    Arthritis    Chronic systolic heart failure (HCC)    EF 20-25% ECHO 05/2015   COPD (chronic obstructive pulmonary disease) (HCC)    Depression    Diabetes mellitus without complication (HCC)    Type II   Dysrhythmia    pt unsure of name of arrythmia - " my heart rate will drop all of a sudden" -no current treatment - atrial flutter   Fibrillation, atrial (HCC)    Gallstones    GERD (gastroesophageal reflux disease)    Headache(784.0)    migraines   Hypertension    Osteomyelitis (HCC)    R ankle   Rash    arms   Past Surgical History:  Procedure Laterality Date   ANKLE FRACTURE SURGERY Right 2015   CARDIAC CATHETERIZATION N/A 06/24/2015   Procedure: Left Heart Cath and Coronary Angiography;  Surgeon: [name], MD;  Location: [facility] INVASIVE CV LAB;  Service: Cardiovascular;  Laterality: N/A;   CARPAL TUNNEL RELEASE     bil  CHOLECYSTECTOMY  11/24/2011   Procedure: LAPAROSCOPIC CHOLECYSTECTOMY WITH INTRAOPERATIVE CHOLANGIOGRAM;  Surgeon: Clovis Pu. Cornett, MD;  Location: [facility] ORS;  Service: General;  Laterality: N/A;  Laparoscopic Cholecystectomy with Cholangiogram   COLONOSCOPY     ECTOPIC PREGNANCY SURGERY  many yrs ago   HARDWARE REMOVAL Right 07/15/2015   Procedure: RIGHT ANKLE HARDWARE REMOVAL, PLACEMENT OF STIMULAN ANTIBIOTIC BEADS AND PREVENA WOUND VAC.;  Surgeon: [name], MD;  Location: [facility] OR;  Service: Orthopedics;  Laterality: Right;   TOOTH EXTRACTION N/A 12/03/2016   Procedure: DENTAL  EXTRACTIONS with Alveoloplasty;  Surgeon: Ocie Doyne, DDS;  Location: [facility] OR;  Service: Oral Surgery;  Laterality: N/A;     A IV Location/Drains/Wounds Patient Lines/Drains/Airways Status     Active Line/Drains/Airways     Name Placement date Placement time Site Days   Peripheral IV 11/05/21 18 G Anterior;Distal;Right Forearm 11/05/21  2051  Forearm  524            Intake/Output Last 24 hours No intake or output data in the 24 hours ending 04/13/23 1241  Labs/Imaging Results for orders placed or performed during the hospital encounter of 04/12/23 (from the past 48 hour(s))  CBC with Differential     Status: Abnormal   Collection Time: 04/13/23 12:06 AM  Result Value Ref Range   WBC 11.8 (H) 4.0 - 10.5 K/uL   RBC 5.33 (H) 3.87 - 5.11 MIL/uL   Hemoglobin 14.5 12.0 - 15.0 g/dL   HCT 16.1 09.6 - 04.5 %   MCV 84.1 80.0 - 100.0 fL   MCH 27.2 26.0 - 34.0 pg   MCHC 32.4 30.0 - 36.0 g/dL   RDW 40.9 81.1 - 91.4 %   Platelets 183 150 - 400 K/uL   nRBC 0.0 0.0 - 0.2 %   Neutrophils Relative % 62 %   Neutro Abs 7.3 1.7 - 7.7 K/uL   Lymphocytes Relative 30 %   Lymphs Abs 3.5 0.7 - 4.0 K/uL   Monocytes Relative 4 %   Monocytes Absolute 0.5 0.1 - 1.0 K/uL   Eosinophils Relative 3 %   Eosinophils Absolute 0.3 0.0 - 0.5 K/uL   Basophils Relative 0 %   Basophils Absolute 0.1 0.0 - 0.1 K/uL   Immature Granulocytes 1 %   Abs Immature Granulocytes 0.07 0.00 - 0.07 K/uL    Comment: Performed at [facility] Lab, [address] [place], [place] [address]  Comprehensive metabolic panel     Status: Abnormal   Collection Time: 04/13/23 12:06 AM  Result Value Ref Range   Sodium 135 135 - 145 mmol/L   Potassium 3.8 3.5 - 5.1 mmol/L   Chloride 102 98 - 111 mmol/L   CO2 25 22 - 32 mmol/L   Glucose, Bld 220 (H) 70 - 99 mg/dL    Comment: Glucose reference range applies only to samples taken after fasting for at least 8 hours.   BUN 12 8 - 23 mg/dL   Creatinine, Ser 6.21 0.44 - 1.00  mg/dL   Calcium 8.7 (L) 8.9 - 10.3 mg/dL   Total Protein 6.5 6.5 - 8.1 g/dL   Albumin 3.2 (L) 3.5 - 5.0 g/dL   AST 14 (L) 15 - 41 U/L   ALT 16 0 - 44 U/L   Alkaline Phosphatase 82 38 - 126 U/L   Total Bilirubin 0.5 0.3 - 1.2 mg/dL   GFR, Estimated >30 >86 mL/min    Comment: (NOTE) Calculated using the CKD-EPI Creatinine Equation (2021)  Anion gap 8 5 - 15    Comment: Performed at [facility], [address] [place], [place] [address]  Protime-INR     Status: None   Collection Time: 04/13/23 12:06 AM  Result Value Ref Range   Prothrombin Time 13.3 11.4 - 15.2 seconds   INR 1.0 0.8 - 1.2    Comment: (NOTE) INR goal varies based on device and disease states. Performed at [facility] Lab, [address] [place], [place] [address]   APTT     Status: None   Collection Time: 04/13/23 12:06 AM  Result Value Ref Range   aPTT 27 24 - 36 seconds    Comment: Performed at [facility] Lab, [address], [place], [place] [address]  Ethanol     Status: None   Collection Time: 04/13/23 12:06 AM  Result Value Ref Range   Alcohol, Ethyl (B) <10 <10 mg/dL    Comment: (NOTE) Lowest detectable limit for serum alcohol is 10 mg/dL.  For medical purposes only. Performed at [facility], [address], [place], [place] [address]   Urinalysis, Routine w reflex microscopic -Urine, Clean Catch     Status: Abnormal   Collection Time: 04/13/23  1:33 AM  Result Value Ref Range   Color, Urine YELLOW YELLOW   APPearance CLEAR CLEAR   Specific Gravity, Urine 1.020 1.005 - 1.030   pH 5.0 5.0 - 8.0   Glucose, UA >=500 (A) NEGATIVE mg/dL   Hgb urine dipstick NEGATIVE NEGATIVE   Bilirubin Urine NEGATIVE NEGATIVE   Ketones, ur NEGATIVE NEGATIVE mg/dL   Protein, ur NEGATIVE NEGATIVE mg/dL   Nitrite NEGATIVE NEGATIVE   Leukocytes,Ua NEGATIVE NEGATIVE   RBC / HPF 0-5 0 - 5 RBC/hpf   WBC, UA 0-5 0 - 5 WBC/hpf   Bacteria, UA RARE (A) NONE SEEN   Squamous Epithelial / HPF 0-5 0 -  5 /HPF   Mucus PRESENT    Budding Yeast PRESENT     Comment: Performed at [facility], [address] [place], [place] [address]  Urine rapid drug screen (hosp performed)     Status: None   Collection Time: 04/13/23  1:33 AM  Result Value Ref Range   Opiates NONE DETECTED NONE DETECTED   Cocaine NONE DETECTED NONE DETECTED   Benzodiazepines NONE DETECTED NONE DETECTED   Amphetamines NONE DETECTED NONE DETECTED   Tetrahydrocannabinol NONE DETECTED NONE DETECTED   Barbiturates NONE DETECTED NONE DETECTED    Comment: (NOTE) DRUG SCREEN FOR MEDICAL PURPOSES ONLY.  IF CONFIRMATION IS NEEDED FOR ANY PURPOSE, NOTIFY LAB WITHIN 5 DAYS.  LOWEST DETECTABLE LIMITS FOR URINE DRUG SCREEN Drug Class                     Cutoff (ng/mL) Amphetamine and metabolites    1000 Barbiturate and metabolites    200 Benzodiazepine                 200 Opiates and metabolites        300 Cocaine and metabolites        300 THC                            50 Performed at [facility] Lab, [address] [place], [place] [address]   CBG monitoring, ED     Status: Abnormal   Collection Time: 04/13/23  7:38 AM  Result Value Ref Range   Glucose-Capillary 122 (H) 70 - 99 mg/dL    Comment: Glucose reference range applies only to samples taken after fasting for at least 8 hours.  HIV Antibody (routine testing w rflx)     Status: None   Collection Time: 04/13/23  8:18 AM  Result Value Ref Range   HIV Screen 4th Generation wRfx Non Reactive Non Reactive    Comment: Performed at [facility], [address], [place], [place] [address]  CBC     Status: Abnormal   Collection Time: 04/13/23  8:18 AM  Result Value Ref Range   WBC 8.4 4.0 - 10.5 K/uL   RBC 5.15 (H) 3.87 - 5.11 MIL/uL   Hemoglobin 14.4 12.0 - 15.0 g/dL   HCT 56.4 33.2 - 95.1 %   MCV 84.9 80.0 - 100.0 fL   MCH 28.0 26.0 - 34.0 pg   MCHC 33.0 30.0 - 36.0 g/dL   RDW 88.4 16.6 - 06.3 %   Platelets 162 150 - 400 K/uL   nRBC 0.0 0.0 -  0.2 %    Comment: Performed at [facility] Lab, [address] [place], [place] [address]  Hemoglobin A1c     Status: Abnormal   Collection Time: 04/13/23  8:18 AM  Result Value Ref Range   Hgb A1c MFr Bld 11.4 (H) 4.8 - 5.6 %    Comment: (NOTE) Pre diabetes:          5.7%-6.4%  Diabetes:              >6.4%  Glycemic control for   <7.0% adults with diabetes    Mean Plasma Glucose 280.48 mg/dL    Comment: Performed at [facility] Lab, [address] [place], [place] [address]   MR BRAIN WO CONTRAST  Result Date: 04/13/2023 CLINICAL DATA:  TIA. Lip and right arm numbness. Inability to speak during the same episode. Symptoms now resolved. EXAM: MRI HEAD WITHOUT CONTRAST TECHNIQUE: Multiplanar, multiecho pulse sequences of the brain and surrounding structures were obtained without intravenous contrast. COMPARISON:  Brain MRI 01/31/2022 FINDINGS: Brain: Small foci of restricted diffusion in the right cerebellum, left postcentral gyrus, and right parietal cortex. On FLAIR imaging there has been prior small cortical infarcts in the left perirolandic region and in the bilateral cerebellum. No acute hemorrhage, hydrocephalus, collection, or masslike finding. Vascular: Normal flow voids. Skull and upper cervical spine: Normal marrow signal. Sinuses/Orbits: Minimal chronic bilateral mastoid opacification with negative nasopharynx. Mild mucosal thickening in the left maxillary sinus. 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 Electronically Signed   By: Tiburcio Pea M.D.   On: 04/13/2023 11:05   ECHOCARDIOGRAM COMPLETE  Result Date: 04/13/2023    ECHOCARDIOGRAM REPORT   Patient Name:   [name] Date of Exam: 04/13/2023 Medical Rec #:  [id number]           Height:       67.0 in Accession #:     [id number]          Weight:       150.0 lb Date of Birth:  22-Nov-1960           BSA:          1.790 m Patient Age:    19 years  BP:           145/85 mmHg Patient Gender: F                   HR:           71 bpm. Exam Location:  Inpatient Procedure: 2D Echo, Cardiac Doppler, Color Doppler and Intracardiac            Opacification Agent Indications:    TIA  History:        Patient has prior history of Echocardiogram examinations, most                 recent 09/15/2021. CHF, TIA and COPD, Signs/Symptoms:Chest Pain;                 Risk Factors:Hypertension, Diabetes, Current Smoker and                 Dyslipidemia.  Sonographer:    [name] RCS Referring Phys: [id number] VASUNDHRA RATHORE IMPRESSIONS  1. Septal movement consistent with LBBB. Left ventricular ejection fraction, by estimation, is 45 to 50%. The left ventricle has mildly decreased function. The left ventricle demonstrates global hypokinesis. Indeterminate diastolic filling due to E-A fusion.  2. Right ventricular systolic function is mildly reduced. The right ventricular size is normal. There is normal pulmonary artery systolic pressure. The estimated right ventricular systolic pressure is 12.5 mmHg.  3. The mitral valve is grossly normal. Trivial mitral valve regurgitation. No evidence of mitral stenosis.  4. The aortic valve is tricuspid. There is mild calcification of the aortic valve. Aortic valve regurgitation is trivial. Aortic valve sclerosis is present, with no evidence of aortic valve stenosis.  5. The inferior vena cava is normal in size with greater than 50% respiratory variability, suggesting right atrial pressure of 3 mmHg. Comparison(s): Changes from prior study are noted. The left ventricular function is worsened. FINDINGS  Left Ventricle: Septal movement consistent with LBBB. Left ventricular ejection fraction, by estimation, is 45 to 50%. The left ventricle has mildly decreased function. The left ventricle demonstrates  global hypokinesis. Definity contrast agent was given IV to delineate the left ventricular endocardial borders. The left ventricular internal cavity size was normal in size. There is no left ventricular hypertrophy. Abnormal (paradoxical) septal motion, consistent with left bundle branch block. Indeterminate diastolic filling due to E-A fusion. Right Ventricle: The right ventricular size is normal. No increase in right ventricular wall thickness. Right ventricular systolic function is mildly reduced. There is normal pulmonary artery systolic pressure. The tricuspid regurgitant velocity is 1.54 m/s, and with an assumed right atrial pressure of 3 mmHg, the estimated right ventricular systolic pressure is 12.5 mmHg. Left Atrium: Left atrial size was normal in size. Right Atrium: Right atrial size was normal in size. Pericardium: Trivial pericardial effusion is present. Mitral Valve: The mitral valve is grossly normal. Trivial mitral valve regurgitation. No evidence of mitral valve stenosis. Tricuspid Valve: The tricuspid valve is grossly normal. Tricuspid valve regurgitation is trivial. No evidence of tricuspid stenosis. Aortic Valve: The aortic valve is tricuspid. There is mild calcification of the aortic valve. Aortic valve regurgitation is trivial. Aortic valve sclerosis is present, with no evidence of aortic valve stenosis. Aortic valve peak gradient measures 7.0 mmHg. Pulmonic Valve: The pulmonic valve was grossly normal. Pulmonic valve regurgitation is not visualized. No evidence of pulmonic stenosis. Aorta: The aortic root and ascending aorta are structurally normal, with no evidence of dilitation. Venous: The inferior vena cava is normal in  size with greater than 50% respiratory variability, suggesting right atrial pressure of 3 mmHg. IAS/Shunts: The atrial septum is grossly normal.  LEFT VENTRICLE PLAX 2D LVIDd:         4.45 cm   Diastology LVIDs:         3.65 cm   LV e' medial:    5.59 cm/s LV PW:          1.10 cm   LV E/e' medial:  9.0 LV IVS:        1.05 cm   LV e' lateral:   8.24 cm/s LVOT diam:     2.10 cm   LV E/e' lateral: 6.1 LV SV:         47 LV SV Index:   26 LVOT Area:     3.46 cm  RIGHT VENTRICLE             IVC RV S prime:     12.30 cm/s  IVC diam: 1.60 cm TAPSE (M-mode): 1.3 cm LEFT ATRIUM             Index        RIGHT ATRIUM           Index LA diam:        2.95 cm 1.65 cm/m   RA Area:     11.70 cm LA Vol (A2C):   33.0 ml 18.44 ml/m  RA Volume:   24.90 ml  13.91 ml/m LA Vol (A4C):   34.4 ml 19.22 ml/m LA Biplane Vol: 36.5 ml 20.40 ml/m  AORTIC VALVE AV Area (Vmax): 2.07 cm AV Vmax:        132.50 cm/s AV Peak Grad:   7.0 mmHg LVOT Vmax:      79.23 cm/s LVOT Vmean:     51.900 cm/s LVOT VTI:       0.135 m  AORTA Ao Root diam: 3.10 cm Ao Asc diam:  3.30 cm MITRAL VALVE               TRICUSPID VALVE MV Area (PHT): 4.39 cm    TR Peak grad:   9.5 mmHg MV Decel Time: 173 msec    TR Vmax:        154.00 cm/s MR Peak grad: 25.2 mmHg MR Vmax:      251.00 cm/s  SHUNTS MV E velocity: 50.40 cm/s  Systemic VTI:  0.14 m MV A velocity: 88.30 cm/s  Systemic Diam: 2.10 cm MV E/A ratio:  0.57 Lennie Odor MD Electronically signed by Lennie Odor MD Signature Date/Time: 04/13/2023/10:36:38 AM    Final    CT Head Wo Contrast  Result Date: 04/13/2023 CLINICAL DATA:  Headache and facial numbness. EXAM: CT HEAD WITHOUT CONTRAST TECHNIQUE: Contiguous axial images were obtained from the base of the skull through the vertex without intravenous contrast. RADIATION DOSE REDUCTION: This exam was performed according to the departmental dose-optimization program which includes automated exposure control, adjustment of the mA and/or kV according to patient size and/or use of iterative reconstruction technique. COMPARISON:  None Available. FINDINGS: Brain: No evidence of acute infarction, hemorrhage, hydrocephalus, extra-axial collection or mass lesion/mass effect. Vascular: There is mild bilateral cavernous carotid artery  calcification. Skull: Normal. Negative for fracture or focal lesion. Sinuses/Orbits: Mild left maxillary sinus mucosal thickening is seen. Other: None. IMPRESSION: 1. No acute intracranial abnormality. 2. Mild left maxillary sinus disease. Electronically Signed   By: Aram Candela M.D.   On: 04/13/2023 02:05    Pending Labs [employer] (  From admission, onward)     Start     Ordered   04/14/23 0500  Lipid panel  (Labs)  Tomorrow morning,   R       Comments: Fasting    04/13/23 0708            Vitals/Pain Today's Vitals   04/13/23 0539 04/13/23 0801 04/13/23 0917 04/13/23 1045  BP:  (!) 155/86  134/85  Pulse:  77  79  Resp:  17  18  Temp:   98.2 F (36.8 C)   TempSrc:   Oral   SpO2:  91%  94%  Weight:      Height:      PainSc: 0-No pain       Isolation Precautions No active isolations  Medications Medications  clopidogrel (PLAVIX) tablet 75 mg (has no administration in time range)  fenofibrate tablet 160 mg (160 mg Oral Given 04/13/23 1034)  rosuvastatin (CRESTOR) tablet 10 mg (10 mg Oral Given 04/13/23 1034)  amitriptyline (ELAVIL) tablet 50 mg (has no administration in time range)  FLUoxetine (PROZAC) capsule 40 mg (has no administration in time range)  insulin glargine-yfgn (SEMGLEE) injection 24 Units (has no administration in time range)  pantoprazole (PROTONIX) EC tablet 40 mg (40 mg Oral Given 04/13/23 1034)  pregabalin (LYRICA) capsule 150 mg (150 mg Oral Given 04/13/23 1033)  dicyclomine (BENTYL) capsule 10 mg (10 mg Oral Given 04/13/23 0754)  albuterol (PROVENTIL) (2.5 MG/3ML) 0.083% nebulizer solution 2.5 mg (has no administration in time range)  benzonatate (TESSALON) capsule 100 mg (has no administration in time range)  Tiotropium Bromide Monohydrate AERS 2 puff (2 puffs Inhalation Not Given 04/13/23 1035)   stroke: early stages of recovery book (has no administration in time range)  acetaminophen (TYLENOL) tablet 650 mg (has no administration in  time range)    Or  acetaminophen (TYLENOL) 160 MG/5ML solution 650 mg (has no administration in time range)    Or  acetaminophen (TYLENOL) suppository 650 mg (has no administration in time range)  enoxaparin (LOVENOX) injection 40 mg (40 mg Subcutaneous Given 04/13/23 1034)  insulin aspart (novoLOG) injection 0-9 Units ( Subcutaneous Not Given 04/13/23 1130)  insulin aspart (novoLOG) injection 0-5 Units (has no administration in time range)  nicotine (NICODERM CQ - dosed in mg/24 hours) patch 14 mg (14 mg Transdermal Patch Applied 04/13/23 1034)  hydrOXYzine (ATARAX) tablet 10 mg (has no administration in time range)  perflutren lipid microspheres (DEFINITY) IV suspension (2 mLs Intravenous Given 04/13/23 0825)  clopidogrel (PLAVIX) tablet 300 mg (300 mg Oral Given 04/13/23 0754)  ALPRAZolam (XANAX) tablet 1 mg (1 mg Oral Given 04/13/23 0754)    Mobility walks     Focused Assessments Neuro Assessment Handoff:  Swallow screen pass? Yes    NIH Stroke Scale  Dizziness Present: No Headache Present: Yes Interval: Shift assessment Level of Consciousness (1a.)   : Alert, keenly responsive LOC Questions (1b. )   : Answers both questions correctly LOC Commands (1c. )   : Performs both tasks correctly Best Gaze (2. )  : Normal Visual (3. )  : No visual loss Facial Palsy (4. )    : Normal symmetrical movements Motor Arm, Left (5a. )   : No drift Motor Arm, Right (5b. ) : No drift Motor Leg, Left (6a. )  : No drift Motor Leg, Right (6b. ) : No drift Limb Ataxia (7. ): Absent Sensory (8. )  : Mild-to-moderate sensory loss, patient feels pinprick is less sharp  or is dull on the affected side, or there is a loss of superficial pain with pinprick, but patient is aware of being touched Best Language (9. )  : No aphasia Dysarthria (10. ): Normal Extinction/Inattention (11.)   : No Abnormality Complete NIHSS TOTAL: 1     Neuro Assessment: Exceptions to WDL Neuro Checks:   Other (Comment)  (q 2 hr) (04/13/23 0305)  Has TPA been given? No If patient is a Neuro Trauma and patient is going to OR before floor call report to 4N Charge nurse: [phone] or [phone]   R Recommendations: See Admitting Provider Note  Report given to:   Additional Notes: Only symptom still having is numb lips

## 2023-04-13 NOTE — Progress Notes (Signed)
  Echocardiogram 2D Echocardiogram has been performed.  [name] 04/13/2023, 8:28 AM

## 2023-04-13 NOTE — Progress Notes (Signed)
Echocardiogram 2D Echocardiogram has been performed.  [name] 04/13/2023, 8:28 AM

## 2023-04-13 NOTE — ED Notes (Signed)
Patient transported to CT 

## 2023-04-13 NOTE — Consult Note (Signed)
NEUROLOGY CONSULT NOTE   Date of service: April 13, 2023 Patient Name: [name] MRN:  [id number] DOB:  [birth year] Chief Complaint: "Numbness" Requesting Provider: John Giovanni, MD  History of Present Illness  Mickelle Goupil is a 62 y.o. female  has a past medical history of Anxiety, Arthritis, Chronic systolic heart failure (HCC), COPD (chronic obstructive pulmonary disease) (HCC), Depression, Diabetes mellitus without complication (HCC), Dysrhythmia, Fibrillation, atrial (HCC), Gallstones, GERD (gastroesophageal reflux disease), Headache(784.0), Hypertension, Osteomyelitis (HCC), and Rash. who presents with transient numbness who was in her normal state of health when she had sudden onset of numbness of her face as well as right arm numbness.  She states that she was drinking coffee, and then noticed that she was unable to feel it against her lips.  That was also when she does that her right arm was numb.  The symptoms lasted for approximately 30 to 45 minutes and then resolved.  She states that she has never had anything like this before.  She denies any tingling associated with this.  She denies any weakness, vision change, or other symptoms.   LKW: 9:30 PM Modified rankin score: 0-Completely asymptomatic and back to baseline post- stroke IV Thrombolysis: No resolution of symptoms  Past History   Past Medical History:  Diagnosis Date   Anxiety    Arthritis    Chronic systolic heart failure (HCC)    EF 20-25% ECHO 05/2015   COPD (chronic obstructive pulmonary disease) (HCC)    Depression    Diabetes mellitus without complication (HCC)    Type II   Dysrhythmia    pt unsure of name of arrythmia - " my heart rate will drop all of a sudden" -no current treatment - atrial flutter   Fibrillation, atrial (HCC)    Gallstones    GERD (gastroesophageal reflux disease)    Headache(784.0)    migraines   Hypertension    Osteomyelitis (HCC)    R ankle   Rash    arms     Past Surgical History:  Procedure Laterality Date   ANKLE FRACTURE SURGERY Right 2015   CARDIAC CATHETERIZATION N/A 06/24/2015   Procedure: Left Heart Cath and Coronary Angiography;  Surgeon: [name], MD;  Location: [facility] INVASIVE CV LAB;  Service: Cardiovascular;  Laterality: N/A;   CARPAL TUNNEL RELEASE     bil   CHOLECYSTECTOMY  11/24/2011   Procedure: LAPAROSCOPIC CHOLECYSTECTOMY WITH INTRAOPERATIVE CHOLANGIOGRAM;  Surgeon: Clovis Pu. Cornett, MD;  Location: [facility] ORS;  Service: General;  Laterality: N/A;  Laparoscopic Cholecystectomy with Cholangiogram   COLONOSCOPY     ECTOPIC PREGNANCY SURGERY  many yrs ago   HARDWARE REMOVAL Right 07/15/2015   Procedure: RIGHT ANKLE HARDWARE REMOVAL, PLACEMENT OF STIMULAN ANTIBIOTIC BEADS AND PREVENA WOUND VAC.;  Surgeon: [name], MD;  Location: [facility] OR;  Service: Orthopedics;  Laterality: Right;   TOOTH EXTRACTION N/A 12/03/2016   Procedure: DENTAL EXTRACTIONS with Alveoloplasty;  Surgeon: Ocie Doyne, DDS;  Location: [facility] OR;  Service: Oral Surgery;  Laterality: N/A;    Family History: Family History  Problem Relation Age of Onset   Diabetes Mother    Lung cancer Father    Heart attack Father 77       CABG x4   Breast cancer Sister        Survivor     Social History  reports that she has been smoking cigarettes and e-cigarettes. She started smoking about 6 years ago. She has a 20.3 pack-year smoking  history. She has never used smokeless tobacco. She reports that she does not drink alcohol and does not use drugs.  Allergies  Allergen Reactions   Biaxin [Clarithromycin] Shortness Of Breath and Swelling   Lisinopril Swelling and Cough    Lip edema   Sulfa Antibiotics Anaphylaxis, Shortness Of Breath, Swelling and Hypertension   Chlorthalidone Nausea And Vomiting and Other (See Comments)    Clammy, Tachycardia, Headache    Daptomycin Nausea And Vomiting   Latex Hives and Rash   Amoxicillin-Pot Clavulanate Diarrhea    Has  patient had a PCN reaction causing immediate rash, facial/tongue/throat swelling, SOB or lightheadedness with hypotension:No Has patient had a PCN reaction causing severe rash involving mucus membranes or skin necrosis:No Has patient had a PCN reaction that required hospitalization:No Has patient had a PCN reaction occurring within THE LAST 10 YEARS.  #  #  #  YES  #  #  #  If all of the above answers are "NO", then may proceed with Cephalosporin use.    Aspirin Nausea And Vomiting and Rash    On 325mg  dosage   Cholestyramine Nausea And Vomiting   Dilaudid [Hydromorphone Hcl] Nausea And Vomiting   Lasix [Furosemide] Nausea And Vomiting and Other (See Comments)    Headache     Medications  No current facility-administered medications for this encounter.  Current Outpatient Medications:    albuterol (VENTOLIN HFA) 108 (90 Base) MCG/ACT inhaler, Inhale 2 puffs into the lungs every 4 (four) hours as needed for wheezing or shortness of breath., Disp: 1 each, Rfl: 0   amitriptyline (ELAVIL) 50 MG tablet, Take 1 tablet (50 mg total) by mouth at bedtime., Disp: 90 tablet, Rfl: 3   benzonatate (TESSALON) 100 MG capsule, Take 1 capsule (100 mg total) by mouth 2 (two) times daily as needed for cough., Disp: 20 capsule, Rfl: 0   cetirizine (ZYRTEC) 10 MG tablet, TAKE 1 TABLET(10 MG) BY MOUTH DAILY, Disp: 90 tablet, Rfl: 2   dicyclomine (BENTYL) 10 MG capsule, TAKE ONE CAPSULE BY MOUTH THREE TIMES DAILY BEFORE MEALS (Patient taking differently: Take 10 mg by mouth 2 (two) times daily before a meal.), Disp: 270 capsule, Rfl: 0   diphenoxylate-atropine (LOMOTIL) 2.5-0.025 MG tablet, TAKE ONE TABLET BY MOUTH TWICE DAILY, Disp: 60 tablet, Rfl: 3   doxycycline (VIBRAMYCIN) 100 MG capsule, Take 1 capsule (100 mg total) by mouth 2 (two) times daily., Disp: 20 capsule, Rfl: 0   fenofibrate (TRICOR) 145 MG tablet, TAKE ONE TABLET BY MOUTH EVERY DAY, Disp: 90 tablet, Rfl: 0   FLUoxetine (PROZAC) 40 MG capsule,  TAKE ONE CAPSULE BY MOUTH AT BEDTIME, Disp: 90 capsule, Rfl: 1   hydrOXYzine (ATARAX) 10 MG tablet, Take 1 tablet (10 mg total) by mouth 3 (three) times daily as needed. (Patient taking differently: Take 10 mg by mouth in the morning and at bedtime.), Disp: 270 tablet, Rfl: 1   LANTUS SOLOSTAR 100 UNIT/ML Solostar Pen, INJECT 24 UNITS UNDER THE SKIN DAILY (Patient taking differently: Inject 24 Units into the skin at bedtime.), Disp: 30 mL, Rfl: 2   meclizine (ANTIVERT) 25 MG tablet, Take 1 tablet (25 mg total) by mouth 3 (three) times daily as needed for dizziness. (Patient taking differently: Take 25 mg by mouth 2 (two) times daily.), Disp: 90 tablet, Rfl: 1   metoprolol succinate (TOPROL-XL) 25 MG 24 hr tablet, Take 25 mg by mouth daily., Disp: , Rfl:    mupirocin ointment (BACTROBAN) 2 %, Apply 1 Application topically  2 (two) times daily. (Patient taking differently: Apply 1 Application topically daily as needed (for folliculitis).), Disp: 22 g, Rfl: 1   NOVOLOG FLEXPEN 100 UNIT/ML FlexPen, Max Daily 50 units per correction scale (Patient taking differently: Inject 10-30 Units into the skin 3 (three) times daily before meals.), Disp: 45 mL, Rfl: 2   omeprazole (PRILOSEC) 40 MG capsule, Take 1 capsule (40 mg total) by mouth daily., Disp: 90 capsule, Rfl: 1   pregabalin (LYRICA) 150 MG capsule, TAKE ONE CAPSULE BY MOUTH TWICE DAILY, Disp: 60 capsule, Rfl: 5   promethazine (PHENERGAN) 25 MG tablet, 1/2-1 tab every 4 to 6 hours prn nausea (Patient taking differently: Take 12.5-25 mg by mouth See admin instructions. Take 1/2-1 tablet by mouth every 4 to 6 hours as needed nausea), Disp: 20 tablet, Rfl: 0   rosuvastatin (CRESTOR) 10 MG tablet, TAKE ONE TABLET BY MOUTH EVERY DAY, Disp: 90 tablet, Rfl: 1   Tiotropium Bromide Monohydrate (SPIRIVA RESPIMAT) 1.25 MCG/ACT AERS, Inhale 2 puffs into the lungs daily., Disp: 4 g, Rfl: 5   ACCU-CHEK GUIDE test strip, USE AS DIRECTED IN THE MORNING, AT NOON, IN THE  EVENING AND AT BEDTIME, Disp: 400 strip, Rfl: 3   Accu-Chek Softclix Lancets lancets, Use to check blood sugar THREE TIMES DAILY, Disp: 100 each, Rfl: 2   BD PEN NEEDLE NANO 2ND GEN 32G X 4 MM MISC, USE 1 PEN NEEDLE UNDER THE SKIN FOUR TIMES DAILY, Disp: 400 each, Rfl: 2   Blood Glucose Monitoring Suppl (ACCU-CHEK GUIDE ME) w/Device KIT, Use to check blood sugar TID., Disp: 1 kit, Rfl: 0   Continuous Glucose Sensor (DEXCOM G6 SENSOR) MISC, USE 1 DEVICE AS DIRECTED, Disp: 9 each, Rfl: 4   Continuous Glucose Transmitter (DEXCOM G6 TRANSMITTER) MISC, USE AS DIRECTED, Disp: 1 each, Rfl: 3   Insulin Disposable Pump (OMNIPOD 5 G6 PODS, GEN 5,) MISC, USE 1 DEVICE EVERY 3 DAYS (Patient not taking: Reported on 04/13/2023), Disp: 30 each, Rfl: 3   Lancet Devices (ACCU-CHEK SOFTCLIX) lancets, Use as instructed daily., Disp: 1 each, Rfl: 5   predniSONE (DELTASONE) 20 MG tablet, Take 2 tablets (40 mg total) by mouth daily. (Patient not taking: Reported on 04/13/2023), Disp: 10 tablet, Rfl: 0  Facility-Administered Medications Ordered in Other Encounters:    regadenoson (LEXISCAN) injection SOLN 0.4 mg, 0.4 mg, Intravenous, Once, Tobias Alexander H, MD   technetium tetrofosmin (TC-MYOVIEW) injection 32.8 millicurie, 32.8 millicurie, Intravenous, Once PRN, Lars Masson, MD  Vitals   Vitals:   04/13/23 0430 04/13/23 0445 04/13/23 0500 04/13/23 0515  BP: 136/81 (!) 143/81 (!) 145/85   Pulse: 71 74 73   Resp: 14 14 17    Temp:    98.9 F (37.2 C)  TempSrc:      SpO2: 94% 93% 97%   Weight:      Height:        Body mass index is 23.49 kg/m.  Physical Exam   Constitutional: Appears well-developed and well-nourished.   Neurologic Examination   Neuro: Mental Status: Patient is awake, alert, oriented to person, place, month, year, and situation. Patient is able to give a clear and coherent history. No signs of aphasia or neglect Cranial Nerves: II: Visual Fields are full. Pupils are equal,  round, and reactive to light.   III,IV, VI: EOMI without ptosis or diploplia.  V: Facial sensation is symmetric to temperature VII: Facial movement is symmetric.  VIII: hearing is intact to voice X: Uvula elevates symmetrically XII: tongue  is midline without atrophy or fasciculations.  Motor: Tone is normal. Bulk is normal. 5/5 strength was present in all four extremities.  Sensory: Sensation is symmetric to light touch and temperature in the arms and legs. Deep Tendon Reflexes: 2+ and symmetric in the biceps and patellae.  Cerebellar: FNF intact bilaterally        Labs   CBC:  Recent Labs  Lab 04/13/23 0006  WBC 11.8*  NEUTROABS 7.3  HGB 14.5  HCT 44.8  MCV 84.1  PLT 183    Basic Metabolic Panel:  Lab Results  Component Value Date   NA 135 04/13/2023   K 3.8 04/13/2023   CO2 25 04/13/2023   GLUCOSE 220 (H) 04/13/2023   BUN 12 04/13/2023   CREATININE 0.61 04/13/2023   CALCIUM 8.7 (L) 04/13/2023   GFRNONAA >60 04/13/2023   GFRAA 114 07/11/2020   Lipid Panel:  Lab Results  Component Value Date   LDLCALC 79 03/21/2023   HgbA1c:  Lab Results  Component Value Date   HGBA1C 8.4 (H) 06/03/2022   Urine Drug Screen:     Component Value Date/Time   LABOPIA NONE DETECTED 04/13/2023 0133   COCAINSCRNUR NONE DETECTED 04/13/2023 0133   LABBENZ NONE DETECTED 04/13/2023 0133   AMPHETMU NONE DETECTED 04/13/2023 0133   THCU NONE DETECTED 04/13/2023 0133   LABBARB NONE DETECTED 04/13/2023 0133    Alcohol Level     Component Value Date/Time   ETH <10 04/13/2023 0006   INR  Lab Results  Component Value Date   INR 1.0 04/13/2023   APTT  Lab Results  Component Value Date   APTT 27 04/13/2023   AED levels: No results found for: "PHENYTOIN", "ZONISAMIDE", "LAMOTRIGINE", "LEVETIRACETA"   CT Head without contrast(Personally reviewed): CT head is negative Impression   Aniqua Briere is a 62 y.o. female with transient facial numbness and arm  numbness.  Her description of the facial numbness as bilateral is unusual, but I do wonder how accurate this is.  With her extensive risk factors, I think that treating this as TIA would be the prudent course of action, and I would favor admission for secondary risk factor modification with a ABCD2 of four.  Recommendations  - HgbA1c, fasting lipid panel - MRI of the brain without contrast - Frequent neuro checks - Echocardiogram - CTA head and neck - Prophylactic therapy-Antiplatelet med: Aspirin - dose 81mg  and plavix 75mg  daily  after 300mg  load  - Risk factor modification - Telemetry monitoring - PT consult, OT consult, Speech consult - Stroke team to follow  ______________________________________________________________________    Stormy Card

## 2023-04-13 NOTE — Consult Note (Signed)
CARDIOLOGY CONSULT NOTE       Patient ID: [name] MRN: [id number] DOB/AGE: 61-Nov-1962 61 y.o.  Admit date: 04/12/2023 Referring Physician: Willia Craze Primary Physician: Hoy Register, MD Primary Cardiologist: Nahser Reason for Consultation: Abnormal echo / TIA  Principal Problem:   TIA (transient ischemic attack) Active Problems:   Essential hypertension   GERD (gastroesophageal reflux disease)   COPD (chronic obstructive pulmonary disease) (HCC)   Insulin dependent type 2 diabetes mellitus (HCC)   Hyperlipidemia   Chronic heart failure with preserved ejection fraction (HFpEF) (HCC)   HPI:  62 y.o. with history of smoking/COPD, HTN, DM DCM with EF as low as 20-25% on TTE done 2016 Cath 2016 no obstructive CAD and non ischemic myovue 2019.  Her EF has improved on f/u echos. She has chronic LBBB. TTE 04/13/23 EF 45-50% abnormal septal motion from BBB. She has no significant valve dx and normal atrial sizes.   Presented with right facial/arm numbness Now resolved. MRI with  Small acute infarcts in the right cerebellum, right parietal cortex, and left postcentral gyrus. 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 She has not felt well since flu shot 2 weeks ago with headache, malaise diarrhea, and nausea.   PMH notes arrhythmia ? PAF/flutter but I see no documentation of this  ECG;s in epic all NSR. No mention by Dr Elease Hashimoto or other cardiology notes that this has been documented. Patient is unaware of afib and notes she has had minor palpitations / slow HR;s in past   ROS All other systems reviewed and negative except as noted above  Past Medical History:  Diagnosis Date   Anxiety    Arthritis    Chronic  systolic heart failure (HCC)    EF 20-25% ECHO 05/2015   COPD (chronic obstructive pulmonary disease) (HCC)    Depression    Diabetes mellitus without complication (HCC)    Type II   Dysrhythmia    pt unsure of name of arrythmia - " my heart rate will drop all of a sudden" -no current treatment - atrial flutter   Fibrillation, atrial (HCC)    Gallstones    GERD (gastroesophageal reflux disease)    Headache(784.0)    migraines   Hypertension    Osteomyelitis (HCC)    R ankle   Rash    arms    Family History  Problem Relation Age of Onset   Diabetes Mother    Lung cancer Father    Heart attack Father 70       CABG x4   Breast cancer Sister        Survivor     Social History   Socioeconomic History   Marital status: Divorced    Spouse name: Not on file   Number of children: Not on file   Years of education: Not on file   Highest education level: Not on file  Occupational History   Not on file  Tobacco Use   Smoking status: Every Day    Current packs/day: 1.00    Average packs/day: 1 pack/day for 20.3 years (20.3 ttl pk-yrs)    Types: Cigarettes, E-cigarettes    Start date: 09/19/2016   Smokeless tobacco: Never   Tobacco comments:  Pt stated smoking 12 cigarettes daily  updated 03/10/2023 amy marsh, cma  Vaping Use   Vaping status: Every Day   Start date: 10/12/2016  Substance and Sexual Activity   Alcohol use: No    Alcohol/week: 0.0 standard drinks of alcohol   Drug use: No   Sexual activity: Not Currently    Birth control/protection: Post-menopausal  Other Topics Concern   Not on file  Social History Narrative   ** Merged History Encounter **       Social Determinants of Health   Financial Resource Strain: Not on file  Food Insecurity: Patient Unable To Answer (04/13/2023)   Hunger Vital Sign    Worried About Running Out of Food in the Last Year: Patient unable to answer    Ran Out of Food in the Last Year: Patient unable to answer  Transportation  Needs: Patient Unable To Answer (04/13/2023)   PRAPARE - Transportation    Lack of Transportation (Medical): Patient unable to answer    Lack of Transportation (Non-Medical): Patient unable to answer  Physical Activity: Not on file  Stress: Not on file  Social Connections: Not on file  Intimate Partner Violence: Patient Unable To Answer (04/13/2023)   Humiliation, Afraid, Rape, and Kick questionnaire    Fear of Current or Ex-Partner: Patient unable to answer    Emotionally Abused: Patient unable to answer    Physically Abused: Patient unable to answer    Sexually Abused: Patient unable to answer    Past Surgical History:  Procedure Laterality Date   ANKLE FRACTURE SURGERY Right 2015   CARDIAC CATHETERIZATION N/A 06/24/2015   Procedure: Left Heart Cath and Coronary Angiography;  Surgeon: [name], MD;  Location: [facility] INVASIVE CV LAB;  Service: Cardiovascular;  Laterality: N/A;   CARPAL TUNNEL RELEASE     bil   CHOLECYSTECTOMY  11/24/2011   Procedure: LAPAROSCOPIC CHOLECYSTECTOMY WITH INTRAOPERATIVE CHOLANGIOGRAM;  Surgeon: Clovis Pu. Cornett, MD;  Location: [facility] ORS;  Service: General;  Laterality: N/A;  Laparoscopic Cholecystectomy with Cholangiogram   COLONOSCOPY     ECTOPIC PREGNANCY SURGERY  many yrs ago   HARDWARE REMOVAL Right 07/15/2015   Procedure: RIGHT ANKLE HARDWARE REMOVAL, PLACEMENT OF STIMULAN ANTIBIOTIC BEADS AND PREVENA WOUND VAC.;  Surgeon: [name], MD;  Location: [facility] OR;  Service: Orthopedics;  Laterality: Right;   TOOTH EXTRACTION N/A 12/03/2016   Procedure: DENTAL EXTRACTIONS with Alveoloplasty;  Surgeon: Ocie Doyne, DDS;  Location: [facility] OR;  Service: Oral Surgery;  Laterality: N/A;      Current Facility-Administered Medications:    [START ON 04/14/2023]  stroke: early stages of recovery book, , Does not apply, Once, John Giovanni, MD   acetaminophen (TYLENOL) tablet 650 mg, 650 mg, Oral, Q6H PRN **OR** acetaminophen (TYLENOL) 160 MG/5ML solution 650  mg, 650 mg, Per Tube, Q6H PRN **OR** acetaminophen (TYLENOL) suppository 650 mg, 650 mg, Rectal, Q6H PRN, John Giovanni, MD   albuterol (PROVENTIL) (2.5 MG/3ML) 0.083% nebulizer solution 2.5 mg, 2.5 mg, Inhalation, Q4H PRN, John Giovanni, MD   amitriptyline (ELAVIL) tablet 50 mg, 50 mg, Oral, QHS, Rathore, Ulyess Blossom, MD   benzonatate (TESSALON) capsule 100 mg, 100 mg, Oral, BID PRN, John Giovanni, MD   [START ON 04/14/2023] clopidogrel (PLAVIX) tablet 75 mg, 75 mg, Oral, Daily, Rathore, Vasundhra, MD   dicyclomine (BENTYL) capsule 10 mg, 10 mg, Oral, BID AC, John Giovanni, MD, 10 mg at 04/13/23 0754   enoxaparin (LOVENOX) injection 40 mg, 40 mg, Subcutaneous, Q24H, John Giovanni, MD, 40 mg  at 04/13/23 1034   fenofibrate tablet 160 mg, 160 mg, Oral, Daily, John Giovanni, MD, 160 mg at 04/13/23 1034   FLUoxetine (PROZAC) capsule 40 mg, 40 mg, Oral, QHS, John Giovanni, MD   hydrOXYzine (ATARAX) tablet 10 mg, 10 mg, Oral, BID PRN, John Giovanni, MD   insulin aspart (novoLOG) injection 0-5 Units, 0-5 Units, Subcutaneous, QHS, Rathore, Vasundhra, MD   insulin aspart (novoLOG) injection 0-9 Units, 0-9 Units, Subcutaneous, TID WC, John Giovanni, MD   insulin glargine-yfgn (SEMGLEE) injection 24 Units, 24 Units, Subcutaneous, QHS, Rathore, Ulyess Blossom, MD   nicotine (NICODERM CQ - dosed in mg/24 hours) patch 14 mg, 14 mg, Transdermal, Daily, John Giovanni, MD, 14 mg at 04/13/23 1034   pantoprazole (PROTONIX) EC tablet 40 mg, 40 mg, Oral, Daily, John Giovanni, MD, 40 mg at 04/13/23 1034   pregabalin (LYRICA) capsule 150 mg, 150 mg, Oral, BID, John Giovanni, MD, 150 mg at 04/13/23 1033   rosuvastatin (CRESTOR) tablet 10 mg, 10 mg, Oral, Daily, John Giovanni, MD, 10 mg at 04/13/23 1034   Tiotropium Bromide Monohydrate AERS 2 puff, 2 puff, Inhalation, Daily, John Giovanni, MD  Current Outpatient Medications:    albuterol (VENTOLIN HFA) 108  (90 Base) MCG/ACT inhaler, Inhale 2 puffs into the lungs every 4 (four) hours as needed for wheezing or shortness of breath., Disp: 1 each, Rfl: 0   amitriptyline (ELAVIL) 50 MG tablet, Take 1 tablet (50 mg total) by mouth at bedtime., Disp: 90 tablet, Rfl: 3   benzonatate (TESSALON) 100 MG capsule, Take 1 capsule (100 mg total) by mouth 2 (two) times daily as needed for cough., Disp: 20 capsule, Rfl: 0   cetirizine (ZYRTEC) 10 MG tablet, TAKE 1 TABLET(10 MG) BY MOUTH DAILY, Disp: 90 tablet, Rfl: 2   dicyclomine (BENTYL) 10 MG capsule, TAKE ONE CAPSULE BY MOUTH THREE TIMES DAILY BEFORE MEALS (Patient taking differently: Take 10 mg by mouth 2 (two) times daily before a meal.), Disp: 270 capsule, Rfl: 0   diphenoxylate-atropine (LOMOTIL) 2.5-0.025 MG tablet, TAKE ONE TABLET BY MOUTH TWICE DAILY, Disp: 60 tablet, Rfl: 3   doxycycline (VIBRAMYCIN) 100 MG capsule, Take 1 capsule (100 mg total) by mouth 2 (two) times daily., Disp: 20 capsule, Rfl: 0   fenofibrate (TRICOR) 145 MG tablet, TAKE ONE TABLET BY MOUTH EVERY DAY, Disp: 90 tablet, Rfl: 0   FLUoxetine (PROZAC) 40 MG capsule, TAKE ONE CAPSULE BY MOUTH AT BEDTIME, Disp: 90 capsule, Rfl: 1   hydrOXYzine (ATARAX) 10 MG tablet, Take 1 tablet (10 mg total) by mouth 3 (three) times daily as needed. (Patient taking differently: Take 10 mg by mouth in the morning and at bedtime.), Disp: 270 tablet, Rfl: 1   LANTUS SOLOSTAR 100 UNIT/ML Solostar Pen, INJECT 24 UNITS UNDER THE SKIN DAILY (Patient taking differently: Inject 24 Units into the skin at bedtime.), Disp: 30 mL, Rfl: 2   meclizine (ANTIVERT) 25 MG tablet, Take 1 tablet (25 mg total) by mouth 3 (three) times daily as needed for dizziness. (Patient taking differently: Take 25 mg by mouth 2 (two) times daily.), Disp: 90 tablet, Rfl: 1   metoprolol succinate (TOPROL-XL) 25 MG 24 hr tablet, Take 25 mg by mouth daily., Disp: , Rfl:    mupirocin ointment (BACTROBAN) 2 %, Apply 1 Application topically 2 (two)  times daily. (Patient taking differently: Apply 1 Application topically daily as needed (for folliculitis).), Disp: 22 g, Rfl: 1   NOVOLOG FLEXPEN 100 UNIT/ML FlexPen, Max Daily 50 units per correction scale (Patient taking  differently: Inject 10-30 Units into the skin 3 (three) times daily before meals.), Disp: 45 mL, Rfl: 2   omeprazole (PRILOSEC) 40 MG capsule, Take 1 capsule (40 mg total) by mouth daily., Disp: 90 capsule, Rfl: 1   pregabalin (LYRICA) 150 MG capsule, TAKE ONE CAPSULE BY MOUTH TWICE DAILY, Disp: 60 capsule, Rfl: 5   promethazine (PHENERGAN) 25 MG tablet, 1/2-1 tab every 4 to 6 hours prn nausea (Patient taking differently: Take 12.5-25 mg by mouth See admin instructions. Take 1/2-1 tablet by mouth every 4 to 6 hours as needed nausea), Disp: 20 tablet, Rfl: 0   rosuvastatin (CRESTOR) 10 MG tablet, TAKE ONE TABLET BY MOUTH EVERY DAY, Disp: 90 tablet, Rfl: 1   Tiotropium Bromide Monohydrate (SPIRIVA RESPIMAT) 1.25 MCG/ACT AERS, Inhale 2 puffs into the lungs daily., Disp: 4 g, Rfl: 5   ACCU-CHEK GUIDE test strip, USE AS DIRECTED IN THE MORNING, AT NOON, IN THE EVENING AND AT BEDTIME, Disp: 400 strip, Rfl: 3   Accu-Chek Softclix Lancets lancets, Use to check blood sugar THREE TIMES DAILY, Disp: 100 each, Rfl: 2   BD PEN NEEDLE NANO 2ND GEN 32G X 4 MM MISC, USE 1 PEN NEEDLE UNDER THE SKIN FOUR TIMES DAILY, Disp: 400 each, Rfl: 2   Blood Glucose Monitoring Suppl (ACCU-CHEK GUIDE ME) w/Device KIT, Use to check blood sugar TID., Disp: 1 kit, Rfl: 0   Continuous Glucose Sensor (DEXCOM G6 SENSOR) MISC, USE 1 DEVICE AS DIRECTED, Disp: 9 each, Rfl: 4   Continuous Glucose Transmitter (DEXCOM G6 TRANSMITTER) MISC, USE AS DIRECTED, Disp: 1 each, Rfl: 3   Insulin Disposable Pump (OMNIPOD 5 G6 PODS, GEN 5,) MISC, USE 1 DEVICE EVERY 3 DAYS (Patient not taking: Reported on 04/13/2023), Disp: 30 each, Rfl: 3   Lancet Devices (ACCU-CHEK SOFTCLIX) lancets, Use as instructed daily., Disp: 1 each, Rfl: 5    predniSONE (DELTASONE) 20 MG tablet, Take 2 tablets (40 mg total) by mouth daily. (Patient not taking: Reported on 04/13/2023), Disp: 10 tablet, Rfl: 0  Facility-Administered Medications Ordered in Other Encounters:    regadenoson (LEXISCAN) injection SOLN 0.4 mg, 0.4 mg, Intravenous, Once, Delton See, Faustino Congress, MD   technetium tetrofosmin (TC-MYOVIEW) injection 32.8 millicurie, 32.8 millicurie, Intravenous, Once PRN, Delton See, Faustino Congress, MD  [START ON 04/14/2023]  stroke: early stages of recovery book   Does not apply Once   amitriptyline  50 mg Oral QHS   [START ON 04/14/2023] clopidogrel  75 mg Oral Daily   dicyclomine  10 mg Oral BID AC   enoxaparin (LOVENOX) injection  40 mg Subcutaneous Q24H   fenofibrate  160 mg Oral Daily   FLUoxetine  40 mg Oral QHS   insulin aspart  0-5 Units Subcutaneous QHS   insulin aspart  0-9 Units Subcutaneous TID WC   insulin glargine-yfgn  24 Units Subcutaneous QHS   nicotine  14 mg Transdermal Daily   pantoprazole  40 mg Oral Daily   pregabalin  150 mg Oral BID   rosuvastatin  10 mg Oral Daily   Tiotropium Bromide Monohydrate  2 puff Inhalation Daily     Physical Exam: Blood pressure 134/85, pulse 79, temperature 98.2 F (36.8 C), temperature source Oral, resp. rate 18, height 5\' 7"  (1.702 m), weight 68 kg, last menstrual period 06/09/2011, SpO2 94%.    Elderly female  COPD no active wheezing No murmur No carotid bruits Abdomen benign No edema Neuro now non focal  Labs:   Lab Results  Component Value Date  WBC 8.4 04/13/2023   HGB 14.4 04/13/2023   HCT 43.7 04/13/2023   MCV 84.9 04/13/2023   PLT 162 04/13/2023    Recent Labs  Lab 04/13/23 0006  NA 135  K 3.8  CL 102  CO2 25  BUN 12  CREATININE 0.61  CALCIUM 8.7*  PROT 6.5  BILITOT 0.5  ALKPHOS 82  ALT 16  AST 14*  GLUCOSE 220*   Lab Results  Component Value Date   CKTOTAL 35 02/23/2009   CKMB 0.6 02/23/2009   TROPONINI <0.03 08/30/2017    Lab Results  Component  Value Date   CHOL 135 03/21/2023   CHOL 142 06/03/2022   CHOL 114 06/11/2021   Lab Results  Component Value Date   HDL 34 (L) 03/21/2023   HDL 43 06/03/2022   HDL 28 (L) 06/11/2021   Lab Results  Component Value Date   LDLCALC 79 03/21/2023   LDLCALC 82 06/03/2022   LDLCALC 39 06/11/2021   Lab Results  Component Value Date   TRIG 122 03/21/2023   TRIG 87 06/03/2022   TRIG 316 (H) 06/11/2021   Lab Results  Component Value Date   CHOLHDL 5.6 (H) 07/31/2019   CHOLHDL 4.8 (H) 06/23/2018   CHOLHDL 6.7 (H) 05/22/2018   No results found for: "LDLDIRECT"    Radiology: MR BRAIN WO CONTRAST  Result Date: 04/13/2023 CLINICAL DATA:  TIA. Lip and right arm numbness. Inability to speak during the same episode. Symptoms now resolved. EXAM: MRI HEAD WITHOUT CONTRAST TECHNIQUE: Multiplanar, multiecho pulse sequences of the brain and surrounding structures were obtained without intravenous contrast. COMPARISON:  Brain MRI 01/31/2022 FINDINGS: Brain: Small foci of restricted diffusion in the right cerebellum, left postcentral gyrus, and right parietal cortex. On FLAIR imaging there has been prior small cortical infarcts in the left perirolandic region and in the bilateral cerebellum. No acute hemorrhage, hydrocephalus, collection, or masslike finding. Vascular: Normal flow voids. Skull and upper cervical spine: Normal marrow signal. Sinuses/Orbits: Minimal chronic bilateral mastoid opacification with negative nasopharynx. Mild mucosal thickening in the left maxillary sinus. 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 Electronically Signed   By: Tiburcio Pea M.D.   On: 04/13/2023 11:05   ECHOCARDIOGRAM COMPLETE  Result Date: 04/13/2023    ECHOCARDIOGRAM REPORT   Patient Name:   MAKHYA ARAVE Patchen Date  of Exam: 04/13/2023 Medical Rec #:  [id number]           Height:       67.0 in Accession #:    [id number]          Weight:       150.0 lb Date of Birth:  1961/04/07           BSA:          1.790 m Patient Age:    62 years            BP:           145/85 mmHg Patient Gender: F                   HR:           71 bpm. Exam Location:  Inpatient Procedure: 2D Echo, Cardiac Doppler, Color Doppler and Intracardiac            Opacification Agent Indications:    TIA  History:        Patient has prior history of Echocardiogram examinations, most                 recent 09/15/2021. CHF, TIA and COPD, Signs/Symptoms:Chest Pain;                 Risk Factors:Hypertension, Diabetes, Current Smoker and                 Dyslipidemia.  Sonographer:    [name] RCS Referring Phys: [id number] VASUNDHRA RATHORE IMPRESSIONS  1. Septal movement consistent with LBBB. Left ventricular ejection fraction, by estimation, is 45 to 50%. The left ventricle has mildly decreased function. The left ventricle demonstrates global hypokinesis. Indeterminate diastolic filling due to E-A fusion.  2. Right ventricular systolic function is mildly reduced. The right ventricular size is normal. There is normal pulmonary artery systolic pressure. The estimated right ventricular systolic pressure is 12.5 mmHg.  3. The mitral valve is grossly normal. Trivial mitral valve regurgitation. No evidence of mitral stenosis.  4. The aortic valve is tricuspid. There is mild calcification of the aortic valve. Aortic valve regurgitation is trivial. Aortic valve sclerosis is present, with no evidence of aortic valve stenosis.  5. The inferior vena cava is normal in size with greater than 50% respiratory variability, suggesting right atrial pressure of 3 mmHg. Comparison(s): Changes from prior study are noted. The left ventricular function is worsened. FINDINGS  Left Ventricle: Septal movement consistent with LBBB. Left ventricular ejection fraction, by estimation, is 45  to 50%. The left ventricle has mildly decreased function. The left ventricle demonstrates global hypokinesis. Definity contrast agent was given IV to delineate the left ventricular endocardial borders. The left ventricular internal cavity size was normal in size. There is no left ventricular hypertrophy. Abnormal (paradoxical) septal motion, consistent with left bundle branch block. Indeterminate diastolic filling due to E-A fusion. Right Ventricle: The right ventricular size is normal. No increase in right ventricular wall thickness. Right ventricular systolic function is mildly reduced. There is normal pulmonary artery systolic pressure. The tricuspid regurgitant velocity is 1.54 m/s, and with an assumed right atrial pressure of 3 mmHg, the estimated right ventricular systolic pressure is 12.5 mmHg. Left Atrium: Left atrial size was normal in size. Right Atrium: Right atrial size was normal in size. Pericardium: Trivial pericardial effusion is present. Mitral Valve: The mitral valve is grossly normal. Trivial mitral valve regurgitation. No evidence of mitral valve stenosis. Tricuspid Valve: The tricuspid valve is grossly normal. Tricuspid valve regurgitation is trivial. No evidence of tricuspid stenosis. Aortic Valve: The aortic valve is tricuspid. There is mild calcification of the aortic valve. Aortic valve regurgitation is trivial. Aortic valve sclerosis is present, with no evidence of aortic valve stenosis. Aortic valve peak gradient measures 7.0 mmHg. Pulmonic Valve: The pulmonic valve was grossly normal. Pulmonic valve regurgitation is not visualized. No evidence of pulmonic stenosis. Aorta: The aortic root and ascending aorta are structurally normal, with no evidence of dilitation. Venous: The inferior vena cava is normal in size with greater than 50% respiratory variability, suggesting right atrial pressure of 3 mmHg. IAS/Shunts: The atrial septum is grossly normal.  LEFT VENTRICLE PLAX 2D LVIDd:          4.45 cm   Diastology LVIDs:         3.65 cm   LV e' medial:    5.59 cm/s LV PW:         1.10 cm   LV E/e' medial:  9.0 LV IVS:        1.05 cm   LV e' lateral:   8.24 cm/s LVOT diam:     2.10 cm   LV E/e' lateral: 6.1 LV SV:         47 LV SV Index:   26 LVOT Area:     3.46 cm  RIGHT VENTRICLE             IVC RV S prime:     12.30 cm/s  IVC diam: 1.60 cm TAPSE (M-mode): 1.3 cm LEFT ATRIUM             Index        RIGHT ATRIUM           Index LA diam:        2.95 cm 1.65 cm/m   RA Area:     11.70 cm LA Vol (A2C):   33.0 ml 18.44 ml/m  RA Volume:   24.90 ml  13.91 ml/m LA Vol (A4C):   34.4 ml 19.22 ml/m LA Biplane Vol: 36.5 ml 20.40 ml/m  AORTIC VALVE AV Area (Vmax): 2.07 cm AV Vmax:        132.50 cm/s AV Peak Grad:   7.0 mmHg LVOT Vmax:      79.23 cm/s LVOT Vmean:     51.900 cm/s LVOT VTI:       0.135 m  AORTA Ao Root diam: 3.10 cm Ao Asc diam:  3.30 cm MITRAL VALVE               TRICUSPID VALVE MV Area (PHT): 4.39 cm    TR Peak grad:   9.5 mmHg MV Decel Time: 173 msec    TR Vmax:        154.00 cm/s MR Peak grad: 25.2 mmHg MR Vmax:      251.00 cm/s  SHUNTS MV E velocity: 50.40 cm/s  Systemic VTI:  0.14 m MV A velocity: 88.30 cm/s  Systemic Diam: 2.10 cm MV E/A ratio:  0.57 Lennie Odor MD Electronically signed by Lennie Odor MD Signature Date/Time: 04/13/2023/10:36:38 AM    Final    CT Head Wo Contrast  Result Date: 04/13/2023 CLINICAL DATA:  Headache and facial numbness. EXAM: CT HEAD WITHOUT CONTRAST TECHNIQUE: Contiguous axial images were obtained from the base of the skull through the vertex without intravenous contrast. RADIATION DOSE REDUCTION: This exam was performed according to the departmental dose-optimization program which includes automated exposure control, adjustment of the mA and/or kV according to patient size and/or use of iterative reconstruction technique. COMPARISON:  None Available. FINDINGS: Brain: No evidence of acute infarction, hemorrhage, hydrocephalus, extra-axial collection  or mass lesion/mass effect. Vascular: There is mild bilateral cavernous carotid artery calcification. Skull: Normal. Negative for fracture or focal lesion. Sinuses/Orbits: Mild left maxillary sinus mucosal thickening is seen. Other: None. IMPRESSION: 1. No acute intracranial abnormality. 2. Mild left maxillary sinus disease. Electronically Signed   By: Aram Candela M.D.   On: 04/13/2023 02:05   DG Chest 2 View  Result Date: 04/05/2023 CLINICAL DATA:  eval for pneumonia EXAM: CHEST - 2 VIEW COMPARISON:  Chest x-ray 11/08/2022 FINDINGS: The heart and mediastinal contours are unchanged. Aortic calcification. No focal consolidation. No pulmonary edema. No pleural effusion. No pneumothorax. No acute osseous abnormality. IMPRESSION: 1. No active cardiopulmonary disease. 2.  Aortic Atherosclerosis (ICD10-I70.0). Electronically Signed   By: Tish Frederickson M.D.   On: 04/05/2023 03:22    EKG: SR LBBB no acute changes  ASSESSMENT AND PLAN:   TIA: documented on MRI. Symptoms resolved. Risk factors for atherosclerosis include HTN, DM and smoking. She has not had any arrhythmia/afib this admission. Favor 30 day monitor then possible ILR as outpatient to check for PAF. Don't think TEE would add much. Continue plavix per neuro. Will arrange outpatient f/u with Dr Elease Hashimoto and monitor  CTA neck pending to assess carotid arteries  DCM:  not much different from prior TTE and has been much worse She has allergy to ACE with angioedema so no ARB/ARNI She is not volume overloaded on exam No CAD on prior cath and normal myovue 2019. Continue Toprol Can consider adding bidil in future  HLD:  last LDL 79 with high triglycerides from DM. Start statin Crestor 10 mg per primary service  DM  poorly controlled A1c 11.4 She has poor vision needing corneal transplants but also suspect diabetic issues. Needs better insulin regimen Start Farxiga 10 mg  Smoking:  counseled on cessation < 10 minutes  CXR 04/05/23  NAD  Signed: Charlton Haws 04/13/2023, 12:34 PM

## 2023-04-13 NOTE — Telephone Encounter (Addendum)
Patient admitted for TIA, plan for 30 day monitor per Dr. Eden Emms. Dr. Elease Hashimoto and Perlie Gold CC to results. Follow up has been arranged 2 weeks after monitor time.

## 2023-04-13 NOTE — Progress Notes (Addendum)
STROKE TEAM PROGRESS NOTE   BRIEF HPI [name] is a 62 y.o. female with history of HLD, essential hypertension, type 2 diabetes mellitus, tobacco use, anxiety, depression, chronic systolic heart failure, GERD, GIB in 2017 and osteomyelitis presenting with transient numbness to her whole face and right arm lasting approximately 30 to minutes before resolution. Patient reports having intermittent palpitations over the past few years with an increase in frequency recently each lasting approximately 3 to 4 minutes.  Patient reports having cardiac palpitations prior to onset of presenting symptoms.  SIGNIFICANT HOSPITAL EVENTS 10/15:  - CT head without acute intracranial abnormality. - Admitted for TIA workup 10/16: - MRI brain with small acute infarcts in the right cerebellum, right parietal cortex, and left precentral gyrus concerning for cardioembolic etiology.  Remote small vessel infarcts seen along the cerebral cortex and cerebellum.  - Cardiology consulted, recommending 30 day event monitor, consider ILR following for PAF.   INTERIM HISTORY/SUBJECTIVE Patient remains in the ER on assessment this morning, no family is at bedside. Patient reports resolution of symptoms without further sensory deficits or complaints of palpitations since hospital arrival. Cardiology consulted 10/16 recommending 30-day cardiac monitor then possible ILR outpatient to check PAF.  Patient will be followed outpatient with cardiology.  OBJECTIVE  CBC    Component Value Date/Time   WBC 11.8 (H) 04/13/2023 0006   RBC 5.33 (H) 04/13/2023 0006   HGB 14.5 04/13/2023 0006   HGB 14.9 11/25/2021 1645   HCT 44.8 04/13/2023 0006   HCT 45.1 11/25/2021 1645   PLT 183 04/13/2023 0006   PLT 190 11/25/2021 1645   MCV 84.1 04/13/2023 0006   MCV 88 11/25/2021 1645   MCH 27.2 04/13/2023 0006   MCHC 32.4 04/13/2023 0006   RDW 13.3 04/13/2023 0006   RDW 12.5 11/25/2021 1645   LYMPHSABS 3.5 04/13/2023 0006    LYMPHSABS 3.0 11/25/2021 1645   MONOABS 0.5 04/13/2023 0006   EOSABS 0.3 04/13/2023 0006   EOSABS 0.3 11/25/2021 1645   BASOSABS 0.1 04/13/2023 0006   BASOSABS 0.1 11/25/2021 1645   BMET    Component Value Date/Time   NA 135 04/13/2023 0006   NA 136 03/21/2023 0848   K 3.8 04/13/2023 0006   CL 102 04/13/2023 0006   CO2 25 04/13/2023 0006   GLUCOSE 220 (H) 04/13/2023 0006   BUN 12 04/13/2023 0006   BUN 10 03/21/2023 0848   CREATININE 0.61 04/13/2023 0006   CREATININE 0.59 07/05/2016 0947   CALCIUM 8.7 (L) 04/13/2023 0006   EGFR 100 03/21/2023 0848   GFRNONAA >60 04/13/2023 0006   GFRNONAA >89 07/05/2016 0947   Lab Results  Component Value Date   CHOL 135 03/21/2023   HDL 34 (L) 03/21/2023   LDLCALC 79 03/21/2023   TRIG 122 03/21/2023   CHOLHDL 5.6 (H) 07/31/2019   Lab Results  Component Value Date   HGBA1C 8.4 (H) 06/03/2022   IMAGING past 24 hours CT Head Wo Contrast  Result Date: 04/13/2023 CLINICAL DATA:  Headache and facial numbness. EXAM: CT HEAD WITHOUT CONTRAST TECHNIQUE: Contiguous axial images were obtained from the base of the skull through the vertex without intravenous contrast. RADIATION DOSE REDUCTION: This exam was performed according to the departmental dose-optimization program which includes automated exposure control, adjustment of the mA and/or kV according to patient size and/or use of iterative reconstruction technique. COMPARISON:  None Available. FINDINGS: Brain: No evidence of acute infarction, hemorrhage, hydrocephalus, extra-axial collection or mass lesion/mass effect. Vascular: There is  mild bilateral cavernous carotid artery calcification. Skull: Normal. Negative for fracture or focal lesion. Sinuses/Orbits: Mild left maxillary sinus mucosal thickening is seen. Other: None. IMPRESSION: 1. No acute intracranial abnormality. 2. Mild left maxillary sinus disease. Electronically Signed   By: Aram Candela M.D.   On: 04/13/2023 02:05    Vitals:    04/13/23 0430 04/13/23 0445 04/13/23 0500 04/13/23 0515  BP: 136/81 (!) 143/81 (!) 145/85   Pulse: 71 74 73   Resp: 14 14 17    Temp:    98.9 F (37.2 C)  TempSrc:      SpO2: 94% 93% 97%   Weight:      Height:       PHYSICAL EXAM General:  Alert, well-nourished, well-developed Caucasian female patient in no acute distress Psych:  Mood and affect appropriate for situation, calm and cooperative with examination CV: Regular rate and rhythm on monitor Respiratory:  Regular, unlabored respirations on room air GI: Abdomen soft and nontender  NEURO:  Mental Status: AA&Ox3, patient is able to give clear and coherent history Speech/Language: speech is without aphasia, mildly dysarthric due to poor dentition, baseline speech.  Naming, repetition, fluency, and comprehension intact.  Cranial Nerves:  II: PERRL. Visual fields full.  III, IV, VI: EOMI. Eyelids elevate symmetrically.  V: Sensation is intact to light touch and symmetrical to face.  VII: Face is symmetrical resting and and with movement VIII: Hearing is intact to voice. IX, X: Palate elevates symmetrically. Phonation is normal.  XI: Shoulder shrug 5/5. XII: Tongue is midline without fasciculations. Motor: 5/5 strength to all muscle groups tested.  Tone: is normal and bulk is normal Sensation: Intact to light touch bilaterally. Extinction absent to light touch to DSS.   Coordination: FTN intact bilaterally, HKS: no ataxia in BLE. No drift.  Gait: Deferred  ASSESSMENT/PLAN  Acute Ischemic Infarct: Small acute infarcts in the right cerebellum, right parietal cortex, and left postcentral gyrus, etiology: Likely cardioembolic in nature  CT head without contrast 10/16: No acute intracranial abnormality CTA head & neck unremarkable MRI small acute infarcts in the right cerebellum, right parietal cortex, and left precentral gyrus.  Remote small vessel infarcts seen along the cerebral cortex and cerebellum, question risk factors for  recurrent embolic disease. 2D Echo LVEF 45-50%, left ventricular global hypokinesis. LDL 79 HgbA1c 11.4 UDS negative VTE prophylaxis - lovenox SQ No antithrombotic prior to admission, now on clopidogrel 75 mg daily due to aspirin intolerance.  Therapy recommendations: none Disposition:  pending   Questionable history of Atrial fibrillation Documentation reports possible history of atrial fibrillation without supporting evidence or EKGs for review Reported episode of Afib in 2010 Patient is uncertain of AF diagnosis in the past Reports palpitations of increasing frequency recently Continue telemetry monitoring Cardiology consulted, requesting 30 day event monitor followed by possible ILR  CHF Follow with cardiology EF 20 to 30% in 05/2015 EF normalized in 09/2021 This admission EF 45 to 50% Cardiology on board, recommend to continue Toprol, and consider adding BiDil in the future  Hypertension Home meds: Metoprolol Stable Long term blood pressure goal normotension    Hyperlipidemia Home meds:  rosuvastatin 10 mg  LDL 79, goal < 70 Increase rosuvastatin to 20 mg PO daily Continue statin at discharge  Diabetes type II Uncontrolled Home meds:  Lantus. novolog HgbA1c 11.4, goal < 7.0 CBGs SSI Start Farxiga 10 mg per cardiology Recommend close follow-up with PCP for better DM control  Tobacco Abuse Patient smokes tobacco daily Tobacco cessation counseling  provided Nicotine replacement therapy provided  Other Stroke Risk Factors Hx of Stroke/TIA on imaging - Remote small vessel infarcts along the cerebral cortex and cerebellum seen on MRI brain 04/13/23  Other Active Problems COPD Postherpetic neuralgia Chronic headache following with Dr. Teresa Coombs at [facility] day # 0  Lanae Boast, AGACNP-BC [facility] Pager: [phone]  ATTENDING NOTE: I reviewed above note and agree with the assessment and plan. Pt was seen and examined.   No family at  bedside.  Patient lying bed, awake alert orientated x 3, neuro exam intact.  No focal deficit.  Patient stated that she had episodic heart palpitation over the years, lasting 3 to 4 minutes each.  Recently more frequent than before.  She had a questionable diagnosis of afib but no clear documentation.  She also had history of CHF with EF 20 to 30% in 2016 but normalized in 2023.  Current EF 45 to 50%.  Cardiology on board, recommend continue metoprolol and may add BiDil in the future.  Recommend 30-day CardioNet monitoring followed by loop recorder for A-fib evaluation.  Continue Plavix and increase Crestor from 10 to 20.  Patient will continue follow-up with Dr. Teresa Coombs at [facility].  For detailed assessment and plan, please refer to above/below as I have made changes wherever appropriate.   Neurology will sign off. Please call with questions. Pt will follow up with Dr. Teresa Coombs at [facility] on 04/17/2023. Thanks for the consult.   Marvel Plan, MD PhD Stroke Neurology 04/13/2023 6:44 PM  I spent additional 30 inpatient face-to-face time with the patient, more than 50% of which was spent in counseling and coordination of care, reviewing test results, images and medication, and discussing the diagnosis, treatment plan and potential prognosis. This patient's care requiresreview of multiple databases, neurological assessment, discussion with family, other specialists and medical decision making of high complexity.      To contact Stroke Continuity provider, please refer to [email or web address]. After hours, contact General Neurology

## 2023-04-13 NOTE — Evaluation (Signed)
Physical Therapy Brief Evaluation and Discharge Note Patient Details Name: [name] MRN: [id number] DOB: 12/13/1960 Today's Date: 04/13/2023   History of Present Illness  Pt is 62 year old presented [facility] on  04/12/23 for transient facial numbness and RUE numbness that resolved. 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 PMH - HTN, chf, coopd, DM  Clinical Impression  Pt very close to baseline for mobility. She feels a little woozy due to taking xanax prior to MRI. No further PT needed.      PT Assessment Patient does not need any further PT services  Assistance Needed at Discharge  PRN    Equipment Recommendations None recommended by PT  Recommendations for Other Services       Precautions/Restrictions Precautions Precautions: None        Mobility  Bed Mobility Rolling: Independent Supine/Sidelying to sit: Independent Sit to supine/sidelying: Independent    Transfers Overall transfer level: Modified independent Equipment used: None                    Ambulation/Gait Ambulation/Gait assistance: Modified independent (Device/Increase time) Gait Distance (Feet): 200 Feet Assistive device: None Gait Pattern/deviations: Drifts right/left Gait Speed: Pace WFL General Gait Details: Steady gait with one episode of drifting which pt feels is due to the xanax she took prior to MRI. Able to do head turns and vertical up/down without loss of balance.  Home Activity Instructions    Stairs            Modified Rankin (Stroke Patients Only)        Balance Overall balance assessment: Mild deficits observed, not formally tested           Standing balance-Leahy Scale: Good            Pertinent Vitals/Pain   Pain Assessment Pain Assessment: No/denies pain     Home Living Family/patient expects to be discharged to:: Private residence Living Arrangements:  Children Available Help at Discharge: Family;Available PRN/intermittently Home Environment: Level entry   Home Equipment: Cane - single Librarian, academic (2 wheels)        Prior Function Level of Independence: Independent      UE/LE Assessment        LE ROM/Strength/Tone/Coordination: [facility]      Communication   Communication Communication: No apparent difficulties     Cognition Overall Cognitive Status: Appears within functional limits for tasks assessed/performed       General Comments      Exercises     Assessment/Plan    PT Problem List         PT Visit Diagnosis Other symptoms and signs involving the nervous system (R29.898)    No Skilled PT Patient is modified independent with all activity/mobility   Co-evaluation                AMPAC 6 Clicks Help needed turning from your back to your side while in a flat bed without using bedrails?: None Help needed moving from lying on your back to sitting on the side of a flat bed without using bedrails?: None Help needed moving to and from a bed to a chair (including a wheelchair)?: None Help needed standing up from a chair using your arms (e.g., wheelchair or bedside chair)?: None Help needed to walk in hospital room?: None Help needed climbing 3-5 steps with a railing? : None 6 Click Score: 24  End of Session   Activity Tolerance: Patient tolerated treatment well Patient left: in bed;with call bell/phone within reach   PT Visit Diagnosis: Other symptoms and signs involving the nervous system (Z61.096)     Time: 0454-0981 PT Time Calculation (min) (ACUTE ONLY): 10 min  Charges:   PT Evaluation $PT Eval Low Complexity: 1 Low      [facility] PT Acute Rehabilitation Services Office [phone]   [facility]  04/13/2023, 12:21 PM

## 2023-04-13 NOTE — Progress Notes (Signed)
Brief same day note:  Patient is a 62 year old female with history of insulin-dependent diabetes type 2, hypertension, diastolic CHF, anxiety, depression, GERD, cervical radiculopathy, neuropathy, COPD who presented with an episode of transient facial and right arm numbness.  On presentation she was mildly hypertensive.  Patient was admitted for the management of suspected stroke/TIA.  MRI pending.  Echo showed diminished EF of 45-50 %, cardiology consulted.  Patient seen and examined at bedside today.  Hemodynamically stable.  She does not have any focal deficits or numbness during my evaluation.  Speech was clear.  Alert and oriented   Assessment and plan:  TIA: Presented with an episode of right-sided numbness of arm and face.  CT head did not show any acute findings.  She was loaded with Plavix.  MRI, echo, CTA head and neck pending. PT/OT evaluation pending.  A1c of 11.4.  Lipid panel pending  Systolic CHF: Currently appears euvolemic.  Echo done here as part of the stroke workup showed EF of 45 to 50%, global hypokinesis.  No atrial sound.  Cardiology consulted.  Previous echo showed normal EF  Uncontrolled insulin-dependent diabetes type II: Last A1c of 11.4.  Continue current insulin regimen.  Monitor blood sugars.  Will consult diabetic coordinator  Hypertension: Continue monitoring, allow permissive hypertension  Hyperlipidemia: On fenofibrate, Crestor  Anxiety/depression: Continue home medication  GERD: Continue Protonix  COPD: No signs of exacerbation.  Continue home inhalers  Chronic pain/neuropathy: Continue Lyrica  Smoking: Nicotine patch ordered

## 2023-04-13 NOTE — H&P (Signed)
History and Physical    [name] ONG:[id number] DOB: 04/23/1961 DOA: 04/12/2023  PCP: Hoy Register, MD  Patient coming from: Home  Chief Complaint: Facial and arm numbness  HPI: [name] is a 62 y.o. female with medical history significant of insulin-dependent type 2 diabetes, hypertension, HFpEF, anxiety, depression, GERD, IBS with diarrhea, cervical radiculopathy, left shoulder adhesive capsulitis, neuropathy, COPD presented to the ED after an episode of transient facial and right arm numbness.  Symptoms had resolved prior to ED arrival.  Vital signs stable.  Labs notable for WBC 11.8, glucose 220, blood ethanol level undetectable, UDS negative.  CT head showing no acute intracranial abnormality.  Neurology consulted due to concern for TIA and [facility] called to admit.  Patient states tonight around 9:30 PM she was sitting watching TV, made herself a cup of coffee and as she was about to sip the coffee, she realized that she could not feel her lips.  She was having numbness of her entire face.  Also her right arm was numb from the elbow down.  She could still move her arm.  No lower extremity weakness or numbness.  States her daughter was present with her at that time and she was able to speak to her.  Symptoms completely resolved in about 30 minutes.  She denies history of previous stroke.  Reports chronic cough related to cigarette smoking.  Smokes 1/2 pack/day and has been doing so since the age of 6.  She is motivated to quit smoking now since she just had strokelike symptoms.  She reports history of flulike illness a week ago and symptoms have resolved.  Denies fevers, cough, shortness of breath, chest pain, vomiting, diarrhea, or abdominal pain.  Review of Systems:  Review of Systems  All other systems reviewed and are negative.   Past Medical History:  Diagnosis Date   Anxiety    Arthritis    Chronic systolic heart failure (HCC)    EF 20-25% ECHO 05/2015   COPD  (chronic obstructive pulmonary disease) (HCC)    Depression    Diabetes mellitus without complication (HCC)    Type II   Dysrhythmia    pt unsure of name of arrythmia - " my heart rate will drop all of a sudden" -no current treatment - atrial flutter   Fibrillation, atrial (HCC)    Gallstones    GERD (gastroesophageal reflux disease)    Headache(784.0)    migraines   Hypertension    Osteomyelitis (HCC)    R ankle   Rash    arms    Past Surgical History:  Procedure Laterality Date   ANKLE FRACTURE SURGERY Right 2015   CARDIAC CATHETERIZATION N/A 06/24/2015   Procedure: Left Heart Cath and Coronary Angiography;  Surgeon: [name], MD;  Location: [facility] INVASIVE CV LAB;  Service: Cardiovascular;  Laterality: N/A;   CARPAL TUNNEL RELEASE     bil   CHOLECYSTECTOMY  11/24/2011   Procedure: LAPAROSCOPIC CHOLECYSTECTOMY WITH INTRAOPERATIVE CHOLANGIOGRAM;  Surgeon: Clovis Pu. Cornett, MD;  Location: [facility] ORS;  Service: General;  Laterality: N/A;  Laparoscopic Cholecystectomy with Cholangiogram   COLONOSCOPY     ECTOPIC PREGNANCY SURGERY  many yrs ago   HARDWARE REMOVAL Right 07/15/2015   Procedure: RIGHT ANKLE HARDWARE REMOVAL, PLACEMENT OF STIMULAN ANTIBIOTIC BEADS AND PREVENA WOUND VAC.;  Surgeon: [name], MD;  Location: [facility] OR;  Service: Orthopedics;  Laterality: Right;   TOOTH EXTRACTION N/A 12/03/2016   Procedure: DENTAL EXTRACTIONS with Alveoloplasty;  Surgeon: Ocie Doyne, DDS;  Location: [facility] OR;  Service: Oral Surgery;  Laterality: N/A;     reports that she has been smoking cigarettes and e-cigarettes. She started smoking about 6 years ago. She has a 20.3 pack-year smoking history. She has never used smokeless tobacco. She reports that she does not drink alcohol and does not use drugs.  Allergies  Allergen Reactions   Biaxin [Clarithromycin] Shortness Of Breath and Swelling   Lisinopril Swelling and Cough    Lip edema   Sulfa Antibiotics Anaphylaxis, Shortness Of Breath,  Swelling and Hypertension   Chlorthalidone Nausea And Vomiting and Other (See Comments)    Clammy, Tachycardia, Headache    Daptomycin Nausea And Vomiting   Latex Hives and Rash   Amoxicillin-Pot Clavulanate Diarrhea    Has patient had a PCN reaction causing immediate rash, facial/tongue/throat swelling, SOB or lightheadedness with hypotension:No Has patient had a PCN reaction causing severe rash involving mucus membranes or skin necrosis:No Has patient had a PCN reaction that required hospitalization:No Has patient had a PCN reaction occurring within THE LAST 10 YEARS.  #  #  #  YES  #  #  #  If all of the above answers are "NO", then may proceed with Cephalosporin use.    Aspirin Nausea And Vomiting and Rash    On 325mg  dosage   Cholestyramine Nausea And Vomiting   Dilaudid [Hydromorphone Hcl] Nausea And Vomiting   Lasix [Furosemide] Nausea And Vomiting and Other (See Comments)    Headache     Family History  Problem Relation Age of Onset   Diabetes Mother    Lung cancer Father    Heart attack Father 30       CABG x4   Breast cancer Sister        Survivor     Prior to Admission medications   Medication Sig Start Date End Date Taking? Authorizing Provider  albuterol (VENTOLIN HFA) 108 (90 Base) MCG/ACT inhaler Inhale 2 puffs into the lungs every 4 (four) hours as needed for wheezing or shortness of breath. 04/05/23  Yes Roxy Horseman, [name]  amitriptyline (ELAVIL) 50 MG tablet Take 1 tablet (50 mg total) by mouth at bedtime. 08/23/22 08/18/23 Yes Windell Norfolk, MD  benzonatate (TESSALON) 100 MG capsule Take 1 capsule (100 mg total) by mouth 2 (two) times daily as needed for cough. 04/05/23  Yes Roxy Horseman, [name]  cetirizine (ZYRTEC) 10 MG tablet TAKE 1 TABLET(10 MG) BY MOUTH DAILY 08/10/22  Yes Newlin, Enobong, MD  dicyclomine (BENTYL) 10 MG capsule TAKE ONE CAPSULE BY MOUTH THREE TIMES DAILY BEFORE MEALS Patient taking differently: Take 10 mg by mouth 2 (two) times daily  before a meal. 03/16/23  Yes Newlin, Enobong, MD  diphenoxylate-atropine (LOMOTIL) 2.5-0.025 MG tablet TAKE ONE TABLET BY MOUTH TWICE DAILY 03/17/23  Yes Hoy Register, MD  doxycycline (VIBRAMYCIN) 100 MG capsule Take 1 capsule (100 mg total) by mouth 2 (two) times daily. 04/05/23  Yes Roxy Horseman, [name]  fenofibrate (TRICOR) 145 MG tablet TAKE ONE TABLET BY MOUTH EVERY DAY 04/12/23  Yes Newlin, Enobong, MD  FLUoxetine (PROZAC) 40 MG capsule TAKE ONE CAPSULE BY MOUTH AT BEDTIME 03/18/23  Yes Newlin, Enobong, MD  hydrOXYzine (ATARAX) 10 MG tablet Take 1 tablet (10 mg total) by mouth 3 (three) times daily as needed. Patient taking differently: Take 10 mg by mouth in the morning and at bedtime. 03/16/23  Yes Hoy Register, MD  LANTUS SOLOSTAR 100 UNIT/ML Solostar Pen INJECT 24 UNITS  UNDER THE SKIN DAILY Patient taking differently: Inject 24 Units into the skin at bedtime. 12/27/22  Yes Shamleffer, Konrad Dolores, MD  meclizine (ANTIVERT) 25 MG tablet Take 1 tablet (25 mg total) by mouth 3 (three) times daily as needed for dizziness. Patient taking differently: Take 25 mg by mouth 2 (two) times daily. 03/16/23  Yes Hoy Register, MD  metoprolol succinate (TOPROL-XL) 25 MG 24 hr tablet Take 25 mg by mouth daily. 12/03/21  Yes [provider]  mupirocin ointment (BACTROBAN) 2 % Apply 1 Application topically 2 (two) times daily. Patient taking differently: Apply 1 Application topically daily as needed (for folliculitis). 06/03/22  Yes Newlin, Enobong, MD  NOVOLOG FLEXPEN 100 UNIT/ML FlexPen Max Daily 50 units per correction scale Patient taking differently: Inject 10-30 Units into the skin 3 (three) times daily before meals. 06/08/22  Yes Shamleffer, Konrad Dolores, MD  omeprazole (PRILOSEC) 40 MG capsule Take 1 capsule (40 mg total) by mouth daily. 03/16/23  Yes Newlin, Odette Horns, MD  pregabalin (LYRICA) 150 MG capsule TAKE ONE CAPSULE BY MOUTH TWICE DAILY 03/14/23  Yes Penumalli, Glenford Bayley, MD   promethazine (PHENERGAN) 25 MG tablet 1/2-1 tab every 4 to 6 hours prn nausea Patient taking differently: Take 12.5-25 mg by mouth See admin instructions. Take 1/2-1 tablet by mouth every 4 to 6 hours as needed nausea 03/08/22  Yes Newlin, Enobong, MD  rosuvastatin (CRESTOR) 10 MG tablet TAKE ONE TABLET BY MOUTH EVERY DAY 03/18/23  Yes Newlin, Odette Horns, MD  Tiotropium Bromide Monohydrate (SPIRIVA RESPIMAT) 1.25 MCG/ACT AERS Inhale 2 puffs into the lungs daily. 01/06/23  Yes Byrum, Les Pou, MD  ACCU-CHEK GUIDE test strip USE AS DIRECTED IN THE MORNING, AT NOON, IN THE EVENING AND AT BEDTIME 08/30/22   Shamleffer, Konrad Dolores, MD  Accu-Chek Softclix Lancets lancets Use to check blood sugar THREE TIMES DAILY 06/09/21   Pollyann Savoy, MD  BD PEN NEEDLE NANO 2ND GEN 32G X 4 MM MISC USE 1 PEN NEEDLE UNDER THE SKIN FOUR TIMES DAILY 12/27/22   Shamleffer, Konrad Dolores, MD  Blood Glucose Monitoring Suppl (ACCU-CHEK GUIDE ME) w/Device KIT Use to check blood sugar TID. 01/30/21   Hoy Register, MD  Continuous Glucose Sensor (DEXCOM G6 SENSOR) MISC USE 1 DEVICE AS DIRECTED 10/22/22   Shamleffer, Konrad Dolores, MD  Continuous Glucose Transmitter (DEXCOM G6 TRANSMITTER) MISC USE AS DIRECTED 11/02/22   Shamleffer, Konrad Dolores, MD  Insulin Disposable Pump (OMNIPOD 5 G6 PODS, GEN 5,) MISC USE 1 DEVICE EVERY 3 DAYS Patient not taking: Reported on 04/13/2023 10/22/22   Shamleffer, Konrad Dolores, MD  Lancet Devices [facility]) lancets Use as instructed daily. 11/24/16   Hoy Register, MD  predniSONE (DELTASONE) 20 MG tablet Take 2 tablets (40 mg total) by mouth daily. Patient not taking: Reported on 04/13/2023 04/05/23   Roxy Horseman, [name]    Physical Exam: Vitals:   04/13/23 0430 04/13/23 0445 04/13/23 0500 04/13/23 0515  BP: 136/81 (!) 143/81 (!) 145/85   Pulse: 71 74 73   Resp: 14 14 17    Temp:    98.9 F (37.2 C)  TempSrc:      SpO2: 94% 93% 97%   Weight:      Height:         Physical Exam Vitals reviewed.  Constitutional:      General: She is not in acute distress. HENT:     Head: Normocephalic and atraumatic.  Eyes:     Extraocular Movements: Extraocular movements intact.  Cardiovascular:  Rate and Rhythm: Normal rate and regular rhythm.     Pulses: Normal pulses.  Pulmonary:     Effort: Pulmonary effort is normal. No respiratory distress.     Breath sounds: Normal breath sounds. No wheezing or rales.  Abdominal:     General: Bowel sounds are normal. There is no distension.     Palpations: Abdomen is soft.     Tenderness: There is no abdominal tenderness. There is no guarding.  Musculoskeletal:     Cervical back: Normal range of motion.     Right lower leg: No edema.     Left lower leg: No edema.  Skin:    General: Skin is warm and dry.  Neurological:     General: No focal deficit present.     Mental Status: She is alert and oriented to person, place, and time.     Cranial Nerves: No cranial nerve deficit.     Sensory: No sensory deficit.     Motor: No weakness.     Labs on Admission: I have personally reviewed following labs and imaging studies  CBC: Recent Labs  Lab 04/13/23 0006  WBC 11.8*  NEUTROABS 7.3  HGB 14.5  HCT 44.8  MCV 84.1  PLT 183   Basic Metabolic Panel: Recent Labs  Lab 04/13/23 0006  NA 135  K 3.8  CL 102  CO2 25  GLUCOSE 220*  BUN 12  CREATININE 0.61  CALCIUM 8.7*   GFR: Estimated Creatinine Clearance: 71.8 mL/min (by C-G formula based on SCr of 0.61 mg/dL). Liver Function Tests: Recent Labs  Lab 04/13/23 0006  AST 14*  ALT 16  ALKPHOS 82  BILITOT 0.5  PROT 6.5  ALBUMIN 3.2*   No results for input(s): "LIPASE", "AMYLASE" in the last 168 hours. No results for input(s): "AMMONIA" in the last 168 hours. Coagulation Profile: Recent Labs  Lab 04/13/23 0006  INR 1.0   Cardiac Enzymes: No results for input(s): "CKTOTAL", "CKMB", "CKMBINDEX", "TROPONINI" in the last 168 hours. BNP  (last 3 results) No results for input(s): "PROBNP" in the last 8760 hours. HbA1C: No results for input(s): "HGBA1C" in the last 72 hours. CBG: No results for input(s): "GLUCAP" in the last 168 hours. Lipid Profile: No results for input(s): "CHOL", "HDL", "LDLCALC", "TRIG", "CHOLHDL", "LDLDIRECT" in the last 72 hours. Thyroid Function Tests: No results for input(s): "TSH", "T4TOTAL", "FREET4", "T3FREE", "THYROIDAB" in the last 72 hours. Anemia Panel: No results for input(s): "VITAMINB12", "FOLATE", "FERRITIN", "TIBC", "IRON", "RETICCTPCT" in the last 72 hours. Urine analysis:    Component Value Date/Time   COLORURINE YELLOW 04/13/2023 0133   APPEARANCEUR CLEAR 04/13/2023 0133   LABSPEC 1.020 04/13/2023 0133   PHURINE 5.0 04/13/2023 0133   GLUCOSEU >=500 (A) 04/13/2023 0133   HGBUR NEGATIVE 04/13/2023 0133   BILIRUBINUR NEGATIVE 04/13/2023 0133   BILIRUBINUR negative 11/25/2021 1649   KETONESUR NEGATIVE 04/13/2023 0133   PROTEINUR NEGATIVE 04/13/2023 0133   UROBILINOGEN 0.2 11/25/2021 1649   UROBILINOGEN 0.2 04/25/2013 2329   NITRITE NEGATIVE 04/13/2023 0133   LEUKOCYTESUR NEGATIVE 04/13/2023 0133    Radiological Exams on Admission: CT Head Wo Contrast  Result Date: 04/13/2023 CLINICAL DATA:  Headache and facial numbness. EXAM: CT HEAD WITHOUT CONTRAST TECHNIQUE: Contiguous axial images were obtained from the base of the skull through the vertex without intravenous contrast. RADIATION DOSE REDUCTION: This exam was performed according to the departmental dose-optimization program which includes automated exposure control, adjustment of the mA and/or kV according to patient size and/or use  of iterative reconstruction technique. COMPARISON:  None Available. FINDINGS: Brain: No evidence of acute infarction, hemorrhage, hydrocephalus, extra-axial collection or mass lesion/mass effect. Vascular: There is mild bilateral cavernous carotid artery calcification. Skull: Normal. Negative for  fracture or focal lesion. Sinuses/Orbits: Mild left maxillary sinus mucosal thickening is seen. Other: None. IMPRESSION: 1. No acute intracranial abnormality. 2. Mild left maxillary sinus disease. Electronically Signed   By: Aram Candela M.D.   On: 04/13/2023 02:05    EKG: Independently reviewed.  Sinus rhythm, LBBB.  No significant change compared to previous tracing.  Assessment and Plan  Transient facial and arm numbness Symptoms have resolved.  CT head negative for acute intracranial abnormality.  Given extensive risk factors, neurology recommending TIA workup. -Appreciate neurology recommendations -Hgb A1c, fasting lipid panel -MRI of the brain without contrast -Frequent neuro checks -Echocardiogram -CTA head and neck -Antiplatelet therapy: Patient has history of aspirin intolerance listed.  Discussed with Dr. Amada Jupiter, recommending treating with Plavix alone, 300 mg load followed by 75 mg daily -Risk factor modification -Telemetry monitoring -PT consult, OT consult, Speech consult -Patient has passed bedside swallow screen, diet ordered  Insulin-dependent type 2 diabetes Glucose 220.  Last A1c 8.4 in December 2023, repeat ordered.  Continue home long-acting insulin.  Sensitive sliding scale insulin ACHS.  Hypertension Allow permissive hypertension until brain MRI is done.  Hyperlipidemia Continue fenofibrate and Crestor.  Chronic HFpEF Last echo done in March 2023 showing EF 60 to 65%, mild aortic regurgitation.  Anxiety and depression Continue home medications.  GERD Continue Protonix.  COPD Stable, no signs of acute exacerbation.  Continue home inhalers.  Chronic pain/neuropathy Continue Lyrica.  Cigarette smoking Patient is motivated to quit smoking.  Nicotine patch ordered.  DVT prophylaxis: Lovenox Code Status: Full Code (discussed with the patient) Level of care: Telemetry bed Admission status: It is my clinical opinion that referral for  OBSERVATION is reasonable and necessary in this patient based on the above information provided. The aforementioned taken together are felt to place the patient at high risk for further clinical deterioration. However, it is anticipated that the patient may be medically stable for discharge from the hospital within 24 to 48 hours.  John Giovanni MD [facility]  If 7PM-7AM, please contact night-coverage [email or web address]  04/13/2023, 6:04 AM

## 2023-04-14 ENCOUNTER — Other Ambulatory Visit (HOSPITAL_COMMUNITY): Payer: Self-pay

## 2023-04-14 ENCOUNTER — Telehealth (HOSPITAL_COMMUNITY): Payer: Self-pay | Admitting: Pharmacy Technician

## 2023-04-14 ENCOUNTER — Other Ambulatory Visit: Payer: Self-pay | Admitting: Cardiology

## 2023-04-14 ENCOUNTER — Other Ambulatory Visit: Payer: Self-pay | Admitting: Family Medicine

## 2023-04-14 DIAGNOSIS — I428 Other cardiomyopathies: Secondary | ICD-10-CM

## 2023-04-14 DIAGNOSIS — I639 Cerebral infarction, unspecified: Secondary | ICD-10-CM | POA: Diagnosis not present

## 2023-04-14 DIAGNOSIS — G459 Transient cerebral ischemic attack, unspecified: Secondary | ICD-10-CM

## 2023-04-14 DIAGNOSIS — I5032 Chronic diastolic (congestive) heart failure: Secondary | ICD-10-CM

## 2023-04-14 DIAGNOSIS — I447 Left bundle-branch block, unspecified: Secondary | ICD-10-CM

## 2023-04-14 DIAGNOSIS — R002 Palpitations: Secondary | ICD-10-CM

## 2023-04-14 LAB — GLUCOSE, CAPILLARY: Glucose-Capillary: 74 mg/dL (ref 70–99)

## 2023-04-14 LAB — LIPID PANEL
Cholesterol: 96 mg/dL (ref 0–200)
HDL: 35 mg/dL — ABNORMAL LOW (ref >40)
LDL Cholesterol: 43 mg/dL (ref 0–99)
Total CHOL/HDL Ratio: 2.7 {ratio}
Triglycerides: 90 mg/dL (ref <150)
VLDL: 18 mg/dL (ref 0–40)

## 2023-04-14 MED ORDER — LANTUS SOLOSTAR 100 UNIT/ML ~~LOC~~ SOPN: 30.0000 [IU] | PEN_INJECTOR | Freq: Every day | SUBCUTANEOUS | Status: DC

## 2023-04-14 MED ORDER — ROSUVASTATIN CALCIUM 20 MG PO TABS
20.0000 mg | ORAL_TABLET | Freq: Every day | ORAL | 1 refills | Status: DC
  Filled 2023-04-14: qty 30, 30d supply, fill #0
  Filled 2023-05-13 – 2023-06-07 (×2): qty 30, 30d supply, fill #1

## 2023-04-14 MED ORDER — ROSUVASTATIN CALCIUM 20 MG PO TABS: 20.0000 mg | ORAL_TABLET | Freq: Every day | ORAL | 1 refills | Status: DC

## 2023-04-14 MED ORDER — CLOPIDOGREL BISULFATE 75 MG PO TABS: 75.0000 mg | ORAL_TABLET | Freq: Every day | ORAL | 1 refills | Status: DC

## 2023-04-14 MED ORDER — DAPAGLIFLOZIN PROPANEDIOL 10 MG PO TABS
10.0000 mg | ORAL_TABLET | Freq: Every day | ORAL | 0 refills | Status: DC
  Filled 2023-04-14: qty 30, 30d supply, fill #0

## 2023-04-14 MED ORDER — CLOPIDOGREL BISULFATE 75 MG PO TABS
75.0000 mg | ORAL_TABLET | Freq: Every day | ORAL | 1 refills | Status: DC
  Filled 2023-04-14: qty 30, 30d supply, fill #0
  Filled 2023-05-13: qty 30, 30d supply, fill #1

## 2023-04-14 NOTE — Discharge Summary (Signed)
Physician Discharge Summary  [name] ONG:[id number] DOB: 04-20-1961 DOA: 04/12/2023  PCP: Hoy Register, MD  Admit date: 04/12/2023 Discharge date: 04/14/2023  Admitted From: (Home) Disposition:  (Home )  Recommendations for Outpatient Follow-up:  Follow up with PCP in 1-2 weeks Please obtain BMP/CBC in one week Cardiology will arrange for outpatient 30 days heart monitor, and possible ILR  after that.   Diet recommendation: Heart Healthy   Brief/Interim Summary:  [name] is a 62 y.o. female with medical history significant of insulin-dependent type 2 diabetes, hypertension, HFpEF, anxiety, depression, GERD, IBS with diarrhea, cervical radiculopathy, left shoulder adhesive capsulitis, neuropathy, COPD presented to the ED  with transient numbness to her whole face and right arm lasting approximately 30 to minutes before resolution. Patient reports having intermittent palpitations over the past few years with an increase in frequency recently each lasting approximately 3 to 4 minutes.  Patient reports having cardiac palpitations prior to onset of presenting symptoms.  Mori significant for acute CVA, admitted for workup, please see discussion below  Acute Ischemic Infarct:  -Small acute infarcts in the right cerebellum, right parietal cortex, and left postcentral gyrus, etiology: Likely cardioembolic in nature  -CT head without contrast 10/16: No acute intracranial abnormality -CTA head & neck unremarkable -MRI small acute infarcts in the right cerebellum, right parietal cortex, and left precentral gyrus.  Remote small vessel infarcts seen along the cerebral cortex and cerebellum, question risk factors for recurrent embolic disease. -2D Echo LVEF 45-50%, left ventricular global hypokinesis. -LDL 79 -HgbA1c 11.4 -With greatly appreciated, recommendation to start Plavix due to intolerance to aspirin.   -Cardiology input greatly appreciated, she has not had any  arrhythmia/afib this admission.  Cardiology favor 30 day monitor then possible ILR as outpatient to check for PAF.   Hypertension Permissive hypertension during hospital stay, she will be discharged on her home dose metoprolol  Diabetes mellitus, type II, insulin-dependent, poorly controlled with hyperglycemia -A1c of 11.4, just home regimen (did decrease Lantus from 24 to 30 units), started [place] as well   Hyperlipidemia Continue fenofibrate and Crestor.  Crestor  dose increased from 10-20, given LDL of 79   Chronic combined systolic/diastolic CHF Last echo done in March 2023 showing EF 60 to 65%, mild aortic regurgitation. -EF 40 to 45% this admission, with global hypokinesis, intolerant to ACE-ARB, continue with beta-blockers, Aldactone can be considered as an outpatient, started on Farxiga, cardiology input greatly appreciated   Anxiety and depression Continue home medications.   GERD Continue Protonix.   COPD Stable, no signs of acute exacerbation.  Continue home inhalers.   Chronic pain/neuropathy Continue Lyrica.   Cigarette smoking Patient is motivated to quit smoking.       Discharge Diagnoses:  Principal Problem:   TIA (transient ischemic attack) Active Problems:   Essential hypertension   GERD (gastroesophageal reflux disease)   COPD (chronic obstructive pulmonary disease) (HCC)   Insulin dependent type 2 diabetes mellitus (HCC)   Poorly controlled diabetes mellitus (HCC)   Hyperlipidemia   Chronic heart failure with preserved ejection fraction (HFpEF) (HCC)   Non-ischemic cardiomyopathy (HCC)    Discharge Instructions  Discharge Instructions     Diet - low sodium heart healthy   Complete by: As directed    Discharge instructions   Complete by: As directed    Follow with Primary MD Hoy Register, MD in 7 days   Get CBC, CMP, checked  by Primary MD next visit.    Activity: As tolerated with  Full fall precautions use walker/cane & assistance  as needed   Disposition Home    Diet: Heart Healthy   On your next visit with your primary care physician please Get Medicines reviewed and adjusted.   Please request your Prim.MD to go over all Hospital Tests and Procedure/Radiological results at the follow up, please get all Hospital records sent to your Prim MD by signing hospital release before you go home.   If you experience worsening of your admission symptoms, develop shortness of breath, life threatening emergency, suicidal or homicidal thoughts you must seek medical attention immediately by calling 911 or calling your MD immediately  if symptoms less severe.  You Must read complete instructions/literature along with all the possible adverse reactions/side effects for all the Medicines you take and that have been prescribed to you. Take any new Medicines after you have completely understood and accpet all the possible adverse reactions/side effects.   Do not drive, operating heavy machinery, perform activities at heights, swimming or participation in water activities or provide baby sitting services if your were admitted for syncope or siezures until you have seen by Primary MD or a Neurologist and advised to do so again.  Do not drive when taking Pain medications.    Do not take more than prescribed Pain, Sleep and Anxiety Medications  Special Instructions: If you have smoked or chewed Tobacco  in the last 2 yrs please stop smoking, stop any regular Alcohol  and or any Recreational drug use.  Wear Seat belts while driving.   Please note  You were cared for by a hospitalist during your hospital stay. If you have any questions about your discharge medications or the care you received while you were in the hospital after you are discharged, you can call the unit and asked to speak with the hospitalist on call if the hospitalist that took care of you is not available. Once you are discharged, your primary care physician will  handle any further medical issues. Please note that NO REFILLS for any discharge medications will be authorized once you are discharged, as it is imperative that you return to your primary care physician (or establish a relationship with a primary care physician if you do not have one) for your aftercare needs so that they can reassess your need for medications and monitor your lab values.   Increase activity slowly   Complete by: As directed       Allergies as of 04/14/2023       Reactions   Biaxin [clarithromycin] Shortness Of Breath, Swelling   Lisinopril Swelling, Cough   Lip edema   Sulfa Antibiotics Anaphylaxis, Shortness Of Breath, Swelling, Hypertension   Chlorthalidone Nausea And Vomiting, Other (See Comments)   Clammy, Tachycardia, Headache   Daptomycin Nausea And Vomiting   Latex Hives, Rash   Amoxicillin-pot Clavulanate Diarrhea   Has patient had a PCN reaction causing immediate rash, facial/tongue/throat swelling, SOB or lightheadedness with hypotension:No Has patient had a PCN reaction causing severe rash involving mucus membranes or skin necrosis:No Has patient had a PCN reaction that required hospitalization:No Has patient had a PCN reaction occurring within THE LAST 10 YEARS.  #  #  #  YES  #  #  #  If all of the above answers are "NO", then may proceed with Cephalosporin use.   Aspirin Nausea And Vomiting, Rash   On 325mg  dosage   Cholestyramine Nausea And Vomiting   Dilaudid [hydromorphone Hcl] Nausea And Vomiting  Lasix [furosemide] Nausea And Vomiting, Other (See Comments)   Headache         Medication List     STOP taking these medications    predniSONE 20 MG tablet Commonly known as: DELTASONE       TAKE these medications    Accu-Chek Guide Me w/Device Kit Use to check blood sugar TID.   Accu-Chek Guide test strip Generic drug: glucose blood USE AS DIRECTED IN THE MORNING, AT NOON, IN THE EVENING AND AT BEDTIME   accu-chek softclix  lancets Use as instructed daily.   Accu-Chek Softclix Lancets lancets Use to check blood sugar THREE TIMES DAILY   albuterol 108 (90 Base) MCG/ACT inhaler Commonly known as: VENTOLIN HFA Inhale 2 puffs into the lungs every 4 (four) hours as needed for wheezing or shortness of breath.   amitriptyline 50 MG tablet Commonly known as: ELAVIL Take 1 tablet (50 mg total) by mouth at bedtime.   BD Pen Needle Nano 2nd Gen 32G X 4 MM Misc Generic drug: Insulin Pen Needle USE 1 PEN NEEDLE UNDER THE SKIN FOUR TIMES DAILY   benzonatate 100 MG capsule Commonly known as: TESSALON Take 1 capsule (100 mg total) by mouth 2 (two) times daily as needed for cough.   cetirizine 10 MG tablet Commonly known as: ZYRTEC TAKE 1 TABLET(10 MG) BY MOUTH DAILY   clopidogrel 75 MG tablet Commonly known as: Plavix Take 1 tablet (75 mg total) by mouth daily.   dapagliflozin propanediol 10 MG Tabs tablet Commonly known as: Farxiga Take 1 tablet (10 mg total) by mouth daily before breakfast.   Dexcom G6 Sensor Misc USE 1 DEVICE AS DIRECTED   Dexcom G6 Transmitter Misc USE AS DIRECTED   dicyclomine 10 MG capsule Commonly known as: BENTYL TAKE ONE CAPSULE BY MOUTH THREE TIMES DAILY BEFORE MEALS What changed:  how much to take how to take this when to take this additional instructions   diphenoxylate-atropine 2.5-0.025 MG tablet Commonly known as: LOMOTIL TAKE ONE TABLET BY MOUTH TWICE DAILY   doxycycline 100 MG capsule Commonly known as: VIBRAMYCIN Take 1 capsule (100 mg total) by mouth 2 (two) times daily.   fenofibrate 145 MG tablet Commonly known as: TRICOR TAKE ONE TABLET BY MOUTH EVERY DAY   FLUoxetine 40 MG capsule Commonly known as: PROZAC TAKE ONE CAPSULE BY MOUTH AT BEDTIME   hydrOXYzine 10 MG tablet Commonly known as: ATARAX Take 1 tablet (10 mg total) by mouth 3 (three) times daily as needed. What changed: when to take this   Lantus SoloStar 100 UNIT/ML Solostar  Pen Generic drug: insulin glargine Inject 30 Units into the skin at bedtime. What changed: See the new instructions.   meclizine 25 MG tablet Commonly known as: ANTIVERT Take 1 tablet (25 mg total) by mouth 3 (three) times daily as needed for dizziness. What changed: when to take this   metoprolol succinate 25 MG 24 hr tablet Commonly known as: TOPROL-XL Take 25 mg by mouth daily.   mupirocin ointment 2 % Commonly known as: BACTROBAN Apply 1 Application topically 2 (two) times daily. What changed:  when to take this reasons to take this   NovoLOG FlexPen 100 UNIT/ML FlexPen Generic drug: insulin aspart Max Daily 50 units per correction scale What changed: See the new instructions.   omeprazole 40 MG capsule Commonly known as: PRILOSEC Take 1 capsule (40 mg total) by mouth daily.   Omnipod 5 G6 Pods (Gen 5) Misc USE 1 DEVICE EVERY 3 DAYS  pregabalin 150 MG capsule Commonly known as: LYRICA TAKE ONE CAPSULE BY MOUTH TWICE DAILY   promethazine 25 MG tablet Commonly known as: PHENERGAN 1/2-1 tab every 4 to 6 hours prn nausea What changed:  how much to take how to take this when to take this additional instructions   rosuvastatin 20 MG tablet Commonly known as: Crestor Take 1 tablet (20 mg total) by mouth daily. What changed:  medication strength how much to take   Spiriva Respimat 1.25 MCG/ACT Aers Generic drug: Tiotropium Bromide Monohydrate Inhale 2 puffs into the lungs daily.        Follow-up Information     Perlie Gold, [name] Follow up.   Specialty: Cardiology Why: Tuesday May 24, 2023 Arrive by 10:40 AMAppt at 10:55 AM (25 min) Contact information: [address], [address] [place] [address] [phone]         Windell Norfolk, MD. Go on 04/21/2023.   Specialty: Neurology Why: Follow-up with Dr. Teresa Coombs as scheduled Contact information: [address] [place] [address] [phone]                Allergies   Allergen Reactions   Biaxin [Clarithromycin] Shortness Of Breath and Swelling   Lisinopril Swelling and Cough    Lip edema   Sulfa Antibiotics Anaphylaxis, Shortness Of Breath, Swelling and Hypertension   Chlorthalidone Nausea And Vomiting and Other (See Comments)    Clammy, Tachycardia, Headache    Daptomycin Nausea And Vomiting   Latex Hives and Rash   Amoxicillin-Pot Clavulanate Diarrhea    Has patient had a PCN reaction causing immediate rash, facial/tongue/throat swelling, SOB or lightheadedness with hypotension:No Has patient had a PCN reaction causing severe rash involving mucus membranes or skin necrosis:No Has patient had a PCN reaction that required hospitalization:No Has patient had a PCN reaction occurring within THE LAST 10 YEARS.  #  #  #  YES  #  #  #  If all of the above answers are "NO", then may proceed with Cephalosporin use.    Aspirin Nausea And Vomiting and Rash    On 325mg  dosage   Cholestyramine Nausea And Vomiting   Dilaudid [Hydromorphone Hcl] Nausea And Vomiting   Lasix [Furosemide] Nausea And Vomiting and Other (See Comments)    Headache     Consultations: Neurology cardiology   Procedures/Studies: CT ANGIO HEAD NECK W WO CM  Result Date: 04/13/2023 CLINICAL DATA:  Transient ischemic attack (TIA).  Numbness. EXAM: CT ANGIOGRAPHY HEAD AND NECK WITH AND WITHOUT CONTRAST TECHNIQUE: Multidetector CT imaging of the head and neck was performed using the standard protocol during bolus administration of intravenous contrast. Multiplanar CT image reconstructions and MIPs were obtained to evaluate the vascular anatomy. Carotid stenosis measurements (when applicable) are obtained utilizing NASCET criteria, using the distal internal carotid diameter as the denominator. RADIATION DOSE REDUCTION: This exam was performed according to the departmental dose-optimization program which includes automated exposure control, adjustment of the mA and/or kV according to  patient size and/or use of iterative reconstruction technique. CONTRAST:  75mL OMNIPAQUE IOHEXOL 350 MG/ML SOLN COMPARISON:  Head CT 04/13/2023 at 0121 hours. MRI brain 04/13/2023. FINDINGS: CT HEAD FINDINGS Brain: No acute hemorrhage. The acute infarcts seen on same day brain MRI are below the threshold of CT. 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 No new loss of gray-white differentiation. No hydrocephalus or extra-axial collection. No mass effect or midline shift. Vascular: No hyperdense vessel or unexpected calcification. Skull: No  calvarial fracture or suspicious bone lesion. Skull base is unremarkable. Sinuses/Orbits: No acute finding. Other: None. Review of the MIP images confirms the above findings CTA NECK FINDINGS Aortic arch: Two-vessel arch configuration with common origin of the right brachiocephalic and left common carotid arteries. Arch vessel origins are patent. Right carotid system: No evidence of dissection, stenosis (50% or greater), or occlusion. Left carotid system: No evidence of dissection, stenosis (50% or greater), or occlusion. Vertebral arteries: Codominant. No evidence of dissection, stenosis (50% or greater), or occlusion. Skeleton: Unremarkable. Other neck: Unremarkable. Upper chest: Unremarkable. Review of the MIP images confirms the above findings CTA HEAD FINDINGS Anterior circulation: Intracranial ICAs are patent without stenosis or aneurysm. The proximal ACAs and MCAs are patent without stenosis or aneurysm. Distal branches are symmetric. Posterior circulation: Diminutive but patent basilar artery with persistent fetal origin of the bilateral PCAs and hypoplastic bilateral P1 segments. The SCAs, AICAs and PICAs are patent proximally. The PCAs are patent proximally without stenosis or aneurysm. Distal branches are symmetric. Venous sinuses: Early phase of contrast. Anatomic variants: As above. Review of the MIP images confirms the above findings IMPRESSION: 1. No  acute intracranial abnormality. The acute infarcts seen on same day brain MRI are below the threshold of CT. 2. No large vessel occlusion, hemodynamically significant stenosis, dissection, or aneurysm in the head or neck. Electronically Signed   By: Orvan Falconer M.D.   On: 04/13/2023 16:24   MR BRAIN WO CONTRAST  Result Date: 04/13/2023 CLINICAL DATA:  TIA. Lip and right arm numbness. Inability to speak during the same episode. Symptoms now resolved. EXAM: MRI HEAD WITHOUT CONTRAST TECHNIQUE: Multiplanar, multiecho pulse sequences of the brain and surrounding structures were obtained without intravenous contrast. COMPARISON:  Brain MRI 01/31/2022 FINDINGS: Brain: Small foci of restricted diffusion in the right cerebellum, left postcentral gyrus, and right parietal cortex. On FLAIR imaging there has been prior small cortical infarcts in the left perirolandic region and in the bilateral cerebellum. No acute hemorrhage, hydrocephalus, collection, or masslike finding. Vascular: Normal flow voids. Skull and upper cervical spine: Normal marrow signal. Sinuses/Orbits: Minimal chronic bilateral mastoid opacification with negative nasopharynx. Mild mucosal thickening in the left maxillary sinus. 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 Electronically Signed   By: Tiburcio Pea M.D.   On: 04/13/2023 11:05   ECHOCARDIOGRAM COMPLETE  Result Date: 04/13/2023    ECHOCARDIOGRAM REPORT   Patient Name:   BREEONNA MONE Ritthaler Date of Exam: 04/13/2023 Medical Rec #:  [id number]           Height:       67.0 in Accession #:    [id number]          Weight:       150.0 lb Date of Birth:  January 08, 1961           BSA:          1.790 m Patient Age:    62 years            BP:           145/85 mmHg Patient Gender: F                   HR:            71 bpm. Exam Location:  Inpatient Procedure: 2D Echo, Cardiac Doppler, Color Doppler  and Intracardiac            Opacification Agent Indications:    TIA  History:        Patient has prior history of Echocardiogram examinations, most                 recent 09/15/2021. CHF, TIA and COPD, Signs/Symptoms:Chest Pain;                 Risk Factors:Hypertension, Diabetes, Current Smoker and                 Dyslipidemia.  Sonographer:    [name] RCS Referring Phys: [id number] VASUNDHRA RATHORE IMPRESSIONS  1. Septal movement consistent with LBBB. Left ventricular ejection fraction, by estimation, is 45 to 50%. The left ventricle has mildly decreased function. The left ventricle demonstrates global hypokinesis. Indeterminate diastolic filling due to E-A fusion.  2. Right ventricular systolic function is mildly reduced. The right ventricular size is normal. There is normal pulmonary artery systolic pressure. The estimated right ventricular systolic pressure is 12.5 mmHg.  3. The mitral valve is grossly normal. Trivial mitral valve regurgitation. No evidence of mitral stenosis.  4. The aortic valve is tricuspid. There is mild calcification of the aortic valve. Aortic valve regurgitation is trivial. Aortic valve sclerosis is present, with no evidence of aortic valve stenosis.  5. The inferior vena cava is normal in size with greater than 50% respiratory variability, suggesting right atrial pressure of 3 mmHg. Comparison(s): Changes from prior study are noted. The left ventricular function is worsened. FINDINGS  Left Ventricle: Septal movement consistent with LBBB. Left ventricular ejection fraction, by estimation, is 45 to 50%. The left ventricle has mildly decreased function. The left ventricle demonstrates global hypokinesis. Definity contrast agent was given IV to delineate the left ventricular endocardial borders. The left ventricular internal cavity size was normal in size. There is no left ventricular  hypertrophy. Abnormal (paradoxical) septal motion, consistent with left bundle branch block. Indeterminate diastolic filling due to E-A fusion. Right Ventricle: The right ventricular size is normal. No increase in right ventricular wall thickness. Right ventricular systolic function is mildly reduced. There is normal pulmonary artery systolic pressure. The tricuspid regurgitant velocity is 1.54 m/s, and with an assumed right atrial pressure of 3 mmHg, the estimated right ventricular systolic pressure is 12.5 mmHg. Left Atrium: Left atrial size was normal in size. Right Atrium: Right atrial size was normal in size. Pericardium: Trivial pericardial effusion is present. Mitral Valve: The mitral valve is grossly normal. Trivial mitral valve regurgitation. No evidence of mitral valve stenosis. Tricuspid Valve: The tricuspid valve is grossly normal. Tricuspid valve regurgitation is trivial. No evidence of tricuspid stenosis. Aortic Valve: The aortic valve is tricuspid. There is mild calcification of the aortic valve. Aortic valve regurgitation is trivial. Aortic valve sclerosis is present, with no evidence of aortic valve stenosis. Aortic valve peak gradient measures 7.0 mmHg. Pulmonic Valve: The pulmonic valve was grossly normal. Pulmonic valve regurgitation is not visualized. No evidence of pulmonic stenosis. Aorta: The aortic root and ascending aorta are structurally normal, with no evidence of dilitation. Venous: The inferior vena cava is normal in size with greater than 50% respiratory variability, suggesting right atrial pressure of 3 mmHg. IAS/Shunts: The atrial septum is grossly normal.  LEFT VENTRICLE PLAX 2D LVIDd:         4.45 cm   Diastology LVIDs:         3.65 cm   LV e' medial:  5.59 cm/s LV PW:         1.10 cm   LV E/e' medial:  9.0 LV IVS:        1.05 cm   LV e' lateral:   8.24 cm/s LVOT diam:     2.10 cm   LV E/e' lateral: 6.1 LV SV:         47 LV SV Index:   26 LVOT Area:     3.46 cm  RIGHT VENTRICLE              IVC RV S prime:     12.30 cm/s  IVC diam: 1.60 cm TAPSE (M-mode): 1.3 cm LEFT ATRIUM             Index        RIGHT ATRIUM           Index LA diam:        2.95 cm 1.65 cm/m   RA Area:     11.70 cm LA Vol (A2C):   33.0 ml 18.44 ml/m  RA Volume:   24.90 ml  13.91 ml/m LA Vol (A4C):   34.4 ml 19.22 ml/m LA Biplane Vol: 36.5 ml 20.40 ml/m  AORTIC VALVE AV Area (Vmax): 2.07 cm AV Vmax:        132.50 cm/s AV Peak Grad:   7.0 mmHg LVOT Vmax:      79.23 cm/s LVOT Vmean:     51.900 cm/s LVOT VTI:       0.135 m  AORTA Ao Root diam: 3.10 cm Ao Asc diam:  3.30 cm MITRAL VALVE               TRICUSPID VALVE MV Area (PHT): 4.39 cm    TR Peak grad:   9.5 mmHg MV Decel Time: 173 msec    TR Vmax:        154.00 cm/s MR Peak grad: 25.2 mmHg MR Vmax:      251.00 cm/s  SHUNTS MV E velocity: 50.40 cm/s  Systemic VTI:  0.14 m MV A velocity: 88.30 cm/s  Systemic Diam: 2.10 cm MV E/A ratio:  0.57 Lennie Odor MD Electronically signed by Lennie Odor MD Signature Date/Time: 04/13/2023/10:36:38 AM    Final    CT Head Wo Contrast  Result Date: 04/13/2023 CLINICAL DATA:  Headache and facial numbness. EXAM: CT HEAD WITHOUT CONTRAST TECHNIQUE: Contiguous axial images were obtained from the base of the skull through the vertex without intravenous contrast. RADIATION DOSE REDUCTION: This exam was performed according to the departmental dose-optimization program which includes automated exposure control, adjustment of the mA and/or kV according to patient size and/or use of iterative reconstruction technique. COMPARISON:  None Available. FINDINGS: Brain: No evidence of acute infarction, hemorrhage, hydrocephalus, extra-axial collection or mass lesion/mass effect. Vascular: There is mild bilateral cavernous carotid artery calcification. Skull: Normal. Negative for fracture or focal lesion. Sinuses/Orbits: Mild left maxillary sinus mucosal thickening is seen. Other: None. IMPRESSION: 1. No acute intracranial abnormality. 2.  Mild left maxillary sinus disease. Electronically Signed   By: Aram Candela M.D.   On: 04/13/2023 02:05   DG Chest 2 View  Result Date: 04/05/2023 CLINICAL DATA:  eval for pneumonia EXAM: CHEST - 2 VIEW COMPARISON:  Chest x-ray 11/08/2022 FINDINGS: The heart and mediastinal contours are unchanged. Aortic calcification. No focal consolidation. No pulmonary edema. No pleural effusion. No pneumothorax. No acute osseous abnormality. IMPRESSION: 1. No active cardiopulmonary disease. 2.  Aortic Atherosclerosis (ICD10-I70.0). Electronically Signed  By: Tish Frederickson M.D.   On: 04/05/2023 03:22      Subjective: No significant events overnight, she denies any complaints today  Discharge Exam: Vitals:   04/14/23 0815 04/14/23 0825  BP: (!) 148/89   Pulse: 93   Resp: 17   Temp: 98.1 F (36.7 C)   SpO2: 92% 93%   Vitals:   04/13/23 1335 04/13/23 1957 04/14/23 0815 04/14/23 0825  BP: 129/71 129/77 (!) 148/89   Pulse: 73 94 93   Resp: 19 18 17    Temp: 98.4 F (36.9 C) 98.2 F (36.8 C) 98.1 F (36.7 C)   TempSrc:  Oral Oral   SpO2: 95%  92% 93%  Weight:      Height:        General: Pt is alert, awake, not in acute distress Cardiovascular: RRR, S1/S2 +, no rubs, no gallops Respiratory: CTA bilaterally, no wheezing, no rhonchi Abdominal: Soft, NT, ND, bowel sounds + Extremities: no edema, no cyanosis    The results of significant diagnostics from this hospitalization (including imaging, microbiology, ancillary and laboratory) are listed below for reference.     Microbiology: Recent Results (from the past 240 hour(s))  SARS Coronavirus 2 by RT PCR (hospital order, performed in [facility] hospital lab) *cepheid single result test* Anterior Nasal Swab     Status: None   Collection Time: 04/04/23 10:27 PM   Specimen: Anterior Nasal Swab  Result Value Ref Range Status   SARS Coronavirus 2 by RT PCR NEGATIVE NEGATIVE Final    Comment: Performed at [facility], [address], [place], [place] [address]     Labs: BNP (last 3 results) No results for input(s): "BNP" in the last 8760 hours. Basic Metabolic Panel: Recent Labs  Lab 04/13/23 0006  NA 135  K 3.8  CL 102  CO2 25  GLUCOSE 220*  BUN 12  CREATININE 0.61  CALCIUM 8.7*   Liver Function Tests: Recent Labs  Lab 04/13/23 0006  AST 14*  ALT 16  ALKPHOS 82  BILITOT 0.5  PROT 6.5  ALBUMIN 3.2*   No results for input(s): "LIPASE", "AMYLASE" in the last 168 hours. No results for input(s): "AMMONIA" in the last 168 hours. CBC: Recent Labs  Lab 04/13/23 0006 04/13/23 0818  WBC 11.8* 8.4  NEUTROABS 7.3  --   HGB 14.5 14.4  HCT 44.8 43.7  MCV 84.1 84.9  PLT 183 162   Cardiac Enzymes: No results for input(s): "CKTOTAL", "CKMB", "CKMBINDEX", "TROPONINI" in the last 168 hours. BNP: Invalid input(s): "POCBNP" CBG: Recent Labs  Lab 04/13/23 0738 04/13/23 1651 04/13/23 2008 04/14/23 0815  GLUCAP 122* 169* 137* 74   D-Dimer No results for input(s): "DDIMER" in the last 72 hours. Hgb A1c Recent Labs    04/13/23 0818  HGBA1C 11.4*   Lipid Profile Recent Labs    04/14/23 0447  CHOL 96  HDL 35*  LDLCALC 43  TRIG 90  CHOLHDL 2.7   Thyroid function studies No results for input(s): "TSH", "T4TOTAL", "T3FREE", "THYROIDAB" in the last 72 hours.  Invalid input(s): "FREET3" Anemia work up No results for input(s): "VITAMINB12", "FOLATE", "FERRITIN", "TIBC", "IRON", "RETICCTPCT" in the last 72 hours. Urinalysis    Component Value Date/Time   COLORURINE YELLOW 04/13/2023 0133   APPEARANCEUR CLEAR 04/13/2023 0133   LABSPEC 1.020 04/13/2023 0133   PHURINE 5.0 04/13/2023 0133   GLUCOSEU >=500 (A) 04/13/2023 0133   HGBUR NEGATIVE 04/13/2023 0133   BILIRUBINUR NEGATIVE 04/13/2023 0133  BILIRUBINUR negative 11/25/2021 1649   KETONESUR NEGATIVE 04/13/2023 0133   PROTEINUR NEGATIVE 04/13/2023 0133   UROBILINOGEN 0.2 11/25/2021 1649   UROBILINOGEN 0.2 04/25/2013 2329    NITRITE NEGATIVE 04/13/2023 0133   LEUKOCYTESUR NEGATIVE 04/13/2023 0133   Sepsis Labs Recent Labs  Lab 04/13/23 0006 04/13/23 0818  WBC 11.8* 8.4   Microbiology Recent Results (from the past 240 hour(s))  SARS Coronavirus 2 by RT PCR (hospital order, performed in [facility] hospital lab) *cepheid single result test* Anterior Nasal Swab     Status: None   Collection Time: 04/04/23 10:27 PM   Specimen: Anterior Nasal Swab  Result Value Ref Range Status   SARS Coronavirus 2 by RT PCR NEGATIVE NEGATIVE Final    Comment: Performed at [facility], [address], [place], [place] [address]     Time coordinating discharge: Over 30 minutes  SIGNED:   Huey Bienenstock, MD  [facility] 04/14/2023, 11:24 AM Pager   If 7PM-7AM, please contact night-coverage [email or web address] Password [email or web address]

## 2023-04-14 NOTE — Progress Notes (Signed)
Cardiologist:  Nahser  Subjective:  Denies SSCP, palpitations or Dyspnea Neuro deficits gone Long discussion about need for better DM control and smoking cessation  Objective:  Vitals:   04/13/23 1045 04/13/23 1335 04/13/23 1957 04/14/23 0815  BP: 134/85 129/71 129/77 (!) 148/89  Pulse: 79 73 94 93  Resp: 18 19 18 17   Temp:  98.4 F (36.9 C) 98.2 F (36.8 C) 98.1 F (36.7 C)  TempSrc:   Oral Oral  SpO2: 94% 95%  92%  Weight:      Height:        Intake/Output from previous day: No intake or output data in the 24 hours ending 04/14/23 0945  Physical Exam:  Affect appropriate Older than stated age COPD HEENT: normal Neck supple with no adenopathy JVP normal no bruits no thyromegaly Lungs clear with no wheezing and good diaphragmatic motion Heart:  S1/S2 SEM murmur, no rub, gallop or click PMI normal Abdomen: benighn, BS positve, no tenderness, no AAA no bruit.  No HSM or HJR Distal pulses intact with no bruits No edema Neuro non-focal Skin warm and dry No muscular weakness   Lab Results: Basic Metabolic Panel: Recent Labs    04/13/23 0006  NA 135  K 3.8  CL 102  CO2 25  GLUCOSE 220*  BUN 12  CREATININE 0.61  CALCIUM 8.7*   Liver Function Tests: Recent Labs    04/13/23 0006  AST 14*  ALT 16  ALKPHOS 82  BILITOT 0.5  PROT 6.5  ALBUMIN 3.2*   No results for input(s): "LIPASE", "AMYLASE" in the last 72 hours. CBC: Recent Labs    04/13/23 0006 04/13/23 0818  WBC 11.8* 8.4  NEUTROABS 7.3  --   HGB 14.5 14.4  HCT 44.8 43.7  MCV 84.1 84.9  PLT 183 162    Hemoglobin A1C: Recent Labs    04/13/23 0818  HGBA1C 11.4*   Fasting Lipid Panel: Recent Labs    04/14/23 0447  CHOL 96  HDL 35*  LDLCALC 43  TRIG 90  CHOLHDL 2.7     Imaging: CT ANGIO HEAD NECK W WO CM  Result Date: 04/13/2023 CLINICAL DATA:  Transient ischemic attack (TIA).  Numbness. EXAM: CT ANGIOGRAPHY HEAD AND NECK WITH AND WITHOUT CONTRAST TECHNIQUE:  Multidetector CT imaging of the head and neck was performed using the standard protocol during bolus administration of intravenous contrast. Multiplanar CT image reconstructions and MIPs were obtained to evaluate the vascular anatomy. Carotid stenosis measurements (when applicable) are obtained utilizing NASCET criteria, using the distal internal carotid diameter as the denominator. RADIATION DOSE REDUCTION: This exam was performed according to the departmental dose-optimization program which includes automated exposure control, adjustment of the mA and/or kV according to patient size and/or use of iterative reconstruction technique. CONTRAST:  75mL OMNIPAQUE IOHEXOL 350 MG/ML SOLN COMPARISON:  Head CT 04/13/2023 at 0121 hours. MRI brain 04/13/2023. FINDINGS: CT HEAD FINDINGS Brain: No acute hemorrhage. The acute infarcts seen on same day brain MRI are below the threshold of CT. 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 No new loss of gray-white differentiation. No hydrocephalus or extra-axial collection. No mass effect or midline shift. Vascular: No hyperdense vessel or unexpected calcification. Skull: No calvarial fracture or suspicious bone lesion. Skull base is unremarkable. Sinuses/Orbits: No acute finding. Other: None. Review of the MIP images confirms the above findings CTA NECK FINDINGS Aortic arch: Two-vessel arch configuration with common origin of the right brachiocephalic and left common carotid arteries. Arch vessel  origins are patent. Right carotid system: No evidence of dissection, stenosis (50% or greater), or occlusion. Left carotid system: No evidence of dissection, stenosis (50% or greater), or occlusion. Vertebral arteries: Codominant. No evidence of dissection, stenosis (50% or greater), or occlusion. Skeleton: Unremarkable. Other neck: Unremarkable. Upper chest: Unremarkable. Review of the MIP images confirms the above findings CTA HEAD FINDINGS Anterior circulation: Intracranial  ICAs are patent without stenosis or aneurysm. The proximal ACAs and MCAs are patent without stenosis or aneurysm. Distal branches are symmetric. Posterior circulation: Diminutive but patent basilar artery with persistent fetal origin of the bilateral PCAs and hypoplastic bilateral P1 segments. The SCAs, AICAs and PICAs are patent proximally. The PCAs are patent proximally without stenosis or aneurysm. Distal branches are symmetric. Venous sinuses: Early phase of contrast. Anatomic variants: As above. Review of the MIP images confirms the above findings IMPRESSION: 1. No acute intracranial abnormality. The acute infarcts seen on same day brain MRI are below the threshold of CT. 2. No large vessel occlusion, hemodynamically significant stenosis, dissection, or aneurysm in the head or neck. Electronically Signed   By: Orvan Falconer M.D.   On: 04/13/2023 16:24   MR BRAIN WO CONTRAST  Result Date: 04/13/2023 CLINICAL DATA:  TIA. Lip and right arm numbness. Inability to speak during the same episode. Symptoms now resolved. EXAM: MRI HEAD WITHOUT CONTRAST TECHNIQUE: Multiplanar, multiecho pulse sequences of the brain and surrounding structures were obtained without intravenous contrast. COMPARISON:  Brain MRI 01/31/2022 FINDINGS: Brain: Small foci of restricted diffusion in the right cerebellum, left postcentral gyrus, and right parietal cortex. On FLAIR imaging there has been prior small cortical infarcts in the left perirolandic region and in the bilateral cerebellum. No acute hemorrhage, hydrocephalus, collection, or masslike finding. Vascular: Normal flow voids. Skull and upper cervical spine: Normal marrow signal. Sinuses/Orbits: Minimal chronic bilateral mastoid opacification with negative nasopharynx. Mild mucosal thickening in the left maxillary sinus. 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 Electronically Signed   By: Tiburcio Pea M.D.   On: 04/13/2023 11:05   ECHOCARDIOGRAM COMPLETE  Result Date: 04/13/2023    ECHOCARDIOGRAM REPORT   Patient Name:   [name] Date of Exam: 04/13/2023 Medical Rec #:  [id number]           Height:       67.0 in Accession #:    [id number]          Weight:       150.0 lb Date of Birth:  1960/08/15           BSA:          1.790 m Patient Age:    62 years            BP:           145/85 mmHg Patient Gender: F                   HR:           71 bpm. Exam Location:  Inpatient Procedure: 2D Echo, Cardiac Doppler, Color Doppler and Intracardiac            Opacification Agent Indications:    TIA  History:        Patient has prior history of Echocardiogram examinations, most  recent 09/15/2021. CHF, TIA and COPD, Signs/Symptoms:Chest Pain;                 Risk Factors:Hypertension, Diabetes, Current Smoker and                 Dyslipidemia.  Sonographer:    [name] RCS Referring Phys: [id number] VASUNDHRA RATHORE IMPRESSIONS  1. Septal movement consistent with LBBB. Left ventricular ejection fraction, by estimation, is 45 to 50%. The left ventricle has mildly decreased function. The left ventricle demonstrates global hypokinesis. Indeterminate diastolic filling due to E-A fusion.  2. Right ventricular systolic function is mildly reduced. The right ventricular size is normal. There is normal pulmonary artery systolic pressure. The estimated right ventricular systolic pressure is 12.5 mmHg.  3. The mitral valve is grossly normal. Trivial mitral valve regurgitation. No evidence of mitral stenosis.  4. The aortic valve is tricuspid. There is mild calcification of the aortic valve. Aortic valve regurgitation is trivial. Aortic valve sclerosis is present, with no evidence of aortic valve stenosis.  5. The inferior vena cava is normal in size with greater than 50%  respiratory variability, suggesting right atrial pressure of 3 mmHg. Comparison(s): Changes from prior study are noted. The left ventricular function is worsened. FINDINGS  Left Ventricle: Septal movement consistent with LBBB. Left ventricular ejection fraction, by estimation, is 45 to 50%. The left ventricle has mildly decreased function. The left ventricle demonstrates global hypokinesis. Definity contrast agent was given IV to delineate the left ventricular endocardial borders. The left ventricular internal cavity size was normal in size. There is no left ventricular hypertrophy. Abnormal (paradoxical) septal motion, consistent with left bundle branch block. Indeterminate diastolic filling due to E-A fusion. Right Ventricle: The right ventricular size is normal. No increase in right ventricular wall thickness. Right ventricular systolic function is mildly reduced. There is normal pulmonary artery systolic pressure. The tricuspid regurgitant velocity is 1.54 m/s, and with an assumed right atrial pressure of 3 mmHg, the estimated right ventricular systolic pressure is 12.5 mmHg. Left Atrium: Left atrial size was normal in size. Right Atrium: Right atrial size was normal in size. Pericardium: Trivial pericardial effusion is present. Mitral Valve: The mitral valve is grossly normal. Trivial mitral valve regurgitation. No evidence of mitral valve stenosis. Tricuspid Valve: The tricuspid valve is grossly normal. Tricuspid valve regurgitation is trivial. No evidence of tricuspid stenosis. Aortic Valve: The aortic valve is tricuspid. There is mild calcification of the aortic valve. Aortic valve regurgitation is trivial. Aortic valve sclerosis is present, with no evidence of aortic valve stenosis. Aortic valve peak gradient measures 7.0 mmHg. Pulmonic Valve: The pulmonic valve was grossly normal. Pulmonic valve regurgitation is not visualized. No evidence of pulmonic stenosis. Aorta: The aortic root and ascending aorta  are structurally normal, with no evidence of dilitation. Venous: The inferior vena cava is normal in size with greater than 50% respiratory variability, suggesting right atrial pressure of 3 mmHg. IAS/Shunts: The atrial septum is grossly normal.  LEFT VENTRICLE PLAX 2D LVIDd:         4.45 cm   Diastology LVIDs:         3.65 cm   LV e' medial:    5.59 cm/s LV PW:         1.10 cm   LV E/e' medial:  9.0 LV IVS:        1.05 cm   LV e' lateral:   8.24 cm/s LVOT diam:     2.10 cm  LV E/e' lateral: 6.1 LV SV:         47 LV SV Index:   26 LVOT Area:     3.46 cm  RIGHT VENTRICLE             IVC RV S prime:     12.30 cm/s  IVC diam: 1.60 cm TAPSE (M-mode): 1.3 cm LEFT ATRIUM             Index        RIGHT ATRIUM           Index LA diam:        2.95 cm 1.65 cm/m   RA Area:     11.70 cm LA Vol (A2C):   33.0 ml 18.44 ml/m  RA Volume:   24.90 ml  13.91 ml/m LA Vol (A4C):   34.4 ml 19.22 ml/m LA Biplane Vol: 36.5 ml 20.40 ml/m  AORTIC VALVE AV Area (Vmax): 2.07 cm AV Vmax:        132.50 cm/s AV Peak Grad:   7.0 mmHg LVOT Vmax:      79.23 cm/s LVOT Vmean:     51.900 cm/s LVOT VTI:       0.135 m  AORTA Ao Root diam: 3.10 cm Ao Asc diam:  3.30 cm MITRAL VALVE               TRICUSPID VALVE MV Area (PHT): 4.39 cm    TR Peak grad:   9.5 mmHg MV Decel Time: 173 msec    TR Vmax:        154.00 cm/s MR Peak grad: 25.2 mmHg MR Vmax:      251.00 cm/s  SHUNTS MV E velocity: 50.40 cm/s  Systemic VTI:  0.14 m MV A velocity: 88.30 cm/s  Systemic Diam: 2.10 cm MV E/A ratio:  0.57 Lennie Odor MD Electronically signed by Lennie Odor MD Signature Date/Time: 04/13/2023/10:36:38 AM    Final    CT Head Wo Contrast  Result Date: 04/13/2023 CLINICAL DATA:  Headache and facial numbness. EXAM: CT HEAD WITHOUT CONTRAST TECHNIQUE: Contiguous axial images were obtained from the base of the skull through the vertex without intravenous contrast. RADIATION DOSE REDUCTION: This exam was performed according to the departmental dose-optimization  program which includes automated exposure control, adjustment of the mA and/or kV according to patient size and/or use of iterative reconstruction technique. COMPARISON:  None Available. FINDINGS: Brain: No evidence of acute infarction, hemorrhage, hydrocephalus, extra-axial collection or mass lesion/mass effect. Vascular: There is mild bilateral cavernous carotid artery calcification. Skull: Normal. Negative for fracture or focal lesion. Sinuses/Orbits: Mild left maxillary sinus mucosal thickening is seen. Other: None. IMPRESSION: 1. No acute intracranial abnormality. 2. Mild left maxillary sinus disease. Electronically Signed   By: Aram Candela M.D.   On: 04/13/2023 02:05    Cardiac Studies:  ECG: SR LBBB no acute changes    Telemetry:  NSR 04/14/2023   Echo: EF 45-50%   Medications:     stroke: early stages of recovery book   Does not apply Once   amitriptyline  50 mg Oral QHS   clopidogrel  75 mg Oral Daily   dapagliflozin propanediol  10 mg Oral Daily   dicyclomine  10 mg Oral BID AC   enoxaparin (LOVENOX) injection  40 mg Subcutaneous Q24H   fenofibrate  160 mg Oral Daily   FLUoxetine  40 mg Oral QHS   insulin aspart  0-5 Units Subcutaneous QHS   insulin aspart  0-9  Units Subcutaneous TID WC   insulin glargine-yfgn  24 Units Subcutaneous QHS   nicotine  14 mg Transdermal Daily   pantoprazole  40 mg Oral Daily   pregabalin  150 mg Oral BID   rosuvastatin  10 mg Oral Daily   umeclidinium bromide  1 puff Inhalation Daily      Assessment/Plan:  TIA: documented on MRI. Symptoms resolved. Risk factors for atherosclerosis include HTN, DM and smoking. She has not had any arrhythmia/afib this admission. Favor 30 day monitor then possible ILR as outpatient to check for PAF. Don't think TEE would add much. Continue plavix per neuro. Will arrange outpatient f/u with Dr Elease Hashimoto and monitor  CTA neck with no carotid dx  DCM:  not much different from prior TTE and has been much worse She  has allergy to ACE with angioedema so no ARB/ARNI She is not volume overloaded on exam No CAD on prior cath and normal myovue 2019. Continue Toprol Can consider adding bidil in future  HLD:  last LDL 79 with high triglycerides from DM. Start statin Crestor 10 mg per primary service  DM  poorly controlled A1c 11.4 She has poor vision needing corneal transplants but also suspect diabetic issues. Needs better insulin regimen Start Farxiga 10 mg  Smoking:  counseled on cessation < 10 minutes  CXR 04/05/23 NAD  Cardiology will sign off   Charlton Haws 04/14/2023, 9:45 AM

## 2023-04-14 NOTE — Plan of Care (Signed)
Pt has rested quietly throughout the night with no distress noted. Alert and oriented. On room air. SR on the monitor. Up to BR to void. Steady gait. No complaints voiced. Slept well.     Problem: Education: Goal: Ability to describe self-care measures that may prevent or decrease complications (Diabetes Survival Skills Education) will improve Outcome: Progressing Goal: Individualized Educational Video(s) Outcome: Progressing   Problem: Coping: Goal: Ability to adjust to condition or change in health will improve Outcome: Progressing   Problem: Fluid Volume: Goal: Ability to maintain a balanced intake and output will improve Outcome: Progressing   Problem: Health Behavior/Discharge Planning: Goal: Ability to identify and utilize available resources and services will improve Outcome: Progressing Goal: Ability to manage health-related needs will improve Outcome: Progressing   Problem: Metabolic: Goal: Ability to maintain appropriate glucose levels will improve Outcome: Progressing   Problem: Nutritional: Goal: Maintenance of adequate nutrition will improve Outcome: Progressing Goal: Progress toward achieving an optimal weight will improve Outcome: Progressing   Problem: Skin Integrity: Goal: Risk for impaired skin integrity will decrease Outcome: Progressing   Problem: Tissue Perfusion: Goal: Adequacy of tissue perfusion will improve Outcome: Progressing   Problem: Education: Goal: Knowledge of disease or condition will improve Outcome: Progressing Goal: Knowledge of secondary prevention will improve (MUST DOCUMENT ALL) Outcome: Progressing Goal: Knowledge of patient specific risk factors will improve Loraine Leriche N/A or DELETE if not current risk factor) Outcome: Progressing   Problem: Ischemic Stroke/TIA Tissue Perfusion: Goal: Complications of ischemic stroke/TIA will be minimized Outcome: Progressing   Problem: Coping: Goal: Will verbalize positive feelings about  self Outcome: Progressing Goal: Will identify appropriate support needs Outcome: Progressing   Problem: Health Behavior/Discharge Planning: Goal: Ability to manage health-related needs will improve Outcome: Progressing Goal: Goals will be collaboratively established with patient/family Outcome: Progressing   Problem: Self-Care: Goal: Ability to participate in self-care as condition permits will improve Outcome: Progressing Goal: Verbalization of feelings and concerns over difficulty with self-care will improve Outcome: Progressing Goal: Ability to communicate needs accurately will improve Outcome: Progressing   Problem: Nutrition: Goal: Risk of aspiration will decrease Outcome: Progressing Goal: Dietary intake will improve Outcome: Progressing   Problem: Education: Goal: Knowledge of General Education information will improve Description: Including pain rating scale, medication(s)/side effects and non-pharmacologic comfort measures Outcome: Progressing   Problem: Health Behavior/Discharge Planning: Goal: Ability to manage health-related needs will improve Outcome: Progressing   Problem: Clinical Measurements: Goal: Ability to maintain clinical measurements within normal limits will improve Outcome: Progressing Goal: Will remain free from infection Outcome: Progressing Goal: Diagnostic test results will improve Outcome: Progressing Goal: Respiratory complications will improve Outcome: Progressing Goal: Cardiovascular complication will be avoided Outcome: Progressing   Problem: Activity: Goal: Risk for activity intolerance will decrease Outcome: Progressing   Problem: Nutrition: Goal: Adequate nutrition will be maintained Outcome: Progressing   Problem: Coping: Goal: Level of anxiety will decrease Outcome: Progressing   Problem: Elimination: Goal: Will not experience complications related to bowel motility Outcome: Progressing Goal: Will not experience  complications related to urinary retention Outcome: Progressing   Problem: Pain Managment: Goal: General experience of comfort will improve Outcome: Progressing   Problem: Safety: Goal: Ability to remain free from injury will improve Outcome: Progressing   Problem: Skin Integrity: Goal: Risk for impaired skin integrity will decrease Outcome: Progressing

## 2023-04-14 NOTE — Evaluation (Signed)
Occupational Therapy Evaluation Patient Details Name: [name] MRN: [id number] DOB: Sep 14, 1960 Today's Date: 04/14/2023   History of Present Illness Pt is 62 year old presented to [facility] on  04/12/23 for transient facial numbness and RUE numbness that resolved. 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 PMH - HTN, chf, coopd, DM   Clinical Impression   Pt presenting close to functional baseline, independent when working with PT so deferred mobility and focused on vision assessment and CVA education. Pt reports being back to baseline with vision and demonstrated capacity to complete ADLs independently,  does have some blurry vision in the R eye RLQ but she suspects this could be baseline due to need for cornea transplant. Used teach back method to educated pt on CVA signs/symptoms.  Pt has no further acute skilled OT needs, no follow-up OT needed.      If plan is discharge home, recommend the following: Other (comment) (n/a)    Functional Status Assessment  Patient has not had a recent decline in their functional status  Equipment Recommendations  None recommended by OT    Recommendations for Other Services       Precautions / Restrictions Precautions Precautions: None       ADL either performed or assessed with clinical judgement   ADL Overall ADL's : Independent                                       General ADL Comments: pt independent during assessment with PT, not functional deficits noted in ADLs. Educated pt on CVA signs/symptoms (BE FAST).     Vision Baseline Vision/History:  (needs cornea transplant) Patient Visual Report: Blurring of vision (reports this is not atypical. States it is likely due to need for cornea transplant) Vision Assessment?: Yes Eye Alignment: Within Functional Limits Ocular Range of Motion: Within Functional Limits Alignment/Gaze Preference: Within  Defined Limits Tracking/Visual Pursuits: Able to track stimulus in all quads without difficulty Visual Fields: Other (comment) (increased blurry vision in R eye RLQ) Depth Perception: Overshoots (with R eye and RUE having L eye covered) Additional Comments: Pt does report some blurry vision that is suspected baseline, affects L eye LLQ and R eye RLQ (more blurry than L) that causes overshooting.     Perception Perception: Within Functional Limits       Praxis Praxis: WFL       Pertinent Vitals/Pain Pain Assessment Pain Assessment: No/denies pain     Extremity/Trunk Assessment Upper Extremity Assessment Upper Extremity Assessment: Right hand dominant;Overall [facility] for tasks assessed   Lower Extremity Assessment Lower Extremity Assessment: Defer to PT evaluation   Cervical / Trunk Assessment Cervical / Trunk Assessment: Normal   Communication Communication Communication: No apparent difficulties   Cognition Arousal: Alert Behavior During Therapy: WFL for tasks assessed/performed Overall Cognitive Status: Within Functional Limits for tasks assessed                                       General Comments  HR in the low 100s at rest    Exercises     Shoulder Instructions      Home Living Family/patient expects to be discharged to:: Private residence Living Arrangements: Children Available Help at Discharge: Family;Available 24 hours/day Type of Home: House  Home Access: Level entry     Home Layout: One level               Home Equipment: Cane - single Librarian, academic (2 wheels)          Prior Functioning/Environment Prior Level of Function : Independent/Modified Independent             Mobility Comments: ind ADLs Comments: ind        OT Problem List: Decreased knowledge of precautions      OT Treatment/Interventions:      OT Goals(Current goals can be found in the care plan section) Acute Rehab OT Goals Patient Stated  Goal: to go home OT Goal Formulation: With patient Time For Goal Achievement: 04/28/23 Potential to Achieve Goals: Good  OT Frequency:      Co-evaluation              AM-PAC OT "6 Clicks" Daily Activity     Outcome Measure Help from another person eating meals?: None Help from another person taking care of personal grooming?: None Help from another person toileting, which includes using toliet, bedpan, or urinal?: None Help from another person bathing (including washing, rinsing, drying)?: None Help from another person to put on and taking off regular upper body clothing?: None Help from another person to put on and taking off regular lower body clothing?: None 6 Click Score: 24   End of Session Nurse Communication: Mobility status  Activity Tolerance: Patient tolerated treatment well Patient left: in bed;with call bell/phone within reach  OT Visit Diagnosis: Other symptoms and signs involving the nervous system (R29.898)                Time: 5409-8119 OT Time Calculation (min): 19 min Charges:  OT General Charges $OT Visit: 1 Visit OT Evaluation $OT Eval Low Complexity: 1 Low  04/14/2023  AB, OTR/L  Acute Rehabilitation Services  Office: [phone]   Tristan Schroeder 04/14/2023, 11:25 AM

## 2023-04-14 NOTE — Telephone Encounter (Signed)
Pharmacy Patient Advocate Encounter   Received notification that prior authorization for Farxiga 10MG  tablets is required/requested.   Insurance verification completed.   The patient is insured through [facility] .   Per test claim: PA required; PA submitted to [facility] via CoverMyMeds Key/confirmation #/EOC [id number] Status is pending

## 2023-04-14 NOTE — TOC Transition Note (Signed)
Transition of Care [facility]) - CM/SW Discharge Note   Patient Details  Name: [name] MRN: [id number] Date of Birth: 06-29-1960  [facility] CM/SW Contact:  Gordy Clement, RN Phone Number: 04/14/2023, 11:06 AM   Clinical Narrative:     Patient to DC to home today  No TOC needs identified           Patient Goals and CMS Choice      Discharge Placement                         Discharge Plan and Services Additional resources added to the After Visit Summary for                                       Social Determinants of Health (SDOH) Interventions SDOH Screenings   Food Insecurity: Patient Unable To Answer (04/13/2023)  Housing: High Risk (04/13/2023)  Transportation Needs: Patient Unable To Answer (04/13/2023)  Utilities: Patient Unable To Answer (04/13/2023)  Depression (PHQ2-9): Medium Risk (03/16/2023)  Tobacco Use: High Risk (04/12/2023)     Readmission Risk Interventions     No data to display

## 2023-04-14 NOTE — Telephone Encounter (Signed)
Pharmacy Patient Advocate Encounter  Received notification from [facility] that Prior Authorization for Farxiga 10MG  tablets  has been APPROVED from 04/14/2023 to 04/13/2024   PA #/Case ID/Reference #:  [id number]

## 2023-04-14 NOTE — Discharge Instructions (Signed)
Follow with Primary MD Hoy Register, MD in 7 days   Get CBC, CMP, checked  by Primary MD next visit.    Activity: As tolerated with Full fall precautions use walker/cane & assistance as needed   Disposition Home    Diet: Heart Healthy   On your next visit with your primary care physician please Get Medicines reviewed and adjusted.   Please request your Prim.MD to go over all Hospital Tests and Procedure/Radiological results at the follow up, please get all Hospital records sent to your Prim MD by signing hospital release before you go home.   If you experience worsening of your admission symptoms, develop shortness of breath, life threatening emergency, suicidal or homicidal thoughts you must seek medical attention immediately by calling 911 or calling your MD immediately  if symptoms less severe.  You Must read complete instructions/literature along with all the possible adverse reactions/side effects for all the Medicines you take and that have been prescribed to you. Take any new Medicines after you have completely understood and accpet all the possible adverse reactions/side effects.   Do not drive, operating heavy machinery, perform activities at heights, swimming or participation in water activities or provide baby sitting services if your were admitted for syncope or siezures until you have seen by Primary MD or a Neurologist and advised to do so again.  Do not drive when taking Pain medications.    Do not take more than prescribed Pain, Sleep and Anxiety Medications  Special Instructions: If you have smoked or chewed Tobacco  in the last 2 yrs please stop smoking, stop any regular Alcohol  and or any Recreational drug use.  Wear Seat belts while driving.   Please note  You were cared for by a hospitalist during your hospital stay. If you have any questions about your discharge medications or the care you received while you were in the hospital after you are discharged,  you can call the unit and asked to speak with the hospitalist on call if the hospitalist that took care of you is not available. Once you are discharged, your primary care physician will handle any further medical issues. Please note that NO REFILLS for any discharge medications will be authorized once you are discharged, as it is imperative that you return to your primary care physician (or establish a relationship with a primary care physician if you do not have one) for your aftercare needs so that they can reassess your need for medications and monitor your lab values.

## 2023-04-14 NOTE — Evaluation (Signed)
Speech Language Pathology Evaluation Patient Details Name: [name] MRN: [id number] DOB: Sep 16, 1960 Today's Date: 04/14/2023 Time: 5784-6962 SLP Time Calculation (min) (ACUTE ONLY): 23 min  Problem List:  Patient Active Problem List   Diagnosis Date Noted   TIA (transient ischemic attack) 04/13/2023   Chronic heart failure with preserved ejection fraction (HFpEF) (HCC) 04/13/2023   Non-ischemic cardiomyopathy (HCC) 04/13/2023   Pulmonary nodule 01/06/2023   Allergic rhinitis 04/23/2022   Amblyopia 04/23/2022   Hyperlipidemia 04/23/2022   Compulsive tobacco user syndrome 04/23/2022   Diarrhea 04/23/2022   Diabetic retinopathy associated with type 2 diabetes mellitus (HCC) 04/23/2022   Idiopathic osteoarthritis 04/23/2022   Poorly controlled diabetes mellitus (HCC) 12/21/2020   Candida vaginitis 12/21/2020   Type 2 diabetes mellitus with diabetic polyneuropathy, with long-term current use of insulin (HCC) 12/21/2020   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/18/2016   Primary osteoarthritis of both hands 10/06/2016   History of gastroesophageal reflux (GERD) 10/06/2016   History of COPD 10/06/2016   History of anxiety 10/06/2016   History of CHF (congestive heart failure) 10/06/2016   History of hypertension 10/06/2016   History of cardiac dysrhythmia 10/06/2016   Primary osteoarthritis of both feet 10/06/2016   Primary osteoarthritis of both knees 10/06/2016   History of infection/ ankle post operative  10/06/2016   Rheumatoid factor positive 10/06/2016   De Quervain's disease (radial styloid tenosynovitis) 06/09/2016   Tobacco abuse 05/23/2016   Hyperglycemia without ketosis    Bleeding hemorrhoids 11/03/2015   Acute blood loss anemia 11/03/2015   Hematochezia 11/01/2015   Back pain with sciatica 09/08/2015   Chronic systolic CHF (congestive heart failure) (HCC) 06/24/2015   Chest pain 06/03/2015   Essential hypertension    Anxiety    Gallstones    GERD (gastroesophageal reflux  disease)    COPD (chronic obstructive pulmonary disease) (HCC)    Dysrhythmia    Rash    Depression    Insulin dependent type 2 diabetes mellitus (HCC)    Fungal infection of nail 05/14/2015   Fuchs' corneal dystrophy 09/05/2012   Retinitis pigmentosa, both eyes 09/05/2012   Post-operative state 12/10/2011   Past Medical History:  Past Medical History:  Diagnosis Date   Anxiety    Arthritis    Chronic systolic heart failure (HCC)    EF 20-25% ECHO 05/2015   COPD (chronic obstructive pulmonary disease) (HCC)    Depression    Diabetes mellitus without complication (HCC)    Type II   Dysrhythmia    pt unsure of name of arrythmia - " my heart rate will drop all of a sudden" -no current treatment - atrial flutter   Fibrillation, atrial (HCC)    Gallstones    GERD (gastroesophageal reflux disease)    Headache(784.0)    migraines   Hypertension    Osteomyelitis (HCC)    R ankle   Rash    arms   Past Surgical History:  Past Surgical History:  Procedure Laterality Date   ANKLE FRACTURE SURGERY Right 2015   CARDIAC CATHETERIZATION N/A 06/24/2015   Procedure: Left Heart Cath and Coronary Angiography;  Surgeon: [name], MD;  Location: [facility] INVASIVE CV LAB;  Service: Cardiovascular;  Laterality: N/A;   CARPAL TUNNEL RELEASE     bil   CHOLECYSTECTOMY  11/24/2011   Procedure: LAPAROSCOPIC CHOLECYSTECTOMY WITH INTRAOPERATIVE CHOLANGIOGRAM;  Surgeon: Clovis Pu. Cornett, MD;  Location: [facility] ORS;  Service: General;  Laterality: N/A;  Laparoscopic Cholecystectomy with Cholangiogram   COLONOSCOPY  ECTOPIC PREGNANCY SURGERY  many yrs ago   HARDWARE REMOVAL Right 07/15/2015   Procedure: RIGHT ANKLE HARDWARE REMOVAL, PLACEMENT OF STIMULAN ANTIBIOTIC BEADS AND PREVENA WOUND VAC.;  Surgeon: [name], MD;  Location: [facility] OR;  Service: Orthopedics;  Laterality: Right;   TOOTH EXTRACTION N/A 12/03/2016   Procedure: DENTAL EXTRACTIONS with Alveoloplasty;  Surgeon: Ocie Doyne, DDS;   Location[facility] OR;  Service: Oral Surgery;  Laterality: N/A;   HPI:  [name] is a 62 y.o. female who presented with transient numbness to her whole face and right arm lasting approximately 30 to minutes before resolution.  MRI10/16: "Small acute infarcts in the right cerebellum, right parietal cortex, and left postcentral gyrus."  Pt with history of HLD, essential hypertension, type 2 diabetes mellitus, tobacco use, anxiety, depression, chronic systolic heart failure, GERD, GIB in 2017 and osteomyelitis.   Assessment / Plan / Recommendation Clinical Impression  Pt presents with functional cognition as assessed clinically. Pt was assessed using the COGNISTAT (see below for additional information).  Pt performed within the average range on all subtests administered except for word recall which showed mild impairment.  She has noticed mild recall changes at home prior to admission and was going to speak with her doctor about this.  She believes today's performance is consistent with her baseline and that all symptoms have resolved.  She is planning on making healthier choices in her lifestyle and will follow up at needed regarding memory changes.  Pt does not have any acute changes as a result of her stroke and does not require ST services in house or after discharge.  COGNISTAT: All subtests are within the average range, except where otherwise specified.  Orientation:  11/12 Attention: 7/8 Comprehension: 6/6 Repetition: 12/12 Naming: 7/8 Construction: clock drawing, 100% accurate Memory: 8/12, Mild Impairment Calculations: 3/4 Similarities: 7/8 Judgment: 5/6     SLP Assessment  SLP Recommendation/Assessment: Patient does not need any further Speech Lanaguage Pathology Services SLP Visit Diagnosis: Cognitive communication deficit (R41.841)    Recommendations for follow up therapy are one component of a multi-disciplinary discharge planning process, led by the attending  physician.  Recommendations may be updated based on patient status, additional functional criteria and insurance authorization.    Follow Up Recommendations  No SLP follow up    Assistance Recommended at Discharge  None  Functional Status Assessment Patient has not had a recent decline in their functional status  Frequency and Duration   N/A        SLP Evaluation Cognition  Overall Cognitive Status: Within Functional Limits for tasks assessed Arousal/Alertness: Awake/alert Orientation Level: Oriented X4 Year: 2024 Month: October Day of Week: Incorrect Attention: Focused;Sustained Focused Attention: Appears intact Sustained Attention: Appears intact Memory: Impaired Memory Impairment: Decreased short term memory Awareness: Appears intact Problem Solving: Appears intact Executive Function: Reasoning Reasoning: Appears intact       Comprehension  Auditory Comprehension Overall Auditory Comprehension: Appears within functional limits for tasks assessed Commands: Within Functional Limits Conversation: Complex Reading Comprehension Reading Status: Not tested    Expression Expression Primary Mode of Expression: Verbal Verbal Expression Overall Verbal Expression: Appears within functional limits for tasks assessed Initiation: No impairment Level of Generative/Spontaneous Verbalization: Conversation Repetition: No impairment Naming: No impairment Pragmatics: No impairment Written Expression Dominant Hand: Right Written Expression: Not tested   Oral / Motor  Oral Motor/Sensory Function Overall Oral Motor/Sensory Function: Within functional limits Motor Speech Overall Motor Speech: Appears within functional limits for tasks assessed Respiration:  Within functional limits Phonation: Normal Resonance: Within functional limits Articulation: Within functional limitis (slight articulation differences, no impact on intelligibility) Intelligibility: Intelligible Motor  Planning: Witnin functional limits Motor Speech Errors: Not applicable            Kerrie Pleasure, MA, CCC-SLP Acute Rehabilitation Services Office: [phone] 04/14/2023, 10:10 AM

## 2023-04-14 NOTE — Telephone Encounter (Signed)
Requested Prescriptions  Refused Prescriptions Disp Refills   fenofibrate (TRICOR) 145 MG tablet [Pharmacy Med Name: fenofibrate nanocrystallized 145 mg tablet] 90 tablet 0    Sig: TAKE ONE TABLET BY MOUTH EVERY DAY     Cardiovascular:  Antilipid - Fibric Acid Derivatives Failed - 04/14/2023  5:42 PM      Failed - AST in normal range and within 360 days    AST  Date Value Ref Range Status  04/13/2023 14 (L) 15 - 41 U/L Final         Failed - Lipid Panel in normal range within the last 12 months    Cholesterol, Total  Date Value Ref Range Status  03/21/2023 135 100 - 199 mg/dL Final   Cholesterol  Date Value Ref Range Status  04/14/2023 96 0 - 200 mg/dL Final   LDL Chol Calc (NIH)  Date Value Ref Range Status  03/21/2023 79 0 - 99 mg/dL Final   LDL Cholesterol  Date Value Ref Range Status  04/14/2023 43 0 - 99 mg/dL Final    Comment:           Total Cholesterol/HDL:CHD Risk Coronary Heart Disease Risk Table                     Men   Women  1/2 Average Risk   3.4   3.3  Average Risk       5.0   4.4  2 X Average Risk   9.6   7.1  3 X Average Risk  23.4   11.0        Use the calculated Patient Ratio above and the CHD Risk Table to determine the patient's CHD Risk.        ATP III CLASSIFICATION (LDL):  <100     mg/dL   Optimal  782-956  mg/dL   Near or Above                    Optimal  130-159  mg/dL   Borderline  213-086  mg/dL   High  >578     mg/dL   Very High Performed at [facility], [address] [place], [place] [address]    HDL  Date Value Ref Range Status  04/14/2023 35 (L) >40 mg/dL Final  95/28/4132 34 (L) >39 mg/dL Final   Triglycerides  Date Value Ref Range Status  04/14/2023 90 <150 mg/dL Final         Passed - ALT in normal range and within 360 days    ALT  Date Value Ref Range Status  04/13/2023 16 0 - 44 U/L Final         Passed - Cr in normal range and within 360 days    Creat  Date Value Ref Range Status  07/05/2016 0.59  0.50 - 1.05 mg/dL Final    Comment:      For patients > or = 61 years of age: The upper reference limit for Creatinine is approximately 13% higher for people identified as African-American.      Creatinine, Ser  Date Value Ref Range Status  04/13/2023 0.61 0.44 - 1.00 mg/dL Final         Passed - HGB in normal range and within 360 days    Hemoglobin  Date Value Ref Range Status  04/13/2023 14.4 12.0 - 15.0 g/dL Final  44/06/270 53.6 11.1 - 15.9 g/dL Final  Passed - HCT in normal range and within 360 days    HCT  Date Value Ref Range Status  04/13/2023 43.7 36.0 - 46.0 % Final   Hematocrit  Date Value Ref Range Status  11/25/2021 45.1 34.0 - 46.6 % Final         Passed - PLT in normal range and within 360 days    Platelets  Date Value Ref Range Status  04/13/2023 162 150 - 400 K/uL Final  11/25/2021 190 150 - 450 x10E3/uL Final         Passed - WBC in normal range and within 360 days    WBC  Date Value Ref Range Status  04/13/2023 8.4 4.0 - 10.5 K/uL Final         Passed - eGFR is 30 or above and within 360 days    GFR, Est African American  Date Value Ref Range Status  07/05/2016 >89 >=60 mL/min Final   GFR calc Af Amer  Date Value Ref Range Status  07/11/2020 114 >59 mL/min/1.73 Final    Comment:    **In accordance with recommendations from the NKF-ASN Task force,**   [facility] is in the process of updating its eGFR calculation to the   2021 CKD-EPI creatinine equation that estimates kidney function   without a race variable.    GFR, Est Non African American  Date Value Ref Range Status  07/05/2016 >89 >=60 mL/min Final   GFR, Estimated  Date Value Ref Range Status  04/13/2023 >60 >60 mL/min Final    Comment:    (NOTE) Calculated using the CKD-EPI Creatinine Equation (2021)    eGFR  Date Value Ref Range Status  03/21/2023 100 >59 mL/min/1.73 Final         Passed - Valid encounter within last 12 months    Recent Outpatient Visits            4 weeks ago Neck pain   [facility] [place], Odette Horns, MD   10 months ago Smoking greater than 20 pack years   [facility] Hoy Register, MD   1 year ago Dizziness   [facility] [place], Odette Horns, MD   1 year ago Type 2 diabetes mellitus with other neurologic complication, with long-term current use of insulin [facility])   [facility] [place], [place], [place]   1 year ago Type 2 diabetes mellitus with other neurologic complication, with long-term current use of insulin [facility])   [facility] Hoy Register, MD       Future Appointments             In 1 month Perlie Gold, [name]  [facility]   In 1 month Byrum, Les Pou, MD [facility] [place] [facility] [place]   In 5 months Hoy Register, MD [facility]

## 2023-04-17 NOTE — Plan of Care (Signed)
CHL Tonsillectomy/Adenoidectomy, Postoperative PEDS care plan entered in error.

## 2023-04-21 ENCOUNTER — Ambulatory Visit (INDEPENDENT_AMBULATORY_CARE_PROVIDER_SITE_OTHER): Payer: Medicaid Other | Admitting: Neurology

## 2023-04-21 ENCOUNTER — Encounter: Payer: Self-pay | Admitting: Neurology

## 2023-04-21 VITALS — BP 108/71 | HR 81

## 2023-04-21 DIAGNOSIS — R519 Headache, unspecified: Secondary | ICD-10-CM | POA: Diagnosis not present

## 2023-04-21 DIAGNOSIS — B0229 Other postherpetic nervous system involvement: Secondary | ICD-10-CM

## 2023-04-21 DIAGNOSIS — I639 Cerebral infarction, unspecified: Secondary | ICD-10-CM | POA: Diagnosis not present

## 2023-04-21 MED ORDER — NURTEC 75 MG PO TBDP: 75.0000 mg | ORAL_TABLET | ORAL | 11 refills | Status: DC

## 2023-04-21 NOTE — Patient Instructions (Signed)
Continue current medications  Start Nurtec 75 mg every other day  Follow up with cardiology as scheduled  Return in 6 months or sooner if worse

## 2023-04-21 NOTE — Progress Notes (Signed)
[facility]  PATIENT: [name] DOB: 06/05/1961  REQUESTING CLINICIAN: Hoy Register, MD HISTORY FROM: Patient  REASON FOR VISIT: Headaches/Dizziness/Neuropathic pain    HISTORICAL  CHIEF COMPLAINT:  Chief Complaint  Patient presents with   Follow-up    Rm 13, still having headaches, dizziness, hospital admission 10/15-10/17-MRI showed previos recent TIA   INTERVAL HISTORY 04/21/2023:  Patient presents today for follow-up, she is accompanied by her daughter, last visit was in February at that time we started her on amitriptyline for headache and Lyrica for post herpetic neuralgia.  She reports on October 15 she presented to the hospital because she woke up with facial numbness and right arm numbness lasting for 30 minutes.  In the ED she was found to have multiple strokes in the left postcentral gyrus, right cerebellum and right parietal cortex.  Stroke etiology likely cardioembolic.  Her EF was 45 to 50% with left ventricle global hypokinesis but she was not found to be in A-fib.  She was recommended a cardiac monitor which has not been completed.  She is pending her follow-up with cardiology.  From the strokes, she denies any deficit as her symptoms only lasted for 30 minutes.  She tells me that her headaches are still present, having 4-5 headaches per day.  Amitriptyline is not helpful.    Hospital Course and Summary: [name] is a 62 y.o. female with medical history significant of insulin-dependent type 2 diabetes, hypertension, HFpEF, anxiety, depression, GERD, IBS with diarrhea, cervical radiculopathy, left shoulder adhesive capsulitis, neuropathy, COPD presented to the ED  with transient numbness to her whole face and right arm lasting approximately 30 to minutes before resolution. Patient reports having intermittent palpitations over the past few years with an increase in frequency recently each lasting approximately 3 to 4 minutes.   Patient reports having cardiac palpitations prior to onset of presenting symptoms.  Mori significant for acute CVA, admitted for workup, please see discussion below Acute Ischemic Infarct:  -Small acute infarcts in the right cerebellum, right parietal cortex, and left postcentral gyrus, etiology: Likely cardioembolic in nature  -CT head without contrast 10/16: No acute intracranial abnormality -CTA head & neck unremarkable -MRI small acute infarcts in the right cerebellum, right parietal cortex, and left precentral gyrus.  Remote small vessel infarcts seen along the cerebral cortex and cerebellum, question risk factors for recurrent embolic disease. -2D Echo LVEF 45-50%, left ventricular global hypokinesis. -LDL 79 -HgbA1c 11.4 -With greatly appreciated, recommendation to start Plavix due to intolerance to aspirin.   -Cardiology input greatly appreciated, she has not had any arrhythmia/afib this admission.  Cardiology favor 30 day monitor then possible ILR as outpatient to check for PAF   INTERVAL HISTORY 08/23/2022:  Patient presents today for follow-up, last visit was in October.  At that time plan was to continue with amitriptyline 25 mg at night and Lyrica 150 mg twice daily.  She reports now that those 2 medications are not helpful.  She still having occasional headache and neuropathic pain.  She also report increased stress related to her sister's death.  She feels that her gait is still abnormal, she sways when she walk, denies any falls.   INTERVAL HISTORY 04/20/22: Patient presents today for follow-up, last visit was in July, at that time we started her on amitriptyline for headaches preventive medication and also Lyrica for the neuropathy.  Patient reports the combination of amitriptyline and Lyrica is very beneficial, her migraines are well controlled and her neuropathy  symptoms are also well controlled with the Lyrica 75/150. She also reports that her dizziness improved.  She does report  increased stress, that her sister is in admitted in the ICU and then she is taking care of her and also her niece (sister son) who was diagnosed with cerebral palsy.   HISTORY OF PRESENT ILLNESS:  This is a 62 year old woman past medical history of COPD, diabetes mellitus, neuropathy, depression, hypertension and hyperlipidemia who is presenting with complaint of dizziness, that she described as room spinning sensation, can last 3 seconds up to a minute.  During this time, she feels really scared, and has nausea.  On top of that she also complained of headaches, reported daily headache, sometimes she can wake up with headaches.  Described headache as aching pain.  In the past, she had tried Amitriptyline with good results with the headaches.  Patient also complain of neuropathic pain mostly on the left side.  She did have a shingles outbreak on the left side of her back and now in the same region she has itchiness, numbness and stabbing pain.  She reported her neuropathic pain got worse since starting her insulin and controlling her diabetes.  Currently her A1c went from 13 to almost 7.     OTHER MEDICAL CONDITIONS: COPD PAD, hypertension, hyperlipidemia, diabetes mellitus, depression and neuropathy, shingles   REVIEW OF SYSTEMS: Full 14 system review of systems performed and negative with exception of: as noted in the HPI   ALLERGIES: Allergies  Allergen Reactions   Biaxin [Clarithromycin] Shortness Of Breath and Swelling   Lisinopril Swelling and Cough    Lip edema   Sulfa Antibiotics Anaphylaxis, Shortness Of Breath, Swelling and Hypertension   Chlorthalidone Nausea And Vomiting and Other (See Comments)    Clammy, Tachycardia, Headache    Daptomycin Nausea And Vomiting   Latex Hives and Rash   Amoxicillin-Pot Clavulanate Diarrhea    Has patient had a PCN reaction causing immediate rash, facial/tongue/throat swelling, SOB or lightheadedness with hypotension:No Has patient had a PCN  reaction causing severe rash involving mucus membranes or skin necrosis:No Has patient had a PCN reaction that required hospitalization:No Has patient had a PCN reaction occurring within THE LAST 10 YEARS.  #  #  #  YES  #  #  #  If all of the above answers are "NO", then may proceed with Cephalosporin use.    Aspirin Nausea And Vomiting and Rash    On 325mg  dosage   Cholestyramine Nausea And Vomiting   Dilaudid [Hydromorphone Hcl] Nausea And Vomiting   Lasix [Furosemide] Nausea And Vomiting and Other (See Comments)    Headache     HOME MEDICATIONS: Outpatient Medications Prior to Visit  Medication Sig Dispense Refill   ACCU-CHEK GUIDE test strip USE AS DIRECTED IN THE MORNING, AT NOON, IN THE EVENING AND AT BEDTIME 400 strip 3   Accu-Chek Softclix Lancets lancets Use to check blood sugar THREE TIMES DAILY 100 each 2   albuterol (VENTOLIN HFA) 108 (90 Base) MCG/ACT inhaler Inhale 2 puffs into the lungs every 4 (four) hours as needed for wheezing or shortness of breath. 1 each 0   amitriptyline (ELAVIL) 50 MG tablet Take 1 tablet (50 mg total) by mouth at bedtime. 90 tablet 3   BD PEN NEEDLE NANO 2ND GEN 32G X 4 MM MISC USE 1 PEN NEEDLE UNDER THE SKIN FOUR TIMES DAILY 400 each 2   benzonatate (TESSALON) 100 MG capsule Take 1 capsule (100 mg total)  by mouth 2 (two) times daily as needed for cough. 20 capsule 0   Blood Glucose Monitoring Suppl (ACCU-CHEK GUIDE ME) w/Device KIT Use to check blood sugar TID. 1 kit 0   cetirizine (ZYRTEC) 10 MG tablet TAKE 1 TABLET(10 MG) BY MOUTH DAILY 90 tablet 2   clopidogrel (PLAVIX) 75 MG tablet Take 1 tablet (75 mg total) by mouth daily. 30 tablet 1   Continuous Glucose Sensor (DEXCOM G6 SENSOR) MISC USE 1 DEVICE AS DIRECTED 9 each 4   Continuous Glucose Transmitter (DEXCOM G6 TRANSMITTER) MISC USE AS DIRECTED 1 each 3   dapagliflozin propanediol (FARXIGA) 10 MG TABS tablet Take 1 tablet (10 mg total) by mouth daily before breakfast. 30 tablet 0    dicyclomine (BENTYL) 10 MG capsule TAKE ONE CAPSULE BY MOUTH THREE TIMES DAILY BEFORE MEALS (Patient taking differently: Take 10 mg by mouth 2 (two) times daily before a meal.) 270 capsule 0   diphenoxylate-atropine (LOMOTIL) 2.5-0.025 MG tablet TAKE ONE TABLET BY MOUTH TWICE DAILY 60 tablet 3   fenofibrate (TRICOR) 145 MG tablet TAKE ONE TABLET BY MOUTH EVERY DAY 90 tablet 0   FLUoxetine (PROZAC) 40 MG capsule TAKE ONE CAPSULE BY MOUTH AT BEDTIME 90 capsule 1   hydrOXYzine (ATARAX) 10 MG tablet Take 1 tablet (10 mg total) by mouth 3 (three) times daily as needed. (Patient taking differently: Take 10 mg by mouth in the morning and at bedtime.) 270 tablet 1   insulin glargine (LANTUS SOLOSTAR) 100 UNIT/ML Solostar Pen Inject 30 Units into the skin at bedtime. (Patient taking differently: Inject 24 Units into the skin at bedtime.)     Lancet Devices (ACCU-CHEK SOFTCLIX) lancets Use as instructed daily. 1 each 5   meclizine (ANTIVERT) 25 MG tablet Take 1 tablet (25 mg total) by mouth 3 (three) times daily as needed for dizziness. (Patient taking differently: Take 25 mg by mouth 2 (two) times daily.) 90 tablet 1   metoprolol succinate (TOPROL-XL) 25 MG 24 hr tablet Take 25 mg by mouth daily.     mupirocin ointment (BACTROBAN) 2 % Apply 1 Application topically 2 (two) times daily. (Patient taking differently: Apply 1 Application topically daily as needed (for folliculitis).) 22 g 1   NOVOLOG FLEXPEN 100 UNIT/ML FlexPen Max Daily 50 units per correction scale (Patient taking differently: Inject 10-30 Units into the skin 3 (three) times daily before meals.) 45 mL 2   omeprazole (PRILOSEC) 40 MG capsule Take 1 capsule (40 mg total) by mouth daily. 90 capsule 1   pregabalin (LYRICA) 150 MG capsule TAKE ONE CAPSULE BY MOUTH TWICE DAILY 60 capsule 5   promethazine (PHENERGAN) 25 MG tablet 1/2-1 tab every 4 to 6 hours prn nausea (Patient taking differently: Take 12.5-25 mg by mouth See admin instructions. Take  1/2-1 tablet by mouth every 4 to 6 hours as needed nausea) 20 tablet 0   rosuvastatin (CRESTOR) 20 MG tablet Take 1 tablet (20 mg total) by mouth daily. 30 tablet 1   Tiotropium Bromide Monohydrate (SPIRIVA RESPIMAT) 1.25 MCG/ACT AERS Inhale 2 puffs into the lungs daily. 4 g 5   doxycycline (VIBRAMYCIN) 100 MG capsule Take 1 capsule (100 mg total) by mouth 2 (two) times daily. (Patient not taking: Reported on 04/21/2023) 20 capsule 0   Insulin Disposable Pump (OMNIPOD 5 G6 PODS, GEN 5,) MISC USE 1 DEVICE EVERY 3 DAYS (Patient not taking: Reported on 04/21/2023) 30 each 3   Facility-Administered Medications Prior to Visit  Medication Dose Route Frequency Provider Last  Rate Last Admin   regadenoson (LEXISCAN) injection SOLN 0.4 mg  0.4 mg Intravenous Once Lars Masson, MD       technetium tetrofosmin (TC-MYOVIEW) injection 32.8 millicurie  32.8 millicurie Intravenous Once PRN Lars Masson, MD        PAST MEDICAL HISTORY: Past Medical History:  Diagnosis Date   Anxiety    Arthritis    Chronic systolic heart failure (HCC)    EF 20-25% ECHO 05/2015   COPD (chronic obstructive pulmonary disease) (HCC)    Depression    Diabetes mellitus without complication (HCC)    Type II   Dysrhythmia    pt unsure of name of arrythmia - " my heart rate will drop all of a sudden" -no current treatment - atrial flutter   Fibrillation, atrial (HCC)    Gallstones    GERD (gastroesophageal reflux disease)    Headache(784.0)    migraines   Hypertension    Osteomyelitis (HCC)    R ankle   Rash    arms    PAST SURGICAL HISTORY: Past Surgical History:  Procedure Laterality Date   ANKLE FRACTURE SURGERY Right 2015   CARDIAC CATHETERIZATION N/A 06/24/2015   Procedure: Left Heart Cath and Coronary Angiography;  Surgeon: [name], MD;  Location: [facility] INVASIVE CV LAB;  Service: Cardiovascular;  Laterality: N/A;   CARPAL TUNNEL RELEASE     bil   CHOLECYSTECTOMY  11/24/2011   Procedure:  LAPAROSCOPIC CHOLECYSTECTOMY WITH INTRAOPERATIVE CHOLANGIOGRAM;  Surgeon: Clovis Pu. Cornett, MD;  Location: [facility] ORS;  Service: General;  Laterality: N/A;  Laparoscopic Cholecystectomy with Cholangiogram   COLONOSCOPY     ECTOPIC PREGNANCY SURGERY  many yrs ago   HARDWARE REMOVAL Right 07/15/2015   Procedure: RIGHT ANKLE HARDWARE REMOVAL, PLACEMENT OF STIMULAN ANTIBIOTIC BEADS AND PREVENA WOUND VAC.;  Surgeon: [name], MD;  Location: [facility] OR;  Service: Orthopedics;  Laterality: Right;   TOOTH EXTRACTION N/A 12/03/2016   Procedure: DENTAL EXTRACTIONS with Alveoloplasty;  Surgeon: Ocie Doyne, DDS;  Location: [facility] OR;  Service: Oral Surgery;  Laterality: N/A;    FAMILY HISTORY: Family History  Problem Relation Age of Onset   Diabetes Mother    Lung cancer Father    Heart attack Father 67       CABG x4   Breast cancer Sister        Survivor     SOCIAL HISTORY: Social History   Socioeconomic History   Marital status: Divorced    Spouse name: Not on file   Number of children: Not on file   Years of education: Not on file   Highest education level: Not on file  Occupational History   Not on file  Tobacco Use   Smoking status: Former    Current packs/day: 1.00    Average packs/day: 1 pack/day for 20.3 years (20.3 ttl pk-yrs)    Types: Cigarettes, E-cigarettes    Start date: 09/19/2016   Smokeless tobacco: Never   Tobacco comments:    Pt stated smoking 12 cigarettes daily  updated 03/10/2023 amy marsh, cma    04/21/23, patient stopped smoking  Vaping Use   Vaping status: Every Day   Start date: 10/12/2016  Substance and Sexual Activity   Alcohol use: No    Alcohol/week: 0.0 standard drinks of alcohol   Drug use: No   Sexual activity: Not Currently    Birth control/protection: Post-menopausal  Other Topics Concern   Not on file  Social History Narrative   **  Merged History Encounter **    Right handed   Caffeine- 1 cup daily   Social Determinants of Health    Financial Resource Strain: Not on file  Food Insecurity: Patient Unable To Answer (04/13/2023)   Hunger Vital Sign    Worried About Running Out of Food in the Last Year: Patient unable to answer    Ran Out of Food in the Last Year: Patient unable to answer  Transportation Needs: No Transportation Needs (04/14/2023)   PRAPARE - Administrator, Civil Service (Medical): No    Lack of Transportation (Non-Medical): No  Physical Activity: Not on file  Stress: Not on file  Social Connections: Not on file  Intimate Partner Violence: Patient Unable To Answer (04/13/2023)   Humiliation, Afraid, Rape, and Kick questionnaire    Fear of Current or Ex-Partner: Patient unable to answer    Emotionally Abused: Patient unable to answer    Physically Abused: Patient unable to answer    Sexually Abused: Patient unable to answer    PHYSICAL EXAM  GENERAL EXAM/CONSTITUTIONAL: Vitals:  Vitals:   04/21/23 1317 04/21/23 1318  BP: 128/68 108/71  Pulse: 73 81  SpO2: 93% 94%   There is no height or weight on file to calculate BMI. Wt Readings from Last 3 Encounters:  04/12/23 150 lb (68 kg)  04/04/23 155 lb (70.3 kg)  03/16/23 155 lb 12.8 oz (70.7 kg)   Patient is in no distress; well developed, nourished and groomed; neck is supple  MUSCULOSKELETAL: Gait, strength, tone, movements noted in Neurologic exam below  NEUROLOGIC: MENTAL STATUS:      No data to display         awake, alert, oriented to person, place and time recent and remote memory intact normal attention and concentration language fluent, comprehension intact, naming intact fund of knowledge appropriate  CRANIAL NERVE:  2nd, 3rd, 4th, 6th - visual fields full to confrontation, extraocular muscles intact, no nystagmus 5th - facial sensation symmetric 7th - facial strength symmetric 8th - hearing intact 9th - palate elevates symmetrically, uvula midline 11th - shoulder shrug symmetric 12th - tongue  protrusion midline  MOTOR:  normal bulk and tone, full strength in the BUE, BLE  SENSORY:  normal and symmetric to light touch  COORDINATION:  finger-nose-finger, fine finger movements normal  GAIT/STATION:  Slow wide based, unable to tandem, mild romberg   DIAGNOSTIC DATA (LABS, IMAGING, TESTING) - I reviewed patient records, labs, notes, testing and imaging myself where available.  Lab Results  Component Value Date   WBC 8.4 04/13/2023   HGB 14.4 04/13/2023   HCT 43.7 04/13/2023   MCV 84.9 04/13/2023   PLT 162 04/13/2023      Component Value Date/Time   NA 135 04/13/2023 0006   NA 136 03/21/2023 0848   K 3.8 04/13/2023 0006   CL 102 04/13/2023 0006   CO2 25 04/13/2023 0006   GLUCOSE 220 (H) 04/13/2023 0006   BUN 12 04/13/2023 0006   BUN 10 03/21/2023 0848   CREATININE 0.61 04/13/2023 0006   CREATININE 0.59 07/05/2016 0947   CALCIUM 8.7 (L) 04/13/2023 0006   PROT 6.5 04/13/2023 0006   PROT 6.8 03/21/2023 0848   ALBUMIN 3.2 (L) 04/13/2023 0006   ALBUMIN 4.0 03/21/2023 0848   AST 14 (L) 04/13/2023 0006   ALT 16 04/13/2023 0006   ALKPHOS 82 04/13/2023 0006   BILITOT 0.5 04/13/2023 0006   BILITOT 0.3 03/21/2023 0848   GFRNONAA >60 04/13/2023 0006  GFRNONAA >89 07/05/2016 0947   GFRAA 114 07/11/2020 1002   GFRAA >89 07/05/2016 0947   Lab Results  Component Value Date   CHOL 96 04/14/2023   HDL 35 (L) 04/14/2023   LDLCALC 43 04/14/2023   TRIG 90 04/14/2023   CHOLHDL 2.7 04/14/2023   Lab Results  Component Value Date   HGBA1C 11.4 (H) 04/13/2023   No results found for: "VITAMINB12" Lab Results  Component Value Date   TSH 0.997 03/09/2021    MRI Brain 01/31/22 Scattered T2/FLAIR hyperintense foci in the hemispheres and cerebellum consistent with mild chronic microvascular ischemic changes or remote ischemic events.  These are stable compared to the 2016 MRI. Bilateral mastoid effusions.  These are usually caused by eustachian tube dysfunction. No acute  findings   MRI Brain 04/13/2023 Small acute infarcts in the right cerebellum, right parietal cortex, and left postcentral gyrus. 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  CTA Head and Neck 04/13/2023 1. No acute intracranial abnormality. The acute infarcts seen on same day brain MRI are below the threshold of CT. 2. No large vessel occlusion, hemodynamically significant stenosis, dissection, or aneurysm in the head or neck.   CT Temporal 03/05/2022 Mild mastoid opacification with negative nasopharynx, seen to some degree on scans over multiple years.   ASSESSMENT AND PLAN  62 y.o. year old female with medical conditions including hypertension, hyperlipidemia, diabetes mellitus, COPD and postherpetic neuralgia who is presenting for follow up for her headaches, neuropathy and new strokes likely cardioembolic etiology.  In term of her headaches, she is on amitriptyline 50 mg nightly, will prescribe her Nurtec 75 mg every other day.  For her postherpetic neuralgia, we will continue her on Lyrica 150 mg twice daily. In term of the strokes, based on multiple vascular territories, etiology likely cardioembolic.  She is on Plavix currently due to allergy to aspirin and pending a cardiac monitor.  Strongly advised patient to follow-up with cardiology to get her monitor for evaluation of atrial fibrillation.  As for now continue current medications, continue to follow-up with your doctors and return in 6 months or sooner if worse.   1. Nonintractable headache, unspecified chronicity pattern, unspecified headache type   2. Postherpetic neuralgia   3. Cerebrovascular accident (CVA), unspecified mechanism (HCC)     Patient Instructions  Continue current medications  Start Nurtec 75 mg every other day  Follow up with cardiology as scheduled  Return in 6 months or sooner if worse   No orders of the defined types  were placed in this encounter.   Meds ordered this encounter  Medications   Rimegepant Sulfate (NURTEC) 75 MG TBDP    Sig: Take 1 tablet (75 mg total) by mouth every other day.    Dispense:  16 tablet    Refill:  11    Return in about 6 months (around 10/20/2023).   Windell Norfolk, MD 04/22/2023, 7:18 PM  [facility] [address], [address] [place], [place] [address] [phone]

## 2023-04-23 ENCOUNTER — Inpatient Hospital Stay: Payer: Medicaid Other | Admitting: Family

## 2023-04-23 DIAGNOSIS — G459 Transient cerebral ischemic attack, unspecified: Secondary | ICD-10-CM

## 2023-04-23 DIAGNOSIS — I5032 Chronic diastolic (congestive) heart failure: Secondary | ICD-10-CM

## 2023-04-23 DIAGNOSIS — I428 Other cardiomyopathies: Secondary | ICD-10-CM | POA: Diagnosis not present

## 2023-04-23 DIAGNOSIS — R002 Palpitations: Secondary | ICD-10-CM | POA: Diagnosis not present

## 2023-04-23 DIAGNOSIS — I447 Left bundle-branch block, unspecified: Secondary | ICD-10-CM

## 2023-04-25 ENCOUNTER — Encounter: Payer: Self-pay | Admitting: Internal Medicine

## 2023-04-25 ENCOUNTER — Ambulatory Visit: Payer: Medicaid Other | Attending: Family | Admitting: Internal Medicine

## 2023-04-25 VITALS — BP 112/72 | HR 80 | Temp 98.2°F | Ht 67.0 in | Wt 152.0 lb

## 2023-04-25 DIAGNOSIS — Z8673 Personal history of transient ischemic attack (TIA), and cerebral infarction without residual deficits: Secondary | ICD-10-CM

## 2023-04-25 DIAGNOSIS — E119 Type 2 diabetes mellitus without complications: Secondary | ICD-10-CM

## 2023-04-25 DIAGNOSIS — Z09 Encounter for follow-up examination after completed treatment for conditions other than malignant neoplasm: Secondary | ICD-10-CM

## 2023-04-25 DIAGNOSIS — E1159 Type 2 diabetes mellitus with other circulatory complications: Secondary | ICD-10-CM

## 2023-04-25 DIAGNOSIS — I5022 Chronic systolic (congestive) heart failure: Secondary | ICD-10-CM

## 2023-04-25 DIAGNOSIS — N898 Other specified noninflammatory disorders of vagina: Secondary | ICD-10-CM

## 2023-04-25 DIAGNOSIS — Z7984 Long term (current) use of oral hypoglycemic drugs: Secondary | ICD-10-CM

## 2023-04-25 DIAGNOSIS — Z794 Long term (current) use of insulin: Secondary | ICD-10-CM

## 2023-04-25 DIAGNOSIS — I10 Essential (primary) hypertension: Secondary | ICD-10-CM

## 2023-04-25 DIAGNOSIS — Z87891 Personal history of nicotine dependence: Secondary | ICD-10-CM

## 2023-04-25 MED ORDER — FLUCONAZOLE 150 MG PO TABS
150.0000 mg | ORAL_TABLET | Freq: Every day | ORAL | 0 refills | Status: DC
Start: 2023-04-25 — End: 2023-05-03

## 2023-04-25 NOTE — Patient Instructions (Addendum)
Consider changing NovoLog insulin to 10 to 12 units with breakfast, and 14 units with lunch and dinner.  Keep your upcoming appointment with your endocrinologist in 2 weeks.  Keep upcoming appointment with cardiology.

## 2023-04-25 NOTE — Progress Notes (Signed)
Patient ID: [name], female    DOB: [birth year]  MRN: [id number]  CC: Hospitalization Follow-up (Hospitalization f/u./Vaginal itchiness  X11 days)   Subjective: [name] is a 62 y.o. female who presents for hospital follow up.  PCP is Dr. Alvis [name]. 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  She was also having intermittent palpitations.  Found to have small acute infarcts in the right cerebellum, right parietal cortex and left postcentral gyrus.  Thought to be likely embolic in nature.  CTA head and neck unremarkable.  Echo revealed decrease in EF to 45-50% with left central global hypokinesis.  LDL 79, A1c of 11.4. Started on Plavix.  Intolerant to aspirin.  Seen by cardiology.  30-day monitor applied to check for PAF.  Left on fenofibrate and Crestor.  Farxiga added due to CHF.  Today: Neurologic symptoms that sent her to the hospital have completely resolved.  Currently wearing 30-day cardiac monitor.  Taking Plavix as prescribed.  Reports compliance with Crestor 20 mg daily and Tricor 145 mg daily.  She discontinued smoking. HTN: Reports compliance with taking metoprolol XL 25 mg daily.  DM: Last saw her endocrinologist Dr. Brooks [name] 1 year ago.  Has an upcoming appointment in 2 weeks.  Most recent A1c during hospitalization was 11.4.  She attributes this to nonadherence to dietary recommendations.  States that she was eating a lot of sweets but has stopped doing so.  Currently on Lantus insulin 24 units in the morning and NovoLog sliding scale.  Reports blood sugars in the mornings have ranged 115-126, before lunch and dinner range 170-195.  On average, she takes 10 to 12 units of NovoLog with breakfast, 12 units with lunch and 10  units with dinner but then ends up having to take 5 to 6 units more because blood sugars remain elevated beyond 2 hours after dinner.  CHF: No chest pains, shortness of breath, PND, lower extremity edema.  She has not had any weight gain since she left the hospital.  Compliant with metoprolol and Farxiga.  Complains of vaginal itching which she thinks is due to vaginal yeast infection.  Just completed a course of antibiotics which she thinks may have caused it.  Patient Active Problem List   Diagnosis Date Noted   TIA (transient ischemic attack) 04/13/2023   Chronic heart failure with preserved ejection fraction (HFpEF) (HCC) 04/13/2023   Non-ischemic cardiomyopathy (HCC) 04/13/2023   Pulmonary nodule 01/06/2023   Allergic rhinitis 04/23/2022   Amblyopia 04/23/2022   Hyperlipidemia 04/23/2022   Compulsive tobacco user syndrome 04/23/2022   Diarrhea 04/23/2022   Diabetic retinopathy associated with type 2 diabetes mellitus (HCC) 04/23/2022   Idiopathic osteoarthritis 04/23/2022   Poorly controlled diabetes mellitus (HCC) 12/21/2020   Candida vaginitis 12/21/2020   Type 2 diabetes mellitus with diabetic polyneuropathy, with long-term current use of insulin (HCC) 12/21/2020   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/18/2016   Primary osteoarthritis of both hands 10/06/2016   History of gastroesophageal reflux (GERD) 10/06/2016   History of COPD 10/06/2016   History of anxiety 10/06/2016   History of CHF (congestive heart failure) 10/06/2016   History of hypertension 10/06/2016   History of cardiac dysrhythmia 10/06/2016   Primary osteoarthritis of both feet 10/06/2016   Primary osteoarthritis of  both knees 10/06/2016   History of infection/ ankle post operative  10/06/2016   Rheumatoid factor positive 10/06/2016   De Quervain's disease (radial styloid tenosynovitis) 06/09/2016   Tobacco abuse 05/23/2016   Hyperglycemia without ketosis    Bleeding hemorrhoids 11/03/2015   Acute blood loss anemia  11/03/2015   Hematochezia 11/01/2015   Back pain with sciatica 09/08/2015   Chronic systolic CHF (congestive heart failure) (HCC) 06/24/2015   Chest pain 06/03/2015   Essential hypertension    Anxiety    Gallstones    GERD (gastroesophageal reflux disease)    COPD (chronic obstructive pulmonary disease) (HCC)    Dysrhythmia    Rash    Depression    Insulin dependent type 2 diabetes mellitus (HCC)    Fungal infection of nail 05/14/2015   Fuchs' corneal dystrophy 09/05/2012   Retinitis pigmentosa, both eyes 09/05/2012   Post-operative state 12/10/2011     Current Outpatient Medications on File Prior to Visit  Medication Sig Dispense Refill   ACCU-CHEK GUIDE test strip USE AS DIRECTED IN THE MORNING, AT NOON, IN THE EVENING AND AT BEDTIME 400 strip 3   Accu-Chek Softclix Lancets lancets Use to check blood sugar THREE TIMES DAILY 100 each 2   albuterol (VENTOLIN HFA) 108 (90 Base) MCG/ACT inhaler Inhale 2 puffs into the lungs every 4 (four) hours as needed for wheezing or shortness of breath. 1 each 0   amitriptyline (ELAVIL) 50 MG tablet Take 1 tablet (50 mg total) by mouth at bedtime. 90 tablet 3   BD PEN NEEDLE NANO 2ND GEN 32G X 4 MM MISC USE 1 PEN NEEDLE UNDER THE SKIN FOUR TIMES DAILY 400 each 2   Blood Glucose Monitoring Suppl (ACCU-CHEK GUIDE ME) w/Device KIT Use to check blood sugar TID. 1 kit 0   cetirizine (ZYRTEC) 10 MG tablet TAKE 1 TABLET(10 MG) BY MOUTH DAILY 90 tablet 2   clopidogrel (PLAVIX) 75 MG tablet Take 1 tablet (75 mg total) by mouth daily. 30 tablet 1   Continuous Glucose Sensor (DEXCOM G6 SENSOR) MISC USE 1 DEVICE AS DIRECTED 9 each 4   Continuous Glucose Transmitter (DEXCOM G6 TRANSMITTER) MISC USE AS DIRECTED 1 each 3   dapagliflozin propanediol (FARXIGA) 10 MG TABS tablet Take 1 tablet (10 mg total) by mouth daily before breakfast. 30 tablet 0   dicyclomine (BENTYL) 10 MG capsule TAKE ONE CAPSULE BY MOUTH THREE TIMES DAILY BEFORE MEALS (Patient taking  differently: Take 10 mg by mouth 2 (two) times daily before a meal.) 270 capsule 0   diphenoxylate-atropine (LOMOTIL) 2.5-0.025 MG tablet TAKE ONE TABLET BY MOUTH TWICE DAILY 60 tablet 3   doxycycline (VIBRAMYCIN) 100 MG capsule Take 1 capsule (100 mg total) by mouth 2 (two) times daily. 20 capsule 0   fenofibrate (TRICOR) 145 MG tablet TAKE ONE TABLET BY MOUTH EVERY DAY 90 tablet 0   FLUoxetine (PROZAC) 40 MG capsule TAKE ONE CAPSULE BY MOUTH AT BEDTIME 90 capsule 1   hydrOXYzine (ATARAX) 10 MG tablet Take 1 tablet (10 mg total) by mouth 3 (three) times daily as needed. (Patient taking differently: Take 10 mg by mouth in the morning and at bedtime.) 270 tablet 1   Insulin Disposable Pump (OMNIPOD 5 G6 PODS, GEN 5,) MISC USE 1 DEVICE EVERY 3 DAYS 30 each 3   insulin glargine (LANTUS SOLOSTAR) 100 UNIT/ML Solostar Pen Inject 30 Units into the skin at bedtime. (Patient taking differently: Inject 24 Units into the skin at bedtime.)  Lancet Devices (ACCU-CHEK SOFTCLIX) lancets Use as instructed daily. 1 each 5   meclizine (ANTIVERT) 25 MG tablet Take 1 tablet (25 mg total) by mouth 3 (three) times daily as needed for dizziness. (Patient taking differently: Take 25 mg by mouth 2 (two) times daily.) 90 tablet 1   metoprolol succinate (TOPROL-XL) 25 MG 24 hr tablet Take 25 mg by mouth daily.     mupirocin ointment (BACTROBAN) 2 % Apply 1 Application topically 2 (two) times daily. (Patient taking differently: Apply 1 Application topically daily as needed (for folliculitis).) 22 g 1   NOVOLOG FLEXPEN 100 UNIT/ML FlexPen Max Daily 50 units per correction scale (Patient taking differently: Inject 10-30 Units into the skin 3 (three) times daily before meals.) 45 mL 2   omeprazole (PRILOSEC) 40 MG capsule Take 1 capsule (40 mg total) by mouth daily. 90 capsule 1   pregabalin (LYRICA) 150 MG capsule TAKE ONE CAPSULE BY MOUTH TWICE DAILY 60 capsule 5   rosuvastatin (CRESTOR) 20 MG tablet Take 1 tablet (20 mg  total) by mouth daily. 30 tablet 1   Tiotropium Bromide Monohydrate (SPIRIVA RESPIMAT) 1.25 MCG/ACT AERS Inhale 2 puffs into the lungs daily. 4 g 5   benzonatate (TESSALON) 100 MG capsule Take 1 capsule (100 mg total) by mouth 2 (two) times daily as needed for cough. (Patient not taking: Reported on 04/25/2023) 20 capsule 0   promethazine (PHENERGAN) 25 MG tablet 1/2-1 tab every 4 to 6 hours prn nausea (Patient not taking: Reported on 04/25/2023) 20 tablet 0   Current Facility-Administered Medications on File Prior to Visit  Medication Dose Route Frequency Provider Last Rate Last Admin   regadenoson (LEXISCAN) injection SOLN 0.4 mg  0.4 mg Intravenous Once Lars Masson, MD       technetium tetrofosmin (TC-MYOVIEW) injection 32.8 millicurie  32.8 millicurie Intravenous Once PRN Lars Masson, MD        Allergies  Allergen Reactions   Biaxin [Clarithromycin] Shortness Of Breath and Swelling   Lisinopril Swelling and Cough    Lip edema   Sulfa Antibiotics Anaphylaxis, Shortness Of Breath, Swelling and Hypertension   Chlorthalidone Nausea And Vomiting and Other (See Comments)    Clammy, Tachycardia, Headache    Daptomycin Nausea And Vomiting   Latex Hives and Rash   Amoxicillin-Pot Clavulanate Diarrhea    Has patient had a PCN reaction causing immediate rash, facial/tongue/throat swelling, SOB or lightheadedness with hypotension:No Has patient had a PCN reaction causing severe rash involving mucus membranes or skin necrosis:No Has patient had a PCN reaction that required hospitalization:No Has patient had a PCN reaction occurring within THE LAST 10 YEARS.  #  #  #  YES  #  #  #  If all of the above answers are "NO", then may proceed with Cephalosporin use.    Aspirin Nausea And Vomiting and Rash    On 325mg  dosage   Cholestyramine Nausea And Vomiting   Dilaudid [Hydromorphone Hcl] Nausea And Vomiting   Lasix [Furosemide] Nausea And Vomiting and Other (See Comments)     Headache     Social History   Socioeconomic History   Marital status: Divorced    Spouse name: Not on file   Number of children: Not on file   Years of education: Not on file   Highest education level: Not on file  Occupational History   Not on file  Tobacco Use   Smoking status: Former    Current packs/day: 1.00    Average  packs/day: 1 pack/day for 20.3 years (20.3 ttl pk-yrs)    Types: Cigarettes, E-cigarettes    Start date: 09/19/2016   Smokeless tobacco: Never   Tobacco comments:    Pt stated smoking 12 cigarettes daily  updated 03/10/2023 amy marsh, cma    04/21/23, patient stopped smoking  Vaping Use   Vaping status: Every Day   Start date: 10/12/2016  Substance and Sexual Activity   Alcohol use: No    Alcohol/week: 0.0 standard drinks of alcohol   Drug use: No   Sexual activity: Not Currently    Birth control/protection: Post-menopausal  Other Topics Concern   Not on file  Social History Narrative   ** Merged History Encounter **    Right handed   Caffeine- 1 cup daily   Social Determinants of Health   Financial Resource Strain: Medium Risk (04/25/2023)   Overall Financial Resource Strain (CARDIA)    Difficulty of Paying Living Expenses: Somewhat hard  Food Insecurity: Food Insecurity Present (04/25/2023)   Hunger Vital Sign    Worried About Running Out of Food in the Last Year: Sometimes true    Ran Out of Food in the Last Year: Sometimes true  Transportation Needs: No Transportation Needs (04/14/2023)   PRAPARE - Administrator, Civil Service (Medical): No    Lack of Transportation (Non-Medical): No  Physical Activity: Insufficiently Active (04/25/2023)   Exercise Vital Sign    Days of Exercise per Week: 3 days    Minutes of Exercise per Session: 10 min  Stress: No Stress Concern Present (04/25/2023)   [employer] of Occupational Health - Occupational Stress Questionnaire    Feeling of Stress : Not at all  Social Connections: Socially  Isolated (04/25/2023)   Social Connection and Isolation Panel [NHANES]    Frequency of Communication with Friends and Family: More than three times a week    Frequency of Social Gatherings with Friends and Family: More than three times a week    Attends Religious Services: Never    Database administrator or Organizations: No    Attends Banker Meetings: Never    Marital Status: Divorced  Catering manager Violence: Not At Risk (04/25/2023)   Humiliation, Afraid, Rape, and Kick questionnaire    Fear of Current or Ex-Partner: No    Emotionally Abused: No    Physically Abused: No    Sexually Abused: No    Family History  Problem Relation Age of Onset   Diabetes Mother    Lung cancer Father    Heart attack Father 55       CABG x4   Breast cancer Sister        Survivor     Past Surgical History:  Procedure Laterality Date   ANKLE FRACTURE SURGERY Right 2015   CARDIAC CATHETERIZATION N/A 06/24/2015   Procedure: Left Heart Cath and Coronary Angiography;  Surgeon: [name], MD;  Location: [facility];  Service: Cardiovascular;  Laterality: N/A;   CARPAL TUNNEL RELEASE     bil   CHOLECYSTECTOMY  11/24/2011   Procedure: LAPAROSCOPIC CHOLECYSTECTOMY WITH INTRAOPERATIVE CHOLANGIOGRAM;  Surgeon: Clovis Pu. Cornett, MD;  Location: [facility];  Service: General;  Laterality: N/A;  Laparoscopic Cholecystectomy with Cholangiogram   COLONOSCOPY     ECTOPIC PREGNANCY SURGERY  many yrs ago   HARDWARE REMOVAL Right 07/15/2015   Procedure: RIGHT ANKLE HARDWARE REMOVAL, PLACEMENT OF STIMULAN ANTIBIOTIC BEADS AND PREVENA WOUND VAC.;  Surgeon: [name], MD;  Location: [facility] OR;  Service: Orthopedics;  Laterality: Right;   TOOTH EXTRACTION N/A 12/03/2016   Procedure: DENTAL EXTRACTIONS with Alveoloplasty;  Surgeon: Ocie Doyne, DDS;  Location: [facility];  Service: Oral Surgery;  Laterality: N/A;    ROS: Review of Systems Negative except as stated above  PHYSICAL EXAM: BP  112/72 (BP Location: Left Arm, Patient Position: Sitting, Cuff Size: Normal)   Pulse 80   Temp 98.2 F (36.8 C) (Oral)   Ht 5\' 7"  (1.702 m)   Wt 152 lb (68.9 kg)   LMP 06/09/2011 Comment: verified BEFORE imaging  SpO2 95%   BMI 23.81 kg/m   Wt Readings from Last 3 Encounters:  04/25/23 152 lb (68.9 kg)  04/12/23 150 lb (68 kg)  04/04/23 155 lb (70.3 kg)    Physical Exam  General appearance - alert, well appearing, older Caucasian female and in no distress Mental status - normal mood, behavior, speech, dress, motor activity, and thought processes Neck - supple, no significant adenopathy Chest - clear to auscultation, no wheezes, rales or rhonchi, symmetric air entry Heart - normal rate, regular rhythm, normal S1, S2, no murmurs, rubs, clicks or gallops.  He has a cardiac monitor in place in the center of the upper chest Extremities - peripheral pulses normal, no pedal edema, no clubbing or cyanosis      Latest Ref Rng & Units 04/13/2023   12:06 AM 03/21/2023    8:48 AM 06/03/2022    8:40 AM  CMP  Glucose 70 - 99 mg/dL 010  272  536   BUN 8 - 23 mg/dL 12  10  8    Creatinine 0.44 - 1.00 mg/dL 6.44  0.34  7.42   Sodium 135 - 145 mmol/L 135  136  139   Potassium 3.5 - 5.1 mmol/L 3.8  4.2  4.2   Chloride 98 - 111 mmol/L 102  100  102   CO2 22 - 32 mmol/L 25  23  23    Calcium 8.9 - 10.3 mg/dL 8.7  9.0  9.3   Total Protein 6.5 - 8.1 g/dL 6.5  6.8  7.4   Total Bilirubin 0.3 - 1.2 mg/dL 0.5  0.3  0.3   Alkaline Phos 38 - 126 U/L 82  96  88   AST 15 - 41 U/L 14  21  10    ALT 0 - 44 U/L 16  12  8     Lipid Panel     Component Value Date/Time   CHOL 96 04/14/2023 0447   CHOL 135 03/21/2023 0848   TRIG 90 04/14/2023 0447   HDL 35 (L) 04/14/2023 0447   HDL 34 (L) 03/21/2023 0848   CHOLHDL 2.7 04/14/2023 0447   VLDL 18 04/14/2023 0447   LDLCALC 43 04/14/2023 0447   LDLCALC 79 03/21/2023 0848    CBC    Component Value Date/Time   WBC 8.4 04/13/2023 0818   RBC 5.15 (H)  04/13/2023 0818   HGB 14.4 04/13/2023 0818   HGB 14.9 11/25/2021 1645   HCT 43.7 04/13/2023 0818   HCT 45.1 11/25/2021 1645   PLT 162 04/13/2023 0818   PLT 190 11/25/2021 1645   MCV 84.9 04/13/2023 0818   MCV 88 11/25/2021 1645   MCH 28.0 04/13/2023 0818   MCHC 33.0 04/13/2023 0818   RDW 13.4 04/13/2023 0818   RDW 12.5 11/25/2021 1645   LYMPHSABS 3.5 04/13/2023 0006   LYMPHSABS 3.0 11/25/2021 1645   MONOABS 0.5 04/13/2023 0006   EOSABS 0.3  04/13/2023 0006   EOSABS 0.3 11/25/2021 1645   BASOSABS 0.1 04/13/2023 0006   BASOSABS 0.1 11/25/2021 1645    ASSESSMENT AND PLAN: 1. Hospital discharge follow-up   2. History of CVA (cerebrovascular accident) without residual deficits Continue Plavix, Crestor, Tricor.  Discussed the importance of better diabetes control and good blood pressure control.  She has seen nephrology already and follow-up.  Keep upcoming appointment with cardiology. - CBC  3. Chronic systolic CHF (congestive heart failure) (HCC) Stable.  Continue metoprolol and Farxiga.  Blood pressure is a little too soft for spironolactone to be added. - Basic metabolic panel  4. Hypertension complicating diabetes (HCC) At goal.  Continue metoprolol  5. Type 2 diabetes mellitus with other circulatory complication, with long-term current use of insulin (HCC) 6. Insulin long-term use (HCC) 7. Diabetes mellitus treated with oral medication (HCC) Dietary counseling given.  Continue Lantus insulin 24 units daily.  Continue her NovoLog sliding scale.  However I advised that she may need to take 14 units at lunch and dinner since Premeal blood sugars are higher  8. Vaginal itching Will treat prophylactically with Diflucan for vaginal yeast - fluconazole (DIFLUCAN) 150 MG tablet; Take 1 tablet (150 mg total) by mouth daily.  Dispense: 1 tablet; Refill: 0  9. Former smoker Commended her on quitting.  Encouraged to remain tobacco free.    Patient was given the opportunity to  ask questions.  Patient verbalized understanding of the plan and was able to repeat key elements of the plan.   This documentation was completed using Paediatric nurse.  Any transcriptional errors are unintentional.  No orders of the defined types were placed in this encounter.    Requested Prescriptions    No prescriptions requested or ordered in this encounter    No follow-ups on file.  Jonah Blue, MD, FACP

## 2023-04-26 LAB — CBC
Hematocrit: 44.4 % (ref 34.0–46.6)
Hemoglobin: 14.1 g/dL (ref 11.1–15.9)
MCH: 27.5 pg (ref 26.6–33.0)
MCHC: 31.8 g/dL (ref 31.5–35.7)
MCV: 87 fL (ref 79–97)
Platelets: 216 10*3/uL (ref 150–450)
RBC: 5.13 x10E6/uL (ref 3.77–5.28)
RDW: 13.2 % (ref 11.7–15.4)
WBC: 9.5 10*3/uL (ref 3.4–10.8)

## 2023-04-26 LAB — BASIC METABOLIC PANEL
BUN/Creatinine Ratio: 12 (ref 12–28)
BUN: 8 mg/dL (ref 8–27)
CO2: 20 mmol/L (ref 20–29)
Calcium: 9.3 mg/dL (ref 8.7–10.3)
Chloride: 105 mmol/L (ref 96–106)
Creatinine, Ser: 0.66 mg/dL (ref 0.57–1.00)
Glucose: 110 mg/dL — ABNORMAL HIGH (ref 70–99)
Potassium: 4.3 mmol/L (ref 3.5–5.2)
Sodium: 141 mmol/L (ref 134–144)
eGFR: 100 mL/min/{1.73_m2} (ref >59)

## 2023-04-27 ENCOUNTER — Other Ambulatory Visit: Payer: Self-pay | Admitting: Internal Medicine

## 2023-04-27 ENCOUNTER — Other Ambulatory Visit: Payer: Self-pay | Admitting: Family Medicine

## 2023-04-27 DIAGNOSIS — I10 Essential (primary) hypertension: Secondary | ICD-10-CM

## 2023-04-28 ENCOUNTER — Encounter (HOSPITAL_COMMUNITY): Payer: Self-pay

## 2023-04-28 ENCOUNTER — Emergency Department (HOSPITAL_COMMUNITY)
Admission: EM | Admit: 2023-04-28 | Discharge: 2023-04-29 | Disposition: A | Discharge status: Home / Self Care | Payer: Medicaid Other | Attending: Emergency Medicine | Admitting: Emergency Medicine

## 2023-04-28 DIAGNOSIS — E119 Type 2 diabetes mellitus without complications: Secondary | ICD-10-CM | POA: Diagnosis not present

## 2023-04-28 DIAGNOSIS — M79601 Pain in right arm: Secondary | ICD-10-CM | POA: Insufficient documentation

## 2023-04-28 DIAGNOSIS — Z7982 Long term (current) use of aspirin: Secondary | ICD-10-CM | POA: Diagnosis not present

## 2023-04-28 DIAGNOSIS — Z794 Long term (current) use of insulin: Secondary | ICD-10-CM | POA: Diagnosis not present

## 2023-04-28 DIAGNOSIS — Z7902 Long term (current) use of antithrombotics/antiplatelets: Secondary | ICD-10-CM | POA: Insufficient documentation

## 2023-04-28 DIAGNOSIS — Z8673 Personal history of transient ischemic attack (TIA), and cerebral infarction without residual deficits: Secondary | ICD-10-CM | POA: Diagnosis not present

## 2023-04-28 DIAGNOSIS — Z7984 Long term (current) use of oral hypoglycemic drugs: Secondary | ICD-10-CM | POA: Diagnosis not present

## 2023-04-28 DIAGNOSIS — Z91040 Latex allergy status: Secondary | ICD-10-CM | POA: Insufficient documentation

## 2023-04-28 NOTE — ED Triage Notes (Signed)
Pt is coming in for lower right arm, she says its feels like a pain ripping sensation in her right arm, no recent Traumas to the arm, she does have a Hx of strokes previously but is on blood thinners, Pulses was palpated in the right and left radial with them being equal bilaterally and +2. She has no other complaints or neurological deficits at this time.

## 2023-04-28 NOTE — Telephone Encounter (Signed)
Called patient in regard to MyChart message.  Reports that since yesterday, each time she tries to straighten her arm at the elbow she has a tingling pain from her thumb to elbow.  There is no new weakness.  The pain is each time she tries to straighten.  Fine otherwise.  She has used Tyl ES and heat which have helped.  No other symptoms.  Has not been working with physical therapy yet.   I asked her to contact her PCP for further evaluation and recommendations.  I adv patient I would send this message to her as well.

## 2023-04-29 ENCOUNTER — Emergency Department (HOSPITAL_COMMUNITY): Payer: Medicaid Other

## 2023-04-29 ENCOUNTER — Ambulatory Visit (HOSPITAL_COMMUNITY)
Admission: RE | Admit: 2023-04-29 | Discharge: 2023-04-29 | Disposition: A | Discharge status: Home / Self Care | Payer: Medicaid Other | Source: Ambulatory Visit | Attending: Medical | Admitting: Medical

## 2023-04-29 ENCOUNTER — Encounter: Payer: Self-pay | Admitting: Family Medicine

## 2023-04-29 DIAGNOSIS — M79601 Pain in right arm: Secondary | ICD-10-CM | POA: Diagnosis not present

## 2023-04-29 DIAGNOSIS — R52 Pain, unspecified: Secondary | ICD-10-CM | POA: Diagnosis present

## 2023-04-29 MED ORDER — ACETAMINOPHEN 325 MG PO TABS
650.0000 mg | ORAL_TABLET | Freq: Once | ORAL | Status: AC
  Administered 2023-04-29: 650 mg via ORAL
  Filled 2023-04-29: qty 2

## 2023-04-29 NOTE — Discharge Instructions (Signed)
I am suspicious that your arm pain is likely secondary to a superficial blood clot.  I have sent you for an ultrasound outpatient, if it is a deep, you should come back to the ER immediately.  If you have any severe shortness of breath, swelling of the arm, inability to use the arm, or lack of pulse please return to the ER immediately.  Use heat for the area, take Tylenol for your pain.

## 2023-04-29 NOTE — ED Notes (Signed)
Patient transported to X-ray 

## 2023-04-29 NOTE — ED Provider Notes (Signed)
[place] EMERGENCY DEPARTMENT AT [facility] Provider Note   CSN: [id number] Arrival date & time: 04/28/23  2340     History  Chief Complaint  Patient presents with   Arm Pain    [name] is a 62 y.o. female, history of CVAs, diabetes, who presents to the ED secondary to right arm pain, has been going on for the last day.  She states she was here on the 28th, they drew blood, and that she has now developed pain in her right crease of her elbow, that is been going on for the last day.  She states very painful to straighten neck, but has no difficulty with shaking hands and moving that arm back-and-forth.  She states it feels red and a bit swollen, and like there is a knot in there.  She is on aspirin and Plavix, but no use of any kind of Coumadin, Eliquis, Xarelto.  She denies any kind of increased redness, but does state that it is a little bit swollen.  Denies any trauma to the area.  States it feels like it goes down to her thumb.  Denies any chest pain, shortness of breath.  States it only happens when she moves her arm.    Home Medications Prior to Admission medications   Medication Sig Start Date End Date Taking? Authorizing Provider  ACCU-CHEK GUIDE test strip USE AS DIRECTED IN THE MORNING, AT NOON, IN THE EVENING AND AT BEDTIME 08/30/22   Shamleffer, Konrad Dolores, MD  Accu-Chek Softclix Lancets lancets Use to check blood sugar THREE TIMES DAILY 06/09/21   Pollyann Savoy, MD  albuterol (VENTOLIN HFA) 108 (90 Base) MCG/ACT inhaler Inhale 2 puffs into the lungs every 4 (four) hours as needed for wheezing or shortness of breath. 04/05/23   Roxy Horseman, [name]  amitriptyline (ELAVIL) 50 MG tablet Take 1 tablet (50 mg total) by mouth at bedtime. 08/23/22 08/18/23  Windell Norfolk, MD  BD PEN NEEDLE NANO 2ND GEN 32G X 4 MM MISC USE 1 PEN NEEDLE UNDER THE SKIN FOUR TIMES DAILY 12/27/22   Shamleffer, Konrad Dolores, MD  Blood Glucose Monitoring Suppl (ACCU-CHEK  GUIDE ME) w/Device KIT Use to check blood sugar TID. 01/30/21   Hoy Register, MD  cetirizine (ZYRTEC) 10 MG tablet TAKE 1 TABLET(10 MG) BY MOUTH DAILY 08/10/22   Hoy Register, MD  clopidogrel (PLAVIX) 75 MG tablet Take 1 tablet (75 mg total) by mouth daily. 04/14/23 06/13/23  Elgergawy, Leana Roe, MD  Continuous Glucose Sensor (DEXCOM G6 SENSOR) MISC USE 1 DEVICE AS DIRECTED 10/22/22   Shamleffer, Konrad Dolores, MD  Continuous Glucose Transmitter (DEXCOM G6 TRANSMITTER) MISC USE AS DIRECTED 11/02/22   Shamleffer, Konrad Dolores, MD  dapagliflozin propanediol (FARXIGA) 10 MG TABS tablet Take 1 tablet (10 mg total) by mouth daily before breakfast. 04/14/23   Elgergawy, Leana Roe, MD  dicyclomine (BENTYL) 10 MG capsule TAKE ONE CAPSULE BY MOUTH THREE TIMES DAILY BEFORE MEALS Patient taking differently: Take 10 mg by mouth 2 (two) times daily before a meal. 03/16/23   Newlin, Odette Horns, MD  diphenoxylate-atropine (LOMOTIL) 2.5-0.025 MG tablet TAKE ONE TABLET BY MOUTH TWICE DAILY 03/17/23   Hoy Register, MD  fenofibrate (TRICOR) 145 MG tablet TAKE ONE TABLET BY MOUTH EVERY DAY 04/12/23   Hoy Register, MD  fluconazole (DIFLUCAN) 150 MG tablet Take 1 tablet (150 mg total) by mouth daily. 04/25/23   Marcine Matar, MD  FLUoxetine (PROZAC) 40 MG capsule TAKE ONE CAPSULE BY MOUTH  AT BEDTIME 03/18/23   Hoy Register, MD  hydrOXYzine (ATARAX) 10 MG tablet Take 1 tablet (10 mg total) by mouth 3 (three) times daily as needed. Patient taking differently: Take 10 mg by mouth in the morning and at bedtime. 03/16/23   Hoy Register, MD  Insulin Aspart FlexPen (NOVOLOG) 100 UNIT/ML INJECT A MAXIMUM OF 50 UNITS UNDER THE SKIN DAILY AS DIRECTED PER CORRECTION SCALE 04/27/23   Shamleffer, Konrad Dolores, MD  Insulin Disposable Pump (OMNIPOD 5 G6 PODS, GEN 5,) MISC USE 1 DEVICE EVERY 3 DAYS 10/22/22   Shamleffer, Konrad Dolores, MD  insulin glargine (LANTUS SOLOSTAR) 100 UNIT/ML Solostar Pen Inject 30 Units into  the skin at bedtime. Patient taking differently: Inject 24 Units into the skin at bedtime. 04/14/23   Elgergawy, Leana Roe, MD  Lancet Devices [facility]) lancets Use as instructed daily. 11/24/16   Hoy Register, MD  meclizine (ANTIVERT) 25 MG tablet Take 1 tablet (25 mg total) by mouth 3 (three) times daily as needed for dizziness. Patient taking differently: Take 25 mg by mouth 2 (two) times daily. 03/16/23   Hoy Register, MD  metoprolol succinate (TOPROL-XL) 25 MG 24 hr tablet Take 25 mg by mouth daily. 12/03/21   [provider]  mupirocin ointment (BACTROBAN) 2 % Apply 1 Application topically 2 (two) times daily. Patient taking differently: Apply 1 Application topically daily as needed (for folliculitis). 06/03/22   Hoy Register, MD  omeprazole (PRILOSEC) 40 MG capsule Take 1 capsule (40 mg total) by mouth daily. 03/16/23   Hoy Register, MD  pregabalin (LYRICA) 150 MG capsule TAKE ONE CAPSULE BY MOUTH TWICE DAILY 03/14/23   Penumalli, Glenford Bayley, MD  promethazine (PHENERGAN) 25 MG tablet 1/2-1 tab every 4 to 6 hours prn nausea Patient not taking: Reported on 04/25/2023 03/08/22   Hoy Register, MD  rosuvastatin (CRESTOR) 20 MG tablet Take 1 tablet (20 mg total) by mouth daily. 04/14/23 04/13/24  Elgergawy, Leana Roe, MD  Tiotropium Bromide Monohydrate (SPIRIVA RESPIMAT) 1.25 MCG/ACT AERS Inhale 2 puffs into the lungs daily. 01/06/23   Leslye Peer, MD      Allergies    Biaxin [clarithromycin], Lisinopril, Sulfa antibiotics, Chlorthalidone, Daptomycin, Latex, Amoxicillin-pot clavulanate, Aspirin, Cholestyramine, Dilaudid [hydromorphone hcl], and Lasix [furosemide]    Review of Systems   Review of Systems  Physical Exam Updated Vital Signs BP (!) 148/83 (BP Location: Left Arm)   Pulse 78   Temp 98.1 F (36.7 C) (Oral)   Resp 18   LMP 06/09/2011 Comment: verified BEFORE imaging  SpO2 95%  Physical Exam Vitals and nursing note reviewed.  Constitutional:       General: She is not in acute distress.    Appearance: She is well-developed.  HENT:     Head: Normocephalic and atraumatic.  Eyes:     Conjunctiva/sclera: Conjunctivae normal.  Cardiovascular:     Rate and Rhythm: Normal rate and regular rhythm.     Heart sounds: No murmur heard. Pulmonary:     Effort: Pulmonary effort is normal. No respiratory distress.     Breath sounds: Normal breath sounds.  Abdominal:     Palpations: Abdomen is soft.     Tenderness: There is no abdominal tenderness.  Musculoskeletal:        General: No swelling.     Cervical back: Neck supple.     Comments: TTP of medial aspect of antecubital fossa w/pain with extension of the elbow. ROM intact, including supination, pronation. Mild edema noted to L antecubital  fossa w/o erythema.  Skin:    General: Skin is warm and dry.     Capillary Refill: Capillary refill takes less than 2 seconds.  Neurological:     Mental Status: She is alert.  Psychiatric:        Mood and Affect: Mood normal.     ED Results / Procedures / Treatments   Labs (all labs ordered are listed, but only abnormal results are displayed) Labs Reviewed - No data to display  EKG None  Radiology DG Forearm Right  Result Date: 04/29/2023 CLINICAL DATA:  Pain with arm extension EXAM: RIGHT FOREARM - 2 VIEW COMPARISON:  None Available. FINDINGS: There is no evidence of fracture or other focal bone lesions. Soft tissues are unremarkable. IMPRESSION: Negative. Electronically Signed   By: Jasmine Pang M.D.   On: 04/29/2023 02:20   DG Elbow Complete Right  Result Date: 04/29/2023 CLINICAL DATA:  Pain with arm extension EXAM: RIGHT ELBOW - COMPLETE 3+ VIEW COMPARISON:  None Available. FINDINGS: There is no evidence of fracture, dislocation, or joint effusion. There is no evidence of arthropathy or other focal bone abnormality. Soft tissues are unremarkable. IMPRESSION: Negative. Electronically Signed   By: Jasmine Pang M.D.   On: 04/29/2023 02:19     Procedures Procedures    Medications Ordered in ED Medications  acetaminophen (TYLENOL) tablet 650 mg (650 mg Oral Given 04/29/23 0121)    ED Course/ Medical Decision Making/ A&P                                 Medical Decision Making Patient is a 62 year old female, here for right arm pain that is been on for the last day, she feels like there is a cord in her arm, and that is where she had an IV placed a few days ago.  She states it is kind of tender, and swollen to the touch.  States is worse when she bends her arm.  Has no pain with supination pronation.  No Abbo-Pac condyle pain.  Will obtain x-rays, for further evaluation, believe that she likely needs an ultrasound of the upper extremity, to rule out a DVT, but likely think that it represents a superficial thrombophlebitis.  There is no evidence of erythema, or warmth to suggest anything infectious  Amount and/or Complexity of Data Reviewed Radiology: ordered.    Details: X-rays unremarkable Discussion of management or test interpretation with external provider(s): With patient, x-rays are reassuring.  Ultrasound unavailable at this time, given that it is nighttime, sent ultrasound outpatient, for her to follow-up in the a.m.  She is agreeable with this.  We discussed emergent return precautions such as shortness of breath, loss of sensation in the arm, no pulse.  She voiced understanding.  Does not appear infectious, but likely is representing a superficial clot, from the IV placement.  Ultrasound outpatient to rule out DVT  Risk OTC drugs.    Final Clinical Impression(s) / ED Diagnoses Final diagnoses:  Right arm pain    Rx / DC Orders ED Discharge Orders          Ordered    UE Venous Duplex       Comments: MPORTANT PATIENT INSTRUCTIONS:  You have been scheduled for an Outpatient Vascular Study at [facility].    If tomorrow is a Saturday, Sunday or holiday, please go to the [facility] Emergency Department  Registration Desk at 11 am  tomorrow morning and tell them you are there for a vascular study.   If tomorrow is a weekday (Monday-Friday), please go to [facility] Registration at 11 am and tell them you are there for a vascular study.   04/29/23 0041              Pete Pelt, PA 04/29/23 1610    Nira Conn, MD 04/29/23 [phone]

## 2023-04-29 NOTE — Progress Notes (Signed)
Upper extremity venous duplex completed. Please see CV Procedures for preliminary results.  Shona Simpson, RVT 04/29/23 11:30 AM

## 2023-05-02 ENCOUNTER — Other Ambulatory Visit: Payer: Self-pay | Admitting: Family Medicine

## 2023-05-02 MED ORDER — DOXYCYCLINE HYCLATE 100 MG PO TABS: 100.0000 mg | ORAL_TABLET | Freq: Two times a day (BID) | ORAL | 0 refills | Status: DC

## 2023-05-03 ENCOUNTER — Other Ambulatory Visit: Payer: Self-pay | Admitting: Family Medicine

## 2023-05-03 DIAGNOSIS — N898 Other specified noninflammatory disorders of vagina: Secondary | ICD-10-CM

## 2023-05-03 MED ORDER — FLUCONAZOLE 150 MG PO TABS
150.0000 mg | ORAL_TABLET | Freq: Every day | ORAL | 1 refills | Status: DC
Start: 2023-05-03 — End: 2023-05-24

## 2023-05-13 ENCOUNTER — Encounter: Payer: Self-pay | Admitting: Family Medicine

## 2023-05-13 ENCOUNTER — Other Ambulatory Visit: Payer: Self-pay

## 2023-05-16 ENCOUNTER — Other Ambulatory Visit: Payer: Self-pay

## 2023-05-16 ENCOUNTER — Other Ambulatory Visit: Payer: Self-pay | Admitting: Family Medicine

## 2023-05-16 MED ORDER — CLOPIDOGREL BISULFATE 75 MG PO TABS: 75.0000 mg | ORAL_TABLET | Freq: Every day | ORAL | 1 refills | Status: DC

## 2023-05-17 ENCOUNTER — Other Ambulatory Visit (HOSPITAL_COMMUNITY): Payer: Self-pay

## 2023-05-18 ENCOUNTER — Other Ambulatory Visit: Payer: Self-pay

## 2023-05-24 ENCOUNTER — Ambulatory Visit: Payer: Medicaid Other | Attending: Cardiology | Admitting: Cardiology

## 2023-05-24 ENCOUNTER — Encounter: Payer: Self-pay | Admitting: Cardiology

## 2023-05-24 VITALS — BP 128/76 | HR 98 | Ht 67.0 in | Wt 157.0 lb

## 2023-05-24 DIAGNOSIS — G459 Transient cerebral ischemic attack, unspecified: Secondary | ICD-10-CM

## 2023-05-24 DIAGNOSIS — I428 Other cardiomyopathies: Secondary | ICD-10-CM | POA: Diagnosis not present

## 2023-05-24 DIAGNOSIS — I5022 Chronic systolic (congestive) heart failure: Secondary | ICD-10-CM

## 2023-05-24 DIAGNOSIS — I499 Cardiac arrhythmia, unspecified: Secondary | ICD-10-CM | POA: Diagnosis not present

## 2023-05-24 DIAGNOSIS — I5032 Chronic diastolic (congestive) heart failure: Secondary | ICD-10-CM | POA: Diagnosis not present

## 2023-05-24 MED ORDER — SPIRONOLACTONE 25 MG PO TABS: 12.5000 mg | ORAL_TABLET | Freq: Every day | ORAL | 3 refills | Status: DC

## 2023-05-24 MED ORDER — DAPAGLIFLOZIN PROPANEDIOL 10 MG PO TABS: 10.0000 mg | ORAL_TABLET | Freq: Every day | ORAL | 3 refills | Status: DC

## 2023-05-24 NOTE — Patient Instructions (Signed)
Medication Instructions:  Your physician has recommended you make the following change in your medication:  START SPIRONOLACTONE 12.5 MG DAILY   *If you need a refill on your cardiac medications before your next appointment, please call your pharmacy*   Lab Work: TO BE DONE IN 1 WEEK: BMET If you have labs (blood work) drawn today and your tests are completely normal, you will receive your results only by: MyChart Message (if you have MyChart) OR A paper copy in the mail If you have any lab test that is abnormal or we need to change your treatment, we will call you to review the results.   Testing/Procedures: Your physician has requested that you have a carotid duplex. This test is an ultrasound of the carotid arteries in your neck. It looks at blood flow through these arteries that supply the brain with blood. Allow one hour for this exam. There are no restrictions or special instructions.  Follow-Up: At [facility], you and your health needs are our priority.  As part of our continuing mission to provide you with exceptional heart care, we have created designated Provider Care Teams.  These Care Teams include your primary Cardiologist (physician) and Advanced Practice Providers (APPs -  Physician Assistants and Nurse Practitioners) who all work together to provide you with the care you need, when you need it.  We recommend signing up for the patient portal called "MyChart".  Sign up information is provided on this After Visit Summary.  MyChart is used to connect with patients for Virtual Visits (Telemedicine).  Patients are able to view lab/test results, encounter notes, upcoming appointments, etc.  Non-urgent messages can be sent to your provider as well.   To learn more about what you can do with MyChart, go to [email or web address].    Your next appointment:   2 month(s)  Provider:   Perlie Gold, [name]     Other Instructions YOU HAVE BEEN REFERRED TO SEE A CARDIAC  ELECTROPHYSIOLOGIST

## 2023-05-24 NOTE — Progress Notes (Signed)
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 She had a catheterization at that time that showed no obstructive coronary artery disease and a non-ischemic myovue in 2019. Her ejection fraction has continued to improve on repeat echocardiograms, with the last ejection fraction in October 2020 being 45-50%. She also has abnormal septal motion from chronic left bundle branch block.  Recently, the patient was admitted following presentation with right facial and arm numbness. An MRI showed small acute infarcts in the right cerebellum, right parietal cortex, and left postcentral gyrus, with remote small infarcts seen along the cerebral cortex and cerebellum as well. During her hospital stay, she did not demonstrate any arrhythmia or AFib. A 30-day heart monitor was recommended and worn, but results are not yet available.  Since leaving the hospital, the patient reports feeling a little off balance and more forgetful. She also describes a sensation of something "vibrating inside her chest", which has occurred three or four times since her hospital discharge. This sensation unfortunately started after she returned the heart monitor. She reports <5 minute duration and no obvious provocation.   The patient also reports occasional shortness of breath, particularly when she "does too much." This symptom is about the same as before her hospital admission. She also notes that a section of her right arm remains numb, which she believes may be residual effects from her strokes.  The patient is currently taking Plavix, Marcelline Deist  (started during recent hospitalization), metoprolol, and Crestor. Cholesterol is excellent, with an LDL of 43. She also quit smoking the day she was admitted to the hospital for her stroke. Since quitting, she has noticed improvements in her sense of smell and taste, and less dryness in her mouth. She has also been coughing a bit and spitting up a small amount of light yellow, clear sputum.  The patient has also noticed some swelling in her legs, particularly in her right foot, which has been more pronounced since her hospital stay. She also notes that some days she wakes up with a flat stomach, while other days her stomach is swollen. She has not noticed any correlation between these fluctuations and her medication intake. Overall weight stable with admission.    Studies Reviewed:    EKG:   EKG Interpretation Date/Time:  Tuesday May 24 2023 11:36:46 EST Ventricular Rate:  89 PR Interval:  160 QRS Duration:  130 QT Interval:  432 QTC Calculation: 525 R Axis:   -8  Text Interpretation: Normal sinus rhythm Left bundle branch block When compared with ECG of 13-Apr-2023 00:30, PREVIOUS ECG IS PRESENT Confirmed by Perlie Gold [phone]) on 05/24/2023 11:38:56 AM    04/13/23 TTE  IMPRESSIONS     1. Septal movement consistent with LBBB. Left ventricular ejection  fraction, by estimation, is 45 to 50%. The left ventricle has mildly  decreased function. The left ventricle demonstrates global hypokinesis.  Indeterminate diastolic filling due to E-A  fusion.   2. Right ventricular systolic function is mildly reduced. The right  ventricular size is normal. There is normal pulmonary artery systolic  pressure. The estimated right ventricular systolic pressure is  12.5 mmHg.   3. The mitral valve is grossly normal. Trivial mitral valve  regurgitation. No evidence of mitral stenosis.   4. The aortic valve is tricuspid. There is mild calcification of the  aortic valve. Aortic valve  regurgitation is trivial. Aortic valve  sclerosis is present, with no evidence of aortic valve stenosis.   5. The inferior vena cava is normal in size with greater than 50%  respiratory variability, suggesting right atrial pressure of 3 mmHg.   Comparison(s): Changes from prior study are noted. The left ventricular  function is worsened.   FINDINGS   Left Ventricle: Septal movement consistent with LBBB. Left ventricular  ejection fraction, by estimation, is 45 to 50%. The left ventricle has  mildly decreased function. The left ventricle demonstrates global  hypokinesis. Definity contrast agent was  given IV to delineate the left ventricular endocardial borders. The left  ventricular internal cavity size was normal in size. There is no left  ventricular hypertrophy. Abnormal (paradoxical) septal motion, consistent  with left bundle branch block.  Indeterminate diastolic filling due to E-A fusion.   Right Ventricle: The right ventricular size is normal. No increase in  right ventricular wall thickness. Right ventricular systolic function is  mildly reduced. There is normal pulmonary artery systolic pressure. The  tricuspid regurgitant velocity is 1.54  m/s, and with an assumed right atrial pressure of 3 mmHg, the estimated  right ventricular systolic pressure is 12.5 mmHg.   Left Atrium: Left atrial size was normal in size.   Right Atrium: Right atrial size was normal in size.   Pericardium: Trivial pericardial effusion is present.   Mitral Valve: The mitral valve is grossly normal. Trivial mitral valve  regurgitation. No evidence of mitral valve stenosis.   Tricuspid Valve: The tricuspid valve is grossly normal. Tricuspid valve  regurgitation is trivial. No evidence of tricuspid stenosis.   Aortic Valve: The aortic valve is tricuspid. There is mild calcification  of the aortic valve. Aortic valve regurgitation is trivial. Aortic valve  sclerosis is present, with no evidence of  aortic valve stenosis. Aortic  valve peak gradient measures 7.0  mmHg.   Pulmonic Valve: The pulmonic valve was grossly normal. Pulmonic valve  regurgitation is not visualized. No evidence of pulmonic stenosis.   Aorta: The aortic root and ascending aorta are structurally normal, with  no evidence of dilitation.   Venous: The inferior vena cava is normal in size with greater than 50%  respiratory variability, suggesting right atrial pressure of 3 mmHg.   IAS/Shunts: The atrial septum is grossly normal.   Risk Assessment/Calculations:              Physical Exam:   VS:  BP 128/76   Pulse 98   Ht 5\' 7"  (1.702 m)   Wt 157 lb (71.2 kg)   LMP 06/09/2011 Comment: verified BEFORE imaging  SpO2 96%   BMI 24.59 kg/m    Wt Readings from Last 3 Encounters:  05/24/23 157 lb (71.2 kg)  04/25/23 152 lb (68.9 kg)  04/12/23 150 lb (68 kg)     Physical Exam Vitals reviewed.  Constitutional:      Appearance: Normal appearance.  HENT:     Head: Normocephalic.     Nose: Nose normal.  Eyes:     Pupils: Pupils are equal, round, and reactive to light.  Cardiovascular:     Rate and Rhythm: Normal rate and regular rhythm.     Pulses: Normal pulses.  Heart sounds: Normal heart sounds. No murmur heard.    No friction rub. No gallop.  Pulmonary:     Effort: Pulmonary effort is normal.     Breath sounds: Normal breath sounds.  Musculoskeletal:     Right lower leg: No edema.     Left lower leg: No edema.  Skin:    General: Skin is warm and dry.     Capillary Refill: Capillary refill takes less than 2 seconds.  Neurological:     General: No focal deficit present.     Mental Status: She is alert and oriented to person, place, and time.  Psychiatric:        Mood and Affect: Mood normal.        Behavior: Behavior normal.        Thought Content: Thought content normal.        Judgment: Judgment normal.    ASSESSMENT AND PLAN:     Assessment and Plan    Stroke Recent admission  for right facial and arm numbness. 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 No arrhythmia or AFib detected during admission. Suspicion of paroxysmal atrial fibrillation contributing to strokes. Symptoms include intermittent chest vibrations lasting 3-5 minutes, occurring post-monitoring period. Continued on Plavix as per neurology. Discussed risks and benefits of loop recorder implantation, including continuous monitoring for 3 years and potential detection of arrhythmias. Explained that the loop recorder is implanted under the skin and can be done in-office. EKG today with NSR, stable LBBB - Submit referral to electrophysiology team for loop recorder implantation - Order carotid duplex ultrasound - Continue Plavix daily - Continue statin  Heart Failure with Reduced Ejection Fraction (HFrEF) DCM with ejection fraction as low as 20-25% in 2016, improved to normal per 2023 echo, now 45-50% in October 2024. No chest pain, well-managed shortness of breath. On metoprolol succinate 25 mg and Farxiga 10 mg (Farxiga started during recent admission). Recent leg swelling, not significantly worse since hospital admission. Discussed starting spironolactone to help with swelling, including potential side effects and need for close monitoring of kidney function and potassium levels. Patient agreed to start medication. - Start spironolactone 12.5 mg - Check metabolic panel next week. - Continue metoprolol succinate 25 mg daily - Continue Farxiga 10 mg daily, Refill  - Unable to start ACEi/ARB/ARNI due to angioedema reaction to ACEi  Hypertension Blood pressure well-controlled with current medications. Recent readings around 120s/70s. - Continue current antihypertensive regimen/HF meds as above.  Diabetes Mellitus Emphasis on lifestyle modifications and medication adherence. - Continue current diabetes management plan  Chronic Obstructive  Pulmonary Disease (COPD) Recently quit smoking. Reports improved sense of smell and taste, occasional light yellow sputum. Encouraged to avoid secondhand smoke. - Continue to avoid smoking and secondhand smoke  General Health Maintenance Cholesterol levels excellent with LDL at 43. On Crestor 20 mg. Kidney function good. Emphasis on lifestyle modifications including diet and exercise. - Continue Crestor 20 mg daily - Encourage healthy diet and regular exercise  Follow-up - Order EKG today - Follow up in a couple of months to discuss monitor results and overall health status.             Signed, Perlie Gold, [name]

## 2023-05-26 ENCOUNTER — Other Ambulatory Visit: Payer: Self-pay | Admitting: Family Medicine

## 2023-05-27 ENCOUNTER — Other Ambulatory Visit: Payer: Self-pay | Admitting: Internal Medicine

## 2023-05-30 ENCOUNTER — Other Ambulatory Visit: Payer: Self-pay

## 2023-05-30 MED ORDER — CLOPIDOGREL BISULFATE 75 MG PO TABS: 75.0000 mg | ORAL_TABLET | Freq: Every day | ORAL | 1 refills | Status: DC

## 2023-06-01 ENCOUNTER — Ambulatory Visit: Payer: Medicaid Other | Attending: Cardiology

## 2023-06-01 DIAGNOSIS — I447 Left bundle-branch block, unspecified: Secondary | ICD-10-CM

## 2023-06-01 DIAGNOSIS — I428 Other cardiomyopathies: Secondary | ICD-10-CM

## 2023-06-01 DIAGNOSIS — R002 Palpitations: Secondary | ICD-10-CM

## 2023-06-01 DIAGNOSIS — I5032 Chronic diastolic (congestive) heart failure: Secondary | ICD-10-CM

## 2023-06-01 DIAGNOSIS — G459 Transient cerebral ischemic attack, unspecified: Secondary | ICD-10-CM

## 2023-06-02 ENCOUNTER — Other Ambulatory Visit: Payer: Self-pay | Admitting: Family Medicine

## 2023-06-02 ENCOUNTER — Telehealth: Payer: Self-pay

## 2023-06-02 DIAGNOSIS — F419 Anxiety disorder, unspecified: Secondary | ICD-10-CM

## 2023-06-02 MED ORDER — DEXCOM G6 TRANSMITTER MISC: 0 refills | Status: DC

## 2023-06-02 MED ORDER — DEXCOM G6 SENSOR MISC: 0 refills | Status: DC

## 2023-06-02 NOTE — Telephone Encounter (Signed)
Contact patient to schedule follow up

## 2023-06-02 NOTE — Telephone Encounter (Signed)
Patient is scheduled for 07/27/2023 with Dr. Lonzo Cloud.

## 2023-06-03 ENCOUNTER — Telehealth: Payer: Self-pay

## 2023-06-03 NOTE — Telephone Encounter (Signed)
Dexcom supplies need PA.  

## 2023-06-04 LAB — BASIC METABOLIC PANEL
BUN/Creatinine Ratio: 13 (ref 12–28)
BUN: 9 mg/dL (ref 8–27)
CO2: 22 mmol/L (ref 20–29)
Calcium: 9.1 mg/dL (ref 8.7–10.3)
Chloride: 105 mmol/L (ref 96–106)
Creatinine, Ser: 0.72 mg/dL (ref 0.57–1.00)
Glucose: 187 mg/dL — ABNORMAL HIGH (ref 70–99)
Potassium: 4.2 mmol/L (ref 3.5–5.2)
Sodium: 142 mmol/L (ref 134–144)
eGFR: 95 mL/min/{1.73_m2} (ref >59)

## 2023-06-06 ENCOUNTER — Other Ambulatory Visit (HOSPITAL_COMMUNITY): Payer: Self-pay

## 2023-06-06 ENCOUNTER — Telehealth: Payer: Self-pay

## 2023-06-06 NOTE — Telephone Encounter (Signed)
Pharmacy Patient Advocate Encounter   Received notification from Pt Calls Messages that prior authorization for Dexcom G6 sensor is required/requested.   Insurance verification completed.   The patient is insured through [facility] .   Per PA form:   It does not appear that pt has been seen in the last 3 months. Pt will need an office visit before PA can be finished.

## 2023-06-06 NOTE — Telephone Encounter (Signed)
Patient picked up 3 boxes of Dexcom 7

## 2023-06-07 ENCOUNTER — Other Ambulatory Visit: Payer: Self-pay | Admitting: Family Medicine

## 2023-06-07 NOTE — Telephone Encounter (Signed)
Can we place patient on wait list for sooner appt

## 2023-06-08 ENCOUNTER — Encounter: Payer: Self-pay | Admitting: Emergency Medicine

## 2023-06-08 ENCOUNTER — Other Ambulatory Visit: Payer: Self-pay

## 2023-06-08 ENCOUNTER — Ambulatory Visit: Payer: Medicaid Other | Admitting: Emergency Medicine

## 2023-06-08 VITALS — BP 124/74 | HR 96 | Temp 98.0°F | Ht 67.0 in | Wt 156.0 lb

## 2023-06-08 DIAGNOSIS — J410 Simple chronic bronchitis: Secondary | ICD-10-CM

## 2023-06-08 DIAGNOSIS — Z720 Tobacco use: Secondary | ICD-10-CM

## 2023-06-08 DIAGNOSIS — J449 Chronic obstructive pulmonary disease, unspecified: Secondary | ICD-10-CM

## 2023-06-08 LAB — PULMONARY FUNCTION TEST
DL/VA % pred: 106 %
DL/VA: 4.29 ml/min/mmHg/L
DLCO cor % pred: 76 %
DLCO cor: 18.8 ml/min/mmHg
DLCO unc % pred: 76 %
DLCO unc: 18.8 ml/min/mmHg
FEF 25-75 Post: 2.14 L/s
FEF 25-75 Pre: 2.26 L/s
FEF2575-%Change-Post: -5 %
FEF2575-%Pred-Post: 80 %
FEF2575-%Pred-Pre: 84 %
FEV1-%Change-Post: -1 %
FEV1-%Pred-Post: 76 %
FEV1-%Pred-Pre: 77 %
FEV1-Post: 2.39 L
FEV1-Pre: 2.42 L
FEV1FVC-%Change-Post: -5 %
FEV1FVC-%Pred-Pre: 101 %
FEV6-%Change-Post: 2 %
FEV6-%Pred-Post: 80 %
FEV6-%Pred-Pre: 78 %
FEV6-Post: 3.15 L
FEV6-Pre: 3.06 L
FEV6FVC-%Change-Post: -1 %
FEV6FVC-%Pred-Post: 102 %
FEV6FVC-%Pred-Pre: 103 %
FVC-%Change-Post: 4 %
FVC-%Pred-Post: 78 %
FVC-%Pred-Pre: 75 %
FVC-Post: 3.19 L
FVC-Pre: 3.06 L
Post FEV1/FVC ratio: 75 %
Post FEV6/FVC ratio: 99 %
Pre FEV1/FVC ratio: 79 %
Pre FEV6/FVC Ratio: 100 %
RV % pred: 100 %
RV: 2.32 L
TLC % pred: 93 %
TLC: 5.57 L

## 2023-06-08 MED ORDER — METOPROLOL SUCCINATE ER 25 MG PO TB24
25.0000 mg | ORAL_TABLET | Freq: Every day | ORAL | 1 refills | Status: DC
  Filled 2023-06-08: qty 90, 90d supply, fill #0

## 2023-06-08 NOTE — Progress Notes (Signed)
Full PFT performed today. °

## 2023-06-08 NOTE — Patient Instructions (Addendum)
We reviewed your pulmonary function testing today.  These are overall stable compared with 2015. We will continue Spiriva 2 puffs once daily. Keep your albuterol available to use 2 puffs when needed for shortness of breath, chest tightness, wheezing. Congratulations on decreasing your cigarettes.  Please continue to work on this.  Our ultimate goal is for you to stop altogether We will get you back in the lung cancer screening program.  Your next CT scan of the chest should be in August 2025 Follow Dr. Delton Coombes in August 2025 or sooner if you have any problems.

## 2023-06-08 NOTE — Assessment & Plan Note (Signed)
We reviewed your pulmonary function testing today.  These are overall stable compared with 2015. We will continue Spiriva 2 puffs once daily. Keep your albuterol available to use 2 puffs when needed for shortness of breath, chest tightness, wheezing. Follow Dr. Delton Coombes in August 2025 or sooner if you have any problems.

## 2023-06-08 NOTE — Progress Notes (Signed)
Subjective:    Patient ID: [name], female    DOB: 08/05/1960, 62 y.o.   MRN: [id number]  HPI  ROV 03/10/2023 --follow-up visit for 62 year old woman with history of active tobacco and vape use, mixed obstruction and restriction with a positive bronchodilator response on PFT (2015).  Also with a history of diabetes, hypertension, A flutter, chronic systolic CHF.  I saw her in July after her lung cancer screening CT 10/2022 showed a new focal endobronchial density in the posterior medial right middle lobe compared with 02/2021.  There was also a new tiny 3 mm anterior right lower lobe nodule seen.  Based on this we planned to repeat her CT chest in August as below.  We restarted Spiriva at her last visit to see if she would tolerate.  She reports that she believes that it has helped her breathing overall. She has cut her cig down to 10/day. She doesn't cough often - spiriva has helped this. She is having some vertigo / dizziness.   Super D CT chest 02/23/2023 reviewed by me, shows that the right middle lobe area of endobronchial density has resolved and likely represented mucus impaction.  There is a left lower lobe calcified granuloma and an anterior right lower lobe 3 mm nodule that is unchanged.    ROV 06/08/2023 --62 year old woman with a history of tobacco use, vape use, COPD with asthmatic features.  I seen her for her COPD as well as a prior right middle lobe nodule that resolved on imaging consistent with a mucoid impaction.  Our plan was to get her back into the lung cancer screening program beginning in August 2025.  She underwent pulmonary function testing today. She was admitted with CVA since I last saw her, now on plavix, farxiga. She is on spiriva, using albuterol 1-2x a week. She is able to exert, although she does get some SOB and fatigue including walking a long distance. She still smokes 3-4 a day.   Pulmonary function testing reviewed by me performed today shows mixed  obstruction and restriction with an FEV1 of 2.42 L or 77% predicted (stable compared with 10/04/2013).  No bronchodilator response.  Normal lung volumes.  Decreased diffusion capacity that corrects to the normal range when adjusted for valvular volume.  Review of Systems As per HPI  Past Medical History:  Diagnosis Date   Anxiety    Arthritis    Chronic systolic heart failure (HCC)    EF 20-25% ECHO 05/2015   COPD (chronic obstructive pulmonary disease) (HCC)    Depression    Diabetes mellitus without complication (HCC)    Type II   Dysrhythmia    pt unsure of name of arrythmia - " my heart rate will drop all of a sudden" -no current treatment - atrial flutter   Fibrillation, atrial (HCC)    Gallstones    GERD (gastroesophageal reflux disease)    Headache(784.0)    migraines   Hypertension    Osteomyelitis (HCC)    R ankle   Rash    arms     Family History  Problem Relation Age of Onset   Diabetes Mother    Lung cancer Father    Heart attack Father 72       CABG x4   Breast cancer Sister        Survivor     Son >> sarcoidosis  Social History   Socioeconomic History   Marital status: Divorced    Spouse name: Not  on file   Number of children: Not on file   Years of education: Not on file   Highest education level: Not on file  Occupational History   Not on file  Tobacco Use   Smoking status: Every Day    Current packs/day: 1.00    Average packs/day: 1 pack/day for 20.4 years (20.4 ttl pk-yrs)    Types: Cigarettes, E-cigarettes    Start date: 09/19/2016   Smokeless tobacco: Never   Tobacco comments:    Pt stated smoking 5 cigarettes daily  updated 06/08/2023 amy marsh, cma  Vaping Use   Vaping status: Every Day   Start date: 10/12/2016  Substance and Sexual Activity   Alcohol use: No    Alcohol/week: 0.0 standard drinks of alcohol   Drug use: No   Sexual activity: Not Currently    Birth control/protection: Post-menopausal  Other Topics Concern   Not on  file  Social History Narrative   ** Merged History Encounter **    Right handed   Caffeine- 1 cup daily   Social Determinants of Health   Financial Resource Strain: Medium Risk (04/25/2023)   Overall Financial Resource Strain (CARDIA)    Difficulty of Paying Living Expenses: Somewhat hard  Food Insecurity: Food Insecurity Present (04/25/2023)   Hunger Vital Sign    Worried About Running Out of Food in the Last Year: Sometimes true    Ran Out of Food in the Last Year: Sometimes true  Transportation Needs: No Transportation Needs (04/14/2023)   PRAPARE - Administrator, Civil Service (Medical): No    Lack of Transportation (Non-Medical): No  Physical Activity: Insufficiently Active (04/25/2023)   Exercise Vital Sign    Days of Exercise per Week: 3 days    Minutes of Exercise per Session: 10 min  Stress: No Stress Concern Present (04/25/2023)   [employer] of Occupational Health - Occupational Stress Questionnaire    Feeling of Stress : Not at all  Social Connections: Socially Isolated (04/25/2023)   Social Connection and Isolation Panel [NHANES]    Frequency of Communication with Friends and Family: More than three times a week    Frequency of Social Gatherings with Friends and Family: More than three times a week    Attends Religious Services: Never    Database administrator or Organizations: No    Attends Banker Meetings: Never    Marital Status: Divorced  Catering manager Violence: Not At Risk (04/25/2023)   Humiliation, Afraid, Rape, and Kick questionnaire    Fear of Current or Ex-Partner: No    Emotionally Abused: No    Physically Abused: No    Sexually Abused: No     Allergies  Allergen Reactions   Biaxin [Clarithromycin] Shortness Of Breath and Swelling   Lisinopril Swelling and Cough    Lip edema   Sulfa Antibiotics Anaphylaxis, Shortness Of Breath, Swelling and Hypertension   Chlorthalidone Nausea And Vomiting and Other (See  Comments)    Clammy, Tachycardia, Headache    Daptomycin Nausea And Vomiting   Latex Hives and Rash   Amoxicillin-Pot Clavulanate Diarrhea    Has patient had a PCN reaction causing immediate rash, facial/tongue/throat swelling, SOB or lightheadedness with hypotension:No Has patient had a PCN reaction causing severe rash involving mucus membranes or skin necrosis:No Has patient had a PCN reaction that required hospitalization:No Has patient had a PCN reaction occurring within THE LAST 10 YEARS.  #  #  #  YES  #  #  #  If all of the above answers are "NO", then may proceed with Cephalosporin use.    Aspirin Nausea And Vomiting and Rash    On 325mg  dosage   Cholestyramine Nausea And Vomiting   Dilaudid [Hydromorphone Hcl] Nausea And Vomiting   Lasix [Furosemide] Nausea And Vomiting and Other (See Comments)    Headache      Outpatient Medications Prior to Visit  Medication Sig Dispense Refill   ACCU-CHEK GUIDE test strip USE AS DIRECTED IN THE MORNING, AT NOON, IN THE EVENING AND AT BEDTIME 400 strip 3   Accu-Chek Softclix Lancets lancets Use to check blood sugar THREE TIMES DAILY 100 each 2   albuterol (VENTOLIN HFA) 108 (90 Base) MCG/ACT inhaler Inhale 2 puffs into the lungs every 4 (four) hours as needed for wheezing or shortness of breath. 1 each 0   amitriptyline (ELAVIL) 50 MG tablet Take 1 tablet (50 mg total) by mouth at bedtime. 90 tablet 3   BD PEN NEEDLE NANO 2ND GEN 32G X 4 MM MISC USE 1 PEN NEEDLE UNDER THE SKIN FOUR TIMES DAILY 400 each 2   Blood Glucose Monitoring Suppl (ACCU-CHEK GUIDE ME) w/Device KIT Use to check blood sugar TID. 1 kit 0   cetirizine (ZYRTEC) 10 MG tablet TAKE 1 TABLET(10 MG) BY MOUTH DAILY 90 tablet 0   clopidogrel (PLAVIX) 75 MG tablet Take 1 tablet (75 mg total) by mouth daily. 90 tablet 1   Continuous Glucose Sensor (DEXCOM G6 SENSOR) MISC USE 1 DEVICE AS DIRECTED 9 each 0   Continuous Glucose Transmitter (DEXCOM G6 TRANSMITTER) MISC Change every 90  days 1 each 0   dapagliflozin propanediol (FARXIGA) 10 MG TABS tablet Take 1 tablet (10 mg total) by mouth daily before breakfast. 90 tablet 3   dicyclomine (BENTYL) 10 MG capsule TAKE ONE CAPSULE BY MOUTH THREE TIMES DAILY BEFORE MEALS (Patient taking differently: Take 10 mg by mouth 2 (two) times daily before a meal.) 270 capsule 0   diphenoxylate-atropine (LOMOTIL) 2.5-0.025 MG tablet TAKE ONE TABLET BY MOUTH TWICE DAILY 60 tablet 3   doxycycline (VIBRA-TABS) 100 MG tablet Take 1 tablet (100 mg total) by mouth 2 (two) times daily. 20 tablet 0   fenofibrate (TRICOR) 145 MG tablet TAKE ONE TABLET BY MOUTH EVERY DAY 90 tablet 0   FLUoxetine (PROZAC) 40 MG capsule TAKE ONE CAPSULE BY MOUTH AT BEDTIME 90 capsule 1   hydrOXYzine (ATARAX) 10 MG tablet TAKE ONE TABLET BY MOUTH THREE TIMES DAILY AS NEEDED 90 tablet 2   Insulin Aspart FlexPen (NOVOLOG) 100 UNIT/ML INJECT A MAXIMUM OF 50 UNITS UNDER THE SKIN DAILY AS DIRECTED PER CORRECTION SCALE 15 mL 0   Insulin Disposable Pump (OMNIPOD 5 G6 PODS, GEN 5,) MISC USE 1 DEVICE EVERY 3 DAYS 30 each 3   insulin glargine (LANTUS SOLOSTAR) 100 UNIT/ML Solostar Pen Inject 30 Units into the skin at bedtime.     Lancet Devices (ACCU-CHEK SOFTCLIX) lancets Use as instructed daily. 1 each 5   meclizine (ANTIVERT) 25 MG tablet Take 1 tablet (25 mg total) by mouth 3 (three) times daily as needed for dizziness. (Patient taking differently: Take 25 mg by mouth 2 (two) times daily.) 90 tablet 1   metoprolol succinate (TOPROL-XL) 25 MG 24 hr tablet Take 1 tablet (25 mg total) by mouth daily. 90 tablet 1   mupirocin ointment (BACTROBAN) 2 % Apply 1 Application topically 2 (two) times daily. (Patient taking differently: Apply 1 Application topically daily as needed (for folliculitis).) 22  g 1   omeprazole (PRILOSEC) 40 MG capsule Take 1 capsule (40 mg total) by mouth daily. 90 capsule 1   pregabalin (LYRICA) 150 MG capsule TAKE ONE CAPSULE BY MOUTH TWICE DAILY 60 capsule 5    promethazine (PHENERGAN) 25 MG tablet 1/2-1 tab every 4 to 6 hours prn nausea 20 tablet 0   rosuvastatin (CRESTOR) 20 MG tablet Take 1 tablet (20 mg total) by mouth daily. 30 tablet 1   spironolactone (ALDACTONE) 25 MG tablet Take 0.5 tablets (12.5 mg total) by mouth daily. 45 tablet 3   Tiotropium Bromide Monohydrate (SPIRIVA RESPIMAT) 1.25 MCG/ACT AERS Inhale 2 puffs into the lungs daily. 4 g 5   Facility-Administered Medications Prior to Visit  Medication Dose Route Frequency Provider Last Rate Last Admin   regadenoson (LEXISCAN) injection SOLN 0.4 mg  0.4 mg Intravenous Once Lars Masson, MD       technetium tetrofosmin (TC-MYOVIEW) injection 32.8 millicurie  32.8 millicurie Intravenous Once PRN Lars Masson, MD            Objective:   Physical Exam  Vitals:   06/08/23 1353  BP: 124/74  Pulse: 96  Temp: 98 F (36.7 C)  TempSrc: Oral  SpO2: 97%  Weight: 156 lb (70.8 kg)  Height: 5\' 7"  (1.702 m)    Gen: Pleasant, well-nourished, in no distress,  normal affect  ENT: No lesions,  mouth clear,  oropharynx clear, no postnasal drip  Neck: No JVD, no stridor  Lungs: No use of accessory muscles, clear bilaterally  Cardiovascular: RRR, heart sounds normal, no murmur or gallops, no peripheral edema  Musculoskeletal: No deformities, no cyanosis or clubbing  Neuro: alert, awake, non focal  Skin: Warm, scattered rash on back, face, arms with some evidence of excoriation      Assessment & Plan:  COPD (chronic obstructive pulmonary disease) (HCC) We reviewed your pulmonary function testing today.  These are overall stable compared with 2015. We will continue Spiriva 2 puffs once daily. Keep your albuterol available to use 2 puffs when needed for shortness of breath, chest tightness, wheezing. Follow Dr. Delton Coombes in August 2025 or sooner if you have any problems.  Tobacco abuse Congratulations on decreasing your cigarettes.  Please continue to work on this.  Our  ultimate goal is for you to stop altogether We will get you back in the lung cancer screening program.  Your next CT scan of the chest should be in August 2025     Levy Pupa, MD, PhD 06/08/2023, 2:11 PM [facility] [phone] or if no answer before 7:00PM call [phone] For any issues after 7:00PM please call eLink [phone]

## 2023-06-08 NOTE — Patient Instructions (Signed)
Full PFT performed today. °

## 2023-06-08 NOTE — Assessment & Plan Note (Signed)
Congratulations on decreasing your cigarettes.  Please continue to work on this.  Our ultimate goal is for you to stop altogether We will get you back in the lung cancer screening program.  Your next CT scan of the chest should be in August 2025

## 2023-06-09 ENCOUNTER — Ambulatory Visit: Payer: Medicaid Other | Admitting: Nurse Practitioner

## 2023-06-13 ENCOUNTER — Ambulatory Visit (HOSPITAL_COMMUNITY)
Admission: RE | Admit: 2023-06-13 | Payer: Medicaid Other | Source: Ambulatory Visit | Attending: Cardiology | Admitting: Cardiology

## 2023-06-20 ENCOUNTER — Other Ambulatory Visit: Payer: Self-pay

## 2023-06-21 ENCOUNTER — Ambulatory Visit (HOSPITAL_COMMUNITY): Admission: RE | Admit: 2023-06-21 | Payer: Medicaid Other | Source: Ambulatory Visit

## 2023-06-22 ENCOUNTER — Other Ambulatory Visit: Payer: Self-pay | Admitting: Emergency Medicine

## 2023-06-24 ENCOUNTER — Ambulatory Visit: Payer: Medicaid Other | Attending: Cardiology | Admitting: Cardiology

## 2023-06-24 ENCOUNTER — Encounter: Payer: Self-pay | Admitting: Cardiology

## 2023-06-24 VITALS — BP 112/68 | HR 75 | Ht 67.0 in | Wt 157.0 lb

## 2023-06-24 DIAGNOSIS — I447 Left bundle-branch block, unspecified: Secondary | ICD-10-CM

## 2023-06-24 DIAGNOSIS — R002 Palpitations: Secondary | ICD-10-CM

## 2023-06-24 DIAGNOSIS — I639 Cerebral infarction, unspecified: Secondary | ICD-10-CM

## 2023-06-24 NOTE — Patient Instructions (Addendum)
 Medication Instructions:  Your physician recommends that you continue on your current medications as directed. Please refer to the Current Medication list given to you today.  Labwork: None ordered.  Testing/Procedures: None ordered.  Follow-Up: As needed with Dr. Jimmey Ralph  Implantable Loop Recorder Placement, Care After This sheet gives you information about how to care for yourself after your procedure. Your health care provider may also give you more specific instructions. If you have problems or questions, contact your health care provider. What can I expect after the procedure? After the procedure, it is common to have: Soreness or discomfort near the incision. Some swelling or bruising near the incision.  Follow these instructions at home: Incision care  Monitor your cardiac device site for redness, swelling, and drainage. Call the device clinic at [phone] if you experience these symptoms or fever/chills.  Keep the large square bandage on your site for 24 hours and then you may remove it yourself. Keep the steri-strips underneath in place.   You may shower after 72 hours / 3 days from your procedure with the steri-strips in place. They will usually fall off on their own, or may be removed after 10 days. Pat dry.   Avoid lotions, ointments, or perfumes over your incision until it is well-healed.  Please do not submerge in water until your site is completely healed.   Your device is MRI compatible.   Remote monitoring is used to monitor your cardiac device from home. This monitoring is scheduled every month by our office. It allows [place] to keep an eye on the function of your device to ensure it is working properly.  If your wound site starts to bleed apply pressure.       If you have any questions/concerns please call the device clinic at [phone].  Activity  Return to your normal activities.  General instructions Follow instructions from your health care  provider about how to manage your implantable loop recorder and transmit the information. Learn how to activate a recording if this is necessary for your type of device. You may go through a metal detection gate, and you may let someone hold a metal detector over your chest. Show your ID card if needed. Do not have an MRI unless you check with your health care provider first. Take over-the-counter and prescription medicines only as told by your health care provider. Keep all follow-up visits as told by your health care provider. This is important. Contact a health care provider if: You have redness, swelling, or pain around your incision. You have a fever. You have pain that is not relieved by your pain medicine. You have triggered your device because of fainting (syncope) or because of a heartbeat that feels like it is racing, slow, fluttering, or skipping (palpitations). Get help right away if you have: Chest pain. Difficulty breathing. Summary After the procedure, it is common to have soreness or discomfort near the incision. Change your dressing as told by your health care provider. Follow instructions from your health care provider about how to manage your implantable loop recorder and transmit the information. Keep all follow-up visits as told by your health care provider. This is important. This information is not intended to replace advice given to you by your health care provider. Make sure you discuss any questions you have with your health care provider. Document Released: 05/26/2015 Document Revised: 07/30/2017 Document Reviewed: 07/30/2017 Elsevier Patient Education  2020 [employer].

## 2023-06-24 NOTE — Progress Notes (Signed)
Electrophysiology Office Note:   Date:  06/24/2023  ID:  Sula Tibbetts, DOB 12/22/1960, MRN [id number]  Primary Cardiologist: Kristeen Miss, MD Primary Heart Failure: None Electrophysiologist: Nobie Putnam, MD      History of Present Illness:   [name] is a 62 y.o. female with h/o smoking, COPD, hypertension, diabetes, LBBB and chronic systolic heart failure with recovered LVEF (20-25% in 2016, now 45-50%) secondary to non-ischemic cardiomyopathy who is being seen today for evaluation for loop recorder implant at the request of Perlie Gold, PA.   Patient presented to the hospital with right facial and arm numbness on 04/12/23. Imaging was consistent with small acute infarcts in the right cerebellum, right parietal cortex, and left postcentral gyrus, which were suspected to be cardioembolic in nature. She denies any known history of atrial fibrillation. Occasional palpitations. Otherwise doing well. No new or acute complaints.   Review of systems complete and found to be negative unless listed in HPI.   EP Information / Studies Reviewed:    EKG is ordered today. Personal review as below.  EKG Interpretation Date/Time:  Friday June 24 2023 11:58:29 EST Ventricular Rate:  75 PR Interval:  154 QRS Duration:  134 QT Interval:  452 QTC Calculation: 504 R Axis:   -20  Text Interpretation: Normal sinus rhythm Left bundle branch block When compared with ECG of 24-May-2023 11:36,no significant change. Confirmed by Nobie Putnam [phone]) on 06/24/2023 12:18:30 PM   Echo 04/13/23:  1. Septal movement consistent with LBBB. Left ventricular ejection  fraction, by estimation, is 45 to 50%. The left ventricle has mildly  decreased function. The left ventricle demonstrates global hypokinesis.  Indeterminate diastolic filling due to E-A  fusion.   2. Right ventricular systolic function is mildly reduced. The right  ventricular size is normal. There is normal pulmonary  artery systolic  pressure. The estimated right ventricular systolic pressure is 12.5 mmHg.   3. The mitral valve is grossly normal. Trivial mitral valve  regurgitation. No evidence of mitral stenosis.   4. The aortic valve is tricuspid. There is mild calcification of the  aortic valve. Aortic valve regurgitation is trivial. Aortic valve  sclerosis is present, with no evidence of aortic valve stenosis.   5. The inferior vena cava is normal in size with greater than 50%  respiratory variability, suggesting right atrial pressure of 3 mmHg    Physical Exam:   VS:  BP 112/68   Pulse 75   Ht 5\' 7"  (1.702 m)   Wt 157 lb (71.2 kg)   LMP 06/09/2011 Comment: verified BEFORE imaging  SpO2 90%   BMI 24.59 kg/m    Wt Readings from Last 3 Encounters:  06/24/23 157 lb (71.2 kg)  06/08/23 156 lb (70.8 kg)  05/24/23 157 lb (71.2 kg)     GEN: Well nourished, well developed in no acute distress NECK: No JVD CARDIAC: Normal rate, regular rhythm.  RESPIRATORY: Normal work of breathing ABDOMEN: Soft, non-tender, non-distended EXTREMITIES:  No edema; No deformity   ASSESSMENT AND PLAN:   [name] is a 62 y.o. female with h/o smoking, COPD, hypertension, diabetes, LBBB and chronic systolic heart failure with recovered LVEF (20-25% in 2016, now 45-50%) secondary to non-ischemic cardiomyopathy who is being seen today for evaluation for loop recorder implant at the request of Perlie Gold, PA.   Patient presented to the hospital with right facial and arm numbness on 04/12/23. Imaging was consistent with small acute infarcts in the right cerebellum, right  parietal cortex, and left postcentral gyrus, which were suspected to be cardioembolic in nature. She has no known history of atrial fibrillation.   #Cryptogenic stroke:  - Explained risks, benefits, and alternatives to ILR implantation, including but not limited to bleeding, infection, damage to heart or lungs, heart attack, stroke, or death.   Pt verbalized understanding and would like to proceed.  #LBBB: #Chronic systolic heart failure with recovered LVEF (20-25% in 2016, now 45-50%) secondary to non-ischemic cardiomyopathy  - Well compensated currently. Continue monitoring. If LV function worsens then consideration for CRT at that time.   SURGEON:  Nobie Putnam, MD     PREPROCEDURE DIAGNOSIS:  Cryptogenic stroke    POSTPROCEDURE DIAGNOSIS: Cryptogenic stroke     PROCEDURES:   1. Implantable loop recorder implantation    INTRODUCTION:  [name] presents with a history of cryptogenic stroke The costs of loop recorder monitoring have been discussed with the patient.    DESCRIPTION OF PROCEDURE:  Informed written consent was obtained.   Time Out Completed with RN    The patient required no sedation for the procedure today.  Mapping over the patient's chest was performed to identify the area where electrograms were most prominent for ILR recording.  This area was found to be the left parasternal region over the 4th intercostal space. The patients left chest was therefore prepped and draped in the usual sterile fashion. The skin overlying the left parasternal region was infiltrated with lidocaine for local analgesia.  A 0.5-cm incision was made over the left parasternal region over the 3rd intercostal space.  A subcutaneous ILR pocket was fashioned using a combination of sharp and blunt dissection.  A [employer] LUX implantable loop recorder (serial # [id number]) was then placed into the pocket  R waves were very prominent and measuring  0.20mV .  Steri- Strips and a sterile dressing were then applied.  There were no early apparent complications.     CONCLUSIONS:   1. Successful implantation of a implantable loop recorder for a history of cryptogenic stroke  2. No early apparent complications.    Total time of encounter: 60 minutes total time of encounter, including chart review, face-to-face patient care,  coordination of care and counseling regarding high complexity medical decision making.  Nobie Putnam, MD  Cardiac Electrophysiology

## 2023-06-28 ENCOUNTER — Ambulatory Visit (HOSPITAL_COMMUNITY)
Admission: RE | Admit: 2023-06-28 | Discharge: 2023-06-28 | Disposition: A | Discharge status: Home / Self Care | Payer: Medicaid Other | Source: Ambulatory Visit | Attending: Cardiovascular Disease | Admitting: Cardiovascular Disease

## 2023-06-28 DIAGNOSIS — I499 Cardiac arrhythmia, unspecified: Secondary | ICD-10-CM

## 2023-06-28 DIAGNOSIS — I428 Other cardiomyopathies: Secondary | ICD-10-CM | POA: Diagnosis not present

## 2023-06-28 DIAGNOSIS — I5032 Chronic diastolic (congestive) heart failure: Secondary | ICD-10-CM | POA: Diagnosis present

## 2023-06-28 DIAGNOSIS — G459 Transient cerebral ischemic attack, unspecified: Secondary | ICD-10-CM | POA: Insufficient documentation

## 2023-06-28 DIAGNOSIS — I5022 Chronic systolic (congestive) heart failure: Secondary | ICD-10-CM

## 2023-06-30 ENCOUNTER — Other Ambulatory Visit: Payer: Self-pay | Admitting: Internal Medicine

## 2023-06-30 ENCOUNTER — Encounter: Payer: Self-pay | Admitting: Family Medicine

## 2023-07-01 ENCOUNTER — Telehealth: Payer: Self-pay

## 2023-07-01 NOTE — Telephone Encounter (Signed)
 Called patient regarding recent mychart message, Sent all information through my for the schedule with our Mobile Unit.

## 2023-07-12 ENCOUNTER — Telehealth: Payer: Self-pay | Admitting: Cardiology

## 2023-07-12 NOTE — Telephone Encounter (Signed)
 Called patient who reports small bump that appeared near loop implant several days ago, ruptured 2 days ago with drainage. Patient reports tenderness to area/redness. Denies fever or chills. Device clinic apt made 07/13/23 @ 4:00 pm. Pt made aware of location, date and time. Pt advised to call if symptoms worsen.

## 2023-07-12 NOTE — Telephone Encounter (Signed)
 Pt states that she had loop placed and is now having left breast pain and slight swelling x 2 days and is requesting cb. No other symptoms.

## 2023-07-13 ENCOUNTER — Ambulatory Visit: Payer: Medicaid Other

## 2023-07-13 ENCOUNTER — Telehealth: Payer: Self-pay

## 2023-07-13 ENCOUNTER — Other Ambulatory Visit: Payer: Self-pay | Admitting: Internal Medicine

## 2023-07-13 NOTE — Telephone Encounter (Signed)
 New Message:     Patient needs to reschedule her wound check for 07-13-23 please.

## 2023-07-13 NOTE — Telephone Encounter (Signed)
 Device Clinic apt made 07/15/23 @ 10:00. Dr Carolynne Citron is DOD.

## 2023-07-15 ENCOUNTER — Ambulatory Visit: Payer: Medicaid Other

## 2023-07-21 ENCOUNTER — Other Ambulatory Visit: Payer: Self-pay | Admitting: Family Medicine

## 2023-07-21 ENCOUNTER — Encounter: Payer: Self-pay | Admitting: Family Medicine

## 2023-07-21 MED ORDER — DOXYCYCLINE HYCLATE 100 MG PO TABS: 100.0000 mg | ORAL_TABLET | Freq: Two times a day (BID) | ORAL | 0 refills | Status: DC

## 2023-07-22 ENCOUNTER — Other Ambulatory Visit: Payer: Self-pay | Admitting: Family Medicine

## 2023-07-22 MED ORDER — FLUCONAZOLE 150 MG PO TABS: 150.0000 mg | ORAL_TABLET | Freq: Once | ORAL | 0 refills | Status: AC

## 2023-07-24 ENCOUNTER — Other Ambulatory Visit: Payer: Self-pay | Admitting: Neurology

## 2023-07-25 ENCOUNTER — Ambulatory Visit: Payer: Medicaid Other

## 2023-07-25 DIAGNOSIS — G459 Transient cerebral ischemic attack, unspecified: Secondary | ICD-10-CM

## 2023-07-25 LAB — CUP PACEART REMOTE DEVICE CHECK
Date Time Interrogation Session: 20250127024800
Implantable Pulse Generator Implant Date: 20241227
Pulse Gen Serial Number: 124096

## 2023-07-27 ENCOUNTER — Encounter: Payer: Self-pay | Admitting: Internal Medicine

## 2023-07-27 ENCOUNTER — Ambulatory Visit: Payer: Medicaid Other | Admitting: Internal Medicine

## 2023-07-27 NOTE — Progress Notes (Deleted)
Name: [name]  MRN/ DOB: [id number], 04/26/1961   Age/ Sex: 63 y.o., female    PCP: Hoy Register, MD   Reason for Endocrinology Evaluation: Type 2 Diabetes Mellitus     Date of Initial Endocrinology Visit: 12/19/2020    PATIENT IDENTIFIER: [name] is a 63 y.o. female with a past medical history of T2DM, HTN, COPD  , retinitis pigmentosa ,and CHF. The patient presented for initial endocrinology clinic visit on 12/19/2020 for consultative assistance with her diabetes management.    HPI: [name] was    Diagnosed with DM 2017 Prior Medications tried/Intolerance: Metformin - diarrhea, Trulicity - nausea                  Hemoglobin A1c has ranged from 9.0% in 2018, peaking at 13.4% in 2022.  On her initial visit to our clinic her A1c was 11.9% , she was on insulin mix and Glimepiride , which we stopped and started her on Trulicity and Lantus    SUBJECTIVE:   During the last visit (04/09/2022): A1c 8.3 %    Today (07/27/23): [name] is here for a follow up on diabetes management. She has NOT been to our clinic in 15 months.. She checks her blood sugars 4-5  times daily through CGM. The patient has not had hypoglycemic episodes since the last clinic visit, occur sporadically at night    She denies nausea but has noted loose stools She recently had another COVID infection requiring prednisone intake I did prescribe OmniPod but she tells me it was stolen She has ingrown nails of the 2 great toes and have been bothersome to her  HOME DIABETES REGIMEN: Novolog 12 units TIDQAC Lantus 24 units daily CF: Novolog (BG -130/30)     Statin: yes ACE-I/ARB: Allergic to lisinopril - swelling  Prior Diabetic Education: no    CONTINUOUS GLUCOSE MONITORING RECORD INTERPRETATION    Dates of Recording: 9/30 - 04/09/2022  Sensor description: dexcom   Results statistics:   CGM use % of time 79  Average and SD 204/56  Time in range    36%  % Time  Above 180 43  % Time above 250 20  % Time Below target <1     Glycemic patterns summary: Bg's optimal overnight, hyperglycemia noted postprandial   Hyperglycemic episodes postprandial  Hypoglycemic episodes and a over the past 2 weeks  Overnight periods: stable        DIABETIC COMPLICATIONS: Microvascular complications:  Neuropathy Denies: CKD,retinopathy  Last eye exam: 12/2020  Macrovascular complications:  CHF  Denies: CAD, PVD, CVA   PAST HISTORY: Past Medical History:  Past Medical History:  Diagnosis Date   Anxiety    Arthritis    Chronic systolic heart failure (HCC)    EF 20-25% ECHO 05/2015   COPD (chronic obstructive pulmonary disease) (HCC)    Depression    Diabetes mellitus without complication (HCC)    Type II   Dysrhythmia    pt unsure of name of arrythmia - " my heart rate will drop all of a sudden" -no current treatment - atrial flutter   Fibrillation, atrial (HCC)    Gallstones    GERD (gastroesophageal reflux disease)    Headache(784.0)    migraines   Hypertension    Osteomyelitis (HCC)    R ankle   Rash    arms   Past Surgical History:  Past Surgical History:  Procedure Laterality Date   ANKLE FRACTURE SURGERY Right 2015  CARDIAC CATHETERIZATION N/A 06/24/2015   Procedure: Left Heart Cath and Coronary Angiography;  Surgeon: [name], MD;  Location: [facility] INVASIVE CV LAB;  Service: Cardiovascular;  Laterality: N/A;   CARPAL TUNNEL RELEASE     bil   CHOLECYSTECTOMY  11/24/2011   Procedure: LAPAROSCOPIC CHOLECYSTECTOMY WITH INTRAOPERATIVE CHOLANGIOGRAM;  Surgeon: Clovis Pu. Cornett, MD;  Location: [facility] ORS;  Service: General;  Laterality: N/A;  Laparoscopic Cholecystectomy with Cholangiogram   COLONOSCOPY     ECTOPIC PREGNANCY SURGERY  many yrs ago   HARDWARE REMOVAL Right 07/15/2015   Procedure: RIGHT ANKLE HARDWARE REMOVAL, PLACEMENT OF STIMULAN ANTIBIOTIC BEADS AND PREVENA WOUND VAC.;  Surgeon: [name], MD;  Location: [facility]  OR;  Service: Orthopedics;  Laterality: Right;   TOOTH EXTRACTION N/A 12/03/2016   Procedure: DENTAL EXTRACTIONS with Alveoloplasty;  Surgeon: Ocie Doyne, DDS;  Location: [facility] OR;  Service: Oral Surgery;  Laterality: N/A;    Social History:  reports that she has been smoking cigarettes and e-cigarettes. She started smoking about 6 years ago. She has a 20.5 pack-year smoking history. She has never used smokeless tobacco. She reports that she does not drink alcohol and does not use drugs. Family History:  Family History  Problem Relation Age of Onset   Diabetes Mother    Lung cancer Father    Heart attack Father 69       CABG x4   Breast cancer Sister        Survivor      HOME MEDICATIONS: Allergies as of 07/27/2023       Reactions   Biaxin [clarithromycin] Shortness Of Breath, Swelling   Lisinopril Swelling, Cough   Lip edema   Sulfa Antibiotics Anaphylaxis, Shortness Of Breath, Swelling, Hypertension   Chlorthalidone Nausea And Vomiting, Other (See Comments)   Clammy, Tachycardia, Headache   Daptomycin Nausea And Vomiting   Latex Hives, Rash   Amoxicillin-pot Clavulanate Diarrhea   Has patient had a PCN reaction causing immediate rash, facial/tongue/throat swelling, SOB or lightheadedness with hypotension:No Has patient had a PCN reaction causing severe rash involving mucus membranes or skin necrosis:No Has patient had a PCN reaction that required hospitalization:No Has patient had a PCN reaction occurring within THE LAST 10 YEARS.  #  #  #  YES  #  #  #  If all of the above answers are "NO", then may proceed with Cephalosporin use.   Aspirin Nausea And Vomiting, Rash   On 325mg  dosage   Cholestyramine Nausea And Vomiting   Dilaudid [hydromorphone Hcl] Nausea And Vomiting   Lasix [furosemide] Nausea And Vomiting, Other (See Comments)   Headache         Medication List        Accurate as of July 27, 2023 12:33 PM. If you have any questions, ask your nurse or doctor.           Accu-Chek Guide Me w/Device Kit Use to check blood sugar TID.   Accu-Chek Guide test strip Generic drug: glucose blood USE AS DIRECTED IN THE MORNING, AT NOON, IN THE EVENING AND AT BEDTIME   accu-chek softclix lancets Use as instructed daily.   Accu-Chek Softclix Lancets lancets Use to check blood sugar THREE TIMES DAILY   albuterol 108 (90 Base) MCG/ACT inhaler Commonly known as: VENTOLIN HFA Inhale 2 puffs into the lungs every 4 (four) hours as needed for wheezing or shortness of breath.   amitriptyline 50 MG tablet Commonly known as: ELAVIL TAKE 1 TABLET(50 MG) BY  MOUTH AT BEDTIME   BD Pen Needle Nano 2nd Gen 32G X 4 MM Misc Generic drug: Insulin Pen Needle USE 1 PEN NEEDLE UNDER THE SKIN FOUR TIMES DAILY   cetirizine 10 MG tablet Commonly known as: ZYRTEC TAKE 1 TABLET(10 MG) BY MOUTH DAILY   clopidogrel 75 MG tablet Commonly known as: Plavix Take 1 tablet (75 mg total) by mouth daily.   dapagliflozin propanediol 10 MG Tabs tablet Commonly known as: Farxiga Take 1 tablet (10 mg total) by mouth daily before breakfast.   Dexcom G6 Sensor Misc USE 1 DEVICE AS DIRECTED   Dexcom G6 Transmitter Misc Change every 90 days   dicyclomine 10 MG capsule Commonly known as: BENTYL TAKE ONE CAPSULE BY MOUTH THREE TIMES DAILY BEFORE MEALS What changed:  how much to take how to take this when to take this additional instructions   diphenoxylate-atropine 2.5-0.025 MG tablet Commonly known as: LOMOTIL TAKE ONE TABLET BY MOUTH TWICE DAILY   doxycycline 100 MG tablet Commonly known as: VIBRA-TABS Take 1 tablet (100 mg total) by mouth 2 (two) times daily.   fenofibrate 145 MG tablet Commonly known as: TRICOR TAKE ONE TABLET BY MOUTH EVERY DAY   FLUoxetine 40 MG capsule Commonly known as: PROZAC TAKE ONE CAPSULE BY MOUTH AT BEDTIME   hydrOXYzine 10 MG tablet Commonly known as: ATARAX TAKE ONE TABLET BY MOUTH THREE TIMES DAILY AS NEEDED   Lantus  SoloStar 100 UNIT/ML Solostar Pen Generic drug: insulin glargine Inject 30 Units into the skin at bedtime.   meclizine 25 MG tablet Commonly known as: ANTIVERT Take 1 tablet (25 mg total) by mouth 3 (three) times daily as needed for dizziness. What changed: when to take this   metoprolol succinate 25 MG 24 hr tablet Commonly known as: TOPROL-XL Take 1 tablet (25 mg total) by mouth daily.   mupirocin ointment 2 % Commonly known as: BACTROBAN Apply 1 Application topically 2 (two) times daily. What changed:  when to take this reasons to take this   NovoLOG FlexPen 100 UNIT/ML FlexPen Generic drug: insulin aspart INJECT MAXIMUM DAILY 50 UNITS PER CORRECTION SCALE   omeprazole 40 MG capsule Commonly known as: PRILOSEC Take 1 capsule (40 mg total) by mouth daily.   Omnipod 5 DexG7G6 Pods Gen 5 Misc USE 1 DEVICE EVERY 3 DAYS   pregabalin 150 MG capsule Commonly known as: LYRICA TAKE ONE CAPSULE BY MOUTH TWICE DAILY   promethazine 25 MG tablet Commonly known as: PHENERGAN 1/2-1 tab every 4 to 6 hours prn nausea   rosuvastatin 20 MG tablet Commonly known as: Crestor Take 1 tablet (20 mg total) by mouth daily.   Spiriva Respimat 1.25 MCG/ACT Aers Generic drug: Tiotropium Bromide Monohydrate INHALE 2 PUFFS INTO THE LUNGS DAILY   spironolactone 25 MG tablet Commonly known as: ALDACTONE Take 0.5 tablets (12.5 mg total) by mouth daily.         ALLERGIES: Allergies  Allergen Reactions   Biaxin [Clarithromycin] Shortness Of Breath and Swelling   Lisinopril Swelling and Cough    Lip edema   Sulfa Antibiotics Anaphylaxis, Shortness Of Breath, Swelling and Hypertension   Chlorthalidone Nausea And Vomiting and Other (See Comments)    Clammy, Tachycardia, Headache    Daptomycin Nausea And Vomiting   Latex Hives and Rash   Amoxicillin-Pot Clavulanate Diarrhea    Has patient had a PCN reaction causing immediate rash, facial/tongue/throat swelling, SOB or lightheadedness  with hypotension:No Has patient had a PCN reaction causing severe rash involving mucus  membranes or skin necrosis:No Has patient had a PCN reaction that required hospitalization:No Has patient had a PCN reaction occurring within THE LAST 10 YEARS.  #  #  #  YES  #  #  #  If all of the above answers are "NO", then may proceed with Cephalosporin use.    Aspirin Nausea And Vomiting and Rash    On 325mg  dosage   Cholestyramine Nausea And Vomiting   Dilaudid [Hydromorphone Hcl] Nausea And Vomiting   Lasix [Furosemide] Nausea And Vomiting and Other (See Comments)    Headache        OBJECTIVE:   VITAL SIGNS: LMP 06/09/2011 Comment: verified BEFORE imaging   PHYSICAL EXAM:  General: Pt appears well and is in NAD  Lungs: Scattered rhonchi  Heart: RRR  Abdomen: soft, nontender  Extremities:  Lower extremities - No pretibial edema.  Neuro: MS is good with appropriate affect, pt is alert and Ox3   DM foot exam 04/09/2022  The skin of the feet is intact without sores or ulcerations. The pedal pulses are 2+ on right and 2+ on left. The sensation is intact to a screening 5.07, 10 gram monofilament bilaterally   DATA REVIEWED:  Lab Results  Component Value Date   HGBA1C 11.4 (H) 04/13/2023   HGBA1C 8.4 (H) 06/03/2022   HGBA1C 8.3 (A) 04/09/2022    Latest Reference Range & Units 06/03/23 16:35  Sodium 134 - 144 mmol/L 142  Potassium 3.5 - 5.2 mmol/L 4.2  Chloride 96 - 106 mmol/L 105  CO2 20 - 29 mmol/L 22  Glucose 70 - 99 mg/dL 045 (H)  BUN 8 - 27 mg/dL 9  Creatinine 4.09 - 8.11 mg/dL 9.14  Calcium 8.7 - 78.2 mg/dL 9.1  BUN/Creatinine Ratio 12 - 28  13  eGFR >59 mL/min/1.73 95  (H): Data is abnormally high ASSESSMENT / PLAN / RECOMMENDATIONS:   1) Type 2 Diabetes Mellitus, Poorly controlled, With neuropathic  complications - Most recent A1c of 8.3 %. Goal A1c < 7.0 %.    -I have praised the patient on continued glycemic improvement, A1c down from 9.2% to 8.3% -Intolerant  to Trulicity 0.75 mg weekly -I prescribed OmniPod for her, but the patient tells me this was stolen, I will refill this again, I will refer her to our CDE for training -She was also provided with a pamphlet to train her pump prior to training -In review on her CGM data, patient has been noted with postprandial hyperglycemia, will increase NovoLog as below  MEDICATIONS: Continue Lantus 24 units once daily  Increase NovoLog  12 units 3 times daily before every meal Continue correction factor: NovoLog (BG -130/30)   EDUCATION / INSTRUCTIONS: BG monitoring instructions: Patient is instructed to check her blood sugars 1 times a day, fasting. Call [facility] [facility] if: BG persistently < 70  I reviewed the Rule of 15 for the treatment of hypoglycemia in detail with the patient. Literature supplied.   2) Diabetic complications:  Eye: Does not have known diabetic retinopathy.  Neuro/ Feet: Does  have known diabetic peripheral neuropathy. Renal: Patient does not have known baseline CKD. She is not on an ACEI/ARB at present.   4) ingrown Toe nail   -Referred to podiatry for further management  F/U in 4 months    Signed electronically by: Lyndle Herrlich, MD  [facility] Endocrinology  [facility] [address] [place] [place], [place] [address] Phone: [phone] FAX: [phone]   CC: Alvis Lemmings  Odette Horns, MD [address] [place] [address] Phone: [phone]  Fax: [phone]    Return to Endocrinology clinic as below: Future Appointments  Date Time Provider Department Center  07/27/2023  1:00 PM Tyeasha Ebbs, Konrad Dolores, MD [facility] None  08/05/2023 11:20 AM Perlie Gold, [name] [facility]  08/24/2023  2:00 PM Unk Lightning, PA [facility]  08/29/2023  7:35 AM [facility] DEVICE REMOTES [facility]  09/13/2023  2:10 PM Hoy Register, MD [facility] None  10/03/2023  7:35 AM  [facility] DEVICE REMOTES [facility]  11/07/2023  7:35 AM [facility] DEVICE REMOTES [facility]  11/15/2023  1:45 PM Windell Norfolk, MD [facility] None  12/12/2023  7:35 AM [facility] DEVICE REMOTES [facility]  01/16/2024  7:35 AM [facility] DEVICE REMOTES [facility]  02/20/2024  7:35 AM [facility] DEVICE REMOTES [facility]  03/26/2024  7:35 AM [facility] DEVICE REMOTES [facility]

## 2023-08-01 ENCOUNTER — Other Ambulatory Visit: Payer: Self-pay | Admitting: Internal Medicine

## 2023-08-05 ENCOUNTER — Ambulatory Visit: Payer: Medicaid Other | Admitting: Cardiology

## 2023-08-08 ENCOUNTER — Encounter: Payer: Self-pay | Admitting: Cardiology

## 2023-08-10 ENCOUNTER — Other Ambulatory Visit: Payer: Self-pay | Admitting: Family Medicine

## 2023-08-10 ENCOUNTER — Other Ambulatory Visit: Payer: Self-pay | Admitting: Internal Medicine

## 2023-08-10 ENCOUNTER — Other Ambulatory Visit: Payer: Self-pay | Admitting: Neurology

## 2023-08-10 DIAGNOSIS — H8113 Benign paroxysmal vertigo, bilateral: Secondary | ICD-10-CM

## 2023-08-10 DIAGNOSIS — K580 Irritable bowel syndrome with diarrhea: Secondary | ICD-10-CM

## 2023-08-10 NOTE — Telephone Encounter (Signed)
Dispensed Days Supply Quantity Provider Pharmacy  pregabalin 150 mg capsule 06/15/2023 30 60 each Penumalli, Glenford Bayley, MD [facility] [place]  pregabalin 150 mg capsule 05/16/2023 30 60 each Penumalli, Glenford Bayley, MD [facility] [place]  pregabalin 150 mg capsule 04/12/2023 30 60 each Penumalli, Glenford Bayley, MD [facility] [place]  pregabalin 150 mg capsule 03/14/2023 30 60 each Penumalli, Glenford Bayley, MD [facility] [place]  PREGABALIN 150MG  CAPSULES 02/14/2023 30 60 each Windell Norfolk, MD [facility] #...  pregabalin 150 mg capsule 01/14/2023 30 60 each Windell Norfolk, MD [facility] [place]  pregabalin 150 mg capsule 12/16/2022 30 60 each Windell Norfolk, MD [facility] [place]  pregabalin 150 mg capsule 11/04/2022 30 60 each Windell Norfolk, MD [facility] [place]  pregabalin 150 mg capsule 10/07/2022 30 60 each Windell Norfolk, MD [facility] [place]  PREGABALIN 150MG  CAPSULES 09/09/2022 30 60 each Windell Norfolk, MD [facility] #...    Last visit 04/21/23 Next visit 11/20/2023

## 2023-08-23 ENCOUNTER — Other Ambulatory Visit: Payer: Self-pay | Admitting: Family Medicine

## 2023-08-24 ENCOUNTER — Encounter: Payer: Self-pay | Admitting: Physician Assistant

## 2023-08-24 ENCOUNTER — Other Ambulatory Visit: Payer: Medicaid Other

## 2023-08-24 ENCOUNTER — Ambulatory Visit (INDEPENDENT_AMBULATORY_CARE_PROVIDER_SITE_OTHER): Payer: Medicaid Other | Admitting: Physician Assistant

## 2023-08-24 VITALS — BP 110/64 | HR 85 | Ht 67.0 in | Wt 156.0 lb

## 2023-08-24 DIAGNOSIS — R1030 Lower abdominal pain, unspecified: Secondary | ICD-10-CM

## 2023-08-24 DIAGNOSIS — K219 Gastro-esophageal reflux disease without esophagitis: Secondary | ICD-10-CM | POA: Diagnosis not present

## 2023-08-24 DIAGNOSIS — K529 Noninfective gastroenteritis and colitis, unspecified: Secondary | ICD-10-CM

## 2023-08-24 DIAGNOSIS — K580 Irritable bowel syndrome with diarrhea: Secondary | ICD-10-CM

## 2023-08-24 DIAGNOSIS — R143 Flatulence: Secondary | ICD-10-CM

## 2023-08-24 MED ORDER — DICYCLOMINE HCL 10 MG PO CAPS: ORAL_CAPSULE | ORAL | 5 refills | Status: DC

## 2023-08-24 NOTE — Progress Notes (Signed)
 I agree with assessment and plan as outlined by Ms. Lemmon. ?

## 2023-08-24 NOTE — Progress Notes (Addendum)
 Chief Complaint: IBS  HPI:    [name] is a 63 year old female with a past medical history as listed below including stroke on Plavix , chronic systolic heart failure with decreased EF (04/12/2444-50%), COPD, diabetes, A-fib, GERD, hypertension and multiple others, who previously followed with [facility], who was referred to me by Ebony Mulberry, MD for a complaint of IBS.      11/02/2015 consult in the hospital by Dr. Sabra [name] for rectal bleeding.  At that time suspected perianal given recent colonoscopy reports.    06/03/2023 BMP with elevated glucose of 187 otherwise normal.    Today, patient presents to clinic and explains that she has always had some GI issues over the past 4 years or so.  She was following with [facility] who apparently diagnosed her with IBS-D but she tells me they never really did much of a workup other than a colonoscopy about 4 years ago which was normal.  Since then she had a Cologuard about 2 years ago which was negative.  Tells me that she has trouble with gas from both ends but mostly flatulence typically when she stands up to move around or walk across the room or really at any time at all.  Also has trouble with diarrhea.  Most recently has been affecting her lifestyle because she has to be within 10 minutes of a bathroom anytime she eats anything.  Most of the time has diarrhea very occasionally it has some form.  This is regardless of using her Dicyclomine  10 mg twice a day which she has been on for years as well as Lomotil  1 to 2 tablets twice a day.  Also associated with some lower abdominal cramping which can come before a bowel movement or sometimes by itself.  Also describes some mucousy stools lately.    Also describes history of hiatal hernia but no prior EGD.  Currently on Omeprazole  40 mg daily for reflux.    Also describes recent strokes and is currently on Plavix .    Denies fever, chills, weight loss or blood in her stool.  Past Medical History:  Diagnosis Date    Anxiety    Arthritis    Chronic systolic heart failure (HCC)    EF 20-25% ECHO 05/2015   COPD (chronic obstructive pulmonary disease) (HCC)    Depression    Diabetes mellitus without complication (HCC)    Type II   Dysrhythmia    pt unsure of name of arrythmia - " my heart rate will drop all of a sudden" -no current treatment - atrial flutter   Fibrillation, atrial (HCC)    Gallstones    GERD (gastroesophageal reflux disease)    Headache(784.0)    migraines   Hypertension    Osteomyelitis (HCC)    R ankle   Rash    arms    Past Surgical History:  Procedure Laterality Date   ANKLE FRACTURE SURGERY Right 2015   CARDIAC CATHETERIZATION N/A 06/24/2015   Procedure: Left Heart Cath and Coronary Angiography;  Surgeon: Ebony Binning, MD;  Location: [facility] INVASIVE CV LAB;  Service: Cardiovascular;  Laterality: N/A;   CARPAL TUNNEL RELEASE     bil   CHOLECYSTECTOMY  11/24/2011   Procedure: LAPAROSCOPIC CHOLECYSTECTOMY WITH INTRAOPERATIVE CHOLANGIOGRAM;  Surgeon: Ebony Cal. Cornett, MD;  Location: [facility] ORS;  Service: General;  Laterality: N/A;  Laparoscopic Cholecystectomy with Cholangiogram   COLONOSCOPY     ECTOPIC PREGNANCY SURGERY  many yrs ago   HARDWARE REMOVAL Right 07/15/2015  Procedure: RIGHT ANKLE HARDWARE REMOVAL, PLACEMENT OF STIMULAN ANTIBIOTIC BEADS AND PREVENA WOUND VAC.;  Surgeon: Ebony Mesi, MD;  Location: [facility] OR;  Service: Orthopedics;  Laterality: Right;   TOOTH EXTRACTION N/A 12/03/2016   Procedure: DENTAL EXTRACTIONS with Alveoloplasty;  Surgeon: [name], DDS;  Location: [facility] OR;  Service: Oral Surgery;  Laterality: N/A;    Current Outpatient Medications  Medication Sig Dispense Refill   ACCU-CHEK GUIDE test strip USE AS DIRECTED IN THE MORNING, AT NOON, IN THE EVENING AND AT BEDTIME 400 strip 3   Accu-Chek Softclix Lancets lancets Use to check blood sugar THREE TIMES DAILY 100 each 2   albuterol  (VENTOLIN  HFA) 108 (90 Base) MCG/ACT inhaler Inhale 2 puffs  into the lungs every 4 (four) hours as needed for wheezing or shortness of breath. 1 each 0   amitriptyline  (ELAVIL ) 50 MG tablet TAKE 1 TABLET(50 MG) BY MOUTH AT BEDTIME 90 tablet 3   BD PEN NEEDLE NANO 2ND GEN 32G X 4 MM MISC USE 1 PEN NEEDLE UNDER THE SKIN FOUR TIMES DAILY 400 each 2   Blood Glucose Monitoring Suppl (ACCU-CHEK GUIDE ME) w/Device KIT Use to check blood sugar TID. 1 kit 0   cetirizine  (ZYRTEC ) 10 MG tablet TAKE 1 TABLET(10 MG) BY MOUTH DAILY 30 tablet 0   clopidogrel  (PLAVIX ) 75 MG tablet Take 1 tablet (75 mg total) by mouth daily. 90 tablet 1   Continuous Glucose Sensor (DEXCOM G6 SENSOR) MISC USE 1 DEVICE AS DIRECTED 9 each 0   Continuous Glucose Transmitter (DEXCOM G6 TRANSMITTER) MISC Change every 90 days 1 each 0   dapagliflozin  propanediol (FARXIGA ) 10 MG TABS tablet Take 1 tablet (10 mg total) by mouth daily before breakfast. 90 tablet 3   dicyclomine  (BENTYL ) 10 MG capsule TAKE 1 CAPSULE BY MOUTH THREE TIMES DAILY BEFORE MEALS 90 capsule 0   diphenoxylate -atropine  (LOMOTIL ) 2.5-0.025 MG tablet TAKE ONE TABLET BY MOUTH TWICE DAILY 60 tablet 3   doxycycline  (VIBRA -TABS) 100 MG tablet Take 1 tablet (100 mg total) by mouth 2 (two) times daily. 20 tablet 0   fenofibrate  (TRICOR ) 145 MG tablet TAKE ONE TABLET BY MOUTH EVERY DAY 90 tablet 0   FLUoxetine  (PROZAC ) 40 MG capsule TAKE ONE CAPSULE BY MOUTH AT BEDTIME 90 capsule 1   hydrOXYzine  (ATARAX ) 10 MG tablet TAKE ONE TABLET BY MOUTH THREE TIMES DAILY AS NEEDED 90 tablet 2   Insulin  Disposable Pump (OMNIPOD 5 G6 PODS, GEN 5,) MISC USE 1 DEVICE EVERY 3 DAYS 30 each 3   insulin  glargine (LANTUS  SOLOSTAR) 100 UNIT/ML Solostar Pen Inject 30 Units into the skin at bedtime.     Lancet Devices (ACCU-CHEK SOFTCLIX) lancets Use as instructed daily. 1 each 5   meclizine  (ANTIVERT ) 25 MG tablet TAKE 1 TABLET(25 MG) BY MOUTH THREE TIMES DAILY AS NEEDED FOR DIZZINESS 90 tablet 0   metoprolol  succinate (TOPROL -XL) 25 MG 24 hr tablet Take 1  tablet (25 mg total) by mouth daily. 90 tablet 1   mupirocin  ointment (BACTROBAN ) 2 % Apply 1 Application topically 2 (two) times daily. (Patient taking differently: Apply 1 Application topically daily as needed (for folliculitis).) 22 g 1   NOVOLOG  FLEXPEN 100 UNIT/ML FlexPen INJECT MAXIMUM DAILY 50 UNITS PER CORRECTION SCALE 15 mL 0   omeprazole  (PRILOSEC) 40 MG capsule TAKE 1 CAPSULE(40 MG) BY MOUTH DAILY 30 capsule 0   pregabalin  (LYRICA ) 150 MG capsule TAKE 1 CAPSULE(150 MG) BY MOUTH TWICE DAILY 60 capsule 5   promethazine  (PHENERGAN ) 25  MG tablet 1/2-1 tab every 4 to 6 hours prn nausea 20 tablet 0   rosuvastatin  (CRESTOR ) 20 MG tablet Take 1 tablet (20 mg total) by mouth daily. 30 tablet 1   SPIRIVA  RESPIMAT 1.25 MCG/ACT AERS INHALE 2 PUFFS INTO THE LUNGS DAILY 4 g 5   spironolactone  (ALDACTONE ) 25 MG tablet Take 0.5 tablets (12.5 mg total) by mouth daily. 45 tablet 3   No current facility-administered medications for this visit.   Facility-Administered Medications Ordered in Other Visits  Medication Dose Route Frequency Provider Last Rate Last Admin   regadenoson  (LEXISCAN ) injection SOLN 0.4 mg  0.4 mg Intravenous Once [name], Ebony H, MD       technetium tetrofosmin  (TC-MYOVIEW ) injection 32.8 millicurie  32.8 millicurie Intravenous Once PRN Ebony Riggers, MD        Allergies as of 08/24/2023 - Review Complete 08/24/2023  Allergen Reaction Noted   Biaxin [clarithromycin] Shortness Of Breath and Swelling 04/08/2013   Lisinopril Swelling and Cough 09/05/2011   Sulfa antibiotics Anaphylaxis, Shortness Of Breath, Swelling, and Hypertension 09/05/2011   Chlorthalidone Nausea And Vomiting and Other (See Comments) 10/08/2011   Daptomycin Nausea And Vomiting 08/07/2015   Latex Hives and Rash 09/05/2011   Amoxicillin -pot clavulanate Diarrhea 09/05/2011   Aspirin  Nausea And Vomiting and Rash 09/05/2011   Cholestyramine Nausea And Vomiting 04/10/2015   Dilaudid  [hydromorphone  hcl]  Nausea And Vomiting 09/05/2011   Lasix [furosemide] Nausea And Vomiting and Other (See Comments) 10/08/2011    Family History  Problem Relation Age of Onset   Diabetes Mother    Lung cancer Father    Heart attack Father 41       CABG x4   Breast cancer Sister        Survivor     Social History   Socioeconomic History   Marital status: Divorced    Spouse name: Not on file   Number of children: Not on file   Years of education: Not on file   Highest education level: Not on file  Occupational History   Not on file  Tobacco Use   Smoking status: Every Day    Current packs/day: 1.00    Average packs/day: 1 pack/day for 20.6 years (20.6 ttl pk-yrs)    Types: Cigarettes, E-cigarettes    Start date: 09/19/2016   Smokeless tobacco: Never   Tobacco comments:    Pt stated smoking 5 cigarettes daily  updated 06/08/2023 [name], cma  Vaping Use   Vaping status: Every Day   Start date: 10/12/2016  Substance and Sexual Activity   Alcohol use: No    Alcohol/week: 0.0 standard drinks of alcohol   Drug use: No   Sexual activity: Not Currently    Birth control/protection: Post-menopausal  Other Topics Concern   Not on file  Social History Narrative   ** Merged History Encounter **    Right handed   Caffeine- 1 cup daily   Social Drivers of Health   Financial Resource Strain: Medium Risk (04/25/2023)   Overall Financial Resource Strain (CARDIA)    Difficulty of Paying Living Expenses: Somewhat hard  Food Insecurity: Food Insecurity Present (04/25/2023)   Hunger Vital Sign    Worried About Running Out of Food in the Last Year: Sometimes true    Ran Out of Food in the Last Year: Sometimes true  Transportation Needs: No Transportation Needs (04/14/2023)   PRAPARE - Administrator, Civil Service (Medical): No    Lack of Transportation (Non-Medical): No  Physical Activity: Insufficiently Active (04/25/2023)   Exercise Vital Sign    Days of Exercise per Week: 3 days     Minutes of Exercise per Session: 10 min  Stress: No Stress Concern Present (04/25/2023)   [employer] of Occupational Health - Occupational Stress Questionnaire    Feeling of Stress : Not at all  Social Connections: Socially Isolated (04/25/2023)   Social Connection and Isolation Panel [NHANES]    Frequency of Communication with Friends and Family: More than three times a week    Frequency of Social Gatherings with Friends and Family: More than three times a week    Attends Religious Services: Never    Database administrator or Organizations: No    Attends Banker Meetings: Never    Marital Status: Divorced  Catering manager Violence: Not At Risk (04/25/2023)   Humiliation, Afraid, Rape, and Kick questionnaire    Fear of Current or Ex-Partner: No    Emotionally Abused: No    Physically Abused: No    Sexually Abused: No    Review of Systems:    Constitutional: No weight loss, fever or chills Skin: No rash Cardiovascular: No chest pain Respiratory: No SOB  Gastrointestinal: See HPI and otherwise negative Genitourinary: No dysuria Neurological: No headache, dizziness or syncope Musculoskeletal: No new muscle or joint pain Hematologic: No bleeding Psychiatric: No history of depression or anxiety   Physical Exam:  Vital signs: BP 110/64   Pulse 85   Ht 5\' 7"  (1.702 m)   Wt 156 lb (70.8 kg)   LMP 06/09/2011 Comment: verified BEFORE imaging  BMI 24.43 kg/m   Constitutional:   Pleasant Caucasian female appears to be in NAD, Well developed, Well nourished, alert and cooperative Head:  Normocephalic and atraumatic. Eyes:   PEERL, EOMI. No icterus. Conjunctiva pink. Ears:  Normal auditory acuity. Neck:  Supple Throat: Oral cavity and pharynx without inflammation, swelling or lesion.  Respiratory: Respirations even and unlabored. Lungs clear to auscultation bilaterally.   No wheezes, crackles, or rhonchi.  Cardiovascular: Normal S1, S2. No MRG. Regular rate  and rhythm. No peripheral edema, cyanosis or pallor.  Gastrointestinal:  Soft, nondistended, nontender. No rebound or guarding. Normal bowel sounds. No appreciable masses or hepatomegaly. Rectal:  Not performed.  Msk:  Symmetrical without gross deformities. Without edema, no deformity or joint abnormality.  Neurologic:  Alert and  oriented x4;  grossly normal neurologically.  Skin:   Dry and intact without significant lesions or rashes. Psychiatric: Demonstrates good judgement and reason without abnormal affect or behaviors.  RELEVANT LABS AND IMAGING: CBC    Component Value Date/Time   WBC 9.5 04/25/2023 1122   WBC 8.4 04/13/2023 0818   RBC 5.13 04/25/2023 1122   RBC 5.15 ([name]) 04/13/2023 0818   HGB 14.1 04/25/2023 1122   HCT 44.4 04/25/2023 1122   PLT 216 04/25/2023 1122   MCV 87 04/25/2023 1122   MCH 27.5 04/25/2023 1122   MCH 28.0 04/13/2023 0818   MCHC 31.8 04/25/2023 1122   MCHC 33.0 04/13/2023 0818   RDW 13.2 04/25/2023 1122   LYMPHSABS 3.5 04/13/2023 0006   LYMPHSABS 3.0 11/25/2021 1645   MONOABS 0.5 04/13/2023 0006   EOSABS 0.3 04/13/2023 0006   EOSABS 0.3 11/25/2021 1645   BASOSABS 0.1 04/13/2023 0006   BASOSABS 0.1 11/25/2021 1645    CMP     Component Value Date/Time   NA 142 06/03/2023 1635   K 4.2 06/03/2023 1635   CL 105 06/03/2023 1635  CO2 22 06/03/2023 1635   GLUCOSE 187 ([name]) 06/03/2023 1635   GLUCOSE 220 ([name]) 04/13/2023 0006   BUN 9 06/03/2023 1635   CREATININE 0.72 06/03/2023 1635   CREATININE 0.59 07/05/2016 0947   CALCIUM  9.1 06/03/2023 1635   PROT 6.5 04/13/2023 0006   PROT 6.8 03/21/2023 0848   ALBUMIN 3.2 (L) 04/13/2023 0006   ALBUMIN 4.0 03/21/2023 0848   AST 14 (L) 04/13/2023 0006   ALT 16 04/13/2023 0006   ALKPHOS 82 04/13/2023 0006   BILITOT 0.5 04/13/2023 0006   BILITOT 0.3 03/21/2023 0848   GFRNONAA >60 04/13/2023 0006   GFRNONAA >89 07/05/2016 0947   GFRAA 114 07/11/2020 1002   GFRAA >89 07/05/2016 0947    Assessment: 1.   Chronic diarrhea: For the past 4 to 5 years, 10 minutes after eating, previously diagnosed with IBS-D, unsure of prior workup done at [facility]; consider IBS-Deavers microscopic colitis versus celiac disease versus other 2.  Lower abdominal cramping: Off-and-on, typically before a bowel movement; consider IBS versus other 3.  GERD: Controlled on Omeprazole  40 mg daily but I do not think patient has ever had an EGD; likely chronic gastritis 4.  Gas: Worse from the bottom and; consider SIBO versus relation to IBS  Plan: 1.  We will request records from [facility].  Hopefully we can see what they have already tested for and add things as necessary. 2.  Patient does tell me they never did stool studies.  Will go ahead and do a GI pathogen panel, fecal pancreatic elastase, fecal lactoferrin and calprotectin 3.  If it has not been done already needs testing for celiac disease as well as a TSH 4.  Increase Dicyclomine  to 10 mg 4 times daily, 20-30 minutes before meals and at bedtime #120 with 5 refills 5.  Continue Lomotil  1-2 tabs twice daily 6.  Continue Omeprazole  40 mg daily 7.  Could consider repeat colonoscopy +/- EGD in the future 8.  Could also consider testing for SIBO 9.  Patient to follow in clinic with me in 2 to 3 months or as directed by labs/records above.  She was assigned to Dr. Rosaline Coma this afternoon.  Reginal Capra, [name] [facility] 08/24/2023, 2:00 PM  Cc: Newlin, Enobong, MD   Addendum: 10/24/23 9:04 AM  Received records from [facility].  10/04/2012 colonoscopy with 110 mm polyp in the transverse colon otherwise normal.  Pathology showed tubular adenoma.  Repeat would be recommended in 3 years.  Awaiting other records. Patient is due for colonoscopy.  JLL

## 2023-08-24 NOTE — Patient Instructions (Signed)
 Your provider has requested that you go to the basement level for lab work before leaving today. Press "B" on the elevator. The lab is located at the first door on the left as you exit the elevator.   We have sent the following medications to your pharmacy for you to pick up at your convenience: Dicyclomine 10mg . Take 1 tablet 30 minutes before meals and at bedtime.  We will request your medical records from [facility].  Thank you for trusting me with your gastrointestinal care!   Hyacinth Meeker, PA   _______________________________________________________  If your blood pressure at your visit was 140/90 or greater, please contact your primary care physician to follow up on this.  _______________________________________________________  If you are age 62 or older, your body mass index should be between 23-30. Your Body mass index is 24.43 kg/m. If this is out of the aforementioned range listed, please consider follow up with your Primary Care Provider.  If you are age 4 or younger, your body mass index should be between 19-25. Your Body mass index is 24.43 kg/m. If this is out of the aformentioned range listed, please consider follow up with your Primary Care Provider.   ________________________________________________________  The [facility] providers would like to encourage you to use [facility] to communicate with providers for non-urgent requests or questions.  Due to long hold times on the telephone, sending your provider a message by [facility] may be a faster and more efficient way to get a response.  Please allow 48 business hours for a response.  Please remember that this is for non-urgent requests.  _______________________________________________________

## 2023-08-25 ENCOUNTER — Encounter: Payer: Self-pay | Admitting: Family Medicine

## 2023-08-25 ENCOUNTER — Other Ambulatory Visit: Payer: Self-pay | Admitting: Family Medicine

## 2023-08-25 MED ORDER — METOPROLOL SUCCINATE ER 25 MG PO TB24: 25.0000 mg | ORAL_TABLET | Freq: Every day | ORAL | 1 refills | Status: DC

## 2023-08-25 MED ORDER — SPIRONOLACTONE 25 MG PO TABS: 12.5000 mg | ORAL_TABLET | Freq: Every day | ORAL | 1 refills | Status: DC

## 2023-08-25 MED ORDER — ROSUVASTATIN CALCIUM 20 MG PO TABS: 20.0000 mg | ORAL_TABLET | Freq: Every day | ORAL | 1 refills | Status: DC

## 2023-08-26 ENCOUNTER — Other Ambulatory Visit: Payer: Self-pay | Admitting: Internal Medicine

## 2023-08-29 ENCOUNTER — Ambulatory Visit: Payer: Medicaid Other

## 2023-08-29 DIAGNOSIS — G459 Transient cerebral ischemic attack, unspecified: Secondary | ICD-10-CM | POA: Diagnosis not present

## 2023-08-29 LAB — CUP PACEART REMOTE DEVICE CHECK
Date Time Interrogation Session: 20250303005200
Implantable Pulse Generator Implant Date: 20241227
Pulse Gen Serial Number: 124096

## 2023-09-02 ENCOUNTER — Other Ambulatory Visit: Payer: Self-pay | Admitting: Family Medicine

## 2023-09-02 DIAGNOSIS — K580 Irritable bowel syndrome with diarrhea: Secondary | ICD-10-CM

## 2023-09-02 NOTE — Progress Notes (Signed)
 Bsx Loop Recorder

## 2023-09-08 ENCOUNTER — Other Ambulatory Visit: Payer: Self-pay | Admitting: Family Medicine

## 2023-09-08 DIAGNOSIS — Z794 Long term (current) use of insulin: Secondary | ICD-10-CM

## 2023-09-13 ENCOUNTER — Ambulatory Visit: Payer: Medicaid Other | Admitting: Family Medicine

## 2023-09-15 ENCOUNTER — Ambulatory Visit: Attending: Physician Assistant | Admitting: Physician Assistant

## 2023-09-15 ENCOUNTER — Encounter: Payer: Self-pay | Admitting: Physician Assistant

## 2023-09-15 VITALS — BP 109/65 | HR 87 | Wt 159.0 lb

## 2023-09-15 DIAGNOSIS — F17210 Nicotine dependence, cigarettes, uncomplicated: Secondary | ICD-10-CM

## 2023-09-15 DIAGNOSIS — Z794 Long term (current) use of insulin: Secondary | ICD-10-CM

## 2023-09-15 DIAGNOSIS — E1165 Type 2 diabetes mellitus with hyperglycemia: Secondary | ICD-10-CM | POA: Diagnosis not present

## 2023-09-15 DIAGNOSIS — J069 Acute upper respiratory infection, unspecified: Secondary | ICD-10-CM

## 2023-09-15 DIAGNOSIS — N898 Other specified noninflammatory disorders of vagina: Secondary | ICD-10-CM | POA: Diagnosis not present

## 2023-09-15 LAB — POCT GLYCOSYLATED HEMOGLOBIN (HGB A1C): HbA1c, POC (controlled diabetic range): 10.1 % — AB (ref 0.0–7.0)

## 2023-09-15 LAB — POC COVID19/FLU A&B COMBO
Covid Antigen, POC: NEGATIVE
Influenza A Antigen, POC: NEGATIVE
Influenza B Antigen, POC: NEGATIVE

## 2023-09-15 LAB — GLUCOSE, POCT (MANUAL RESULT ENTRY): POC Glucose: 233 mg/dL — AB (ref 70–99)

## 2023-09-15 MED ORDER — LANTUS SOLOSTAR 100 UNIT/ML ~~LOC~~ SOPN: 35.0000 [IU] | PEN_INJECTOR | Freq: Every day | SUBCUTANEOUS | 2 refills | Status: DC

## 2023-09-15 MED ORDER — NOVOLOG FLEXPEN 100 UNIT/ML ~~LOC~~ SOPN: 12.0000 [IU] | PEN_INJECTOR | Freq: Three times a day (TID) | SUBCUTANEOUS | 3 refills | Status: DC

## 2023-09-15 MED ORDER — ALBUTEROL SULFATE HFA 108 (90 BASE) MCG/ACT IN AERS: 2.0000 | INHALATION_SPRAY | RESPIRATORY_TRACT | 0 refills | Status: DC | PRN

## 2023-09-15 MED ORDER — CETIRIZINE HCL 10 MG PO TABS: 10.0000 mg | ORAL_TABLET | Freq: Every day | ORAL | 3 refills | Status: AC | PRN

## 2023-09-15 MED ORDER — BD PEN NEEDLE NANO 2ND GEN 32G X 4 MM MISC: 1.0000 | 2 refills | Status: DC

## 2023-09-15 MED ORDER — FENOFIBRATE 145 MG PO TABS: 145.0000 mg | ORAL_TABLET | Freq: Every day | ORAL | 0 refills | Status: DC

## 2023-09-15 MED ORDER — FLUCONAZOLE 150 MG PO TABS: 150.0000 mg | ORAL_TABLET | Freq: Once | ORAL | 0 refills | Status: AC

## 2023-09-15 NOTE — Progress Notes (Unsigned)
 Patient ID: [name], female   DOB: 11/12/1960, 63 y.o.   MRN: [id number]   [name], is a 63 y.o. female  [id number]  [id number]  DOB - 03/09/1961  Chief Complaint  Patient presents with   Nasal Congestion       Subjective:   [name] is a 63 y.o. female here today for med RF, vaginal itching, and sneezing and congestion that has been going on for 2 days.  No fever.  She says blood sugars at home usu about 130-160 but admits she does not check them regularly.  She says she takes her diabetes meds 90% of the time.  She had been referred to endocrine but kept missing appts so she is hoping we will work with her again on her diabetes.    No problems updated.  ALLERGIES: Allergies  Allergen Reactions   Biaxin [Clarithromycin] Shortness Of Breath and Swelling   Lisinopril Swelling and Cough    Lip edema   Sulfa Antibiotics Anaphylaxis, Shortness Of Breath, Swelling and Hypertension   Chlorthalidone Nausea And Vomiting and Other (See Comments)    Clammy, Tachycardia, Headache    Daptomycin Nausea And Vomiting   Latex Hives and Rash   Amoxicillin-Pot Clavulanate Diarrhea    Has patient had a PCN reaction causing immediate rash, facial/tongue/throat swelling, SOB or lightheadedness with hypotension:No Has patient had a PCN reaction causing severe rash involving mucus membranes or skin necrosis:No Has patient had a PCN reaction that required hospitalization:No Has patient had a PCN reaction occurring within THE LAST 10 YEARS.  #  #  #  YES  #  #  #  If all of the above answers are "NO", then may proceed with Cephalosporin use.    Aspirin Nausea And Vomiting and Rash    On 325mg  dosage   Cholestyramine Nausea And Vomiting   Dilaudid [Hydromorphone Hcl] Nausea And Vomiting   Lasix [Furosemide] Nausea And Vomiting and Other (See Comments)    Headache     PAST MEDICAL HISTORY: Past Medical History:  Diagnosis Date   Anxiety    Arthritis    Chronic  systolic heart failure (HCC)    EF 20-25% ECHO 05/2015   COPD (chronic obstructive pulmonary disease) (HCC)    Depression    Diabetes mellitus without complication (HCC)    Type II   Dysrhythmia    pt unsure of name of arrythmia - " my heart rate will drop all of a sudden" -no current treatment - atrial flutter   Fibrillation, atrial (HCC)    Gallstones    GERD (gastroesophageal reflux disease)    Headache(784.0)    migraines   Hypertension    Osteomyelitis (HCC)    R ankle   Rash    arms    MEDICATIONS AT HOME: Prior to Admission medications   Medication Sig Start Date End Date Taking? Authorizing Provider  ACCU-CHEK GUIDE test strip USE AS DIRECTED IN THE MORNING, AT NOON, IN THE EVENING AND AT BEDTIME 08/30/22   Shamleffer, Konrad Dolores, MD  Accu-Chek Softclix Lancets lancets Use to check blood sugar THREE TIMES DAILY 06/09/21   Pollyann Savoy, MD  albuterol (VENTOLIN HFA) 108 (90 Base) MCG/ACT inhaler Inhale 2 puffs into the lungs every 4 (four) hours as needed for wheezing or shortness of breath. 09/15/23   Anders Simmonds, [name]  amitriptyline (ELAVIL) 50 MG tablet TAKE 1 TABLET(50 MG) BY MOUTH AT BEDTIME 07/25/23   Windell Norfolk, MD  Blood Glucose Monitoring  Suppl (ACCU-CHEK GUIDE ME) w/Device KIT Use to check blood sugar TID. 01/30/21   Hoy Register, MD  cetirizine (ZYRTEC) 10 MG tablet Take 1 tablet (10 mg total) by mouth daily as needed for allergies. 09/15/23   Anders Simmonds, [name]  clopidogrel (PLAVIX) 75 MG tablet Take 1 tablet (75 mg total) by mouth daily. 05/30/23   Hoy Register, MD  dapagliflozin propanediol (FARXIGA) 10 MG TABS tablet Take 1 tablet (10 mg total) by mouth daily before breakfast. 05/24/23   Perlie Gold, [name]  dicyclomine (BENTYL) 10 MG capsule Take 4 times daily 30 minutes before meals and at bedtime. 08/24/23   Unk Lightning, PA  diphenoxylate-atropine (LOMOTIL) 2.5-0.025 MG tablet TAKE ONE TABLET BY MOUTH TWICE DAILY 03/17/23    Hoy Register, MD  fenofibrate (TRICOR) 145 MG tablet Take 1 tablet (145 mg total) by mouth daily. 09/15/23   Anders Simmonds, [name]  FLUoxetine (PROZAC) 40 MG capsule TAKE ONE CAPSULE BY MOUTH AT BEDTIME 03/18/23   Hoy Register, MD  hydrOXYzine (ATARAX) 10 MG tablet TAKE ONE TABLET BY MOUTH THREE TIMES DAILY AS NEEDED 06/02/23   Hoy Register, MD  insulin aspart (NOVOLOG FLEXPEN) 100 UNIT/ML FlexPen Inject 12 Units into the skin 3 (three) times daily with meals. 09/15/23   Anders Simmonds, [name]  insulin glargine (LANTUS SOLOSTAR) 100 UNIT/ML Solostar Pen Inject 35 Units into the skin at bedtime. 09/15/23   Anders Simmonds, [name]  Insulin Pen Needle (BD PEN NEEDLE NANO 2ND GEN) 32G X 4 MM MISC Inject 1 each into the skin as directed. 09/15/23   Anders Simmonds, [name]  Lancet Devices [facility]) lancets Use as instructed daily. 11/24/16   Hoy Register, MD  meclizine (ANTIVERT) 25 MG tablet TAKE 1 TABLET(25 MG) BY MOUTH THREE TIMES DAILY AS NEEDED FOR DIZZINESS 08/10/23   Hoy Register, MD  metoprolol succinate (TOPROL-XL) 25 MG 24 hr tablet Take 1 tablet (25 mg total) by mouth daily. 08/25/23   Hoy Register, MD  mupirocin ointment (BACTROBAN) 2 % Apply 1 Application topically 2 (two) times daily. Patient taking differently: Apply 1 Application topically daily as needed (for folliculitis). 06/03/22   Hoy Register, MD  omeprazole (PRILOSEC) 40 MG capsule TAKE 1 CAPSULE(40 MG) BY MOUTH DAILY 08/10/23   Hoy Register, MD  pregabalin (LYRICA) 150 MG capsule TAKE 1 CAPSULE(150 MG) BY MOUTH TWICE DAILY 08/10/23   Windell Norfolk, MD  promethazine (PHENERGAN) 25 MG tablet 1/2-1 tab every 4 to 6 hours prn nausea 03/08/22   Hoy Register, MD  rosuvastatin (CRESTOR) 20 MG tablet Take 1 tablet (20 mg total) by mouth daily. 08/25/23 08/24/24  Hoy Register, MD  SPIRIVA RESPIMAT 1.25 MCG/ACT AERS INHALE 2 PUFFS INTO THE LUNGS DAILY 06/25/23   Leslye Peer, MD  spironolactone (ALDACTONE) 25  MG tablet Take 0.5 tablets (12.5 mg total) by mouth daily. 08/25/23 11/23/23  Hoy Register, MD    ROS: Neg resp Neg cardiac Neg GI Neg GU Neg MS Neg psych Neg neuro  Objective:   Vitals:   09/15/23 1504  BP: 109/65  Pulse: 87  SpO2: 96%  Weight: 159 lb (72.1 kg)   Exam General appearance : Awake, alert, not in any distress. Speech Clear. Not toxic looking, appears thin and in overall poor health. HEENT: Atraumatic and Normocephalic, pupils equally reactive to light and accomodation, nose is congested, some sneezing in room.   Neck: Supple, no JVD. No cervical lymphadenopathy.  Chest: Good air entry bilaterally, CTAB.  No  rales/rhonchi/wheezing CVS: S1 S2 regular, no murmurs.  Extremities: B/L Lower Ext shows no edema, both legs are warm to touch Neurology: Awake alert, and oriented X 3, CN II-XII intact, Non focal Skin: No Rash  Data Review Lab Results  Component Value Date   HGBA1C 10.1 (A) 09/15/2023   HGBA1C 11.4 (H) 04/13/2023   HGBA1C 8.4 (H) 06/03/2022    Assessment & Plan   1. Type 2 diabetes mellitus with hyperglycemia, with long-term current use of insulin (HCC) (Primary) I am increasing lantus by 5 units daily and recommended alerts for remembering quick acting dose and checking blood sugars regularly.  I doubt compliance is at 90% - Glucose (CBG) - HgB A1c - insulin glargine (LANTUS SOLOSTAR) 100 UNIT/ML Solostar Pen; Inject 35 Units into the skin at bedtime.  Dispense: 15 mL; Refill: 2 - insulin aspart (NOVOLOG FLEXPEN) 100 UNIT/ML FlexPen; Inject 12 Units into the skin 3 (three) times daily with meals.  Dispense: 15 mL; Refill: 3 - fenofibrate (TRICOR) 145 MG tablet; Take 1 tablet (145 mg total) by mouth daily.  Dispense: 90 tablet; Refill: 0 - Insulin Pen Needle (BD PEN NEEDLE NANO 2ND GEN) 32G X 4 MM MISC; Inject 1 each into the skin as directed.  Dispense: 400 each; Refill: 2 - CBC with Differential - Comprehensive metabolic panel  2. Long term  (current) use of insulin (HCC)   3. Upper respiratory tract infection, unspecified type Fluids rest and respiratory care - cetirizine (ZYRTEC) 10 MG tablet; Take 1 tablet (10 mg total) by mouth daily as needed for allergies.  Dispense: 90 tablet; Refill: 3 - albuterol (VENTOLIN HFA) 108 (90 Base) MCG/ACT inhaler; Inhale 2 puffs into the lungs every 4 (four) hours as needed for wheezing or shortness of breath.  Dispense: 1 each; Refill: 0 - POC Covid19/Flu A&B Antigen  4. Vaginal itching Likely yeast due to #1 - fluconazole (DIFLUCAN) 150 MG tablet; Take 1 tablet (150 mg total) by mouth once for 1 dose. Repeat in 1 week  Dispense: 2 tablet; Refill: 0  5. Smoking greater than 20 pack years Cessation advised    Return for Luke-1 month for DM;  3 months for PCP.  The patient was given clear instructions to go to ER or return to medical center if symptoms don't improve, worsen or new problems develop. The patient verbalized understanding. The patient was told to call to get lab results if they haven't heard anything in the next week.      Georgian Co, [name] [facility] [place], [place] [phone]   09/16/2023, 12:54 PM

## 2023-09-15 NOTE — Patient Instructions (Signed)
 Check blood sugars fasting and at bedtime.  Make sure you are drinking adequate water daily

## 2023-09-16 ENCOUNTER — Encounter

## 2023-09-16 ENCOUNTER — Encounter: Payer: Self-pay | Admitting: Physician Assistant

## 2023-09-16 DIAGNOSIS — K580 Irritable bowel syndrome with diarrhea: Secondary | ICD-10-CM

## 2023-09-16 DIAGNOSIS — R1030 Lower abdominal pain, unspecified: Secondary | ICD-10-CM

## 2023-09-16 DIAGNOSIS — K529 Noninfective gastroenteritis and colitis, unspecified: Secondary | ICD-10-CM

## 2023-09-16 LAB — CBC WITH DIFFERENTIAL/PLATELET
Basophils Absolute: 0 10*3/uL (ref 0.0–0.2)
Basos: 1 %
EOS (ABSOLUTE): 0.3 10*3/uL (ref 0.0–0.4)
Eos: 4 %
Hematocrit: 41.3 % (ref 34.0–46.6)
Hemoglobin: 13.1 g/dL (ref 11.1–15.9)
Immature Grans (Abs): 0 10*3/uL (ref 0.0–0.1)
Immature Granulocytes: 0 %
Lymphocytes Absolute: 1.9 10*3/uL (ref 0.7–3.1)
Lymphs: 25 %
MCH: 27.7 pg (ref 26.6–33.0)
MCHC: 31.7 g/dL (ref 31.5–35.7)
MCV: 87 fL (ref 79–97)
Monocytes Absolute: 0.5 10*3/uL (ref 0.1–0.9)
Monocytes: 7 %
Neutrophils Absolute: 4.7 10*3/uL (ref 1.4–7.0)
Neutrophils: 63 %
Platelets: 154 10*3/uL (ref 150–450)
RBC: 4.73 x10E6/uL (ref 3.77–5.28)
RDW: 13.1 % (ref 11.7–15.4)
WBC: 7.5 10*3/uL (ref 3.4–10.8)

## 2023-09-16 LAB — COMPREHENSIVE METABOLIC PANEL
ALT: 12 IU/L (ref 0–32)
AST: 18 IU/L (ref 0–40)
Albumin: 3.9 g/dL (ref 3.9–4.9)
Alkaline Phosphatase: 80 IU/L (ref 44–121)
BUN/Creatinine Ratio: 13 (ref 12–28)
BUN: 9 mg/dL (ref 8–27)
Bilirubin Total: <0.2 mg/dL (ref 0.0–1.2)
CO2: 21 mmol/L (ref 20–29)
Calcium: 9.2 mg/dL (ref 8.7–10.3)
Chloride: 107 mmol/L — ABNORMAL HIGH (ref 96–106)
Creatinine, Ser: 0.71 mg/dL (ref 0.57–1.00)
Globulin, Total: 2.6 g/dL (ref 1.5–4.5)
Glucose: 213 mg/dL — ABNORMAL HIGH (ref 70–99)
Potassium: 3.8 mmol/L (ref 3.5–5.2)
Sodium: 142 mmol/L (ref 134–144)
Total Protein: 6.5 g/dL (ref 6.0–8.5)
eGFR: 96 mL/min/{1.73_m2} (ref >59)

## 2023-09-19 LAB — FECAL LACTOFERRIN, QUANT
Fecal Lactoferrin: NEGATIVE
MICRO NUMBER:: 16231830
SPECIMEN QUALITY:: ADEQUATE

## 2023-09-19 LAB — CALPROTECTIN, FECAL: Calprotectin, Fecal: 214 ug/g — ABNORMAL HIGH (ref 0–120)

## 2023-09-20 LAB — GI PROFILE, STOOL, PCR

## 2023-09-20 NOTE — Addendum Note (Signed)
 Addended by: Myrtie Hawk on: 09/20/2023 02:24 PM   Modules accepted: Orders

## 2023-09-22 LAB — PANCREATIC ELASTASE, FECAL: Pancreatic Elastase-1, Stool: 748 ug/g (ref >200)

## 2023-09-24 ENCOUNTER — Other Ambulatory Visit: Payer: Self-pay | Admitting: Internal Medicine

## 2023-09-26 ENCOUNTER — Telehealth: Payer: Self-pay

## 2023-09-26 ENCOUNTER — Other Ambulatory Visit: Payer: Self-pay | Admitting: Diagnostic Neuroimaging

## 2023-09-26 NOTE — Telephone Encounter (Signed)
 Alert received from [employer] for AT event 3/29, duration 6hrs , mean HR 117 - Route to triage for first AT event.  Patient reports she was helping her daughter moving stuff. Patient reports dizziness/lightheaded at times. Missed dose of Toprol-XL 25 last week. Stress compliance of taking Toprol- XL daily as missing a dose could cause increased heart rates. Pt voiced understanding. Patient does not check BP regularly, encouraged to do so. Also encouraged adequate hydration on active days outside.   Routing to Dr. Jimmey Ralph as this was patients first tachy. 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   Patient aware to call if symptoms arise in the future.

## 2023-09-30 NOTE — Progress Notes (Signed)
 Bsx Loop Recorder

## 2023-10-03 ENCOUNTER — Ambulatory Visit (INDEPENDENT_AMBULATORY_CARE_PROVIDER_SITE_OTHER): Payer: Medicaid Other

## 2023-10-03 ENCOUNTER — Other Ambulatory Visit: Payer: Self-pay | Admitting: Physician Assistant

## 2023-10-03 DIAGNOSIS — E1165 Type 2 diabetes mellitus with hyperglycemia: Secondary | ICD-10-CM

## 2023-10-03 DIAGNOSIS — G459 Transient cerebral ischemic attack, unspecified: Secondary | ICD-10-CM | POA: Diagnosis not present

## 2023-10-03 MED ORDER — DEXCOM G6 SENSOR MISC: 1.0000 | 99 refills | Status: DC

## 2023-10-03 MED ORDER — DEXCOM G6 RECEIVER DEVI
1.0000 | 0 refills | Status: DC
Start: 2023-10-03 — End: 2023-11-15

## 2023-10-03 MED ORDER — DEXCOM G6 TRANSMITTER MISC: 1.0000 | 0 refills | Status: DC

## 2023-10-04 ENCOUNTER — Other Ambulatory Visit: Payer: Self-pay

## 2023-10-04 LAB — CUP PACEART REMOTE DEVICE CHECK
Date Time Interrogation Session: 20250407015600
Implantable Pulse Generator Implant Date: 20241227
Pulse Gen Serial Number: 124096

## 2023-10-04 NOTE — Telephone Encounter (Signed)
Can we start a PA for patient ?

## 2023-10-05 ENCOUNTER — Other Ambulatory Visit: Payer: Self-pay

## 2023-10-05 ENCOUNTER — Other Ambulatory Visit: Payer: Self-pay | Admitting: Family Medicine

## 2023-10-05 DIAGNOSIS — H8113 Benign paroxysmal vertigo, bilateral: Secondary | ICD-10-CM

## 2023-10-05 MED ORDER — OMEPRAZOLE 40 MG PO CPDR: 40.0000 mg | DELAYED_RELEASE_CAPSULE | Freq: Every day | ORAL | 0 refills | Status: DC

## 2023-10-05 MED ORDER — MECLIZINE HCL 25 MG PO TABS
25.0000 mg | ORAL_TABLET | Freq: Three times a day (TID) | ORAL | 0 refills | Status: DC
Start: 2023-10-05 — End: 2024-01-17

## 2023-10-07 ENCOUNTER — Other Ambulatory Visit: Payer: Self-pay

## 2023-10-09 ENCOUNTER — Other Ambulatory Visit: Payer: Self-pay | Admitting: Family Medicine

## 2023-10-09 DIAGNOSIS — F419 Anxiety disorder, unspecified: Secondary | ICD-10-CM

## 2023-10-20 ENCOUNTER — Ambulatory Visit: Admitting: Pharmacist

## 2023-10-25 ENCOUNTER — Encounter: Payer: Self-pay | Admitting: Family Medicine

## 2023-11-01 ENCOUNTER — Other Ambulatory Visit

## 2023-11-01 ENCOUNTER — Other Ambulatory Visit: Payer: Self-pay | Admitting: Physician Assistant

## 2023-11-01 DIAGNOSIS — J069 Acute upper respiratory infection, unspecified: Secondary | ICD-10-CM

## 2023-11-02 ENCOUNTER — Other Ambulatory Visit

## 2023-11-02 DIAGNOSIS — R1030 Lower abdominal pain, unspecified: Secondary | ICD-10-CM

## 2023-11-02 DIAGNOSIS — K529 Noninfective gastroenteritis and colitis, unspecified: Secondary | ICD-10-CM

## 2023-11-02 DIAGNOSIS — K580 Irritable bowel syndrome with diarrhea: Secondary | ICD-10-CM

## 2023-11-03 ENCOUNTER — Other Ambulatory Visit: Payer: Self-pay

## 2023-11-03 ENCOUNTER — Other Ambulatory Visit: Payer: Self-pay | Admitting: Internal Medicine

## 2023-11-03 ENCOUNTER — Telehealth: Payer: Self-pay

## 2023-11-03 DIAGNOSIS — K580 Irritable bowel syndrome with diarrhea: Secondary | ICD-10-CM

## 2023-11-03 LAB — GI PROFILE, STOOL, PCR

## 2023-11-03 MED ORDER — VANCOMYCIN HCL 125 MG PO CAPS: 125.0000 mg | ORAL_CAPSULE | Freq: Four times a day (QID) | ORAL | 0 refills | Status: AC

## 2023-11-03 MED ORDER — DIPHENOXYLATE-ATROPINE 2.5-0.025 MG PO TABS: 1.0000 | ORAL_TABLET | Freq: Two times a day (BID) | ORAL | 0 refills | Status: DC | PRN

## 2023-11-03 MED ORDER — DICYCLOMINE HCL 10 MG PO CAPS: ORAL_CAPSULE | ORAL | 5 refills | Status: DC

## 2023-11-03 NOTE — Telephone Encounter (Signed)
 Patient contacted and advised. Briefly discussed personal hygiene, self-care and precautions. Pharmacy confirmed. Patient agrees to this plan of care.

## 2023-11-03 NOTE — Telephone Encounter (Signed)
 [facility] calls. Positive C Difficile toxin A/B Please advise.

## 2023-11-07 ENCOUNTER — Ambulatory Visit: Payer: Medicaid Other

## 2023-11-07 DIAGNOSIS — G459 Transient cerebral ischemic attack, unspecified: Secondary | ICD-10-CM | POA: Diagnosis not present

## 2023-11-08 ENCOUNTER — Other Ambulatory Visit: Payer: Self-pay | Admitting: *Deleted

## 2023-11-08 DIAGNOSIS — K580 Irritable bowel syndrome with diarrhea: Secondary | ICD-10-CM

## 2023-11-08 LAB — CUP PACEART REMOTE DEVICE CHECK
Date Time Interrogation Session: 20250512064300
Implantable Pulse Generator Implant Date: 20241227
Pulse Gen Serial Number: 124096

## 2023-11-08 MED ORDER — DICYCLOMINE HCL 10 MG PO CAPS: ORAL_CAPSULE | ORAL | 5 refills | Status: DC

## 2023-11-09 ENCOUNTER — Ambulatory Visit: Payer: Self-pay | Admitting: Cardiology

## 2023-11-10 ENCOUNTER — Encounter: Payer: Self-pay | Admitting: Family Medicine

## 2023-11-10 DIAGNOSIS — K580 Irritable bowel syndrome with diarrhea: Secondary | ICD-10-CM

## 2023-11-11 ENCOUNTER — Other Ambulatory Visit: Payer: Self-pay | Admitting: Internal Medicine

## 2023-11-11 MED ORDER — DICYCLOMINE HCL 10 MG PO CAPS: ORAL_CAPSULE | ORAL | 5 refills | Status: AC

## 2023-11-11 NOTE — Addendum Note (Signed)
 Addended by: Cain Castillo on: 11/11/2023 03:26 PM   Modules accepted: Orders

## 2023-11-14 NOTE — Telephone Encounter (Signed)
 Patient states that she does not see her Endocrinologist anymore.

## 2023-11-15 ENCOUNTER — Ambulatory Visit: Payer: Medicaid Other | Admitting: Neurology

## 2023-11-15 ENCOUNTER — Other Ambulatory Visit: Payer: Self-pay | Admitting: Family Medicine

## 2023-11-15 ENCOUNTER — Encounter: Payer: Self-pay | Admitting: Neurology

## 2023-11-15 VITALS — BP 127/78 | HR 96 | Ht 67.0 in | Wt 156.0 lb

## 2023-11-15 DIAGNOSIS — G43909 Migraine, unspecified, not intractable, without status migrainosus: Secondary | ICD-10-CM

## 2023-11-15 DIAGNOSIS — M792 Neuralgia and neuritis, unspecified: Secondary | ICD-10-CM

## 2023-11-15 DIAGNOSIS — I639 Cerebral infarction, unspecified: Secondary | ICD-10-CM | POA: Diagnosis not present

## 2023-11-15 DIAGNOSIS — B0229 Other postherpetic nervous system involvement: Secondary | ICD-10-CM

## 2023-11-15 DIAGNOSIS — R269 Unspecified abnormalities of gait and mobility: Secondary | ICD-10-CM | POA: Diagnosis not present

## 2023-11-15 DIAGNOSIS — E1165 Type 2 diabetes mellitus with hyperglycemia: Secondary | ICD-10-CM

## 2023-11-15 DIAGNOSIS — R413 Other amnesia: Secondary | ICD-10-CM

## 2023-11-15 DIAGNOSIS — R42 Dizziness and giddiness: Secondary | ICD-10-CM

## 2023-11-15 MED ORDER — AMITRIPTYLINE HCL 75 MG PO TABS: 75.0000 mg | ORAL_TABLET | Freq: Every day | ORAL | 3 refills | Status: AC

## 2023-11-15 MED ORDER — LANTUS SOLOSTAR 100 UNIT/ML ~~LOC~~ SOPN: 35.0000 [IU] | PEN_INJECTOR | Freq: Every day | SUBCUTANEOUS | 2 refills | Status: DC

## 2023-11-15 MED ORDER — NOVOLOG FLEXPEN 100 UNIT/ML ~~LOC~~ SOPN: 12.0000 [IU] | PEN_INJECTOR | Freq: Three times a day (TID) | SUBCUTANEOUS | 3 refills | Status: DC

## 2023-11-15 MED ORDER — NURTEC 75 MG PO TBDP: 75.0000 mg | ORAL_TABLET | ORAL | 11 refills | Status: DC

## 2023-11-15 MED ORDER — PREGABALIN 150 MG PO CAPS: 150.0000 mg | ORAL_CAPSULE | Freq: Two times a day (BID) | ORAL | 5 refills | Status: DC

## 2023-11-15 MED ORDER — ACCU-CHEK GUIDE TEST VI STRP: ORAL_STRIP | 3 refills | Status: DC

## 2023-11-15 NOTE — Progress Notes (Signed)
 [facility]  PATIENT: [name] DOB: March 12, 1961  REQUESTING CLINICIAN: Joaquin Mulberry, MD HISTORY FROM: Patient  REASON FOR VISIT: Headaches/Dizziness/Neuropathic pain    HISTORICAL  CHIEF COMPLAINT:  Chief Complaint  Patient presents with   Follow-up    Rm12, daughter present ([name]), Nonintractable headache Postherpetic neuralgia CVA: currently has cdiff. Pt stated that the pregabalin  isn't working and would like to go on brand lyrica . 7 headaches in past 30 days, 0/30 days of migraines ,triggers: stress/pain. Pt stated that she has issues since stroke with balance and memory. Moca 24    INTERVAL HISTORY 11/15/2023 Patient presents today for follow-up, last visit was 6 months ago, she is accompanied by her daughter.  She tells me that since last visit she has episodes of feeling unsteady, fell 1 morning when getting up to use the bathroom, denies tripping and falling., no major injury and did not go to the hospital. In term of the headaches, they are better controlled with amitriptyline , currently she is having 3 headaches a week and Tylenol  helps.    In terms of her postherpetic neuralgia and neuropathy, she tells me that the pregabalin  has not been helpful, she had better relief when she was on brand-name Lyrica  and would like to go back on brand-name Lyrica .  In term of the stroke, she is doing well, her cardiac monitor did not show evidence of atrial fibrillation, she remains on Plavix .   Her main complaint today is forgetfulness, she feels like she is very repetitive, asking the same questions over and over, have difficulty with her short-term memory.  Her long-term memory is intact.  Her daughter tells me that patient has been losing money, leaving front door open, leaving the freezer door open ruining the food.  She is still independent in all activities of daily living.   INTERVAL HISTORY 04/21/2023:  Patient presents today for follow-up, she  is accompanied by her daughter, last visit was in February at that time we started her on amitriptyline  for headache and Lyrica  for post herpetic neuralgia.  She reports on October 15 she presented to the hospital because she woke up with facial numbness and right arm numbness lasting for 30 minutes.  In the ED she was found to have multiple strokes in the left postcentral gyrus, right cerebellum and right parietal cortex.  Stroke etiology likely cardioembolic.  Her EF was 45 to 50% with left ventricle global hypokinesis but she was not found to be in A-fib.  She was recommended a cardiac monitor which has not been completed.  She is pending her follow-up with cardiology.  From the strokes, she denies any deficit as her symptoms only lasted for 30 minutes.  She tells me that her headaches are still present, having 4-5 headaches per day.  Amitriptyline  is not helpful.   Hospital Course and Summary: [name] is a 63 y.o. female with medical history significant of insulin -dependent type 2 diabetes, hypertension, HFpEF, anxiety, depression, GERD, IBS with diarrhea, cervical radiculopathy, left shoulder adhesive capsulitis, neuropathy, COPD presented to the ED  with transient numbness to her whole face and right arm lasting approximately 30 to minutes before resolution. Patient reports having intermittent palpitations over the past few years with an increase in frequency recently each lasting approximately 3 to 4 minutes.  Patient reports having cardiac palpitations prior to onset of presenting symptoms.  Mori significant for acute CVA, admitted for workup, please see discussion below Acute Ischemic Infarct:  -Small acute infarcts in  the right cerebellum, right parietal cortex, and left postcentral gyrus, etiology: Likely cardioembolic in nature  -CT head without contrast 10/16: No acute intracranial abnormality -CTA head & neck unremarkable -MRI small acute infarcts in the right cerebellum, right  parietal cortex, and left precentral gyrus.  Remote small vessel infarcts seen along the cerebral cortex and cerebellum, question risk factors for recurrent embolic disease. -2D Echo LVEF 45-50%, left ventricular global hypokinesis. -LDL 79 -HgbA1c 11.4 -With greatly appreciated, recommendation to start Plavix  due to intolerance to aspirin .   -Cardiology input greatly appreciated, she has not had any arrhythmia/afib this admission.  Cardiology favor 30 day monitor then possible ILR as outpatient to check for PAF   INTERVAL HISTORY 08/23/2022:  Patient presents today for follow-up, last visit was in October.  At that time plan was to continue with amitriptyline  25 mg at night and Lyrica  150 mg twice daily.  She reports now that those 2 medications are not helpful.  She still having occasional headache and neuropathic pain.  She also report increased stress related to her sister's death.  She feels that her gait is still abnormal, she sways when she walk, denies any falls.   INTERVAL HISTORY 04/20/22: Patient presents today for follow-up, last visit was in July, at that time we started her on amitriptyline  for headaches preventive medication and also Lyrica  for the neuropathy.  Patient reports the combination of amitriptyline  and Lyrica  is very beneficial, her migraines are well controlled and her neuropathy symptoms are also well controlled with the Lyrica  75/150. She also reports that her dizziness improved.  She does report increased stress, that her sister is in admitted in the ICU and then she is taking care of her and also her niece (sister son) who was diagnosed with cerebral palsy.   HISTORY OF PRESENT ILLNESS:  This is a 63 year old woman past medical history of COPD, diabetes mellitus, neuropathy, depression, hypertension and hyperlipidemia who is presenting with complaint of dizziness, that she described as room spinning sensation, can last 3 seconds up to a minute.  During this time, she  feels really scared, and has nausea.  On top of that she also complained of headaches, reported daily headache, sometimes she can wake up with headaches.  Described headache as aching pain.  In the past, she had tried Amitriptyline  with good results with the headaches.  Patient also complain of neuropathic pain mostly on the left side.  She did have a shingles outbreak on the left side of her back and now in the same region she has itchiness, numbness and stabbing pain.  She reported her neuropathic pain got worse since starting her insulin  and controlling her diabetes.  Currently her A1c went from 13 to almost 7.     OTHER MEDICAL CONDITIONS: COPD PAD, hypertension, hyperlipidemia, diabetes mellitus, depression and neuropathy, shingles   REVIEW OF SYSTEMS: Full 14 system review of systems performed and negative with exception of: as noted in the HPI   ALLERGIES: Allergies  Allergen Reactions   Biaxin [Clarithromycin] Shortness Of Breath and Swelling   Lisinopril Swelling and Cough    Lip edema   Sulfa Antibiotics Anaphylaxis, Shortness Of Breath, Swelling and Hypertension   Chlorthalidone Nausea And Vomiting and Other (See Comments)    Clammy, Tachycardia, Headache    Daptomycin Nausea And Vomiting   Latex Hives and Rash   Amoxicillin -Pot Clavulanate Diarrhea    Has patient had a PCN reaction causing immediate rash, facial/tongue/throat swelling, SOB or lightheadedness with hypotension:No Has  patient had a PCN reaction causing severe rash involving mucus membranes or skin necrosis:No Has patient had a PCN reaction that required hospitalization:No Has patient had a PCN reaction occurring within THE LAST 10 YEARS.  #  #  #  YES  #  #  #  If all of the above answers are "NO", then may proceed with Cephalosporin use.    Aspirin  Nausea And Vomiting and Rash    On 325mg  dosage   Cholestyramine Nausea And Vomiting   Dilaudid  [Hydromorphone  Hcl] Nausea And Vomiting   Lasix [Furosemide]  Nausea And Vomiting and Other (See Comments)    Headache     HOME MEDICATIONS: Outpatient Medications Prior to Visit  Medication Sig Dispense Refill   ACCU-CHEK GUIDE test strip USE AS DIRECTED IN THE MORNING, AT NOON, IN THE EVENING AND AT BEDTIME 400 strip 3   Accu-Chek Softclix Lancets lancets Use to check blood sugar THREE TIMES DAILY 100 each 2   albuterol  (VENTOLIN  HFA) 108 (90 Base) MCG/ACT inhaler INHALE 2 PUFFS INTO THE LUNGS EVERY 4 HOURS AS NEEDED FOR WHEEZING OR SHORTNESS OF BREATH 54 g 0   Blood Glucose Monitoring Suppl (ACCU-CHEK GUIDE ME) w/Device KIT Use to check blood sugar TID. 1 kit 0   cetirizine  (ZYRTEC ) 10 MG tablet Take 1 tablet (10 mg total) by mouth daily as needed for allergies. 90 tablet 3   clopidogrel  (PLAVIX ) 75 MG tablet Take 1 tablet (75 mg total) by mouth daily. 90 tablet 1   dapagliflozin  propanediol (FARXIGA ) 10 MG TABS tablet Take 1 tablet (10 mg total) by mouth daily before breakfast. 90 tablet 3   dicyclomine  (BENTYL ) 10 MG capsule Take 4 times daily 30 minutes before meals and at bedtime. 120 capsule 5   diphenoxylate -atropine  (LOMOTIL ) 2.5-0.025 MG tablet Take 1 tablet by mouth 2 (two) times daily as needed for diarrhea or loose stools. 60 tablet 0   fenofibrate  (TRICOR ) 145 MG tablet Take 1 tablet (145 mg total) by mouth daily. 90 tablet 0   FLUoxetine  (PROZAC ) 40 MG capsule TAKE 1 CAPSULE(40 MG) BY MOUTH AT BEDTIME 90 capsule 1   hydrOXYzine  (ATARAX ) 10 MG tablet TAKE ONE TABLET BY MOUTH THREE TIMES DAILY AS NEEDED 90 tablet 2   insulin  aspart (NOVOLOG  FLEXPEN) 100 UNIT/ML FlexPen Inject 12 Units into the skin 3 (three) times daily with meals. (Patient taking differently: Inject 15 Units into the skin 3 (three) times daily with meals.) 15 mL 3   insulin  glargine (LANTUS  SOLOSTAR) 100 UNIT/ML Solostar Pen Inject 35 Units into the skin at bedtime. 15 mL 2   Insulin  Pen Needle (BD PEN NEEDLE NANO 2ND GEN) 32G X 4 MM MISC Inject 1 each into the skin as  directed. 400 each 2   meclizine  (ANTIVERT ) 25 MG tablet Take 1 tablet (25 mg total) by mouth 3 (three) times daily. 90 tablet 0   metoprolol  succinate (TOPROL -XL) 25 MG 24 hr tablet Take 1 tablet (25 mg total) by mouth daily. 90 tablet 1   mupirocin  ointment (BACTROBAN ) 2 % Apply 1 Application topically 2 (two) times daily. (Patient taking differently: Apply 1 Application topically daily as needed (for folliculitis).) 22 g 1   omeprazole  (PRILOSEC) 40 MG capsule TAKE 1 CAPSULE(40 MG) BY MOUTH DAILY 90 capsule 0   promethazine  (PHENERGAN ) 25 MG tablet 1/2-1 tab every 4 to 6 hours prn nausea 20 tablet 0   rosuvastatin  (CRESTOR ) 20 MG tablet Take 1 tablet (20 mg total) by mouth daily. 90 tablet  1   SPIRIVA  RESPIMAT 1.25 MCG/ACT AERS INHALE 2 PUFFS INTO THE LUNGS DAILY 4 g 5   spironolactone  (ALDACTONE ) 25 MG tablet Take 0.5 tablets (12.5 mg total) by mouth daily. 45 tablet 1   vancomycin  (VANCOCIN ) 125 MG capsule Take 125 mg by mouth 4 (four) times daily.     amitriptyline  (ELAVIL ) 50 MG tablet TAKE 1 TABLET(50 MG) BY MOUTH AT BEDTIME 90 tablet 3   pregabalin  (LYRICA ) 150 MG capsule TAKE ONE CAPSULE BY MOUTH TWICE DAILY 60 capsule 5   Continuous Glucose Receiver (DEXCOM G6 RECEIVER) DEVI 1 each by Does not apply route as directed. (Patient not taking: Reported on 11/15/2023) 1 each 0   Continuous Glucose Sensor (DEXCOM G6 SENSOR) MISC 1 each by Does not apply route as directed. (Patient not taking: Reported on 11/15/2023) 3 each PRN   Continuous Glucose Transmitter (DEXCOM G6 TRANSMITTER) MISC 1 each by Does not apply route as directed. (Patient not taking: Reported on 11/15/2023) 1 each 0   glucose blood (ACCU-CHEK GUIDE TEST) test strip Use as instructed, morning, noon, evening and bedtime 400 each 3   Lancet Devices (ACCU-CHEK SOFTCLIX) lancets Use as instructed daily. 1 each 5   Facility-Administered Medications Prior to Visit  Medication Dose Route Frequency Provider Last Rate Last Admin    regadenoson  (LEXISCAN ) injection SOLN 0.4 mg  0.4 mg Intravenous Once Nelson, Katarina H, MD       technetium tetrofosmin  (TC-MYOVIEW ) injection 32.8 millicurie  32.8 millicurie Intravenous Once PRN Liza Riggers, MD        PAST MEDICAL HISTORY: Past Medical History:  Diagnosis Date   Anxiety    Arthritis    Chronic systolic heart failure (HCC)    EF 20-25% ECHO 05/2015   COPD (chronic obstructive pulmonary disease) (HCC)    Depression    Diabetes mellitus without complication (HCC)    Type II   Dysrhythmia    pt unsure of name of arrythmia - " my heart rate will drop all of a sudden" -no current treatment - atrial flutter   Fibrillation, atrial (HCC)    Gallstones    GERD (gastroesophageal reflux disease)    Headache(784.0)    migraines   Hypertension    Osteomyelitis (HCC)    R ankle   Rash    arms    PAST SURGICAL HISTORY: Past Surgical History:  Procedure Laterality Date   ANKLE FRACTURE SURGERY Right 2015   CARDIAC CATHETERIZATION N/A 06/24/2015   Procedure: Left Heart Cath and Coronary Angiography;  Surgeon: Arty Binning, MD;  Location[facility] INVASIVE CV LAB;  Service: Cardiovascular;  Laterality: N/A;   CARPAL TUNNEL RELEASE     bil   CHOLECYSTECTOMY  11/24/2011   Procedure: LAPAROSCOPIC CHOLECYSTECTOMY WITH INTRAOPERATIVE CHOLANGIOGRAM;  Surgeon: Brandy Cal. Cornett, MD;  Location: [facility] ORS;  Service: General;  Laterality: N/A;  Laparoscopic Cholecystectomy with Cholangiogram   COLONOSCOPY     ECTOPIC PREGNANCY SURGERY  many yrs ago   HARDWARE REMOVAL Right 07/15/2015   Procedure: RIGHT ANKLE HARDWARE REMOVAL, PLACEMENT OF STIMULAN ANTIBIOTIC BEADS AND PREVENA WOUND VAC.;  Surgeon: Jasmine Mesi, MD;  Location: [facility] OR;  Service: Orthopedics;  Laterality: Right;   TOOTH EXTRACTION N/A 12/03/2016   Procedure: DENTAL EXTRACTIONS with Alveoloplasty;  Surgeon: Ascencion Lava, DDS;  Location: [facility] OR;  Service: Oral Surgery;  Laterality: N/A;    FAMILY HISTORY: Family  History  Problem Relation Age of Onset   Diabetes Mother    Lung cancer Father  Heart attack Father 65       CABG x4   Breast cancer Sister        Survivor     SOCIAL HISTORY: Social History   Socioeconomic History   Marital status: Divorced    Spouse name: Not on file   Number of children: Not on file   Years of education: Not on file   Highest education level: Not on file  Occupational History   Not on file  Tobacco Use   Smoking status: Every Day    Current packs/day: 1.00    Average packs/day: 1 pack/day for 20.9 years (20.9 ttl pk-yrs)    Types: Cigarettes, E-cigarettes    Start date: 09/19/2016   Smokeless tobacco: Never   Tobacco comments:    Pt stated smoking 5 cigarettes daily  updated 06/08/2023 amy marsh, cma  Vaping Use   Vaping status: Every Day   Start date: 10/12/2016  Substance and Sexual Activity   Alcohol use: No    Alcohol/week: 0.0 standard drinks of alcohol   Drug use: No   Sexual activity: Not Currently    Birth control/protection: Post-menopausal  Other Topics Concern   Not on file  Social History Narrative   ** Merged History Encounter **    Right handed   Caffeine- 1 cup daily   Social Drivers of Health   Financial Resource Strain: Medium Risk (04/25/2023)   Overall Financial Resource Strain (CARDIA)    Difficulty of Paying Living Expenses: Somewhat hard  Food Insecurity: Food Insecurity Present (04/25/2023)   Hunger Vital Sign    Worried About Running Out of Food in the Last Year: Sometimes true    Ran Out of Food in the Last Year: Sometimes true  Transportation Needs: No Transportation Needs (04/14/2023)   PRAPARE - Administrator, Civil Service (Medical): No    Lack of Transportation (Non-Medical): No  Physical Activity: Insufficiently Active (04/25/2023)   Exercise Vital Sign    Days of Exercise per Week: 3 days    Minutes of Exercise per Session: 10 min  Stress: No Stress Concern Present (04/25/2023)   [employer] of Occupational Health - Occupational Stress Questionnaire    Feeling of Stress : Not at all  Social Connections: Socially Isolated (04/25/2023)   Social Connection and Isolation Panel [NHANES]    Frequency of Communication with Friends and Family: More than three times a week    Frequency of Social Gatherings with Friends and Family: More than three times a week    Attends Religious Services: Never    Database administrator or Organizations: No    Attends Banker Meetings: Never    Marital Status: Divorced  Catering manager Violence: Not At Risk (04/25/2023)   Humiliation, Afraid, Rape, and Kick questionnaire    Fear of Current or Ex-Partner: No    Emotionally Abused: No    Physically Abused: No    Sexually Abused: No    PHYSICAL EXAM  GENERAL EXAM/CONSTITUTIONAL: Vitals:  Vitals:   11/15/23 1347  BP: 127/78  Pulse: 96  Weight: 156 lb (70.8 kg)  Height: 5\' 7"  (1.702 m)   Body mass index is 24.43 kg/m. Wt Readings from Last 3 Encounters:  11/15/23 156 lb (70.8 kg)  09/15/23 159 lb (72.1 kg)  08/24/23 156 lb (70.8 kg)   Patient is in no distress; well developed, nourished and groomed; neck is supple  MUSCULOSKELETAL: Gait, strength, tone, movements noted in Neurologic exam below  NEUROLOGIC: MENTAL STATUS:      No data to display              11/15/2023    1:49 PM  Montreal Cognitive Assessment   Visuospatial/ Executive (0/5) 5  Naming (0/3) 3  Attention: Read list of digits (0/2) 2  Attention: Read list of letters (0/1) 1  Attention: Serial 7 subtraction starting at 100 (0/3) 2  Language: Repeat phrase (0/2) 1  Language : Fluency (0/1) 0  Abstraction (0/2) 2  Delayed Recall (0/5) 2  Orientation (0/6) 6  Total 24    awake, alert, oriented to person, place and time recent and remote memory intact normal attention and concentration language fluent, comprehension intact, naming intact fund of knowledge appropriate  CRANIAL  NERVE:  2nd, 3rd, 4th, 6th - visual fields full to confrontation, extraocular muscles intact, no nystagmus 5th - facial sensation symmetric 7th - facial strength symmetric 8th - hearing intact 9th - palate elevates symmetrically, uvula midline 11th - shoulder shrug symmetric 12th - tongue protrusion midline  MOTOR:  normal bulk and tone, full strength in the BUE, BLE  SENSORY:  normal and symmetric to light touch  COORDINATION:  finger-nose-finger, fine finger movements normal  GAIT/STATION:  Slow wide based, unable to tandem, mild romberg   DIAGNOSTIC DATA (LABS, IMAGING, TESTING) - I reviewed patient records, labs, notes, testing and imaging myself where available.  Lab Results  Component Value Date   WBC 7.5 09/15/2023   HGB 13.1 09/15/2023   HCT 41.3 09/15/2023   MCV 87 09/15/2023   PLT 154 09/15/2023      Component Value Date/Time   NA 142 09/15/2023 1554   K 3.8 09/15/2023 1554   CL 107 (H) 09/15/2023 1554   CO2 21 09/15/2023 1554   GLUCOSE 213 (H) 09/15/2023 1554   GLUCOSE 220 (H) 04/13/2023 0006   BUN 9 09/15/2023 1554   CREATININE 0.71 09/15/2023 1554   CREATININE 0.59 07/05/2016 0947   CALCIUM  9.2 09/15/2023 1554   PROT 6.5 09/15/2023 1554   ALBUMIN 3.9 09/15/2023 1554   AST 18 09/15/2023 1554   ALT 12 09/15/2023 1554   ALKPHOS 80 09/15/2023 1554   BILITOT <0.2 09/15/2023 1554   GFRNONAA >60 04/13/2023 0006   GFRNONAA >89 07/05/2016 0947   GFRAA 114 07/11/2020 1002   GFRAA >89 07/05/2016 0947   Lab Results  Component Value Date   CHOL 96 04/14/2023   HDL 35 (L) 04/14/2023   LDLCALC 43 04/14/2023   TRIG 90 04/14/2023   CHOLHDL 2.7 04/14/2023   Lab Results  Component Value Date   HGBA1C 10.1 (A) 09/15/2023   No results found for: "VITAMINB12" Lab Results  Component Value Date   TSH 0.997 03/09/2021    MRI Brain 01/31/22 Scattered T2/FLAIR hyperintense foci in the hemispheres and cerebellum consistent with mild chronic microvascular  ischemic changes or remote ischemic events.  These are stable compared to the 2016 MRI. Bilateral mastoid effusions.  These are usually caused by eustachian tube dysfunction. No acute findings   MRI Brain 04/13/2023 Small acute infarcts in the right cerebellum, right parietal cortex, and left postcentral gyrus. 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  CTA Head and Neck 04/13/2023 1. No acute intracranial abnormality. The acute infarcts seen on same day brain MRI are below the threshold of CT. 2. No large vessel occlusion, hemodynamically significant stenosis, dissection, or aneurysm in the head or neck.  CT Temporal 03/05/2022 Mild mastoid opacification with negative nasopharynx, seen to some degree on scans over multiple years.   ASSESSMENT AND PLAN  63 y.o. year old female with medical conditions including hypertension, hyperlipidemia, diabetes mellitus, COPD and postherpetic neuralgia who is presenting for follow up for her headaches, neuropathy.  In term of her headaches, she is on amitriptyline  50 mg nightly, still having headaches. Tells me that at one time, she was on Amitriptyline  75 mg and it did help her with sleep. Will increase amitriptyline  to 75 mg and add Nurtec 75 mg every other day.  For her postherpetic neuralgia, tells me that she had better relief with brand name Lyrica , will request Brand name Lyrica .  In term of the strokes, based on multiple vascular territories, etiology likely cardioembolic.  Her cardiac monitor did not show evidence of atrial fibrillation, will continue patient on Plavix . I will see her in 6 months for follow up or sooner if worse     1. Cerebrovascular accident (CVA), unspecified mechanism (HCC)   2. Postherpetic neuralgia   3. Gait abnormality   4. Dizziness   5. Neuropathic pain   6. Episodic migraine   7. Memory loss      Patient Instructions   Increase amitriptyline  to 75 mg nightly Start Nurtec 75 mg every other day Will request brand-name Lyrica  for her herpetic neuralgia Continue other medication including Plavix  daily For her complaint of memory loss, recommend exercise at least 20 minutes daily, start Vitamin B complex and keep a calendar  Follow-up in 6 months or sooner if worse.  No orders of the defined types were placed in this encounter.   Meds ordered this encounter  Medications   amitriptyline  (ELAVIL ) 75 MG tablet    Sig: Take 1 tablet (75 mg total) by mouth at bedtime.    Dispense:  90 tablet    Refill:  3   pregabalin  (LYRICA ) 150 MG capsule    Sig: Take 1 capsule (150 mg total) by mouth 2 (two) times daily.    Dispense:  60 capsule    Refill:  5    BRAND NAME ONLY   Rimegepant Sulfate (NURTEC) 75 MG TBDP    Sig: Take 1 tablet (75 mg total) by mouth every other day.    Dispense:  16 tablet    Refill:  11    Return in about 6 months (around 05/17/2024).   Cassandra Cleveland, MD 11/15/2023, 5:08 PM  [facility] [address], [address] [place], [place] [address] [phone]

## 2023-11-15 NOTE — Patient Instructions (Addendum)
 Increase amitriptyline  to 75 mg nightly Start Nurtec 75 mg every other day Will request brand-name Lyrica  for her herpetic neuralgia Continue other medication including Plavix  daily For her complaint of memory loss, recommend exercise at least 20 minutes daily, start Vitamin B complex and keep a calendar  Follow-up in 6 months or sooner if worse.

## 2023-11-16 ENCOUNTER — Encounter: Payer: Self-pay | Admitting: Family Medicine

## 2023-11-16 ENCOUNTER — Other Ambulatory Visit: Payer: Self-pay | Admitting: Family Medicine

## 2023-11-16 DIAGNOSIS — N9089 Other specified noninflammatory disorders of vulva and perineum: Secondary | ICD-10-CM

## 2023-11-16 MED ORDER — DOXYCYCLINE HYCLATE 100 MG PO TABS: 100.0000 mg | ORAL_TABLET | Freq: Two times a day (BID) | ORAL | 0 refills | Status: DC

## 2023-11-18 ENCOUNTER — Telehealth: Payer: Self-pay

## 2023-11-18 NOTE — Telephone Encounter (Signed)
 Copied from CRM [id number]. Topic: Referral - Status >> Nov 18, 2023  2:38 PM Elle L wrote: Reason for CRM: April with [facility], [phone], called to advised that they received a referral for the patient. However, she advised it should go to an OB/GYN instead of GYN Oncology. She also requested to please delete it from the referral queue.

## 2023-11-18 NOTE — Telephone Encounter (Signed)
 Spoke with [name] at [facility],.  [phone].  Explained this was a referral to gynecology not gyn oncology.  She will pass along to the referral coordinator and have it deleted.

## 2023-11-18 NOTE — Telephone Encounter (Signed)
Referral needs to be changed. 

## 2023-11-19 NOTE — Telephone Encounter (Signed)
 I placed a referral for GYN not GYN Oncology. [name] can you please review? Thanks

## 2023-11-22 NOTE — Telephone Encounter (Signed)
 FYI

## 2023-11-23 ENCOUNTER — Encounter (INDEPENDENT_AMBULATORY_CARE_PROVIDER_SITE_OTHER): Payer: Self-pay

## 2023-11-23 NOTE — Telephone Encounter (Signed)
My-Chart message has been sent to patient.

## 2023-11-23 NOTE — Progress Notes (Signed)
 Bsx Loop Recorder

## 2023-11-23 NOTE — Addendum Note (Signed)
 Addended by: Edra Govern D on: 11/23/2023 10:04 AM   Modules accepted: Orders

## 2023-11-24 ENCOUNTER — Other Ambulatory Visit: Payer: Self-pay | Admitting: Physician Assistant

## 2023-11-24 DIAGNOSIS — I5022 Chronic systolic (congestive) heart failure: Secondary | ICD-10-CM

## 2023-11-24 MED ORDER — SPIRONOLACTONE 25 MG PO TABS: 12.5000 mg | ORAL_TABLET | Freq: Every day | ORAL | 1 refills | Status: DC

## 2023-11-28 ENCOUNTER — Other Ambulatory Visit (HOSPITAL_COMMUNITY): Payer: Self-pay

## 2023-11-28 ENCOUNTER — Telehealth: Payer: Self-pay

## 2023-11-28 ENCOUNTER — Other Ambulatory Visit: Payer: Self-pay | Admitting: Family Medicine

## 2023-11-28 NOTE — Telephone Encounter (Signed)
 Pharmacy Patient Advocate Encounter   Received notification from Fax that prior authorization for Nurtec 75MG  dispersible tablets is required/requested.   Insurance verification completed.   The patient is insured through [facility] MEDICAID .   Per test claim:  The trial and failure of TWO Triptans (Rizatriptan and Sumatriptan  specifically) is preferred by the insurance.  If suggested medication is appropriate, Please send in a new RX and discontinue this one. If not, please advise as to why it's not appropriate so that we may request a Prior Authorization. Please note, some preferred medications may still require a PA.  If the suggested medications have not been trialed and there are no contraindications to their use, the PA will not be submitted, as it will not be approved.  I see PT has tried Sumatriptan  but not Rizatriptan-If PA is submitted without PT having trial and failure of both Triptans or has contraindications the PA will be denied by Medicaid. please advise

## 2023-11-28 NOTE — Telephone Encounter (Signed)
 Please see other telephone encounter dated 11/28/2023

## 2023-11-28 NOTE — Telephone Encounter (Signed)
 Pharmacy Patient Advocate Encounter   Received notification from Fax that prior authorization for Nurtec 75MG  dispersible tablets is required/requested.   Insurance verification completed.   The patient is insured through [facility] MEDICAID .   Per test claim: PA required; PA submitted to above mentioned insurance via CoverMyMeds Key/confirmation #/EOC [id number] Status is pending

## 2023-11-29 ENCOUNTER — Other Ambulatory Visit: Payer: Self-pay | Admitting: Neurology

## 2023-11-29 MED ORDER — ZOLMITRIPTAN 5 MG PO TABS: 5.0000 mg | ORAL_TABLET | ORAL | 3 refills | Status: DC | PRN

## 2023-11-29 MED ORDER — RIZATRIPTAN BENZOATE 10 MG PO TBDP: 10.0000 mg | ORAL_TABLET | ORAL | 11 refills | Status: DC | PRN

## 2023-11-29 NOTE — Telephone Encounter (Signed)
 Rizatriptan sent to pharmacy.

## 2023-11-29 NOTE — Telephone Encounter (Signed)
 Requested medication (s) are due for refill today: yes but is abx  Requested medication (s) are on the active medication list: yes  Last refill:  11/16/23 #20  Future visit scheduled: yes  Notes to clinic:  med not assigned to a protocol   Requested Prescriptions  Pending Prescriptions Disp Refills   doxycycline  (VIBRA -TABS) 100 MG tablet [Pharmacy Med Name: DOXYCYCLINE  HYC 100MG  TABS] 20 tablet 0    Sig: TAKE 1 TABLET(100 MG) BY MOUTH TWICE DAILY     Off-Protocol Failed - 11/29/2023  3:28 PM      Failed - Medication not assigned to a protocol, review manually.      Passed - Valid encounter within last 12 months    Recent Outpatient Visits           2 months ago Type 2 diabetes mellitus with hyperglycemia, with long-term current use of insulin  (HCC)   [facility] [facility] [place]. [facility] [place], Stan E[place], [place]   7 months ago Hospital discharge follow-up   [facility] - A Dept Of [place]. [facility] Lawrance Presume, [place]   8 months ago Neck pain   [place] [facility] [place] - A Dept Of [place]. [facility] Joaquin Mulberry, MD   1 year ago Smoking greater than 20 pack years   [place] [facility] [place] [facility] - A Dept Of [place]. [facility] Joaquin Mulberry, MD   1 year ago Dizziness   [place] [facility] Dept Of [place]. [facility] Joaquin Mulberry, MD       Future Appointments             In 2 weeks Joaquin Mulberry, MD [facility] - A Dept Of [place]. [facility]

## 2023-12-02 ENCOUNTER — Other Ambulatory Visit: Payer: Self-pay | Admitting: *Deleted

## 2023-12-02 ENCOUNTER — Other Ambulatory Visit: Payer: Self-pay | Admitting: Family Medicine

## 2023-12-02 DIAGNOSIS — K580 Irritable bowel syndrome with diarrhea: Secondary | ICD-10-CM

## 2023-12-02 MED ORDER — DIPHENOXYLATE-ATROPINE 2.5-0.025 MG PO TABS: 1.0000 | ORAL_TABLET | Freq: Two times a day (BID) | ORAL | 1 refills | Status: DC | PRN

## 2023-12-08 ENCOUNTER — Ambulatory Visit (INDEPENDENT_AMBULATORY_CARE_PROVIDER_SITE_OTHER)

## 2023-12-08 DIAGNOSIS — G459 Transient cerebral ischemic attack, unspecified: Secondary | ICD-10-CM

## 2023-12-09 LAB — CUP PACEART REMOTE DEVICE CHECK
Date Time Interrogation Session: 20250612014700
Implantable Pulse Generator Implant Date: 20241227
Pulse Gen Serial Number: 124096

## 2023-12-11 ENCOUNTER — Ambulatory Visit: Payer: Self-pay | Admitting: Cardiology

## 2023-12-12 ENCOUNTER — Other Ambulatory Visit: Payer: Self-pay | Admitting: Physician Assistant

## 2023-12-12 DIAGNOSIS — E1165 Type 2 diabetes mellitus with hyperglycemia: Secondary | ICD-10-CM

## 2023-12-15 ENCOUNTER — Encounter: Payer: Self-pay | Admitting: Emergency Medicine

## 2023-12-15 ENCOUNTER — Ambulatory Visit (INDEPENDENT_AMBULATORY_CARE_PROVIDER_SITE_OTHER): Admitting: Emergency Medicine

## 2023-12-15 VITALS — BP 114/68 | HR 94 | Ht 67.0 in | Wt 154.8 lb

## 2023-12-15 DIAGNOSIS — J069 Acute upper respiratory infection, unspecified: Secondary | ICD-10-CM | POA: Diagnosis not present

## 2023-12-15 DIAGNOSIS — J301 Allergic rhinitis due to pollen: Secondary | ICD-10-CM | POA: Diagnosis not present

## 2023-12-15 DIAGNOSIS — J410 Simple chronic bronchitis: Secondary | ICD-10-CM | POA: Diagnosis not present

## 2023-12-15 DIAGNOSIS — Z720 Tobacco use: Secondary | ICD-10-CM

## 2023-12-15 MED ORDER — SPIRIVA RESPIMAT 1.25 MCG/ACT IN AERS: 2.0000 | INHALATION_SPRAY | Freq: Every day | RESPIRATORY_TRACT | 5 refills | Status: DC

## 2023-12-15 MED ORDER — ALBUTEROL SULFATE HFA 108 (90 BASE) MCG/ACT IN AERS
2.0000 | INHALATION_SPRAY | RESPIRATORY_TRACT | 0 refills | Status: DC | PRN
Start: 2023-12-15 — End: 2024-03-09

## 2023-12-15 NOTE — Progress Notes (Signed)
 Subjective:    Patient ID: [name], female    DOB: 1960/12/24, 63 y.o.   MRN: [id number]  HPI   ROV 06/08/2023 --63 year old woman with a history of tobacco use, vape use, COPD with asthmatic features.  I seen her for her COPD as well as a prior right middle lobe nodule that resolved on imaging consistent with a mucoid impaction.  Our plan was to get her back into the lung cancer screening program beginning in August 2025.  She underwent pulmonary function testing today. She was admitted with CVA since I last saw her, now on plavix , farxiga . She is on spiriva , using albuterol  1-2x a week. She is able to exert, although she does get some SOB and fatigue including walking a long distance. She still smokes 3-4 a day.   Pulmonary function testing reviewed by me performed today shows mixed obstruction and restriction with an FEV1 of 2.42 L or 77% predicted (stable compared with 10/04/2013).  No bronchodilator response.  Normal lung volumes.  Decreased diffusion capacity that corrects to the normal range when adjusted for valvular volume.  ROV 12/15/2023 --follow-up visit for 63 year old woman with a history of tobacco and vape use, COPD/asthma, history of right middle lobe mucoid impaction on prior scanning that resolved.  We planned to get her into the lung cancer screening program.  Also with a history of CVA.  She reports that she quit smoking June 3.  We have been managing her on Spiriva .  She reports today that she feels a bit better - able to exert more. She has started vaping some. She has a new dry cough, does have some allergy drainage. On zyrtec . She is on Spiriva , feels that it is helping her. She uses albuterol  about once a day, often at night.   Review of Systems As per HPI  Past Medical History:  Diagnosis Date   Anxiety    Arthritis    Chronic systolic heart failure (HCC)    EF 20-25% ECHO 05/2015   COPD (chronic obstructive pulmonary disease) (HCC)    Depression     Diabetes mellitus without complication (HCC)    Type II   Dysrhythmia    pt unsure of name of arrythmia -  my heart rate will drop all of a sudden -no current treatment - atrial flutter   Fibrillation, atrial (HCC)    Gallstones    GERD (gastroesophageal reflux disease)    Headache(784.0)    migraines   Hypertension    Osteomyelitis (HCC)    R ankle   Rash    arms     Family History  Problem Relation Age of Onset   Diabetes Mother    Lung cancer Father    Heart attack Father 53       CABG x4   Breast cancer Sister        Survivor     Son >> sarcoidosis  Social History   Socioeconomic History   Marital status: Divorced    Spouse name: Not on file   Number of children: Not on file   Years of education: Not on file   Highest education level: Not on file  Occupational History   Not on file  Tobacco Use   Smoking status: Former    Current packs/day: 1.00    Average packs/day: 1 pack/day for 20.9 years (20.9 ttl pk-yrs)    Types: Cigarettes, E-cigarettes    Start date: 09/19/2016   Smokeless tobacco: Never   Tobacco  comments:    Quit smoking 6/3- updated 12/15/2023  Vaping Use   Vaping status: Every Day   Start date: 10/12/2016  Substance and Sexual Activity   Alcohol use: No    Alcohol/week: 0.0 standard drinks of alcohol   Drug use: No   Sexual activity: Not Currently    Birth control/protection: Post-menopausal  Other Topics Concern   Not on file  Social History Narrative   ** Merged History Encounter **    Right handed   Caffeine- 1 cup daily   Social Drivers of Health   Financial Resource Strain: Medium Risk (04/25/2023)   Overall Financial Resource Strain (CARDIA)    Difficulty of Paying Living Expenses: Somewhat hard  Food Insecurity: Food Insecurity Present (04/25/2023)   Hunger Vital Sign    Worried About Running Out of Food in the Last Year: Sometimes true    Ran Out of Food in the Last Year: Sometimes true  Transportation Needs: No  Transportation Needs (04/14/2023)   PRAPARE - Administrator, Civil Service (Medical): No    Lack of Transportation (Non-Medical): No  Physical Activity: Insufficiently Active (04/25/2023)   Exercise Vital Sign    Days of Exercise per Week: 3 days    Minutes of Exercise per Session: 10 min  Stress: No Stress Concern Present (04/25/2023)   [employer] of Occupational Health - Occupational Stress Questionnaire    Feeling of Stress : Not at all  Social Connections: Socially Isolated (04/25/2023)   Social Connection and Isolation Panel    Frequency of Communication with Friends and Family: More than three times a week    Frequency of Social Gatherings with Friends and Family: More than three times a week    Attends Religious Services: Never    Database administrator or Organizations: No    Attends Banker Meetings: Never    Marital Status: Divorced  Catering manager Violence: Not At Risk (04/25/2023)   Humiliation, Afraid, Rape, and Kick questionnaire    Fear of Current or Ex-Partner: No    Emotionally Abused: No    Physically Abused: No    Sexually Abused: No     Allergies  Allergen Reactions   Biaxin [Clarithromycin] Shortness Of Breath and Swelling   Lisinopril Swelling and Cough    Lip edema   Sulfa Antibiotics Anaphylaxis, Shortness Of Breath, Swelling and Hypertension   Chlorthalidone Nausea And Vomiting and Other (See Comments)    Clammy, Tachycardia, Headache    Daptomycin Nausea And Vomiting   Latex Hives and Rash   Amoxicillin -Pot Clavulanate Diarrhea    Has patient had a PCN reaction causing immediate rash, facial/tongue/throat swelling, SOB or lightheadedness with hypotension:No Has patient had a PCN reaction causing severe rash involving mucus membranes or skin necrosis:No Has patient had a PCN reaction that required hospitalization:No Has patient had a PCN reaction occurring within THE LAST 10 YEARS.  #  #  #  YES  #  #  #  If all of  the above answers are NO, then may proceed with Cephalosporin use.    Aspirin  Nausea And Vomiting and Rash    On 325mg  dosage   Cholestyramine Nausea And Vomiting   Dilaudid  [Hydromorphone  Hcl] Nausea And Vomiting   Lasix [Furosemide] Nausea And Vomiting and Other (See Comments)    Headache      Outpatient Medications Prior to Visit  Medication Sig Dispense Refill   ACCU-CHEK GUIDE test strip USE AS DIRECTED IN THE MORNING, AT  NOON, IN THE EVENING AND AT BEDTIME 400 strip 3   Accu-Chek Softclix Lancets lancets Use to check blood sugar THREE TIMES DAILY 100 each 2   amitriptyline  (ELAVIL ) 75 MG tablet Take 1 tablet (75 mg total) by mouth at bedtime. 90 tablet 3   Blood Glucose Monitoring Suppl (ACCU-CHEK GUIDE ME) w/Device KIT Use to check blood sugar TID. 1 kit 0   cetirizine  (ZYRTEC ) 10 MG tablet Take 1 tablet (10 mg total) by mouth daily as needed for allergies. 90 tablet 3   clopidogrel  (PLAVIX ) 75 MG tablet TAKE 1 TABLET(75 MG) BY MOUTH DAILY. 90 tablet 1   dapagliflozin  propanediol (FARXIGA ) 10 MG TABS tablet Take 1 tablet (10 mg total) by mouth daily before breakfast. 90 tablet 3   dicyclomine  (BENTYL ) 10 MG capsule Take 4 times daily 30 minutes before meals and at bedtime. 120 capsule 5   diphenoxylate -atropine  (LOMOTIL ) 2.5-0.025 MG tablet Take 1 tablet by mouth 2 (two) times daily as needed for diarrhea or loose stools. 60 tablet 1   fenofibrate  (TRICOR ) 145 MG tablet TAKE 1 TABLET(145 MG) BY MOUTH DAILY 30 tablet 0   FLUoxetine  (PROZAC ) 40 MG capsule TAKE 1 CAPSULE(40 MG) BY MOUTH AT BEDTIME 90 capsule 1   hydrOXYzine  (ATARAX ) 10 MG tablet TAKE ONE TABLET BY MOUTH THREE TIMES DAILY AS NEEDED 90 tablet 2   insulin  aspart (NOVOLOG  FLEXPEN) 100 UNIT/ML FlexPen Inject 12 Units into the skin 3 (three) times daily with meals. (Patient taking differently: Inject 15 Units into the skin 3 (three) times daily with meals.) 15 mL 3   insulin  glargine (LANTUS  SOLOSTAR) 100 UNIT/ML Solostar  Pen Inject 35 Units into the skin at bedtime. 15 mL 2   Insulin  Pen Needle (BD PEN NEEDLE NANO 2ND GEN) 32G X 4 MM MISC Inject 1 each into the skin as directed. 400 each 2   meclizine  (ANTIVERT ) 25 MG tablet Take 1 tablet (25 mg total) by mouth 3 (three) times daily. 90 tablet 0   metoprolol  succinate (TOPROL -XL) 25 MG 24 hr tablet Take 1 tablet (25 mg total) by mouth daily. 90 tablet 1   omeprazole  (PRILOSEC) 40 MG capsule TAKE 1 CAPSULE(40 MG) BY MOUTH DAILY 90 capsule 0   pregabalin  (LYRICA ) 150 MG capsule Take 1 capsule (150 mg total) by mouth 2 (two) times daily. 60 capsule 5   promethazine  (PHENERGAN ) 25 MG tablet 1/2-1 tab every 4 to 6 hours prn nausea 20 tablet 0   rizatriptan  (MAXALT -MLT) 10 MG disintegrating tablet Take 1 tablet (10 mg total) by mouth as needed for migraine. May repeat in 2 hours if needed 9 tablet 11   rosuvastatin  (CRESTOR ) 20 MG tablet Take 1 tablet (20 mg total) by mouth daily. 90 tablet 1   spironolactone  (ALDACTONE ) 25 MG tablet Take 0.5 tablets (12.5 mg total) by mouth daily. 45 tablet 1   vancomycin  (VANCOCIN ) 125 MG capsule Take 125 mg by mouth 4 (four) times daily.     albuterol  (VENTOLIN  HFA) 108 (90 Base) MCG/ACT inhaler INHALE 2 PUFFS INTO THE LUNGS EVERY 4 HOURS AS NEEDED FOR WHEEZING OR SHORTNESS OF BREATH 54 g 0   SPIRIVA  RESPIMAT 1.25 MCG/ACT AERS INHALE 2 PUFFS INTO THE LUNGS DAILY 4 g 5   doxycycline  (VIBRA -TABS) 100 MG tablet Take 1 tablet (100 mg total) by mouth 2 (two) times daily. (Patient not taking: Reported on 12/15/2023) 20 tablet 0   mupirocin  ointment (BACTROBAN ) 2 % Apply 1 Application topically 2 (two) times daily. (Patient not  taking: Reported on 12/15/2023) 22 g 1   Facility-Administered Medications Prior to Visit  Medication Dose Route Frequency Provider Last Rate Last Admin   regadenoson  (LEXISCAN ) injection SOLN 0.4 mg  0.4 mg Intravenous Once Nelson, Katarina H, MD       technetium tetrofosmin  (TC-MYOVIEW ) injection 32.8 millicurie   32.8 millicurie Intravenous Once PRN Liza Riggers, MD            Objective:   Physical Exam  Vitals:   12/15/23 1053  BP: 114/68  Pulse: 94  SpO2: 95%  Weight: 154 lb 12.8 oz (70.2 kg)  Height: 5' 7 (1.702 m)    Gen: Pleasant, well-nourished, in no distress,  normal affect  ENT: No lesions,  mouth clear,  oropharynx clear, no postnasal drip  Neck: No JVD, no stridor  Lungs: No use of accessory muscles, clear bilaterally  Cardiovascular: RRR, heart sounds normal, no murmur or gallops, no peripheral edema  Musculoskeletal: No deformities, no cyanosis or clubbing  Neuro: alert, awake, non focal  Skin: Warm, scattered rash on back, face, arms with some evidence of excoriation      Assessment & Plan:  Tobacco abuse CONGRATULATIONS ON STOPPING SMOKING! Continue to try to work on decreasing your vaping.  Our ultimate goal will be for you to be able to stop altogether. We will get your lung cancer screening CT scan scheduled for September. Follow Dr. Baldwin Levee in September after your CT chest so we can review those results together.  COPD (chronic obstructive pulmonary disease) (HCC) Continue your Spiriva  2 puffs once daily. Keep your albuterol  available to use 2 puffs when needed for shortness of breath, chest tightness, wheezing  Allergic rhinitis Continue Zyrtec  once daily      Racheal Buddle, MD, PhD 12/15/2023, 11:18 AM [facility] [phone] or if no answer before 7:00PM call [phone] For any issues after 7:00PM please call eLink [phone]

## 2023-12-15 NOTE — Assessment & Plan Note (Signed)
Continue Zyrtec once daily 

## 2023-12-15 NOTE — Patient Instructions (Addendum)
 CONGRATULATIONS ON STOPPING SMOKING! Continue to try to work on decreasing your vaping.  Our ultimate goal will be for you to be able to stop altogether. Continue your Spiriva  2 puffs once daily. Keep your albuterol  available to use 2 puffs when needed for shortness of breath, chest tightness, wheezing. We will get your lung cancer screening CT scan scheduled for September. Continue Zyrtec  once daily Follow Dr. Baldwin Levee in September after your CT chest so we can review those results together.

## 2023-12-15 NOTE — Assessment & Plan Note (Signed)
 CONGRATULATIONS ON STOPPING SMOKING! Continue to try to work on decreasing your vaping.  Our ultimate goal will be for you to be able to stop altogether. We will get your lung cancer screening CT scan scheduled for September. Follow Dr. Baldwin Levee in September after your CT chest so we can review those results together.

## 2023-12-15 NOTE — Assessment & Plan Note (Signed)
 Continue your Spiriva  2 puffs once daily. Keep your albuterol  available to use 2 puffs when needed for shortness of breath, chest tightness, wheezing

## 2023-12-16 ENCOUNTER — Telehealth: Payer: Self-pay | Admitting: Family Medicine

## 2023-12-16 NOTE — Telephone Encounter (Signed)
 Pt confirmed appt!(Text) Contacted pt again to confirmed appt

## 2023-12-19 ENCOUNTER — Ambulatory Visit: Payer: Self-pay | Admitting: Family Medicine

## 2023-12-27 NOTE — Addendum Note (Signed)
 Addended by: TAWNI DRILLING D on: 12/27/2023 10:26 AM   Modules accepted: Orders

## 2023-12-27 NOTE — Progress Notes (Signed)
 [facility] Summary Report / Loop Recorder

## 2024-01-06 ENCOUNTER — Other Ambulatory Visit: Payer: Self-pay | Admitting: Family Medicine

## 2024-01-09 ENCOUNTER — Ambulatory Visit (INDEPENDENT_AMBULATORY_CARE_PROVIDER_SITE_OTHER)

## 2024-01-09 DIAGNOSIS — G459 Transient cerebral ischemic attack, unspecified: Secondary | ICD-10-CM | POA: Diagnosis not present

## 2024-01-10 LAB — CUP PACEART REMOTE DEVICE CHECK
Date Time Interrogation Session: 20250714015000
Implantable Pulse Generator Implant Date: 20241227
Pulse Gen Serial Number: 124096

## 2024-01-14 ENCOUNTER — Other Ambulatory Visit: Payer: Self-pay | Admitting: Family Medicine

## 2024-01-14 DIAGNOSIS — H8113 Benign paroxysmal vertigo, bilateral: Secondary | ICD-10-CM

## 2024-01-15 ENCOUNTER — Ambulatory Visit: Payer: Self-pay | Admitting: Cardiology

## 2024-01-16 ENCOUNTER — Other Ambulatory Visit: Payer: Self-pay | Admitting: Family Medicine

## 2024-01-16 DIAGNOSIS — E1165 Type 2 diabetes mellitus with hyperglycemia: Secondary | ICD-10-CM

## 2024-01-25 ENCOUNTER — Telehealth: Payer: Self-pay | Admitting: Family Medicine

## 2024-01-25 NOTE — Telephone Encounter (Signed)
 Called patient to confirm upcoming appointment 731/2025 at 11:10 am. Patient appointment has been successfully confirmed

## 2024-01-26 ENCOUNTER — Ambulatory Visit: Attending: Family Medicine | Admitting: Family Medicine

## 2024-01-26 ENCOUNTER — Encounter: Payer: Self-pay | Admitting: Radiology

## 2024-01-26 ENCOUNTER — Encounter: Payer: Self-pay | Admitting: Family Medicine

## 2024-01-26 ENCOUNTER — Ambulatory Visit (INDEPENDENT_AMBULATORY_CARE_PROVIDER_SITE_OTHER): Admitting: Radiology

## 2024-01-26 VITALS — BP 124/72 | HR 96 | Ht 68.0 in | Wt 152.0 lb

## 2024-01-26 VITALS — BP 119/76 | HR 84 | Ht 67.0 in | Wt 153.4 lb

## 2024-01-26 DIAGNOSIS — L0291 Cutaneous abscess, unspecified: Secondary | ICD-10-CM | POA: Diagnosis not present

## 2024-01-26 DIAGNOSIS — Z794 Long term (current) use of insulin: Secondary | ICD-10-CM

## 2024-01-26 DIAGNOSIS — Z8673 Personal history of transient ischemic attack (TIA), and cerebral infarction without residual deficits: Secondary | ICD-10-CM | POA: Diagnosis not present

## 2024-01-26 DIAGNOSIS — N8110 Cystocele, unspecified: Secondary | ICD-10-CM | POA: Diagnosis not present

## 2024-01-26 DIAGNOSIS — I5022 Chronic systolic (congestive) heart failure: Secondary | ICD-10-CM | POA: Diagnosis not present

## 2024-01-26 DIAGNOSIS — L0292 Furuncle, unspecified: Secondary | ICD-10-CM

## 2024-01-26 DIAGNOSIS — F419 Anxiety disorder, unspecified: Secondary | ICD-10-CM

## 2024-01-26 DIAGNOSIS — E1165 Type 2 diabetes mellitus with hyperglycemia: Secondary | ICD-10-CM | POA: Diagnosis not present

## 2024-01-26 DIAGNOSIS — N762 Acute vulvitis: Secondary | ICD-10-CM | POA: Diagnosis not present

## 2024-01-26 DIAGNOSIS — G4700 Insomnia, unspecified: Secondary | ICD-10-CM

## 2024-01-26 DIAGNOSIS — K580 Irritable bowel syndrome with diarrhea: Secondary | ICD-10-CM

## 2024-01-26 DIAGNOSIS — J449 Chronic obstructive pulmonary disease, unspecified: Secondary | ICD-10-CM

## 2024-01-26 DIAGNOSIS — N898 Other specified noninflammatory disorders of vagina: Secondary | ICD-10-CM

## 2024-01-26 DIAGNOSIS — E785 Hyperlipidemia, unspecified: Secondary | ICD-10-CM

## 2024-01-26 LAB — WET PREP FOR TRICH, YEAST, CLUE

## 2024-01-26 LAB — POCT GLYCOSYLATED HEMOGLOBIN (HGB A1C): HbA1c, POC (controlled diabetic range): 10.6 % — AB (ref 0.0–7.0)

## 2024-01-26 MED ORDER — FLUOXETINE HCL 40 MG PO CAPS: 40.0000 mg | ORAL_CAPSULE | Freq: Every day | ORAL | 1 refills | Status: DC

## 2024-01-26 MED ORDER — PREMARIN 0.625 MG/GM VA CREA: 1.0000 | TOPICAL_CREAM | VAGINAL | 1 refills | Status: AC

## 2024-01-26 MED ORDER — CLOBETASOL PROPIONATE 0.05 % EX OINT: 1.0000 | TOPICAL_OINTMENT | Freq: Two times a day (BID) | CUTANEOUS | 0 refills | Status: DC

## 2024-01-26 MED ORDER — OMEPRAZOLE 40 MG PO CPDR: 40.0000 mg | DELAYED_RELEASE_CAPSULE | Freq: Every day | ORAL | 1 refills | Status: AC

## 2024-01-26 MED ORDER — SPIRONOLACTONE 25 MG PO TABS: 12.5000 mg | ORAL_TABLET | Freq: Every day | ORAL | 1 refills | Status: AC

## 2024-01-26 MED ORDER — DOXYCYCLINE HYCLATE 100 MG PO TABS
100.0000 mg | ORAL_TABLET | Freq: Two times a day (BID) | ORAL | 0 refills | Status: DC
Start: 2024-01-26 — End: 2024-03-12

## 2024-01-26 MED ORDER — TRAZODONE HCL 50 MG PO TABS: 50.0000 mg | ORAL_TABLET | Freq: Every evening | ORAL | 1 refills | Status: DC | PRN

## 2024-01-26 MED ORDER — ROSUVASTATIN CALCIUM 20 MG PO TABS: 20.0000 mg | ORAL_TABLET | Freq: Every day | ORAL | 1 refills | Status: AC

## 2024-01-26 MED ORDER — LANTUS SOLOSTAR 100 UNIT/ML ~~LOC~~ SOPN: 40.0000 [IU] | PEN_INJECTOR | Freq: Every day | SUBCUTANEOUS | 1 refills | Status: DC

## 2024-01-26 MED ORDER — NOVOLOG FLEXPEN 100 UNIT/ML ~~LOC~~ SOPN: 15.0000 [IU] | PEN_INJECTOR | Freq: Three times a day (TID) | SUBCUTANEOUS | 1 refills | Status: DC

## 2024-01-26 MED ORDER — METOPROLOL SUCCINATE ER 25 MG PO TB24: 25.0000 mg | ORAL_TABLET | Freq: Every day | ORAL | 1 refills | Status: AC

## 2024-01-26 MED ORDER — CLOPIDOGREL BISULFATE 75 MG PO TABS: 75.0000 mg | ORAL_TABLET | Freq: Every day | ORAL | 1 refills | Status: DC

## 2024-01-26 NOTE — Patient Instructions (Signed)
 [facility]  [address], [place], [place] [address] Ph# [phone] Fax [phone]

## 2024-01-26 NOTE — Progress Notes (Signed)
 Subjective:  Patient ID: [name], female    DOB: January 27, 1961  Age: 63 y.o. MRN: [id number]  CC: Medical Management of Chronic Issues (Not sleeping)     Discussed the use of AI scribe software for clinical note transcription with the patient, who gave verbal consent to proceed.  History of Present Illness [name] is a 63 year old female with a history of type 2 diabetes mellitus, hypertension, CHF (EF 60-65%  2-D echo of 08/2021), anxiety, GERD, COVID-19 in 01/2020, greater than 20-pack-year history, right cerebellar stroke in 03/2023 (currently on Plavix  due to ASA intolerance) who presents with facial and groin skin lesions and sleep disturbances.  She experiences painful, swollen bumps on her face and groin, sometimes exuding a white substance. These lesions cause significant discomfort and itching. She usually requires treatment with doxycycline .  I referred her to dermatology in the fall of last year.  Notes in her chart indicate they were unsuccessful in reaching her.  She has difficulty sleeping, often not falling asleep until 2 AM, despite an increased dose of amitriptyline  to 75 mg two months ago. Hydroxyzine  for anxiety is also ineffective in improving her sleep.  She takes Plavix  due to aspirin  intolerance. She experiences diabetic neuropathy, managed with Lyrica , and requires Lomotil  to control chronic diarrhea in addition to Bentyl .  She has completed a course of vancomycin  prescribed by GI following a C. difficile infection.  Migraines are managed by neurology with amitriptyline  and Maxalt . Her diabetes is managed with Lantus  insulin  and Farxiga  and her last A1c was 10.6.  She was discharged from [facility] endocrinology due to multiple no-shows for which I had to refer her to a different endocrinologist with an upcoming appointment next month.  She is on multiple medications, including Prozac  for anxiety, fenofibrate  and Crestor  for cholesterol, and Spiriva  for  COPD. She uses Zyrtec  as needed for allergic rhinitis.    Past Medical History:  Diagnosis Date   Anxiety    Arthritis    Chronic systolic heart failure (HCC)    EF 20-25% ECHO 05/2015   COPD (chronic obstructive pulmonary disease) (HCC)    Depression    Diabetes mellitus without complication (HCC)    Type II   Dysrhythmia    pt unsure of name of arrythmia -  my heart rate will drop all of a sudden -no current treatment - atrial flutter   Fibrillation, atrial (HCC)    Gallstones    GERD (gastroesophageal reflux disease)    Headache(784.0)    migraines   Hypertension    Osteomyelitis (HCC)    R ankle   Rash    arms    Past Surgical History:  Procedure Laterality Date   ANKLE FRACTURE SURGERY Right 2015   CARDIAC CATHETERIZATION N/A 06/24/2015   Procedure: Left Heart Cath and Coronary Angiography;  Surgeon: Victory LELON Sharps, MD;  Location: [facility] INVASIVE CV LAB;  Service: Cardiovascular;  Laterality: N/A;   CARPAL TUNNEL RELEASE     bil   CHOLECYSTECTOMY  11/24/2011   Procedure: LAPAROSCOPIC CHOLECYSTECTOMY WITH INTRAOPERATIVE CHOLANGIOGRAM;  Surgeon: Debby LABOR. Cornett, MD;  Location: [facility] ORS;  Service: General;  Laterality: N/A;  Laparoscopic Cholecystectomy with Cholangiogram   COLONOSCOPY     ECTOPIC PREGNANCY SURGERY  many yrs ago   HARDWARE REMOVAL Right 07/15/2015   Procedure: RIGHT ANKLE HARDWARE REMOVAL, PLACEMENT OF STIMULAN ANTIBIOTIC BEADS AND PREVENA WOUND VAC.;  Surgeon: Glendia Cordella Hutchinson, MD;  Location: [facility] OR;  Service: Orthopedics;  Laterality:  Right;   TOOTH EXTRACTION N/A 12/03/2016   Procedure: DENTAL EXTRACTIONS with Alveoloplasty;  Surgeon: Sheryle Hamilton, DDS;  Location: [facility] OR;  Service: Oral Surgery;  Laterality: N/A;    Family History  Problem Relation Age of Onset   Diabetes Mother    Lung cancer Father    Heart attack Father 30       CABG x4   Breast cancer Sister        Survivor     Social History   Socioeconomic History   Marital status:  Divorced    Spouse name: Not on file   Number of children: Not on file   Years of education: Not on file   Highest education level: Not on file  Occupational History   Not on file  Tobacco Use   Smoking status: Former    Current packs/day: 1.00    Average packs/day: 1 pack/day for 21.1 years (21.1 ttl pk-yrs)    Types: Cigarettes, E-cigarettes    Start date: 09/19/2016    Passive exposure: Past   Smokeless tobacco: Never   Tobacco comments:    Quit smoking 6/3- updated 12/15/2023, VAPES--no nicotine   Vaping Use   Vaping status: Every Day   Start date: 10/12/2016  Substance and Sexual Activity   Alcohol use: No    Alcohol/week: 0.0 standard drinks of alcohol   Drug use: No   Sexual activity: Not Currently    Partners: Male    Birth control/protection: Post-menopausal    Comment: menarche 63yo, sexual debut 63yo  Other Topics Concern   Not on file  Social History Narrative   ** Merged History Encounter **    Right handed   Caffeine- 1 cup daily   Social Drivers of Health   Financial Resource Strain: Medium Risk (04/25/2023)   Overall Financial Resource Strain (CARDIA)    Difficulty of Paying Living Expenses: Somewhat hard  Food Insecurity: Food Insecurity Present (04/25/2023)   Hunger Vital Sign    Worried About Running Out of Food in the Last Year: Sometimes true    Ran Out of Food in the Last Year: Sometimes true  Transportation Needs: No Transportation Needs (04/14/2023)   PRAPARE - Administrator, Civil Service (Medical): No    Lack of Transportation (Non-Medical): No  Physical Activity: Insufficiently Active (04/25/2023)   Exercise Vital Sign    Days of Exercise per Week: 3 days    Minutes of Exercise per Session: 10 min  Stress: No Stress Concern Present (04/25/2023)   [employer] of Occupational Health - Occupational Stress Questionnaire    Feeling of Stress : Not at all  Social Connections: Socially Isolated (04/25/2023)   Social Connection  and Isolation Panel    Frequency of Communication with Friends and Family: More than three times a week    Frequency of Social Gatherings with Friends and Family: More than three times a week    Attends Religious Services: Never    Database administrator or Organizations: No    Attends Engineer, structural: Never    Marital Status: Divorced    Allergies  Allergen Reactions   Biaxin [Clarithromycin] Shortness Of Breath and Swelling   Lisinopril Swelling and Cough    Lip edema   Sulfa Antibiotics Anaphylaxis, Shortness Of Breath, Swelling and Hypertension   Chlorthalidone Nausea And Vomiting and Other (See Comments)    Clammy, Tachycardia, Headache    Daptomycin Nausea And Vomiting   Latex Hives and Rash  Amoxicillin -Pot Clavulanate Diarrhea    Has patient had a PCN reaction causing immediate rash, facial/tongue/throat swelling, SOB or lightheadedness with hypotension:No Has patient had a PCN reaction causing severe rash involving mucus membranes or skin necrosis:No Has patient had a PCN reaction that required hospitalization:No Has patient had a PCN reaction occurring within THE LAST 10 YEARS.  #  #  #  YES  #  #  #  If all of the above answers are NO, then may proceed with Cephalosporin use.    Aspirin  Nausea And Vomiting and Rash    On 325mg  dosage   Cholestyramine Nausea And Vomiting   Dilaudid  [Hydromorphone  Hcl] Nausea And Vomiting   Lasix [Furosemide] Nausea And Vomiting and Other (See Comments)    Headache     Outpatient Medications Prior to Visit  Medication Sig Dispense Refill   ACCU-CHEK GUIDE test strip USE AS DIRECTED IN THE MORNING, AT NOON, IN THE EVENING AND AT BEDTIME 400 strip 3   Accu-Chek Softclix Lancets lancets Use to check blood sugar THREE TIMES DAILY 100 each 2   albuterol  (VENTOLIN  HFA) 108 (90 Base) MCG/ACT inhaler Inhale 2 puffs into the lungs every 4 (four) hours as needed for wheezing or shortness of breath. 54 g 0   amitriptyline   (ELAVIL ) 75 MG tablet Take 1 tablet (75 mg total) by mouth at bedtime. 90 tablet 3   Blood Glucose Monitoring Suppl (ACCU-CHEK GUIDE ME) w/Device KIT Use to check blood sugar TID. 1 kit 0   cetirizine  (ZYRTEC ) 10 MG tablet Take 1 tablet (10 mg total) by mouth daily as needed for allergies. (Patient not taking: Reported on 01/26/2024) 90 tablet 3   dapagliflozin  propanediol (FARXIGA ) 10 MG TABS tablet Take 1 tablet (10 mg total) by mouth daily before breakfast. 90 tablet 3   dicyclomine  (BENTYL ) 10 MG capsule Take 4 times daily 30 minutes before meals and at bedtime. 120 capsule 5   diphenoxylate -atropine  (LOMOTIL ) 2.5-0.025 MG tablet Take 1 tablet by mouth 2 (two) times daily as needed for diarrhea or loose stools. 60 tablet 1   Insulin  Pen Needle (BD PEN NEEDLE NANO 2ND GEN) 32G X 4 MM MISC Inject 1 each into the skin as directed. 400 each 2   meclizine  (ANTIVERT ) 25 MG tablet TAKE 1 TABLET(25 MG) BY MOUTH THREE TIMES DAILY 30 tablet 0   pregabalin  (LYRICA ) 150 MG capsule Take 1 capsule (150 mg total) by mouth 2 (two) times daily. 60 capsule 5   Tiotropium Bromide  Monohydrate (SPIRIVA  RESPIMAT) 1.25 MCG/ACT AERS Inhale 2 puffs into the lungs daily. 4 g 5   clopidogrel  (PLAVIX ) 75 MG tablet TAKE 1 TABLET(75 MG) BY MOUTH DAILY. 90 tablet 1   doxycycline  (VIBRA -TABS) 100 MG tablet Take 1 tablet (100 mg total) by mouth 2 (two) times daily. 20 tablet 0   fenofibrate  (TRICOR ) 145 MG tablet TAKE 1 TABLET(145 MG) BY MOUTH DAILY 30 tablet 0   FLUoxetine  (PROZAC ) 40 MG capsule TAKE 1 CAPSULE(40 MG) BY MOUTH AT BEDTIME 90 capsule 1   hydrOXYzine  (ATARAX ) 10 MG tablet TAKE ONE TABLET BY MOUTH THREE TIMES DAILY AS NEEDED 90 tablet 2   insulin  aspart (NOVOLOG  FLEXPEN) 100 UNIT/ML FlexPen Inject 12 Units into the skin 3 (three) times daily with meals. (Patient taking differently: Inject 15 Units into the skin 3 (three) times daily with meals.) 15 mL 3   insulin  glargine (LANTUS  SOLOSTAR) 100 UNIT/ML Solostar Pen  Inject 35 Units into the skin at bedtime. 15 mL 2  metoprolol  succinate (TOPROL -XL) 25 MG 24 hr tablet Take 1 tablet (25 mg total) by mouth daily. 90 tablet 1   omeprazole  (PRILOSEC) 40 MG capsule TAKE 1 CAPSULE(40 MG) BY MOUTH DAILY 90 capsule 0   rosuvastatin  (CRESTOR ) 20 MG tablet Take 1 tablet (20 mg total) by mouth daily. 90 tablet 1   spironolactone  (ALDACTONE ) 25 MG tablet Take 0.5 tablets (12.5 mg total) by mouth daily. 45 tablet 1   mupirocin  ointment (BACTROBAN ) 2 % Apply 1 Application topically 2 (two) times daily. (Patient not taking: Reported on 01/26/2024) 22 g 1   promethazine  (PHENERGAN ) 25 MG tablet 1/2-1 tab every 4 to 6 hours prn nausea (Patient not taking: Reported on 01/26/2024) 20 tablet 0   rizatriptan  (MAXALT -MLT) 10 MG disintegrating tablet Take 1 tablet (10 mg total) by mouth as needed for migraine. May repeat in 2 hours if needed (Patient not taking: Reported on 01/26/2024) 9 tablet 11   vancomycin  (VANCOCIN ) 125 MG capsule Take 125 mg by mouth 4 (four) times daily. (Patient not taking: Reported on 01/26/2024)     Facility-Administered Medications Prior to Visit  Medication Dose Route Frequency Provider Last Rate Last Admin   regadenoson  (LEXISCAN ) injection SOLN 0.4 mg  0.4 mg Intravenous Once Nelson, Katarina H, MD       technetium tetrofosmin  (TC-MYOVIEW ) injection 32.8 millicurie  32.8 millicurie Intravenous Once PRN Maranda Leim DEL, MD         ROS Review of Systems  Constitutional:  Negative for activity change and appetite change.  HENT:  Negative for sinus pressure and sore throat.   Respiratory:  Negative for chest tightness, shortness of breath and wheezing.   Cardiovascular:  Negative for chest pain and palpitations.  Gastrointestinal:  Negative for abdominal distention, abdominal pain and constipation.  Genitourinary: Negative.   Musculoskeletal: Negative.   Psychiatric/Behavioral:  Positive for sleep disturbance. Negative for behavioral problems and  dysphoric mood.     Objective:  BP 119/76   Pulse 84   Ht 5' 7 (1.702 m)   Wt 153 lb 6.4 oz (69.6 kg)   LMP 06/09/2011 Comment: verified BEFORE imaging  SpO2 97%   BMI 24.03 kg/m      01/26/2024    2:08 PM 01/26/2024   11:16 AM 12/15/2023   10:53 AM  BP/Weight  Systolic BP 124 119 114  Diastolic BP 72 76 68  Wt. (Lbs) 152 153.4 154.8  BMI 23.11 kg/m2 24.03 kg/m2 24.25 kg/m2      Physical Exam Constitutional:      Appearance: She is well-developed.  Cardiovascular:     Rate and Rhythm: Normal rate.     Heart sounds: Normal heart sounds. No murmur heard. Pulmonary:     Effort: Pulmonary effort is normal.     Breath sounds: Normal breath sounds. No wheezing or rales.  Chest:     Chest wall: No tenderness.  Abdominal:     General: Bowel sounds are normal. There is no distension.     Palpations: Abdomen is soft. There is no mass.     Tenderness: There is no abdominal tenderness.  Musculoskeletal:        General: Normal range of motion.     Right lower leg: No edema.     Left lower leg: No edema.  Skin:    Comments: Erythematous lesions on face , upper extremities.  Neurological:     Mental Status: She is alert and oriented to person, place, and time.  Psychiatric:  Mood and Affect: Mood normal.        Latest Ref Rng & Units 09/15/2023    3:54 PM 06/03/2023    4:35 PM 04/25/2023   11:22 AM  CMP  Glucose 70 - 99 mg/dL 786  812  889   BUN 8 - 27 mg/dL 9  9  8    Creatinine 0.57 - 1.00 mg/dL 9.28  9.27  9.33   Sodium 134 - 144 mmol/L 142  142  141   Potassium 3.5 - 5.2 mmol/L 3.8  4.2  4.3   Chloride 96 - 106 mmol/L 107  105  105   CO2 20 - 29 mmol/L 21  22  20    Calcium  8.7 - 10.3 mg/dL 9.2  9.1  9.3   Total Protein 6.0 - 8.5 g/dL 6.5     Total Bilirubin 0.0 - 1.2 mg/dL <9.7     Alkaline Phos 44 - 121 IU/L 80     AST 0 - 40 IU/L 18     ALT 0 - 32 IU/L 12       Lipid Panel     Component Value Date/Time   CHOL 96 04/14/2023 0447   CHOL 135  03/21/2023 0848   TRIG 90 04/14/2023 0447   HDL 35 (L) 04/14/2023 0447   HDL 34 (L) 03/21/2023 0848   CHOLHDL 2.7 04/14/2023 0447   VLDL 18 04/14/2023 0447   LDLCALC 43 04/14/2023 0447   LDLCALC 79 03/21/2023 0848    CBC    Component Value Date/Time   WBC 7.5 09/15/2023 1554   WBC 8.4 04/13/2023 0818   RBC 4.73 09/15/2023 1554   RBC 5.15 (H) 04/13/2023 0818   HGB 13.1 09/15/2023 1554   HCT 41.3 09/15/2023 1554   PLT 154 09/15/2023 1554   MCV 87 09/15/2023 1554   MCH 27.7 09/15/2023 1554   MCH 28.0 04/13/2023 0818   MCHC 31.7 09/15/2023 1554   MCHC 33.0 04/13/2023 0818   RDW 13.1 09/15/2023 1554   LYMPHSABS 1.9 09/15/2023 1554   MONOABS 0.5 04/13/2023 0006   EOSABS 0.3 09/15/2023 1554   BASOSABS 0.0 09/15/2023 1554    Lab Results  Component Value Date   HGBA1C 10.6 (A) 01/26/2024    Lab Results  Component Value Date   HGBA1C 10.6 (A) 01/26/2024   HGBA1C 10.1 (A) 09/15/2023   HGBA1C 11.4 (H) 04/13/2023       Assessment & Plan Recurrent furnculosis (face, arm, groin) Recurrent painful and swollen skin lesions persist despite doxycycline  treatment, which may cause diarrhea. - Place a new referral to [facility]. - Advise her to call and schedule an appointment with the dermatologist.  Insomnia  Amitriptyline  and hydroxyzine  ineffective for sleep and anxiety. - Discontinue hydroxyzine . - Prescribe trazodone  for sleep and depression. - Monitor response to trazodone  and consider referral to a sleep specialist if insomnia persists.  Type 2 diabetes mellitus with diabetic neuropathy, poorly controlled Poorly controlled diabetes with A1c of 10.6. Neuropathy symptoms include numbness in toes and side of foot. - Increase Lantus  to 40 units in the morning. - Continue current meal-time insulin  regimen. - Keep appointment with endocrinologist next month.  Chronic diarrhea/ irritable bowel syndrome,  Status post C. difficile infection Chronic diarrhea  persists despite completion of antibiotics for C. difficile. Doxycycline  may exacerbate diarrhea. - Make an appointment with gastroenterology to report persistent diarrhea post-antibiotic treatment. - Continue current medications for diarrhea management.  Depression and generalized anxiety disorder Long-standing depression and anxiety. Prozac   and hydroxyzine  ineffective. Trazodone  added for depression and sleep. - Discontinue hydroxyzine . - Add trazodone  for depression and sleep. - Monitor response to trazodone .  Migraine Migraine management includes amitriptyline  and rizatriptan . Amitriptyline  ineffective for sleep. - Continue current migraine medications.  History of ischemic stroke (right cerebellum) on antiplatelet therapy Ischemic stroke in the right cerebellum.  Status post loop recorder Plavix  continued due to aspirin  intolerance. - Continue Plavix  indefinitely due to aspirin  intolerance.  Hypertensive Heart Disease with CHF Heart function at 45-50% indicating CHF. Current medications include spironolactone  and Farxiga . - Continue spironolactone  and Farxiga .  Hypertension associated with Type 2 Diabetes Blood pressure well-controlled on current regimen. - Continue metoprolol  and spironolactone .  Hyperlipidemia with type 2 diabetes mellitus Hyperlipidemia management includes Crestor  and fenofibrate . Recent cholesterol levels were normal. - Discontinue fenofibrate . - Continue Crestor .  Chronic obstructive pulmonary disease (COPD) COPD management includes Spiriva  and albuterol  as needed. - Continue Spiriva  and albuterol  as needed.    Meds ordered this encounter  Medications   traZODone  (DESYREL ) 50 MG tablet    Sig: Take 1 tablet (50 mg total) by mouth at bedtime as needed for sleep.    Dispense:  90 tablet    Refill:  1   insulin  glargine (LANTUS  SOLOSTAR) 100 UNIT/ML Solostar Pen    Sig: Inject 40 Units into the skin at bedtime.    Dispense:  15 mL    Refill:  1    insulin  aspart (NOVOLOG  FLEXPEN) 100 UNIT/ML FlexPen    Sig: Inject 15 Units into the skin 3 (three) times daily with meals.    Dispense:  30 mL    Refill:  1   doxycycline  (VIBRA -TABS) 100 MG tablet    Sig: Take 1 tablet (100 mg total) by mouth 2 (two) times daily.    Dispense:  20 tablet    Refill:  0   clopidogrel  (PLAVIX ) 75 MG tablet    Sig: Take 1 tablet (75 mg total) by mouth daily.    Dispense:  90 tablet    Refill:  1   FLUoxetine  (PROZAC ) 40 MG capsule    Sig: Take 1 capsule (40 mg total) by mouth daily.    Dispense:  90 capsule    Refill:  1   metoprolol  succinate (TOPROL -XL) 25 MG 24 hr tablet    Sig: Take 1 tablet (25 mg total) by mouth daily.    Dispense:  90 tablet    Refill:  1   omeprazole  (PRILOSEC) 40 MG capsule    Sig: Take 1 capsule (40 mg total) by mouth daily.    Dispense:  90 capsule    Refill:  1    **Patient requests 90 days supply**   rosuvastatin  (CRESTOR ) 20 MG tablet    Sig: Take 1 tablet (20 mg total) by mouth daily.    Dispense:  90 tablet    Refill:  1   spironolactone  (ALDACTONE ) 25 MG tablet    Sig: Take 0.5 tablets (12.5 mg total) by mouth daily.    Dispense:  45 tablet    Refill:  1    Follow-up: Return in about 6 months (around 07/28/2024) for Chronic medical conditions.       Corrina Sabin, MD, FAAFP. [facility] [place], [place] [phone]   01/26/2024, 2:50 PM

## 2024-01-26 NOTE — Progress Notes (Signed)
 [name] 07/31/1960 [id number]   History:  63 y.o. G9P4 Referred from [facility] for vulvar lesion. Complains of lesion/sore area she feels with wiping noticed x's years. Also complains of hair follicle bumps in groin area x's years (improve with steroids and doxycycline --PCP rx'd doxycycline --patient hasn't picked up medicine). Also complains of vaginal discharge, itching, severe redness or vulva since starting Farxiga  3 months ago. Uterine prolapse x 10 years, has never seen a provider for it. Has not been sexually active x 8 years because she is embarrassed about the prolapse.   Gynecologic History Patient's last menstrual period was 06/09/2011.   Contraception/Family planning: post menopausal status Sexually active: no Last Pap: 2022. Results were: abnormal Last mammogram: 5/24  Obstetric History OB History  Gravida Para Term Preterm AB Living  9 0 0 0 5 3  SAB IAB Ectopic Multiple Live Births  4 0 1  3    # Outcome Date GA Lbr Len/2nd Weight Sex Type Anes PTL Lv  9 SAB           8 SAB           7 SAB           6 SAB           5 Ectopic           4 Gravida      Vag-Spont   LIV  3 Gravida      Vag-Spont   LIV  2 Gravida      Vag-Spont   LIV  1 Gravida      Vag-Spont   LIV    Obstetric Comments  1 child deceased age 34 from febrile seizure      The following portions of the patient's history were reviewed and updated as appropriate: allergies, current medications, past family history, past medical history, past social history, past surgical history, and problem list.  Review of Systems  All other systems reviewed and are negative.   Past medical history, past surgical history, family history and social history were all reviewed and documented in the EPIC chart.  Exam:  Vitals:   01/26/24 1408  BP: 124/72  Pulse: 96  SpO2: 94%  Weight: 152 lb (68.9 kg)  Height: 5' 8 (1.727 m)   Body mass index is 23.11 kg/m.  Physical  Exam Vitals and nursing note reviewed. Exam conducted with a chaperone present.  Constitutional:      Appearance: Normal appearance. She is well-developed.  Pulmonary:     Effort: Pulmonary effort is normal.  Abdominal:     General: Abdomen is flat.     Palpations: Abdomen is soft.  Genitourinary:    General: Normal vulva.     Vagina: Vaginal discharge present. No erythema, bleeding or lesions.     Cervix: Normal. No discharge, friability, lesion or erythema.     Uterus: Normal. With uterine prolapse.      Adnexa: Right adnexa normal and left adnexa normal.      Comments: Grade 3 prolapse with valsalva Neurological:     Mental Status: She is alert.  Psychiatric:        Mood and Affect: Mood normal.        Thought Content: Thought content normal.        Judgment: Judgment normal.      Darice Hoit, CMA present for exam  Microscopic wet-mount exam shows negative for pathogens, normal epithelial cells.   Assessment/Plan:   1.  Acute vulvitis (Primary) - clobetasol  ointment (TEMOVATE ) 0.05 %; Apply 1 Application topically 2 (two) times daily. For 14 days  Dispense: 30 g; Refill: 0 - If no improvement after steroid x 2 weeks will biopsy  2. Baden-Walker grade 3 cystocele - Ambulatory referral to Urogynecology Rx sent for premarin  cream to use twice weekly at bedtime and a thin layer to be applied to fissure daily  3. Vaginal discharge Reassured negative wet prep - WET PREP FOR TRICH, YEAST, CLUE  4. Cutaneous abscess, unspecified site Pick up antibiotics prescribed by PCP Derm referral pending.    Return in about 2 weeks (around 02/09/2024) for f/u vulvitis.  [name] 2:37 PM 01/26/2024

## 2024-01-27 ENCOUNTER — Ambulatory Visit: Payer: Self-pay | Admitting: Family Medicine

## 2024-01-27 LAB — LP+NON-HDL CHOLESTEROL
Cholesterol, Total: 107 mg/dL (ref 100–199)
HDL: 34 mg/dL — ABNORMAL LOW (ref >39)
LDL Chol Calc (NIH): 50 mg/dL (ref 0–99)
Total Non-HDL-Chol (LDL+VLDL): 73 mg/dL (ref 0–129)
Triglycerides: 129 mg/dL (ref 0–149)
VLDL Cholesterol Cal: 23 mg/dL (ref 5–40)

## 2024-01-30 ENCOUNTER — Other Ambulatory Visit: Payer: Self-pay | Admitting: Family Medicine

## 2024-01-30 DIAGNOSIS — H8113 Benign paroxysmal vertigo, bilateral: Secondary | ICD-10-CM

## 2024-01-31 NOTE — Progress Notes (Signed)
 [facility] Summary Report / Loop Recorder

## 2024-01-31 NOTE — Addendum Note (Signed)
 Addended by: VICCI SELLER A on: 01/31/2024 02:13 PM   Modules accepted: Orders

## 2024-02-09 ENCOUNTER — Ambulatory Visit: Admitting: Radiology

## 2024-02-09 ENCOUNTER — Ambulatory Visit (INDEPENDENT_AMBULATORY_CARE_PROVIDER_SITE_OTHER)

## 2024-02-09 DIAGNOSIS — G459 Transient cerebral ischemic attack, unspecified: Secondary | ICD-10-CM | POA: Diagnosis not present

## 2024-02-09 LAB — CUP PACEART REMOTE DEVICE CHECK
Date Time Interrogation Session: 20250814015300
Implantable Pulse Generator Implant Date: 20241227
Pulse Gen Serial Number: 124096

## 2024-02-12 ENCOUNTER — Ambulatory Visit: Payer: Self-pay | Admitting: Cardiology

## 2024-02-14 ENCOUNTER — Ambulatory Visit
Admission: RE | Admit: 2024-02-14 | Discharge: 2024-02-14 | Disposition: A | Discharge status: Home / Self Care | Source: Ambulatory Visit | Attending: Emergency Medicine | Admitting: Emergency Medicine

## 2024-02-14 DIAGNOSIS — Z720 Tobacco use: Secondary | ICD-10-CM

## 2024-02-14 DIAGNOSIS — J449 Chronic obstructive pulmonary disease, unspecified: Secondary | ICD-10-CM

## 2024-02-15 ENCOUNTER — Other Ambulatory Visit: Payer: Self-pay | Admitting: Emergency Medicine

## 2024-02-15 ENCOUNTER — Other Ambulatory Visit: Payer: Self-pay | Admitting: Family Medicine

## 2024-02-15 DIAGNOSIS — K580 Irritable bowel syndrome with diarrhea: Secondary | ICD-10-CM

## 2024-02-17 ENCOUNTER — Encounter: Payer: Self-pay | Admitting: Family Medicine

## 2024-02-20 ENCOUNTER — Other Ambulatory Visit: Payer: Self-pay | Admitting: Family Medicine

## 2024-02-20 ENCOUNTER — Encounter: Payer: Self-pay | Admitting: Family Medicine

## 2024-02-20 ENCOUNTER — Encounter: Payer: Self-pay | Admitting: Neurology

## 2024-02-20 DIAGNOSIS — E1165 Type 2 diabetes mellitus with hyperglycemia: Secondary | ICD-10-CM

## 2024-02-20 DIAGNOSIS — H8113 Benign paroxysmal vertigo, bilateral: Secondary | ICD-10-CM

## 2024-02-20 DIAGNOSIS — K580 Irritable bowel syndrome with diarrhea: Secondary | ICD-10-CM

## 2024-02-20 MED ORDER — MECLIZINE HCL 25 MG PO TABS: 25.0000 mg | ORAL_TABLET | Freq: Two times a day (BID) | ORAL | 0 refills | Status: DC

## 2024-02-20 MED ORDER — PREGABALIN 150 MG PO CAPS: 150.0000 mg | ORAL_CAPSULE | Freq: Two times a day (BID) | ORAL | 5 refills | Status: AC

## 2024-02-20 MED ORDER — HYDROXYZINE HCL 10 MG PO TABS: 10.0000 mg | ORAL_TABLET | Freq: Three times a day (TID) | ORAL | 2 refills | Status: DC | PRN

## 2024-02-20 NOTE — Telephone Encounter (Signed)
 Requested Prescriptions   Pending Prescriptions Disp Refills   pregabalin  (LYRICA ) 150 MG capsule 60 capsule 5    Sig: Take 1 capsule (150 mg total) by mouth 2 (two) times daily.   Last seen 11/15/23 Next appt 06/06/24 Dispenses   Dispensed Days Supply Quantity Provider Pharmacy  LYRICA  150MG  CAPSULES 12/21/2023 30 60 each Gregg Lek, MD [facility] #...  LYRICA  150MG  CAPSULES 11/28/2023 5 10 each Gregg Lek, MD [facility] DR[facility] STORE #...  pregabalin  150 mg capsule 09/05/2023 30 60 each Penumalli, Eduard SAUNDERS, MD [facility] [place]  PREGABALIN  150MG  CAPSULES 08/11/2023 30 60 each Camara, Amadou, MD [facility] DRUG STORE #...  pregabalin  150 mg capsule 06/15/2023 30 60 each Penumalli, Vikram R, MD [facility] [place]...  pregabalin  150 mg capsule 05/16/2023 30 60 each Penumalli, Vikram R, MD [facility] [place]...  pregabalin  150 mg capsule 04/12/2023 30 60 each Penumalli, Eduard SAUNDERS, MD [facility] [place]...  pregabalin  150 mg capsule 03/14/2023 30 60 each Penumalli, Eduard SAUNDERS, MD [facility] [email or web address]

## 2024-02-21 MED ORDER — DIPHENOXYLATE-ATROPINE 2.5-0.025 MG PO TABS: 1.0000 | ORAL_TABLET | Freq: Two times a day (BID) | ORAL | 1 refills | Status: DC | PRN

## 2024-02-21 NOTE — Telephone Encounter (Signed)
 Rx for Lomotil  printed and faxed to [facility] pharmacy [phone]

## 2024-02-21 NOTE — Telephone Encounter (Signed)
 Requested Prescriptions  Signed Prescriptions Disp Refills   meclizine  (ANTIVERT ) 25 MG tablet 30 tablet 0    Sig: Take 1 tablet (25 mg total) by mouth 2 (two) times daily.     There is no refill protocol information for this order    Refused Prescriptions Disp Refills   fenofibrate  (TRICOR ) 145 MG tablet [Pharmacy Med Name: FENOFIBRATE  145MG  TABLETS] 30 tablet 0    Sig: TAKE 1 TABLET(145 MG) BY MOUTH DAILY. NEED APPOINTMENT FOR REFILLS     Cardiovascular:  Antilipid - Fibric Acid Derivatives Failed - 02/21/2024  4:43 PM      Failed - Lipid Panel in normal range within the last 12 months    Cholesterol, Total  Date Value Ref Range Status  01/26/2024 107 100 - 199 mg/dL Final   LDL Chol Calc (NIH)  Date Value Ref Range Status  01/26/2024 50 0 - 99 mg/dL Final   HDL  Date Value Ref Range Status  01/26/2024 34 (L) >39 mg/dL Final   Triglycerides  Date Value Ref Range Status  01/26/2024 129 0 - 149 mg/dL Final         Passed - ALT in normal range and within 360 days    ALT  Date Value Ref Range Status  09/15/2023 12 0 - 32 IU/L Final         Passed - AST in normal range and within 360 days    AST  Date Value Ref Range Status  09/15/2023 18 0 - 40 IU/L Final         Passed - Cr in normal range and within 360 days    Creat  Date Value Ref Range Status  07/05/2016 0.59 0.50 - 1.05 mg/dL Final    Comment:      For patients > or = 62 years of age: The upper reference limit for Creatinine is approximately 13% higher for people identified as African-American.      Creatinine, Ser  Date Value Ref Range Status  09/15/2023 0.71 0.57 - 1.00 mg/dL Final         Passed - HGB in normal range and within 360 days    Hemoglobin  Date Value Ref Range Status  09/15/2023 13.1 11.1 - 15.9 g/dL Final         Passed - HCT in normal range and within 360 days    Hematocrit  Date Value Ref Range Status  09/15/2023 41.3 34.0 - 46.6 % Final         Passed - PLT in normal range  and within 360 days    Platelets  Date Value Ref Range Status  09/15/2023 154 150 - 450 x10E3/uL Final         Passed - WBC in normal range and within 360 days    WBC  Date Value Ref Range Status  09/15/2023 7.5 3.4 - 10.8 x10E3/uL Final  04/13/2023 8.4 4.0 - 10.5 K/uL Final         Passed - eGFR is 30 or above and within 360 days    GFR, Est African American  Date Value Ref Range Status  07/05/2016 >89 >=60 mL/min Final   GFR calc Af Amer  Date Value Ref Range Status  07/11/2020 114 >59 mL/min/1.73 Final    Comment:    **In accordance with recommendations from the NKF-ASN Task force,**   [facility] is in the process of updating its eGFR calculation to the   2021 CKD-EPI creatinine equation that estimates kidney  function   without a race variable.    GFR, Est Non African American  Date Value Ref Range Status  07/05/2016 >89 >=60 mL/min Final   GFR, Estimated  Date Value Ref Range Status  04/13/2023 >60 >60 mL/min Final    Comment:    (NOTE) Calculated using the CKD-EPI Creatinine Equation (2021)    eGFR  Date Value Ref Range Status  09/15/2023 96 >59 mL/min/1.73 Final         Passed - Valid encounter within last 12 months    Recent Outpatient Visits           3 weeks ago Type 2 diabetes mellitus with hyperglycemia, with long-term current use of insulin  (HCC)   [facility] Dept Of [place]. [facility] Delbert Clam, [place]   5 months ago Type 2 diabetes mellitus with hyperglycemia, with long-term current use of insulin  [facility])   [facility] - [facility] Dept Of [place]. [facility] [place], Jon HERO, [place]   10 months ago Hospital discharge follow-up   [facility] - A Dept Of [place]. [facility] [place] Barnie NOVAK, MD   11 months ago Neck pain   [facility] - A Dept Of [place]. [facility] Delbert Clam, MD   1 year ago Smoking greater than 20 pack  years   [place] [facility] - A Dept Of [place]. [facility] Delbert Clam, MD       Future Appointments             In 5 months Delbert Clam, MD [facility] - A Dept Of [place]. [facility]   In 6 months Sandridge, Brenda K, [name]  Dermatology

## 2024-02-25 ENCOUNTER — Emergency Department (HOSPITAL_COMMUNITY)
Admission: EM | Admit: 2024-02-25 | Discharge: 2024-02-25 | Disposition: A | Discharge status: Home / Self Care | Attending: Emergency Medicine | Admitting: Emergency Medicine

## 2024-02-25 ENCOUNTER — Encounter (HOSPITAL_COMMUNITY): Payer: Self-pay | Admitting: *Deleted

## 2024-02-25 ENCOUNTER — Other Ambulatory Visit: Payer: Self-pay

## 2024-02-25 ENCOUNTER — Emergency Department (HOSPITAL_COMMUNITY)

## 2024-02-25 DIAGNOSIS — I110 Hypertensive heart disease with heart failure: Secondary | ICD-10-CM | POA: Insufficient documentation

## 2024-02-25 DIAGNOSIS — E1165 Type 2 diabetes mellitus with hyperglycemia: Secondary | ICD-10-CM | POA: Insufficient documentation

## 2024-02-25 DIAGNOSIS — R519 Headache, unspecified: Secondary | ICD-10-CM | POA: Diagnosis not present

## 2024-02-25 DIAGNOSIS — J449 Chronic obstructive pulmonary disease, unspecified: Secondary | ICD-10-CM | POA: Diagnosis not present

## 2024-02-25 DIAGNOSIS — R42 Dizziness and giddiness: Secondary | ICD-10-CM | POA: Diagnosis present

## 2024-02-25 DIAGNOSIS — Z7902 Long term (current) use of antithrombotics/antiplatelets: Secondary | ICD-10-CM | POA: Diagnosis not present

## 2024-02-25 DIAGNOSIS — Z8673 Personal history of transient ischemic attack (TIA), and cerebral infarction without residual deficits: Secondary | ICD-10-CM | POA: Insufficient documentation

## 2024-02-25 DIAGNOSIS — D72829 Elevated white blood cell count, unspecified: Secondary | ICD-10-CM | POA: Insufficient documentation

## 2024-02-25 DIAGNOSIS — Z794 Long term (current) use of insulin: Secondary | ICD-10-CM | POA: Insufficient documentation

## 2024-02-25 DIAGNOSIS — Z79899 Other long term (current) drug therapy: Secondary | ICD-10-CM | POA: Insufficient documentation

## 2024-02-25 DIAGNOSIS — I509 Heart failure, unspecified: Secondary | ICD-10-CM | POA: Insufficient documentation

## 2024-02-25 HISTORY — DX: Cerebral infarction, unspecified: I63.9

## 2024-02-25 LAB — COMPREHENSIVE METABOLIC PANEL WITH GFR
ALT: 35 U/L (ref 0–44)
AST: 31 U/L (ref 15–41)
Albumin: 3.7 g/dL (ref 3.5–5.0)
Alkaline Phosphatase: 92 U/L (ref 38–126)
Anion gap: 8 (ref 5–15)
BUN: 10 mg/dL (ref 8–23)
CO2: 22 mmol/L (ref 22–32)
Calcium: 8.8 mg/dL — ABNORMAL LOW (ref 8.9–10.3)
Chloride: 106 mmol/L (ref 98–111)
Creatinine, Ser: 0.59 mg/dL (ref 0.44–1.00)
GFR, Estimated: >60 mL/min (ref >60)
Glucose, Bld: 173 mg/dL — ABNORMAL HIGH (ref 70–99)
Potassium: 3.7 mmol/L (ref 3.5–5.1)
Sodium: 136 mmol/L (ref 135–145)
Total Bilirubin: 0.4 mg/dL (ref 0.0–1.2)
Total Protein: 6.8 g/dL (ref 6.5–8.1)

## 2024-02-25 LAB — I-STAT CHEM 8, ED
BUN: 11 mg/dL (ref 8–23)
Calcium, Ion: 1.11 mmol/L — ABNORMAL LOW (ref 1.15–1.40)
Chloride: 106 mmol/L (ref 98–111)
Creatinine, Ser: 0.6 mg/dL (ref 0.44–1.00)
Glucose, Bld: 168 mg/dL — ABNORMAL HIGH (ref 70–99)
HCT: 44 % (ref 36.0–46.0)
Hemoglobin: 15 g/dL (ref 12.0–15.0)
Potassium: 3.7 mmol/L (ref 3.5–5.1)
Sodium: 140 mmol/L (ref 135–145)
TCO2: 24 mmol/L (ref 22–32)

## 2024-02-25 LAB — DIFFERENTIAL
Abs Immature Granulocytes: 0.04 K/uL (ref 0.00–0.07)
Basophils Absolute: 0.1 K/uL (ref 0.0–0.1)
Basophils Relative: 1 %
Eosinophils Absolute: 0.3 K/uL (ref 0.0–0.5)
Eosinophils Relative: 3 %
Immature Granulocytes: 0 %
Lymphocytes Relative: 22 %
Lymphs Abs: 2.4 K/uL (ref 0.7–4.0)
Monocytes Absolute: 0.5 K/uL (ref 0.1–1.0)
Monocytes Relative: 5 %
Neutro Abs: 7.4 K/uL (ref 1.7–7.7)
Neutrophils Relative %: 69 %

## 2024-02-25 LAB — CBC
HCT: 44.9 % (ref 36.0–46.0)
Hemoglobin: 14.5 g/dL (ref 12.0–15.0)
MCH: 27.9 pg (ref 26.0–34.0)
MCHC: 32.3 g/dL (ref 30.0–36.0)
MCV: 86.3 fL (ref 80.0–100.0)
Platelets: 154 K/uL (ref 150–400)
RBC: 5.2 MIL/uL — ABNORMAL HIGH (ref 3.87–5.11)
RDW: 13.5 % (ref 11.5–15.5)
WBC: 10.7 K/uL — ABNORMAL HIGH (ref 4.0–10.5)
nRBC: 0 % (ref 0.0–0.2)

## 2024-02-25 LAB — ETHANOL: Alcohol, Ethyl (B): <15 mg/dL (ref <15)

## 2024-02-25 LAB — APTT: aPTT: 32 s (ref 24–36)

## 2024-02-25 LAB — TROPONIN I (HIGH SENSITIVITY): Troponin I (High Sensitivity): 4 ng/L (ref <18)

## 2024-02-25 LAB — PROTIME-INR
INR: 1 (ref 0.8–1.2)
Prothrombin Time: 13.5 s (ref 11.4–15.2)

## 2024-02-25 LAB — CBG MONITORING, ED: Glucose-Capillary: 187 mg/dL — ABNORMAL HIGH (ref 70–99)

## 2024-02-25 MED ORDER — METOCLOPRAMIDE HCL 5 MG/ML IJ SOLN
10.0000 mg | Freq: Once | INTRAMUSCULAR | Status: AC
  Administered 2024-02-25: 10 mg via INTRAVENOUS
  Filled 2024-02-25: qty 2

## 2024-02-25 MED ORDER — SODIUM CHLORIDE 0.9% FLUSH: 3.0000 mL | Freq: Once | INTRAVENOUS | Status: DC

## 2024-02-25 MED ORDER — SODIUM CHLORIDE 0.9 % IV BOLUS
1000.0000 mL | Freq: Once | INTRAVENOUS | Status: AC
  Administered 2024-02-25: 1000 mL via INTRAVENOUS

## 2024-02-25 MED ORDER — DIPHENHYDRAMINE HCL 25 MG PO CAPS
25.0000 mg | ORAL_CAPSULE | Freq: Once | ORAL | Status: AC
  Administered 2024-02-25: 25 mg via ORAL
  Filled 2024-02-25: qty 1

## 2024-02-25 MED ORDER — LORAZEPAM 1 MG PO TABS
1.0000 mg | ORAL_TABLET | Freq: Once | ORAL | Status: AC
  Administered 2024-02-25: 1 mg via ORAL
  Filled 2024-02-25: qty 1

## 2024-02-25 NOTE — ED Notes (Signed)
 Patient is allergic to adhesive requires paper tape

## 2024-02-25 NOTE — Discharge Instructions (Signed)
 Your brain MRI today was normal.  Follow-up with your doctor as needed

## 2024-02-25 NOTE — ED Provider Notes (Addendum)
 [place] EMERGENCY DEPARTMENT AT [facility] Provider Note   CSN: [id number] Arrival date & time: 02/25/24  1202     Patient presents with: Dizziness and Hallucinations   [name] is a 63 y.o. female.    Dizziness  Patient is a 62 year old female with a past medical history significant for hypertension, COPD, CHF, A-fib, stroke, diabetes  She presents emergency room today with complaints of dizziness and some dysequilibrium on starting yesterday associated with left-sided occipital headaches some nausea and nonbloody nonbilious emesis.  Seems that she has had significant pain of her headache.  Has not taken any medications today.  No neck stiffness or fevers.  According to daughter patient has also been having some visual hallucinations which is new for this patient.  She denies any limb weakness or slurring of her speech.  No head injuries.  She is not on anticoagulation but does take Plavix     Prior to Admission medications   Medication Sig Start Date End Date Taking? Authorizing Provider  albuterol  (VENTOLIN  HFA) 108 (90 Base) MCG/ACT inhaler Inhale 2 puffs into the lungs every 4 (four) hours as needed for wheezing or shortness of breath. 12/15/23   Shelah Lamar RAMAN, MD  amitriptyline  (ELAVIL ) 75 MG tablet Take 1 tablet (75 mg total) by mouth at bedtime. 11/15/23 11/09/24  Camara, Amadou, MD  Blood Glucose Monitoring Suppl (ACCU-CHEK GUIDE ME) w/Device KIT Use to check blood sugar TID. 01/30/21   Newlin, Enobong, MD  cetirizine  (ZYRTEC ) 10 MG tablet Take 1 tablet (10 mg total) by mouth daily as needed for allergies. Patient not taking: Reported on 01/26/2024 09/15/23   Danton Jon HERO, [name]  clobetasol  ointment (TEMOVATE ) 0.05 % Apply 1 Application topically 2 (two) times daily. For 14 days 01/26/24   Chrzanowski, Jami B, NP  clopidogrel  (PLAVIX ) 75 MG tablet Take 1 tablet (75 mg total) by mouth daily. 01/26/24   Newlin, Enobong, MD  conjugated estrogens   (PREMARIN ) vaginal cream Place 1 Applicatorful vaginally 2 (two) times a week. 01/26/24   Chrzanowski, Jami B, NP  dapagliflozin  propanediol (FARXIGA ) 10 MG TABS tablet Take 1 tablet (10 mg total) by mouth daily before breakfast. 05/24/23   Williams, Evan, [name]  dicyclomine  (BENTYL ) 10 MG capsule Take 4 times daily 30 minutes before meals and at bedtime. 11/11/23   Beather Delon Gibson, PA  diphenoxylate -atropine  (LOMOTIL ) 2.5-0.025 MG tablet Take 1 tablet by mouth 2 (two) times daily as needed for diarrhea or loose stools. 02/21/24   Beather Delon Gibson, PA  doxycycline  (VIBRA -TABS) 100 MG tablet Take 1 tablet (100 mg total) by mouth 2 (two) times daily. Patient not taking: Reported on 01/26/2024 01/26/24   Newlin, Enobong, MD  FLUoxetine  (PROZAC ) 40 MG capsule Take 1 capsule (40 mg total) by mouth daily. 01/26/24   Newlin, Enobong, MD  hydrOXYzine  (ATARAX ) 10 MG tablet Take 1 tablet (10 mg total) by mouth 3 (three) times daily as needed. 02/20/24   Newlin, Enobong, MD  insulin  aspart (NOVOLOG  FLEXPEN) 100 UNIT/ML FlexPen Inject 15 Units into the skin 3 (three) times daily with meals. 01/26/24   Newlin, Enobong, MD  insulin  glargine (LANTUS  SOLOSTAR) 100 UNIT/ML Solostar Pen Inject 40 Units into the skin at bedtime. 01/26/24   Newlin, Enobong, MD  meclizine  (ANTIVERT ) 25 MG tablet Take 1 tablet (25 mg total) by mouth 2 (two) times daily. 02/20/24   Newlin, Enobong, MD  metoprolol  succinate (TOPROL -XL) 25 MG 24 hr tablet Take 1 tablet (25 mg total) by mouth daily.  01/26/24   Newlin, Enobong, MD  mupirocin  ointment (BACTROBAN ) 2 % Apply 1 Application topically 2 (two) times daily. Patient not taking: Reported on 01/26/2024 06/03/22   Newlin, Enobong, MD  omeprazole  (PRILOSEC) 40 MG capsule Take 1 capsule (40 mg total) by mouth daily. 01/26/24   Newlin, Enobong, MD  pregabalin  (LYRICA ) 150 MG capsule Take 1 capsule (150 mg total) by mouth 2 (two) times daily. 02/20/24   Gregg Lek, MD  promethazine   (PHENERGAN ) 25 MG tablet 1/2-1 tab every 4 to 6 hours prn nausea Patient not taking: Reported on 01/26/2024 03/08/22   Newlin, Enobong, MD  rizatriptan  (MAXALT -MLT) 10 MG disintegrating tablet Take 1 tablet (10 mg total) by mouth as needed for migraine. May repeat in 2 hours if needed Patient not taking: Reported on 01/26/2024 11/29/23   Camara, Amadou, MD  rosuvastatin  (CRESTOR ) 20 MG tablet Take 1 tablet (20 mg total) by mouth daily. 01/26/24 01/25/25  Newlin, Enobong, MD  SPIRIVA  RESPIMAT 1.25 MCG/ACT AERS INHALE 2 PUFFS INTO THE LUNGS DAILY 02/15/24   Shelah Lamar RAMAN, MD  spironolactone  (ALDACTONE ) 25 MG tablet Take 0.5 tablets (12.5 mg total) by mouth daily. 01/26/24 04/25/24  Newlin, Enobong, MD  traZODone  (DESYREL ) 50 MG tablet Take 1 tablet (50 mg total) by mouth at bedtime as needed for sleep. 01/26/24   Newlin, Enobong, MD  vancomycin  (VANCOCIN ) 125 MG capsule Take 125 mg by mouth 4 (four) times daily. Patient not taking: Reported on 01/26/2024    [provider]    Allergies: Biaxin [clarithromycin], Lisinopril, Sulfa antibiotics, Chlorthalidone, Daptomycin, Latex, Amoxicillin -pot clavulanate, Aspirin , Cholestyramine, Dilaudid  [hydromorphone  hcl], and Lasix [furosemide]    Review of Systems  Neurological:  Positive for dizziness.    Updated Vital Signs BP 110/73 (BP Location: Left Arm)   Pulse 80   Temp 98.5 F (36.9 C) (Oral)   Resp 15   Ht 5' 8 (1.727 m)   Wt 68 kg   LMP 06/09/2011 Comment: verified BEFORE imaging  SpO2 100%   BMI 22.81 kg/m   Physical Exam Vitals and nursing note reviewed.  Constitutional:      General: She is not in acute distress. HENT:     Head: Normocephalic and atraumatic.     Nose: Nose normal.  Eyes:     General: No scleral icterus. Cardiovascular:     Rate and Rhythm: Normal rate and regular rhythm.     Pulses: Normal pulses.     Heart sounds: Normal heart sounds.  Pulmonary:     Effort: Pulmonary effort is normal. No respiratory  distress.     Breath sounds: No wheezing.  Abdominal:     Palpations: Abdomen is soft.     Tenderness: There is no abdominal tenderness.  Musculoskeletal:     Cervical back: Normal range of motion.     Right lower leg: No edema.     Left lower leg: No edema.  Skin:    General: Skin is warm and dry.     Capillary Refill: Capillary refill takes less than 2 seconds.  Neurological:     Mental Status: She is alert. Mental status is at baseline.     Comments: Alert and oriented to self, place, time and event.   Speech is fluent, clear without dysarthria or dysphasia.   Strength 5/5 in upper/lower extremities   Sensation intact in upper/lower extremities    CN I not tested  CN II grossly intact visual fields bilaterally. Did not visualize posterior eye.  CN III,  IV, VI PERRLA and EOMs intact bilaterally  CN V Intact sensation to sharp and light touch to the face  CN VII facial movements symmetric  CN VIII not tested  CN IX, X no uvula deviation, symmetric rise of soft palate  CN XI 5/5 SCM and trapezius strength bilaterally  CN XII Midline tongue protrusion, symmetric L/R movements   Psychiatric:        Mood and Affect: Mood normal.        Behavior: Behavior normal.     (all labs ordered are listed, but only abnormal results are displayed) Labs Reviewed  CBC - Abnormal; Notable for the following components:      Result Value   WBC 10.7 (*)    RBC 5.20 (*)    All other components within normal limits  COMPREHENSIVE METABOLIC PANEL WITH GFR - Abnormal; Notable for the following components:   Glucose, Bld 173 (*)    Calcium  8.8 (*)    All other components within normal limits  I-STAT CHEM 8, ED - Abnormal; Notable for the following components:   Glucose, Bld 168 (*)    Calcium , Ion 1.11 (*)    All other components within normal limits  CBG MONITORING, ED - Abnormal; Notable for the following components:   Glucose-Capillary 187 (*)    All other components within normal  limits  PROTIME-INR  APTT  DIFFERENTIAL  ETHANOL  TROPONIN I (HIGH SENSITIVITY)  TROPONIN I (HIGH SENSITIVITY)    EKG: EKG Interpretation Date/Time:  Saturday February 25 2024 12:58:10 EDT Ventricular Rate:  82 PR Interval:  170 QRS Duration:  140 QT Interval:  447 QTC Calculation: 523 R Axis:   -44  Text Interpretation: Sinus rhythm Left bundle branch block Confirmed by Francesca Fallow ([id number]) on 02/25/2024 2:01:29 PM  Radiology: CT Head Wo Contrast Result Date: 02/25/2024 CLINICAL DATA:  Headache, neuro deficit dizziness, no focal neuro deficit EXAM: CT HEAD WITHOUT CONTRAST TECHNIQUE: Contiguous axial images were obtained from the base of the skull through the vertex without intravenous contrast. RADIATION DOSE REDUCTION: This exam was performed according to the departmental dose-optimization program which includes automated exposure control, adjustment of the mA and/or kV according to patient size and/or use of iterative reconstruction technique. COMPARISON:  CT head April 13, 2023. FINDINGS: Brain: No evidence of acute infarction, hemorrhage, hydrocephalus, extra-axial collection or mass lesion/mass effect. Small remote cerebellar infarcts. Vascular: No hyperdense vessel. Skull: No acute fracture. Sinuses/Orbits: Opacified left maxillary sinus. No acute orbital findings. Other: No mastoid effusions. IMPRESSION: 1. No evidence of acute intracranial abnormality. 2. Opacified left maxillary sinus. Electronically Signed   By: Gilmore GORMAN Molt M.D.   On: 02/25/2024 14:23     Procedures   Medications Ordered in the ED  sodium chloride  flush (NS) 0.9 % injection 3 mL (has no administration in time range)  metoCLOPramide  (REGLAN ) injection 10 mg (10 mg Intravenous Given 02/25/24 1314)  diphenhydrAMINE  (BENADRYL ) capsule 25 mg (25 mg Oral Given 02/25/24 1314)  sodium chloride  0.9 % bolus 1,000 mL (0 mLs Intravenous Stopped 02/25/24 1448)  LORazepam  (ATIVAN ) tablet 1 mg (1 mg Oral Given  02/25/24 1451)                                    Medical Decision Making Amount and/or Complexity of Data Reviewed Labs: ordered. Radiology: ordered.  Risk Prescription drug management.    This patient presents to the ED for  concern of headache and off balance, this involves a number of treatment options, and is a complaint that carries with it a moderate to high risk of complications and morbidity. A differential diagnosis was considered for the patient's symptoms which is discussed below:   Emergent considerations for headache include subarachnoid hemorrhage, meningitis, temporal arteritis, glaucoma, cerebral ischemia, carotid/vertebral dissection, intracranial tumor, Venous sinus thrombosis, carbon monoxide poisoning, acute or chronic subdural hemorrhage.  Other considerations include: Migraine, Cluster headache, Hypertension, Caffeine, alcohol, or drug withdrawal, Pseudotumor cerebri, Arteriovenous malformation, Head injury, Neurocysticercosis, Post-lumbar puncture, Preeclampsia, Tension headache, Sinusitis, Cervical arthritis, Refractive error causing strain, Dental abscess, Otitis media, Temporomandibular joint syndrome, Depression, Somatoform disorder (eg, somatization) Trigeminal neuralgia, Glossopharyngeal neuralgia.  Patient also having some gait issues/off-balance sensations.  Concern for primarily stroke.   Co morbidities: Discussed in HPI   Brief History:  Patient is a 63 year old female with a past medical history significant for hypertension, COPD, CHF, A-fib, stroke, diabetes  She presents emergency room today with complaints of dizziness and some disorientation on starting yesterday associated with left-sided occipital headaches some nausea and nonbloody nonbilious emesis.  Seems that she has had significant pain of her headache.  Has not taken any medications today.  No neck stiffness or fevers.  According to daughter patient has also been having some visual  hallucinations which is new for this patient.  She denies any limb weakness or slurring of her speech.  No head injuries.  She is not on anticoagulation but does take Plavix       EMR reviewed including pt PMHx, past surgical history and past visits to ER.   See HPI for more details   Lab Tests:   I ordered and independently interpreted labs. Labs notable for Coags normal, troponin x 1 normal, CBC with mild nonspecific leukocytosis of 10.7 CMP unremarkable mild hyperglycemia  Imaging Studies:  NAD. I personally reviewed all imaging studies and no acute abnormality found. I agree with radiology interpretation. CT head unremarkable I agree with radiology read.  MRI brain without contrast ordered and pending at time of shift change   Cardiac Monitoring:  The patient was maintained on a cardiac monitor.  I personally viewed and interpreted the cardiac monitored which showed an underlying rhythm of: NSR with left bundle branch block EKG non-ischemic   Medicines ordered:  I ordered medication including Reglan , Benadryl , 1 L normal saline, Ativan  p.o. 1 mg for headache, nausea, anxiety Reevaluation of the patient after these medicines showed that the patient improved I have reviewed the patients home medicines and have made adjustments as needed   Critical Interventions:     Consults/Attending Physician   I discussed this case with my attending physician who cosigned this note including patient's presenting symptoms, physical exam, and planned diagnostics and interventions. Attending physician stated agreement with plan or made changes to plan which were implemented.   Reevaluation:  After the interventions noted above I re-evaluated patient and found that they have :improved   Social Determinants of Health:      Problem List / ED Course:  Patient with headaches over the past few weeks seems she is has some disequilibrium over the past 2 days with new headaches  had a single visual hallucination this morning.  Alert and oriented, neurologically intact, well-appearing on exam.  MRI pending at time of shift change to evaluate for possible new stroke.  Notably patient does have risk factors for stroke, history of A-fib not on Eliquis.   Dispostion:  Pt care  given to PM team   Final diagnoses:  Dysequilibrium  Acute nonintractable headache, unspecified headache type    ED Discharge Orders     None          Neldon Hamp RAMAN, [place] 02/25/24 1516    Neldon Hamp RAMAN, [place] 02/25/24 1535    Francesca Elsie CROME, MD 02/25/24 [phone]

## 2024-02-25 NOTE — ED Provider Notes (Signed)
 Patient signed to me by prior provider.  MRI brain shows no acute findings.  Headache is improved.  Will discharge home   Dasie Faden, MD 02/25/24 1921

## 2024-02-25 NOTE — ED Triage Notes (Addendum)
 PT states she woke up dizzy on Thursday and started vomiting. Also c/o difficulty ambulating and L occipital headache.. The symptoms have persisted.  Last night, she called her daughter and said there was a woman with a child at the end of her bed. There was not.  Dx with strokes in Dec. AO x 4 presently.

## 2024-02-25 NOTE — ED Notes (Signed)
 Oxygen  sensor malfunction placed on forehead.

## 2024-03-08 ENCOUNTER — Ambulatory Visit: Admitting: Emergency Medicine

## 2024-03-08 ENCOUNTER — Other Ambulatory Visit: Payer: Self-pay | Admitting: Emergency Medicine

## 2024-03-08 DIAGNOSIS — J069 Acute upper respiratory infection, unspecified: Secondary | ICD-10-CM

## 2024-03-08 DIAGNOSIS — J449 Chronic obstructive pulmonary disease, unspecified: Secondary | ICD-10-CM

## 2024-03-12 ENCOUNTER — Encounter

## 2024-03-12 ENCOUNTER — Ambulatory Visit (INDEPENDENT_AMBULATORY_CARE_PROVIDER_SITE_OTHER)

## 2024-03-12 ENCOUNTER — Ambulatory Visit (HOSPITAL_BASED_OUTPATIENT_CLINIC_OR_DEPARTMENT_OTHER): Admitting: Pulmonary Disease

## 2024-03-12 ENCOUNTER — Other Ambulatory Visit: Payer: Self-pay | Admitting: Family Medicine

## 2024-03-12 ENCOUNTER — Encounter (HOSPITAL_BASED_OUTPATIENT_CLINIC_OR_DEPARTMENT_OTHER): Payer: Self-pay | Admitting: Pulmonary Disease

## 2024-03-12 VITALS — BP 113/67 | HR 102 | Ht 68.0 in | Wt 150.9 lb

## 2024-03-12 DIAGNOSIS — R051 Acute cough: Secondary | ICD-10-CM | POA: Diagnosis not present

## 2024-03-12 DIAGNOSIS — J441 Chronic obstructive pulmonary disease with (acute) exacerbation: Secondary | ICD-10-CM

## 2024-03-12 DIAGNOSIS — G459 Transient cerebral ischemic attack, unspecified: Secondary | ICD-10-CM

## 2024-03-12 DIAGNOSIS — L0292 Furuncle, unspecified: Secondary | ICD-10-CM

## 2024-03-12 DIAGNOSIS — H8113 Benign paroxysmal vertigo, bilateral: Secondary | ICD-10-CM

## 2024-03-12 MED ORDER — PREDNISONE 10 MG PO TABS: ORAL_TABLET | ORAL | 0 refills | Status: AC

## 2024-03-12 MED ORDER — DOXYCYCLINE HYCLATE 100 MG PO TABS: 100.0000 mg | ORAL_TABLET | Freq: Two times a day (BID) | ORAL | 0 refills | Status: AC

## 2024-03-12 NOTE — Progress Notes (Unsigned)
 Subjective:   PATIENT ID: [name] GENDER: female DOB: 07/01/1960, MRN: [id number]  Chief Complaint  Patient presents with   Acute Visit    Wants CT scan results    Reason for Visit: Acute visit for cough  63 year old female former smoker (quit in 3 months ago) with COPD, chronic systoliuc heart failure, atrial fibrillation who presents with 3 day hx of cough. Reports nonproductive cough associated with occasional wheezing and shortness of breath. Denies fevers but sweating at night. Reports nausea and diarrhea x 2 days. Reports headaches. Denies myalgias.   I have personally reviewed patient's past medical/family/social history, allergies, current medications.  Past Medical History:  Diagnosis Date   Anxiety    Arthritis    Chronic systolic heart failure (HCC)    EF 20-25% ECHO 05/2015   COPD (chronic obstructive pulmonary disease) (HCC)    Depression    Diabetes mellitus without complication (HCC)    Type II   Dysrhythmia    pt unsure of name of arrythmia -  my heart rate will drop all of a sudden -no current treatment - atrial flutter   Fibrillation, atrial (HCC)    Gallstones    GERD (gastroesophageal reflux disease)    Headache(784.0)    migraines   Hypertension    Osteomyelitis (HCC)    R ankle   Rash    arms   Stroke (HCC)      Family History  Problem Relation Age of Onset   Diabetes Mother    Lung cancer Father    Heart attack Father 64       CABG x4   Breast cancer Sister        Survivor      Social History   Occupational History   Not on file  Tobacco Use   Smoking status: Former    Current packs/day: 0.00    Average packs/day: 1 pack/day for 56.0 years (56.0 ttl pk-yrs)    Types: Cigarettes, E-cigarettes    Start date: 09/08/1981    Quit date: 12/15/2023    Years since quitting: 0.2    Passive exposure: Past   Smokeless tobacco: Never   Tobacco comments:    Quit smoking 6/3- updated 12/15/2023, VAPES--no nicotine   Vaping Use    Vaping status: Every Day   Start date: 10/12/2016  Substance and Sexual Activity   Alcohol use: No    Alcohol/week: 0.0 standard drinks of alcohol   Drug use: No   Sexual activity: Not Currently    Partners: Male    Birth control/protection: Post-menopausal    Comment: menarche 63yo, sexual debut 63yo    Allergies  Allergen Reactions   Biaxin [Clarithromycin] Shortness Of Breath and Swelling   Lisinopril Swelling and Cough    Lip edema   Sulfa Antibiotics Anaphylaxis, Shortness Of Breath, Swelling and Hypertension   Chlorthalidone Nausea And Vomiting and Other (See Comments)    Clammy, Tachycardia, Headache    Daptomycin Nausea And Vomiting   Latex Hives and Rash   Amoxicillin -Pot Clavulanate Diarrhea    Has patient had a PCN reaction causing immediate rash, facial/tongue/throat swelling, SOB or lightheadedness with hypotension:No Has patient had a PCN reaction causing severe rash involving mucus membranes or skin necrosis:No Has patient had a PCN reaction that required hospitalization:No Has patient had a PCN reaction occurring within THE LAST 10 YEARS.  #  #  #  YES  #  #  #  If all of the above  answers are NO, then may proceed with Cephalosporin use.    Aspirin  Nausea And Vomiting and Rash    On 325mg  dosage   Cholestyramine Nausea And Vomiting   Dilaudid  [Hydromorphone  Hcl] Nausea And Vomiting   Lasix [Furosemide] Nausea And Vomiting and Other (See Comments)    Headache      Outpatient Medications Prior to Visit  Medication Sig Dispense Refill   albuterol  (VENTOLIN  HFA) 108 (90 Base) MCG/ACT inhaler INHALE 2 PUFFS INTO THE LUNGS EVERY 4 HOURS AS NEEDED FOR WHEEZING OR SHORTNESS OF BREATH 54 g 8   amitriptyline  (ELAVIL ) 75 MG tablet Take 1 tablet (75 mg total) by mouth at bedtime. 90 tablet 3   Blood Glucose Monitoring Suppl (ACCU-CHEK GUIDE ME) w/Device KIT Use to check blood sugar TID. 1 kit 0   cetirizine  (ZYRTEC ) 10 MG tablet Take 1 tablet (10 mg total) by mouth  daily as needed for allergies. 90 tablet 3   clobetasol  ointment (TEMOVATE ) 0.05 % Apply 1 Application topically 2 (two) times daily. For 14 days 30 g 0   clopidogrel  (PLAVIX ) 75 MG tablet Take 1 tablet (75 mg total) by mouth daily. 90 tablet 1   conjugated estrogens  (PREMARIN ) vaginal cream Place 1 Applicatorful vaginally 2 (two) times a week. 42.5 g 1   dapagliflozin  propanediol (FARXIGA ) 10 MG TABS tablet Take 1 tablet (10 mg total) by mouth daily before breakfast. 90 tablet 3   dicyclomine  (BENTYL ) 10 MG capsule Take 4 times daily 30 minutes before meals and at bedtime. 120 capsule 5   diphenoxylate -atropine  (LOMOTIL ) 2.5-0.025 MG tablet Take 1 tablet by mouth 2 (two) times daily as needed for diarrhea or loose stools. 60 tablet 1   FLUoxetine  (PROZAC ) 40 MG capsule Take 1 capsule (40 mg total) by mouth daily. 90 capsule 1   hydrOXYzine  (ATARAX ) 10 MG tablet Take 1 tablet (10 mg total) by mouth 3 (three) times daily as needed. 60 tablet 2   insulin  aspart (NOVOLOG  FLEXPEN) 100 UNIT/ML FlexPen Inject 15 Units into the skin 3 (three) times daily with meals. 30 mL 1   insulin  glargine (LANTUS  SOLOSTAR) 100 UNIT/ML Solostar Pen Inject 40 Units into the skin at bedtime. 15 mL 1   meclizine  (ANTIVERT ) 25 MG tablet Take 1 tablet (25 mg total) by mouth 2 (two) times daily. 30 tablet 0   metoprolol  succinate (TOPROL -XL) 25 MG 24 hr tablet Take 1 tablet (25 mg total) by mouth daily. 90 tablet 1   omeprazole  (PRILOSEC) 40 MG capsule Take 1 capsule (40 mg total) by mouth daily. 90 capsule 1   pregabalin  (LYRICA ) 150 MG capsule Take 1 capsule (150 mg total) by mouth 2 (two) times daily. 60 capsule 5   promethazine  (PHENERGAN ) 25 MG tablet 1/2-1 tab every 4 to 6 hours prn nausea 20 tablet 0   rosuvastatin  (CRESTOR ) 20 MG tablet Take 1 tablet (20 mg total) by mouth daily. 90 tablet 1   SPIRIVA  RESPIMAT 1.25 MCG/ACT AERS INHALE 2 PUFFS INTO THE LUNGS DAILY 4 g 5   spironolactone  (ALDACTONE ) 25 MG tablet Take  0.5 tablets (12.5 mg total) by mouth daily. 45 tablet 1   traZODone  (DESYREL ) 50 MG tablet Take 1 tablet (50 mg total) by mouth at bedtime as needed for sleep. 90 tablet 1   doxycycline  (VIBRA -TABS) 100 MG tablet Take 1 tablet (100 mg total) by mouth 2 (two) times daily. (Patient not taking: Reported on 03/12/2024) 20 tablet 0   mupirocin  ointment (BACTROBAN ) 2 % Apply 1  Application topically 2 (two) times daily. (Patient not taking: Reported on 03/12/2024) 22 g 1   rizatriptan  (MAXALT -MLT) 10 MG disintegrating tablet Take 1 tablet (10 mg total) by mouth as needed for migraine. May repeat in 2 hours if needed (Patient not taking: Reported on 03/12/2024) 9 tablet 11   vancomycin  (VANCOCIN ) 125 MG capsule Take 125 mg by mouth 4 (four) times daily. (Patient not taking: Reported on 03/12/2024)     No facility-administered medications prior to visit.    Review of Systems  Constitutional:  Negative for chills, diaphoresis, fever, malaise/fatigue and weight loss.  HENT:  Negative for congestion.   Respiratory:  Positive for cough, shortness of breath and wheezing. Negative for hemoptysis and sputum production.   Cardiovascular:  Negative for chest pain, palpitations and leg swelling.  Gastrointestinal:  Positive for diarrhea.     Objective:   Vitals:   03/12/24 1443  BP: 113/67  Pulse: (!) 102  SpO2: 93%  Weight: 150 lb 14.4 oz (68.4 kg)  Height: 5' 8 (1.727 m)   SpO2: 93 %  Physical Exam: General: Well-appearing, no acute distress HENT: [place], AT Eyes: EOMI, no scleral icterus Respiratory: Diminished to auscultation bilaterally.  No crackles, wheezing or rales Cardiovascular: RRR, -M/R/G, no JVD Extremities:-Edema,-tenderness Neuro: AAO x4, CNII-XII grossly intact Psych: Normal mood, normal affect  Data Reviewed:  Imaging: CT lung screen 02/14/24 - Mild paraseptal and centrilobular emphysema, chronic bronchitis, stable 3.8 mm in right lower lobe, new bandlike in left lower lobe       Assessment & Plan:   Discussion: HPI  COPD exacerbation, likely viral --Prednisone  taper and doxycycline   CT lung screen --Enrolled. Reviewed in-office. Continue repeat scan in 1 year  Health Maintenance Immunization History  Administered Date(s) Administered   Hepatitis B, ADULT 02/12/2008, 03/15/2008, 03/10/2009   Influenza, Seasonal, Injecte, Preservative Fre 03/16/2023   Influenza,inj,Quad PF,6+ Mos 08/07/2015, 06/25/2016, 05/25/2017, 06/02/2018, 03/09/2021   PNEUMOCOCCAL CONJUGATE-20 03/09/2021   Pneumococcal Polysaccharide-23 04/03/2012, 05/25/2017   Tdap 09/29/2006   No orders of the defined types were placed in this encounter. No orders of the defined types were placed in this encounter.   No follow-ups on file.  I have spent a total time of 32-minutes on the day of the appointment reviewing prior documentation, coordinating care and discussing medical diagnosis and plan with the patient/family. Imaging, labs and tests included in this note have been reviewed and interpreted independently by me.  Nikeia Henkes Slater Staff, MD [facility] 03/12/2024 3:18 PM

## 2024-03-12 NOTE — Patient Instructions (Addendum)
  COPD exacerbation, likely viral --Prednisone  taper and doxycycline   CT lung screen --Enrolled. Reviewed in-office. Continue repeat scan in 1 year

## 2024-03-13 LAB — CUP PACEART REMOTE DEVICE CHECK
Date Time Interrogation Session: 20250915015600
Implantable Pulse Generator Implant Date: 20241227
Pulse Gen Serial Number: 124096

## 2024-03-16 ENCOUNTER — Encounter (HOSPITAL_BASED_OUTPATIENT_CLINIC_OR_DEPARTMENT_OTHER): Payer: Self-pay | Admitting: Pulmonary Disease

## 2024-03-18 ENCOUNTER — Ambulatory Visit: Payer: Self-pay | Admitting: Cardiology

## 2024-03-19 NOTE — Progress Notes (Signed)
 Remote Loop Recorder Transmission

## 2024-03-21 ENCOUNTER — Other Ambulatory Visit (HOSPITAL_BASED_OUTPATIENT_CLINIC_OR_DEPARTMENT_OTHER): Payer: Self-pay | Admitting: Pulmonary Disease

## 2024-03-21 DIAGNOSIS — L0292 Furuncle, unspecified: Secondary | ICD-10-CM

## 2024-03-21 NOTE — Progress Notes (Signed)
 Remote Loop Recorder Transmission

## 2024-03-22 NOTE — Telephone Encounter (Signed)
 Please advise as pt just got earlier this month

## 2024-03-23 NOTE — Telephone Encounter (Signed)
 Please call patient regarding current symptoms. If needing additional evaluation for treatment she will need acute visit or message to be sent to doc of day. This is [name] patient.

## 2024-03-23 NOTE — Telephone Encounter (Signed)
 Left pt message to return call to find out more information

## 2024-04-01 ENCOUNTER — Other Ambulatory Visit: Payer: Self-pay | Admitting: Family Medicine

## 2024-04-01 DIAGNOSIS — L0292 Furuncle, unspecified: Secondary | ICD-10-CM

## 2024-04-05 NOTE — Progress Notes (Signed)
 Remote Loop Recorder Transmission

## 2024-04-12 ENCOUNTER — Ambulatory Visit

## 2024-04-12 DIAGNOSIS — G459 Transient cerebral ischemic attack, unspecified: Secondary | ICD-10-CM

## 2024-04-17 ENCOUNTER — Other Ambulatory Visit: Payer: Self-pay | Admitting: Family Medicine

## 2024-04-17 LAB — CUP PACEART REMOTE DEVICE CHECK
Date Time Interrogation Session: 20251017185900
Implantable Pulse Generator Implant Date: 20241227
Pulse Gen Serial Number: 124096

## 2024-04-19 NOTE — Progress Notes (Signed)
 Remote Loop Recorder Transmission

## 2024-04-20 ENCOUNTER — Encounter: Payer: Self-pay | Admitting: Family Medicine

## 2024-04-20 ENCOUNTER — Other Ambulatory Visit: Payer: Self-pay | Admitting: Family Medicine

## 2024-04-20 MED ORDER — FLUCONAZOLE 150 MG PO TABS: 150.0000 mg | ORAL_TABLET | Freq: Once | ORAL | 0 refills | Status: AC

## 2024-04-20 MED ORDER — DOXYCYCLINE HYCLATE 100 MG PO TABS: 100.0000 mg | ORAL_TABLET | Freq: Two times a day (BID) | ORAL | 0 refills | Status: DC

## 2024-04-21 ENCOUNTER — Ambulatory Visit: Payer: Self-pay | Admitting: Cardiology

## 2024-04-23 ENCOUNTER — Ambulatory Visit: Admitting: Adult Health

## 2024-05-09 ENCOUNTER — Other Ambulatory Visit: Payer: Self-pay | Admitting: *Deleted

## 2024-05-09 DIAGNOSIS — K580 Irritable bowel syndrome with diarrhea: Secondary | ICD-10-CM

## 2024-05-09 MED ORDER — DIPHENOXYLATE-ATROPINE 2.5-0.025 MG PO TABS: 1.0000 | ORAL_TABLET | Freq: Two times a day (BID) | ORAL | 1 refills | Status: AC | PRN

## 2024-05-14 ENCOUNTER — Ambulatory Visit (INDEPENDENT_AMBULATORY_CARE_PROVIDER_SITE_OTHER)

## 2024-05-14 DIAGNOSIS — G459 Transient cerebral ischemic attack, unspecified: Secondary | ICD-10-CM

## 2024-05-14 LAB — CUP PACEART REMOTE DEVICE CHECK
Date Time Interrogation Session: 20251117030300
Implantable Pulse Generator Implant Date: 20241227
Pulse Gen Serial Number: 124096

## 2024-05-16 NOTE — Progress Notes (Signed)
 Remote Loop Recorder Transmission

## 2024-05-28 ENCOUNTER — Ambulatory Visit: Payer: Self-pay | Admitting: Cardiology

## 2024-05-29 ENCOUNTER — Other Ambulatory Visit: Payer: Self-pay | Admitting: Family Medicine

## 2024-05-29 DIAGNOSIS — Z794 Long term (current) use of insulin: Secondary | ICD-10-CM

## 2024-06-05 ENCOUNTER — Other Ambulatory Visit: Payer: Self-pay | Admitting: Family Medicine

## 2024-06-05 DIAGNOSIS — Z8673 Personal history of transient ischemic attack (TIA), and cerebral infarction without residual deficits: Secondary | ICD-10-CM

## 2024-06-06 ENCOUNTER — Encounter: Payer: Self-pay | Admitting: Neurology

## 2024-06-06 ENCOUNTER — Ambulatory Visit: Admitting: Neurology

## 2024-06-06 VITALS — BP 118/76 | HR 110 | Ht 68.0 in | Wt 156.0 lb

## 2024-06-06 DIAGNOSIS — M792 Neuralgia and neuritis, unspecified: Secondary | ICD-10-CM | POA: Diagnosis not present

## 2024-06-06 DIAGNOSIS — B0229 Other postherpetic nervous system involvement: Secondary | ICD-10-CM | POA: Diagnosis not present

## 2024-06-06 DIAGNOSIS — I639 Cerebral infarction, unspecified: Secondary | ICD-10-CM

## 2024-06-06 DIAGNOSIS — G43909 Migraine, unspecified, not intractable, without status migrainosus: Secondary | ICD-10-CM | POA: Diagnosis not present

## 2024-06-06 DIAGNOSIS — R4189 Other symptoms and signs involving cognitive functions and awareness: Secondary | ICD-10-CM

## 2024-06-06 MED ORDER — DULOXETINE HCL 60 MG PO CPEP: 60.0000 mg | ORAL_CAPSULE | Freq: Every day | ORAL | 11 refills | Status: AC

## 2024-06-06 MED ORDER — NURTEC 75 MG PO TBDP: 75.0000 mg | ORAL_TABLET | ORAL | 11 refills | Status: AC | PRN

## 2024-06-06 NOTE — Patient Instructions (Addendum)
 Continue current medications Will request Nurtec as abortive medication (failed both sumatriptan  and rizatriptan ) Will switch fluoxetine  to duloxetine to help with her postherpetic neuralgia and neuropathic pain Will continue to monitor her cognitive complaints, she remains independent Follow-up in 6 months or sooner if worse

## 2024-06-06 NOTE — Progress Notes (Signed)
 [facility]  PATIENT: [name] DOB: 02/18/1961  REQUESTING CLINICIAN: Delbert Clam, MD HISTORY FROM: Patient  REASON FOR VISIT: Headaches/Dizziness/Neuropathic pain    HISTORICAL  CHIEF COMPLAINT:  Chief Complaint  Patient presents with   RM 13    Cerebrovascular accident (CVA);    INTERVAL HISTORY 06/06/2024 Patient presents today for follow-up, last visit was in May since then she tells me she continues to have headaches.  We have initially prescribed her Nurtec but it was not approved as abortive medication.  Since then she has tried both Imitrex  and rizatriptan  and both medications were not controlling her headaches and she did have side effect with the rizatriptan .  She continues on amitriptyline  75 mg daily.  For her first herpetic neuralgia, she tells me that pregabalin  helps somewhat, but she continues to have pain and numbness.  She is on fluoxetine  for depression. Still complaints of cognitive impairment.    INTERVAL HISTORY 11/15/2023 Patient presents today for follow-up, last visit was 6 months ago, she is accompanied by her daughter.  She tells me that since last visit she has episodes of feeling unsteady, fell 1 morning when getting up to use the bathroom, denies tripping and falling., no major injury and did not go to the hospital. In term of the headaches, they are better controlled with amitriptyline , currently she is having 3 headaches a week and Tylenol  helps.    In terms of her postherpetic neuralgia and neuropathy, she tells me that the pregabalin  has not been helpful, she had better relief when she was on brand-name Lyrica  and would like to go back on brand-name Lyrica .  In term of the stroke, she is doing well, her cardiac monitor did not show evidence of atrial fibrillation, she remains on Plavix .   Her main complaint today is forgetfulness, she feels like she is very repetitive, asking the same questions over and over, have  difficulty with her short-term memory.  Her long-term memory is intact.  Her daughter tells me that patient has been losing money, leaving front door open, leaving the freezer door open ruining the food.  She is still independent in all activities of daily living.   INTERVAL HISTORY 04/21/2023:  Patient presents today for follow-up, she is accompanied by her daughter, last visit was in February at that time we started her on amitriptyline  for headache and Lyrica  for post herpetic neuralgia.  She reports on October 15 she presented to the hospital because she woke up with facial numbness and right arm numbness lasting for 30 minutes.  In the ED she was found to have multiple strokes in the left postcentral gyrus, right cerebellum and right parietal cortex.  Stroke etiology likely cardioembolic.  Her EF was 45 to 50% with left ventricle global hypokinesis but she was not found to be in A-fib.  She was recommended a cardiac monitor which has not been completed.  She is pending her follow-up with cardiology.  From the strokes, she denies any deficit as her symptoms only lasted for 30 minutes.  She tells me that her headaches are still present, having 4-5 headaches per day.  Amitriptyline  is not helpful.   Hospital Course and Summary: [name] is a 63 y.o. female with medical history significant of insulin -dependent type 2 diabetes, hypertension, HFpEF, anxiety, depression, GERD, IBS with diarrhea, cervical radiculopathy, left shoulder adhesive capsulitis, neuropathy, COPD presented to the ED  with transient numbness to her whole face and right arm lasting approximately 30  to minutes before resolution. Patient reports having intermittent palpitations over the past few years with an increase in frequency recently each lasting approximately 3 to 4 minutes.  Patient reports having cardiac palpitations prior to onset of presenting symptoms.  Mori significant for acute CVA, admitted for workup, please see  discussion below Acute Ischemic Infarct:  -Small acute infarcts in the right cerebellum, right parietal cortex, and left postcentral gyrus, etiology: Likely cardioembolic in nature  -CT head without contrast 10/16: No acute intracranial abnormality -CTA head & neck unremarkable -MRI small acute infarcts in the right cerebellum, right parietal cortex, and left precentral gyrus.  Remote small vessel infarcts seen along the cerebral cortex and cerebellum, question risk factors for recurrent embolic disease. -2D Echo LVEF 45-50%, left ventricular global hypokinesis. -LDL 79 -HgbA1c 11.4 -With greatly appreciated, recommendation to start Plavix  due to intolerance to aspirin .   -Cardiology input greatly appreciated, she has not had any arrhythmia/afib this admission.  Cardiology favor 30 day monitor then possible ILR as outpatient to check for PAF   INTERVAL HISTORY 08/23/2022:  Patient presents today for follow-up, last visit was in October.  At that time plan was to continue with amitriptyline  25 mg at night and Lyrica  150 mg twice daily.  She reports now that those 2 medications are not helpful.  She still having occasional headache and neuropathic pain.  She also report increased stress related to her sister's death.  She feels that her gait is still abnormal, she sways when she walk, denies any falls.   INTERVAL HISTORY 04/20/22: Patient presents today for follow-up, last visit was in July, at that time we started her on amitriptyline  for headaches preventive medication and also Lyrica  for the neuropathy.  Patient reports the combination of amitriptyline  and Lyrica  is very beneficial, her migraines are well controlled and her neuropathy symptoms are also well controlled with the Lyrica  75/150. She also reports that her dizziness improved.  She does report increased stress, that her sister is in admitted in the ICU and then she is taking care of her and also her niece (sister son) who was diagnosed  with cerebral palsy.   HISTORY OF PRESENT ILLNESS:  This is a 63 year old woman past medical history of COPD, diabetes mellitus, neuropathy, depression, hypertension and hyperlipidemia who is presenting with complaint of dizziness, that she described as room spinning sensation, can last 3 seconds up to a minute.  During this time, she feels really scared, and has nausea.  On top of that she also complained of headaches, reported daily headache, sometimes she can wake up with headaches.  Described headache as aching pain.  In the past, she had tried Amitriptyline  with good results with the headaches.  Patient also complain of neuropathic pain mostly on the left side.  She did have a shingles outbreak on the left side of her back and now in the same region she has itchiness, numbness and stabbing pain.  She reported her neuropathic pain got worse since starting her insulin  and controlling her diabetes.  Currently her A1c went from 13 to almost 7.     OTHER MEDICAL CONDITIONS: COPD PAD, hypertension, hyperlipidemia, diabetes mellitus, depression and neuropathy, shingles   REVIEW OF SYSTEMS: Full 14 system review of systems performed and negative with exception of: as noted in the HPI   ALLERGIES: Allergies  Allergen Reactions   Biaxin [Clarithromycin] Shortness Of Breath and Swelling   Lisinopril Swelling and Cough    Lip edema   Sulfa Antibiotics Anaphylaxis,  Shortness Of Breath, Swelling and Hypertension   Chlorthalidone Nausea And Vomiting and Other (See Comments)    Clammy, Tachycardia, Headache    Daptomycin Nausea And Vomiting   Latex Hives and Rash   Amoxicillin -Pot Clavulanate Diarrhea    Has patient had a PCN reaction causing immediate rash, facial/tongue/throat swelling, SOB or lightheadedness with hypotension:No Has patient had a PCN reaction causing severe rash involving mucus membranes or skin necrosis:No Has patient had a PCN reaction that required hospitalization:No Has  patient had a PCN reaction occurring within THE LAST 10 YEARS.  #  #  #  YES  #  #  #  If all of the above answers are NO, then may proceed with Cephalosporin use.    Aspirin  Nausea And Vomiting and Rash    On 325mg  dosage   Cholestyramine Nausea And Vomiting   Dilaudid  [Hydromorphone  Hcl] Nausea And Vomiting   Lasix [Furosemide] Nausea And Vomiting and Other (See Comments)    Headache     HOME MEDICATIONS: Outpatient Medications Prior to Visit  Medication Sig Dispense Refill   albuterol  (VENTOLIN  HFA) 108 (90 Base) MCG/ACT inhaler INHALE 2 PUFFS INTO THE LUNGS EVERY 4 HOURS AS NEEDED FOR WHEEZING OR SHORTNESS OF BREATH 54 g 8   amitriptyline  (ELAVIL ) 75 MG tablet Take 1 tablet (75 mg total) by mouth at bedtime. 90 tablet 3   cetirizine  (ZYRTEC ) 10 MG tablet Take 1 tablet (10 mg total) by mouth daily as needed for allergies. 90 tablet 3   clopidogrel  (PLAVIX ) 75 MG tablet TAKE 1 TABLET(75 MG) BY MOUTH DAILY 90 tablet 1   conjugated estrogens  (PREMARIN ) vaginal cream Place 1 Applicatorful vaginally 2 (two) times a week. 42.5 g 1   dapagliflozin  propanediol (FARXIGA ) 10 MG TABS tablet Take 1 tablet (10 mg total) by mouth daily before breakfast. 90 tablet 3   dicyclomine  (BENTYL ) 10 MG capsule Take 4 times daily 30 minutes before meals and at bedtime. 120 capsule 5   diphenoxylate -atropine  (LOMOTIL ) 2.5-0.025 MG tablet Take 1 tablet by mouth 2 (two) times daily as needed for diarrhea or loose stools. 60 tablet 1   hydrOXYzine  (ATARAX ) 10 MG tablet TAKE 1 TABLET(10 MG) BY MOUTH THREE TIMES DAILY AS NEEDED 270 tablet 1   Insulin  Aspart FlexPen (NOVOLOG ) 100 UNIT/ML Inject 15 Units into the skin 3 (three) times daily. 45 mL 0   insulin  glargine (LANTUS  SOLOSTAR) 100 UNIT/ML Solostar Pen Inject 40 Units into the skin at bedtime. 15 mL 1   metoprolol  succinate (TOPROL -XL) 25 MG 24 hr tablet Take 1 tablet (25 mg total) by mouth daily. 90 tablet 1   omeprazole  (PRILOSEC) 40 MG capsule Take 1  capsule (40 mg total) by mouth daily. 90 capsule 1   pregabalin  (LYRICA ) 150 MG capsule Take 1 capsule (150 mg total) by mouth 2 (two) times daily. 60 capsule 5   rosuvastatin  (CRESTOR ) 20 MG tablet Take 1 tablet (20 mg total) by mouth daily. 90 tablet 1   SPIRIVA  RESPIMAT 1.25 MCG/ACT AERS INHALE 2 PUFFS INTO THE LUNGS DAILY 4 g 5   spironolactone  (ALDACTONE ) 25 MG tablet Take 0.5 tablets (12.5 mg total) by mouth daily. 45 tablet 1   FLUoxetine  (PROZAC ) 40 MG capsule Take 1 capsule (40 mg total) by mouth daily. 90 capsule 1   Blood Glucose Monitoring Suppl (ACCU-CHEK GUIDE ME) w/Device KIT Use to check blood sugar TID. 1 kit 0   clobetasol  ointment (TEMOVATE ) 0.05 % Apply 1 Application topically 2 (two) times daily.  For 14 days 30 g 0   doxycycline  (VIBRA -TABS) 100 MG tablet Take 1 tablet (100 mg total) by mouth 2 (two) times daily. 20 tablet 0   meclizine  (ANTIVERT ) 25 MG tablet TAKE 1 TABLET(25 MG) BY MOUTH TWICE DAILY 30 tablet 2   mupirocin  ointment (BACTROBAN ) 2 % Apply 1 Application topically 2 (two) times daily. (Patient not taking: Reported on 03/12/2024) 22 g 1   promethazine  (PHENERGAN ) 25 MG tablet 1/2-1 tab every 4 to 6 hours prn nausea 20 tablet 0   rizatriptan  (MAXALT -MLT) 10 MG disintegrating tablet Take 1 tablet (10 mg total) by mouth as needed for migraine. May repeat in 2 hours if needed (Patient not taking: Reported on 03/12/2024) 9 tablet 11   traZODone  (DESYREL ) 50 MG tablet Take 1 tablet (50 mg total) by mouth at bedtime as needed for sleep. 90 tablet 1   vancomycin  (VANCOCIN ) 125 MG capsule Take 125 mg by mouth 4 (four) times daily. (Patient not taking: Reported on 03/12/2024)     No facility-administered medications prior to visit.    PAST MEDICAL HISTORY: Past Medical History:  Diagnosis Date   Anxiety    Arthritis    Chronic systolic heart failure (HCC)    EF 20-25% ECHO 05/2015   COPD (chronic obstructive pulmonary disease) (HCC)    Depression    Diabetes mellitus  without complication (HCC)    Type II   Dysrhythmia    pt unsure of name of arrythmia -  my heart rate will drop all of a sudden -no current treatment - atrial flutter   Fibrillation, atrial (HCC)    Gallstones    GERD (gastroesophageal reflux disease)    Headache(784.0)    migraines   Hypertension    Osteomyelitis (HCC)    R ankle   Rash    arms   Stroke [facility])     PAST SURGICAL HISTORY: Past Surgical History:  Procedure Laterality Date   ANKLE FRACTURE SURGERY Right 2015   CARDIAC CATHETERIZATION N/A 06/24/2015   Procedure: Left Heart Cath and Coronary Angiography;  Surgeon: Victory LELON Sharps, MD;  Location: [facility] INVASIVE CV LAB;  Service: Cardiovascular;  Laterality: N/A;   CARPAL TUNNEL RELEASE     bil   CHOLECYSTECTOMY  11/24/2011   Procedure: LAPAROSCOPIC CHOLECYSTECTOMY WITH INTRAOPERATIVE CHOLANGIOGRAM;  Surgeon: Debby LABOR. Cornett, MD;  Location: [facility] ORS;  Service: General;  Laterality: N/A;  Laparoscopic Cholecystectomy with Cholangiogram   COLONOSCOPY     ECTOPIC PREGNANCY SURGERY  many yrs ago   HARDWARE REMOVAL Right 07/15/2015   Procedure: RIGHT ANKLE HARDWARE REMOVAL, PLACEMENT OF STIMULAN ANTIBIOTIC BEADS AND PREVENA WOUND VAC.;  Surgeon: Glendia Cordella Hutchinson, MD;  Location: [facility] OR;  Service: Orthopedics;  Laterality: Right;   TOOTH EXTRACTION N/A 12/03/2016   Procedure: DENTAL EXTRACTIONS with Alveoloplasty;  Surgeon: Sheryle Glendia, DDS;  Location: [facility] OR;  Service: Oral Surgery;  Laterality: N/A;    FAMILY HISTORY: Family History  Problem Relation Age of Onset   Diabetes Mother    Lung cancer Father    Heart attack Father 93       CABG x4   Breast cancer Sister        Survivor     SOCIAL HISTORY: Social History   Socioeconomic History   Marital status: Divorced    Spouse name: Not on file   Number of children: Not on file   Years of education: Not on file   Highest education level: Not on file  Occupational History  Not on file  Tobacco Use   Smoking  status: Former    Current packs/day: 0.00    Average packs/day: 1 pack/day for 56.0 years (56.0 ttl pk-yrs)    Types: Cigarettes, E-cigarettes    Start date: 09/08/1981    Quit date: 12/15/2023    Years since quitting: 0.4    Passive exposure: Past   Smokeless tobacco: Never   Tobacco comments:    Quit smoking 6/3- updated 12/15/2023, VAPES--no nicotine   Vaping Use   Vaping status: Every Day   Start date: 10/12/2016  Substance and Sexual Activity   Alcohol use: No    Alcohol/week: 0.0 standard drinks of alcohol   Drug use: No   Sexual activity: Not Currently    Partners: Male    Birth control/protection: Post-menopausal    Comment: menarche 63yo, sexual debut 63yo  Other Topics Concern   Not on file  Social History Narrative   ** Merged History Encounter **    Right handed   Caffeine- 1 cup daily   Social Drivers of Health   Financial Resource Strain: Medium Risk (04/25/2023)   Overall Financial Resource Strain (CARDIA)    Difficulty of Paying Living Expenses: Somewhat hard  Food Insecurity: Food Insecurity Present (04/25/2023)   Hunger Vital Sign    Worried About Running Out of Food in the Last Year: Sometimes true    Ran Out of Food in the Last Year: Sometimes true  Transportation Needs: No Transportation Needs (04/14/2023)   PRAPARE - Administrator, Civil Service (Medical): No    Lack of Transportation (Non-Medical): No  Physical Activity: Insufficiently Active (04/25/2023)   Exercise Vital Sign    Days of Exercise per Week: 3 days    Minutes of Exercise per Session: 10 min  Stress: No Stress Concern Present (04/25/2023)   [employer] of Occupational Health - Occupational Stress Questionnaire    Feeling of Stress : Not at all  Social Connections: Socially Isolated (04/25/2023)   Social Connection and Isolation Panel    Frequency of Communication with Friends and Family: More than three times a week    Frequency of Social Gatherings with Friends and  Family: More than three times a week    Attends Religious Services: Never    Database Administrator or Organizations: No    Attends Banker Meetings: Never    Marital Status: Divorced  Catering Manager Violence: Not At Risk (04/25/2023)   Humiliation, Afraid, Rape, and Kick questionnaire    Fear of Current or Ex-Partner: No    Emotionally Abused: No    Physically Abused: No    Sexually Abused: No    PHYSICAL EXAM  GENERAL EXAM/CONSTITUTIONAL: Vitals:  Vitals:   06/06/24 1440  BP: 118/76  Pulse: (!) 110  Weight: 156 lb (70.8 kg)  Height: 5' 8 (1.727 m)   Body mass index is 23.72 kg/m. Wt Readings from Last 3 Encounters:  06/06/24 156 lb (70.8 kg)  03/12/24 150 lb 14.4 oz (68.4 kg)  02/25/24 150 lb (68 kg)   Patient is in no distress; well developed, nourished and groomed; neck is supple  MUSCULOSKELETAL: Gait, strength, tone, movements noted in Neurologic exam below  NEUROLOGIC: MENTAL STATUS:      No data to display              06/06/2024    2:43 PM 11/15/2023    1:49 PM  Montreal Cognitive Assessment   Visuospatial/ Executive (0/5) 5 5  Naming (0/3) 2 3  Attention: Read list of digits (0/2) 2 2  Attention: Read list of letters (0/1) 1 1  Attention: Serial 7 subtraction starting at 100 (0/3) 3 2  Language: Repeat phrase (0/2) 0 1  Language : Fluency (0/1) 0 0  Abstraction (0/2) 1 2  Delayed Recall (0/5) 2 2  Orientation (0/6) 6 6  Total 22 24    awake, alert, oriented to person, place and time language fluent, comprehension intact, naming intact fund of knowledge appropriate  CRANIAL NERVE:  2nd, 3rd, 4th, 6th - visual fields full to confrontation, extraocular muscles intact, no nystagmus 5th - facial sensation symmetric 7th - facial strength symmetric 8th - hearing intact 9th - palate elevates symmetrically, uvula midline 11th - shoulder shrug symmetric 12th - tongue protrusion midline  MOTOR:  normal bulk and tone, full  strength in the BUE, BLE  SENSORY:  normal and symmetric to light touch  COORDINATION:  finger-nose-finger, fine finger movements normal  GAIT/STATION:  Slow wide based, unable to tandem, mild romberg   DIAGNOSTIC DATA (LABS, IMAGING, TESTING) - I reviewed patient records, labs, notes, testing and imaging myself where available.  Lab Results  Component Value Date   WBC 10.7 (H) 02/25/2024   HGB 15.0 02/25/2024   HCT 44.0 02/25/2024   MCV 86.3 02/25/2024   PLT 154 02/25/2024      Component Value Date/Time   NA 140 02/25/2024 1234   NA 142 09/15/2023 1554   K 3.7 02/25/2024 1234   CL 106 02/25/2024 1234   CO2 22 02/25/2024 1220   GLUCOSE 168 (H) 02/25/2024 1234   BUN 11 02/25/2024 1234   BUN 9 09/15/2023 1554   CREATININE 0.60 02/25/2024 1234   CREATININE 0.59 07/05/2016 0947   CALCIUM  8.8 (L) 02/25/2024 1220   PROT 6.8 02/25/2024 1220   PROT 6.5 09/15/2023 1554   ALBUMIN 3.7 02/25/2024 1220   ALBUMIN 3.9 09/15/2023 1554   AST 31 02/25/2024 1220   ALT 35 02/25/2024 1220   ALKPHOS 92 02/25/2024 1220   BILITOT 0.4 02/25/2024 1220   BILITOT <0.2 09/15/2023 1554   GFRNONAA >60 02/25/2024 1220   GFRNONAA >89 07/05/2016 0947   GFRAA 114 07/11/2020 1002   GFRAA >89 07/05/2016 0947   Lab Results  Component Value Date   CHOL 107 01/26/2024   HDL 34 (L) 01/26/2024   LDLCALC 50 01/26/2024   TRIG 129 01/26/2024   CHOLHDL 2.7 04/14/2023   Lab Results  Component Value Date   HGBA1C 10.6 (A) 01/26/2024   No results found for: VITAMINB12 Lab Results  Component Value Date   TSH 0.997 03/09/2021    MRI Brain 01/31/22 Scattered T2/FLAIR hyperintense foci in the hemispheres and cerebellum consistent with mild chronic microvascular ischemic changes or remote ischemic events.  These are stable compared to the 2016 MRI. Bilateral mastoid effusions.  These are usually caused by eustachian tube dysfunction. No acute findings   MRI Brain 04/13/2023 Small acute infarcts  in the right cerebellum, right parietal cortex, and left postcentral gyrus. 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  CTA Head and Neck 04/13/2023 1. No acute intracranial abnormality. The acute infarcts seen on same day brain MRI are below the threshold of CT. 2. No large vessel occlusion, hemodynamically significant stenosis, dissection, or aneurysm in the head or neck.   CT Temporal 03/05/2022 Mild mastoid opacification with negative nasopharynx, seen to some degree on scans over  multiple years.   ASSESSMENT AND PLAN  63 y.o. year old female with medical conditions including hypertension, hyperlipidemia, diabetes mellitus, COPD and postherpetic neuralgia who is presenting for follow up for her headaches, neuropathy and post herpetic neuralgia.   In term of her headaches, we will continue amitriptyline  75 mg nightly as preventative.  Both sumatriptan  and rizatriptan  was not helpful when it comes to her migraine she did have side effect with the rizatriptan .  Will request Nurtec again.   For her postherpetic neuralgia, we will continue Lyrica  but will switch fluoxetine  to duloxetine.  Advised patient to contact me for updates.   In term of the strokes, continue patient Plavix  and Crestor .  For cognitive complaint, she remains independent in her ADLs, we will continue to monitor her symptoms.  Follow-up in 6 months or sooner if worse   1. Cerebrovascular accident (CVA), unspecified mechanism (HCC)   2. Postherpetic neuralgia   3. Neuropathic pain   4. Episodic migraine   5. Cognitive impairment      Patient Instructions  Continue current medications Will request Nurtec as abortive medication (failed both sumatriptan  and rizatriptan ) Will switch fluoxetine  to duloxetine to help with her postherpetic neuralgia and neuropathic pain Will continue to monitor her cognitive complaints, she remains  independent Follow-up in 6 months or sooner if worse  No orders of the defined types were placed in this encounter.   Meds ordered this encounter  Medications   DULoxetine (CYMBALTA) 60 MG capsule    Sig: Take 1 capsule (60 mg total) by mouth daily.    Dispense:  30 capsule    Refill:  11   Rimegepant Sulfate (NURTEC) 75 MG TBDP    Sig: Take 1 tablet (75 mg total) by mouth as needed.    Dispense:  8 tablet    Refill:  11    Return in about 6 months (around 12/05/2024).   Pastor Falling, MD 06/06/2024, 3:48 PM  [facility] [address] [place], [place] [address] [phone]

## 2024-06-07 ENCOUNTER — Telehealth: Payer: Self-pay | Admitting: Pharmacist

## 2024-06-07 ENCOUNTER — Ambulatory Visit: Admitting: Adult Health

## 2024-06-07 NOTE — Telephone Encounter (Signed)
 Pharmacy Patient Advocate Encounter   Received notification from Patient Pharmacy that prior authorization for Nurtec 75MG  dispersible tablets is required/requested.   Insurance verification completed.   The patient is insured through [facility].   Per test claim: PA required; PA submitted to above mentioned insurance via Latent Key/confirmation #/EOC [id number] Status is pending

## 2024-06-08 NOTE — Telephone Encounter (Signed)
 Pharmacy Patient Advocate Encounter  Received notification from [facility] that Prior Authorization for Rimegepant Sulfate (NURTEC) 75 MG TBDP has been APPROVED from 06/07/2024 to 06/07/2025   PA #/Case ID/Reference #: [id number]

## 2024-06-14 ENCOUNTER — Encounter

## 2024-06-14 DIAGNOSIS — G459 Transient cerebral ischemic attack, unspecified: Secondary | ICD-10-CM | POA: Diagnosis not present

## 2024-06-15 ENCOUNTER — Ambulatory Visit: Payer: Self-pay | Admitting: Cardiology

## 2024-06-15 LAB — CUP PACEART REMOTE DEVICE CHECK
Date Time Interrogation Session: 20251218210500
Implantable Pulse Generator Implant Date: 20241227
Pulse Gen Serial Number: 124096

## 2024-06-18 NOTE — Progress Notes (Signed)
 Remote Loop Recorder Transmission

## 2024-06-22 ENCOUNTER — Other Ambulatory Visit: Payer: Self-pay | Admitting: Family Medicine

## 2024-06-22 DIAGNOSIS — E1165 Type 2 diabetes mellitus with hyperglycemia: Secondary | ICD-10-CM

## 2024-06-29 ENCOUNTER — Other Ambulatory Visit: Payer: Self-pay | Admitting: Family Medicine

## 2024-06-29 ENCOUNTER — Encounter: Payer: Self-pay | Admitting: Family Medicine

## 2024-06-29 MED ORDER — FLUCONAZOLE 150 MG PO TABS: 150.0000 mg | ORAL_TABLET | Freq: Once | ORAL | 0 refills | Status: AC

## 2024-06-29 MED ORDER — DOXYCYCLINE HYCLATE 100 MG PO TABS: 100.0000 mg | ORAL_TABLET | Freq: Two times a day (BID) | ORAL | 0 refills | Status: AC

## 2024-07-04 ENCOUNTER — Encounter: Payer: Self-pay | Admitting: Emergency Medicine

## 2024-07-04 ENCOUNTER — Ambulatory Visit: Admitting: Emergency Medicine

## 2024-07-04 VITALS — BP 132/76 | HR 92 | Ht 67.0 in | Wt 149.0 lb

## 2024-07-04 DIAGNOSIS — R911 Solitary pulmonary nodule: Secondary | ICD-10-CM

## 2024-07-04 DIAGNOSIS — Z23 Encounter for immunization: Secondary | ICD-10-CM | POA: Diagnosis not present

## 2024-07-04 DIAGNOSIS — J449 Chronic obstructive pulmonary disease, unspecified: Secondary | ICD-10-CM

## 2024-07-04 DIAGNOSIS — J069 Acute upper respiratory infection, unspecified: Secondary | ICD-10-CM

## 2024-07-04 DIAGNOSIS — R059 Cough, unspecified: Secondary | ICD-10-CM

## 2024-07-04 DIAGNOSIS — J410 Simple chronic bronchitis: Secondary | ICD-10-CM

## 2024-07-04 DIAGNOSIS — R053 Chronic cough: Secondary | ICD-10-CM

## 2024-07-04 DIAGNOSIS — F17290 Nicotine dependence, other tobacco product, uncomplicated: Secondary | ICD-10-CM

## 2024-07-04 DIAGNOSIS — Z720 Tobacco use: Secondary | ICD-10-CM

## 2024-07-04 MED ORDER — ALBUTEROL SULFATE HFA 108 (90 BASE) MCG/ACT IN AERS: 2.0000 | INHALATION_SPRAY | RESPIRATORY_TRACT | 8 refills | Status: AC | PRN

## 2024-07-04 MED ORDER — SPIRIVA RESPIMAT 1.25 MCG/ACT IN AERS: 2.0000 | INHALATION_SPRAY | Freq: Every day | RESPIRATORY_TRACT | 5 refills | Status: AC

## 2024-07-04 NOTE — Assessment & Plan Note (Signed)
 She had a flare in September, improved.  Stable currently with good functional capacity.  She needs a flu shot and will give it today.  Plan continue same regimen.  Discussed smoking cessation  Please continue your Spiriva  2 puffs once daily as you have been taking. Keep your albuterol  available to use 2 puffs when needed for shortness of breath, chest tightness, wheezing. We will give you the flu shot today. Please follow with Dr. Shelah in 1 year.  Call sooner if you have any problems.

## 2024-07-04 NOTE — Progress Notes (Signed)
 "  Subjective:    Patient ID: [name], female    DOB: 03-Dec-1960, 64 y.o.   MRN: [id number]  COPD Her past medical history is significant for COPD.    ROV 07/04/2024 --follow-up visit for 64 year old woman with a history of tobacco and vape use, associated COPD/asthma.  She also has had a history of right middle lobe mucoid impaction on prior scanning that resolved, history of CVA, allergic rhinitis, cough. She was seen here in September with an acute exacerbation. She has been dealing with cough, more active since 2-3 weeks ago. Usually dry. She is off of zyrtec . She has been on her omeprazole , but had a gap in therapy - she believes that her cough became more active at the time.  Currently managed on Spiriva . She feels that she is breathing well. She will get some dyspnea with heavier activity - running with her grandchildren. Has not needed any albuterol . She quit vaping, down to 6 cig a day.   Lung cancer screening CT scan of the chest 02/14/2024 reviewed by me, showed no mediastinal or hilar adenopathy, some mild paraseptal emphysema and bronchial wall thickening.  There is a posterior left lower lobe area of bandlike atelectasis.  Stable 3.8 mm anterior right lower lobe nodule.  No new nodules.  RADS 2 study   Review of Systems As per HPI  Past Medical History:  Diagnosis Date   Anxiety    Arthritis    Chronic systolic heart failure (HCC)    EF 20-25% ECHO 05/2015   COPD (chronic obstructive pulmonary disease) (HCC)    Depression    Diabetes mellitus without complication (HCC)    Type II   Dysrhythmia    pt unsure of name of arrythmia -  my heart rate will drop all of a sudden -no current treatment - atrial flutter   Fibrillation, atrial (HCC)    Gallstones    GERD (gastroesophageal reflux disease)    Headache(784.0)    migraines   Hypertension    Osteomyelitis (HCC)    R ankle   Rash    arms   Stroke (HCC)      Family History  Problem Relation Age of  Onset   Diabetes Mother    Lung cancer Father    Heart attack Father 43       CABG x4   Breast cancer Sister        Survivor     Son >> sarcoidosis  Social History   Socioeconomic History   Marital status: Divorced    Spouse name: Not on file   Number of children: Not on file   Years of education: Not on file   Highest education level: Not on file  Occupational History   Not on file  Tobacco Use   Smoking status: Every Day    Current packs/day: 0.00    Average packs/day: 1 pack/day for 56.0 years (56.0 ttl pk-yrs)    Types: Cigarettes, E-cigarettes    Start date: 09/08/1981    Last attempt to quit: 12/15/2023    Years since quitting: 0.5    Passive exposure: Past   Smokeless tobacco: Never   Tobacco comments:    6 cigs per day 07/04/2024  Vaping Use   Vaping status: Every Day   Start date: 10/12/2016  Substance and Sexual Activity   Alcohol use: No    Alcohol/week: 0.0 standard drinks of alcohol   Drug use: No   Sexual activity: Not Currently  Partners: Male    Birth control/protection: Post-menopausal    Comment: menarche 64yo, sexual debut 64yo  Other Topics Concern   Not on file  Social History Narrative   ** Merged History Encounter **    Right handed   Caffeine- 1 cup daily   Social Drivers of Health   Tobacco Use: High Risk (07/04/2024)   Patient History    Smoking Tobacco Use: Every Day    Smokeless Tobacco Use: Never    Passive Exposure: Past  Financial Resource Strain: Medium Risk (04/25/2023)   Overall Financial Resource Strain (CARDIA)    Difficulty of Paying Living Expenses: Somewhat hard  Food Insecurity: Food Insecurity Present (04/25/2023)   Hunger Vital Sign    Worried About Running Out of Food in the Last Year: Sometimes true    Ran Out of Food in the Last Year: Sometimes true  Transportation Needs: No Transportation Needs (04/14/2023)   PRAPARE - Administrator, Civil Service (Medical): No    Lack of Transportation  (Non-Medical): No  Physical Activity: Insufficiently Active (04/25/2023)   Exercise Vital Sign    Days of Exercise per Week: 3 days    Minutes of Exercise per Session: 10 min  Stress: No Stress Concern Present (04/25/2023)   [employer] of Occupational Health - Occupational Stress Questionnaire    Feeling of Stress : Not at all  Social Connections: Socially Isolated (04/25/2023)   Social Connection and Isolation Panel    Frequency of Communication with Friends and Family: More than three times a week    Frequency of Social Gatherings with Friends and Family: More than three times a week    Attends Religious Services: Never    Database Administrator or Organizations: No    Attends Banker Meetings: Never    Marital Status: Divorced  Catering Manager Violence: Not At Risk (04/25/2023)   Humiliation, Afraid, Rape, and Kick questionnaire    Fear of Current or Ex-Partner: No    Emotionally Abused: No    Physically Abused: No    Sexually Abused: No  Depression (PHQ2-9): Medium Risk (09/15/2023)   Depression (PHQ2-9)    PHQ-2 Score: 7  Alcohol Screen: Low Risk (04/25/2023)   Alcohol Screen    Last Alcohol Screening Score (AUDIT): 0  Housing: Medium Risk (04/25/2023)   Housing    Last Housing Risk Score: 1  Utilities: Not At Risk (04/25/2023)   AHC Utilities    Threatened with loss of utilities: No  Health Literacy: Inadequate Health Literacy (04/25/2023)   B1300 Health Literacy    Frequency of need for help with medical instructions: Sometimes     Allergies  Allergen Reactions   Biaxin [Clarithromycin] Shortness Of Breath and Swelling   Lisinopril Swelling and Cough    Lip edema   Sulfa Antibiotics Anaphylaxis, Shortness Of Breath, Swelling and Hypertension   Chlorthalidone Nausea And Vomiting and Other (See Comments)    Clammy, Tachycardia, Headache    Daptomycin Nausea And Vomiting   Latex Hives and Rash   Amoxicillin -Pot Clavulanate Diarrhea    Has  patient had a PCN reaction causing immediate rash, facial/tongue/throat swelling, SOB or lightheadedness with hypotension:No Has patient had a PCN reaction causing severe rash involving mucus membranes or skin necrosis:No Has patient had a PCN reaction that required hospitalization:No Has patient had a PCN reaction occurring within THE LAST 10 YEARS.  #  #  #  YES  #  #  #  If all of  the above answers are NO, then may proceed with Cephalosporin use.    Aspirin  Nausea And Vomiting and Rash    On 325mg  dosage   Cholestyramine Nausea And Vomiting   Dilaudid  [Hydromorphone  Hcl] Nausea And Vomiting   Lasix [Furosemide] Nausea And Vomiting and Other (See Comments)    Headache      Outpatient Medications Prior to Visit  Medication Sig Dispense Refill   albuterol  (VENTOLIN  HFA) 108 (90 Base) MCG/ACT inhaler INHALE 2 PUFFS INTO THE LUNGS EVERY 4 HOURS AS NEEDED FOR WHEEZING OR SHORTNESS OF BREATH 54 g 8   amitriptyline  (ELAVIL ) 75 MG tablet Take 1 tablet (75 mg total) by mouth at bedtime. 90 tablet 3   cetirizine  (ZYRTEC ) 10 MG tablet Take 1 tablet (10 mg total) by mouth daily as needed for allergies. 90 tablet 3   clopidogrel  (PLAVIX ) 75 MG tablet TAKE 1 TABLET(75 MG) BY MOUTH DAILY 90 tablet 1   conjugated estrogens  (PREMARIN ) vaginal cream Place 1 Applicatorful vaginally 2 (two) times a week. 42.5 g 1   dapagliflozin  propanediol (FARXIGA ) 10 MG TABS tablet Take 1 tablet (10 mg total) by mouth daily before breakfast. 90 tablet 3   diphenoxylate -atropine  (LOMOTIL ) 2.5-0.025 MG tablet Take 1 tablet by mouth 2 (two) times daily as needed for diarrhea or loose stools. 60 tablet 1   doxycycline  (VIBRA -TABS) 100 MG tablet Take 1 tablet (100 mg total) by mouth 2 (two) times daily. 20 tablet 0   hydrOXYzine  (ATARAX ) 10 MG tablet TAKE 1 TABLET(10 MG) BY MOUTH THREE TIMES DAILY AS NEEDED 270 tablet 1   Insulin  Aspart FlexPen (NOVOLOG ) 100 UNIT/ML Inject 15 Units into the skin 3 (three) times daily. 45 mL 0    insulin  glargine (LANTUS  SOLOSTAR) 100 UNIT/ML Solostar Pen Inject 40 Units into the skin daily. 15 mL 0   metoprolol  succinate (TOPROL -XL) 25 MG 24 hr tablet Take 1 tablet (25 mg total) by mouth daily. 90 tablet 1   omeprazole  (PRILOSEC) 40 MG capsule Take 1 capsule (40 mg total) by mouth daily. 90 capsule 1   pregabalin  (LYRICA ) 150 MG capsule Take 1 capsule (150 mg total) by mouth 2 (two) times daily. 60 capsule 5   Rimegepant Sulfate (NURTEC) 75 MG TBDP Take 1 tablet (75 mg total) by mouth as needed. 8 tablet 11   rosuvastatin  (CRESTOR ) 20 MG tablet Take 1 tablet (20 mg total) by mouth daily. 90 tablet 1   SPIRIVA  RESPIMAT 1.25 MCG/ACT AERS INHALE 2 PUFFS INTO THE LUNGS DAILY 4 g 5   dicyclomine  (BENTYL ) 10 MG capsule Take 4 times daily 30 minutes before meals and at bedtime. (Patient not taking: Reported on 07/04/2024) 120 capsule 5   DULoxetine  (CYMBALTA ) 60 MG capsule Take 1 capsule (60 mg total) by mouth daily. (Patient not taking: Reported on 07/04/2024) 30 capsule 11   spironolactone  (ALDACTONE ) 25 MG tablet Take 0.5 tablets (12.5 mg total) by mouth daily. (Patient not taking: Reported on 07/04/2024) 45 tablet 1   No facility-administered medications prior to visit.        Objective:   Physical Exam  Vitals:   07/04/24 0942  BP: 132/76  Pulse: 92  SpO2: 96%  Weight: 149 lb (67.6 kg)  Height: 5' 7 (1.702 m)    Gen: Pleasant, well-nourished, in no distress,  normal affect  ENT: No lesions,  mouth clear,  oropharynx clear, no postnasal drip  Neck: No JVD, no stridor  Lungs: No use of accessory muscles, clear bilaterally  Cardiovascular: RRR,  heart sounds normal, no murmur or gallops, no peripheral edema  Musculoskeletal: No deformities, no cyanosis or clubbing  Neuro: alert, awake, non focal  Skin: Warm, scattered rash on back, face, arms with some evidence of excoriation      Assessment & Plan:  COPD (chronic obstructive pulmonary disease) (HCC) She had a flare  in September, improved.  Stable currently with good functional capacity.  She needs a flu shot and will give it today.  Plan continue same regimen.  Discussed smoking cessation  Please continue your Spiriva  2 puffs once daily as you have been taking. Keep your albuterol  available to use 2 puffs when needed for shortness of breath, chest tightness, wheezing. We will give you the flu shot today. Please follow with Dr. Shelah in 1 year.  Call sooner if you have any problems.  Tobacco abuse Discussed cessation with her today.  She has cut down significantly.  No longer vaping  Cough Continue your omeprazole  as you have been taking it. If your cough becomes more bothersome you might want to consider getting back on your Zyrtec . Congratulations on decreasing your smoking.  Ultimate goal will be to stop altogether.  Pulmonary nodule We reviewed your CT scan of the chest.  This was stable.  You need to repeat lung cancer screening CT chest in August 2025       Lamar Shelah, MD, PhD 07/04/2024, 10:08 AM  Pulmonary and Critical Care [phone] or if no answer before 7:00PM call [phone] For any issues after 7:00PM please call eLink [phone]   "

## 2024-07-04 NOTE — Assessment & Plan Note (Signed)
 Continue your omeprazole  as you have been taking it. If your cough becomes more bothersome you might want to consider getting back on your Zyrtec . Congratulations on decreasing your smoking.  Ultimate goal will be to stop altogether.

## 2024-07-04 NOTE — Assessment & Plan Note (Signed)
 Discussed cessation with her today.  She has cut down significantly.  No longer vaping

## 2024-07-04 NOTE — Assessment & Plan Note (Signed)
 We reviewed your CT scan of the chest.  This was stable.  You need to repeat lung cancer screening CT chest in August 2025

## 2024-07-04 NOTE — Patient Instructions (Signed)
 Please continue your Spiriva  2 puffs once daily as you have been taking. Keep your albuterol  available to use 2 puffs when needed for shortness of breath, chest tightness, wheezing. We will give you the flu shot today. We reviewed your CT scan of the chest.  This was stable.  You need to repeat lung cancer screening CT chest in August 2025 Continue your omeprazole  as you have been taking it. If your cough becomes more bothersome you might want to consider getting back on your Zyrtec . Congratulations on decreasing your smoking.  Ultimate goal will be to stop altogether. Please follow with Dr. Shelah in 1 year.  Call sooner if you have any problems.

## 2024-07-11 ENCOUNTER — Other Ambulatory Visit: Payer: Self-pay | Admitting: Cardiology

## 2024-07-12 NOTE — Telephone Encounter (Signed)
 In accordance with refill protocols, please review and address the following requirements before this medication refill can be authorized:  Labs

## 2024-07-12 NOTE — Telephone Encounter (Signed)
 Pt scheduled with Dr. Kriste on 07/20/24.

## 2024-07-15 ENCOUNTER — Ambulatory Visit

## 2024-07-15 DIAGNOSIS — G459 Transient cerebral ischemic attack, unspecified: Secondary | ICD-10-CM | POA: Diagnosis not present

## 2024-07-16 LAB — CUP PACEART REMOTE DEVICE CHECK
Date Time Interrogation Session: 20260118003100
Implantable Pulse Generator Implant Date: 20241227
Pulse Gen Serial Number: 124096

## 2024-07-17 NOTE — Progress Notes (Signed)
 Remote Loop Recorder Transmission

## 2024-07-18 ENCOUNTER — Encounter: Payer: Self-pay | Admitting: *Deleted

## 2024-07-20 ENCOUNTER — Ambulatory Visit: Attending: Internal Medicine | Admitting: Internal Medicine

## 2024-07-20 ENCOUNTER — Encounter: Payer: Self-pay | Admitting: Internal Medicine

## 2024-07-20 VITALS — BP 122/72 | HR 84 | Ht 67.0 in | Wt 159.7 lb

## 2024-07-20 DIAGNOSIS — I428 Other cardiomyopathies: Secondary | ICD-10-CM | POA: Diagnosis present

## 2024-07-20 DIAGNOSIS — I5043 Acute on chronic combined systolic (congestive) and diastolic (congestive) heart failure: Secondary | ICD-10-CM | POA: Insufficient documentation

## 2024-07-20 DIAGNOSIS — I639 Cerebral infarction, unspecified: Secondary | ICD-10-CM | POA: Diagnosis present

## 2024-07-20 DIAGNOSIS — E119 Type 2 diabetes mellitus without complications: Secondary | ICD-10-CM | POA: Diagnosis present

## 2024-07-20 DIAGNOSIS — E1142 Type 2 diabetes mellitus with diabetic polyneuropathy: Secondary | ICD-10-CM | POA: Insufficient documentation

## 2024-07-20 DIAGNOSIS — Z794 Long term (current) use of insulin: Secondary | ICD-10-CM | POA: Diagnosis present

## 2024-07-20 MED ORDER — FUROSEMIDE 20 MG PO TABS: 20.0000 mg | ORAL_TABLET | ORAL | 0 refills | Status: AC | PRN

## 2024-07-20 NOTE — Progress Notes (Signed)
 " Cardiology Office Note:  .   Date:  07/20/2024  ID:  Camie Kizzie Shed, DOB 1961-04-29, MRN [id number] PCP: Delbert Clam, MD  [facility] Providers Cardiologist:  Aleene Passe, MD (Inactive) Electrophysiologist:  Fonda Kitty, MD    History of Present Illness: .     Discussed the use of AI scribe software for clinical note transcription with the patient, who gave verbal consent to proceed.  History of Present Illness Jalaina Salyers is a 64 year old female with heart failure and prior strokes who presents for follow-up for heart failure management.  Neurological symptoms and cerebrovascular disease - Right facial and arm numbness occurred on April 12, 2023, consistent with small acute infarcts in the right cerebellum, right parietal cortex, and left postcentral gyrus.  There was concern for cardioembolic etiology but TEE was not performed - Persistent memory impairment and off-balance sensations since the strokes, described as gradually worsening. - Currently taking medication prescribed by neurology for memory issues. - No further strokes since initial events.  Cardiac arrhythmia and monitoring - History of atrial fibrillation. - Loop recorder implanted on June 24, 2023; device remains in place. - Remote loop recorder interrogation on July 15, 2024, showed no atrial fibrillation, tachy-brady, or pause episodes. - Occasional lightheadedness at rest. - Intermittent episodes of fast or slow heart rate, infrequent. - No recent palpitations.  Heart failure and cardiac function - History of heart failure with ejection fraction (EF) 25-30% in 2016, improved to 45-50% by April 13, 2023. - Currently taking Farxiga  10 mg, metoprolol  succinate 25 mg, spironolactone  25 mg; previously on bisoprolol  and valsartan . - No recent chest pain. - No shortness of breath when lying down at night. - Swelling in the lower extremity began less than two weeks ago. - Feet  previously described as bony; now one foot appears larger than the other.          ROS: Remaining review of systems negative  Studies Reviewed: .        Results Radiology Brain MRI (04/12/2023): Small acute infarcts in right cerebellum, right parietal cortex, and left postcentral gyrus  Diagnostic Transthoracic echocardiogram (04/13/2023): Left ventricular ejection fraction 45-50%, septal motion consistent with left bundle branch block, global hypokinesis, mildly reduced right ventricular systolic function, trivial aortic regurgitation Transthoracic echocardiogram (2016): Left ventricular ejection fraction 25-30%, global hypokinesis, grade 1 diastolic dysfunction Loop recorder interrogation (07/15/2024): No atrial fibrillation, tachycardia, bradycardia, or pause episodes; one symptomatic episode with lightheadedness at rest consistent with normal sinus rhythm Risk Assessment/Calculations:             Physical Exam:   VS:  BP 122/72   Pulse 84   Ht 5' 7 (1.702 m)   Wt 159 lb 11.2 oz (72.4 kg)   LMP 06/09/2011 Comment: verified BEFORE imaging  SpO2 94%   BMI 25.01 kg/m    Wt Readings from Last 3 Encounters:  07/20/24 159 lb 11.2 oz (72.4 kg)  07/04/24 149 lb (67.6 kg)  06/06/24 156 lb (70.8 kg)    GEN: Well nourished, well developed in no acute distress NECK: No JVD; No carotid bruits CARDIAC:  RRR, no murmurs, no rubs, no gallops RESPIRATORY:  Clear to auscultation without rales, wheezing or rhonchi  ABDOMEN: Soft, non-tender, non-distended EXTREMITIES: 1+ bilateral lower extremity pitting edema; No deformity   ASSESSMENT AND PLAN: .    Assessment and Plan Assessment & Plan Mild acute on chronic combined systolic and diastolic heart failure with non-ischemic cardiomyopathy with LBBB EF  25 to 30% with global hypokinesis in 2016 with grade 1 diastolic dysfunction; improved to 45-50% ejection fraction with mildly reduced RV systolic function.  Since EF had improved she  did not require CRT.  New bilateral foot swelling noted, more pronounced in one foot, without dyspnea. No recent arrhythmias on loop recorder. - Continue current GDMT: Farxiga  10 mg, metoprolol  succinate 25 mg, spironolactone  12.5 mg.   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  ILR implanted 06/24/2023 without any A-fib detected since implantation.  No prior TEE performed.  Currently on Plavix  75 mg and rosuvastatin  20 mg - TEE to complete cryptogenic stroke workup.  No relative or absolute contraindications.  Hypertension  LBBB  Insulin -dependent type 2 diabetes        Informed Consent   Shared Decision Making/Informed Consent   The risks [esophageal damage, perforation (1:10,000 risk), bleeding, pharyngeal hematoma as well as other potential complications associated with conscious sedation including aspiration, arrhythmia, respiratory failure and death], benefits (treatment guidance and diagnostic support) and alternatives of a transesophageal echocardiogram were discussed in detail with Ms. Locher and she is willing to proceed.        Follow up: 3 months  Signed, Emeline FORBES Calender, DO  07/20/2024 9:44 AM    [facility] "

## 2024-07-20 NOTE — Patient Instructions (Addendum)
 Medication Instructions:  TAKE LASIX  20 MG AS NEEDED *If you need a refill on your cardiac medications before your next appointment, please call your pharmacy*  Lab Work: CBC AND BMET PRIOR TO PROCEDURE If you have labs (blood work) drawn today and your tests are completely normal, you will receive your results only by: MyChart Message (if you have MyChart) OR A paper copy in the mail If you have any lab test that is abnormal or we need to change your treatment, we will call you to review the results.  Testing/Procedures:[address] Your physician has requested that you have an echocardiogram. Echocardiography is a painless test that uses sound waves to create images of your heart. It provides your doctor with information about the size and shape of your heart and how well your hearts chambers and valves are working. This procedure takes approximately one hour. There are no restrictions for this procedure. Please do NOT wear cologne, perfume, aftershave, or lotions (deodorant is allowed). Please arrive 15 minutes prior to your appointment time.  Please note: We ask at that you not bring children with you during ultrasound (echo/ vascular) testing. Due to room size and safety concerns, children are not allowed in the ultrasound rooms during exams. Our front office staff cannot provide observation of children in our lobby area while testing is being conducted. An adult accompanying a patient to their appointment will only be allowed in the ultrasound room at the discretion of the ultrasound technician under special circumstances. We apologize for any inconvenience.          Dear [name]  You are scheduled for a TEE (Transesophageal Echocardiogram) on Friday, February 6 with Dr. Mona.  Please arrive at the [facility] (Main Entrance A) at [facility]: [address] [place], [place] [address] at 7:30 AM (This time is 1 hour(s) before your procedure to ensure your  preparation).   Free valet parking service is available. You will check in at ADMITTING.   *Please Note: You will receive a call the day before your procedure to confirm the appointment time. That time may have changed from the original time based on the schedule for that day.*   DIET:  Nothing to eat or drink after midnight except a sip of water with medications (see medication instructions below)  MEDICATION INSTRUCTIONS: !!IF ANY NEW MEDICATIONS ARE STARTED AFTER TODAY, PLEASE NOTIFY YOUR PROVIDER AS SOON AS POSSIBLE!!  FYI: Medications such as Semaglutide (Ozempic, Wegovy), Tirzepatide (Mounjaro, Zepbound), Dulaglutide  (Trulicity ), etc (GLP1 agonists) AND Canagliflozin (Invokana), Dapagliflozin  (Farxiga ), Empagliflozin (Jardiance), Ertugliflozin (Steglatro), Bexagliflozin [employer]) or any combination with one of these drugs such as Invokamet (Canagliflozin/Metformin ), Synjardy (Empagliflozin/Metformin ), etc (SGLT2 inhibitors) must be held around the time of a procedure. This is not a comprehensive list of all of these drugs. Please review all of your medications and talk to your provider if you take any one of these. If you are not sure, ask your provider.    HOLD EVENING DOSE OF INSULIN  THE NIGHT BEFORE PROCEDURE HOLD: Dapagliflozin  (Farxiga ) for 2 days prior to the procedure. Last dose on Tuesday, February 04.   LABS: SHOULD BE DRAWN PRIOR TO PROCEDURE  FYI:  For your safety, and to allow us  to monitor your vital signs accurately during the surgery/procedure we request: If you have artificial nails, gel coating, SNS etc, please have those removed prior to your surgery/procedure. Not having the nail coverings /polish removed may result in cancellation or delay of your surgery/procedure.  Your support person will be asked to wait in the waiting room during your procedure.  It is OK to have someone drop you off and come back when you are ready to be discharged.  You cannot drive after  the procedure and will need someone to drive you home.  Bring your insurance cards.  *Special Note: Every effort is made to have your procedure done on time. Occasionally there are emergencies that occur at the hospital that may cause delays. Please be patient if a delay does occur.      Follow-Up: At [facility], you and your health needs are our priority.  As part of our continuing mission to provide you with exceptional heart care, our providers are all part of one team.  This team includes your primary Cardiologist (physician) and Advanced Practice Providers or APPs (Physician Assistants and Nurse Practitioners) who all work together to provide you with the care you need, when you need it.  Your next appointment:   3 month(s)  Provider:   Emeline FORBES Calender, DO    Other Instructions

## 2024-07-22 ENCOUNTER — Ambulatory Visit: Payer: Self-pay | Admitting: Cardiology

## 2024-07-25 ENCOUNTER — Ambulatory Visit (HOSPITAL_COMMUNITY)

## 2024-07-27 ENCOUNTER — Ambulatory Visit

## 2024-07-27 ENCOUNTER — Telehealth: Payer: Self-pay | Admitting: Internal Medicine

## 2024-07-27 NOTE — Telephone Encounter (Signed)
 Patient is calling to have her procedure that on 2/6 to be move to a later date. Due to have to r/s her echo (2/26). Please advise

## 2024-07-27 NOTE — Telephone Encounter (Signed)
 Spoke with patient to reschedule TEE. Pt confirmed 08/07/24 would work for her schedule. Spoke with [employer] 08/07/24 with Dr Loni at 1000.   You are scheduled for a TEE (Transesophageal Echocardiogram) on 08/07/24 with Dr. Matt at 10:00am.  Please arrive at the [facility] (Main Entrance A) at [facility]: [address] [place], [place] [address] at 9 am. (This is one hour before your procedure to ensure your preparation.  Free valet parking is available. You will check in at ADMITTING.   *Please Note: You will receive a call the day before your procedure to confirm the appointment time. That time may have changed from the original time based on the schedule for that day.*   DIET: Nothing to eat or drink after midnight except a sip of water with medications (see medication instructions below)  MEDICATION INSTRUCTIONS: !!IF ANY NEW MEDICATIONS ARE STARTED AFTER TODAY, PLEASE NOTIFY YOUR PROVIDER AS SOON AS POSSIBLE!!  FYI: Medications such as Semaglutide (Ozempic, Wegovy), Tirzepatide (Mounjaro, Zepbound), Dulaglutide  (Trulicity ), etc (GLP1 agonists) AND Canagliflozin (Invokana), Dapagliflozin  (Farxiga ), Empagliflozin (Jardiance), Ertugliflozin (Steglatro), Bexagliflozin [employer]) or any combination with one of these drugs such as Invokamet (Canagliflozin/Metformin ), Synjardy (Empagliflozin/Metformin ), etc (SGLT2 inhibitors) must be held around the time of a procedure. This is not a comprehensive list of all of these drugs. Please review all of your medications and talk to your provider if you take any one of these. If you are not sure, ask your provider.      HOLD EVENING DOSE OF INSULIN  THE NIGHT BEFORE PROCEDURE  HOLD: Dapagliflozin  (Farxiga ) for 2 days prior to the procedure. Last dose on Sunday, February 08.    LABS: SHOULD BE DRAWN PRIOR TO PROCEDURE   FYI:  For your safety, and to allow us  to monitor your vital signs accurately during the surgery/procedure  we request: If you have artificial nails, gel coating, SNS etc, please have those removed prior to your surgery/procedure. Not having the nail coverings /polish removed may result in cancellation or delay of your surgery/procedure.   Your support person will be asked to wait in the waiting room during your procedure.  It is OK to have someone drop you off and come back when you are ready to be discharged.  You cannot drive after the procedure and will need someone to drive you home.   Bring your insurance cards.   *Special Note: Every effort is made to have your procedure done on time. Occasionally there are emergencies that occur at the hospital that may cause delays. Please be patient if a delay does occur.     Spoke with patient, Reviewed instructions. Also educated patient that I would upload these instructions to her MyChart. Verbalizes understanding of plan and reminded to get blood work within 1 week of procedure.

## 2024-07-28 ENCOUNTER — Other Ambulatory Visit: Payer: Self-pay | Admitting: Family Medicine

## 2024-07-28 ENCOUNTER — Other Ambulatory Visit: Payer: Self-pay | Admitting: Radiology

## 2024-07-28 DIAGNOSIS — G4700 Insomnia, unspecified: Secondary | ICD-10-CM

## 2024-07-28 DIAGNOSIS — N8110 Cystocele, unspecified: Secondary | ICD-10-CM

## 2024-07-30 ENCOUNTER — Ambulatory Visit: Admitting: Family Medicine

## 2024-07-30 ENCOUNTER — Telehealth: Payer: Self-pay | Admitting: Family Medicine

## 2024-07-30 NOTE — Telephone Encounter (Signed)
 Contacted pt left vm to resch appt (office closed)  if pt calls back please resch appt

## 2024-07-31 NOTE — Telephone Encounter (Signed)
 Med refill request: premarin  vaginal cream Last OV: 01/26/24 (needed follow up 2 weeks) Last AEX: none Next AEX: none scheduled Last MMG (if hormonal med) 10/27/22 BI-RADS 1 negative Last refill: 04/24/2024 Refill denied.  Needs appointment. Message sent to front desk to schedule AEX.

## 2024-08-02 ENCOUNTER — Ambulatory Visit: Admitting: Obstetrics and Gynecology

## 2024-08-03 ENCOUNTER — Telehealth: Payer: Self-pay | Admitting: Internal Medicine

## 2024-08-03 ENCOUNTER — Ambulatory Visit: Admitting: Radiology

## 2024-08-03 NOTE — Telephone Encounter (Signed)
 This patient is calling to reschedule TRANSESOPHAGEAL ECHOCARDIOGRAM procedure

## 2024-08-07 ENCOUNTER — Encounter (HOSPITAL_COMMUNITY): Admission: RE | Payer: Self-pay

## 2024-08-07 ENCOUNTER — Ambulatory Visit (HOSPITAL_COMMUNITY): Admission: RE | Admit: 2024-08-07 | Admitting: Internal Medicine

## 2024-08-13 ENCOUNTER — Ambulatory Visit: Admitting: Obstetrics and Gynecology

## 2024-08-15 ENCOUNTER — Ambulatory Visit

## 2024-08-23 ENCOUNTER — Ambulatory Visit (HOSPITAL_COMMUNITY)

## 2024-09-10 ENCOUNTER — Ambulatory Visit: Admitting: Radiology

## 2024-09-12 ENCOUNTER — Ambulatory Visit: Admitting: Physician Assistant

## 2024-09-15 ENCOUNTER — Ambulatory Visit

## 2024-10-16 ENCOUNTER — Ambulatory Visit

## 2024-10-18 ENCOUNTER — Ambulatory Visit: Admitting: Internal Medicine

## 2024-11-16 ENCOUNTER — Ambulatory Visit

## 2024-12-17 ENCOUNTER — Ambulatory Visit

## 2025-01-09 ENCOUNTER — Ambulatory Visit: Admitting: Neurology

## 2025-01-17 ENCOUNTER — Ambulatory Visit

## 2025-02-17 ENCOUNTER — Ambulatory Visit

## 2025-03-20 ENCOUNTER — Ambulatory Visit

## 2025-04-20 ENCOUNTER — Ambulatory Visit
